# Patient Record
Sex: Female | Born: 1985 | Race: Black or African American | Hispanic: No | Marital: Single | Smoking: Never smoker
Health system: Southern US, Community
[De-identification: ages and names within clinical notes are randomized; demographics above are authoritative.]

## PROBLEM LIST (undated history)

## (undated) ENCOUNTER — Inpatient Hospital Stay (HOSPITAL_COMMUNITY): Payer: Self-pay

## (undated) DIAGNOSIS — Z98891 History of uterine scar from previous surgery: Secondary | ICD-10-CM

## (undated) DIAGNOSIS — I219 Acute myocardial infarction, unspecified: Secondary | ICD-10-CM

## (undated) DIAGNOSIS — I2510 Atherosclerotic heart disease of native coronary artery without angina pectoris: Secondary | ICD-10-CM

## (undated) DIAGNOSIS — I509 Heart failure, unspecified: Secondary | ICD-10-CM

## (undated) DIAGNOSIS — E785 Hyperlipidemia, unspecified: Secondary | ICD-10-CM

## (undated) DIAGNOSIS — I10 Essential (primary) hypertension: Secondary | ICD-10-CM

## (undated) DIAGNOSIS — E039 Hypothyroidism, unspecified: Secondary | ICD-10-CM

## (undated) DIAGNOSIS — O119 Pre-existing hypertension with pre-eclampsia, unspecified trimester: Secondary | ICD-10-CM

## (undated) DIAGNOSIS — E119 Type 2 diabetes mellitus without complications: Secondary | ICD-10-CM

## (undated) DIAGNOSIS — O24419 Gestational diabetes mellitus in pregnancy, unspecified control: Secondary | ICD-10-CM

## (undated) DIAGNOSIS — B009 Herpesviral infection, unspecified: Secondary | ICD-10-CM

## (undated) DIAGNOSIS — O10019 Pre-existing essential hypertension complicating pregnancy, unspecified trimester: Secondary | ICD-10-CM

## (undated) DIAGNOSIS — I252 Old myocardial infarction: Secondary | ICD-10-CM

## (undated) HISTORY — DX: Gestational diabetes mellitus in pregnancy, unspecified control: O24.419

## (undated) HISTORY — PX: NO PAST SURGERIES: SHX2092

---
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
## 2006-04-11 ENCOUNTER — Ambulatory Visit: Payer: Self-pay | Admitting: Family Medicine

## 2006-05-17 ENCOUNTER — Ambulatory Visit: Payer: Self-pay | Admitting: Family Medicine

## 2006-07-15 ENCOUNTER — Ambulatory Visit: Payer: Self-pay | Admitting: Family Medicine

## 2006-10-31 DIAGNOSIS — E282 Polycystic ovarian syndrome: Secondary | ICD-10-CM | POA: Insufficient documentation

## 2006-10-31 DIAGNOSIS — I119 Hypertensive heart disease without heart failure: Secondary | ICD-10-CM | POA: Insufficient documentation

## 2006-12-19 ENCOUNTER — Encounter (INDEPENDENT_AMBULATORY_CARE_PROVIDER_SITE_OTHER): Payer: Self-pay | Admitting: Family Medicine

## 2006-12-19 ENCOUNTER — Ambulatory Visit: Payer: Self-pay | Admitting: Family Medicine

## 2006-12-19 ENCOUNTER — Encounter: Payer: Self-pay | Admitting: *Deleted

## 2006-12-19 DIAGNOSIS — R8789 Other abnormal findings in specimens from female genital organs: Secondary | ICD-10-CM | POA: Insufficient documentation

## 2006-12-19 DIAGNOSIS — E039 Hypothyroidism, unspecified: Secondary | ICD-10-CM | POA: Insufficient documentation

## 2006-12-19 HISTORY — DX: Hypothyroidism, unspecified: E03.9

## 2006-12-19 LAB — CONVERTED CEMR LAB
BUN: 15 mg/dL (ref 6–23)
Beta hcg, urine, semiquantitative: NEGATIVE
Bilirubin Urine: NEGATIVE
CO2: 24 meq/L (ref 19–32)
Calcium: 8.8 mg/dL (ref 8.4–10.5)
Chloride: 103 meq/L (ref 96–112)
Creatinine, Ser: 0.7 mg/dL (ref 0.40–1.20)
Glucose, Bld: 155 mg/dL — ABNORMAL HIGH (ref 70–99)
Glucose, Urine, Semiquant: NEGATIVE
Hgb A1c MFr Bld: 7.3 %
Ketones, urine, test strip: NEGATIVE
Nitrite: NEGATIVE
Potassium: 4.1 meq/L (ref 3.5–5.3)
Protein, U semiquant: NEGATIVE
Sodium: 139 meq/L (ref 135–145)
Specific Gravity, Urine: 1.02
TSH: 3.9 microintl units/mL (ref 0.350–5.50)
Urobilinogen, UA: 2
pH: 7

## 2006-12-23 ENCOUNTER — Telehealth (INDEPENDENT_AMBULATORY_CARE_PROVIDER_SITE_OTHER): Payer: Self-pay | Admitting: *Deleted

## 2006-12-27 ENCOUNTER — Telehealth (INDEPENDENT_AMBULATORY_CARE_PROVIDER_SITE_OTHER): Payer: Self-pay | Admitting: Family Medicine

## 2007-01-21 ENCOUNTER — Encounter: Payer: Self-pay | Admitting: *Deleted

## 2008-06-11 ENCOUNTER — Encounter: Payer: Self-pay | Admitting: Family Medicine

## 2010-10-03 NOTE — Assessment & Plan Note (Signed)
Summary: CPP/PAP/EL   Vital Signs:  Patient Profile:   25 Years Old Female Height:     62.5 inches (158.75 cm) Weight:      360 pounds (163.64 kg) BMI:     65.03 Temp:     97.2 degrees F (36.22 degrees C) Pulse rate:   126 / minute BP sitting:   156 / 94  Pt. in pain?   no  Vitals Entered By: Tomasa Rand (December 19, 2006 1:36 PM)                History of Present Illness: 25 yo with PCOS, DMT2, HTN who presents today for physical and PAP.  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  She never followed up for colposcopy.  She is sexually active and and states that her LMP was 11/13/06.  She admits to unprotected intercourse and has been tested for GC/CHL/ HIV/RPR 07/2006 with neagtive results.  She denies vaginal discharge today, abdominal pain or bleeding.  She was placed on OCP for PCOS and has been noncompliant with them.  DMT2- last AIC 6.4 07/2006.  She was started on metformin 500 mg daily, but has not been taking it consistently.  She was sent for nutrition eval, but still leads sedentary lifestyle.  She has family history of DMT2 in her mother.  PCOS- Elevated testosterone 49 in 07/2006.  DHEA nml. LH: FSH ratio 1.5.  See management initiated as above.   HTN- stable on HCTZ 25.  Needs to start ACE, but unwilling at this time.    Subclinical hypothyroid- Found 07/2006 for amenorrhea work-up.  TSH 8.  Free T4 nml.       Family History:    Mother morbidly obese/ DM  Social History:    Pt dropped out of school.  (C's, D's and 1 F).  Denies tob, etoh, drug use.  Has friends who drink alcohol. Has 2 brothers who live at home.     Risk Factors:  Tobacco use:  never    Physical Exam  General:     overweight-appearing.   Lungs:     Normal respiratory effort, chest expands symmetrically. Lungs are clear to auscultation, no crackles or wheezes. Heart:     Normal rate and regular rhythm. S1 and S2 normal without gallop, murmur, click, rub or other extra sounds.  Distant S1 and  S2 Abdomen:     obese.soft, non-tender, and normal bowel sounds.      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  Pt missed follow up for colpo.  Needs colpo.  GC/ CHL/ HIV/RPR testing normal.  Counselled on importance of follow up to prevent progression of cervical CA.  Counselled regarding use of condoms.  Qualifies for gardisil.  Discuss at next visit.   Orders: [facility]- Est  Level 4 (84696)   Problem # 2:  POLYCYSTIC OVARIAN DISEASE (ICD-256.4) Assessment: Unchanged Needs to take ortho-cylen consistently.  Pt agrees to restart.  States she will incorporate this into her daily routine to not miss doses.  U- preg negative in office today.  Warned of risk of endometrial hyperplasia with not having menses.  Pt voiced understanding.  Cont to encourage weight loss.  Metformin started for PCOS and DM.   Orders: U Preg-[facility] (81025)   Problem # 3:  DM, UNCOMPLICATED, TYPE II (ICD-250.00) Assessment: Deteriorated AIC today 7.3 Increased from 6.4.  Needs to take metformin 500 mg daily as prescribed.  Started low dose to avoid GI side effects.  Likely will need titration.  Told of weight loss side effects with metformin.  Cont to encourage life style modification.  Needs CHOL screen next visit when fasting in future.   Orders: [facility]- Est  Level 4 (99214) A1C-[facility] (44010) Basic Met-[facility] (27253-66440) UA Microalbumin-[facility] (34742)   Problem # 4:  HYPERTENSION, BENIGN (ICD-401.1) Assessment: Deteriorated Will switch HCTZ to ACE next visit.  Will obtain consent to treat since pt has been noncompliant with OCP in past.  Will warn of teratogenic side effects.  Check UA for protein,  if negative microalbumin.  Follow up TSH to assure not contributing.  Will need OSA eval at some point.   Orders: Basic Met-[facility] (59563-87564)   Problem # 5:  HYPOTHYROIDISM, BORDERLINE (ICD-244.9) Assessment: Unchanged TSH 8 / free t4 1.14.   Will recheck TSH today to rule out eusick thyroid since it has been greater than 6 weeks since previous test.   Orders: [facility]- Est  Level 4 (33295) TSH-[facility] (18841-66063)   Problem # 6:  Preventive Health Care (ICD-V70.0) See above.  Qualifies gardisil/ Needs FLP.  Colpo needed.  Seperate visit scheduled for this.    Other Orders: Urinalysis-[facility] (00000)   Patient Instructions: 1)  Please schedule a follow-up appointment in 3 months. 2)  Have Follow up scheduled  for colpo in the colpo clinic prior to next visit.   3)  Take all medicines as prescribed.   4)  Continue weight loss to assist with your blood pressure.  5)  If no improvement prior to next visit, you may require additional BP medicines. 6)  We will contact you with any abnormal results  Laboratory Results   Urine Tests  Date/Time Recieved: December 19, 2006 2:26 PM  Date/Time Reported: December 19, 2006 2:48 PM   Routine Urinalysis   Color: yellow Appearance: Clear Glucose: negative   (Normal Range: Negative) Bilirubin: negative   (Normal Range: Negative) Ketone: negative   (Normal Range: Negative) Spec. Gravity: 1.020   (Normal Range: 1.003-1.035) Blood: trace-lysed   (Normal Range: Negative) pH: 7.0   (Normal Range: 5.0-8.0) Protein: negative   (Normal Range: Negative) Urobilinogen: 2.0   (Normal Range: 0-1) Nitrite: negative   (Normal Range: Negative) Leukocyte Esterace: trace   (Normal Range: Negative)  Urine Microscopic WBC/hpf: 1-5 RBC/hpf: 0-3 Bacteria: 3+ rods Epithelial: 10-15 Other: occ clue cell    Urine HCG: negative Comments: ...................................................................DONNA [facility]  December 19, 2006 2:48 PM   Blood Tests   Date/Time Recieved: December 19, 2006 2:26 PM  Date/Time Reported: December 19, 2006 2:42 PM   HGBA1C: 7.3%   (Normal Range: Non-Diabetic - 3-6%   Control Diabetic - 6-8%)  Comments: ...................................................................DONNA  [facility]  December 19, 2006 2:43 PM

## 2010-10-03 NOTE — Letter (Signed)
Summary: Handout Printed  Printed Handout:  - Family Medicine Patient Instructions 

## 2010-10-03 NOTE — Progress Notes (Signed)
Summary: Colpo appointment  Phone Note Call from Patient Call back at [phone]   Reason for Call: Talk to Nurse Summary of Call: pt is checking the status of her appt with colpo clinic Initial call taken by: ERIN LEVAN,  December 23, 2006 3:26 PM  Follow-up for Phone Call        COLPO APPT SCHEDULED Jan 14, 2007 @ 9:00 AM PT NOTIFIED VIA PHONE AND IS AGREEABLE. ADVISED I WOULD SEND COLPO PROCEDURE INFO/REMINDER APPT CARD VIA MAIL. Follow-up by: Dedra Skeens CMA,,  December 24, 2006 1:36 PM    Appended Document: Colpo appointment Pt [facility] on 01/13/07, needs repeat PAP, not colpo for CIN 1based on new ACOG guidelines and fasting 17 -OH to eval for secondary adrenal hyperplasia at next video.  Maxwell Marion, MD  Appended Document: Colpo appointment Add prolactin level if amenorrhea persist with fasting 17-OH at next visit. Maxwell Marion, MD

## 2010-10-03 NOTE — Assessment & Plan Note (Signed)
Summary: Nurse visit BP check  Nurse Visit   Vital Signs:  Patient Profile:   25 Years Old Female Height:     62.5 inches (158.75 cm) Pulse rate:   102 / minute BP sitting:   150 / 93  (left arm)

## 2014-03-04 LAB — OB RESULTS CONSOLE RUBELLA ANTIBODY, IGM: Rubella: NON-IMMUNE/NOT IMMUNE

## 2014-03-04 LAB — OB RESULTS CONSOLE HIV ANTIBODY (ROUTINE TESTING): HIV: NONREACTIVE

## 2014-03-04 LAB — OB RESULTS CONSOLE HEPATITIS B SURFACE ANTIGEN: Hepatitis B Surface Ag: NEGATIVE

## 2014-03-04 LAB — OB RESULTS CONSOLE RPR: RPR: NONREACTIVE

## 2014-03-04 LAB — OB RESULTS CONSOLE GC/CHLAMYDIA
Chlamydia: NEGATIVE
Gonorrhea: NEGATIVE

## 2014-04-07 ENCOUNTER — Encounter: Payer: Medicaid Other | Attending: Obstetrics and Gynecology

## 2014-04-07 VITALS — Ht 62.0 in | Wt 290.7 lb

## 2014-04-07 DIAGNOSIS — Z713 Dietary counseling and surveillance: Secondary | ICD-10-CM | POA: Insufficient documentation

## 2014-04-07 DIAGNOSIS — E119 Type 2 diabetes mellitus without complications: Secondary | ICD-10-CM | POA: Diagnosis not present

## 2014-04-07 DIAGNOSIS — O24919 Unspecified diabetes mellitus in pregnancy, unspecified trimester: Secondary | ICD-10-CM | POA: Insufficient documentation

## 2014-04-07 DIAGNOSIS — O99810 Abnormal glucose complicating pregnancy: Secondary | ICD-10-CM

## 2014-04-09 NOTE — Progress Notes (Signed)
  Patient was seen on 04/07/14 for Gestational Diabetes self-management class at [facility]. The following learning objectives were met by the patient during this course:   States the definition of Gestational Diabetes  States why dietary management is important in controlling blood glucose  Describes the effects of carbohydrates on blood glucose levels  Demonstrates ability to create a balanced meal plan  Demonstrates carbohydrate counting   States when to check blood glucose levels  Demonstrates proper blood glucose monitoring techniques  States the effect of stress and exercise on blood glucose levels  States the importance of limiting caffeine and abstaining from alcohol and smoking  Plan:  Aim for 2 Carb Choices per meal (30 grams) +/- 1 either way for breakfast Aim for 3 Carb Choices per meal (45 grams) +/- 1 either way from lunch and dinner Aim for 1-2 Carbs per snack Begin reading food labels for Total Carbohydrate and sugar grams of foods Consider  increasing your activity level by walking daily as tolerated Begin checking BG before breakfast and 2 hours after first bit of breakfast, lunch and dinner after  as directed by MD  Take medication  as directed by MD  Blood glucose monitor given: Accu Chek Nano BG Monitoring Kit Lot # [id number] Exp: 12/02/14 Blood glucose reading: $RemoveBeforeDE'239mg'[id number]$ /dl    Patient will modify dietary intake, monitor glucose and see MD Monday 04/12/14. Advised to contact MD office on Friday 04/09/14 if readings remain greater than directed parameters.  Patient instructed to monitor glucose levels: FBS: 60 - <90 2 hour: <120  Patient received the following handouts:  Nutrition Diabetes and Pregnancy  Carbohydrate Counting List  Meal Planning worksheet  Patient will be seen for follow-up as needed.

## 2014-04-30 ENCOUNTER — Encounter (HOSPITAL_COMMUNITY): Payer: Self-pay | Admitting: *Deleted

## 2014-04-30 ENCOUNTER — Ambulatory Visit: Payer: Medicaid Other | Admitting: Internal Medicine

## 2014-04-30 ENCOUNTER — Inpatient Hospital Stay (HOSPITAL_COMMUNITY): Payer: Medicaid Other

## 2014-04-30 ENCOUNTER — Inpatient Hospital Stay (HOSPITAL_COMMUNITY)
Admission: AD | Admit: 2014-04-30 | Discharge: 2014-04-30 | Disposition: A | Discharge status: Home / Self Care | Payer: Medicaid Other | Source: Ambulatory Visit | Attending: Obstetrics and Gynecology | Admitting: Obstetrics and Gynecology

## 2014-04-30 DIAGNOSIS — H538 Other visual disturbances: Secondary | ICD-10-CM | POA: Diagnosis not present

## 2014-04-30 DIAGNOSIS — R220 Localized swelling, mass and lump, head: Secondary | ICD-10-CM | POA: Diagnosis not present

## 2014-04-30 DIAGNOSIS — Z3482 Encounter for supervision of other normal pregnancy, second trimester: Secondary | ICD-10-CM

## 2014-04-30 DIAGNOSIS — O368120 Decreased fetal movements, second trimester, not applicable or unspecified: Secondary | ICD-10-CM

## 2014-04-30 DIAGNOSIS — O9989 Other specified diseases and conditions complicating pregnancy, childbirth and the puerperium: Principal | ICD-10-CM

## 2014-04-30 DIAGNOSIS — R42 Dizziness and giddiness: Secondary | ICD-10-CM | POA: Insufficient documentation

## 2014-04-30 DIAGNOSIS — R221 Localized swelling, mass and lump, neck: Secondary | ICD-10-CM | POA: Insufficient documentation

## 2014-04-30 DIAGNOSIS — O99891 Other specified diseases and conditions complicating pregnancy: Secondary | ICD-10-CM | POA: Diagnosis not present

## 2014-04-30 DIAGNOSIS — R51 Headache: Secondary | ICD-10-CM | POA: Diagnosis not present

## 2014-04-30 LAB — COMPREHENSIVE METABOLIC PANEL
ALT: 6 U/L (ref 0–35)
AST: 8 U/L (ref 0–37)
Albumin: 3 g/dL — ABNORMAL LOW (ref 3.5–5.2)
Alkaline Phosphatase: 66 U/L (ref 39–117)
Anion gap: 12 (ref 5–15)
BUN: 8 mg/dL (ref 6–23)
CO2: 23 mEq/L (ref 19–32)
Calcium: 9.3 mg/dL (ref 8.4–10.5)
Chloride: 97 mEq/L (ref 96–112)
Creatinine, Ser: 0.45 mg/dL — ABNORMAL LOW (ref 0.50–1.10)
GFR calc Af Amer: >90 mL/min (ref >90)
GFR calc non Af Amer: >90 mL/min (ref >90)
Glucose, Bld: 136 mg/dL — ABNORMAL HIGH (ref 70–99)
Potassium: 3.6 mEq/L — ABNORMAL LOW (ref 3.7–5.3)
Sodium: 132 mEq/L — ABNORMAL LOW (ref 137–147)
Total Bilirubin: 0.2 mg/dL — ABNORMAL LOW (ref 0.3–1.2)
Total Protein: 7.7 g/dL (ref 6.0–8.3)

## 2014-04-30 LAB — CBC WITH DIFFERENTIAL/PLATELET
Basophils Absolute: 0 10*3/uL (ref 0.0–0.1)
Basophils Relative: 0 % (ref 0–1)
Eosinophils Absolute: 0 10*3/uL (ref 0.0–0.7)
Eosinophils Relative: 0 % (ref 0–5)
HCT: 33.5 % — ABNORMAL LOW (ref 36.0–46.0)
Hemoglobin: 11.5 g/dL — ABNORMAL LOW (ref 12.0–15.0)
Lymphocytes Relative: 13 % (ref 12–46)
Lymphs Abs: 1.4 10*3/uL (ref 0.7–4.0)
MCH: 29.6 pg (ref 26.0–34.0)
MCHC: 34.3 g/dL (ref 30.0–36.0)
MCV: 86.1 fL (ref 78.0–100.0)
Monocytes Absolute: 0.6 10*3/uL (ref 0.1–1.0)
Monocytes Relative: 5 % (ref 3–12)
Neutro Abs: 9.4 10*3/uL — ABNORMAL HIGH (ref 1.7–7.7)
Neutrophils Relative %: 82 % — ABNORMAL HIGH (ref 43–77)
Platelets: 335 10*3/uL (ref 150–400)
RBC: 3.89 MIL/uL (ref 3.87–5.11)
RDW: 12.7 % (ref 11.5–15.5)
WBC: 11.5 10*3/uL — ABNORMAL HIGH (ref 4.0–10.5)

## 2014-04-30 LAB — LACTATE DEHYDROGENASE: LDH: 134 U/L (ref 94–250)

## 2014-04-30 LAB — PROTEIN / CREATININE RATIO, URINE
Creatinine, Urine: 161.12 mg/dL
Protein Creatinine Ratio: 0.11 (ref 0.00–0.15)
Total Protein, Urine: 17.1 mg/dL

## 2014-04-30 LAB — GLUCOSE, CAPILLARY: Glucose-Capillary: 132 mg/dL — ABNORMAL HIGH (ref 70–99)

## 2014-04-30 LAB — URIC ACID: Uric Acid, Serum: 3.2 mg/dL (ref 2.4–7.0)

## 2014-04-30 MED ORDER — ACETAMINOPHEN 325 MG PO TABS
650.0000 mg | ORAL_TABLET | Freq: Once | ORAL | Status: AC
  Administered 2014-04-30: 650 mg via ORAL
  Filled 2014-04-30: qty 2

## 2014-04-30 NOTE — Discharge Instructions (Signed)

## 2014-04-30 NOTE — MAU Note (Signed)
C/O having a cold x 1 week, has been taking tylenol & sudafed, cough drops - woke up this morning with R side of face swollen & has pressure.  Vision in R eye is blurry.  C/O dizziness anytime she is up & moving.  HA for last 2 days.  Denies pain, bleeding or discharge.

## 2014-04-30 NOTE — MAU Provider Note (Signed)
Results for orders placed during the hospital encounter of 04/30/14 (from the past 24 hour(s))  CBC WITH DIFFERENTIAL     Status: Abnormal   Collection Time    04/30/14  4:34 PM      Result Value Ref Range   WBC 11.5 (*) 4.0 - 10.5 K/uL   RBC 3.89  3.87 - 5.11 MIL/uL   Hemoglobin 11.5 (*) 12.0 - 15.0 g/dL   HCT 33.5 (*) 36.0 - 46.0 %   MCV 86.1  78.0 - 100.0 fL   MCH 29.6  26.0 - 34.0 pg   MCHC 34.3  30.0 - 36.0 g/dL   RDW 12.7  11.5 - 15.5 %   Platelets 335  150 - 400 K/uL   Neutrophils Relative % 82 (*) 43 - 77 %   Neutro Abs 9.4 (*) 1.7 - 7.7 K/uL   Lymphocytes Relative 13  12 - 46 %   Lymphs Abs 1.4  0.7 - 4.0 K/uL   Monocytes Relative 5  3 - 12 %   Monocytes Absolute 0.6  0.1 - 1.0 K/uL   Eosinophils Relative 0  0 - 5 %   Eosinophils Absolute 0.0  0.0 - 0.7 K/uL   Basophils Relative 0  0 - 1 %   Basophils Absolute 0.0  0.0 - 0.1 K/uL  COMPREHENSIVE METABOLIC PANEL     Status: Abnormal   Collection Time    04/30/14  4:34 PM      Result Value Ref Range   Sodium 132 (*) 137 - 147 mEq/L   Potassium 3.6 (*) 3.7 - 5.3 mEq/L   Chloride 97  96 - 112 mEq/L   CO2 23  19 - 32 mEq/L   Glucose, Bld 136 (*) 70 - 99 mg/dL   BUN 8  6 - 23 mg/dL   Creatinine, Ser 0.45 (*) 0.50 - 1.10 mg/dL   Calcium 9.3  8.4 - 10.5 mg/dL   Total Protein 7.7  6.0 - 8.3 g/dL   Albumin 3.0 (*) 3.5 - 5.2 g/dL   AST 8  0 - 37 U/L   ALT 6  0 - 35 U/L   Alkaline Phosphatase 66  39 - 117 U/L   Total Bilirubin 0.2 (*) 0.3 - 1.2 mg/dL   GFR calc non Af Amer >90  >90 mL/min   GFR calc Af Amer >90  >90 mL/min   Anion gap 12  5 - 15  LACTATE DEHYDROGENASE     Status: None   Collection Time    04/30/14  4:34 PM      Result Value Ref Range   LDH 134  94 - 250 U/L  URIC ACID     Status: None   Collection Time    04/30/14  4:34 PM      Result Value Ref Range   Uric Acid, Serum 3.2  2.4 - 7.0 mg/dL  GLUCOSE, CAPILLARY     Status: Abnormal   Collection Time    04/30/14  4:34 PM      Result Value Ref Range    Glucose-Capillary 132 (*) 70 - 99 mg/dL  PROTEIN / CREATININE RATIO, URINE     Status: None   Collection Time    04/30/14  5:45 PM      Result Value Ref Range   Creatinine, Urine 161.12     Total Protein, Urine 17.1     PROTEIN CREATININE RATIO 0.11  0.00 - 0.15   [place] - anterior placenta, FHR 156,  cervix closed, cardiac activity seen, marginal cord insertion  DC to home  FU in the office in 2 week Tylenol for HA   Yarithza Mink, CNM, MSN 04/30/2014. 6:27 PM

## 2014-04-30 NOTE — MAU Provider Note (Signed)
Lauren Delgado is a 28 y.o. G1P0 at 18.2 weeks presents to MAU C/O having a cold x 1 week, has been taking tylenol & sudafed, cough drops - woke up this morning with R side of face swollen & has pressure around the eye. Vision in R eye is blurry. C/O dizziness anytime she is up & moving. HA for last 2 days. Denies pain, bleeding or discharge.  No fetal movement last few days. States she has GDM on 2.5mg  glyburide and GHTN on labetalol 200mg  BID but take her morning dose today.   History     Patient Active Problem List   Diagnosis Date Noted  . HYPOTHYROIDISM, BORDERLINE 12/19/2006  . DM, UNCOMPLICATED, TYPE II 02/27/9484  . ABNORMAL PAP SMEAR, LGSIL 12/19/2006  . Polycystic Ovaries 10/31/2006  . OBESITY, NOS 10/31/2006  . Essential Hypertension, Benign 10/31/2006  . RHINITIS, ALLERGIC 10/31/2006  . AMENORRHEA 10/31/2006    Chief Complaint  Patient presents with  . Facial Swelling  . Dizziness  . Headache  . Blurred Vision   HPI  OB History   Grav Para Term Preterm Abortions TAB SAB Ect Mult Living   1               Past Medical History  Diagnosis Date  . Gestational diabetes mellitus, antepartum     History reviewed. No pertinent past surgical history.  History reviewed. No pertinent family history.  History  Substance Use Topics  . Smoking status: Never Smoker   . Smokeless tobacco: Not on file  . Alcohol Use: No    Allergies: No Known Allergies  Prescriptions prior to admission  Medication Sig Dispense Refill  . acetaminophen (TYLENOL) 500 MG tablet Take 500 mg by mouth every 6 (six) hours as needed for mild pain.      Marland Kitchen glyBURIDE (DIABETA) 2.5 MG tablet Take 2.5 mg by mouth at bedtime.      . hydrocortisone cream 1 % Apply 1 application topically daily as needed (rash).      . labetalol (NORMODYNE) 200 MG tablet Take 200 mg by mouth 2 (two) times daily.      Marland Kitchen omeprazole (PRILOSEC) 20 MG capsule Take 20 mg by mouth 2 (two) times daily as needed  (indigestion).      . Prenatal Multivit-Min-Fe-FA (PRENATAL VITAMINS PO) Take 1 each by mouth daily.      . Pseudoephedrine HCl (SUDAFED PO) Take 2 tablets by mouth every 6 (six) hours as needed (congestion).        ROS See HPI above, all other systems are negative  Physical Exam   Blood pressure 128/84, pulse 105, temperature 98.6 F (37 C), temperature source Oral, resp. rate 20, height 5\' 2"  (1.575 m), weight 284 lb (128.822 kg).  Physical Exam  Ext:  WNL, no edema ABD: Soft, non tender to palpation, no rebound or guarding SVE: deferred   ED Course  Assessment: IUP at  18.2 weeks Membranes:intact FHR: 160 CTX:  none POC CBG 132   Plan: Labs: CBC, CMP, LDH, Uric Acid, PCR UA [place] for viability    Mariyah Upshaw, CNM, MSN 04/30/2014. 4:08 PM

## 2014-05-01 LAB — URINE CULTURE: Colony Count: 50000

## 2014-05-17 ENCOUNTER — Ambulatory Visit: Payer: Medicaid Other | Admitting: Internal Medicine

## 2014-05-22 ENCOUNTER — Inpatient Hospital Stay (HOSPITAL_COMMUNITY): Payer: Medicaid Other

## 2014-05-22 ENCOUNTER — Encounter (HOSPITAL_COMMUNITY): Payer: Self-pay | Admitting: *Deleted

## 2014-05-22 ENCOUNTER — Inpatient Hospital Stay (HOSPITAL_COMMUNITY)
Admission: AD | Admit: 2014-05-22 | Discharge: 2014-05-22 | Disposition: A | Discharge status: Home / Self Care | Payer: Medicaid Other | Source: Ambulatory Visit | Attending: Obstetrics and Gynecology | Admitting: Obstetrics and Gynecology

## 2014-05-22 DIAGNOSIS — O99891 Other specified diseases and conditions complicating pregnancy: Secondary | ICD-10-CM | POA: Insufficient documentation

## 2014-05-22 DIAGNOSIS — R109 Unspecified abdominal pain: Secondary | ICD-10-CM | POA: Insufficient documentation

## 2014-05-22 DIAGNOSIS — O9989 Other specified diseases and conditions complicating pregnancy, childbirth and the puerperium: Secondary | ICD-10-CM

## 2014-05-22 DIAGNOSIS — O10019 Pre-existing essential hypertension complicating pregnancy, unspecified trimester: Secondary | ICD-10-CM | POA: Diagnosis not present

## 2014-05-22 DIAGNOSIS — R8271 Bacteriuria: Secondary | ICD-10-CM | POA: Insufficient documentation

## 2014-05-22 LAB — URINALYSIS, ROUTINE W REFLEX MICROSCOPIC
Bilirubin Urine: NEGATIVE
Glucose, UA: NEGATIVE mg/dL
Ketones, ur: NEGATIVE mg/dL
Nitrite: POSITIVE — AB
Protein, ur: NEGATIVE mg/dL
Specific Gravity, Urine: 1.01 (ref 1.005–1.030)
Urobilinogen, UA: 0.2 mg/dL (ref 0.0–1.0)
pH: 6 (ref 5.0–8.0)

## 2014-05-22 LAB — LIPASE, BLOOD: Lipase: 28 U/L (ref 11–59)

## 2014-05-22 LAB — URINE MICROSCOPIC-ADD ON

## 2014-05-22 LAB — COMPREHENSIVE METABOLIC PANEL
ALT: 11 U/L (ref 0–35)
AST: 11 U/L (ref 0–37)
Albumin: 2.8 g/dL — ABNORMAL LOW (ref 3.5–5.2)
Alkaline Phosphatase: 62 U/L (ref 39–117)
Anion gap: 13 (ref 5–15)
BUN: 7 mg/dL (ref 6–23)
CO2: 22 mEq/L (ref 19–32)
Calcium: 9.4 mg/dL (ref 8.4–10.5)
Chloride: 101 mEq/L (ref 96–112)
Creatinine, Ser: 0.53 mg/dL (ref 0.50–1.10)
GFR calc Af Amer: >90 mL/min (ref >90)
GFR calc non Af Amer: >90 mL/min (ref >90)
Glucose, Bld: 105 mg/dL — ABNORMAL HIGH (ref 70–99)
Potassium: 3.4 mEq/L — ABNORMAL LOW (ref 3.7–5.3)
Sodium: 136 mEq/L — ABNORMAL LOW (ref 137–147)
Total Bilirubin: 0.3 mg/dL (ref 0.3–1.2)
Total Protein: 7.4 g/dL (ref 6.0–8.3)

## 2014-05-22 LAB — PROTEIN / CREATININE RATIO, URINE
Creatinine, Urine: 116.1 mg/dL
Protein Creatinine Ratio: 0.23 — ABNORMAL HIGH (ref 0.00–0.15)
Total Protein, Urine: 26.8 mg/dL

## 2014-05-22 LAB — CBC
HCT: 30.2 % — ABNORMAL LOW (ref 36.0–46.0)
Hemoglobin: 10.2 g/dL — ABNORMAL LOW (ref 12.0–15.0)
MCH: 29.2 pg (ref 26.0–34.0)
MCHC: 33.8 g/dL (ref 30.0–36.0)
MCV: 86.5 fL (ref 78.0–100.0)
Platelets: 304 10*3/uL (ref 150–400)
RBC: 3.49 MIL/uL — ABNORMAL LOW (ref 3.87–5.11)
RDW: 13.5 % (ref 11.5–15.5)
WBC: 8.3 10*3/uL (ref 4.0–10.5)

## 2014-05-22 LAB — AMYLASE: Amylase: 70 U/L (ref 0–105)

## 2014-05-22 LAB — MAGNESIUM: Magnesium: 1.7 mg/dL (ref 1.5–2.5)

## 2014-05-22 LAB — URIC ACID: Uric Acid, Serum: 4.1 mg/dL (ref 2.4–7.0)

## 2014-05-22 LAB — LACTATE DEHYDROGENASE: LDH: 126 U/L (ref 94–250)

## 2014-05-22 MED ORDER — NITROFURANTOIN MONOHYD MACRO 100 MG PO CAPS: 100.0000 mg | ORAL_CAPSULE | Freq: Two times a day (BID) | ORAL | Status: AC

## 2014-05-22 NOTE — MAU Note (Signed)
Having really bad cramping right side back and stomach starting last night around 7.  Did not take any medications.  Denies bleeding, leaking of fluid, abnormal discharge.

## 2014-05-22 NOTE — Discharge Instructions (Signed)
Gestational Diabetes Mellitus Gestational diabetes mellitus, often simply referred to as gestational diabetes, is a type of diabetes that some women develop during pregnancy. In gestational diabetes, the pancreas does not make enough insulin (a hormone), the cells are less responsive to the insulin that is made (insulin resistance), or both.Normally, insulin moves sugars from food into the tissue cells. The tissue cells use the sugars for energy. The lack of insulin or the lack of normal response to insulin causes excess sugars to build up in the blood instead of going into the tissue cells. As a result, high blood sugar (hyperglycemia) develops. The effect of high sugar (glucose) levels can cause many problems.  RISK FACTORS You have an increased chance of developing gestational diabetes if you have a family history of diabetes and also have one or more of the following risk factors:  A body mass index over 30 (obesity).  A previous pregnancy with gestational diabetes.  An older age at the time of pregnancy. If blood glucose levels are kept in the normal range during pregnancy, women can have a healthy pregnancy. If your blood glucose levels are not well controlled, there may be risks to you, your unborn baby (fetus), your labor and delivery, or your newborn baby.  SYMPTOMS  If symptoms are experienced, they are much like symptoms you would normally expect during pregnancy. The symptoms of gestational diabetes include:   Increased thirst (polydipsia).  Increased urination (polyuria).  Increased urination during the night (nocturia).  Weight loss. This weight loss may be rapid.  Frequent, recurring infections.  Tiredness (fatigue).  Weakness.  Vision changes, such as blurred vision.  Fruity smell to your breath.  Abdominal pain. DIAGNOSIS Diabetes is diagnosed when blood glucose levels are increased. Your blood glucose level may be checked by one or more of the following blood  tests:  A fasting blood glucose test. You will not be allowed to eat for at least 8 hours before a blood sample is taken.  A random blood glucose test. Your blood glucose is checked at any time of the day regardless of when you ate.  A hemoglobin A1c blood glucose test. A hemoglobin A1c test provides information about blood glucose control over the previous 3 months.  An oral glucose tolerance test (OGTT). Your blood glucose is measured after you have not eaten (fasted) for 1-3 hours and then after you drink a glucose-containing beverage. Since the hormones that cause insulin resistance are highest at about 24-28 weeks of a pregnancy, an OGTT is usually performed during that time. If you have risk factors for gestational diabetes, your health care provider may test you for gestational diabetes earlier than 24 weeks of pregnancy. TREATMENT   You will need to take diabetes medicine or insulin daily to keep blood glucose levels in the desired range.  You will need to match insulin dosing with exercise and healthy food choices. The treatment goal is to maintain the before-meal (preprandial), bedtime, and overnight blood glucose level at 60-99 mg/dL during pregnancy. The treatment goal is to further maintain peak after-meal blood sugar (postprandial glucose) level at 100-140 mg/dL. HOME CARE INSTRUCTIONS   Have your hemoglobin A1c level checked twice a year.  Perform daily blood glucose monitoring as directed by your health care provider. It is common to perform frequent blood glucose monitoring.  Monitor urine ketones when you are ill and as directed by your health care provider.  Take your diabetes medicine and insulin as directed by your health care provider  to maintain your blood glucose level in the desired range.  Never run out of diabetes medicine or insulin. It is needed every day.  Adjust insulin based on your intake of carbohydrates. Carbohydrates can raise blood glucose levels but  need to be included in your diet. Carbohydrates provide vitamins, minerals, and fiber which are an essential part of a healthy diet. Carbohydrates are found in fruits, vegetables, whole grains, dairy products, legumes, and foods containing added sugars.  Eat healthy foods. Alternate 3 meals with 3 snacks.  Maintain a healthy weight gain. The usual total expected weight gain varies according to your prepregnancy body mass index (BMI).  Carry a medical alert card or wear your medical alert jewelry.  Carry a 15-gram carbohydrate snack with you at all times to treat low blood glucose (hypoglycemia). Some examples of 15-gram carbohydrate snacks include:  Glucose tablets, 3 or 4.  Glucose gel, 15-gram tube.  Raisins, 2 tablespoons (24 g).  Jelly beans, 6.  Animal crackers, 8.  Fruit juice, regular soda, or low-fat milk, 4 ounces (120 mL).  Gummy treats, 9.  Recognize hypoglycemia. Hypoglycemia during pregnancy occurs with blood glucose levels of 60 mg/dL and below. The risk for hypoglycemia increases when fasting or skipping meals, during or after intense exercise, and during sleep. Hypoglycemia symptoms can include:  Tremors or shakes.  Decreased ability to concentrate.  Sweating.  Increased heart rate.  Headache.  Dry mouth.  Hunger.  Irritability.  Anxiety.  Restless sleep.  Altered speech or coordination.  Confusion.  Treat hypoglycemia promptly. If you are alert and able to safely swallow, follow the 15:15 rule:  Take 15-20 grams of rapid-acting glucose or carbohydrate. Rapid-acting options include glucose gel, glucose tablets, or 4 ounces (120 mL) of fruit juice, regular soda, or low-fat milk.  Check your blood glucose level 15 minutes after taking the glucose.  Take 15-20 grams more of glucose if the repeat blood glucose level is still 70 mg/dL or below.  Eat a meal or snack within 1 hour once blood glucose levels return to normal.  Be alert to polyuria  (excess urination) and polydipsia (excess thirst) which are early signs of hyperglycemia. An early awareness of hyperglycemia allows for prompt treatment. Treat hyperglycemia as directed by your health care provider.  Engage in at least 30 minutes of physical activity a day or as directed by your health care provider. Ten minutes of physical activity timed 30 minutes after each meal is encouraged to control postprandial blood glucose levels.  Adjust your insulin dosing and food intake as needed if you start a new exercise or sport.  Follow your sick-day plan at any time you are unable to eat or drink as usual.  Avoid tobacco and alcohol use.  Keep all follow-up visits as directed by your health care provider.  Follow the advice of your health care provider regarding your prenatal and post-delivery (postpartum) appointments, meal planning, exercise, medicines, vitamins, blood tests, other medical tests, and physical activities.  Perform daily skin and foot care. Examine your skin and feet daily for cuts, bruises, redness, nail problems, bleeding, blisters, or sores.  Brush your teeth and gums at least twice a day and floss at least once a day. Follow up with your dentist regularly.  Schedule an eye exam during the first trimester of your pregnancy or as directed by your health care provider.  Share your diabetes management plan with your workplace or school.  Stay up-to-date with immunizations.  Learn to manage stress.  Obtain ongoing diabetes education and support as needed.  Learn about and consider breastfeeding your baby.  You should have your blood sugar level checked 6-12 weeks after delivery. This is done with an oral glucose tolerance test (OGTT). SEEK MEDICAL CARE IF:   You are unable to eat food or drink fluids for more than 6 hours.  You have nausea and vomiting for more than 6 hours.  You have a blood glucose level of 200 mg/dL and you have ketones in your  urine.  There is a change in mental status.  You develop vision problems.  You have a persistent headache.  You have upper abdominal pain or discomfort.  You develop an additional serious illness.  You have diarrhea for more than 6 hours.  You have been sick or have had a fever for a couple of days and are not getting better. SEEK IMMEDIATE MEDICAL CARE IF:   You have difficulty breathing.  You no longer feel the baby moving.  You are bleeding or have discharge from your vagina.  You start having premature contractions or labor. MAKE SURE YOU:  Understand these instructions.  Will watch your condition.  Will get help right away if you are not doing well or get worse. Document Released: 11/26/2000 Document Revised: 01/04/2014 Document Reviewed: 03/18/2012 [facility] Patient Information 2015 [place], [place]. This information is not intended to replace advice given to you by your health care provider. Make sure you discuss any questions you have with your health care provider. Hypertension During Pregnancy Hypertension, or high blood pressure, is when there is extra pressure inside your blood vessels that carry blood from the heart to the rest of your body (arteries). It can happen at any time in life, including pregnancy. Hypertension during pregnancy can cause problems for you and your baby. Your baby might not weigh as much as he or she should at birth or might be born early (premature). Very bad cases of hypertension during pregnancy can be life-threatening.  Different types of hypertension can occur during pregnancy. These include:  Chronic hypertension. This happens when a woman has hypertension before pregnancy and it continues during pregnancy.  Gestational hypertension. This is when hypertension develops during pregnancy.  Preeclampsia or toxemia of pregnancy. This is a very serious type of hypertension that develops only during pregnancy. It affects the whole body and  can be very dangerous for both mother and baby.  Gestational hypertension and preeclampsia usually go away after your baby is born. Your blood pressure will likely stabilize within 6 weeks. Women who have hypertension during pregnancy have a greater chance of developing hypertension later in life or with future pregnancies. RISK FACTORS There are certain factors that make it more likely for you to develop hypertension during pregnancy. These include:  Having hypertension before pregnancy.  Having hypertension during a previous pregnancy.  Being overweight.  Being older than 40 years.  Being pregnant with more than one baby.  Having diabetes or kidney problems. SIGNS AND SYMPTOMS Chronic and gestational hypertension rarely cause symptoms. Preeclampsia has symptoms, which may include:  Increased protein in your urine. Your health care provider will check for this at every prenatal visit.  Swelling of your hands and face.  Rapid weight gain.  Headaches.  Visual changes.  Being bothered by light.  Abdominal pain, especially in the upper right area.  Chest pain.  Shortness of breath.  Increased reflexes.  Seizures. These occur with a more severe form of preeclampsia, called eclampsia. DIAGNOSIS  You may be diagnosed with hypertension during a regular prenatal exam. At each prenatal visit, you may have:  Your blood pressure checked.  A urine test to check for protein in your urine. The type of hypertension you are diagnosed with depends on when you developed it. It also depends on your specific blood pressure reading.  Developing hypertension before 20 weeks of pregnancy is consistent with chronic hypertension.  Developing hypertension after 20 weeks of pregnancy is consistent with gestational hypertension.  Hypertension with increased urinary protein is diagnosed as preeclampsia.  Blood pressure measurements that stay above 782 systolic or 956 diastolic are a sign of  severe preeclampsia. TREATMENT Treatment for hypertension during pregnancy varies. Treatment depends on the type of hypertension and how serious it is.  If you take medicine for chronic hypertension, you may need to switch medicines.  Medicines called ACE inhibitors should not be taken during pregnancy.  Low-dose aspirin may be suggested for women who have risk factors for preeclampsia.  If you have gestational hypertension, you may need to take a blood pressure medicine that is safe during pregnancy. Your health care provider will recommend the correct medicine.  If you have severe preeclampsia, you may need to be in the hospital. Health care providers will watch you and your baby very closely. You also may need to take medicine called magnesium sulfate to prevent seizures and lower blood pressure.  Sometimes, an early delivery is needed. This may be the case if the condition worsens. It would be done to protect you and your baby. The only cure for preeclampsia is delivery.  Your health care provider may recommend that you take one low-dose aspirin (81 mg) each day to help prevent high blood pressure during your pregnancy if you are at risk for preeclampsia. You may be at risk for preeclampsia if:  You had preeclampsia or eclampsia during a previous pregnancy.  Your baby did not grow as expected during a previous pregnancy.  You experienced preterm birth with a previous pregnancy.  You experienced a separation of the placenta from the uterus (placental abruption) during a previous pregnancy.  You experienced the loss of your baby during a previous pregnancy.  You are pregnant with more than one baby.  You have other medical conditions, such as diabetes or an autoimmune disease. HOME CARE INSTRUCTIONS  Schedule and keep all of your regular prenatal care appointments. This is important.  Take medicines only as directed by your health care provider. Tell your health care provider  about all medicines you take.  Eat as little salt as possible.  Get regular exercise.  Do not drink alcohol.  Do not use tobacco products.  Do not drink products with caffeine.  Lie on your left side when resting. SEEK IMMEDIATE MEDICAL CARE IF:  You have severe abdominal pain.  You have sudden swelling in your hands, ankles, or face.  You gain 4 pounds (1.8 kg) or more in 1 week.  You vomit repeatedly.  You have vaginal bleeding.  You do not feel your baby moving as much.  You have a headache.  You have blurred or double vision.  You have muscle twitching or spasms.  You have shortness of breath.  You have blue fingernails or lips.  You have blood in your urine. MAKE SURE YOU:  Understand these instructions.  Will watch your condition.  Will get help right away if you are not doing well or get worse. Document Released: 05/08/2011 Document Revised: 01/04/2014 Document Reviewed:  03/19/2013 [employer] Patient Information 2015 [place], [place]. This information is not intended to replace advice given to you by your health care provider. Make sure you discuss any questions you have with your health care provider. Asymptomatic Bacteriuria Asymptomatic bacteriuria is the presence of a large number of bacteria in your urine without the usual symptoms of burning or frequent urination. The following conditions increase the risk of asymptomatic bacteriuria:  Diabetes mellitus.  Advanced age.  Pregnancy in the first trimester.  Kidney stones.  Kidney transplants.  Leaky kidney tube valve in young children (reflux). Treatment for this condition is not needed in most people and can lead to other problems such as too much yeast and growth of resistant bacteria. However, some people, such as pregnant women, do need treatment to prevent kidney infection. Asymptomatic bacteriuria in pregnancy is also associated with fetal growth restriction, premature labor, and newborn  death. HOME CARE INSTRUCTIONS Monitor your condition for any changes. The following actions may help to relieve any discomfort you are feeling:  Drink enough water and fluids to keep your urine clear or pale yellow. Go to the bathroom more often to keep your bladder empty.  Keep the area around your vagina and rectum clean. Wipe yourself from front to back after urinating. SEEK IMMEDIATE MEDICAL CARE IF:  You develop signs of an infection such as:  Burning with urination.  Frequency of voiding.  Back pain.  Fever.  You have blood in the urine.  You develop a fever. MAKE SURE YOU:  Understand these instructions.  Will watch your condition.  Will get help right away if you are not doing well or get worse. Document Released: 08/20/2005 Document Revised: 01/04/2014 Document Reviewed: 02/09/2013 [facility] Patient Information 2015 [place], [place]. This information is not intended to replace advice given to you by your health care provider. Make sure you discuss any questions you have with your health care provider.

## 2014-05-22 NOTE — [facility] Provider Note (Signed)
History   Patient is a 28y.o. G1P0 who presents, unannounced, complaining of back and stomach pain.  Patient reports onset yesterday at 7pm and states pain is intermittent.  Patient also reports nausea/vomiting and "pain under my right breast," prior to the onset of symptoms.  Patient denies usage of medications for pain mgmt and reports adequate hydration and nutrition.  Patient denies issues with urination or bowel movements as well as history of gallbladder or appendix issues.   Patient also reports history of HTN and states recent onset of numbness and tingling in her hands, but denies HA or visual disturbances.  Patient currently taking Labetalol 200mg  at HS and reports last dose yesterday.   Patient reports fetal movement and denies LOF and VB.    Patient Active Problem List   Diagnosis Date Noted  . HYPOTHYROIDISM, BORDERLINE 12/19/2006  . DM, UNCOMPLICATED, TYPE II 35/00/9381  . ABNORMAL PAP SMEAR, LGSIL 12/19/2006  . Polycystic Ovaries 10/31/2006  . OBESITY, NOS 10/31/2006  . Essential Hypertension, Benign 10/31/2006  . RHINITIS, ALLERGIC 10/31/2006  . AMENORRHEA 10/31/2006    Chief Complaint  Patient presents with  . Abdominal Pain   HPI  OB History   Grav Para Term Preterm Abortions TAB SAB Ect Mult Living   1               Past Medical History  Diagnosis Date  . Gestational diabetes mellitus, antepartum     Past Surgical History  Procedure Laterality Date  . No past surgeries      No family history on file.  History  Substance Use Topics  . Smoking status: Never Smoker   . Smokeless tobacco: Not on file  . Alcohol Use: No    Allergies: No Known Allergies  Prescriptions prior to admission  Medication Sig Dispense Refill  . acetaminophen (TYLENOL) 500 MG tablet Take 500 mg by mouth every 6 (six) hours as needed for mild pain.      Marland Kitchen glyBURIDE (DIABETA) 2.5 MG tablet Take 2.5 mg by mouth at bedtime.      . hydrocortisone cream 1 % Apply 1 application  topically daily as needed (rash).      . labetalol (NORMODYNE) 200 MG tablet Take 200 mg by mouth 2 (two) times daily.      Marland Kitchen omeprazole (PRILOSEC) 20 MG capsule Take 20 mg by mouth 2 (two) times daily as needed (indigestion).      . Prenatal Multivit-Min-Fe-FA (PRENATAL VITAMINS PO) Take 1 each by mouth daily.      . Pseudoephedrine HCl (SUDAFED PO) Take 2 tablets by mouth every 6 (six) hours as needed (congestion).        ROS  See HPI Above Physical Exam   Blood pressure 144/92, pulse 96, temperature 98.1 F (36.7 C), temperature source Oral, resp. rate 18, height 5\' 2"  (1.575 m), weight 293 lb 3.2 oz (132.995 kg). Results for orders placed during the hospital encounter of 05/22/14 (from the past 24 hour(s))  URINALYSIS, ROUTINE W REFLEX MICROSCOPIC     Status: Abnormal   Collection Time    05/22/14  5:33 PM      Result Value Ref Range   Color, Urine YELLOW  YELLOW   APPearance CLOUDY (*) CLEAR   Specific Gravity, Urine 1.010  1.005 - 1.030   pH 6.0  5.0 - 8.0   Glucose, UA NEGATIVE  NEGATIVE mg/dL   Hgb urine dipstick SMALL (*) NEGATIVE   Bilirubin Urine NEGATIVE  NEGATIVE   Ketones,  ur NEGATIVE  NEGATIVE mg/dL   Protein, ur NEGATIVE  NEGATIVE mg/dL   Urobilinogen, UA 0.2  0.0 - 1.0 mg/dL   Nitrite POSITIVE (*) NEGATIVE   Leukocytes, UA LARGE (*) NEGATIVE  URINE MICROSCOPIC-ADD ON     Status: Abnormal   Collection Time    05/22/14  5:33 PM      Result Value Ref Range   Squamous Epithelial / LPF FEW (*) RARE   WBC, UA TOO NUMEROUS TO COUNT  <3 WBC/hpf   RBC / HPF 3-6  <3 RBC/hpf   Bacteria, UA MANY (*) RARE     Physical Exam  Constitutional: She is cooperative. She does not have a sickly appearance. No distress.  Patient appears morbidly obese.  BP elevated.  Cardiovascular: Normal rate, regular rhythm and normal heart sounds.   Respiratory: Effort normal and breath sounds normal.  GI: Soft. Bowel sounds are normal. She exhibits no mass. There is tenderness in the right  upper quadrant, right lower quadrant and epigastric area. There is guarding and positive Murphy's sign. There is no rigidity, no rebound and no CVA tenderness.  Genitourinary:  Deferred  Neurological: She is alert.  Skin: Skin is warm and dry.   FHR: 155 by doppler  ED Course  Assessment: IUP at 21.3wks Asymptomatic Bacteruria  Essential HTN Right Side Pain  Plan: -Labs: UA-Abnormal, UC-Pending, PIH-Pending -Discussed UA Findings and treatment -Abdominal US   Follow Up (2115) -Dr. Chauncey Cruel. Rivard updated on POC and patient status.  Instructed to increase patient Labetalol to BID.  Follow Up (2235) -Discussed [place] Results-WNL -Discussed need for medication compliance as after reviewing medication log, via [employer], patient is suppose to be taking Labetalol BID since 8/27.  Also states this on bottle! -Discussed patient high risk status including weight, GDM, HTN, and ASB and effects on pregnancy and fetal development -Patient and mother express frustration re; practice and discussed need for proper mgmt at home -Patient without future appt and informed that she will be contacted on Monday to schedule for same week appt with [place] -Patient appears tearful and withdrawn during conversation -Patient declines medication for pain mgmt -Patient encouraged to call if you have any questions or concerns prior to your next visit.  -Patient given OOW excuse for tonight and tomorrow.  Othella Slappey Sloan Leiter, MSN 05/22/2014 7:31 PM

## 2014-05-25 LAB — CULTURE, OB URINE: Colony Count: >=100000

## 2014-06-03 LAB — HM DIABETES EYE EXAM

## 2014-06-07 ENCOUNTER — Encounter: Payer: Self-pay | Admitting: Internal Medicine

## 2014-06-07 ENCOUNTER — Ambulatory Visit (INDEPENDENT_AMBULATORY_CARE_PROVIDER_SITE_OTHER): Payer: Medicaid Other | Admitting: Internal Medicine

## 2014-06-07 VITALS — BP 128/76 | HR 98 | Temp 98.0°F | Resp 12 | Ht 62.0 in | Wt 294.0 lb

## 2014-06-07 DIAGNOSIS — O24419 Gestational diabetes mellitus in pregnancy, unspecified control: Secondary | ICD-10-CM

## 2014-06-07 DIAGNOSIS — O24112 Pre-existing diabetes mellitus, type 2, in pregnancy, second trimester: Secondary | ICD-10-CM

## 2014-06-07 HISTORY — DX: Gestational diabetes mellitus in pregnancy, unspecified control: O24.419

## 2014-06-07 NOTE — Patient Instructions (Signed)
Lease change the Glyburide to 2.5 mg before b'fast and before dinner.  Please return in 1-1.5 month with your sugar log.   Targets for pregnancy: - fasting: <95 - 1h after you start eating: <140 - 2h after you start eating: <120  PATIENT INSTRUCTIONS FOR TYPE 2 DIABETES:  DIET AND EXERCISE Diet and exercise is an important part of diabetic treatment.  We recommended aerobic exercise in the form of brisk walking (working between 40-60% of maximal aerobic capacity, similar to brisk walking) for 150 minutes per week (such as 30 minutes five days per week) along with 3 times per week performing 'resistance' training (using various gauge rubber tubes with handles) 5-10 exercises involving the major muscle groups (upper body, lower body and core) performing 10-15 repetitions (or near fatigue) each exercise. Start at half the above goal but build slowly to reach the above goals. If limited by weight, joint pain, or disability, we recommend daily walking in a swimming pool with water up to waist to reduce pressure from joints while allow for adequate exercise.    BLOOD GLUCOSES Monitoring your blood glucoses is important for continued management of your diabetes. Please check your blood glucoses 2-4 times a day: fasting, before meals and at bedtime (you can rotate these measurements - e.g. one day check before the 3 meals, the next day check before 2 of the meals and before bedtime, etc.).   HYPOGLYCEMIA (low blood sugar) Hypoglycemia is usually a reaction to not eating, exercising, or taking too much insulin/ other diabetes drugs.  Symptoms include tremors, sweating, hunger, confusion, headache, etc. Treat IMMEDIATELY with 15 grams of Carbs:   4 glucose tablets    cup regular juice/soda   2 tablespoons raisins   4 teaspoons sugar   1 tablespoon honey Recheck blood glucose in 15 mins and repeat above if still symptomatic/blood glucose <100.  RECOMMENDATIONS TO REDUCE YOUR RISK OF DIABETIC  COMPLICATIONS: * Take your prescribed MEDICATION(S) * Follow a DIABETIC diet: Complex carbs, fiber rich foods, (monounsaturated and polyunsaturated) fats * AVOID saturated/trans fats, high fat foods, >2,300 mg salt per day. * EXERCISE at least 5 times a week for 30 minutes or preferably daily.  * DO NOT SMOKE OR DRINK more than 1 drink a day. * Check your FEET every day. Do not wear tightfitting shoes. Contact us if you develop an ulcer * See your EYE doctor once a year or more if needed * Get a FLU shot once a year * Get a PNEUMONIA vaccine once before and once after age 72 years  GOALS:  * Your Hemoglobin A1c of <7%  * fasting sugars need to be <130 * after meals sugars need to be <180 (2h after you start eating) * Your Systolic BP should be 812 or lower  * Your Diastolic BP should be 80 or lower  * Your HDL (Good Cholesterol) should be 40 or higher  * Your LDL (Bad Cholesterol) should be 100 or lower. * Your Triglycerides should be 150 or lower  * Your Urine microalbumin (kidney function) should be <30 * Your Body Mass Index should be 25 or lower

## 2014-06-07 NOTE — Progress Notes (Signed)
Patient ID: Lauren Delgado, female   DOB: 1985-11-11, 28 y.o.   MRN: [id number]  HPI: Lauren Delgado is a 28 y.o.-year-old female, referred by Dr. Cletis Media Harle Delgado), for management of DM, non-insulin-dependent, detected during pregnancy, but pre-existent per review of chart (HbA1c in 2008 was 7.3%). She is now [redacted] weeks pregnant.  Patient has been told she had diabetes 03/25/2014, at 15 weeks in the current pregnancy; she has not been on insulin before.   Last hemoglobin A1c was: 05/05/2014: 8.9% Lab Results  Component Value Date   HGBA1C 7.3 12/19/2006  She stopped going to the Dr in 2008 as she lost her insurance. Now has M'aid.   Pt is on a regimen of: - Glyburide 2.5 mg at bedtime on 04/08/2014 >> started 2.5 mg bid (am and bedtime) in 05/2014 (1-2 weeks ago)  Her pregnancy is complicated by obesity and HTN.  Pt checks her sugars 2x a day and they are: - am: 65-97 - 2h after b'fast: n/c - before lunch: 125 - 1h after lunch: 108-140 - before dinner: n/c - right after dinner- bedtime: 93-143, 157 - nighttime: No lows. Lowest sugar was 65; ? hypoglycemia awareness. Highest sugar was 157.  Works at night >> comes home at [employer] am  Pt's meals are: - Breakfast: cereals (wheat) + 2% milk - Lunch: a sandwich - Dinner: chicken, cheese, spinach - Snacks:  - no CKD, last BUN/creatinine:  Lab Results  Component Value Date   BUN 7 05/22/2014   CREATININE 0.53 05/22/2014   - last set of lipids: - last eye exam was in 06/03/2014. No DR.  - + occasional numbness  in her feet.  Pt has FH of DM in mother, father.  ROS: Constitutional: no weight gain/loss, + increased appetite, no fatigue, + subjective hypothermia, + nocturia x1 Eyes: no blurry vision, no xerophthalmia ENT: no sore throat, no nodules palpated in throat, no dysphagia/odynophagia, no hoarseness Cardiovascular: no CP/SOB/palpitations/leg swelling Respiratory: no cough/SOB Gastrointestinal: + heartburn/+ N/+ V/no  D/C Musculoskeletal: no muscle/joint aches Skin: no rashes, + hair loss Neurological: no tremors/numbness/tingling/dizziness Psychiatric: no depression/anxiety + low libido  Past Medical History  Diagnosis Date  . Gestational diabetes mellitus, antepartum    Past Surgical History  Procedure Laterality Date  . No past surgeries     History   Social History  . Marital Status: Single    Spouse Name: N/A    Number of Children: 0   Occupational History  . retail   Social History Main Topics  . Smoking status: Never Smoker   . Smokeless tobacco: No  . Alcohol Use: No  . Drug Use: No   Current Outpatient Prescriptions on File Prior to Visit  Medication Sig Dispense Refill  . glyBURIDE (DIABETA) 2.5 MG tablet Take 2.5 mg by mouth 2 (two) times daily.       . hydrocortisone cream 1 % Apply 1 application topically daily as needed (rash).      . labetalol (NORMODYNE) 200 MG tablet Take 200 mg by mouth 2 (two) times daily.      Marland Kitchen omeprazole (PRILOSEC) 20 MG capsule Take 20 mg by mouth 2 (two) times daily as needed (indigestion).      . Prenatal Multivit-Min-Fe-FA (PRENATAL VITAMINS PO) Take 1 each by mouth daily.      Marland Kitchen acetaminophen (TYLENOL) 500 MG tablet Take 500 mg by mouth every 6 (six) hours as needed for mild pain.      . Pseudoephedrine HCl (SUDAFED PO)  Take 2 tablets by mouth every 6 (six) hours as needed (congestion).       No current facility-administered medications on file prior to visit.   No Known Allergies  FH: - DM, HTN, HL: M and F - heart ds. In M - thyroid ds. In M  PE: BP 128/76  Pulse 98  Temp(Src) 98 F (36.7 C) (Oral)  Resp 12  Ht 5\' 2"  (1.575 m)  Wt 294 lb (133.358 kg)  BMI 53.76 kg/m2  SpO2 95% Wt Readings from Last 3 Encounters:  06/07/14 294 lb (133.358 kg)  05/22/14 293 lb 3.2 oz (132.995 kg)  04/30/14 284 lb (128.822 kg)   Constitutional: obese, in NAD Eyes: PERRLA, EOMI, no exophthalmos ENT: moist mucous membranes, no thyromegaly,  no cervical lymphadenopathy Cardiovascular: RRR, No MRG Respiratory: CTA B Gastrointestinal: abdomen soft, NT, ND, BS+ Musculoskeletal: no deformities, strength intact in all 4 Skin: moist, warm, no rashes Neurological: no tremor with outstretched hands, DTR normal in all 4  ASSESSMENT: 1. DM2, now in second trimester of pregnancy, non-insulin-dependent, uncontrolled, without complications  PLAN:  1. Patient with long-standing diabetes, on oral antidiabetic regimen, which works well for her - We discussed about options for treatment (insulin; we may also add Metformin if needed), but her control is good on the Glyburide, so we decided to continue it but move the second dose before dinner. Patient Instructions  Lease change the Glyburide to 2.5 mg before b'fast and before dinner.  Please return in 1-1.5 month with your sugar log.   Targets for pregnancy: - fasting: <95 - 1h after you start eating: <140 - 2h after you start eating: <120 - we discussed about target CBGs in pregnancy and she meets them (see above) - continue checking sugars at different times of the day - check at least 4 times a day, rotating checks - given sugar log and advised how to fill it and to bring it at next appt  - given foot care handout and explained the principles  - given instructions for hypoglycemia management "15-15 rule"  - advised for yearly eye exams, she is UTD - Return to clinic in 1.5 mo with sugar log, but let me know about the sugars in ~3 weeks.

## 2014-06-18 ENCOUNTER — Other Ambulatory Visit: Payer: Self-pay

## 2014-07-05 ENCOUNTER — Encounter: Payer: Self-pay | Admitting: Internal Medicine

## 2014-07-19 ENCOUNTER — Ambulatory Visit: Payer: Medicaid Other | Admitting: Internal Medicine

## 2014-08-25 ENCOUNTER — Encounter (HOSPITAL_COMMUNITY): Payer: Self-pay | Admitting: *Deleted

## 2014-08-25 ENCOUNTER — Inpatient Hospital Stay (HOSPITAL_COMMUNITY)
Admission: AD | Admit: 2014-08-25 | Discharge: 2014-08-27 | DRG: 781 | Disposition: A | Discharge status: Home / Self Care | Payer: Medicaid Other | Source: Ambulatory Visit | Attending: Obstetrics and Gynecology | Admitting: Obstetrics and Gynecology

## 2014-08-25 ENCOUNTER — Ambulatory Visit (HOSPITAL_COMMUNITY)
Admit: 2014-08-25 | Discharge: 2014-08-25 | Disposition: A | Discharge status: Home / Self Care | Payer: Medicaid Other | Attending: Obstetrics & Gynecology | Admitting: Obstetrics & Gynecology

## 2014-08-25 DIAGNOSIS — Z8261 Family history of arthritis: Secondary | ICD-10-CM

## 2014-08-25 DIAGNOSIS — Z8249 Family history of ischemic heart disease and other diseases of the circulatory system: Secondary | ICD-10-CM

## 2014-08-25 DIAGNOSIS — O99820 Streptococcus B carrier state complicating pregnancy: Secondary | ICD-10-CM | POA: Diagnosis present

## 2014-08-25 DIAGNOSIS — Z3A35 35 weeks gestation of pregnancy: Secondary | ICD-10-CM | POA: Diagnosis present

## 2014-08-25 DIAGNOSIS — O98513 Other viral diseases complicating pregnancy, third trimester: Secondary | ICD-10-CM | POA: Diagnosis present

## 2014-08-25 DIAGNOSIS — Z825 Family history of asthma and other chronic lower respiratory diseases: Secondary | ICD-10-CM

## 2014-08-25 DIAGNOSIS — O99213 Obesity complicating pregnancy, third trimester: Secondary | ICD-10-CM | POA: Diagnosis present

## 2014-08-25 DIAGNOSIS — Z9114 Patient's other noncompliance with medication regimen: Secondary | ICD-10-CM | POA: Diagnosis present

## 2014-08-25 DIAGNOSIS — O10913 Unspecified pre-existing hypertension complicating pregnancy, third trimester: Secondary | ICD-10-CM | POA: Diagnosis present

## 2014-08-25 DIAGNOSIS — O09899 Supervision of other high risk pregnancies, unspecified trimester: Secondary | ICD-10-CM

## 2014-08-25 DIAGNOSIS — O24113 Pre-existing diabetes mellitus, type 2, in pregnancy, third trimester: Secondary | ICD-10-CM | POA: Diagnosis present

## 2014-08-25 DIAGNOSIS — O139 Gestational [pregnancy-induced] hypertension without significant proteinuria, unspecified trimester: Secondary | ICD-10-CM | POA: Diagnosis present

## 2014-08-25 DIAGNOSIS — Z818 Family history of other mental and behavioral disorders: Secondary | ICD-10-CM

## 2014-08-25 DIAGNOSIS — E119 Type 2 diabetes mellitus without complications: Secondary | ICD-10-CM | POA: Diagnosis present

## 2014-08-25 DIAGNOSIS — O9989 Other specified diseases and conditions complicating pregnancy, childbirth and the puerperium: Secondary | ICD-10-CM

## 2014-08-25 DIAGNOSIS — O113 Pre-existing hypertension with pre-eclampsia, third trimester: Principal | ICD-10-CM | POA: Diagnosis present

## 2014-08-25 DIAGNOSIS — B951 Streptococcus, group B, as the cause of diseases classified elsewhere: Secondary | ICD-10-CM | POA: Diagnosis present

## 2014-08-25 DIAGNOSIS — Z2839 Other underimmunization status: Secondary | ICD-10-CM

## 2014-08-25 DIAGNOSIS — O99891 Other specified diseases and conditions complicating pregnancy: Secondary | ICD-10-CM

## 2014-08-25 DIAGNOSIS — O119 Pre-existing hypertension with pre-eclampsia, unspecified trimester: Secondary | ICD-10-CM | POA: Diagnosis present

## 2014-08-25 DIAGNOSIS — B019 Varicella without complication: Secondary | ICD-10-CM | POA: Diagnosis present

## 2014-08-25 DIAGNOSIS — Z6841 Body Mass Index (BMI) 40.0 and over, adult: Secondary | ICD-10-CM

## 2014-08-25 DIAGNOSIS — Z283 Underimmunization status: Secondary | ICD-10-CM

## 2014-08-25 HISTORY — DX: Essential (primary) hypertension: I10

## 2014-08-25 LAB — COMPREHENSIVE METABOLIC PANEL
ALT: 17 U/L (ref 0–35)
AST: 17 U/L (ref 0–37)
Albumin: 2.3 g/dL — ABNORMAL LOW (ref 3.5–5.2)
Alkaline Phosphatase: 100 U/L (ref 39–117)
Anion gap: 6 (ref 5–15)
BUN: 9 mg/dL (ref 6–23)
CO2: 23 mmol/L (ref 19–32)
Calcium: 8.8 mg/dL (ref 8.4–10.5)
Chloride: 105 mEq/L (ref 96–112)
Creatinine, Ser: 0.54 mg/dL (ref 0.50–1.10)
GFR calc Af Amer: >90 mL/min (ref >90)
GFR calc non Af Amer: >90 mL/min (ref >90)
Glucose, Bld: 128 mg/dL — ABNORMAL HIGH (ref 70–99)
Potassium: 4 mmol/L (ref 3.5–5.1)
Sodium: 134 mmol/L — ABNORMAL LOW (ref 135–145)
Total Bilirubin: 0.3 mg/dL (ref 0.3–1.2)
Total Protein: 6 g/dL (ref 6.0–8.3)

## 2014-08-25 LAB — URINALYSIS, ROUTINE W REFLEX MICROSCOPIC
Bilirubin Urine: NEGATIVE
Glucose, UA: NEGATIVE mg/dL
Hgb urine dipstick: NEGATIVE
Ketones, ur: NEGATIVE mg/dL
Nitrite: NEGATIVE
Protein, ur: 100 mg/dL — AB
Specific Gravity, Urine: 1.025 (ref 1.005–1.030)
Urobilinogen, UA: 1 mg/dL (ref 0.0–1.0)
pH: 6.5 (ref 5.0–8.0)

## 2014-08-25 LAB — URINE MICROSCOPIC-ADD ON

## 2014-08-25 LAB — CBC
HCT: 30.8 % — ABNORMAL LOW (ref 36.0–46.0)
Hemoglobin: 10.7 g/dL — ABNORMAL LOW (ref 12.0–15.0)
MCH: 29.3 pg (ref 26.0–34.0)
MCHC: 34.7 g/dL (ref 30.0–36.0)
MCV: 84.4 fL (ref 78.0–100.0)
Platelets: 320 10*3/uL (ref 150–400)
RBC: 3.65 MIL/uL — ABNORMAL LOW (ref 3.87–5.11)
RDW: 13.5 % (ref 11.5–15.5)
WBC: 9.6 10*3/uL (ref 4.0–10.5)

## 2014-08-25 LAB — GLUCOSE, CAPILLARY: Glucose-Capillary: 119 mg/dL — ABNORMAL HIGH (ref 70–99)

## 2014-08-25 LAB — OB RESULTS CONSOLE GBS: GBS: POSITIVE

## 2014-08-25 LAB — TYPE AND SCREEN
ABO/RH(D): B POS
Antibody Screen: NEGATIVE

## 2014-08-25 LAB — ABO/RH: ABO/RH(D): B POS

## 2014-08-25 MED ORDER — GLYBURIDE 2.5 MG PO TABS
2.5000 mg | ORAL_TABLET | Freq: Two times a day (BID) | ORAL | Status: DC
  Administered 2014-08-25 – 2014-08-27 (×4): 2.5 mg via ORAL
  Filled 2014-08-25 (×6): qty 1

## 2014-08-25 MED ORDER — CALCIUM CARBONATE ANTACID 500 MG PO CHEW
2.0000 | CHEWABLE_TABLET | ORAL | Status: DC | PRN
  Filled 2014-08-25: qty 2

## 2014-08-25 MED ORDER — ACETAMINOPHEN 325 MG PO TABS
650.0000 mg | ORAL_TABLET | ORAL | Status: DC | PRN
  Filled 2014-08-25: qty 2

## 2014-08-25 MED ORDER — LABETALOL HCL 5 MG/ML IV SOLN
10.0000 mg | INTRAVENOUS | Status: DC | PRN
  Administered 2014-08-25: 40 mg via INTRAVENOUS
  Administered 2014-08-25: 10 mg via INTRAVENOUS
  Administered 2014-08-25 (×2): 20 mg via INTRAVENOUS
  Administered 2014-08-25 (×3): 10 mg via INTRAVENOUS
  Filled 2014-08-25 (×8): qty 4

## 2014-08-25 MED ORDER — LABETALOL HCL 300 MG PO TABS
400.0000 mg | ORAL_TABLET | Freq: Two times a day (BID) | ORAL | Status: DC
  Administered 2014-08-25 – 2014-08-26 (×3): 400 mg via ORAL
  Filled 2014-08-25 (×6): qty 1

## 2014-08-25 MED ORDER — ZOLPIDEM TARTRATE 5 MG PO TABS: 5.0000 mg | ORAL_TABLET | Freq: Every evening | ORAL | Status: DC | PRN

## 2014-08-25 MED ORDER — PRENATAL MULTIVITAMIN CH
1.0000 | ORAL_TABLET | Freq: Every day | ORAL | Status: DC
  Administered 2014-08-25 – 2014-08-26 (×2): 1 via ORAL
  Filled 2014-08-25 (×4): qty 1

## 2014-08-25 MED ORDER — LABETALOL HCL 5 MG/ML IV SOLN
10.0000 mg | INTRAVENOUS | Status: DC | PRN
  Administered 2014-08-26: 20 mg via INTRAVENOUS
  Administered 2014-08-26: 10 mg via INTRAVENOUS
  Administered 2014-08-26: 40 mg via INTRAVENOUS
  Administered 2014-08-26: 20 mg via INTRAVENOUS
  Administered 2014-08-26 (×3): 10 mg via INTRAVENOUS
  Filled 2014-08-25 (×12): qty 4

## 2014-08-25 MED ORDER — DOCUSATE SODIUM 100 MG PO CAPS
100.0000 mg | ORAL_CAPSULE | Freq: Every day | ORAL | Status: DC
  Filled 2014-08-25 (×4): qty 1

## 2014-08-25 MED ORDER — VALACYCLOVIR HCL 500 MG PO TABS: 500.0000 mg | ORAL_TABLET | Freq: Every day | ORAL | Status: DC

## 2014-08-25 MED ORDER — ACETAMINOPHEN 500 MG PO TABS: 500.0000 mg | ORAL_TABLET | Freq: Four times a day (QID) | ORAL | Status: DC | PRN

## 2014-08-25 MED ORDER — VALACYCLOVIR HCL 500 MG PO TABS
500.0000 mg | ORAL_TABLET | Freq: Every day | ORAL | Status: DC
  Administered 2014-08-25 – 2014-08-26 (×2): 500 mg via ORAL
  Filled 2014-08-25 (×2): qty 1

## 2014-08-25 NOTE — H&P (Signed)
Lauren Delgado is a 28 y.o. female, G1P0 at 38 weeks, presenting for admission due to chronic hypertension with elevated PCR.  Chronic hypertension has been poorly controlled due to patient not taking Labetalol per ordered regimen--initially on 200 mg po BID, increased to 400 mg BID at 33 5/7 weeks, but patient has not followed that regimen.  Seen in office yesterday, with BP 160/90 and PIH labs/PCR done.    [place] 08/24/14:  EFW 5+7, 2462 gm, 38%ile, AFI 15.1, 38%ile, vtx, BPP 8/8.  Rechecked in office today--BP 154/90, with PCR 0.64 and normal PIH labs. Sent to hospital for MFM consult.  Required 3 doses IV Labetalol for BP parameters.  Denies HA,visual sx, epigastric pain, or edema.  Denies any HSV lesions or prodrome.  Consult note from Dr. Burnett Harry: Assessment: 1) SIUP at 35+0 weeks 2) Chronic hypertension; exacerbation of hypertension verses superimposed preeclampsia 3) Type 2 DM on glyburide - unknown control 4) Morbid obesity  Recommend: 1) Start po Labetalol 400 gms bid; continue IV boluses as needed 2) Consider magnesium sulfate if BPs remain > 160/110 after restarting po Labetalol 3) Obtain 24 hour urine for total protein 4) Continue standard management for type 2 DM 5) Observe overnight - may need to be delivered during this admission or could be followed closely as outpt and delivered by 37 weeks (would like to review office records i.e. most recent labs, ultrasounds, blood sugars etc) 6) Low threshold for delivery; any severe features  Patient Active Problem List   Diagnosis Date Noted  . Positive GBS test 08/25/2014  . Chronic hypertension with exacerbation during pregnancy in third trimester 08/25/2014  . Rubella non-immune status, antepartum 08/25/2014  . Varicella non-immune 08/25/2014  . Type 2 diabetes mellitus affecting pregnancy in second trimester, antepartum 06/07/2014  . HYPOTHYROIDISM, BORDERLINE 12/19/2006  . Polycystic Ovaries 10/31/2006  . OBESITY--BMI 54.5  10/31/2006  . Essential Hypertension, Benign 10/31/2006    History of present pregnancy: Patient entered care at 11 1/7 weeks.   EDC of 09/29/14 was established by LMP.   1st trimester scan at 13 1/7 weeks = EDC 10/04/04 (within 7 days of LMP dating) Anatomy scan: 19 weeks, with EDC of 10/11/13 (dating still maintained from 1st trimester scan) and an anterior placenta.  Limited anatomy views, normal cervix and AFV. Additional [place] evaluations:   21 5/7 weeks for anatomy f/u:    Still limited views of heart and philtrum.  Small head circumference noted, 4.4%, closed cervix.  EFW 388 gm, 13.2%ile. 24 6/7 weeks:  EFW 1+8, 689 gm, 21.8%ile, normal fluid, completion of anatomy.  HC < 2.3%ile. 28 1/7 weeks:  EFW 2+8, 1120 gm, 24.3%ile, HC 12.2%ile, AFI 15.18, 60%ile, vtx, normal cervix 32 weeks:  EFW 3+13, 1737 gm, 20.3%ile, HC 7.1%ile, AFI 18.40, 70%ile, vtx. 34 6/7 weeks:  EFW 5+7, 2462 gm, 38%ile, AFI 15,1, 55%ile, vtx, anterior placenta.  Significant prenatal events:  BP 140/80 at NOB, with 2+ glycosuria.  Sent to [facility] for diabetic management at 13 weeks.  BP 150/90. Acute gastritis at 15 weeks.  Started glyburide 2.5 mg HS.  On labetalol 100 mg po daily at 16 weeks, with BP 156/70.  Referred to Dr. Chalmers Cater for diabetes management. Seen at [facility]U 9/15 for abdominal pain--US showed gall bladder sludge.  On labetalol 200 mg BID at that time.  Had normal fetal echo.  Growth noted at 13%ile and plan made for f/u.  Opthalmologist seen around 26 weeks, with elevated pressure but no dx of retinopathy.  BPPs were begun at 32 weeks. Labetalol increased to 400 mg BID at 33 5/7 weeks, but patient did not follow regimen.  BP 160/90 12/22, with PIH labs and PCR ordered.  Last evaluation: 08/25/14--BP 150/90, weight 303, trace edema.  Denied any PIH sx.  PCR resulted at 0.64 from previous day, with normal PIH labs  OB History    Gravida Para Term Preterm AB TAB SAB Ectopic Multiple Living   1         0     Past Medical  History  Diagnosis Date  . Gestational diabetes mellitus, antepartum   . Hypertension   Gall bladder sludge dx 05/22/14  Past Surgical History  Procedure Laterality Date  . No past surgeries     Family History: family history includes Arthritis in her father, maternal grandfather, maternal grandmother, mother, paternal grandfather, and paternal grandmother; Asthma in her brother; COPD in her maternal aunt and maternal grandfather; Cancer in her maternal aunt; Depression in her brother and mother; Diabetes in her maternal grandfather, maternal grandmother, paternal grandfather, and paternal grandmother; Hearing loss in her maternal grandfather; Heart disease in her maternal uncle; Hypertension in her brother, father, maternal aunt, maternal grandfather, maternal grandmother, maternal uncle, mother, paternal aunt, paternal grandfather, paternal grandmother, and paternal uncle; Learning disabilities in her brother, maternal uncle, and paternal uncle; Mental retardation in her maternal uncle and paternal uncle; Miscarriages / Stillbirths in her maternal aunt and mother; Stroke in her maternal grandmother; Varicose Veins in her maternal grandmother; Vision loss in her father.    Social History:  reports that she has never smoked. She has never used smokeless tobacco. She reports that she does not drink alcohol or use illicit drugs.  Patient is African-American, of the [employer], single, employed as Scientist, water quality.  Patient's mother is present with her and supportive.   Prenatal Transfer Tool  Maternal Diabetes: Yes:  Diabetes Type:  Pre-pregnancy Genetic Screening: Normal Maternal Ultrasounds/Referrals: Normal Fetal Ultrasounds or other Referrals:  Fetal echo WNL--done due to maternal diabetes Maternal Substance Abuse:  No Significant Maternal Medications:  Meds include: Other: Glyburide, Labetalol, Valtrex Significant Maternal Lab Results: Lab values include: Group B Strep positive  Declined both  TDAP and flu vaccine   ROS:  +FM  No Known Allergies     Blood pressure 152/85, pulse 97, temperature 97.9 F (36.6 C), temperature source Oral, resp. rate 20, height 5\' 3"  (1.6 m), weight 303 lb 6.4 oz (137.621 kg), SpO2 98 %.   Filed Vitals:   08/25/14 1952 08/25/14 2006 08/25/14 2007 08/25/14 2008  BP: 155/101 152/85  152/85  Pulse: 95 76 98 97  Temp:    97.9 F (36.6 C)  TempSrc:    Oral  Resp:    20  Height:      Weight:      SpO2:   98%      Chest clear Heart RRR without murmur Abd gravid, NT, FH 47 cm Pelvic: Deferred--denies any recent/current HSV lesions Ext: DTR 1+, no clonus, trace edema  FHR: Category 1 UCs:  None  Prenatal labs: ABO, Rh: --/--/B POS (12/23 1709) Antibody: NEG (12/23 1709) Rubella:   Non-immune RPR:   NR HBsAg:   Neg HIV:   NR GBS:  Positive 03/04/14 in NOB urine Sickle cell/Hgb electrophoresis:  AA Pap:  03/2014 WNL GC:  Negative 03/04/14 Chlamydia:  Negative 03/04/14 Genetic screenings:  Normal 1st trimester screen and AFP Glucola:  NA:  HGB A1C 8.9 on 05/05/14. 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 Cletis Media NST q shift [place] labs daily--ordered for next 5 days. CBGs FBS and 2 hour pp Labetalol 400 mg po BID--1st dose now Labetalol IV prn for parameters:  Systolic > or = 491, diastolic > or = 791. Glyburide 2.5 mg po BID Continue PNV, Valtrex daily. Reviewed current plan of care for admission with patient and mother, including plan for 24 hour urine, monitoring  of labs, and re-evaluation prn. They are agreeable with plan.    Allena Katz, [place] 08/25/2014, 8:59 PM

## 2014-08-25 NOTE — MAU Note (Signed)
Patient states she was seen in the office yesterday and had elevated BP and protein in the urine. Was called and told to come in today for assessment. On Labetalol 400mg  bid. Patient denies S/S, headache swelling or pain. Denies contractions, bleeding or leaking and reports good fetal movement.

## 2014-08-25 NOTE — Progress Notes (Signed)
Labetalol 40 mg Iv given for BP

## 2014-08-25 NOTE — MAU Provider Note (Signed)
Lauren Delgado is a 28 y.o. G1P0 at 35.0 weeks presents to MAU from the office with abn PIH labs and HTN.  Pt reports she was taking 200mg  of labetalol BID.  Dr Raphael Gibney increased it to 400mg  BID 2 weeks ago but she never filled the prescription until today. Per Dr Alesia Richards the pt is not in agreement with being admitted and week need a MFM consult for managing her as an out patient.  The pt confirms that she will not be an inpatient.    History    Patient Active Problem List   Diagnosis Date Noted  . Type 2 diabetes mellitus affecting pregnancy in second trimester, antepartum 06/07/2014  . HYPOTHYROIDISM, BORDERLINE 12/19/2006  . DM, UNCOMPLICATED, TYPE II 23/53/6144  . ABNORMAL PAP SMEAR, LGSIL 12/19/2006  . Polycystic Ovaries 10/31/2006  . OBESITY, NOS 10/31/2006  . Essential Hypertension, Benign 10/31/2006  . RHINITIS, ALLERGIC 10/31/2006  . AMENORRHEA 10/31/2006    Chief Complaint  Patient presents with  . Hypertension   HPI  OB History    Gravida Para Term Preterm AB TAB SAB Ectopic Multiple Living   1               Past Medical History  Diagnosis Date  . Gestational diabetes mellitus, antepartum     Past Surgical History  Procedure Laterality Date  . No past surgeries      No family history on file.  History  Substance Use Topics  . Smoking status: Never Smoker   . Smokeless tobacco: Not on file  . Alcohol Use: No    Allergies: No Known Allergies  Prescriptions prior to admission  Medication Sig Dispense Refill Last Dose  . acetaminophen (TYLENOL) 500 MG tablet Take 500 mg by mouth every 6 (six) hours as needed for mild pain.   Not Taking  . glyBURIDE (DIABETA) 2.5 MG tablet Take 2.5 mg by mouth 2 (two) times daily.    Taking  . hydrocortisone cream 1 % Apply 1 application topically daily as needed (rash).   Taking  . labetalol (NORMODYNE) 200 MG tablet Take 200 mg by mouth 2 (two) times daily.   Taking  . omeprazole (PRILOSEC) 20 MG capsule Take 20 mg  by mouth 2 (two) times daily as needed (indigestion).   Taking  . Prenatal Multivit-Min-Fe-FA (PRENATAL VITAMINS PO) Take 1 each by mouth daily.   Taking  . Pseudoephedrine HCl (SUDAFED PO) Take 2 tablets by mouth every 6 (six) hours as needed (congestion).   Not Taking    ROS See HPI above, all other systems are negative  Physical Exam   Blood pressure 193/102, pulse 86, temperature 98.1 F (36.7 C), temperature source Oral, resp. rate 18, height 5\' 3"  (1.6 m), weight 303 lb 6.4 oz (137.621 kg), SpO2 100 %.  Physical Exam Ext:  WNL ABD: Soft, non tender to palpation, no rebound or guarding SVE: sdferred   ED Course  Assessment: IUP at  35.0 weeks Membranes: intact FHR: 145 CTX: none   Plan: MFM consult IV labetalol PRN   Lauren Delgado, CNM, MSN 08/25/2014. 1:20 PM

## 2014-08-25 NOTE — Consult Note (Signed)
MFM Note  Lauren Delgado is a 28 year old G43 AA female at 35+0 weeks who was admitted from the office today for elevated BPs. Pt was diagnosed with hypertension at the beginning of this pregnancy and started on Labetalol 200 bid. After review of available prenatal records, her BPs ranged from 140s-150s/80s-100. She was also started on glyburide this pregnancy and was seen by Dr. Cruzita Lederer. She has been taking 2.5 mgs before breakfast and dinner (elevated A1c in 2008). Lauren Delgado reports being seen in office yesterday for visit and [place]. BPs was elevated and labs performed (?). EFW was 5+7. She was called and asked to return today for a F/U visit and then admitted.  Pt reports excellent FM; denies headache, change in vision, abdominal pain, shortness of breath and vaginal bleeding.  Labs so far are all WNL  Initial BP was 193/102; she received 2 doses of Labetalol and most recent BP was 158/84. Tracing reactive  Assessment: 1) SIUP at 35+0 weeks 2) Chronic hypertension; exacerbation of hypertension verses superimposed preeclampsia 3) Type 2 DM on glyburide - unknown control 4) Morbid obesity  Recommend: 1) Start po Labetalol 400 gms bid; continue IV boluses as needed 2) Consider magnesium sulfate if BPs remain > 160/110 after restarting po Labetalol 3) Obtain 24 hour urine for total protein 4) Continue standard management for type 2 DM 5) Observe overnight - may need to be delivered during this admission or could be followed closely as outpt and delivered by 37 weeks (would like to review office records i.e. most recent labs, ultrasounds, blood sugars etc) 6) Low threshold for delivery; any severe features  Discussed case with Darlis Loan (thanks! for the call and info).  (Face-to-face consultation with patient: 30 min)

## 2014-08-25 NOTE — MAU Note (Signed)
Delay in starting IV and med due to patient being very tearful and refusing IV.  Pt hasn't ever received an IV before and much discussion reference High BP which could cause stroke, seizure activity and even death before pt was willing to agree.  Then pt states she needed to urinate prior to IV and med given.  Urine clean catch was obtained, pt placed on EFM again and then three IV attempts by two RN's and CNM before successful IV.  Pt was then given IV med for BP.  Pt very tearful and wanting to go home.  Much counseling and support given to pt to stay.

## 2014-08-26 DIAGNOSIS — Z818 Family history of other mental and behavioral disorders: Secondary | ICD-10-CM | POA: Diagnosis not present

## 2014-08-26 DIAGNOSIS — O99213 Obesity complicating pregnancy, third trimester: Secondary | ICD-10-CM | POA: Diagnosis present

## 2014-08-26 DIAGNOSIS — O99820 Streptococcus B carrier state complicating pregnancy: Secondary | ICD-10-CM | POA: Diagnosis present

## 2014-08-26 DIAGNOSIS — Z6841 Body Mass Index (BMI) 40.0 and over, adult: Secondary | ICD-10-CM | POA: Diagnosis not present

## 2014-08-26 DIAGNOSIS — Z825 Family history of asthma and other chronic lower respiratory diseases: Secondary | ICD-10-CM | POA: Diagnosis not present

## 2014-08-26 DIAGNOSIS — E119 Type 2 diabetes mellitus without complications: Secondary | ICD-10-CM | POA: Diagnosis present

## 2014-08-26 DIAGNOSIS — Z3A35 35 weeks gestation of pregnancy: Secondary | ICD-10-CM | POA: Diagnosis present

## 2014-08-26 DIAGNOSIS — O139 Gestational [pregnancy-induced] hypertension without significant proteinuria, unspecified trimester: Secondary | ICD-10-CM | POA: Diagnosis present

## 2014-08-26 DIAGNOSIS — Z8249 Family history of ischemic heart disease and other diseases of the circulatory system: Secondary | ICD-10-CM | POA: Diagnosis not present

## 2014-08-26 DIAGNOSIS — Z9114 Patient's other noncompliance with medication regimen: Secondary | ICD-10-CM | POA: Diagnosis present

## 2014-08-26 DIAGNOSIS — O98513 Other viral diseases complicating pregnancy, third trimester: Secondary | ICD-10-CM | POA: Diagnosis present

## 2014-08-26 DIAGNOSIS — O113 Pre-existing hypertension with pre-eclampsia, third trimester: Secondary | ICD-10-CM | POA: Diagnosis not present

## 2014-08-26 DIAGNOSIS — O24113 Pre-existing diabetes mellitus, type 2, in pregnancy, third trimester: Secondary | ICD-10-CM | POA: Diagnosis present

## 2014-08-26 DIAGNOSIS — O1092 Unspecified pre-existing hypertension complicating childbirth: Secondary | ICD-10-CM | POA: Diagnosis present

## 2014-08-26 DIAGNOSIS — Z8261 Family history of arthritis: Secondary | ICD-10-CM | POA: Diagnosis not present

## 2014-08-26 LAB — CBC
HCT: 30.4 % — ABNORMAL LOW (ref 36.0–46.0)
Hemoglobin: 10.3 g/dL — ABNORMAL LOW (ref 12.0–15.0)
MCH: 28.8 pg (ref 26.0–34.0)
MCHC: 33.9 g/dL (ref 30.0–36.0)
MCV: 84.9 fL (ref 78.0–100.0)
Platelets: 283 10*3/uL (ref 150–400)
RBC: 3.58 MIL/uL — ABNORMAL LOW (ref 3.87–5.11)
RDW: 13.6 % (ref 11.5–15.5)
WBC: 8 10*3/uL (ref 4.0–10.5)

## 2014-08-26 LAB — COMPREHENSIVE METABOLIC PANEL
ALT: 16 U/L (ref 0–35)
AST: 18 U/L (ref 0–37)
Albumin: 2.3 g/dL — ABNORMAL LOW (ref 3.5–5.2)
Alkaline Phosphatase: 86 U/L (ref 39–117)
Anion gap: 7 (ref 5–15)
BUN: 9 mg/dL (ref 6–23)
CO2: 22 mmol/L (ref 19–32)
Calcium: 8.8 mg/dL (ref 8.4–10.5)
Chloride: 109 mEq/L (ref 96–112)
Creatinine, Ser: 0.52 mg/dL (ref 0.50–1.10)
GFR calc Af Amer: >90 mL/min (ref >90)
GFR calc non Af Amer: >90 mL/min (ref >90)
Glucose, Bld: 74 mg/dL (ref 70–99)
Potassium: 3.6 mmol/L (ref 3.5–5.1)
Sodium: 138 mmol/L (ref 135–145)
Total Bilirubin: 0.3 mg/dL (ref 0.3–1.2)
Total Protein: 6.4 g/dL (ref 6.0–8.3)

## 2014-08-26 LAB — GLUCOSE, CAPILLARY
Glucose-Capillary: 79 mg/dL (ref 70–99)
Glucose-Capillary: 114 mg/dL — ABNORMAL HIGH (ref 70–99)
Glucose-Capillary: 96 mg/dL (ref 70–99)
Glucose-Capillary: 150 mg/dL — ABNORMAL HIGH (ref 70–99)

## 2014-08-26 LAB — TSH: TSH: 4.57 u[IU]/mL — ABNORMAL HIGH (ref 0.350–4.500)

## 2014-08-26 LAB — LACTATE DEHYDROGENASE: LDH: 128 U/L (ref 94–250)

## 2014-08-26 LAB — URIC ACID: Uric Acid, Serum: 4.3 mg/dL (ref 2.4–7.0)

## 2014-08-26 MED ORDER — SODIUM CHLORIDE 0.9 % IJ SOLN
3.0000 mL | Freq: Two times a day (BID) | INTRAMUSCULAR | Status: DC
  Administered 2014-08-26 – 2014-08-27 (×4): 3 mL via INTRAVENOUS

## 2014-08-26 MED ORDER — HYDRALAZINE HCL 20 MG/ML IJ SOLN
5.0000 mg | Freq: Once | INTRAMUSCULAR | Status: AC
  Administered 2014-08-26: 5 mg via INTRAVENOUS
  Filled 2014-08-26: qty 1

## 2014-08-26 MED ORDER — LABETALOL HCL 300 MG PO TABS
400.0000 mg | ORAL_TABLET | Freq: Three times a day (TID) | ORAL | Status: DC
  Administered 2014-08-27 (×2): 400 mg via ORAL
  Filled 2014-08-26 (×4): qty 1

## 2014-08-26 NOTE — Progress Notes (Signed)
Hospital day # 1 pregnancy at [redacted]w[redacted]d--questionable gestational hypertension exacerbation vs pre eclampsia  S:  Doing well--had mild HA on awakening, now resolved s/p Tylenol at 0528       Denies visual changes, epigastric pain, SOB, or chest pain.        Perception of contractions: none      Vaginal bleeding: none now       Vaginal discharge:  no significant change   O: BP 142/59 mmHg  Pulse 93  Temp(Src) 98.1 F (36.7 C) (Oral)  Resp 24  Ht 5\' 3"  (1.6 m)  Wt 303 lb 6.4 oz (137.621 kg)  BMI 53.76 kg/m2  SpO2 98%             FHR: Catagory  1      CTXs: none      Uterus gravid and non-tender       DTR: no      Clonus: 2+      Extremities: no significant edema and no signs of DVT      Abd: soft, non-tender, +BS, no rebound, no guarding       Respiratory: CTAB. Effort normal.               Results for orders placed or performed during the hospital encounter of 08/25/14 (from the past 24 hour(s))  Urinalysis, Routine w reflex microscopic     Status: Abnormal   Collection Time: 08/25/14  2:05 PM  Result Value Ref Range   Color, Urine YELLOW YELLOW   APPearance CLEAR CLEAR   Specific Gravity, Urine 1.025 1.005 - 1.030   pH 6.5 5.0 - 8.0   Glucose, UA NEGATIVE NEGATIVE mg/dL   Hgb urine dipstick NEGATIVE NEGATIVE   Bilirubin Urine NEGATIVE NEGATIVE   Ketones, ur NEGATIVE NEGATIVE mg/dL   Protein, ur 100 (A) NEGATIVE mg/dL   Urobilinogen, UA 1.0 0.0 - 1.0 mg/dL   Nitrite NEGATIVE NEGATIVE   Leukocytes, UA TRACE (A) NEGATIVE  Urine microscopic-add on     Status: Abnormal   Collection Time: 08/25/14  2:05 PM  Result Value Ref Range   Squamous Epithelial / LPF FEW (A) RARE   WBC, UA 11-20 <3 WBC/hpf   RBC / HPF 3-6 <3 RBC/hpf   Bacteria, UA MANY (A) RARE   Urine-Other MUCOUS PRESENT   CBC     Status: Abnormal   Collection Time: 08/25/14  5:09 PM  Result Value Ref Range   WBC 9.6 4.0 - 10.5 K/uL   RBC 3.65 (L) 3.87 - 5.11 MIL/uL   Hemoglobin 10.7 (L) 12.0 - 15.0 g/dL    HCT 30.8 (L) 36.0 - 46.0 %   MCV 84.4 78.0 - 100.0 fL   MCH 29.3 26.0 - 34.0 pg   MCHC 34.7 30.0 - 36.0 g/dL   RDW 13.5 11.5 - 15.5 %   Platelets 320 150 - 400 K/uL  Type and screen     Status: None   Collection Time: 08/25/14  5:09 PM  Result Value Ref Range   ABO/RH(D) B POS    Antibody Screen NEG    Sample Expiration 08/28/2014   Comprehensive metabolic panel     Status: Abnormal   Collection Time: 08/25/14  5:09 PM  Result Value Ref Range   Sodium 134 (L) 135 - 145 mmol/L   Potassium 4.0 3.5 - 5.1 mmol/L   Chloride 105 96 - 112 mEq/L   CO2 23 19 - 32 mmol/L   Glucose, Bld 128 (H) 70 -  99 mg/dL   BUN 9 6 - 23 mg/dL   Creatinine, Ser 0.54 0.50 - 1.10 mg/dL   Calcium 8.8 8.4 - 10.5 mg/dL   Total Protein 6.0 6.0 - 8.3 g/dL   Albumin 2.3 (L) 3.5 - 5.2 g/dL   AST 17 0 - 37 U/L   ALT 17 0 - 35 U/L   Alkaline Phosphatase 100 39 - 117 U/L   Total Bilirubin 0.3 0.3 - 1.2 mg/dL   GFR calc non Af Amer >90 >90 mL/min   GFR calc Af Amer >90 >90 mL/min   Anion gap 6 5 - 15  Glucose, capillary     Status: None   Collection Time: 08/26/14  4:48 AM  Result Value Ref Range   Glucose-Capillary 79 70 - 99 mg/dL  CBC     Status: Abnormal   Collection Time: 08/26/14  7:00 AM  Result Value Ref Range   WBC 8.0 4.0 - 10.5 K/uL   RBC 3.58 (L) 3.87 - 5.11 MIL/uL   Hemoglobin 10.3 (L) 12.0 - 15.0 g/dL   HCT 30.4 (L) 36.0 - 46.0 %   MCV 84.9 78.0 - 100.0 fL   MCH 28.8 26.0 - 34.0 pg   MCHC 33.9 30.0 - 36.0 g/dL   RDW 13.6 11.5 - 15.5 %   Platelets 283 150 - 400 K/uL  Comprehensive metabolic panel     Status: Abnormal   Collection Time: 08/26/14  7:00 AM  Result Value Ref Range   Sodium 138 135 - 145 mmol/L   Potassium 3.6 3.5 - 5.1 mmol/L   Chloride 109 96 - 112 mEq/L   CO2 22 19 - 32 mmol/L   Glucose, Bld 74 70 - 99 mg/dL   BUN 9 6 - 23 mg/dL   Creatinine, Ser 0.52 0.50 - 1.10 mg/dL   Calcium 8.8 8.4 - 10.5 mg/dL   Total Protein 6.4 6.0 - 8.3 g/dL   Albumin 2.3 (L) 3.5 - 5.2 g/dL    AST 18 0 - 37 U/L   ALT 16 0 - 35 U/L   Alkaline Phosphatase 86 39 - 117 U/L   Total Bilirubin 0.3 0.3 - 1.2 mg/dL   GFR calc non Af Amer >90 >90 mL/min   GFR calc Af Amer >90 >90 mL/min   Anion gap 7 5 - 15  Lactate dehydrogenase     Status: None   Collection Time: 08/26/14  7:00 AM  Result Value Ref Range   LDH 128 94 - 250 U/L  Uric acid     Status: None   Collection Time: 08/26/14  7:00 AM  Result Value Ref Range   Uric Acid, Serum 4.3 2.4 - 7.0 mg/dL  Glucose, capillary     Status: Abnormal   Collection Time: 08/26/14 10:03 AM  Result Value Ref Range   Glucose-Capillary 114 (H) 70 - 99 mg/dL        Meds: Current facility-administered medications: acetaminophen (TYLENOL) tablet 500 mg, 500 mg, Oral, Q6H PRN, Arelly Whittenberg, CNM;  acetaminophen (TYLENOL) tablet 650 mg, 650 mg, Oral, Q4H PRN, Brantley Wiley, CNM;  calcium carbonate (TUMS - dosed in mg elemental calcium) chewable tablet 400 mg of elemental calcium, 2 tablet, Oral, Q4H PRN, Lihanna Biever, CNM docusate sodium (COLACE) capsule 100 mg, 100 mg, Oral, Daily, Miliani Deike, CNM, 100 mg at 08/25/14 1715;  glyBURIDE (DIABETA) tablet 2.5 mg, 2.5 mg, Oral, BID, Kiani Wurtzel, CNM, 2.5 mg at 08/26/14 1000;  labetalol (NORMODYNE) tablet 400 mg, 400 mg,  Oral, BID, Torien Ramroop, CNM, 400 mg at 08/26/14 8757;  labetalol (NORMODYNE,TRANDATE) injection 10 mg, 10 mg, Intravenous, Q10 min PRN, Donnel Saxon, CNM, 40 mg at 08/26/14 9728 prenatal multivitamin tablet 1 tablet, 1 tablet, Oral, Q1200, Lebron Nauert, CNM, 1 tablet at 08/25/14 2244;  valACYclovir (VALTREX) tablet 500 mg, 500 mg, Oral, Daily, Donnel Saxon, CNM, 500 mg at 08/25/14 2244;  zolpidem (AMBIEN) tablet 5 mg, 5 mg, Oral, QHS PRN, Diedra Sinor, CNM      A: [redacted]w[redacted]d with questionable gestational hypertension exacerbation vs pre eclampsia     unchanged     Meds:  Labetalol 400mg  BID and labetalol IV PRN     GBS: positive     DM II     RNI     Varicella NI   P:  Continue current plan of care per       Upcoming tests/treatments:  24hr urine at [place] reviewed with patient and her mother      HTN parameters       MDs will follow     Sherlin Sonier, CNM, MSN 08/26/2014. 10:45 AM

## 2014-08-26 NOTE — Progress Notes (Signed)
  Subjective: Sleeping at intervals--denies HA, visual sx, or epigastric pain.  Objective: BP 169/79 mmHg  Pulse 81  Temp(Src) 98.1 F (36.7 C) (Oral)  Resp 20  Ht 5\' 3"  (1.6 m)  Wt 303 lb 6.4 oz (137.621 kg)  BMI 53.76 kg/m2  SpO2 98% I/O last 3 completed shifts: In: -  Out: 100 [Urine:100] Total I/O In: -  Out: 900 [Urine:900]   CBG (last 3)   Recent Labs  08/25/14 2241  GLUCAP 119*    Filed Vitals:   08/26/14 0432 08/26/14 0450 08/26/14 0501 08/26/14 0513  BP: 182/90 176/89 160/95 169/79  Pulse: 85 87 94 81  Temp:      TempSrc:      Resp:      Height:      Weight:      SpO2:       BP range since 0351:  160-188/80-95 Received IV Labetalol x 3 doses: 0406--10 mg 0420--20 mg 0438--40 mg   FHT: Category 1 UC:   none  Assessment:  Gestational hypertension--exacerbation vs pre-eclampsia GBS positive Type 2 DM  Plan: Consulted with Dr. Cletis Media Apresoline 5 mg IV now. Labetalol 400 mg po at 8am PIH labs this am.  Donnel Saxon CNM 08/26/2014, 5:27 AM

## 2014-08-26 NOTE — Progress Notes (Signed)
Difficult to trace secondary maternal position & size.

## 2014-08-26 NOTE — Progress Notes (Signed)
Glucometer dysfunction, would not let me ID patient, bld. Sugar 79.

## 2014-08-26 NOTE — Progress Notes (Signed)
28 y.o. year old female,at [redacted]w[redacted]d gestation.  SUBJECTIVE:  No headaches, blurred vision, or right upper quadrant tenderness. The patient wants to go home.  OBJECTIVE:  BP 163/94 mmHg  Pulse 92  Temp(Src) 97.8 F (36.6 C) (Oral)  Resp 18  Ht 5\' 3"  (1.6 m)  Wt 303 lb 6.4 oz (137.621 kg)  BMI 53.76 kg/m2  SpO2 98%  Fetal Heart Tones:  Category 1  Abdomen: Nontender  ASSESSMENT:  [redacted]w[redacted]d Weeks Pregnancy  Hypertension  Diabetes  Obesity  PLAN:  Increase labetalol to 400 mg 3 times each day by mouth.  Blood pressure monitoring is a challenge because of the patient's obese upper arm. We are trying to find the optimal blood pressure cuff.  24 urine collection is complete at 9 PM. The results should be available in the morning.  Gildardo Cranker, M.D.

## 2014-08-26 NOTE — Progress Notes (Signed)
PROGRESS NOTE  I have reviewed the patient's vital signs, labs, and notes. I have examined the patient. I agree with the previous note from the Certified Nurse Midwife.   Blood pressure readings have been taken with a regular blood pressure cuff on the right forearm between the elbow and the right wrist. This isn't an accurate way to measure blood pressure. We will investigate a better way.  Continue to monitor blood pressures and urine test. Possible discharge tomorrow.  Gildardo Cranker, M.D. 08/26/2014

## 2014-08-26 NOTE — Progress Notes (Signed)
BP's set to be taken automatically q 10 minutes after Labetolol.  Pt up to BR, BP cuff removed.  Will reapply BP cuff once returned to bed.

## 2014-08-26 NOTE — Plan of Care (Signed)
Problem: Consults Goal: Birthing Suites Patient Information Press F2 to bring up selections list   Pt < [redacted] weeks EGA Goal: Skin Care Protocol Initiated - if Braden Score 18 or less If consults are not indicated, leave blank or document N/A  Outcome: Not Applicable Date Met:  02/26/93 Braden scale 22 on adm.  Problem: Phase I Progression Outcomes Goal: Diabetic Assessment Outcome: Progressing Pt. On carb. Mod. Diet, glybruride and FBS and 2 hr. Post. Prandial. Goal: No significant worsening bleeding/cervix change/vag drainage No significant worsening in vaginal bleeding, cervical change, or vaginal drainage.  Outcome: Progressing Pt. Stable. Goal: Contractions < 5-6/hour Outcome: Progressing Cont. Monitoring, contractions < 5-6 per hr. Goal: Maintains reassuring Fetal Heart Rate Outcome: Progressing Cont. Monitoring, FHR with occasional variables noted. Goal: Pain controlled with appropriate interventions Outcome: Progressing Pt. C/o back pain due to bed and position. Goal: OOB as tolerated unless otherwise ordered Outcome: Progressing Pt. With BRP.  Problem: Phase II Progression Outcomes Goal: Electronic fetal monitoring(Doppler,Continuous,Intermittent) EFM (Doppler, Continuous, Intermittent)  Outcome: Progressing Cont. EFM and Toco. Goal: Tolerating diet Outcome: Progressing Carb. Mod. Diet. Goal: Mattress in use Mattress in use Geneticist, molecular, Med/Surg, Regular, Birthing Suite, Other)  Outcome: Progressing Reg. Goal: Labs/tests as ordered Labs/tests as ordered (Magnesium level, CBG's, CBC, CMET, 24 hr Urine, Amniocentesis, Ultrasound, Other)  Outcome: Progressing More labs for in the AM.

## 2014-08-27 DIAGNOSIS — O119 Pre-existing hypertension with pre-eclampsia, unspecified trimester: Secondary | ICD-10-CM | POA: Diagnosis present

## 2014-08-27 LAB — COMPREHENSIVE METABOLIC PANEL
ALT: 16 U/L (ref 0–35)
AST: 18 U/L (ref 0–37)
Albumin: 2.2 g/dL — ABNORMAL LOW (ref 3.5–5.2)
Alkaline Phosphatase: 91 U/L (ref 39–117)
Anion gap: 8 (ref 5–15)
BUN: 10 mg/dL (ref 6–23)
CO2: 21 mmol/L (ref 19–32)
Calcium: 8.9 mg/dL (ref 8.4–10.5)
Chloride: 107 mEq/L (ref 96–112)
Creatinine, Ser: 0.63 mg/dL (ref 0.50–1.10)
GFR calc Af Amer: >90 mL/min (ref >90)
GFR calc non Af Amer: >90 mL/min (ref >90)
Glucose, Bld: 72 mg/dL (ref 70–99)
Potassium: 3.6 mmol/L (ref 3.5–5.1)
Sodium: 136 mmol/L (ref 135–145)
Total Bilirubin: 0.2 mg/dL — ABNORMAL LOW (ref 0.3–1.2)
Total Protein: 5.6 g/dL — ABNORMAL LOW (ref 6.0–8.3)

## 2014-08-27 LAB — CBC
HCT: 30.2 % — ABNORMAL LOW (ref 36.0–46.0)
Hemoglobin: 10.2 g/dL — ABNORMAL LOW (ref 12.0–15.0)
MCH: 28.7 pg (ref 26.0–34.0)
MCHC: 33.8 g/dL (ref 30.0–36.0)
MCV: 85.1 fL (ref 78.0–100.0)
Platelets: 310 10*3/uL (ref 150–400)
RBC: 3.55 MIL/uL — ABNORMAL LOW (ref 3.87–5.11)
RDW: 13.4 % (ref 11.5–15.5)
WBC: 8.7 10*3/uL (ref 4.0–10.5)

## 2014-08-27 LAB — PROTEIN, URINE, 24 HOUR
Collection Interval-UPROT: 24 hours
Protein, 24H Urine: 480 mg/d — ABNORMAL HIGH (ref <150)
Protein, Urine: 17 mg/dL (ref 5–24)
Urine Total Volume-UPROT: 2825 mL

## 2014-08-27 LAB — GLUCOSE, CAPILLARY
Glucose-Capillary: 76 mg/dL (ref 70–99)
Glucose-Capillary: 155 mg/dL — ABNORMAL HIGH (ref 70–99)
Glucose-Capillary: 119 mg/dL — ABNORMAL HIGH (ref 70–99)

## 2014-08-27 LAB — LACTATE DEHYDROGENASE: LDH: 138 U/L (ref 94–250)

## 2014-08-27 LAB — URIC ACID: Uric Acid, Serum: 4.1 mg/dL (ref 2.4–7.0)

## 2014-08-27 MED ORDER — LABETALOL HCL 200 MG PO TABS: 400.0000 mg | ORAL_TABLET | Freq: Three times a day (TID) | ORAL | Status: DC

## 2014-08-27 NOTE — Discharge Summary (Signed)
Physician Discharge Summary  Patient ID: Lauren Delgado MRN: [id number] DOB/AGE: October 19, 1985 28 y.o.  Admit date: 08/25/2014 Discharge date: 08/27/2014  Admission Diagnoses:   IUP at 35 weeks Chronic hypertension with exacerbation vs pre-eclampsia Type 2 DM GBS Hx HSV Morbid obesity  Discharge Diagnoses:  IUP at 35 weeks Chronic hypertension with exacerbation vs pre-eclampsia Type 2 DM GBS Hx HSV Morbid obesity  Discharged Condition: stable  Hospital Course:  Sent to [facility] for MFM consult on 08/24/14 due to elevated BP and elevated PCR.  Normal growth and BPP had been noted at the office.  Dr. Burnett Harry recommended admission for observation of BP, ensuring appropriate Labetalol regimen, serial PIH labs, 24 hour urine.  EFM remained reassuring throughout, with no contractions.  CBGs were overall WNL.  PIH labs were WNL.  24 hour urine protein = 480 gm.  She did require several doses of IV Labetalol for BP parameters.  Labetalol po dose was increased to 400 mg TID on 08/26/14.   BPs remained overall outside severe parameters, and Dr. Alesia Richards OK'd d/c on the afternoon of 08/27/14.  Patient is to continue Labetalol 400 mg po TID, Glyburide 2.5 mg po BID. She is to be compliant with meds and dietary control for diabetes management.  She is to rest.  She will have weekly BPP and weekly NST--has visit already scheduled 12/29, with BPP.   PIH precautions were reviewed with the patient and her mother. New Rx for Labetalol 400 mg po TID sent to patient's pharmacy.   Consults: MFM  Significant Diagnostic Studies: PIH labs WNL.  24 hour protein = 480.  Treatments: None  Discharge Exam: Blood pressure 149/79, pulse 87, temperature 97.5 F (36.4 C), temperature source Oral, resp. rate 20, height 5\' 3"  (1.6 m), weight 303 lb 6.4 oz (137.621 kg), SpO2 96 %. General appearance: alert Resp: clear to auscultation bilaterally Cardio: regular rate and rhythm, S1, S2 normal, no murmur, click, rub or  gallop Pelvic: Deferred Extremities: Homans sign is negative, no sign of DVT and DTR 1+, no clonus, 1+ edema  Disposition: 01-Home or Self Care     Medication List    TAKE these medications        acetaminophen 500 MG tablet  Commonly known as:  TYLENOL  Take 500 mg by mouth every 6 (six) hours as needed for mild pain.     glyBURIDE 2.5 MG tablet  Commonly known as:  DIABETA  Take 2.5 mg by mouth 2 (two) times daily.     hydrocortisone cream 1 %  Apply 1 application topically daily as needed (rash).     labetalol 200 MG tablet  Commonly known as:  NORMODYNE  Take 2 tablets (400 mg total) by mouth 3 (three) times daily.     PRENATAL VITAMINS PO  Take 1 each by mouth daily.     valACYclovir 500 MG tablet  Commonly known as:  VALTREX  Take 500 mg by mouth daily.       Follow-up Information    Follow up with [facility] On 08/31/2014.   Specialty:  Obstetrics and Gynecology   Why:  Call for any issues or concerns.  Keep scheduled appt on Tuesday, 08/31/14.  Will have addition of ultrasound to that visit.   Contact information:   [address] [place] [address] [phone]      Signed: Donnel Saxon 08/27/2014, 5:56 PM

## 2014-08-27 NOTE — Progress Notes (Addendum)
Hospital day # 2 pregnancy at 109w2d--Chronic hypertensioin with superimposed pre-eclampsia vs exacerbation of chronic HTN.  S:  Doing well--hoping to go home at some point.  Denies HA, visual sx, epigastric pain, cramping.  Reports +FM.      Perception of contractions: none      Vaginal bleeding: None       Vaginal discharge:  None  O: BP 168/97 mmHg  Pulse 87  Temp(Src) 98.2 F (36.8 C) (Oral)  Resp 18  Ht 5\' 3"  (1.6 m)  Wt 303 lb 6.4 oz (137.621 kg)  BMI 53.76 kg/m2  SpO2 96%      Fetal tracings:  Category 1 on TID tracings      Contractions:   Mild irritability on tracings      Uterus non-tender      Extremities: DTR 1+, no clonus, 1+ edema  Filed Vitals:   08/27/14 0404 08/27/14 0405 08/27/14 0505 08/27/14 0757  BP: 185/74 185/74 153/67 168/97  Pulse: 91 91 90 87  Temp:    98.2 F (36.8 C)  TempSrc:    Oral  Resp:  20 20 18   Height:      Weight:      SpO2:                Labs:   CBC    Component Value Date/Time   WBC 8.7 08/27/2014 0503   RBC 3.55* 08/27/2014 0503   HGB 10.2* 08/27/2014 0503   HCT 30.2* 08/27/2014 0503   PLT 310 08/27/2014 0503   MCV 85.1 08/27/2014 0503   MCH 28.7 08/27/2014 0503   MCHC 33.8 08/27/2014 0503   RDW 13.4 08/27/2014 0503   LYMPHSABS 1.4 04/30/2014 1634   MONOABS 0.6 04/30/2014 1634   EOSABS 0.0 04/30/2014 1634   BASOSABS 0.0 04/30/2014 1634   CMP Latest Ref Rng 08/27/2014 08/26/2014 08/25/2014  Glucose 70 - 99 mg/dL 72 74 128(H)  BUN 6 - 23 mg/dL 10 9 9   Creatinine 0.50 - 1.10 mg/dL 0.63 0.52 0.54  Sodium 135 - 145 mmol/L 136 138 134(L)  Potassium 3.5 - 5.1 mmol/L 3.6 3.6 4.0  Chloride 96 - 112 mEq/L 107 109 105  CO2 19 - 32 mmol/L 21 22 23   Calcium 8.4 - 10.5 mg/dL 8.9 8.8 8.8  Total Protein 6.0 - 8.3 g/dL 5.6(L) 6.4 6.0  Total Bilirubin 0.3 - 1.2 mg/dL 0.2(L) 0.3 0.3  Alkaline Phos 39 - 117 U/L 91 86 100  AST 0 - 37 U/L 18 18 17   ALT 0 - 35 U/L 16 16 17     CBG (last 3)   Recent Labs  08/26/14 1556  08/26/14 2239 08/27/14 0807  GLUCAP 96 150* 76   24 hour urine protein 480       Meds:  . docusate sodium  100 mg Oral Daily  . glyBURIDE  2.5 mg Oral BID  . labetalol  400 mg Oral TID  . prenatal multivitamin  1 tablet Oral Q1200  . sodium chloride  3 mL Intravenous Q12H  . valACYclovir  500 mg Oral Daily   Received IV Labetalol multiple times yesterday for BP parameters--none since MN. Labetalol po dose increased last night to 400 mg TID  A: [redacted]w[redacted]d with chronic hypertension with superimposed pre-eclampsia--no severe features at present Type 2 DM Morbid obesity   P: Continue current plan of care      Upcoming tests/treatments:  Daily labs, NST TID      MDs will follow--Dr. Alesia Richards will see  today for determination of plan of care.  Donnel Saxon CNM, [place] 08/27/2014 8:50 AM  I saw and examined patient at bedside and agree with above findings assessment and plan.  Additionally: Physical exam  CVS: S1-S2 regular rate and rhythm Pulmonary: Clear to auscultation bilaterally Extremities: Warm and well-perfused, 0+ patellar reflex  Continue with blood pressure checks every 2 hours.  Continue with oral antihypertensives and IV labetalol as needed. If blood pressures remain in then nonsevere range then may discharge the patient this afternoon after 4 PM. I reviewed with the patient's symptoms of preeclampsia to watch out for. We discussed  importance of medication compliance as ordered, eed for daily fetall kick counts, need for twice-weekly follow-up at the office where a weekly NST and a weekly biophysical profile will be done and weekly preeclampsia lab check. We discussed with patient plan for delivery after 37 weeks unless there is worsening maternal or fetal status. All questions were answered and patient expressed understanding, mother was also in the room during the visit  Patient has upcoming appointment in the office next Tuesday.  Dr. Alesia Richards.

## 2014-08-27 NOTE — Progress Notes (Signed)
Pt. Is stable and ready to be discharged home. All belongings with the patient. All discharge instructions given and reviewed. NST reactive and reassuring. Pt. Wheeled out via wheelchair to [employer] car. Verbalizes understanding of when to call MD/CNM.

## 2014-08-27 NOTE — Discharge Instructions (Signed)
Take Labetalol 400 mg (2 200 mg tablets) three times a day. Continue glyburide as usual. Call for any headache, blurry vision, abdominal pain, decreased fetal movement, or any other issue. Rest at home.  Preeclampsia and Eclampsia Preeclampsia is a serious condition that develops only during pregnancy. It is also called toxemia of pregnancy. This condition causes high blood pressure along with other symptoms, such as swelling and headaches. These may develop as the condition gets worse. Preeclampsia may occur 20 weeks or later into your pregnancy.  Diagnosing and treating preeclampsia early is very important. If not treated early, it can cause serious problems for you and your baby. One problem it can lead to is eclampsia, which is a condition that causes muscle jerking or shaking (convulsions) in the mother. Delivering your baby is the best treatment for preeclampsia or eclampsia.  RISK FACTORS The cause of preeclampsia is not known. You may be more likely to develop preeclampsia if you have certain risk factors. These include:   Being pregnant for the first time.  Having preeclampsia in a past pregnancy.  Having a family history of preeclampsia.  Having high blood pressure.  Being pregnant with twins or triplets.  Being 7 or older.  Being African American.  Having kidney disease or diabetes.  Having medical conditions such as lupus or blood diseases.  Being very overweight (obese). SIGNS AND SYMPTOMS  The earliest signs of preeclampsia are:  High blood pressure.  Increased protein in your urine. Your health care provider will check for this at every prenatal visit. Other symptoms that can develop include:   Severe headaches.  Sudden weight gain.  Swelling of your hands, face, legs, and feet.  Feeling sick to your stomach (nauseous) and throwing up (vomiting).  Vision problems (blurred or double vision).  Numbness in your face, arms, legs, and  feet.  Dizziness.  Slurred speech.  Sensitivity to bright lights.  Abdominal pain. DIAGNOSIS  There are no screening tests for preeclampsia. Your health care provider will ask you about symptoms and check for signs of preeclampsia during your prenatal visits. You may also have tests, including:  Urine testing.  Blood testing.  Checking your baby's heart rate.  Checking the health of your baby and your placenta using images created with sound waves (ultrasound). TREATMENT  You can work out the best treatment approach together with your health care provider. It is very important to keep all prenatal appointments. If you have an increased risk of preeclampsia, you may need more frequent prenatal exams.  Your health care provider may prescribe bed rest.  You may have to eat as little salt as possible.  You may need to take medicine to lower your blood pressure if the condition does not respond to more conservative measures.  You may need to stay in the hospital if your condition is severe. There, treatment will focus on controlling your blood pressure and fluid retention. You may also need to take medicine to prevent seizures.  If the condition gets worse, your baby may need to be delivered early to protect you and the baby. You may have your labor started with medicine (be induced), or you may have a cesarean delivery.  Preeclampsia usually goes away after the baby is born. HOME CARE INSTRUCTIONS   Only take over-the-counter or prescription medicines as directed by your health care provider.  Lie on your left side while resting. This keeps pressure off your baby.  Elevate your feet while resting.  Get regular exercise.  Ask your health care provider what type of exercise is safe for you.  Avoid caffeine and alcohol.  Do not smoke.  Drink 6-8 glasses of water every day.  Eat a balanced diet that is low in salt. Do not add salt to your food.  Avoid stressful situations as  much as possible.  Get plenty of rest and sleep.  Keep all prenatal appointments and tests as scheduled. SEEK MEDICAL CARE IF:  You are gaining more weight than expected.  You have any headaches, abdominal pain, or nausea.  You are bruising more than usual.  You feel dizzy or light-headed. SEEK IMMEDIATE MEDICAL CARE IF:   You develop sudden or severe swelling anywhere in your body. This usually happens in the legs.  You gain 5 lb (2.3 kg) or more in a week.  You have a severe headache, dizziness, problems with your vision, or confusion.  You have severe abdominal pain.  You have lasting nausea or vomiting.  You have a seizure.  You have trouble moving any part of your body.  You develop numbness in your body.  You have trouble speaking.  You have any abnormal bleeding.  You develop a stiff neck.  You pass out. MAKE SURE YOU:   Understand these instructions.  Will watch your condition.  Will get help right away if you are not doing well or get worse. Document Released: 08/17/2000 Document Revised: 08/25/2013 Document Reviewed: 06/12/2013 [facility] Patient Information 2015 [place], [place]. This information is not intended to replace advice given to you by your health care provider. Make sure you discuss any questions you have with your health care provider.

## 2014-09-07 ENCOUNTER — Other Ambulatory Visit: Payer: Self-pay | Admitting: Obstetrics and Gynecology

## 2014-09-07 ENCOUNTER — Inpatient Hospital Stay (HOSPITAL_COMMUNITY)
Admission: AD | Admit: 2014-09-07 | Discharge: 2014-09-14 | DRG: 765 | Disposition: A | Discharge status: Home / Self Care | Payer: Medicaid Other | Source: Ambulatory Visit | Attending: Obstetrics and Gynecology | Admitting: Obstetrics and Gynecology

## 2014-09-07 ENCOUNTER — Observation Stay (HOSPITAL_COMMUNITY): Payer: Medicaid Other

## 2014-09-07 ENCOUNTER — Encounter (HOSPITAL_COMMUNITY): Payer: Self-pay | Admitting: Obstetrics

## 2014-09-07 DIAGNOSIS — O99824 Streptococcus B carrier state complicating childbirth: Principal | ICD-10-CM | POA: Diagnosis present

## 2014-09-07 DIAGNOSIS — D649 Anemia, unspecified: Secondary | ICD-10-CM | POA: Diagnosis present

## 2014-09-07 DIAGNOSIS — O119 Pre-existing hypertension with pre-eclampsia, unspecified trimester: Secondary | ICD-10-CM

## 2014-09-07 DIAGNOSIS — O24419 Gestational diabetes mellitus in pregnancy, unspecified control: Secondary | ICD-10-CM | POA: Diagnosis present

## 2014-09-07 DIAGNOSIS — O99214 Obesity complicating childbirth: Secondary | ICD-10-CM | POA: Diagnosis present

## 2014-09-07 DIAGNOSIS — Z6841 Body Mass Index (BMI) 40.0 and over, adult: Secondary | ICD-10-CM

## 2014-09-07 DIAGNOSIS — Z3A36 36 weeks gestation of pregnancy: Secondary | ICD-10-CM | POA: Diagnosis present

## 2014-09-07 DIAGNOSIS — O163 Unspecified maternal hypertension, third trimester: Secondary | ICD-10-CM

## 2014-09-07 DIAGNOSIS — Z98891 History of uterine scar from previous surgery: Secondary | ICD-10-CM

## 2014-09-07 DIAGNOSIS — O10019 Pre-existing essential hypertension complicating pregnancy, unspecified trimester: Secondary | ICD-10-CM | POA: Diagnosis present

## 2014-09-07 DIAGNOSIS — O113 Pre-existing hypertension with pre-eclampsia, third trimester: Secondary | ICD-10-CM | POA: Diagnosis present

## 2014-09-07 DIAGNOSIS — O9902 Anemia complicating childbirth: Secondary | ICD-10-CM | POA: Diagnosis present

## 2014-09-07 HISTORY — DX: Pre-existing hypertension with pre-eclampsia, unspecified trimester: O11.9

## 2014-09-07 LAB — COMPREHENSIVE METABOLIC PANEL
ALT: 15 U/L (ref 0–35)
AST: 19 U/L (ref 0–37)
Albumin: 2.5 g/dL — ABNORMAL LOW (ref 3.5–5.2)
Alkaline Phosphatase: 110 U/L (ref 39–117)
Anion gap: 7 (ref 5–15)
BUN: 7 mg/dL (ref 6–23)
CO2: 20 mmol/L (ref 19–32)
Calcium: 8.7 mg/dL (ref 8.4–10.5)
Chloride: 109 mEq/L (ref 96–112)
Creatinine, Ser: 0.7 mg/dL (ref 0.50–1.10)
GFR calc Af Amer: >90 mL/min (ref >90)
GFR calc non Af Amer: >90 mL/min (ref >90)
Glucose, Bld: 104 mg/dL — ABNORMAL HIGH (ref 70–99)
Potassium: 3.3 mmol/L — ABNORMAL LOW (ref 3.5–5.1)
Sodium: 136 mmol/L (ref 135–145)
Total Bilirubin: 0.1 mg/dL — ABNORMAL LOW (ref 0.3–1.2)
Total Protein: 6 g/dL (ref 6.0–8.3)

## 2014-09-07 LAB — CBC
HCT: 31.6 % — ABNORMAL LOW (ref 36.0–46.0)
Hemoglobin: 10.8 g/dL — ABNORMAL LOW (ref 12.0–15.0)
MCH: 28.7 pg (ref 26.0–34.0)
MCHC: 34.2 g/dL (ref 30.0–36.0)
MCV: 84 fL (ref 78.0–100.0)
Platelets: 298 10*3/uL (ref 150–400)
RBC: 3.76 MIL/uL — ABNORMAL LOW (ref 3.87–5.11)
RDW: 14.3 % (ref 11.5–15.5)
WBC: 9.5 10*3/uL (ref 4.0–10.5)

## 2014-09-07 LAB — URIC ACID: Uric Acid, Serum: 5 mg/dL (ref 2.4–7.0)

## 2014-09-07 LAB — LACTATE DEHYDROGENASE: LDH: 180 U/L (ref 94–250)

## 2014-09-07 LAB — GLUCOSE, CAPILLARY: Glucose-Capillary: 109 mg/dL — ABNORMAL HIGH (ref 70–99)

## 2014-09-07 MED ORDER — GLYBURIDE 2.5 MG PO TABS
2.5000 mg | ORAL_TABLET | Freq: Once | ORAL | Status: AC
  Administered 2014-09-07: 2.5 mg via ORAL
  Filled 2014-09-07: qty 1

## 2014-09-07 MED ORDER — PRENATAL MULTIVITAMIN CH: 1.0000 | ORAL_TABLET | Freq: Every day | ORAL | Status: DC

## 2014-09-07 MED ORDER — CALCIUM CARBONATE ANTACID 500 MG PO CHEW: 2.0000 | CHEWABLE_TABLET | ORAL | Status: DC | PRN

## 2014-09-07 MED ORDER — LABETALOL HCL 5 MG/ML IV SOLN
10.0000 mg | INTRAVENOUS | Status: DC | PRN
  Administered 2014-09-07: 20 mg via INTRAVENOUS
  Administered 2014-09-08: 40 mg via INTRAVENOUS
  Administered 2014-09-08: 10 mg via INTRAVENOUS
  Administered 2014-09-10: 20 mg via INTRAVENOUS
  Administered 2014-09-10: 10 mg via INTRAVENOUS
  Filled 2014-09-07: qty 4
  Filled 2014-09-07: qty 8
  Filled 2014-09-07: qty 4

## 2014-09-07 MED ORDER — ZOLPIDEM TARTRATE 5 MG PO TABS: 5.0000 mg | ORAL_TABLET | Freq: Every evening | ORAL | Status: DC | PRN

## 2014-09-07 MED ORDER — ACETAMINOPHEN 325 MG PO TABS: 650.0000 mg | ORAL_TABLET | ORAL | Status: DC | PRN

## 2014-09-07 MED ORDER — DOCUSATE SODIUM 100 MG PO CAPS: 100.0000 mg | ORAL_CAPSULE | Freq: Every day | ORAL | Status: DC

## 2014-09-07 MED ORDER — LABETALOL HCL 200 MG PO TABS
400.0000 mg | ORAL_TABLET | Freq: Three times a day (TID) | ORAL | Status: DC
  Administered 2014-09-07 – 2014-09-10 (×8): 400 mg via ORAL
  Filled 2014-09-07 (×13): qty 2

## 2014-09-07 MED ORDER — LABETALOL HCL 5 MG/ML IV SOLN
10.0000 mg | INTRAVENOUS | Status: AC | PRN
  Administered 2014-09-07: 20 mg via INTRAVENOUS
  Administered 2014-09-07 (×2): 10 mg via INTRAVENOUS
  Filled 2014-09-07: qty 8

## 2014-09-07 NOTE — H&P (Signed)
Lauren Delgado is a 29 y.o. female, G1P0 at 36.6 weeks, presenting for evaluation after failed BPP in office.  BPP 6/10, -4 for tone and movement.  Patient to have repeat BPP and then assess whether appropriate for IOL or Primary C/S.  Patient is GBS positive and suffers from [facility] and DM-II.  She is currently managing these conditions with Labetalol 400 TID and Glyburide 2.5 BID.    Patient Active Problem List   Diagnosis Date Noted  . Pre-eclampsia superimposed on chronic hypertension, antepartum 09/07/2014  . Pre-eclampsia added to pre-existing hypertension 08/27/2014  . Gestational hypertension 08/26/2014  . Positive GBS test 08/25/2014  . Chronic hypertension with exacerbation during pregnancy in third trimester 08/25/2014  . Rubella non-immune status, antepartum 08/25/2014  . Varicella non-immune 08/25/2014  . Type 2 diabetes mellitus affecting pregnancy in second trimester, antepartum 06/07/2014  . HYPOTHYROIDISM, BORDERLINE 12/19/2006  . Polycystic Ovaries 10/31/2006  . OBESITY--BMI 54.5 10/31/2006  . Essential Hypertension, Benign 10/31/2006    History of present pregnancy: Patient entered care at 11.1 weeks.   EDC of 09/29/2014 was established by Definite LMP of 12/23/2013.   Anatomy scan: 19 weeks, with normal, but limited, findings and an anterior placenta.   Additional [place] evaluations:  -  Anatomy scan EFW 6 oz. [place] IUP. Breech pres. Anterior placenta. Placenta edge is 5.5 cm from internal OS. WNL. Cx closed. Fluid normal. Vertical pocket = 4.3 cm. No apparent abnormalities seen. Eval limited by maternal habitus and early [place]. NB seen.  -21.5wks F/U Anatomy: Single gestation measuring at the 13th percentile. Her cervix measured 3.8 cm. A three-vessel cord and certain views of the babies face were not seen today. Amniotic fluid volume is normal.  -28.1wks: [place]: SIUP, VERTEX,NORMAL FLUID, GROWTH 24%, CX 3.86 CM.  -32 wks: BPP 8/8, Cervix 3.31cm, Growth 20th%ile -33.1wks: BPP  8/8 -33.5wks: [place]: SIUP, VERTEX, NORMAL FLUID, BPP 8/8 -34.6wks: BPP 8/8 -35.6wks: BPP 8/8, Normal Fluid -36.6wks: BPP 6/10 EFW 6lbs 9oz (00XF%) AFI 13 Cephalic Significant prenatal events:  Admitted to antenatal 08/24/14-08/27/14.  Patient had some complaints of diarrhea, cold symptoms, and pain with ambulation.  Patient required several modifications in medications.  Patient scheduled to be seen by endocrinologist, ophthalmologist, and required Fetal Echo.  Per patient all appts attended, but no formal reports obtained.  Patient also reports that fetal echo normal.  Last evaluation:  09/07/2014 by Dr. Carmon Sails FHR 140, BPP 6/10, BP 126/94, Wt 308lbs  OB History    Gravida Para Term Preterm AB TAB SAB Ectopic Multiple Living   1         0     Past Medical History  Diagnosis Date  . Gestational diabetes mellitus, antepartum   . Hypertension    Past Surgical History  Procedure Laterality Date  . No past surgeries     Family History: family history includes Arthritis in her father, maternal grandfather, maternal grandmother, mother, paternal grandfather, and paternal grandmother; Asthma in her brother; COPD in her maternal aunt and maternal grandfather; Cancer in her maternal aunt; Depression in her brother and mother; Diabetes in her maternal grandfather, maternal grandmother, paternal grandfather, and paternal grandmother; Hearing loss in her maternal grandfather; Heart disease in her maternal uncle; Hypertension in her brother, father, maternal aunt, maternal grandfather, maternal grandmother, maternal uncle, mother, paternal aunt, paternal grandfather, paternal grandmother, and paternal uncle; Learning disabilities in her brother, maternal uncle, and paternal uncle; Mental retardation in her maternal uncle and paternal uncle; 43 / [place] in  her maternal aunt and mother; Stroke in her maternal grandmother; Varicose Veins in her maternal grandmother; Vision loss in her  father. Social History:  reports that she has never smoked. She has never used smokeless tobacco. She reports that she does not drink alcohol or use illicit drugs.   Prenatal Transfer Tool  Maternal Diabetes: Yes:  Diabetes Type:  Pre-pregnancy Genetic Screening: Normal Maternal Ultrasounds/Referrals: Normal Fetal Ultrasounds or other Referrals:  Fetal echo Referral, Report never received Maternal Substance Abuse:  No Significant Maternal Medications:  Meds include: Other: Labetalol, Valtrex, Zofran, Glyburide Significant Maternal Lab Results: Lab values include: Group B Strep positive    ROS:  +FM, -LoF, -VB, -Ctx, -HA, -Visual Disturbance, -HA  No Known Allergies     Blood pressure 201/97, pulse 83, temperature 97.9 F (36.6 C), temperature source Oral, resp. rate 20, height 5\' 2"  (1.575 m), weight 308 lb (139.708 kg).   FHR: 140 bpm, Mod Var, -Decels, -Accels UCs:  Q1-89min, mild contractions graphed  Prenatal labs: ABO, Rh: --/--/B POS, B POS (12/23 1709) Antibody: NEG (12/23 1709) Rubella:   Non-Immune RPR: Nonreactive (07/02 0000)  HBsAg: Negative (07/02 0000)  HIV: Non-reactive (07/02 0000)  GBS:  Positive Sickle cell/Hgb electrophoresis:  Normal Pap:  Normal GC:  Negative Chlamydia:  Negative Genetic screenings:  Negative AFP Glucola:  Confirmed Type II Other:  HSV-1 & HSV-2; Positive, Hepatitis C-Negative    Assessment IUP at 36.6wks Cat I FT CHTN with Superimposed PreEclampsia DM-Type II Failed Antenatal Testing   Plan: Ad[facility] to [facility] per consult with Dr.T. Trumaine Wimer  Routine Labor and Delivery Orders per [facility] Protocol Repeat BPP to determine fetal status-if stable IOL, unstable PC/S  Lauren Delgado, Lauren LYNNCNM, MSN 09/07/2014, 9:45 PM  Patient seen and examined agree with above. Plan for induction of labor  Pending bpp. Given chtn with superimposed preeclampsia and severe range blood pressures. Plan of care discussed with the patient.

## 2014-09-07 NOTE — Progress Notes (Signed)
Lauren Delgado, [id number]   Subjective -In room to assess patient after nurse call regarding blood pressures.  Patient denies visual disturbances, HA, epigastric pain, and numbness tingling.  Patient reports good fetal movement and denies LOF and VB.  Patient is not perceptive to contractions.   Objective Filed Vitals:   09/07/14 2058 09/07/14 2129 09/07/14 2141 09/07/14 2156  BP: 200/101 201/97 195/104 145/72  Pulse: 83 83 86 91  Temp:      TempSrc:      Resp:   20 20  Height: 5\' 2"  (1.575 m)     Weight: 308 lb (139.708 kg)      Physical Exam  Constitutional: She is oriented to person, place, and time and well-developed, well-nourished, and in no distress.  HENT:  Head: Normocephalic and atraumatic.  Cardiovascular: Normal rate, regular rhythm and normal heart sounds.   Pulmonary/Chest: Effort normal and breath sounds normal.  Abdominal: Soft. Bowel sounds are normal.  Genitourinary:  Deferred   Musculoskeletal: Normal range of motion.  Neurological: She is alert and oriented to person, place, and time.  Reflex Scores:      Bicep reflexes are 2+ on the right side and 2+ on the left side.      Patellar reflexes are 2+ on the right side and 2+ on the left side. Skin: Skin is warm and dry.  Psychiatric: Affect and judgment normal.   FHR: 135 bpm, Mod Var, -Decels, +Accels UC:  None palpated  Assessment IUP at 36.6wks Cat I FT CHTN with Superimposed PreEclampsia DM Type II  Plan -Discussed POC to obtain BPP and reassess fetal status -Will proceed with IOL if BPP reassuring or P C/S if nonreassuring -Dr. Scotty Delgado in room to assess patient and further discuss POC, questions and concerns addressed -Treat elevated bps with IV Labetalol, will consider hydralazine if control not obtained -Will reassess prn -Continue other mgmt as ordered   Lauren Delgado, Lauren Delgado, CNM 09/07/2014, 10:07 PM

## 2014-09-08 DIAGNOSIS — D649 Anemia, unspecified: Secondary | ICD-10-CM | POA: Diagnosis present

## 2014-09-08 DIAGNOSIS — O113 Pre-existing hypertension with pre-eclampsia, third trimester: Secondary | ICD-10-CM | POA: Diagnosis present

## 2014-09-08 DIAGNOSIS — Z6841 Body Mass Index (BMI) 40.0 and over, adult: Secondary | ICD-10-CM | POA: Diagnosis not present

## 2014-09-08 DIAGNOSIS — Z3A36 36 weeks gestation of pregnancy: Secondary | ICD-10-CM | POA: Diagnosis present

## 2014-09-08 DIAGNOSIS — O99824 Streptococcus B carrier state complicating childbirth: Secondary | ICD-10-CM | POA: Diagnosis present

## 2014-09-08 DIAGNOSIS — O24419 Gestational diabetes mellitus in pregnancy, unspecified control: Secondary | ICD-10-CM | POA: Diagnosis present

## 2014-09-08 DIAGNOSIS — Z3403 Encounter for supervision of normal first pregnancy, third trimester: Secondary | ICD-10-CM | POA: Diagnosis present

## 2014-09-08 DIAGNOSIS — O99214 Obesity complicating childbirth: Secondary | ICD-10-CM | POA: Diagnosis present

## 2014-09-08 DIAGNOSIS — O9902 Anemia complicating childbirth: Secondary | ICD-10-CM | POA: Diagnosis present

## 2014-09-08 LAB — PROTEIN / CREATININE RATIO, URINE
Creatinine, Urine: 81 mg/dL
Protein Creatinine Ratio: 0.96 — ABNORMAL HIGH (ref 0.00–0.15)
Total Protein, Urine: 78 mg/dL

## 2014-09-08 LAB — RPR

## 2014-09-08 LAB — CBC
HCT: 32 % — ABNORMAL LOW (ref 36.0–46.0)
Hemoglobin: 11.1 g/dL — ABNORMAL LOW (ref 12.0–15.0)
MCH: 29.3 pg (ref 26.0–34.0)
MCHC: 34.7 g/dL (ref 30.0–36.0)
MCV: 84.4 fL (ref 78.0–100.0)
Platelets: 273 10*3/uL (ref 150–400)
RBC: 3.79 MIL/uL — ABNORMAL LOW (ref 3.87–5.11)
RDW: 14.5 % (ref 11.5–15.5)
WBC: 10 10*3/uL (ref 4.0–10.5)

## 2014-09-08 LAB — COMPREHENSIVE METABOLIC PANEL
ALT: 15 U/L (ref 0–35)
AST: 19 U/L (ref 0–37)
Albumin: 2.4 g/dL — ABNORMAL LOW (ref 3.5–5.2)
Alkaline Phosphatase: 111 U/L (ref 39–117)
Anion gap: 7 (ref 5–15)
BUN: 7 mg/dL (ref 6–23)
CO2: 20 mmol/L (ref 19–32)
Calcium: 8.6 mg/dL (ref 8.4–10.5)
Chloride: 109 mEq/L (ref 96–112)
Creatinine, Ser: 0.62 mg/dL (ref 0.50–1.10)
GFR calc Af Amer: >90 mL/min (ref >90)
GFR calc non Af Amer: >90 mL/min (ref >90)
Glucose, Bld: 142 mg/dL — ABNORMAL HIGH (ref 70–99)
Potassium: 3.3 mmol/L — ABNORMAL LOW (ref 3.5–5.1)
Sodium: 136 mmol/L (ref 135–145)
Total Bilirubin: 0.2 mg/dL — ABNORMAL LOW (ref 0.3–1.2)
Total Protein: 5.7 g/dL — ABNORMAL LOW (ref 6.0–8.3)

## 2014-09-08 LAB — LACTATE DEHYDROGENASE: LDH: 193 U/L (ref 94–250)

## 2014-09-08 LAB — GLUCOSE, CAPILLARY
Glucose-Capillary: 96 mg/dL (ref 70–99)
Glucose-Capillary: 146 mg/dL — ABNORMAL HIGH (ref 70–99)
Glucose-Capillary: 153 mg/dL — ABNORMAL HIGH (ref 70–99)

## 2014-09-08 LAB — PREPARE RBC (CROSSMATCH)

## 2014-09-08 LAB — URIC ACID: Uric Acid, Serum: 5.1 mg/dL (ref 2.4–7.0)

## 2014-09-08 MED ORDER — OXYTOCIN 40 UNITS IN LACTATED RINGERS INFUSION - SIMPLE MED
62.5000 mL/h | INTRAVENOUS | Status: DC
  Filled 2014-09-08: qty 1000

## 2014-09-08 MED ORDER — HYDRALAZINE HCL 20 MG/ML IJ SOLN
10.0000 mg | Freq: Once | INTRAMUSCULAR | Status: AC
  Administered 2014-09-08: 10 mg via INTRAVENOUS
  Filled 2014-09-08: qty 1

## 2014-09-08 MED ORDER — HYDRALAZINE HCL 20 MG/ML IJ SOLN
20.0000 mg | INTRAMUSCULAR | Status: DC | PRN
  Administered 2014-09-08 – 2014-09-10 (×6): 20 mg via INTRAVENOUS
  Administered 2014-09-10: 5 mg via INTRAVENOUS
  Filled 2014-09-08 (×6): qty 1

## 2014-09-08 MED ORDER — VALACYCLOVIR HCL 500 MG PO TABS
500.0000 mg | ORAL_TABLET | Freq: Every day | ORAL | Status: DC
  Filled 2014-09-08: qty 1

## 2014-09-08 MED ORDER — MISOPROSTOL 25 MCG QUARTER TABLET
25.0000 ug | ORAL_TABLET | ORAL | Status: DC | PRN
  Administered 2014-09-08 (×5): 25 ug via VAGINAL
  Filled 2014-09-08 (×5): qty 0.25

## 2014-09-08 MED ORDER — LIDOCAINE HCL (PF) 1 % IJ SOLN: 30.0000 mL | INTRAMUSCULAR | Status: DC | PRN

## 2014-09-08 MED ORDER — GLYBURIDE 2.5 MG PO TABS
2.5000 mg | ORAL_TABLET | Freq: Two times a day (BID) | ORAL | Status: DC
  Administered 2014-09-08 (×2): 2.5 mg via ORAL
  Filled 2014-09-08 (×3): qty 1

## 2014-09-08 MED ORDER — ONDANSETRON HCL 4 MG/2ML IJ SOLN: 4.0000 mg | Freq: Four times a day (QID) | INTRAMUSCULAR | Status: DC | PRN

## 2014-09-08 MED ORDER — CITRIC ACID-SODIUM CITRATE 334-500 MG/5ML PO SOLN
30.0000 mL | ORAL | Status: DC | PRN
  Administered 2014-09-09: 30 mL via ORAL
  Filled 2014-09-08: qty 15

## 2014-09-08 MED ORDER — LACTATED RINGERS IV SOLN
500.0000 mL | INTRAVENOUS | Status: DC | PRN
  Administered 2014-09-09 (×2): 300 mL via INTRAVENOUS

## 2014-09-08 MED ORDER — PANTOPRAZOLE SODIUM 40 MG PO TBEC: 40.0000 mg | DELAYED_RELEASE_TABLET | Freq: Every day | ORAL | Status: DC | PRN

## 2014-09-08 MED ORDER — LACTATED RINGERS IV SOLN
INTRAVENOUS | Status: DC
  Administered 2014-09-08 – 2014-09-09 (×5): via INTRAVENOUS

## 2014-09-08 MED ORDER — OXYCODONE-ACETAMINOPHEN 5-325 MG PO TABS: 1.0000 | ORAL_TABLET | ORAL | Status: DC | PRN

## 2014-09-08 MED ORDER — VALACYCLOVIR HCL 500 MG PO TABS
500.0000 mg | ORAL_TABLET | Freq: Every day | ORAL | Status: DC
  Administered 2014-09-08 – 2014-09-13 (×7): 500 mg via ORAL
  Filled 2014-09-08 (×7): qty 1

## 2014-09-08 MED ORDER — PANTOPRAZOLE SODIUM 40 MG PO TBEC: 40.0000 mg | DELAYED_RELEASE_TABLET | Freq: Every day | ORAL | Status: DC

## 2014-09-08 MED ORDER — NALBUPHINE HCL 10 MG/ML IJ SOLN: 10.0000 mg | INTRAMUSCULAR | Status: DC | PRN

## 2014-09-08 MED ORDER — ACETAMINOPHEN 325 MG PO TABS: 650.0000 mg | ORAL_TABLET | ORAL | Status: DC | PRN

## 2014-09-08 MED ORDER — OXYCODONE-ACETAMINOPHEN 5-325 MG PO TABS: 2.0000 | ORAL_TABLET | ORAL | Status: DC | PRN

## 2014-09-08 MED ORDER — MAGNESIUM SULFATE BOLUS VIA INFUSION
4.0000 g | Freq: Once | INTRAVENOUS | Status: AC
  Administered 2014-09-08: 4 g via INTRAVENOUS
  Filled 2014-09-08: qty 500

## 2014-09-08 MED ORDER — OXYTOCIN BOLUS FROM INFUSION
500.0000 mL | INTRAVENOUS | Status: DC
Start: 2014-09-08 — End: 2014-09-09

## 2014-09-08 MED ORDER — TERBUTALINE SULFATE 1 MG/ML IJ SOLN: 0.2500 mg | Freq: Once | INTRAMUSCULAR | Status: AC | PRN

## 2014-09-08 MED ORDER — MAGNESIUM SULFATE 40 G IN LACTATED RINGERS - SIMPLE
1.0000 g/h | INTRAVENOUS | Status: AC
  Administered 2014-09-08 – 2014-09-10 (×3): 2 g/h via INTRAVENOUS
  Filled 2014-09-08 (×3): qty 500

## 2014-09-08 NOTE — Progress Notes (Signed)
PHILOMENE HAFF MRN: [id number]  Subjective: -Nurse call stating that bp remain elevated. Patient continues to deny symptoms.    Objective: BP 174/94 mmHg  Pulse 95  Temp(Src) 97.4 F (36.3 C) (Oral)  Resp 20  Ht 5\' 2"  (1.575 m)  Wt 308 lb (139.708 kg)  BMI 56.32 kg/m2   Total I/O In: -  Out: 450 [Urine:450] FHT:  bpm, Mod Var, -Decels, +Accels UC:    SVE:     Membranes: Intact  Pitocin: None Cytotec: 1st Dose placed at 0230  Assessment:  IUP at 37wks Cat I FT  CHTN with SI PreEclampsia DM Type II Cervical Ripening  Plan: -Dr. Scotty Court consulted and advised as below -Start MgSO4 Infusion 4/2 -Give Hydralazine 10-20mg  IV Q2hrs PRN -Start IOL process -Continue other mgmt as ordered   Karolyne Timmons LYNN,MSN, CNM 09/08/2014, 2:04 AM

## 2014-09-08 NOTE — Progress Notes (Signed)
Labor Progress  Subjective: Comfortable, had dinner.  Visiting with family.  Objective: BP 186/92 mmHg  Pulse 90  Temp(Src) 97.7 F (36.5 C) (Oral)  Resp 20  Ht 5\' 2"  (1.575 m)  Wt 308 lb (139.708 kg)  BMI 56.32 kg/m2 I/O last 3 completed shifts: In: 3460.8 [P.O.:1325; I.V.:2135.8] Out: 5621 [Urine:3650] Total I/O In: 26 [P.O.:680; I.V.:250] Out: 600 [Urine:600] FHT: 135, very difficult to monitor CTX:  none Uterus gravid, soft non tender SVE:  Dilation: Fingertip Effacement (%):  ("thinning") Station: Ballotable Exam by:: VLatham CNM    . glyBURIDE  2.5 mg Oral BID WC  . labetalol  400 mg Oral 3 times per day  . valACYclovir  500 mg Oral Daily    Assessment:  IUP at 37.0 weeks IOL for CHTN NICHD: Category Membranes:  intact Labor progress: IOL for CHTN, PreX, and Type 2 DM, GBS: positive A1GDM: at [place] 2gms cytotec #4 placed at [place]: Continue labor plan Continuous monitoring Rest/Ambulate Will reassess with cervical exam at 1100 or earlier if necessary     Lauren Delgado, CNM, MSN 09/08/2014. 10:19 PM

## 2014-09-08 NOTE — Progress Notes (Addendum)
  Subjective: Has slept at intervals.  Denies any pain.  Objective: BP 160/71 mmHg  Pulse 94  Temp(Src) 98 F (36.7 C) (Oral)  Resp 20  Ht 5\' 2"  (1.575 m)  Wt 308 lb (139.708 kg)  BMI 56.32 kg/m2 I/O last 3 completed shifts: In: 1030.8 [P.O.:395; I.V.:635.8] Out: 1450 [Urine:1450] Total I/O In: 1225 [P.O.:600; I.V.:625] Out: 750 [Urine:750]   Filed Vitals:   09/08/14 0810 09/08/14 0930 09/08/14 1030 09/08/14 1142  BP: 131/73 147/68 151/71 160/71  Pulse: 93 94 91 94  Temp: 98 F (36.7 C)     TempSrc: Oral     Resp: 20 20 20 20   Height:      Weight:        FHT: Category UC:   Very occasional irritability SVE:   Dilation: Closed Effacement (%):  ("thinning") Station: Ballotable Exam by:: Windy Kalata CNM Cervix still very posterior, slightly thinner than before, slightly softer 3rd dose Cytotech placed in posterior fornix  PIH labs repeated at 0915.  CBC Latest Ref Rng 09/08/2014 09/07/2014 08/27/2014  WBC 4.0 - 10.5 K/uL 10.0 9.5 8.7  Hemoglobin 12.0 - 15.0 g/dL 11.1(L) 10.8(L) 10.2(L)  Hematocrit 36.0 - 46.0 % 32.0(L) 31.6(L) 30.2(L)  Platelets 150 - 400 K/uL 273 298 310    CMP Latest Ref Rng 09/08/2014 09/07/2014 08/27/2014  Glucose 70 - 99 mg/dL 142(H) 104(H) 72  BUN 6 - 23 mg/dL 7 7 10   Creatinine 0.50 - 1.10 mg/dL 0.62 0.70 0.63  Sodium 135 - 145 mmol/L 136 136 136  Potassium 3.5 - 5.1 mmol/L 3.3(L) 3.3(L) 3.6  Chloride 96 - 112 mEq/L 109 109 107  CO2 19 - 32 mmol/L 20 20 21   Calcium 8.4 - 10.5 mg/dL 8.6 8.7 8.9  Total Protein 6.0 - 8.3 g/dL 5.7(L) 6.0 5.6(L)  Total Bilirubin 0.3 - 1.2 mg/dL 0.2(L) 0.1(L) 0.2(L)  Alkaline Phos 39 - 117 U/L 111 110 91  AST 0 - 37 U/L 19 19 18   ALT 0 - 35 U/L 15 15 16     CBG (last 3)   Recent Labs  09/07/14 2118 09/08/14 0820  GLUCAP 109* 96     Assessment:  Induction for chronic hypertension with superimposed pre-eclampsia Type 2 DM GBS positive  Plan: Continue current care. Start GBS prophylaxis with onset of  early/active labor. Continue to allow regular diet while in cervical ripening phase of induction. FBS and 2 hour PCs at present.  Donnel Saxon CNM 09/08/2014, 1:13 PM

## 2014-09-08 NOTE — Progress Notes (Signed)
Lauren Delgado, [id number]   Subjective -Patient s/p MgSO4 bolus and infusion going.  Patient sleeping, but easily awakened by touch.  Patient continues to deny HA, visual disturbances, epigastric pain, and SOB.  Mother remains at bedside.   Objective Filed Vitals:   09/08/14 0232  BP: 163/65  Pulse: 91  Temp:   Resp: 20     Total I/O In: 235.8 [P.O.:100; I.V.:135.8] Out: 600 [Urine:600]  Physical Exam  Constitutional: She is oriented to person, place, and time.  Cardiovascular: Normal rate and regular rhythm.   Pulmonary/Chest: Effort normal and breath sounds normal.  Musculoskeletal: Normal range of motion.  Neurological: She is oriented to person, place, and time.  Reflex Scores:      Bicep reflexes are 2+ on the right side and 2+ on the left side.      Patellar reflexes are 2+ on the right side and 2+ on the left side. Skin: Skin is warm and dry.   [id number]: Closed Effacement (%): Thick Cervical Position: Posterior Station: Ballotable Presentation: Vertex Exam by:: Gavin Pound CNM  Pelvis: -Proven:No -Adequate: Yes [place]: -Position:Vertex by [place] -EFW:UTD d/t habitus  FHR: 120 bpm, Mod Var, -Decels, +Accels UC:  Uterine irritability graphed  Induction/Augmentation Agent: Pitocin: None Cytotec: 1st Dose at 0330 Membranes: Intact  Assessment IUP at 37wks Cat I FT Bishop Score: 1 Cervical Ripening  Plan -Discussed r/b of induction including fetal distress, serial induction, pain, and increased risk of c/s delivery -Discussed induction method with cytotec -Discussed usage and effects of MgSO4 -Questions and concerns addressed -Continue other mgmt as ordered  Lauren Delgado, CNM 09/08/2014, 3:25 AM

## 2014-09-08 NOTE — Progress Notes (Signed)
  Subjective: Has slept at intervals--mother at bedside.  Patient denies HA,visual sx, or epigastric pain.  Objective: BP 167/68 mmHg  Pulse 97  Temp(Src) 97.6 F (36.4 C) (Oral)  Resp 20  Ht 5\' 2"  (1.575 m)  Wt 308 lb (139.708 kg)  BMI 56.32 kg/m2 I/O last 3 completed shifts: In: 1030.8 [P.O.:395; I.V.:635.8] Out: 1450 [Urine:1450]   Magnesium at 2 gm/hr.  Filed Vitals:   09/08/14 0545 09/08/14 0602 09/08/14 0630 09/08/14 0700  BP: 138/73 167/65 169/79 167/68  Pulse: 105 103 99 97  Temp:      TempSrc:      Resp: 20 20 20 20   Height:      Weight:       Received IV Hydralizine at 0049 (10 mg) and 0454 (20 mg)--better response for BP parameters than IV Labetalol Received routine dose of po Labetalol 400 mg at 0709     CBG (last 3)   Recent Labs  09/07/14 2118  GLUCAP 109*  FBS 93  FHT: Category 2, but reassuring, segments of moderate variability, no true accels. UC:   Very occasional irritability SVE:  Cervix very posterior, closed, long, vtx, -3, ballotable. 2nd dose Cytotech placed in posterior fornix at 0815  Chest clear Heart RRR without murmur Abd gravid, pannus noted, NT Ext DTR 2+, no clonus, 1+ edema, SCDs in place  I/O balance since admission:  - 419 cc Output last shift 1450 cc  Scheduled Meds: . glyBURIDE  2.5 mg Oral BID WC  . labetalol  400 mg Oral 3 times per day  . valACYclovir  500 mg Oral Daily   PRN Meds: Hydralazine IV for BP parameters  Assessment:  IUP at 37 weeks Chronic hypertension with superimposed pre-eclampsia Type 2 DM Morbid obesity--BMI 56.5 GBS positive Rubella non-immune Varicella non-immune Hypothyroidism, borderline--no meds  Plan: Cytotech q 4 hours until cervix ripe CBGs q 4 hours/2 hour pp during ripening. Magnesium sulfate infusion Labetalol 400 mg po TID Glyburide 2.5 mg BID Hydralazine IV prn BP parameters SCDs  Repeat PIH labs at 12 noon--plan q 6 hours in early/active labor to prepare for possible  epidural. Reviewed plan of care with patient and mother, including possible need for serial induction, plan for close monitoring of maternal/fetal status, possible need for C/S if FTP, NRFHR, or deterioration of maternal status.  They seem to understand these issues and are willing to continue with induction process.  Donnel Saxon CNM 09/08/2014, 7:58 AM

## 2014-09-08 NOTE — Progress Notes (Signed)
  Subjective: Comfortable, no pain.  Mother, FOB, and FOB's mother at bedside. Patient had intake of several sugared beverages this afternoon.  Objective: BP 148/82 mmHg  Pulse 93  Temp(Src) 98 F (36.7 C) (Oral)  Resp 20  Ht 5\' 2"  (1.575 m)  Wt 308 lb (139.708 kg)  BMI 56.32 kg/m2 I/O last 3 completed shifts: In: 1030.8 [P.O.:395; I.V.:635.8] Out: 1450 [Urine:1450] Total I/O In: 7517 [P.O.:810; I.V.:875] Out: 1150 [Urine:1150]  Filed Vitals:   09/08/14 1425 09/08/14 1508 09/08/14 1718 09/08/14 1844  BP: 151/84 153/78 165/81 148/82  Pulse: 91 91 92 93  Temp: 98 F (36.7 C)     TempSrc: Oral     Resp: 20 20 20    Height:      Weight:         FHT: Category 1 overall, some segments with less variability, no decels UC:   None SVE:  Almost FT, 70%, vtx, -2. Unable to get full finger in cervix. 4th dose Cytotech placed in posterior fornix  CBG (last 3)   Recent Labs  09/08/14 0820 09/08/14 1313 09/08/14 1849  GLUCAP 96 146* 153*   High CBGs were related to intake.   Assessment:  Induction for chronic hypertension with superimposed pre-eclampsia Type 2 DM GBS positive  Plan: Continue current plan. Consider foley bulb prn, pitocin as appropriate.  Donnel Saxon CNM 09/08/2014, 6:54 PM

## 2014-09-08 NOTE — Progress Notes (Signed)
Unable to maintain consistent tracing of uc's d/t maternal body habitus and position.  Applied larger monitor straps in different configurations with little improvement.  Pt denies any discomfort.  Informed pt that POC was to obtain CBG at around 1700-1715.  Upon arrival, pt states she has just drunk an entire milkshake.  Reviewed CHO modified diet with patient, who had no response.  Family eating fast food in room.  Will give Glyburide as ordered and attempt to do CBG later.  Donnel Saxon, CNM called- left VM, will see pt for continued POC.

## 2014-09-09 ENCOUNTER — Inpatient Hospital Stay (HOSPITAL_COMMUNITY): Payer: Medicaid Other | Admitting: Anesthesiology

## 2014-09-09 ENCOUNTER — Encounter (HOSPITAL_COMMUNITY): Payer: Self-pay | Admitting: Anesthesiology

## 2014-09-09 ENCOUNTER — Encounter (HOSPITAL_COMMUNITY)
Admission: AD | Disposition: A | Discharge status: Home / Self Care | Payer: Self-pay | Source: Ambulatory Visit | Attending: Obstetrics and Gynecology

## 2014-09-09 LAB — CBC WITH DIFFERENTIAL/PLATELET
Basophils Absolute: 0 10*3/uL (ref 0.0–0.1)
Basophils Relative: 0 % (ref 0–1)
Eosinophils Absolute: 0 10*3/uL (ref 0.0–0.7)
Eosinophils Relative: 0 % (ref 0–5)
HCT: 31.2 % — ABNORMAL LOW (ref 36.0–46.0)
Hemoglobin: 10.6 g/dL — ABNORMAL LOW (ref 12.0–15.0)
Lymphocytes Relative: 16 % (ref 12–46)
Lymphs Abs: 1.8 10*3/uL (ref 0.7–4.0)
MCH: 28.7 pg (ref 26.0–34.0)
MCHC: 34 g/dL (ref 30.0–36.0)
MCV: 84.6 fL (ref 78.0–100.0)
Monocytes Absolute: 0.6 10*3/uL (ref 0.1–1.0)
Monocytes Relative: 6 % (ref 3–12)
Neutro Abs: 8.7 10*3/uL — ABNORMAL HIGH (ref 1.7–7.7)
Neutrophils Relative %: 78 % — ABNORMAL HIGH (ref 43–77)
Platelets: 273 10*3/uL (ref 150–400)
RBC: 3.69 MIL/uL — ABNORMAL LOW (ref 3.87–5.11)
RDW: 14.6 % (ref 11.5–15.5)
WBC: 11.2 10*3/uL — ABNORMAL HIGH (ref 4.0–10.5)

## 2014-09-09 LAB — GLUCOSE, CAPILLARY
Glucose-Capillary: 106 mg/dL — ABNORMAL HIGH (ref 70–99)
Glucose-Capillary: 109 mg/dL — ABNORMAL HIGH (ref 70–99)
Glucose-Capillary: 112 mg/dL — ABNORMAL HIGH (ref 70–99)
Glucose-Capillary: 111 mg/dL — ABNORMAL HIGH (ref 70–99)
Glucose-Capillary: 131 mg/dL — ABNORMAL HIGH (ref 70–99)

## 2014-09-09 LAB — CBC
HCT: 32.9 % — ABNORMAL LOW (ref 36.0–46.0)
Hemoglobin: 11.2 g/dL — ABNORMAL LOW (ref 12.0–15.0)
MCH: 28.9 pg (ref 26.0–34.0)
MCHC: 34 g/dL (ref 30.0–36.0)
MCV: 84.8 fL (ref 78.0–100.0)
Platelets: 292 10*3/uL (ref 150–400)
RBC: 3.88 MIL/uL (ref 3.87–5.11)
RDW: 14.9 % (ref 11.5–15.5)
WBC: 13.1 10*3/uL — ABNORMAL HIGH (ref 4.0–10.5)

## 2014-09-09 LAB — COMPREHENSIVE METABOLIC PANEL
ALT: 14 U/L (ref 0–35)
ALT: 15 U/L (ref 0–35)
AST: 18 U/L (ref 0–37)
AST: 19 U/L (ref 0–37)
Albumin: 2.2 g/dL — ABNORMAL LOW (ref 3.5–5.2)
Albumin: 2.3 g/dL — ABNORMAL LOW (ref 3.5–5.2)
Alkaline Phosphatase: 107 U/L (ref 39–117)
Alkaline Phosphatase: 110 U/L (ref 39–117)
Anion gap: 6 (ref 5–15)
Anion gap: 10 (ref 5–15)
BUN: 9 mg/dL (ref 6–23)
BUN: 9 mg/dL (ref 6–23)
CO2: 21 mmol/L (ref 19–32)
CO2: 17 mmol/L — ABNORMAL LOW (ref 19–32)
Calcium: 8.7 mg/dL (ref 8.4–10.5)
Calcium: 8.4 mg/dL (ref 8.4–10.5)
Chloride: 110 mEq/L (ref 96–112)
Chloride: 108 mEq/L (ref 96–112)
Creatinine, Ser: 0.63 mg/dL (ref 0.50–1.10)
Creatinine, Ser: 0.72 mg/dL (ref 0.50–1.10)
GFR calc Af Amer: >90 mL/min (ref >90)
GFR calc Af Amer: >90 mL/min (ref >90)
GFR calc non Af Amer: >90 mL/min (ref >90)
GFR calc non Af Amer: >90 mL/min (ref >90)
Glucose, Bld: 124 mg/dL — ABNORMAL HIGH (ref 70–99)
Glucose, Bld: 136 mg/dL — ABNORMAL HIGH (ref 70–99)
Potassium: 3.1 mmol/L — ABNORMAL LOW (ref 3.5–5.1)
Potassium: 3.3 mmol/L — ABNORMAL LOW (ref 3.5–5.1)
Sodium: 137 mmol/L (ref 135–145)
Sodium: 135 mmol/L (ref 135–145)
Total Bilirubin: 0.3 mg/dL (ref 0.3–1.2)
Total Bilirubin: 0.6 mg/dL (ref 0.3–1.2)
Total Protein: 5.5 g/dL — ABNORMAL LOW (ref 6.0–8.3)
Total Protein: 5.7 g/dL — ABNORMAL LOW (ref 6.0–8.3)

## 2014-09-09 LAB — MAGNESIUM: Magnesium: 4.7 mg/dL — ABNORMAL HIGH (ref 1.5–2.5)

## 2014-09-09 LAB — URIC ACID
Uric Acid, Serum: 5.5 mg/dL (ref 2.4–7.0)
Uric Acid, Serum: 5.8 mg/dL (ref 2.4–7.0)

## 2014-09-09 LAB — LACTATE DEHYDROGENASE
LDH: 173 U/L (ref 94–250)
LDH: 177 U/L (ref 94–250)

## 2014-09-09 SURGERY — Surgical Case
Anesthesia: Epidural | Site: Abdomen

## 2014-09-09 MED ORDER — PHENYLEPHRINE 8 MG IN D5W 100 ML (0.08MG/ML) PREMIX OPTIME: INJECTION | INTRAVENOUS | Status: DC | PRN

## 2014-09-09 MED ORDER — KETOROLAC TROMETHAMINE 30 MG/ML IJ SOLN: 30.0000 mg | Freq: Four times a day (QID) | INTRAMUSCULAR | Status: AC | PRN

## 2014-09-09 MED ORDER — TERBUTALINE SULFATE 1 MG/ML IJ SOLN
0.2500 mg | Freq: Once | INTRAMUSCULAR | Status: DC | PRN
  Filled 2014-09-09: qty 1

## 2014-09-09 MED ORDER — MORPHINE SULFATE (PF) 0.5 MG/ML IJ SOLN
INTRAMUSCULAR | Status: DC | PRN
  Administered 2014-09-09: 2500 ug via EPIDURAL

## 2014-09-09 MED ORDER — TERBUTALINE SULFATE 1 MG/ML IJ SOLN
0.2500 mg | Freq: Once | INTRAMUSCULAR | Status: AC
  Administered 2014-09-09: 0.25 mg via SUBCUTANEOUS

## 2014-09-09 MED ORDER — ONDANSETRON HCL 4 MG/2ML IJ SOLN
INTRAMUSCULAR | Status: DC | PRN
  Administered 2014-09-09: 4 mg via INTRAVENOUS

## 2014-09-09 MED ORDER — CEFAZOLIN SODIUM 10 G IJ SOLR
3.0000 g | INTRAMUSCULAR | Status: DC | PRN
  Administered 2014-09-09: 3 g via INTRAVENOUS

## 2014-09-09 MED ORDER — DIPHENHYDRAMINE HCL 50 MG/ML IJ SOLN: 12.5000 mg | INTRAMUSCULAR | Status: DC | PRN

## 2014-09-09 MED ORDER — PHENYLEPHRINE 8 MG IN D5W 100 ML (0.08MG/ML) PREMIX OPTIME
INJECTION | INTRAVENOUS | Status: AC
  Filled 2014-09-09: qty 100

## 2014-09-09 MED ORDER — MORPHINE SULFATE 0.5 MG/ML IJ SOLN
INTRAMUSCULAR | Status: AC
  Filled 2014-09-09: qty 10

## 2014-09-09 MED ORDER — FENTANYL 2.5 MCG/ML BUPIVACAINE 1/10 % EPIDURAL INFUSION (WH - ANES): 14.0000 mL/h | INTRAMUSCULAR | Status: DC | PRN

## 2014-09-09 MED ORDER — ONDANSETRON HCL 4 MG/2ML IJ SOLN
INTRAMUSCULAR | Status: AC
  Filled 2014-09-09: qty 2

## 2014-09-09 MED ORDER — EPHEDRINE 5 MG/ML INJ: 10.0000 mg | INTRAVENOUS | Status: DC | PRN

## 2014-09-09 MED ORDER — KETOROLAC TROMETHAMINE 30 MG/ML IJ SOLN: 30.0000 mg | Freq: Once | INTRAMUSCULAR | Status: AC | PRN

## 2014-09-09 MED ORDER — PHENYLEPHRINE 40 MCG/ML (10ML) SYRINGE FOR IV PUSH (FOR BLOOD PRESSURE SUPPORT): 80.0000 ug | PREFILLED_SYRINGE | INTRAVENOUS | Status: DC | PRN

## 2014-09-09 MED ORDER — SODIUM BICARBONATE 8.4 % IV SOLN
INTRAVENOUS | Status: AC
  Filled 2014-09-09: qty 50

## 2014-09-09 MED ORDER — OXYTOCIN 40 UNITS IN LACTATED RINGERS INFUSION - SIMPLE MED
1.0000 m[IU]/min | INTRAVENOUS | Status: DC
  Administered 2014-09-09: 1 m[IU]/min via INTRAVENOUS

## 2014-09-09 MED ORDER — PHENYLEPHRINE HCL 10 MG/ML IJ SOLN
20.0000 mg | INTRAVENOUS | Status: DC | PRN
  Administered 2014-09-09: 80 ug/min via INTRAVENOUS

## 2014-09-09 MED ORDER — LACTATED RINGERS IV SOLN: 500.0000 mL | Freq: Once | INTRAVENOUS | Status: DC

## 2014-09-09 MED ORDER — DEXTROSE 5 % IV SOLN
2.5000 10*6.[IU] | INTRAVENOUS | Status: DC
  Administered 2014-09-09 (×4): 2.5 10*6.[IU] via INTRAVENOUS
  Filled 2014-09-09 (×6): qty 2.5

## 2014-09-09 MED ORDER — DEXTROSE 5 % IV SOLN
INTRAVENOUS | Status: AC
  Filled 2014-09-09: qty 3000

## 2014-09-09 MED ORDER — OXYTOCIN 10 UNIT/ML IJ SOLN
40.0000 [IU] | INTRAVENOUS | Status: DC | PRN
  Administered 2014-09-09: 40 [IU] via INTRAVENOUS

## 2014-09-09 MED ORDER — SODIUM BICARBONATE 8.4 % IV SOLN
INTRAVENOUS | Status: DC | PRN
  Administered 2014-09-09: 4 mL via EPIDURAL
  Administered 2014-09-09 (×2): 5 mL via EPIDURAL

## 2014-09-09 MED ORDER — PENICILLIN G POTASSIUM 5000000 UNITS IJ SOLR
5.0000 10*6.[IU] | Freq: Once | INTRAVENOUS | Status: AC
  Administered 2014-09-09: 5 10*6.[IU] via INTRAVENOUS
  Filled 2014-09-09: qty 5

## 2014-09-09 MED ORDER — TERBUTALINE SULFATE 1 MG/ML IJ SOLN
INTRAMUSCULAR | Status: AC
  Administered 2014-09-09: 0.25 mg via SUBCUTANEOUS
  Filled 2014-09-09: qty 1

## 2014-09-09 MED ORDER — OXYTOCIN 10 UNIT/ML IJ SOLN
INTRAMUSCULAR | Status: AC
Start: 2014-09-09 — End: 2014-09-09
  Filled 2014-09-09: qty 4

## 2014-09-09 MED ORDER — LIDOCAINE-EPINEPHRINE (PF) 2 %-1:200000 IJ SOLN
INTRAMUSCULAR | Status: AC
  Filled 2014-09-09: qty 20

## 2014-09-09 MED ORDER — LACTATED RINGERS IV SOLN
INTRAVENOUS | Status: DC
  Administered 2014-09-09 (×2): via INTRAUTERINE

## 2014-09-09 SURGICAL SUPPLY — 40 items
APL SKNCLS STERI-STRIP NONHPOA (GAUZE/BANDAGES/DRESSINGS) ×1
BENZOIN TINCTURE PRP APPL 2/3 (GAUZE/BANDAGES/DRESSINGS) ×3 IMPLANT
CLAMP CORD UMBIL (MISCELLANEOUS) IMPLANT
CLOSURE WOUND 1/2 X4 (GAUZE/BANDAGES/DRESSINGS) ×1
CLOTH BEACON ORANGE TIMEOUT ST (SAFETY) ×3 IMPLANT
CONTAINER PREFILL 10% NBF 15ML (MISCELLANEOUS) IMPLANT
DRAIN JACKSON PRT FLT 7MM (DRAIN) ×2 IMPLANT
DRAPE SHEET LG 3/4 BI-LAMINATE (DRAPES) IMPLANT
DRSG OPSITE POSTOP 4X10 (GAUZE/BANDAGES/DRESSINGS) ×3 IMPLANT
DURAPREP 26ML APPLICATOR (WOUND CARE) ×3 IMPLANT
ELECT REM PT RETURN 9FT ADLT (ELECTROSURGICAL) ×3
ELECTRODE REM PT RTRN 9FT ADLT (ELECTROSURGICAL) ×1 IMPLANT
EXTRACTOR VACUUM M CUP 4 TUBE (SUCTIONS) IMPLANT
EXTRACTOR VACUUM M CUP 4' TUBE (SUCTIONS)
GAUZE SPONGE 4X4 12PLY STRL (GAUZE/BANDAGES/DRESSINGS) ×2 IMPLANT
GLOVE BIO SURGEON STRL SZ7.5 (GLOVE) ×3 IMPLANT
GLOVE BIOGEL PI IND STRL 7.5 (GLOVE) ×1 IMPLANT
GLOVE BIOGEL PI INDICATOR 7.5 (GLOVE) ×2
GOWN STRL REUS W/TWL LRG LVL3 (GOWN DISPOSABLE) ×6 IMPLANT
HOVERMATT SINGLE USE (MISCELLANEOUS) ×2 IMPLANT
KIT ABG SYR 3ML LUER SLIP (SYRINGE) IMPLANT
NDL HYPO 25X5/8 SAFETYGLIDE (NEEDLE) IMPLANT
NEEDLE HYPO 25X5/8 SAFETYGLIDE (NEEDLE) IMPLANT
NS IRRIG 1000ML POUR BTL (IV SOLUTION) ×3 IMPLANT
PACK C SECTION WH (CUSTOM PROCEDURE TRAY) ×3 IMPLANT
PAD ABD 7.5X8 STRL (GAUZE/BANDAGES/DRESSINGS) ×2 IMPLANT
PAD OB MATERNITY 4.3X12.25 (PERSONAL CARE ITEMS) ×3 IMPLANT
RETRACTOR WND ALEXIS 25 LRG (MISCELLANEOUS) ×1 IMPLANT
RTRCTR WOUND ALEXIS 25CM LRG (MISCELLANEOUS) ×3
SPONGE DRAIN TRACH 4X4 STRL 2S (GAUZE/BANDAGES/DRESSINGS) ×2 IMPLANT
STRIP CLOSURE SKIN 1/2X4 (GAUZE/BANDAGES/DRESSINGS) ×2 IMPLANT
SUT CHROMIC 2 0 CT 1 (SUTURE) ×3 IMPLANT
SUT MNCRL AB 3-0 PS2 27 (SUTURE) ×3 IMPLANT
SUT PLAIN 0 NONE (SUTURE) IMPLANT
SUT PLAIN 2 0 XLH (SUTURE) ×3 IMPLANT
SUT VIC AB 0 CT1 36 (SUTURE) ×3 IMPLANT
SUT VIC AB 0 CTX 36 (SUTURE) ×9
SUT VIC AB 0 CTX36XBRD ANBCTRL (SUTURE) ×3 IMPLANT
TOWEL OR 17X24 6PK STRL BLUE (TOWEL DISPOSABLE) ×3 IMPLANT
TRAY FOLEY CATH 14FR (SET/KITS/TRAYS/PACK) ×3 IMPLANT

## 2014-09-09 NOTE — MAU Provider Note (Deleted)
Labor Progress LE Subjective: No complaints.  Pt moving around in the bed  Objective: BP 169/75 mmHg  Pulse 99  Temp(Src) 97.7 F (36.5 C) (Oral)  Resp 20  Ht 5\' 2"  (1.575 m)  Wt 308 lb (139.708 kg)  BMI 56.32 kg/m2 I/O last 3 completed shifts: In: 3460.8 [P.O.:1325; I.V.:2135.8] Out: 3650 [Urine:3650] Total I/O In: 4580 [P.O.:860; I.V.:875] Out: 1450 [Urine:1450] FHT: 130, moderate variability, + accel CTX:  none Uterus gravid, soft non tender SVE:  Dilation: Fingertip Effacement (%): 40 Station: Ballotable Exam by:: V. Hayde Kilgour, CNM  Filed Vitals:   09/09/14 0002 09/09/14 0029 09/09/14 0119 09/09/14 0202  BP: 205/95 154/66 158/67 169/75  Pulse: 94 102 97 99  Temp:      TempSrc:      Resp: 20 20 20 20   Height:      Weight:        Assessment:  IUP at 37.1 weeks NICHD: Category 2 Membranes:  intact Labor progress: IOL GBS: positive cytotec #  5   Plan: Continue labor plan Continuous monitoring Frequent position changes to facilitate fetal rotation and descent. Will reassess with cervical exam at 0400 or earlier if necessary      Therron Sells, CNM, MSN 09/09/2014. 3:04 AM

## 2014-09-09 NOTE — Transfer of Care (Signed)
Immediate Anesthesia Transfer of Care Note  Patient: Lauren Delgado  Procedure(s) Performed: Procedure(s): CESAREAN SECTION (N/A)  Patient Location: PACU  Anesthesia Type:Epidural  Level of Consciousness: awake, oriented and patient cooperative  Airway & Oxygen Therapy: Patient Spontanous Breathing  Post-op Assessment: Report given to PACU RN and Post -op Vital signs reviewed and stable  Post vital signs: Reviewed and stable  Complications: No apparent anesthesia complications

## 2014-09-09 NOTE — Anesthesia Procedure Notes (Signed)
Epidural Patient location during procedure: OR Start time: 09/09/2014 10:36 PM  Staffing Anesthesiologist: Rudean Curt Performed by: anesthesiologist   Preanesthetic Checklist Completed: patient identified, site marked, surgical consent, pre-op evaluation, timeout performed, IV checked, risks and benefits discussed and monitors and equipment checked  Epidural Patient position: sitting Prep: site prepped and draped and DuraPrep Patient monitoring: continuous pulse ox and blood pressure Approach: midline Injection technique: LOR air  Needle:  Needle type: Tuohy  Needle gauge: 17 G Needle length: 9 cm and 9 Needle insertion depth: 8 cm Catheter type: closed end flexible Catheter size: 19 Gauge Catheter at skin depth: 15 cm Test dose: negative  Assessment Events: blood not aspirated, injection not painful, no injection resistance, negative IV test and no paresthesia  Additional Notes Patient identified.  Risk benefits discussed including failed block, incomplete pain control, headache, nerve damage, paralysis, blood pressure changes, nausea, vomiting, reactions to medication both toxic or allergic, and postpartum back pain.  Patient expressed understanding and wished to proceed.  All questions were answered.  Sterile technique used throughout procedure and epidural site dressed with sterile barrier dressing. No paresthesia or other complications noted.The patient did not experience any signs of intravascular injection such as tinnitus or metallic taste in mouth nor signs of intrathecal spread such as rapid motor block. Please see nursing notes for vital signs.

## 2014-09-09 NOTE — Progress Notes (Addendum)
Lauren Delgado is a 29 y.o. G1P0 at [redacted]w[redacted]d   Subjective: No complaints.  Denies HA, visual changes or abdominal pain.  Objective: BP 160/79 mmHg  Pulse 98  Temp(Src) 97.8 F (36.6 C) (Oral)  Resp 20  Ht 5\' 2"  (1.575 m)  Wt 139.708 kg (308 lb)  BMI 56.32 kg/m2  SpO2 98% I/O last 3 completed shifts: In: 6387.5 [P.O.:2185; I.V.:3952.5; IV Piggyback:250] Out: 4696 [EXBMW:4132] Total I/O In: 843.2 [P.O.:240; I.V.:503.2; IV Piggyback:100] Out: 775 [Urine:775]  FHT:  FHR: 120s bpm, variability: moderate,  accelerations:  Abscent,  decelerations:  Absent UC:   regular, every few minutes per pt feels them as cramping (unable to palpate or trace) SVE:   Dilation: 1 Effacement (%):  (thinner) Station: Ballotable Exam by:: dr Mancel Bale   Foley in place and taped to leg, unable to come out but seemed to be progressing some on last exam when cvx was thinner and slightly more dilated with balloon bulging at ext os.  Labs: Lab Results  Component Value Date   WBC 11.2* 09/09/2014   HGB 10.6* 09/09/2014   HCT 31.2* 09/09/2014   MCV 84.6 09/09/2014   PLT 273 09/09/2014    Assessment / Plan: Induction of labor due to preeclampsia,  progressing well on pitocin  Labor: Contracting regularly s/p cytotec with hyperstimulation when started on pitocin 15mu/min.   Preeclampsia:  on magnesium sulfate, BP under fair control, good UOP Fetal Wellbeing:  Category I Pain Control:  meds prn I/D:  GBS + on PCN Anticipated MOD:  continuing to observe DM under control  Trenee Igoe Y 09/09/2014, 1:56 PM

## 2014-09-09 NOTE — Progress Notes (Signed)
Lauren Delgado is a 29 y.o. G1P0 at [redacted]w[redacted]d   Subjective: No complaints  Objective: BP 167/93 mmHg  Pulse 96  Temp(Src) 98.2 F (36.8 C) (Oral)  Resp 18  Ht 5\' 2"  (1.575 m)  Wt 139.708 kg (308 lb)  BMI 56.32 kg/m2 I/O last 3 completed shifts: In: 6387.5 [P.O.:2185; I.V.:3952.5; IV Piggyback:250] Out: 7989 [Urine:5725] Total I/O In: 717.9 [P.O.:240; I.V.:377.9; IV Piggyback:100] Out: 775 [Urine:775]  FHT:  FHR: 120s bpm, variability: minimal to moderate,  accelerations:  Abscent,  decelerations:  Absent UC:   Hard to pick up secondary to body habitus SVE:   Dilation: 1 Effacement (%):  (thinner) Station: Ballotable Exam by:: dr Mancel Bale  Labs: Lab Results  Component Value Date   WBC 11.2* 09/09/2014   HGB 10.6* 09/09/2014   HCT 31.2* 09/09/2014   MCV 84.6 09/09/2014   PLT 273 09/09/2014    Assessment / Plan: Induction secondary to CHTN with SI Preeclampsia  Labor: foley balloon in place.  Pt likely hyperstimulated when started on pitocin even at 75mu/min.  Tracing currently reassuring and pt reporting ctxs every few minutes.  Foley balloon continues to be in place. Preeclampsia:  on magnesium sulfate Fetal Wellbeing:  Category I and episodes of cat 2 Pain Control:  IV pain medicine prn I/D:  GBS + on PCN Anticipated MOD:  remote from delivery will cont to observe  DM controlled  Deovion Batrez Y 09/09/2014, 11:19 AM

## 2014-09-09 NOTE — Anesthesia Preprocedure Evaluation (Signed)
Anesthesia Evaluation  Patient identified by MRN, date of birth, ID band Patient awake    Reviewed: Allergy & Precautions, H&P , Patient's Chart, lab work & pertinent test results  Airway Mallampati: IV  TM Distance: >3 FB Neck ROM: full    Dental   Pulmonary  breath sounds clear to auscultation        Cardiovascular hypertension, Rhythm:regular Rate:Normal     Neuro/Psych    GI/Hepatic   Endo/Other  diabetesHypothyroidism   Renal/GU      Musculoskeletal   Abdominal   Peds  Hematology   Anesthesia Other Findings   Reproductive/Obstetrics (+) Pregnancy                             Anesthesia Physical Anesthesia Plan  ASA: III and emergent  Anesthesia Plan: Epidural   Post-op Pain Management:    Induction:   Airway Management Planned:   Additional Equipment:   Intra-op Plan:   Post-operative Plan:   Informed Consent: I have reviewed the patients History and Physical, chart, labs and discussed the procedure including the risks, benefits and alternatives for the proposed anesthesia with the patient or authorized representative who has indicated his/her understanding and acceptance.     Plan Discussed with:   Anesthesia Plan Comments:         Anesthesia Quick Evaluation

## 2014-09-09 NOTE — Progress Notes (Signed)
Labor Progress LE Subjective: No complaints. Pt moving around in the bed  Objective: BP 169/75 mmHg  Pulse 99  Temp(Src) 97.7 F (36.5 C) (Oral)  Resp 20  Ht 5\' 2"  (1.575 m)  Wt 308 lb (139.708 kg)  BMI 56.32 kg/m2 I/O last 3 completed shifts: In: 3460.8 [P.O.:1325; I.V.:2135.8] Out: 3650 [Urine:3650] Total I/O In: 9432 [P.O.:860; I.V.:875] Out: 1450 [Urine:1450] FHT: 130, moderate variability, + accel CTX: none Uterus gravid, soft non tender SVE: Dilation: Fingertip Effacement (%): 40 Station: Ballotable Exam by:: V. Odaliz Mcqueary, CNM  Filed Vitals:   09/09/14 0002 09/09/14 0029 09/09/14 0119 09/09/14 0202  BP: 205/95 154/66 158/67 169/75  Pulse: 94 102 97 99  Temp:      TempSrc:      Resp: 20 20 20 20   Height:      Weight:        Assessment:  IUP at 37.1 weeks NICHD: Category 2 Membranes: intact Labor progress: IOL GBS: positive cytotec # 5   Plan: Continue labor plan Continuous monitoring Frequent position changes to facilitate fetal rotation and descent. Will reassess with cervical exam at 0400 or earlier if necessary      Kathaleen Dudziak, CNM, MSN 09/09/2014. 3:04 AM

## 2014-09-09 NOTE — Progress Notes (Signed)
Lauren Delgado is a 29 y.o. G1P0 at [redacted]w[redacted]d   Subjective: Pt is getting tired per RN report.  Objective: BP 163/79 mmHg  Pulse 98  Temp(Src) 97.8 F (36.6 C) (Oral)  Resp 20  Ht 5\' 2"  (1.575 m)  Wt 139.708 kg (308 lb)  BMI 56.32 kg/m2  SpO2 98% I/O last 3 completed shifts: In: 6387.5 [P.O.:2185; I.V.:3952.5; IV Piggyback:250] Out: 5725 [Urine:5725] Total I/O In: 2033.6 [P.O.:480; I.V.:1253.6; IV Piggyback:300] Out: 1175 [Urine:1175]  FHT:  FHR: 120s bpm, variability: minimal to moderate variability,  accelerations:  Abscent,  decelerations:  Absent UC:   regular, every 3-4 minutes SVE:   Dilation: 3.5 Effacement (%): 60 Station: -2 Exam by:: Dr. Mancel Bale Foley balloon removed, AROM, clear fluid, IUPC placed, FSE placed Labs: Lab Results  Component Value Date   WBC 11.2* 09/09/2014   HGB 10.6* 09/09/2014   HCT 31.2* 09/09/2014   MCV 84.6 09/09/2014   PLT 273 09/09/2014    Assessment / Plan: Spontaneous labor, progressing normally  Labor: Progressing normally Preeclampsia:  on magnesium sulfate.  Will check another set of labs.  UOP 400cc/4hrs last void at 2:30p. Fetal Wellbeing:  Category I and Category II I think the magnesium sulfate contributes to the decrease in variability. Pain Control:  declining pain med but will ask if she needs it per her mother I/D:  GBS+ on PCN Anticipated MOD:  NSVD but pt is ok with a c/s if needed but currently ok with continuing and I have discussed with her the situation and answered her questions as much as possible. DM CBG 112 at 5p  Jeanine Caven Y 09/09/2014, 5:55 PM

## 2014-09-09 NOTE — Progress Notes (Addendum)
Labor Progress  Subjective: "tired".  Pt i nformed that the baby is doing better and the CS is on hold for now  Objective: BP 161/72 mmHg  Pulse 97  Temp(Src) 98.2 F (36.8 C) (Oral)  Resp 20  Ht 5\' 2"  (1.575 m)  Wt 308 lb (139.708 kg)  BMI 56.32 kg/m2 I/O last 3 completed shifts: In: 3460.8 [P.O.:1325; I.V.:2135.8] Out: 9678 [Urine:3650] Total I/O In: 2301.7 [P.O.:860; I.V.:1441.7] Out: 1775 [LFYBO:1751] FHT: 130, min-mod variability, no decel, no accel CTX:  none Uterus gravid, soft non tender SVE:  Dilation: Fingertip Effacement (%): 40 Station: Ballotable Exam by:: V.Aquilla Voiles, CNM   Assessment:  IUP at 37.1 weeks IOL for [facility] NICHD: Category 2 Membranes:  intact Labor progress: IOL GBS: positive A1GDM: stable sugars 106 Consulted with Dr Alesia Richards, CS on hold   Plan: Continue labor plan Continuous  monitoring Rest Frequent position changes to facilitate fetal rotation and descent. Will reassess with cervical exam at 0630 or earlier if necessary Start pitocin 1x1    Aiyla Baucom, CNM, MSN 09/09/2014. 5:34 AM

## 2014-09-09 NOTE — Op Note (Signed)
Cesarean Section Procedure Note  Indications: P0 at 35 1/7wks  Pre-operative Diagnosis: 1.Fetal Intolerance to Labor 2.FTP/Failed Induction   Post-operative Diagnosis: 1.Fetal Intolerance to Labor 2.FTP/Failed Induction  Procedure: CESAREAN SECTION  Surgeon: Delice Lesch, MD    Assistants: Sallee Provencal, CNM  Anesthesia: Epidural  Anesthesiologist: Rudean Curt, MD   Procedure Details  The patient was taken to the operating room secondary to fetal intolerance of labor after the risks, benefits, complications, treatment options, and expected outcomes were discussed with the patient.  The patient concurred with the proposed plan, giving informed consent which was signed and witnessed. The patient was taken to Operating Room C-Section, identified as [name] and the procedure verified as C-Section Delivery. A Time Out was held and the above information confirmed.  After induction of anesthesia by obtaining a spinal, the patient was prepped and draped in the usual sterile manner. A Pfannenstiel skin incision was made and carried down through the subcutaneous tissue to the underlying layer of fascia.  The fascia was incised bilaterally and extended transversely bilaterally with the Mayo scissors. Kocher clamps were placed on the inferior aspect of the fascial incision and the underlying rectus muscle was separated from the fascia. The same was done on the superior aspect of the fascial incision.  The peritoneum was identified, entered bluntly and extended manually.  An Alexis self-retaining retractor was placed.  The utero-vesical peritoneal reflection was incised transversely and the bladder flap was bluntly freed from the lower uterine segment. A low transverse uterine incision was made with the scalpel and extended bilaterally with the bandage scissors.  The infant was delivered in vertex position without difficulty.  After the umbilical cord was clamped and cut, the infant was  handed to the awaiting pediatricians.  Cord blood was obtained for evaluation.  The placenta was removed intact and appeared to be within normal limits. The uterus was cleared of all clots and debris. The uterine incision was closed with running interlocking sutures of 0 Vicryl and a second imbricating layer was performed as well.   Bilateral tubes and ovaries appeared to be within normal limits.  Good hemostasis was noted.  Copious irrigation was performed until clear.  The peritoneum was repaired with 2-0 chromic via a running suture.  The fascia was reapproximated with a running suture of 0 Vicryl. The subcutaneous tissue was reapproximated with 3 interrupted sutures of 2-0 plain.  The skin was reapproximated with a subcuticular suture of 3-0 monocryl.  Steristrips were applied.  Instrument, sponge, and needle counts were correct prior to abdominal closure and at the conclusion of the case.  The patient was awaiting transfer to the recovery room in good condition.  Findings: Live female infant with Apgars 2 at one minute and 8 at five minutes.  Normal appearing bilateral ovaries and fallopian tubes were noted.  Estimated Blood Loss:  600 ml          Drains: foley to gravity 300 cc         Total IV Fluids: 1500 ml         Specimens to Pathology: Placenta         Complications:  None; patient tolerated the procedure well.         Disposition: PACU - hemodynamically stable.         Condition: stable  Attending Attestation: I performed the procedure.

## 2014-09-09 NOTE — Anesthesia Postprocedure Evaluation (Signed)
Anesthesia Post Note  Patient: Lauren Delgado  Procedure(s) Performed: Procedure(s) (LRB): CESAREAN SECTION (N/A)  Anesthesia type: Epidural  Patient location: PACU  Post pain: Pain level controlled  Post assessment: Post-op Vital signs reviewed  Last Vitals:  Filed Vitals:   09/09/14 2042  BP: 185/80  Pulse: 95  Temp:   Resp: 20    Post vital signs: Reviewed  Level of consciousness: awake  Complications: No apparent anesthesia complications... Will keep epidural in place until the morning in case of unexpected need to return to the OR

## 2014-09-09 NOTE — Progress Notes (Signed)
Lauren Delgado is a 29 [name]. G1P0 at [redacted]w[redacted]d  Subjective: No complaints  Objective: BP 175/84 mmHg  Pulse 90  Temp(Src) 98.2 F (36.8 C) (Oral)  Resp 18  Ht 5\' 2"  (1.575 m)  Wt 139.708 kg (308 lb)  BMI 56.32 kg/m2 I/O last 3 completed shifts: In: 6387.5 [P.O.:2185; I.V.:3952.5; IV Piggyback:250] Out: 2353 [Urine:5725] Total I/O In: 95 [P.O.:240; I.V.:250; IV Piggyback:100] Out: 675 [Urine:675]  FHT:  FHR: 120s bpm, variability: moderate,  accelerations:  Abscent,  decelerations:  Absent UC:   Hard to trace SVE:   Dilation: 1 Effacement (%): Thick Station: Ballotable Exam by:: Dr. Mancel Bale  Labs: Lab Results  Component Value Date   WBC 11.2* 09/09/2014   HGB 10.6* 09/09/2014   HCT 31.2* 09/09/2014   MCV 84.6 09/09/2014   PLT 273 09/09/2014    Assessment / Plan: Induction of labor due to preeclampsia,  progressing well on pitocin  Labor: Progressing on Pitocin, will continue to increase then AROM. Spec Exam performed to place foley which was placed without difficulty at about 9am. Preeclampsia:  on magnesium sulfate Fetal Wellbeing:  Category I Pain Control:  none currently I/D:  GBS+ on PCN Anticipated MOD:  continue to observe  Lauren Delgado 09/09/2014, 10:05 AM   While in doing another delivery the RN reported late decelerations on pitocin of 40mu and I asked for pitocin to be discontinued.  Pt to be assessed.

## 2014-09-09 NOTE — Progress Notes (Signed)
Labor Progress  Subjective: Sleeping.  Pt informed the baby is not tolerating labor and we may need to delivery via CS.  Pt understand and in agreement  Objective: BP 169/75 mmHg  Pulse 99  Temp(Src) 97.7 F (36.5 C) (Oral)  Resp 20  Ht 5\' 2"  (1.575 m)  Wt 308 lb (139.708 kg)  BMI 56.32 kg/m2 I/O last 3 completed shifts: In: 3460.8 [P.O.:1325; I.V.:2135.8] Out: 3650 [Urine:3650] Total I/O In: 5374 [P.O.:860; I.V.:875] Out: 1450 [Urine:1450] FHT: 120, minimum variability, no accel, reoccurring variable decels CTX:  Unable to pick up or palpate ctx Uterus gravid, soft non tender SVE:  Dilation: Fingertip Effacement (%): 40 Station: Ballotable Exam by:: V. Chelise Hanger, CNM   Assessment:  IUP at 37.1 weeks NICHD: Category 2 Membranes:  intact Labor progress: IOL GBS: positive  Plan: Continue labor plan Continuousmonitoring Consult with Dr Arnaldo Natal Kimothy Kishimoto, CNM, MSN 09/09/2014. 3:11 AM

## 2014-09-10 ENCOUNTER — Inpatient Hospital Stay (HOSPITAL_COMMUNITY): Admission: RE | Admit: 2014-09-10 | Payer: Medicaid Other | Source: Ambulatory Visit

## 2014-09-10 ENCOUNTER — Encounter (HOSPITAL_COMMUNITY): Payer: Self-pay

## 2014-09-10 LAB — GLUCOSE, CAPILLARY
Glucose-Capillary: 110 mg/dL — ABNORMAL HIGH (ref 70–99)
Glucose-Capillary: 115 mg/dL — ABNORMAL HIGH (ref 70–99)
Glucose-Capillary: 127 mg/dL — ABNORMAL HIGH (ref 70–99)
Glucose-Capillary: 140 mg/dL — ABNORMAL HIGH (ref 70–99)
Glucose-Capillary: 157 mg/dL — ABNORMAL HIGH (ref 70–99)
Glucose-Capillary: 168 mg/dL — ABNORMAL HIGH (ref 70–99)
Glucose-Capillary: 195 mg/dL — ABNORMAL HIGH (ref 70–99)

## 2014-09-10 LAB — COMPREHENSIVE METABOLIC PANEL
ALT: 12 U/L (ref 0–35)
ALT: 11 U/L (ref 0–35)
AST: 18 U/L (ref 0–37)
AST: 15 U/L (ref 0–37)
Albumin: 2 g/dL — ABNORMAL LOW (ref 3.5–5.2)
Albumin: 2.2 g/dL — ABNORMAL LOW (ref 3.5–5.2)
Alkaline Phosphatase: 94 U/L (ref 39–117)
Alkaline Phosphatase: 97 U/L (ref 39–117)
Anion gap: 12 (ref 5–15)
Anion gap: 7 (ref 5–15)
BUN: 11 mg/dL (ref 6–23)
BUN: 15 mg/dL (ref 6–23)
CO2: 17 mmol/L — ABNORMAL LOW (ref 19–32)
CO2: 21 mmol/L (ref 19–32)
Calcium: 8.1 mg/dL — ABNORMAL LOW (ref 8.4–10.5)
Calcium: 7.9 mg/dL — ABNORMAL LOW (ref 8.4–10.5)
Chloride: 106 mEq/L (ref 96–112)
Chloride: 105 mEq/L (ref 96–112)
Creatinine, Ser: 0.83 mg/dL (ref 0.50–1.10)
Creatinine, Ser: 1.16 mg/dL — ABNORMAL HIGH (ref 0.50–1.10)
GFR calc Af Amer: >90 mL/min (ref >90)
GFR calc Af Amer: 74 mL/min — ABNORMAL LOW (ref >90)
GFR calc non Af Amer: >90 mL/min (ref >90)
GFR calc non Af Amer: 64 mL/min — ABNORMAL LOW (ref >90)
Glucose, Bld: 197 mg/dL — ABNORMAL HIGH (ref 70–99)
Glucose, Bld: 116 mg/dL — ABNORMAL HIGH (ref 70–99)
Potassium: 3.7 mmol/L (ref 3.5–5.1)
Potassium: 3.4 mmol/L — ABNORMAL LOW (ref 3.5–5.1)
Sodium: 135 mmol/L (ref 135–145)
Sodium: 133 mmol/L — ABNORMAL LOW (ref 135–145)
Total Bilirubin: 0.4 mg/dL (ref 0.3–1.2)
Total Bilirubin: 0.1 mg/dL — ABNORMAL LOW (ref 0.3–1.2)
Total Protein: 5.1 g/dL — ABNORMAL LOW (ref 6.0–8.3)
Total Protein: 5.8 g/dL — ABNORMAL LOW (ref 6.0–8.3)

## 2014-09-10 LAB — CBC
HCT: 31.9 % — ABNORMAL LOW (ref 36.0–46.0)
Hemoglobin: 10.6 g/dL — ABNORMAL LOW (ref 12.0–15.0)
MCH: 29 pg (ref 26.0–34.0)
MCHC: 33.2 g/dL (ref 30.0–36.0)
MCV: 87.4 fL (ref 78.0–100.0)
Platelets: 302 10*3/uL (ref 150–400)
RBC: 3.65 MIL/uL — ABNORMAL LOW (ref 3.87–5.11)
RDW: 15 % (ref 11.5–15.5)
WBC: 15.7 10*3/uL — ABNORMAL HIGH (ref 4.0–10.5)

## 2014-09-10 LAB — MAGNESIUM
Magnesium: 5.5 mg/dL — ABNORMAL HIGH (ref 1.5–2.5)
Magnesium: 6.8 mg/dL (ref 1.5–2.5)
Magnesium: 6.5 mg/dL (ref 1.5–2.5)

## 2014-09-10 LAB — URIC ACID: Uric Acid, Serum: 6.2 mg/dL (ref 2.4–7.0)

## 2014-09-10 LAB — LACTATE DEHYDROGENASE: LDH: 194 U/L (ref 94–250)

## 2014-09-10 MED ORDER — NALBUPHINE HCL 10 MG/ML IJ SOLN
INTRAMUSCULAR | Status: AC
  Filled 2014-09-10: qty 1

## 2014-09-10 MED ORDER — DIPHENHYDRAMINE HCL 50 MG/ML IJ SOLN: 12.5000 mg | INTRAMUSCULAR | Status: DC | PRN

## 2014-09-10 MED ORDER — SIMETHICONE 80 MG PO CHEW
80.0000 mg | CHEWABLE_TABLET | ORAL | Status: DC
  Administered 2014-09-10 – 2014-09-11 (×2): 80 mg via ORAL
  Filled 2014-09-10 (×2): qty 1

## 2014-09-10 MED ORDER — TETANUS-DIPHTH-ACELL PERTUSSIS 5-2.5-18.5 LF-MCG/0.5 IM SUSP
0.5000 mL | Freq: Once | INTRAMUSCULAR | Status: DC
  Filled 2014-09-10: qty 0.5

## 2014-09-10 MED ORDER — MENTHOL 3 MG MT LOZG
1.0000 | LOZENGE | OROMUCOSAL | Status: DC | PRN
Start: 2014-09-10 — End: 2014-09-15

## 2014-09-10 MED ORDER — ONDANSETRON HCL 4 MG PO TABS: 4.0000 mg | ORAL_TABLET | ORAL | Status: DC | PRN

## 2014-09-10 MED ORDER — FENTANYL CITRATE 0.05 MG/ML IJ SOLN
INTRAMUSCULAR | Status: AC
  Filled 2014-09-10: qty 2

## 2014-09-10 MED ORDER — NALBUPHINE HCL 10 MG/ML IJ SOLN: 5.0000 mg | INTRAMUSCULAR | Status: DC | PRN

## 2014-09-10 MED ORDER — ACETAMINOPHEN 325 MG PO TABS: 325.0000 mg | ORAL_TABLET | ORAL | Status: DC | PRN

## 2014-09-10 MED ORDER — IBUPROFEN 600 MG PO TABS: 600.0000 mg | ORAL_TABLET | Freq: Four times a day (QID) | ORAL | Status: DC | PRN

## 2014-09-10 MED ORDER — DIPHENHYDRAMINE HCL 25 MG PO CAPS: 25.0000 mg | ORAL_CAPSULE | ORAL | Status: DC | PRN

## 2014-09-10 MED ORDER — IBUPROFEN 600 MG PO TABS
600.0000 mg | ORAL_TABLET | Freq: Four times a day (QID) | ORAL | Status: DC
Start: 2014-09-10 — End: 2014-09-11
  Administered 2014-09-10 – 2014-09-11 (×5): 600 mg via ORAL
  Filled 2014-09-10 (×5): qty 1

## 2014-09-10 MED ORDER — LABETALOL HCL 200 MG PO TABS
300.0000 mg | ORAL_TABLET | Freq: Three times a day (TID) | ORAL | Status: DC
  Administered 2014-09-10 – 2014-09-11 (×3): 300 mg via ORAL
  Filled 2014-09-10 (×5): qty 1

## 2014-09-10 MED ORDER — INSULIN ASPART 100 UNIT/ML ~~LOC~~ SOLN
2.0000 [IU] | Freq: Three times a day (TID) | SUBCUTANEOUS | Status: DC
  Administered 2014-09-10: 2 [IU] via SUBCUTANEOUS

## 2014-09-10 MED ORDER — SCOPOLAMINE 1 MG/3DAYS TD PT72
MEDICATED_PATCH | TRANSDERMAL | Status: AC
  Filled 2014-09-10: qty 1

## 2014-09-10 MED ORDER — MIDAZOLAM HCL 2 MG/2ML IJ SOLN
0.5000 mg | Freq: Once | INTRAMUSCULAR | Status: DC | PRN
Start: 2014-09-10 — End: 2014-09-10

## 2014-09-10 MED ORDER — SENNOSIDES-DOCUSATE SODIUM 8.6-50 MG PO TABS
2.0000 | ORAL_TABLET | ORAL | Status: DC
  Administered 2014-09-10 – 2014-09-11 (×2): 2 via ORAL
  Filled 2014-09-10 (×5): qty 2

## 2014-09-10 MED ORDER — FENTANYL CITRATE 0.05 MG/ML IJ SOLN
25.0000 ug | INTRAMUSCULAR | Status: DC | PRN
  Administered 2014-09-10: 50 ug via INTRAVENOUS

## 2014-09-10 MED ORDER — HYDRALAZINE HCL 20 MG/ML IJ SOLN
INTRAMUSCULAR | Status: AC
  Administered 2014-09-11: 10 mg via INTRAVENOUS
  Filled 2014-09-10: qty 1

## 2014-09-10 MED ORDER — HYDRALAZINE HCL 20 MG/ML IJ SOLN
10.0000 mg | INTRAMUSCULAR | Status: DC | PRN
  Administered 2014-09-11 (×2): 10 mg via INTRAVENOUS
  Administered 2014-09-12: 20 mg via INTRAVENOUS
  Administered 2014-09-12 (×2): 10 mg via INTRAVENOUS
  Administered 2014-09-12: 20 mg via INTRAVENOUS
  Administered 2014-09-13 (×2): 10 mg via INTRAVENOUS
  Administered 2014-09-14: 20 mg via INTRAVENOUS
  Administered 2014-09-14: 10 mg via INTRAVENOUS
  Administered 2014-09-14: 20 mg via INTRAVENOUS
  Administered 2014-09-14: 10 mg via INTRAVENOUS
  Filled 2014-09-10 (×12): qty 1

## 2014-09-10 MED ORDER — NALOXONE HCL 0.4 MG/ML IJ SOLN: 0.4000 mg | INTRAMUSCULAR | Status: DC | PRN

## 2014-09-10 MED ORDER — 0.9 % SODIUM CHLORIDE (POUR BTL) OPTIME
TOPICAL | Status: DC | PRN
  Administered 2014-09-09: 1000 mL

## 2014-09-10 MED ORDER — LABETALOL HCL 5 MG/ML IV SOLN
INTRAVENOUS | Status: AC
  Filled 2014-09-10: qty 4

## 2014-09-10 MED ORDER — SCOPOLAMINE 1 MG/3DAYS TD PT72
1.0000 | MEDICATED_PATCH | Freq: Once | TRANSDERMAL | Status: DC
  Administered 2014-09-10: 1.5 mg via TRANSDERMAL

## 2014-09-10 MED ORDER — MEPERIDINE HCL 25 MG/ML IJ SOLN: 6.2500 mg | INTRAMUSCULAR | Status: DC | PRN

## 2014-09-10 MED ORDER — PRENATAL MULTIVITAMIN CH
1.0000 | ORAL_TABLET | Freq: Every day | ORAL | Status: DC
  Administered 2014-09-10 – 2014-09-13 (×4): 1 via ORAL
  Filled 2014-09-10 (×5): qty 1

## 2014-09-10 MED ORDER — LACTATED RINGERS IV SOLN
INTRAVENOUS | Status: DC
  Administered 2014-09-10 – 2014-09-11 (×2): via INTRAVENOUS

## 2014-09-10 MED ORDER — MEASLES, MUMPS & RUBELLA VAC ~~LOC~~ INJ
0.5000 mL | INJECTION | Freq: Once | SUBCUTANEOUS | Status: DC
Start: 2014-09-10 — End: 2014-09-11
  Filled 2014-09-10: qty 0.5

## 2014-09-10 MED ORDER — DEXTROSE 5 % IV SOLN
1.0000 ug/kg/h | INTRAVENOUS | Status: DC | PRN
  Filled 2014-09-10: qty 2

## 2014-09-10 MED ORDER — LACTATED RINGERS IV BOLUS (SEPSIS)
300.0000 mL | Freq: Once | INTRAVENOUS | Status: AC
  Administered 2014-09-10: 300 mL via INTRAVENOUS

## 2014-09-10 MED ORDER — ONDANSETRON HCL 4 MG/2ML IJ SOLN: 4.0000 mg | Freq: Three times a day (TID) | INTRAMUSCULAR | Status: DC | PRN

## 2014-09-10 MED ORDER — SODIUM CHLORIDE 0.9 % IJ SOLN
3.0000 mL | INTRAMUSCULAR | Status: DC | PRN
  Administered 2014-09-11 – 2014-09-12 (×3): 3 mL via INTRAVENOUS
  Filled 2014-09-10 (×3): qty 3

## 2014-09-10 MED ORDER — GLYBURIDE 2.5 MG PO TABS
2.5000 mg | ORAL_TABLET | Freq: Two times a day (BID) | ORAL | Status: DC
  Administered 2014-09-10 – 2014-09-11 (×3): 2.5 mg via ORAL
  Filled 2014-09-10 (×3): qty 1

## 2014-09-10 MED ORDER — ACETAMINOPHEN 500 MG PO TABS
1000.0000 mg | ORAL_TABLET | Freq: Four times a day (QID) | ORAL | Status: AC
  Administered 2014-09-10 (×4): 1000 mg via ORAL
  Filled 2014-09-10 (×5): qty 2

## 2014-09-10 MED ORDER — NALBUPHINE HCL 10 MG/ML IJ SOLN
5.0000 mg | Freq: Once | INTRAMUSCULAR | Status: AC | PRN
  Administered 2014-09-10: 5 mg via SUBCUTANEOUS

## 2014-09-10 MED ORDER — ZOLPIDEM TARTRATE 5 MG PO TABS: 5.0000 mg | ORAL_TABLET | Freq: Every evening | ORAL | Status: DC | PRN

## 2014-09-10 MED ORDER — MEPERIDINE HCL 25 MG/ML IJ SOLN
6.2500 mg | INTRAMUSCULAR | Status: DC | PRN
Start: 2014-09-10 — End: 2014-09-10

## 2014-09-10 MED ORDER — DIPHENHYDRAMINE HCL 25 MG PO CAPS: 25.0000 mg | ORAL_CAPSULE | Freq: Four times a day (QID) | ORAL | Status: DC | PRN

## 2014-09-10 MED ORDER — WITCH HAZEL-GLYCERIN EX PADS: 1.0000 "application " | MEDICATED_PAD | CUTANEOUS | Status: DC | PRN

## 2014-09-10 MED ORDER — NALBUPHINE HCL 10 MG/ML IJ SOLN: 5.0000 mg | Freq: Once | INTRAMUSCULAR | Status: AC | PRN

## 2014-09-10 MED ORDER — LANOLIN HYDROUS EX OINT: 1.0000 "application " | TOPICAL_OINTMENT | CUTANEOUS | Status: DC | PRN

## 2014-09-10 MED ORDER — SIMETHICONE 80 MG PO CHEW
80.0000 mg | CHEWABLE_TABLET | ORAL | Status: DC | PRN
  Filled 2014-09-10: qty 1

## 2014-09-10 MED ORDER — PROMETHAZINE HCL 25 MG/ML IJ SOLN: 6.2500 mg | INTRAMUSCULAR | Status: DC | PRN

## 2014-09-10 MED ORDER — OXYTOCIN 40 UNITS IN LACTATED RINGERS INFUSION - SIMPLE MED: 62.5000 mL/h | INTRAVENOUS | Status: AC

## 2014-09-10 MED ORDER — SIMETHICONE 80 MG PO CHEW
80.0000 mg | CHEWABLE_TABLET | Freq: Three times a day (TID) | ORAL | Status: DC
  Administered 2014-09-10 – 2014-09-11 (×5): 80 mg via ORAL
  Filled 2014-09-10 (×5): qty 1

## 2014-09-10 MED ORDER — INSULIN ASPART 100 UNIT/ML ~~LOC~~ SOLN
6.0000 [IU] | Freq: Once | SUBCUTANEOUS | Status: AC
  Administered 2014-09-10: 6 [IU] via SUBCUTANEOUS

## 2014-09-10 MED ORDER — ONDANSETRON HCL 4 MG/2ML IJ SOLN: 4.0000 mg | INTRAMUSCULAR | Status: DC | PRN

## 2014-09-10 MED ORDER — DIBUCAINE 1 % RE OINT: 1.0000 "application " | TOPICAL_OINTMENT | RECTAL | Status: DC | PRN

## 2014-09-10 MED ORDER — OXYCODONE-ACETAMINOPHEN 5-325 MG PO TABS
1.0000 | ORAL_TABLET | ORAL | Status: DC | PRN
  Administered 2014-09-11 – 2014-09-13 (×4): 1 via ORAL
  Filled 2014-09-10 (×4): qty 1

## 2014-09-10 MED ORDER — ACETAMINOPHEN 160 MG/5ML PO SOLN: 325.0000 mg | ORAL | Status: DC | PRN

## 2014-09-10 MED ORDER — OXYCODONE-ACETAMINOPHEN 5-325 MG PO TABS
2.0000 | ORAL_TABLET | ORAL | Status: DC | PRN
  Administered 2014-09-11 – 2014-09-14 (×6): 2 via ORAL
  Filled 2014-09-10 (×5): qty 2

## 2014-09-10 NOTE — Progress Notes (Signed)
Critical value called about magnesium. Magnesium 6.5 called to CNM at 1900. Will continue to monitor.

## 2014-09-10 NOTE — Progress Notes (Signed)
Pt bp remains elevated after labetalol.  Pt states that the nurse on the floor told her that the labetalol did not work well for her pressure and she needed a different medication.  Dr Mancel Bale called and orders received for Hydralazine, will given at 5 minute intervals/

## 2014-09-10 NOTE — Addendum Note (Signed)
Addendum  created 09/10/14 4665 by Georgeanne Nim, CRNA   Modules edited: Notes Section   Notes Section:  Pend: [id number]

## 2014-09-10 NOTE — Progress Notes (Signed)
Notified by RN of mag level of 6.8 and persistence of urine output of 30-35 cc/hr. Patient has been up to BR with good tolerance of activity. Filed Vitals:   09/10/14 0800 09/10/14 0900 09/10/14 1000 09/10/14 1349  BP: 132/71 115/66 109/53 98/57  Pulse:      Temp: 98 F (36.7 C)   98 F (36.7 C)  TempSrc:      Resp: 20 22  20   Height:      Weight:      SpO2:    98%   Consulted with Dr. Alesia Richards. Repeat IV bolus Decreased magnesium to 1 gm/hr. Repeat Mag level and CMP at 6pm.  Donnel Saxon, CNM 09/10/14 2:30p

## 2014-09-10 NOTE — Progress Notes (Signed)
Called by AICU for mag level of 6.5. Urine output 100 cc from 1700-1900.  CMP Latest Ref Rng 09/10/2014 09/10/2014 09/09/2014  Glucose 70 - 99 mg/dL 116(H) 197(H) 136(H)  BUN 6 - 23 mg/dL 15 11 9   Creatinine 0.50 - 1.10 mg/dL 1.16(H) 0.83 0.72  Sodium 135 - 145 mmol/L 133(L) 135 135  Potassium 3.5 - 5.1 mmol/L 3.4(L) 3.7 3.3(L)  Chloride 96 - 112 mEq/L 105 106 108  CO2 19 - 32 mmol/L 21 17(L) 17(L)  Calcium 8.4 - 10.5 mg/dL 7.9(L) 8.1(L) 8.4  Total Protein 6.0 - 8.3 g/dL 5.8(L) 5.1(L) 5.7(L)  Total Bilirubin 0.3 - 1.2 mg/dL 0.1(L) 0.4 0.6  Alkaline Phos 39 - 117 U/L 97 94 110  AST 0 - 37 U/L 15 18 19   ALT 0 - 35 U/L 11 12 15    Filed Vitals:   09/10/14 1615 09/10/14 1700 09/10/14 1741 09/10/14 1836  BP:   137/75   Pulse: 84 82    Temp:      TempSrc:      Resp:      Height:      Weight:    319 lb 8 oz (144.924 kg)  SpO2: 97%      Will consult with Dr. Alesia Richards regarding plan of care.  Donnel Saxon, CNM 09/10/14 7:05p

## 2014-09-10 NOTE — Lactation Note (Signed)
This note was copied from the chart of [facility]. Lactation Consultation Note         Initial consult with this mom of a 61 1/[redacted] week gestation baby, now 30 hours old, with borderline one touches. I attempted to latch baby, but he was not able to maintain latch, and was sleepy. Mom has flat nipples, soft, large, flat breasts. The baby did latch with assistance once, suckled once or twice, and slept. I did hand expresion for at least 10 minutes, and was able to finger feed the baby 2 drops of clear colostrum from mom's left breast. i set up a DEP, for mom to pump to protect her supply and supplement John with EBM. Mom is tired now, will nap and begin pumping after nap.Mom's nurse to help mom with this.  I spoke to [place], Doctor, general practice, and we agreed to try formula feeding again at this time. Mom agreed to bottle feeding, and jon took an additional 6 mls fo Alimentum.  On exam of baby's mouth, he as a thick upper lip frenulum with a space in his gums at the bottom of the frenulum. His tongue has a posterior, thick , short frenulum, close to 1/3 or less behind tip of tongue. With finger sucking he has a strong suck, but was not able to pull my finger into his mouth. i did not share my findings with mom at this time.   Patient Name: Lauren Delgado Today's Date: 09/10/2014 Reason for consult: Initial assessment   Maternal Data Formula Feeding for Exclusion: Yes (mom in [place]) Reason for exclusion: Admission to Intensive Care Unit (ICU) post-partum Has patient been taught Hand Expression?: Yes Does the patient have breastfeeding experience prior to this delivery?: No  Feeding Feeding Type: Breast Milk  LATCH Score/Interventions Latch: Too sleepy or reluctant, no latch achieved, no sucking elicited. Intervention(s): Skin to skin;Teach feeding cues;Waking techniques Intervention(s): Adjust position;Assist with latch;Breast massage;Breast compression  Audible Swallowing: None  Type of  Nipple: Flat Intervention(s): Double electric pump  Comfort (Breast/Nipple): Soft / non-tender (large, soft, flat breasts, easy to compress)     Hold (Positioning): Assistance needed to correctly position infant at breast and maintain latch. Intervention(s): Breastfeeding basics reviewed;Support Pillows;Position options;Skin to skin  LATCH Score: 4  Lactation Tools Discussed/Used WIC Program: Yes (m om active with WIC) Pump Review: Setup, frequency, and cleaning Initiated by:: clee rn lc Date initiated:: 09/10/14 (mom to nap now and begin pumping after nap)   Consult Status Consult Status: Follow-up Date: 09/11/14 Follow-up type: In-patient    Tonna Corner 09/10/2014, 11:54 AM

## 2014-09-10 NOTE — Progress Notes (Signed)
Called by AICU RN regarding patient's lower BPs. Patient doing well, no symptoms.  Has ambulated to BR without syncope or dizziness.  Filed Vitals:   09/10/14 1349 09/10/14 1400 09/10/14 1548 09/10/14 1615  BP: 98/57 103/67 108/52   Pulse:    84  Temp: 98 F (36.7 C)     TempSrc:      Resp: 20  18   Height:      Weight:      SpO2: 98%   97%   Labetalol dose at 1548 held due to BP 108/52.  Pulse range 70s.  Urine output has increased: 1431-1500 = 70 cc 1601-1700 = 120 cc  Consulted with Dr. Alesia Richards. Change Labetalol to 300 mg po TID. Hold for systolic < 827 or diastolic < 80.    Will continue to observe.  Donnel Saxon, CNM 09/10/14 5:19p

## 2014-09-10 NOTE — Progress Notes (Signed)
Critical value called from lab at 1340. Magnesium level 6.8. CNM called at 1345, aware of value.  Will place orders and continue to monitor.

## 2014-09-10 NOTE — Addendum Note (Signed)
Addendum  created 09/10/14 1352 by Georgeanne Nim, CRNA   Modules edited: Notes Section   Notes Section:  File: [id number]

## 2014-09-10 NOTE — Progress Notes (Signed)
Pt bp continues to be elevated, Dr. Mancel Bale made aware,orders given for po med and iv labetalol, will continue to manage

## 2014-09-10 NOTE — Progress Notes (Addendum)
Subjective: Postpartum Day 1: Cesarean Delivery due to [facility], chronic hypertension with superimposed pre-eclampsia, Type 2 DM Patient has stood at bedside, reports no syncope or dizziness. Feeding:  Breastfeeding Contraceptive plan:  Undecided  Objective: Vital signs in last 24 hours: Temp:  [97.4 F (36.3 C)-98.4 F (36.9 C)] 98 F (36.7 C) (01/08 0800) Pulse Rate:  [87-99] 95 (01/08 0400) Resp:  [16-30] 22 (01/08 0900) BP: (115-198)/(65-120) 115/66 mmHg (01/08 0900) SpO2:  [94 %-98 %] 98 % (01/08 0400)   No weight yet today.  Total output since surgery:  1100 cc, but 35 cc from 0600-0700 Hourly output since surgery: 2300-0000 = 300 0101-0200 = 400 0201-0300 = 100 0401-0500 = 100 0501-0600 = 75 0601-0700 = 35 0700-1000 = 100  (approx 33 cc/hr)  On magnesium 2 gm/hr.  Filed Vitals:   09/10/14 0600 09/10/14 0700 09/10/14 0800 09/10/14 0900  BP: 141/70 126/65 132/71 115/66  Pulse:      Temp:   98 F (36.7 C)   TempSrc:      Resp:   20 22  Height:      Weight:      SpO2:        Physical Exam:  General: alert Lochia: appropriate Uterine Fundus: firm Abdomen:  + bowel sounds, NT Incision: Dressing CDI DVT Evaluation: No evidence of DVT seen on physical exam. Negative Homan's sign. Foley draining clear urine Calf/Ankle edema is present, 1-2+, DTR 1+, no clonus JP drain: 10 cc serosanguinous drainage      Recent Labs  09/09/14 0622 09/09/14 1820 09/10/14 0540  HGB 10.6* 11.2* 10.6*  HCT 31.2* 32.9* 31.9*  WBC 11.2* 13.1* 15.7*   CMP     Component Value Date/Time   NA 135 09/10/2014 0540   K 3.7 09/10/2014 0540   CL 106 09/10/2014 0540   CO2 17* 09/10/2014 0540   GLUCOSE 197* 09/10/2014 0540   BUN 11 09/10/2014 0540   CREATININE 0.83 09/10/2014 0540   CALCIUM 8.1* 09/10/2014 0540   PROT 5.1* 09/10/2014 0540   ALBUMIN 2.0* 09/10/2014 0540   AST 18 09/10/2014 0540   ALT 12 09/10/2014 0540   ALKPHOS 94 09/10/2014 0540   BILITOT 0.4 09/10/2014  0540   GFRNONAA >90 09/10/2014 0540   GFRAA >90 09/10/2014 0540   Magnesium level at 0540 = 5.5  CBG (last 3)   Recent Labs  09/10/14 0014 09/10/14 0544 09/10/14 0815  GLUCAP 168* 195* 157*   . acetaminophen  1,000 mg Oral Q6H  . glyBURIDE  2.5 mg Oral BID WC  . ibuprofen  600 mg Oral 4 times per day  . insulin aspart  2-6 Units Subcutaneous TID PC  . labetalol  400 mg Oral 3 times per day  . lactated ringers  300 mL Intravenous Once  . measles, mumps and rubella vaccine  0.5 mL Subcutaneous Once  . nalbuphine      . prenatal multivitamin  1 tablet Oral Q1200  . senna-docusate  2 tablet Oral Q24H  . simethicone  80 mg Oral TID PC  . simethicone  80 mg Oral Q24H  . [START ON 09/11/2014] Tdap  0.5 mL Intramuscular Once  . valACYclovir  500 mg Oral Daily   Sliding scale insulin begun this am, due to elevations  Assessment/Plan: Status post Cesarean section day 1 Chronic hypertension with superimposed pre-eclampsia--on magnesium Type 2 DM--on Glyburide 2.5 mg BID Morbid obesity Decreased urine output this am Rubella non-immune Varicella non-immune  Plan: Dr. Alesia Richards in to  see patient IV bolus--300 cc now Mag level at noon Watch urine output carefully. Hydralazine IV for BP elevations--if unresponsive to Apresoline, will give IV Labetalol. Daily weights Offer MMR and varicella vaccine   Donnel Saxon CNM, [place] 09/10/2014, 10:07 AM  I saw and examined patient at bedside and agree with above findings, assessment and plan as outlined by Davis Gourd.  Dr.  Alesia Richards.

## 2014-09-10 NOTE — Anesthesia Postprocedure Evaluation (Signed)
  Anesthesia Post-op Note  Patient: Lauren Delgado  Procedure(s) Performed: Procedure(s): CESAREAN SECTION (N/A)  Patient Location: ICU  Anesthesia Type:Epidural  Level of Consciousness: awake, alert , oriented and patient cooperative  Airway and Oxygen Therapy: Patient Spontanous Breathing  Post-op Pain: mild  Post-op Assessment: Patient's Cardiovascular Status Stable, Respiratory Function Stable, No headache, No backache, No residual numbness and No residual motor weakness  Post-op Vital Signs: stable  Last Vitals:  Filed Vitals:   09/10/14 0700  BP: 126/65  Pulse:   Temp:   Resp:     Complications: No apparent anesthesia complications

## 2014-09-11 DIAGNOSIS — Z98891 History of uterine scar from previous surgery: Secondary | ICD-10-CM

## 2014-09-11 LAB — CBC
HCT: 26.1 % — ABNORMAL LOW (ref 36.0–46.0)
HCT: 29.4 % — ABNORMAL LOW (ref 36.0–46.0)
Hemoglobin: 9.1 g/dL — ABNORMAL LOW (ref 12.0–15.0)
Hemoglobin: 9.9 g/dL — ABNORMAL LOW (ref 12.0–15.0)
MCH: 29 pg (ref 26.0–34.0)
MCH: 29.9 pg (ref 26.0–34.0)
MCHC: 33.7 g/dL (ref 30.0–36.0)
MCHC: 34.9 g/dL (ref 30.0–36.0)
MCV: 85.9 fL (ref 78.0–100.0)
MCV: 86.2 fL (ref 78.0–100.0)
Platelets: 238 10*3/uL (ref 150–400)
Platelets: 294 10*3/uL (ref 150–400)
RBC: 3.04 MIL/uL — ABNORMAL LOW (ref 3.87–5.11)
RBC: 3.41 MIL/uL — ABNORMAL LOW (ref 3.87–5.11)
RDW: 15 % (ref 11.5–15.5)
RDW: 15 % (ref 11.5–15.5)
WBC: 13.1 10*3/uL — ABNORMAL HIGH (ref 4.0–10.5)
WBC: 15.3 10*3/uL — ABNORMAL HIGH (ref 4.0–10.5)

## 2014-09-11 LAB — COMPREHENSIVE METABOLIC PANEL
ALT: 10 U/L (ref 0–35)
ALT: 11 U/L (ref 0–35)
AST: 16 U/L (ref 0–37)
AST: 20 U/L (ref 0–37)
Albumin: 1.8 g/dL — ABNORMAL LOW (ref 3.5–5.2)
Albumin: 2.2 g/dL — ABNORMAL LOW (ref 3.5–5.2)
Alkaline Phosphatase: 108 U/L (ref 39–117)
Alkaline Phosphatase: 90 U/L (ref 39–117)
Anion gap: 6 (ref 5–15)
Anion gap: 7 (ref 5–15)
BUN: 19 mg/dL (ref 6–23)
BUN: 19 mg/dL (ref 6–23)
CO2: 21 mmol/L (ref 19–32)
CO2: 22 mmol/L (ref 19–32)
Calcium: 7.4 mg/dL — ABNORMAL LOW (ref 8.4–10.5)
Calcium: 8.2 mg/dL — ABNORMAL LOW (ref 8.4–10.5)
Chloride: 107 mEq/L (ref 96–112)
Chloride: 107 mEq/L (ref 96–112)
Creatinine, Ser: 1.06 mg/dL (ref 0.50–1.10)
Creatinine, Ser: 1.31 mg/dL — ABNORMAL HIGH (ref 0.50–1.10)
GFR calc Af Amer: 64 mL/min — ABNORMAL LOW (ref >90)
GFR calc Af Amer: 83 mL/min — ABNORMAL LOW (ref >90)
GFR calc non Af Amer: 55 mL/min — ABNORMAL LOW (ref >90)
GFR calc non Af Amer: 71 mL/min — ABNORMAL LOW (ref >90)
Glucose, Bld: 101 mg/dL — ABNORMAL HIGH (ref 70–99)
Glucose, Bld: 169 mg/dL — ABNORMAL HIGH (ref 70–99)
Potassium: 3.3 mmol/L — ABNORMAL LOW (ref 3.5–5.1)
Potassium: 3.6 mmol/L (ref 3.5–5.1)
Sodium: 134 mmol/L — ABNORMAL LOW (ref 135–145)
Sodium: 136 mmol/L (ref 135–145)
Total Bilirubin: 0.1 mg/dL — ABNORMAL LOW (ref 0.3–1.2)
Total Bilirubin: 0.3 mg/dL (ref 0.3–1.2)
Total Protein: 4.9 g/dL — ABNORMAL LOW (ref 6.0–8.3)
Total Protein: 5.5 g/dL — ABNORMAL LOW (ref 6.0–8.3)

## 2014-09-11 LAB — MAGNESIUM
Magnesium: 4.5 mg/dL — ABNORMAL HIGH (ref 1.5–2.5)
Magnesium: 6.2 mg/dL (ref 1.5–2.5)

## 2014-09-11 LAB — GLUCOSE, CAPILLARY
Glucose-Capillary: 109 mg/dL — ABNORMAL HIGH (ref 70–99)
Glucose-Capillary: 149 mg/dL — ABNORMAL HIGH (ref 70–99)
Glucose-Capillary: 121 mg/dL — ABNORMAL HIGH (ref 70–99)
Glucose-Capillary: 82 mg/dL (ref 70–99)
Glucose-Capillary: 72 mg/dL (ref 70–99)
Glucose-Capillary: 75 mg/dL (ref 70–99)
Glucose-Capillary: 72 mg/dL (ref 70–99)

## 2014-09-11 LAB — LACTATE DEHYDROGENASE: LDH: 190 U/L (ref 94–250)

## 2014-09-11 LAB — TYPE AND SCREEN
ABO/RH(D): B POS
Antibody Screen: NEGATIVE
Unit division: 0
Unit division: 0

## 2014-09-11 LAB — URIC ACID: Uric Acid, Serum: 6.8 mg/dL (ref 2.4–7.0)

## 2014-09-11 MED ORDER — IBUPROFEN 600 MG PO TABS
600.0000 mg | ORAL_TABLET | Freq: Four times a day (QID) | ORAL | Status: DC | PRN
  Administered 2014-09-12 – 2014-09-13 (×3): 600 mg via ORAL
  Filled 2014-09-11 (×3): qty 1

## 2014-09-11 MED ORDER — MEASLES, MUMPS & RUBELLA VAC ~~LOC~~ INJ
0.5000 mL | INJECTION | SUBCUTANEOUS | Status: AC | PRN
  Administered 2014-09-14: 0.5 mL via SUBCUTANEOUS

## 2014-09-11 MED ORDER — LABETALOL HCL 200 MG PO TABS
400.0000 mg | ORAL_TABLET | Freq: Three times a day (TID) | ORAL | Status: DC
  Administered 2014-09-12: 400 mg via ORAL
  Filled 2014-09-11: qty 2

## 2014-09-11 MED ORDER — MAGNESIUM SULFATE 40 G IN LACTATED RINGERS - SIMPLE
0.6400 g/h | INTRAVENOUS | Status: DC
  Filled 2014-09-11: qty 500

## 2014-09-11 MED ORDER — GLYBURIDE 5 MG PO TABS
5.0000 mg | ORAL_TABLET | Freq: Two times a day (BID) | ORAL | Status: DC
Start: 2014-09-11 — End: 2014-09-15
  Administered 2014-09-11 – 2014-09-14 (×7): 5 mg via ORAL
  Filled 2014-09-11 (×8): qty 1

## 2014-09-11 NOTE — Progress Notes (Signed)
Subjective: Postpartum Day 2: Cesarean Delivery due to [facility], chronic hypertension with superimposed pre-eclampsia, Type 2 DM  Patient resting comfortably at bedside, no acute complaints.  No headache, no blurry vision, no RUQ pain.  Reports some incisional pain with movement.  No F/C/CP/SOB.  Pt up voiding freely.  +flatus, no BM.  Minimal lochia Feeding:  Breastfeeding Contraceptive plan:  Undecided  Objective: Vital signs in last 24 hours: Temp:  [97.9 F (36.6 C)-98.5 F (36.9 C)] 98.5 F (36.9 C) (01/09 0655) Pulse Rate:  [82-105] 96 (01/09 0740) Resp:  [16-30] 20 (01/09 0734) BP: (98-174)/(52-94) 131/62 mmHg (01/09 0740) SpO2:  [91 %-100 %] 95 % (01/09 0740) Weight:  [144.924 kg (319 lb 8 oz)-145.605 kg (321 lb)] 145.605 kg (321 lb) (01/09 0420)  UOP: 855/8hr  Physical Exam:  General: alert  CV: RRR Lungs: CTAB Lochia: appropriate Uterine Fundus: firm, below umbilicus- difficult to assess due to obesity Abdomen:  + bowel sounds, NT Incision: Dressing CDI JP drain in place with serosanguinous fluid- 15cc/8hr DVT Evaluation: No evidence of DVT seen on physical exam, Negative Homan's sign, 2+ pitting edema bilaterally, DTR 1+, no clonus   Recent Labs  09/10/14 0540 09/10/14 2357 09/11/14 0645  HGB 10.6* 9.9* 9.1*  HCT 31.9* 29.4* 26.1*  WBC 15.7* 15.3* 13.1*   CMP     Component Value Date/Time   NA 134* 09/11/2014 0645   K 3.3* 09/11/2014 0645   CL 107 09/11/2014 0645   CO2 21 09/11/2014 0645   GLUCOSE 169* 09/11/2014 0645   BUN 19 09/11/2014 0645   CREATININE 1.06 09/11/2014 0645   CALCIUM 7.4* 09/11/2014 0645   PROT 4.9* 09/11/2014 0645   ALBUMIN 1.8* 09/11/2014 0645   AST 16 09/11/2014 0645   ALT 10 09/11/2014 0645   ALKPHOS 90 09/11/2014 0645   BILITOT 0.3 09/11/2014 0645   GFRNONAA 71* 09/11/2014 0645   GFRAA 83* 09/11/2014 0645    Assessment/Plan: Status post Cesarean section day 2 1) Chronic hypertension with superimposed pre-eclampsia  Due to  elevated Cr, pt was continued on Magnesium overnight. This am both Cr and UOP have improved, will discontinue Magnesium this am.  Continue Labetalol 300mg  tid, Hydralazine IV given once overnight- if pt continues to need IV medication, will increase Labetalol to 400mg    Repeat labs in am 2)Type 2 DM  Due to elevation in am and addition of sliding scale- will remove sliding scale and change to glyburide 5mg  twice daily 3) Morbid obesity- encourage ambulation, SCDs while in bed 4)Rubella & Varicella non-immune, will need vaccinations prior to discharge  Plan to transfer from AICU once patient is stable off Magnesium x 4hr.  Continue with postoperative care.  Janyth Pupa, DO [phone] (pager) [phone] (office)

## 2014-09-11 NOTE — Progress Notes (Addendum)
Pt has critical lab value of 6.2 magnesium. Telephone with readback by Fayne Mediate. Contacted Dr. Nelda Marseille. Will continue on magnesium at 1g and lactated ringer at 124mL/hr and repeat labs in the AM.  ADDENDUM- Due to increasing Cr.  Will decrease IV Magnesium to 0.64g/hr and decrease fluids to 75cc/hr.  Janyth Pupa, DO [phone] (pager) [phone] (office)

## 2014-09-11 NOTE — Progress Notes (Signed)
   09/11/14 0500  Vitals  BP (!) 172/86 mmHg  MAP (mmHg) 106  BP Location Right Arm  BP Method Automatic  Patient Position (if appropriate) Lying  Hydralazine 10 mg IV administered as ordered. Patient education done. Patient verbalized understanding. We will continue to monitor.

## 2014-09-11 NOTE — Lactation Note (Signed)
This note was copied from the chart of [place]. Lactation Consultation Note  Patient Name: Lauren Delgado Today's Date: 09/11/2014   Visited with Mom, baby at 79 hrs old.  Mom has been double pumping and bottle feeding formula up to 15-20 ml today.  Encouraged skin to skin, and offered assistance at next breast feeding.  AICU RN assisted Mom with last breast feeding, and baby was able to latch.  She fed baby off and on for 60 mins.  Unable to manually express any colostrum, or express any colostrum with the pump.  Reassured Mom that this was WNL and to continue with what she is doing.  To call for assist at next feeding.   [name] 09/11/2014, 12:36 PM

## 2014-09-11 NOTE — Progress Notes (Signed)
Dr Nelda Marseille notified of BP at 1525: 164/84 after walking from AICU.  Current BP 190/84. Reviewed Apresoline order. Dr Nelda Marseille will increase labetalol dose to 400mg 

## 2014-09-12 ENCOUNTER — Encounter (HOSPITAL_COMMUNITY): Payer: Self-pay | Admitting: Anesthesiology

## 2014-09-12 LAB — GLUCOSE, CAPILLARY
Glucose-Capillary: 80 mg/dL (ref 70–99)
Glucose-Capillary: 130 mg/dL — ABNORMAL HIGH (ref 70–99)

## 2014-09-12 LAB — CBC
HCT: 28.6 % — ABNORMAL LOW (ref 36.0–46.0)
Hemoglobin: 9.7 g/dL — ABNORMAL LOW (ref 12.0–15.0)
MCH: 29.3 pg (ref 26.0–34.0)
MCHC: 33.9 g/dL (ref 30.0–36.0)
MCV: 86.4 fL (ref 78.0–100.0)
Platelets: 278 10*3/uL (ref 150–400)
RBC: 3.31 MIL/uL — ABNORMAL LOW (ref 3.87–5.11)
RDW: 15.2 % (ref 11.5–15.5)
WBC: 13.2 10*3/uL — ABNORMAL HIGH (ref 4.0–10.5)

## 2014-09-12 LAB — COMPREHENSIVE METABOLIC PANEL
ALT: 11 U/L (ref 0–35)
AST: 20 U/L (ref 0–37)
Albumin: 2.3 g/dL — ABNORMAL LOW (ref 3.5–5.2)
Alkaline Phosphatase: 83 U/L (ref 39–117)
Anion gap: 7 (ref 5–15)
BUN: 16 mg/dL (ref 6–23)
CO2: 20 mmol/L (ref 19–32)
Calcium: 8.1 mg/dL — ABNORMAL LOW (ref 8.4–10.5)
Chloride: 109 mEq/L (ref 96–112)
Creatinine, Ser: 0.65 mg/dL (ref 0.50–1.10)
GFR calc Af Amer: >90 mL/min (ref >90)
GFR calc non Af Amer: >90 mL/min (ref >90)
Glucose, Bld: 88 mg/dL (ref 70–99)
Potassium: 3.2 mmol/L — ABNORMAL LOW (ref 3.5–5.1)
Sodium: 136 mmol/L (ref 135–145)
Total Bilirubin: 0.2 mg/dL — ABNORMAL LOW (ref 0.3–1.2)
Total Protein: 6.2 g/dL (ref 6.0–8.3)

## 2014-09-12 MED ORDER — NIFEDIPINE ER OSMOTIC RELEASE 30 MG PO TB24
30.0000 mg | ORAL_TABLET | Freq: Every day | ORAL | Status: DC
  Administered 2014-09-12 – 2014-09-13 (×2): 30 mg via ORAL
  Filled 2014-09-12 (×2): qty 1

## 2014-09-12 MED ORDER — HYDROCHLOROTHIAZIDE 25 MG PO TABS
25.0000 mg | ORAL_TABLET | Freq: Every day | ORAL | Status: DC
  Administered 2014-09-12 – 2014-09-13 (×2): 25 mg via ORAL
  Filled 2014-09-12 (×3): qty 1

## 2014-09-12 MED ORDER — LABETALOL HCL 200 MG PO TABS
200.0000 mg | ORAL_TABLET | Freq: Once | ORAL | Status: DC
  Filled 2014-09-12 (×2): qty 1

## 2014-09-12 MED ORDER — LABETALOL HCL 200 MG PO TABS
400.0000 mg | ORAL_TABLET | Freq: Four times a day (QID) | ORAL | Status: DC
  Administered 2014-09-12 (×2): 400 mg via ORAL
  Filled 2014-09-12 (×2): qty 2

## 2014-09-12 MED ORDER — LABETALOL HCL 5 MG/ML IV SOLN: 20.0000 mg | Freq: Once | INTRAVENOUS | Status: DC

## 2014-09-12 MED ORDER — LABETALOL HCL 200 MG PO TABS
600.0000 mg | ORAL_TABLET | Freq: Four times a day (QID) | ORAL | Status: DC
  Administered 2014-09-12 – 2014-09-14 (×9): 600 mg via ORAL
  Filled 2014-09-12 (×10): qty 3

## 2014-09-12 NOTE — Progress Notes (Signed)
Pt BP within parameters after po dose of labetalol.  IV dose not given per MD orders.  Documented on MAR and wasted in the sink.  Continue with current POC and notify MD prn.  Report given to  Herbert Pun, RN.

## 2014-09-12 NOTE — Progress Notes (Addendum)
At 11:00, patient's BP was 210/84.  No PIH symptoms noted from patient.  Gave patient dose of 400mg  Labetalol and 10mg  of Hydralazine.  Rechecked BP after 20 minutes at 12:10 it was 183/94.  Gave patient dose of 20mg  Hydralazine @ 12:40 per orders from Dr. Nelda Marseille.  Recheck BP in 20 minutes.  At 1:10pm, patient's BP was 152/71.  Dr. Nelda Marseille notified of patient's current status.  Will resume checking BP every 4hrs at 1600 with BP parameters ordered.  New orders for Labetalol noted in the [facility]. Cox, Gwendlyn Hanback M

## 2014-09-12 NOTE — Progress Notes (Signed)
Pt's BP remains unchanged after two doses of IV Apresoline.  Dr Nelda Marseille notifited of readings.  Orders received for IV labetalol now.  Recheck BP 30" after labetalol administered and notify MD if BP remains elevated or prn.  Pt updated regarding plan of care and verbalizes understanding.

## 2014-09-12 NOTE — Progress Notes (Signed)
Dr. Nelda Marseille notified of BP 185/82 and change of shift instructions and parameters for this patient.  Dr. Nelda Marseille ordered for her parameters to be changed to SBP >/= 180 or DBP >/= 100.  She also ordered for BP to be checked every 4 hours once stabilized below parameters.  We are to also start Procardia XL today. Cox, Sofia Vanmeter M

## 2014-09-12 NOTE — Progress Notes (Signed)
Subjective: Postpartum Day 3: Cesarean Delivery due to [facility], chronic hypertension with superimposed pre-eclampsia, Type 2 DM  Patient resting comfortably at bedside, no acute complaints.  No headache, no blurry vision, no RUQ pain. Pain controlled with medication.  No F/C/CP/SOB.  Pt up voiding freely.  +flatus, + BM.  Minimal lochia Feeding:  Breastfeeding Contraceptive plan:  Undecided  Objective: Vital signs in last 24 hours: Temp:  [98.2 F (36.8 C)] 98.2 F (36.8 C) (01/09 1525) Pulse Rate:  [90-105] 99 (01/10 0640) Resp:  [18-22] 20 (01/10 0640) BP: (131-209)/(54-92) 155/77 mmHg (01/10 0640) SpO2:  [94 %-99 %] 99 % (01/10 0412)  UOP: 855/8hr  Physical Exam:  General: alert  CV: RRR Lungs: CTAB Lochia: appropriate Uterine Fundus: firm, below umbilicus- difficult to assess due to obesity Abdomen:  + bowel sounds, NT Incision: Dressing CDI JP drain in place with serosanguinous fluid-25cc/24hr DVT Evaluation: No evidence of DVT seen on physical exam, Negative Homan's sign, 1+ pitting edema bilaterally, DTR 1+, no clonus   Recent Labs  09/10/14 2357 09/11/14 0645 09/12/14 0630  HGB 9.9* 9.1* 9.7*  HCT 29.4* 26.1* 28.6*  WBC 15.3* 13.1* 13.2*   CMP     Component Value Date/Time   NA 136 09/12/2014 0630   K 3.2* 09/12/2014 0630   CL 109 09/12/2014 0630   CO2 20 09/12/2014 0630   GLUCOSE 88 09/12/2014 0630   BUN 16 09/12/2014 0630   CREATININE 0.65 09/12/2014 0630   CALCIUM 8.1* 09/12/2014 0630   PROT 6.2 09/12/2014 0630   ALBUMIN 2.3* 09/12/2014 0630   AST 20 09/12/2014 0630   ALT 11 09/12/2014 0630   ALKPHOS 83 09/12/2014 0630   BILITOT 0.2* 09/12/2014 0630   GFRNONAA >90 09/12/2014 0630   GFRAA >90 09/12/2014 0630    Assessment/Plan: Status post Cesarean section day 3 1) Chronic hypertension with superimposed pre-eclampsia  Overnight, patient had elevated BP and required IV Hydralazine 10mg  then 20mg  IV.  Labetalol changed to 400mg  4x daily  S/p  Magensium x 24hr  Labs including Cr now within normal limits, no further lab work needed  Pt asymptomatic 2)Type 2 DM  Glyburide 5mg  bid  Accuchecks improved, will continue to monitor 3) Morbid obesity- encourage ambulation, SCDs while in bed 4)Rubella & Varicella non-immune, will need vaccinations prior to discharge  DISPO: Continue to work on BP management, continue postoperative care.    Janyth Pupa, DO [phone] (pager) [phone] (office)

## 2014-09-12 NOTE — Progress Notes (Signed)
Pt's BP elevated out of acceptable range at 4 am assessment.  Apresoline given IV per MD orders and BP rechecked 20" after dose administered.  BP still elevated.  Dr Nelda Marseille notified via telephone call of pt's continued elevated BP, no other PIH symptoms.  Advised to continue with current plan of care by administering another dose of Apresoline IV and recheck BP in 20-25".  If BP remains elevated after second dose, notify MD for further orders.

## 2014-09-12 NOTE — Progress Notes (Signed)
Called to room by care nurse, pt c/o and nurse concerned about the pt legs are more swollen that earlier today, with more swelling in the left than the right.  Pt sitting in the recliner.  Subjective: Postpartum Day 3: Cesarean Delivery due to NRFHT/failed IOL Patient up ad lib, reports no syncope or dizziness. Feeding: breastfeeding Contraceptive plan:  unsure  Objective: Vital signs in last 24 hours: Temp:  [98.2 F (36.8 C)] 98.2 F (36.8 C) (01/10 0820) Pulse Rate:  [91-105] 99 (01/10 1310) Resp:  [20-22] 20 (01/10 0820) BP: (152-210)/(59-94) 186/86 mmHg (01/10 1731) SpO2:  [98 %-99 %] 99 % (01/10 0412)  Filed Vitals:   09/12/14 1210 09/12/14 1220 09/12/14 1310 09/12/14 1731  BP: 183/94 183/94 152/71 186/86  Pulse: 96  99   Temp:      TempSrc:      Resp:      Height:      Weight:      SpO2:       Nurse reports hydralazine was given x2 today   Physical Exam:  General: alert, cooperative and morbidly obese Lochia: appropriate Incision: healing well  pressure dressing intact DVT Evaluation: No evidence of DVT seen on physical exam. No cords or calf tenderness. +3 edema no redness starting at the ankle, left ankle painful to palpate. no change in tempeture in the either limbs    Recent Labs  09/10/14 2357 09/11/14 0645 09/12/14 0630  HGB 9.9* 9.1* 9.7*  HCT 29.4* 26.1* 28.6*  WBC 15.3* 13.1* 13.2*    Assessment: Status post Cesarean section day 3. Doing well postoperatively.  pressuring in place, no significant drainage JP drain in place, no significant drainage  Anemia - hemodynamicly stable.    Plan: Continue current care.  Consult with Dr Nelda Marseille 25mg  HST Possible consult with internal medicine tomorrow    Reeya Bound, CNM, MSN 09/12/2014. 5:36 PM

## 2014-09-12 NOTE — Progress Notes (Signed)
Dr. Nelda Marseille notified of BP of 186/86 and patient's concerns of swelling in the feet and legs, more than this morning.  Patient's legs are not warm/hot to the touch; no pain in calves, only around ankles and tops of feet.  Left more bothersome than right.   Labetalol 600mg  and 2 Percocet given.   Lauren Delgado, Lauren Delgado

## 2014-09-12 NOTE — Progress Notes (Signed)
Patient's blood pressure is over the parameters at this time (see VS flowsheet).  Patient is due for po labetalol now.  Called to notify Dr Nelda Marseille of patient's current blood pressure.  Dr Nelda Marseille states to give the labetalol dose now and wait 30" after administration and then recheck pt's BP.  Hold apresoline dose at this time.  If BP remains elevated after po labetalol, administer apresoline per orders.  Notify MD prn.

## 2014-09-12 NOTE — Progress Notes (Signed)
Called and notified Dr Nelda Marseille of postprandial blood sugars in the 70's despite dinner and two snacks.  Pt asymptomatic and declines additional blood sugars at this time.  No new orders received from MD at this time.  Will notify MD if patient is symptomatic or prn.

## 2014-09-12 NOTE — Progress Notes (Signed)
Pt's BP is in acceptable range after po labetalol.  No apresoline given at this time.  Will continue evaluating patient per plan of care or prn.

## 2014-09-12 NOTE — Progress Notes (Signed)
Unable to access pt IV to give IV dose of labetalol, resistance met.  Dr Nelda Marseille notified with this information.  OK to give po dose of labetalol now and recheck BP after IV is restarted.  If BP within parameters, do not give IV dose of labetalol.  Discussed updated plan of care with patient, and she verbalizes understanding.  Will notify MD prn.

## 2014-09-13 ENCOUNTER — Encounter (HOSPITAL_COMMUNITY): Payer: Self-pay | Admitting: Obstetrics and Gynecology

## 2014-09-13 DIAGNOSIS — O119 Pre-existing hypertension with pre-eclampsia, unspecified trimester: Secondary | ICD-10-CM

## 2014-09-13 DIAGNOSIS — O10019 Pre-existing essential hypertension complicating pregnancy, unspecified trimester: Secondary | ICD-10-CM

## 2014-09-13 LAB — GLUCOSE, CAPILLARY
Glucose-Capillary: 101 mg/dL — ABNORMAL HIGH (ref 70–99)
Glucose-Capillary: 103 mg/dL — ABNORMAL HIGH (ref 70–99)
Glucose-Capillary: 103 mg/dL — ABNORMAL HIGH (ref 70–99)
Glucose-Capillary: 107 mg/dL — ABNORMAL HIGH (ref 70–99)
Glucose-Capillary: 162 mg/dL — ABNORMAL HIGH (ref 70–99)

## 2014-09-13 MED ORDER — NIFEDIPINE ER OSMOTIC RELEASE 30 MG PO TB24
60.0000 mg | ORAL_TABLET | Freq: Every day | ORAL | Status: DC
  Administered 2014-09-14: 60 mg via ORAL
  Filled 2014-09-13: qty 2

## 2014-09-13 MED ORDER — POTASSIUM CHLORIDE CRYS ER 20 MEQ PO TBCR
40.0000 meq | EXTENDED_RELEASE_TABLET | Freq: Every day | ORAL | Status: DC
  Administered 2014-09-13: 40 meq via ORAL
  Filled 2014-09-13 (×3): qty 2

## 2014-09-13 MED ORDER — FUROSEMIDE 10 MG/ML IJ SOLN
20.0000 mg | Freq: Two times a day (BID) | INTRAMUSCULAR | Status: DC
  Administered 2014-09-13: 40 mg via INTRAVENOUS
  Administered 2014-09-14 (×2): 20 mg via INTRAVENOUS
  Filled 2014-09-13 (×4): qty 2

## 2014-09-13 NOTE — Progress Notes (Addendum)
Lauren Delgado [id number] Postpartum Day 4 S/P Unscheduled Primary Cesarean Section due to NRFHT during IOL for Maternal CHTN and DM-Type 2  Subjective: Patient up ad lib, denies syncope or dizziness. Reports consuming regular diet without issues and denies N/V. Patient reports bowel movement and is passing flatus.  Denies issues with urination and reports bleeding is "good, I'm wearing a regular pad."  Patient is bottlefeeding and hopes to breast feed when "milk comes in."   Desires for postpartum contraception not reviewed.  Pain is being appropriately managed with use of ibuprofen and percocet. Patient denies headache, visual disturbance, numbness, and reports pedial and calf edema is better than yesterday.    Objective: Temp:  [98.4 F (36.9 C)-98.9 F (37.2 C)] 98.4 F (36.9 C) (01/11 0506) Pulse Rate:  [86-99] 94 (01/11 0550) Resp:  [22-24] 22 (01/11 0506) BP: (140-186)/(71-96) 158/96 mmHg (01/11 0550) SpO2:  [94 %] 94 % (01/10 2100)   Recent Labs  09/10/14 2357 09/11/14 0645 09/12/14 0630  HGB 9.9* 9.1* 9.7*  HCT 29.4* 26.1* 28.6*  WBC 15.3* 13.1* 13.2*    Physical Exam:  General: alert, cooperative and no distress Lochia: appropriate Uterine Fundus: firm Abdomen:  + bowel sounds, Soft, Appropriately Tender Incision: no significant drainage from honeycomb dressing  DVT Evaluation: No evidence of DVT seen on physical exam. Calf/Ankle edema is present. JP drain: 53mL of serosanguinous drainage  Assessment Post Operative Day 4 S/P Unshceduled Primary C/S  Normal Involution BreastFeeding Hemodynamically Stable Poorly Controlled HTN  Plan: -Patient status discussed with Dr. Cletis Media who advised as below -Increase procardia -Monitor b/p after 12pm dose of Labetalol -Plan for discharge if bp remain stable -Schedule for Internal medicine consult -Continue other mgmt as ordered  [facility], [place] MSN, CNM 09/13/2014, 11:50 AM   Intractable HTN despite  aggressive management Called for Internal medicine consultation with [facility]: Dr Karleen Hampshire will see the patient and call me back

## 2014-09-13 NOTE — Progress Notes (Signed)
Ur chart review completed.  

## 2014-09-13 NOTE — Consult Note (Signed)
[facility] Medical Consultation  AKIYAH EPPOLITO TKZ:[id number] DOB: [birth year] DOA: 09/07/2014 PCP: No primary care provider on file.   Requesting physician: Dr Cletis Media Date of consultation: 09/13/2014 Reason for consultation: uncontrolled hypertension.   Impression/Recommendations Active Problems:   Pre-eclampsia superimposed on chronic hypertension, antepartum   Status post primary low transverse cesarean section    1. Post partum hypertension with pre eclampsia:  Patient appears to have chronic hypertension, with the above risk factors like obesity, diabetes mellitus, on NSAIDS , fluid retention . She is currently not lactating.  NSAIDS can cause fluid retention and increase in the blood pressure.   Recommend stopping NSAIDS and use percocet instead for pain..   Stop hydrochlorothiazide and use lasix instead 20 mg q12 hrs for one week if needed.  Recommend potassium supplementation while on lasix.  Recommend low sodium diet.  Continue procardia and labetalol at the current dose and we have room to increase procardia to 120 mg daily if needed.  We can allow blood pressure around 140/80's at the time of discharge.  Order 12 lead EKG for baseline rhythm and axis. Recommend follow up with internist/ PCP in one week for the management of HTN.    Diabetes Mellitus: hgba1c  Ordered .Her hgba1c is around 7 in 2008.  CBG (last 3)   Recent Labs  09/13/14 0504 09/13/14 1054 09/13/14 1321  GLUCAP 101* 103* 103*    Resume glyburide .    Hypokalemia : replete as needed.       I will followup again tomorrow. Please contact me if I can be of assistance in the meanwhile. Thank you for this consultation.  Chief Complaint: uncontrolled hypertension.   HPI:  29 [facility] unscheduled primary Cesarean section, presents with post partum uncontrolled hypertension and type 2 diabetes mellitus. Patient is not aware that she had HTN or DM  prior to delivery or pregnancy. She denies any complaints of headache, dizziness, sob, chest pain, nausea, vomiting or abdominal pain. She denies any diarrhea, burning micturition. She was started on  Procardia and labetalol. She reports pedal edema worsened after delivery.   Review of Systems:  See HPI for pertinent positives, other wise negative.   Past Medical History  Diagnosis Date  . Gestational diabetes mellitus, antepartum   . Hypertension    Past Surgical History  Procedure Laterality Date  . No past surgeries    . Cesarean section N/A 09/09/2014    Procedure: CESAREAN SECTION;  Surgeon: Delice Lesch, MD;  Location: [facility];  Service: Obstetrics;  Laterality: N/A;   Social History:  reports that she has never smoked. She has never used smokeless tobacco. She reports that she does not drink alcohol or use illicit drugs.  No Known Allergies Family History  Problem Relation Age of Onset  . Arthritis Mother   . Depression Mother   . Hypertension Mother   . Miscarriages / [place] Mother   . Arthritis Father   . Hypertension Father   . Vision loss Father   . Asthma Brother   . Depression Brother   . Hypertension Brother   . Learning disabilities Brother   . Cancer Maternal Aunt   . COPD Maternal Aunt   . Hypertension Maternal Aunt   . Miscarriages / Stillbirths Maternal Aunt   . Heart disease Maternal Uncle   . Hypertension Maternal Uncle   . Learning disabilities Maternal Uncle   . Mental retardation Maternal Uncle   . Hypertension  Paternal Aunt   . Hypertension Paternal Uncle   . Learning disabilities Paternal Uncle   . Mental retardation Paternal Uncle   . Arthritis Maternal Grandmother   . Diabetes Maternal Grandmother   . Stroke Maternal Grandmother   . Hypertension Maternal Grandmother   . Varicose Veins Maternal Grandmother   . Arthritis Maternal Grandfather   . COPD Maternal Grandfather   . Diabetes Maternal Grandfather   . Hearing loss Maternal  Grandfather   . Hypertension Maternal Grandfather   . Arthritis Paternal Grandmother   . Diabetes Paternal Grandmother   . Hypertension Paternal Grandmother   . Arthritis Paternal Grandfather   . Diabetes Paternal Grandfather   . Hypertension Paternal Grandfather     Prior to Admission medications   Medication Sig Start Date End Date Taking? Authorizing Provider  glyBURIDE (DIABETA) 2.5 MG tablet Take 2.5 mg by mouth 2 (two) times daily.    Yes Historical Provider, MD  hydrocortisone cream 1 % Apply 1 application topically daily as needed (rash).   Yes Historical Provider, MD  Prenatal Vit-Fe Fumarate-FA (PRENATAL MULTIVITAMIN) TABS tablet Take 1 tablet by mouth at bedtime.   Yes Historical Provider, MD  valACYclovir (VALTREX) 500 MG tablet Take 500 mg by mouth at bedtime.    Yes Historical Provider, MD  labetalol (NORMODYNE) 200 MG tablet Take 2 tablets (400 mg total) by mouth 3 (three) times daily. 08/27/14   Donnel Saxon, CNM   Physical Exam: Blood pressure 170/85, pulse 101, temperature 97.4 F (36.3 C), temperature source Oral, resp. rate 20, height 5\' 2"  (1.575 m), weight 145.605 kg (321 lb), SpO2 94 %, unknown if currently breastfeeding. Filed Vitals:   09/13/14 1626  BP: 170/85  Pulse: 101  Temp:   Resp: 20     General:  Alert afebrile comfortable  Eyes: PERLA  Neck: NO LAD  Cardiovascular: s1s2. No murmers/ rubs/gallops. No JVD . Pedal edema 2+  Respiratory: diminished at bases, no wheezing or rhonchi  Abdomen: soft mild tenderness at the incision site.   Skin: no rash  Musculoskeletal: pedal edema 2+, no painful ROM of the extremties  Psychiatric: does not appear to be depressed or anxious  Neurologic: speech normal. Oriented to place, person and time.   Labs on Admission:  Basic Metabolic Panel:  Recent Labs Lab 09/10/14 0540 09/10/14 1208 09/10/14 1805 09/10/14 2357 09/11/14 0645 09/12/14 0630  NA 135  --  133* 136 134* 136  K 3.7  --  3.4*  3.6 3.3* 3.2*  CL 106  --  105 107 107 109  CO2 17*  --  21 22 21 20   GLUCOSE 197*  --  116* 101* 169* 88  BUN 11  --  15 19 19 16   CREATININE 0.83  --  1.16* 1.31* 1.06 0.65  CALCIUM 8.1*  --  7.9* 8.2* 7.4* 8.1*  MG 5.5* 6.8* 6.5* 6.2* 4.5*  --    Liver Function Tests:  Recent Labs Lab 09/10/14 0540 09/10/14 1805 09/10/14 2357 09/11/14 0645 09/12/14 0630  AST 18 15 20 16 20   ALT 12 11 11 10 11   ALKPHOS 94 97 108 90 83  BILITOT 0.4 0.1* 0.1* 0.3 0.2*  PROT 5.1* 5.8* 5.5* 4.9* 6.2  ALBUMIN 2.0* 2.2* 2.2* 1.8* 2.3*   No results for input(s): LIPASE, AMYLASE in the last 168 hours. No results for input(s): AMMONIA in the last 168 hours. CBC:  Recent Labs Lab 09/09/14 0622 09/09/14 1820 09/10/14 0540 09/10/14 2357 09/11/14 0645 09/12/14 0630  WBC 11.2* 13.1* 15.7* 15.3* 13.1* 13.2*  NEUTROABS 8.7*  --   --   --   --   --   HGB 10.6* 11.2* 10.6* 9.9* 9.1* 9.7*  HCT 31.2* 32.9* 31.9* 29.4* 26.1* 28.6*  MCV 84.6 84.8 87.4 86.2 85.9 86.4  PLT 273 292 302 294 238 278   Cardiac Enzymes: No results for input(s): CKTOTAL, CKMB, CKMBINDEX, TROPONINI in the last 168 hours. BNP: Invalid input(s): POCBNP CBG:  Recent Labs Lab 09/12/14 1154 09/13/14 0225 09/13/14 0504 09/13/14 1054 09/13/14 1321  GLUCAP 130* 107* 101* 103* 103*    Radiological Exams on Admission: No results found.  EKG Pending.   Time spent: 45 minutes  [facility] Pager [phone]  If 7PM-7AM, please contact night-coverage [email or web address] Password [facility] 09/13/2014, 5:48 PM

## 2014-09-13 NOTE — Progress Notes (Signed)
Pressure dressing found to be semi-saturated with foul smelling moisture. JP drain was found uncharged and open to free drain by patients abdomen area. Encouraged/assisted patient to shower and did assist in the removal of her partially adhered pressure dressing and open/damp honeycomb. Both of which had a awesome foul odor. Steri strips x 8 were wiped away from site. Cleaned gently with johnson and johnson for its aroma properties, rinsed and dried. Replaced steris and honeycomb

## 2014-09-13 NOTE — Lactation Note (Signed)
This note was copied from the chart of [place]. Lactation Consultation Note     Follow up consult with this mom of a 7 day old baby, now 25 4/7 weeks CGA, weight under 6 pounds. Mom had a DEP set up at admission, but has not been pumping. She said that due to her high blood pressure, she was told to rest. On exam, her breasts are very large, pendulous, left areola edematous. I was not able to express more that a tiny drop of colostrum from her right breast, and none from her left. I explained that if mom wants to provide eBm, she needs to pump every 3 hours, at lest 8 times a day. I did send a fax to [facility] for them to call mom, to see if they have a DEP available for her to get. I told mom that if she was eligible for a pump, and WIC did not have one, she could loan one for 12 days for 30$, and get that deposit back after 12 days.   Patient Name: Lauren Delgado [id number] Date: 09/13/2014 Reason for consult: Follow-up assessment   Maternal Data    Feeding    LATCH Score/Interventions                      Lactation Tools Discussed/Used     Consult Status Consult Status: PRN Follow-up type: Call as needed    Tonna Corner 09/13/2014, 9:34 AM

## 2014-09-14 DIAGNOSIS — I10 Essential (primary) hypertension: Secondary | ICD-10-CM

## 2014-09-14 LAB — GLUCOSE, CAPILLARY
Glucose-Capillary: 123 mg/dL — ABNORMAL HIGH (ref 70–99)
Glucose-Capillary: 119 mg/dL — ABNORMAL HIGH (ref 70–99)
Glucose-Capillary: 230 mg/dL — ABNORMAL HIGH (ref 70–99)

## 2014-09-14 LAB — BASIC METABOLIC PANEL
Anion gap: 8 (ref 5–15)
BUN: 9 mg/dL (ref 6–23)
CO2: 25 mmol/L (ref 19–32)
Calcium: 8.6 mg/dL (ref 8.4–10.5)
Chloride: 109 mEq/L (ref 96–112)
Creatinine, Ser: 0.75 mg/dL (ref 0.50–1.10)
GFR calc Af Amer: >90 mL/min (ref >90)
GFR calc non Af Amer: >90 mL/min (ref >90)
Glucose, Bld: 126 mg/dL — ABNORMAL HIGH (ref 70–99)
Potassium: 3.1 mmol/L — ABNORMAL LOW (ref 3.5–5.1)
Sodium: 142 mmol/L (ref 135–145)

## 2014-09-14 LAB — HEMOGLOBIN A1C
Hgb A1c MFr Bld: 7 % — ABNORMAL HIGH (ref <5.7)
Mean Plasma Glucose: 154 mg/dL — ABNORMAL HIGH (ref <117)

## 2014-09-14 LAB — MAGNESIUM: Magnesium: 1.4 mg/dL — ABNORMAL LOW (ref 1.5–2.5)

## 2014-09-14 MED ORDER — POTASSIUM CHLORIDE CRYS ER 20 MEQ PO TBCR
40.0000 meq | EXTENDED_RELEASE_TABLET | Freq: Two times a day (BID) | ORAL | Status: DC
  Filled 2014-09-14 (×2): qty 2

## 2014-09-14 MED ORDER — LABETALOL HCL 300 MG PO TABS: 600.0000 mg | ORAL_TABLET | Freq: Four times a day (QID) | ORAL | Status: DC

## 2014-09-14 MED ORDER — MEASLES, MUMPS & RUBELLA VAC ~~LOC~~ INJ
0.5000 mL | INJECTION | Freq: Once | SUBCUTANEOUS | Status: DC
  Filled 2014-09-14: qty 0.5

## 2014-09-14 MED ORDER — NIFEDIPINE ER 60 MG PO TB24: 60.0000 mg | ORAL_TABLET | Freq: Every day | ORAL | Status: DC

## 2014-09-14 MED ORDER — FERROUS SULFATE 325 (65 FE) MG PO TBEC: 325.0000 mg | DELAYED_RELEASE_TABLET | Freq: Two times a day (BID) | ORAL | Status: DC

## 2014-09-14 MED ORDER — POTASSIUM CHLORIDE CRYS ER 20 MEQ PO TBCR: 40.0000 meq | EXTENDED_RELEASE_TABLET | Freq: Two times a day (BID) | ORAL | Status: DC

## 2014-09-14 MED ORDER — OXYCODONE-ACETAMINOPHEN 5-325 MG PO TABS: 1.0000 | ORAL_TABLET | ORAL | Status: DC | PRN

## 2014-09-14 MED ORDER — FUROSEMIDE 10 MG/ML IJ SOLN: 20.0000 mg | Freq: Two times a day (BID) | INTRAMUSCULAR | Status: DC

## 2014-09-14 NOTE — Discharge Summary (Signed)
Cesarean Section Delivery Discharge Summary  Lauren Delgado  DOB:    [birth year] MRN:    [id number] CSN:    [id number]  Date of admission:                  09/07/14  Date of discharge:                   09/14/14  Procedures this admission:  Primary CS  Date of Delivery: 09/10/14  Newborn Data:  Live born  Information for the patient's newborn:  Lauren Delgado, Lauren Delgado [[id number]]  female  Live born female  Birth Weight: 6 lb 2.8 oz (2801 g) APGAR: 2, 8  Home with mother. Circumcision Plan: OP  History of Present Illness:  Lauren Delgado is a 29 y.o. female, G1P1001, who presents at 23w1dweeks gestation. [facility]    Her pregnancy has been complicated by:  Patient Active Problem List   Diagnosis Date Noted  . Status post primary low transverse cesarean section 09/11/2014  . Pre-eclampsia superimposed on chronic hypertension, antepartum 09/07/2014  . Pre-eclampsia added to pre-existing hypertension 08/27/2014  . Gestational hypertension 08/26/2014  . Positive GBS test 08/25/2014  . Chronic hypertension with exacerbation during pregnancy in third trimester 08/25/2014  . Rubella non-immune status, antepartum 08/25/2014  . Varicella non-immune 08/25/2014  . Type 2 diabetes mellitus affecting pregnancy in second trimester, antepartum 06/07/2014  . HYPOTHYROIDISM, BORDERLINE 12/19/2006  . Polycystic Ovaries 10/31/2006  . OBESITY--BMI 54.5 10/31/2006  . Essential Hypertension, Benign 10/31/2006    Hospital course: The patient was admitted for assessment for failed BBP.   Her postpartum course was complicated CHTN.  She was discharged to home on postpartum day 5 doing well.  Feeding: bottle  Contraception: no method Pt understands the risks are but not limited to irregular bleeding, formation of DVT, fluid fluctuations, elevation in blood pressure, stroke,  breast tenderness and liver damage.  She states she will report any serious side effects.  She has been given verbal and written instructions and voiced a clear understanding.    Discharge hemoglobin: HEMOGLOBIN  Date Value Ref Range Status  09/12/2014 9.7* 12.0 - 15.0 g/dL Final   HCT  Date Value Ref Range Status  09/12/2014 28.6* 36.0 - 46.0 % Final   Anemia - hemodynamicly stable.    PreNatal Labs ABO, Rh: --/--/B POS (01/05 2158)   Antibody: NEG (01/05 2158) Rubella:   non immune  MMR prior to DC RPR: NON REAC (01/05 2158)  HBsAg: Negative (07/02 0000)  HIV: Non-reactive (07/02 0000)  GBS: Positive (12/23 0000)  Discharge Physical Exam:  General: alert and cooperative Lochia: appropriate Uterine Fundus: firm Incision: healing well, no significant drainage, no dehiscence DVT Evaluation: No evidence of DVT seen on physical exam.  Intrapartum Procedures: cesarean: low cervical, transverse and GBS prophylaxis Postpartum Procedures: Rubella Ig prior to DC Complications-Operative and Postpartum: CHTN  Discharge Diagnoses: Term Pregnancy-delivered, Preelampsia and CHTN, DM  Discharge Information:  Activity:           pelvic rest Diet:                low sodium Medications: PNV, Colace, Iron, Percocet and lasix 273mbid, Kcl 4073mbid, labetalol 600m61mD, procardia 60mg54mglyuride 2.5mg b65mCondition:      improved   Newborn Data: Live born  Information for the patient's newborn:  Lauren  [place] Delgado [[id number]]  female   Home with mother.  Discharge to: home  Follow-up Information    Follow up with [facility]. Schedule an appointment as soon as possible for a visit in 2 weeks.   Specialty:  Obstetrics and Gynecology   Why:  For wound re-check   Contact information:   [address] [place] [address] [phone]      Follow up with [facility]. Schedule an  appointment as soon as possible for a visit in 6 weeks.   Specialty:  Obstetrics and Gynecology   Why:  Postpartum check up   Contact information:   [address] [place] [address] [phone]     Pt to Keep her appointment at [facility] center on Jan 19 at 10am Pt to monitor her CBG fasting and 2 hour PP.  Record all results and bring log to all appointments  Masey Scheiber, CNM, MSN  09/14/2014. 8:14 PM  Care After Cesarean Delivery  Refer to this sheet in the next few weeks. These instructions provide you with information on caring for yourself after your procedure. Your caregiver may also give you specific instructions. Your treatment has been planned according to current medical practices, but problems sometimes occur. Call your caregiver if you have any problems or questions after you go home. HOME CARE INSTRUCTIONS  Only take over-the-counter or prescription medicines as directed by your caregiver.  Do not drink alcohol, especially if you are breastfeeding or taking medicine to relieve pain.  Do not chew or smoke tobacco.  Continue to use good perineal care. Good perineal care includes:  Wiping your perineum from front to back.  Keeping your perineum clean.  Check your cut (incision) daily for increased redness, drainage, swelling, or separation of skin.  Clean your incision gently with soap and water every day, and then pat it dry. If your caregiver says it is okay, leave the incision uncovered. Use a bandage (dressing) if the incision is draining fluid or appears irritated. If the adhesive strips across the incision do not fall off within 7 days, carefully peel them off.  Hug a pillow when coughing or sneezing until your incision is healed. This helps to relieve pain.  Do not use tampons or douche until your caregiver says it is okay.  Shower, wash your hair, and take tub baths as directed by your  caregiver.  Wear a well-fitting bra that provides breast support.  Limit wearing support panties or control-top hose.  Drink enough fluids to keep your urine clear or pale yellow.  Eat high-fiber foods such as whole grain cereals and breads, brown rice, beans, and fresh fruits and vegetables every day. These foods may help prevent or relieve constipation.  Resume activities such as climbing stairs, driving, lifting, exercising, or traveling as directed by your caregiver.  Talk to your caregiver about resuming sexual activities. This is dependent upon your risk of infection, your rate of healing, and your comfort and desire to resume sexual activity.  Try to have someone help you with your household activities and your newborn for at least a few days after you leave the hospital.  Rest as much as possible. Try to rest or take a nap when your newborn is sleeping.  Increase your activities gradually.  Keep all of your scheduled postpartum appointments. It is very important to keep your scheduled follow-up appointments. At these appointments, your caregiver will  be checking to make sure that you are healing physically and emotionally. SEEK MEDICAL CARE IF:   You are passing large clots from your vagina. Save any clots to show your caregiver.  You have a foul smelling discharge from your vagina.  You have trouble urinating.  You are urinating frequently.  You have pain when you urinate.  You have a change in your bowel movements.  You have increasing redness, pain, or swelling near your incision.  You have pus draining from your incision.  Your incision is separating.  You have painful, hard, or reddened breasts.  You have a severe headache.  You have blurred vision or see spots.  You feel sad or depressed.  You have thoughts of hurting yourself or your newborn.  You have questions about your care, the care of your newborn, or medicines.  You are dizzy or  lightheaded.  You have a rash.  You have pain, redness, or swelling at the site of the removed intravenous access (IV) tube.  You have nausea or vomiting.  You stopped breastfeeding and have not had a menstrual period within 12 weeks of stopping.  You are not breastfeeding and have not had a menstrual period within 12 weeks of delivery.  You have a fever. SEEK IMMEDIATE MEDICAL CARE IF:  You have persistent pain.  You have chest pain.  You have shortness of breath.  You faint.  You have leg pain.  You have stomach pain.  Your vaginal bleeding saturates 2 or more sanitary pads in 1 hour. MAKE SURE YOU:   Understand these instructions.  Will watch your condition.  Will get help right away if you are not doing well or get worse. Document Released: 05/12/2002 Document Revised: 05/14/2012 Document Reviewed: 04/16/2012 [facility] Patient Information 2014 [place].   Postpartum Depression and Baby Blues  The postpartum period begins right after the birth of a baby. During this time, there is often a great amount of joy and excitement. It is also a time of considerable changes in the life of the parent(s). Regardless of how many times a mother gives birth, each child brings new challenges and dynamics to the family. It is not unusual to have feelings of excitement accompanied by confusing shifts in moods, emotions, and thoughts. All mothers are at risk of developing postpartum depression or the "baby blues." These mood changes can occur right after giving birth, or they may occur many months after giving birth. The baby blues or postpartum depression can be mild or severe. Additionally, postpartum depression can resolve rather quickly, or it can be a long-term condition. CAUSES Elevated hormones and their rapid decline are thought to be a main cause of postpartum depression and the baby blues. There are a number of hormones that radically change during and after pregnancy.  Estrogen and progesterone usually decrease immediately after delivering your baby. The level of thyroid hormone and various cortisol steroids also rapidly drop. Other factors that play a major role in these changes include major life events and genetics.  RISK FACTORS If you have any of the following risks for the baby blues or postpartum depression, know what symptoms to watch out for during the postpartum period. Risk factors that may increase the likelihood of getting the baby blues or postpartum depression include: 1. Havinga personal or family history of depression. 2. Having depression while being pregnant. 3. Having premenstrual or oral contraceptive-associated mood issues. 4. Having exceptional life stress. 5. Having marital conflict. 6. Lacking a social  support network. 7. Having a baby with special needs. 8. Having health problems such as diabetes. SYMPTOMS Baby blues symptoms include:  Brief fluctuations in mood, such as going from extreme happiness to sadness.  Decreased concentration.  Difficulty sleeping.  Crying spells, tearfulness.  Irritability.  Anxiety. Postpartum depression symptoms typically begin within the first month after giving birth. These symptoms include:  Difficulty sleeping or excessive sleepiness.  Marked weight loss.  Agitation.  Feelings of worthlessness.  Lack of interest in activity or food. Postpartum psychosis is a very concerning condition and can be dangerous. Fortunately, it is rare. Displaying any of the following symptoms is cause for immediate medical attention. Postpartum psychosis symptoms include:  Hallucinations and delusions.  Bizarre or disorganized behavior.  Confusion or disorientation. DIAGNOSIS  A diagnosis is made by an evaluation of your symptoms. There are no medical or lab tests that lead to a diagnosis, but there are various questionnaires that a caregiver may use to identify those with the baby blues, postpartum  depression, or psychosis. Often times, a screening tool called the [place] Postnatal Depression Scale is used to diagnose depression in the postpartum period.  TREATMENT The baby blues usually goes away on its own in 1 to 2 weeks. Social support is often all that is needed. You should be encouraged to get adequate sleep and rest. Occasionally, you may be given medicines to help you sleep.  Postpartum depression requires treatment as it can last several months or longer if it is not treated. Treatment may include individual or group therapy, medicine, or both to address any social, physiological, and psychological factors that may play a role in the depression. Regular exercise, a healthy diet, rest, and social support may also be strongly recommended.  Postpartum psychosis is more serious and needs treatment right away. Hospitalization is often needed. HOME CARE INSTRUCTIONS  Get as much rest as you can. Nap when the baby sleeps.  Exercise regularly. Some women find yoga and walking to be beneficial.  Eat a balanced and nourishing diet.  Do little things that you enjoy. Have a cup of tea, take a bubble bath, read your favorite magazine, or listen to your favorite music.  Avoid alcohol.  Ask for help with household chores, cooking, grocery shopping, or running errands as needed. Do not try to do everything.  Talk to people close to you about how you are feeling. Get support from your partner, family members, friends, or other new moms.  Try to stay positive in how you think. Think about the things you are grateful for.  Do not spend a lot of time alone.  Only take medicine as directed by your caregiver.  Keep all your postpartum appointments.  Let your caregiver know if you have any concerns. SEEK MEDICAL CARE IF: You are having a reaction or problems with your medicine. SEEK IMMEDIATE MEDICAL CARE IF:  You have suicidal feelings.  You feel you may harm the baby or someone  else. Document Released: 05/24/2004 Document Revised: 11/12/2011 Document Reviewed: 06/26/2011 [facility] Patient Information 2014 [place], [place].   Breastfeeding Deciding to breastfeed is one of the best choices you can make for you and your baby. A change in hormones during pregnancy causes your breast tissue to grow and increases the number and size of your milk ducts. These hormones also allow proteins, sugars, and fats from your blood supply to make breast milk in your milk-producing glands. Hormones prevent breast milk from being released before your baby is born as  well as prompt milk flow after birth. Once breastfeeding has begun, thoughts of your baby, as well as his or her sucking or crying, can stimulate the release of milk from your milk-producing glands.  BENEFITS OF BREASTFEEDING For Your Baby  Your first milk (colostrum) helps your baby's digestive system function better.   There are antibodies in your milk that help your baby fight off infections.   Your baby has a lower incidence of asthma, allergies, and sudden infant death syndrome.   The nutrients in breast milk are better for your baby than infant formulas and are designed uniquely for your baby's needs.   Breast milk improves your baby's brain development.   Your baby is less likely to develop other conditions, such as childhood obesity, asthma, or type 2 diabetes mellitus.  For You   Breastfeeding helps to create a very special bond between you and your baby.   Breastfeeding is convenient. Breast milk is always available at the correct temperature and costs nothing.   Breastfeeding helps to burn calories and helps you lose the weight gained during pregnancy.   Breastfeeding makes your uterus contract to its prepregnancy size faster and slows bleeding (lochia) after you give birth.   Breastfeeding helps to lower your risk of developing type 2 diabetes mellitus, osteoporosis, and breast or ovarian cancer  later in life. SIGNS THAT YOUR BABY IS HUNGRY Early Signs of Hunger  Increased alertness or activity.  Stretching.  Movement of the head from side to side.  Movement of the head and opening of the mouth when the corner of the mouth or cheek is stroked (rooting).  Increased sucking sounds, smacking lips, cooing, sighing, or squeaking.  Hand-to-mouth movements.  Increased sucking of fingers or hands. Late Signs of Hunger  Fussing.  Intermittent crying. Extreme Signs of Hunger Signs of extreme hunger will require calming and consoling before your baby will be able to breastfeed successfully. Do not wait for the following signs of extreme hunger to occur before you initiate breastfeeding:   Restlessness.  A loud, strong cry.   Screaming.  BREASTFEEDING BASICS Breastfeeding Initiation  Find a comfortable place to sit or lie down, with your neck and back well supported.  Place a pillow or rolled up blanket under your baby to bring him or her to the level of your breast (if you are seated). Nursing pillows are specially designed to help support your arms and your baby while you breastfeed.  Make sure that your baby's abdomen is facing your abdomen.   Gently massage your breast. With your fingertips, massage from your chest wall toward your nipple in a circular motion. This encourages milk flow. You may need to continue this action during the feeding if your milk flows slowly.  Support your breast with 4 fingers underneath and your thumb above your nipple. Make sure your fingers are well away from your nipple and your baby's mouth.   Stroke your baby's lips gently with your finger or nipple.   When your baby's mouth is open wide enough, quickly bring your baby to your breast, placing your entire nipple and as much of the colored area around your nipple (areola) as possible into your baby's mouth.   More areola should be visible above your baby's upper lip than below the  lower lip.   Your baby's tongue should be between his or her lower gum and your breast.   Ensure that your baby's mouth is correctly positioned around your nipple (latched). Your baby's  lips should create a seal on your breast and be turned out (everted).  It is common for your baby to suck about 2-3 minutes in order to start the flow of breast milk. Latching Teaching your baby how to latch on to your breast properly is very important. An improper latch can cause nipple pain and decreased milk supply for you and poor weight gain in your baby. Also, if your baby is not latched onto your nipple properly, he or she may swallow some air during feeding. This can make your baby fussy. Burping your baby when you switch breasts during the feeding can help to get rid of the air. However, teaching your baby to latch on properly is still the best way to prevent fussiness from swallowing air while breastfeeding. Signs that your baby has successfully latched on to your nipple:    Silent tugging or silent sucking, without causing you pain.   Swallowing heard between every 3-4 sucks.    Muscle movement above and in front of his or her ears while sucking.  Signs that your baby has not successfully latched on to nipple:   Sucking sounds or smacking sounds from your baby while breastfeeding.  Nipple pain. If you think your baby has not latched on correctly, slip your finger into the corner of your baby's mouth to break the suction and place it between your baby's gums. Attempt breastfeeding initiation again. Signs of Successful Breastfeeding Signs from your baby:   A gradual decrease in the number of sucks or complete cessation of sucking.   Falling asleep.   Relaxation of his or her body.   Retention of a small amount of milk in his or her mouth.   Letting go of your breast by himself or herself. Signs from you:  Breasts that have increased in firmness, weight, and size 1-3 hours after  feeding.   Breasts that are softer immediately after breastfeeding.  Increased milk volume, as well as a change in milk consistency and color by the fifth day of breastfeeding.   Nipples that are not sore, cracked, or bleeding. Signs That Your [place] Books is Getting Enough Milk  Wetting at least 3 diapers in a 24-hour period. The urine should be clear and pale yellow by age 30 days.  At least 3 stools in a 24-hour period by age 30 days. The stool should be soft and yellow.  At least 3 stools in a 24-hour period by age 52 days. The stool should be seedy and yellow.  No loss of weight greater than 10% of birth weight during the first 5 days of age.  Average weight gain of 4-7 ounces (113-198 g) per week after age 26 days.  Consistent daily weight gain by age 26 days, without weight loss after the age of 2 weeks. After a feeding, your baby may spit up a small amount. This is common. BREASTFEEDING FREQUENCY AND DURATION Frequent feeding will help you make more milk and can prevent sore nipples and breast engorgement. Breastfeed when you feel the need to reduce the fullness of your breasts or when your baby shows signs of hunger. This is called "breastfeeding on demand." Avoid introducing a pacifier to your baby while you are working to establish breastfeeding (the first 4-6 weeks after your baby is born). After this time you may choose to use a pacifier. Research has shown that pacifier use during the first year of a baby's life decreases the risk of sudden infant death syndrome (SIDS). Allow your  baby to feed on each breast as long as he or she wants. Breastfeed until your baby is finished feeding. When your baby unlatches or falls asleep while feeding from the first breast, offer the second breast. Because newborns are often sleepy in the first few weeks of life, you may need to awaken your baby to get him or her to feed. Breastfeeding times will vary from baby to baby. However, the following rules can  serve as a guide to help you ensure that your baby is properly fed:  Newborns (babies 62 weeks of age or younger) may breastfeed every 1-3 hours.  Newborns should not go longer than 3 hours during the day or 5 hours during the night without breastfeeding.  You should breastfeed your baby a minimum of 8 times in a 24-hour period until you begin to introduce solid foods to your baby at around 11 months of age. BREAST MILK PUMPING Pumping and storing breast milk allows you to ensure that your baby is exclusively fed your breast milk, even at times when you are unable to breastfeed. This is especially important if you are going back to work while you are still breastfeeding or when you are not able to be present during feedings. Your lactation consultant can give you guidelines on how long it is safe to store breast milk.  A breast pump is a machine that allows you to pump milk from your breast into a sterile bottle. The pumped breast milk can then be stored in a refrigerator or freezer. Some breast pumps are operated by hand, while others use electricity. Ask your lactation consultant which type will work best for you. Breast pumps can be purchased, but some hospitals and breastfeeding support groups lease breast pumps on a monthly basis. A lactation consultant can teach you how to hand express breast milk, if you prefer not to use a pump.  CARING FOR YOUR BREASTS WHILE YOU BREASTFEED Nipples can become dry, cracked, and sore while breastfeeding. The following recommendations can help keep your breasts moisturized and healthy:  Avoid using soap on your nipples.   Wear a supportive bra. Although not required, special nursing bras and tank tops are designed to allow access to your breasts for breastfeeding without taking off your entire bra or top. Avoid wearing underwire-style bras or extremely tight bras.  Air dry your nipples for 3-46mnutes after each feeding.   Use only cotton bra pads to absorb  leaked breast milk. Leaking of breast milk between feedings is normal.   Use lanolin on your nipples after breastfeeding. Lanolin helps to maintain your skin's normal moisture barrier. If you use pure lanolin, you do not need to wash it off before feeding your baby again. Pure lanolin is not toxic to your baby. You may also hand express a few drops of breast milk and gently massage that milk into your nipples and allow the milk to air dry. In the first few weeks after giving birth, some women experience extremely full breasts (engorgement). Engorgement can make your breasts feel heavy, warm, and tender to the touch. Engorgement peaks within 3-5 days after you give birth. The following recommendations can help ease engorgement:  Completely empty your breasts while breastfeeding or pumping. You may want to start by applying warm, moist heat (in the shower or with warm water-soaked hand towels) just before feeding or pumping. This increases circulation and helps the milk flow. If your baby does not completely empty your breasts while breastfeeding, pump any extra  milk after he or she is finished.  Wear a snug bra (nursing or regular) or tank top for 1-2 days to signal your body to slightly decrease milk production.  Apply ice packs to your breasts, unless this is too uncomfortable for you.  Make sure that your baby is latched on and positioned properly while breastfeeding. If engorgement persists after 48 hours of following these recommendations, contact your health care provider or a Science writer. OVERALL HEALTH CARE RECOMMENDATIONS WHILE BREASTFEEDING  Eat healthy foods. Alternate between meals and snacks, eating 3 of each per day. Because what you eat affects your breast milk, some of the foods may make your baby more irritable than usual. Avoid eating these foods if you are sure that they are negatively affecting your baby.  Drink milk, fruit juice, and water to satisfy your thirst (about  10 glasses a day).   Rest often, relax, and continue to take your prenatal vitamins to prevent fatigue, stress, and anemia.  Continue breast self-awareness checks.  Avoid chewing and smoking tobacco.  Avoid alcohol and drug use. Some medicines that may be harmful to your baby can pass through breast milk. It is important to ask your health care provider before taking any medicine, including all over-the-counter and prescription medicine as well as vitamin and herbal supplements. It is possible to become pregnant while breastfeeding. If birth control is desired, ask your health care provider about options that will be safe for your baby. SEEK MEDICAL CARE IF:   You feel like you want to stop breastfeeding or have become frustrated with breastfeeding.  You have painful breasts or nipples.  Your nipples are cracked or bleeding.  Your breasts are red, tender, or warm.  You have a swollen area on either breast.  You have a fever or chills.  You have nausea or vomiting.  You have drainage other than breast milk from your nipples.  Your breasts do not become full before feedings by the fifth day after you give birth.  You feel sad and depressed.  Your baby is too sleepy to eat well.  Your baby is having trouble sleeping.   Your baby is wetting less than 3 diapers in a 24-hour period.  Your baby has less than 3 stools in a 24-hour period.  Your baby's skin or the white part of his or her eyes becomes yellow.   Your baby is not gaining weight by 62 days of age. SEEK IMMEDIATE MEDICAL CARE IF:   Your baby is overly tired (lethargic) and does not want to wake up and feed.  Your baby develops an unexplained fever. Document Released: 08/20/2005 Document Revised: 08/25/2013 Document Reviewed: 02/11/2013 [facility] Patient Information 2015 [place], [place]. This information is not intended to replace advice given to you by your health care provider. Make sure you discuss any  questions you have with your health care provider.

## 2014-09-14 NOTE — Progress Notes (Signed)
Subjective: Postpartum Day 5: Cesarean Delivery due to NRFHT Patient up ad lib, reports no syncope or dizziness. Feeding:  bottle Contraceptive plan:  none  Objective: Vital signs in last 24 hours: Temp:  [97.4 F (36.3 C)-98.4 F (36.9 C)] 98 F (36.7 C) (01/12 0540) Pulse Rate:  [85-105] 98 (01/12 1159) Resp:  [20] 20 (01/11 1626) BP: (130-192)/(69-111) 141/82 mmHg (01/12 1159)  Physical Exam:  General: alert and cooperative Lochia: appropriate Uterine Fundus: firm Abdomen:  + bowel sounds, non distended Incision: healing well, no significant drainage, no dehiscence  Honeycomb dressing CDI DVT Evaluation: No evidence of DVT seen on physical exam. Homan's sign: Negative   Recent Labs  09/12/14 0630  HGB 9.7*  HCT 28.6*  WBC 13.2*     Assessment: Status post Cesarean section day 5. Doing well postoperatively.  Honeycomb dressing in place, no significant drainage JP drain in place, no significant drainage ~ 25 cc last 24 hours by verbal report Anemia - hemodynamicly stable.  Declines transfusion  Plan: Continue current care. Discharge home Remove JP drain DC NSAIDS and HCT   Lauren Delgado, CNM, MSN 09/14/2014. 12:24 PM

## 2014-09-14 NOTE — Progress Notes (Signed)
Notified Venus Standard of continued elevated blood pressures after 10mg  and then 20mg  IV apresoline. CNM ordered to give 1200 labetalol now and recheck blood pressure in about 45 minutes. Will continue to monitor. Maxwell Caul, Leretha Dykes [place]

## 2014-09-14 NOTE — Progress Notes (Signed)
Pt discharged to home as ordered, all verbal and written discharge instructions given- pt verbalizes understanding. Pt discharged in stable condition and without incident. Accompanied by family and infant.

## 2014-09-14 NOTE — Progress Notes (Signed)
BP 158/84 mmHg  Pulse 95  Temp(Src) 98 F (36.7 C) (Oral)  Resp 20  Ht _0  (1.575 m)  Wt 145.605 kg (321 lb)  BMI 58.70 kg/m2  SpO2 94%  Breastfeeding? Unknown - Cont lasix 20 mg BID orally, with KCL supplementation 40 mEq BID. - Her BP seems to be improving on current therapy. Labetalol, lasix and procardia. - There is no need to make her Bp 120/80 in less than 1 week. - She will need follow up with PCP in 1-2 week, check a b-met in 1 week and titrate antihypertensive medications as tolerate it. - I agree with Dr. Petra Kuba note that if needed can go up on the procardia. I do not recommend  titrating more than a single medication at a time (this can be made every week as needed), as some antihypertensive can take anywhere from 3 days to 2 weeks to see its full effect.

## 2014-09-21 ENCOUNTER — Ambulatory Visit: Payer: Medicaid Other | Admitting: Family Medicine

## 2015-02-28 ENCOUNTER — Other Ambulatory Visit: Payer: Self-pay

## 2015-04-04 ENCOUNTER — Encounter: Payer: Self-pay | Admitting: Student

## 2015-04-04 ENCOUNTER — Ambulatory Visit (INDEPENDENT_AMBULATORY_CARE_PROVIDER_SITE_OTHER): Payer: Medicaid Other | Admitting: Student

## 2015-04-04 DIAGNOSIS — B009 Herpesviral infection, unspecified: Secondary | ICD-10-CM

## 2015-04-04 DIAGNOSIS — E039 Hypothyroidism, unspecified: Secondary | ICD-10-CM | POA: Diagnosis not present

## 2015-04-04 DIAGNOSIS — I10 Essential (primary) hypertension: Secondary | ICD-10-CM

## 2015-04-04 DIAGNOSIS — E119 Type 2 diabetes mellitus without complications: Secondary | ICD-10-CM | POA: Insufficient documentation

## 2015-04-04 HISTORY — DX: Herpesviral infection, unspecified: B00.9

## 2015-04-04 LAB — LIPID PANEL
Cholesterol: 189 mg/dL (ref 125–200)
HDL: 46 mg/dL (ref >=46)
LDL Cholesterol: 121 mg/dL (ref <130)
Total CHOL/HDL Ratio: 4.1 Ratio (ref <=5.0)
Triglycerides: 109 mg/dL (ref <150)
VLDL: 22 mg/dL (ref <30)

## 2015-04-04 LAB — BASIC METABOLIC PANEL
BUN: 22 mg/dL (ref 7–25)
CO2: 20 mmol/L (ref 20–31)
Calcium: 9.5 mg/dL (ref 8.6–10.2)
Chloride: 99 mmol/L (ref 98–110)
Creat: 0.73 mg/dL (ref 0.50–1.10)
Glucose, Bld: 397 mg/dL — ABNORMAL HIGH (ref 65–99)
Potassium: 4.6 mmol/L (ref 3.5–5.3)
Sodium: 135 mmol/L (ref 135–146)

## 2015-04-04 LAB — TSH: TSH: 2.904 u[IU]/mL (ref 0.350–4.500)

## 2015-04-04 LAB — POCT GLYCOSYLATED HEMOGLOBIN (HGB A1C): Hemoglobin A1C: 13.6

## 2015-04-04 MED ORDER — HYDROCHLOROTHIAZIDE 12.5 MG PO TABS: 12.5000 mg | ORAL_TABLET | Freq: Every day | ORAL | Status: DC

## 2015-04-04 MED ORDER — METFORMIN HCL 500 MG PO TABS: 500.0000 mg | ORAL_TABLET | Freq: Two times a day (BID) | ORAL | Status: DC

## 2015-04-04 MED ORDER — VALACYCLOVIR HCL 500 MG PO TABS: 500.0000 mg | ORAL_TABLET | ORAL | Status: DC

## 2015-04-04 NOTE — Assessment & Plan Note (Addendum)
Good energy. No report of fatigue. TSH 4.57 six months ago. She was also pregnant at that time. - Will check TSH today

## 2015-04-04 NOTE — Progress Notes (Signed)
   Subjective:    Patient ID: Lauren Delgado, female    DOB: [birth year], 29 y.o.   MRN: [id number]  HPI Swelling on left foot:  since 2012. Has gotten bigger but stable over the last one year. Sometimes painful & red when it gets irritated from shoes rubbing against it. The pain and reddness resolves on its own. Patient stands most of the days at work. She works as Research officer, trade union at [employer]. Denies joint pain.  DM-2: polyuria, occasional nocturnal micturition, no polydipsia, no dry mouth, no vision problem, no numbness in legs. No dysuria, no urine color changes. Has been to her ophthalmologist 06/2014. Some mention of increased pressure in her eye. She is planning to go back and see her opthalmologist in 06/2015.  HTN: BP 162/112 today. Had high blood pressure in march 2016 at [employer]. Took labetalol until  March this year. No headache or chest pain  Hypothyroidism: good energy level, works and takes care of her baby and home.  Pap smear: reports having a normal one in 03/2014  Mood: "all right". Appropriate affect  Review of Systems per HPI    Objective:   Physical Exam  Gen: Obese, NAD. Alert HEENT: Atraumatic, PERRLA, Oropharynx clear. MMM CV: RRR. No murmurs, rubs, or gallops noted. 2+ radial and DP pulses bilaterally. Resp: CTAB. No wheezing, crackles, or rhonchi noted. Abd: +BS. Soft, non-distended, non-tender. No rebound or guarding.  Ext: No edema. No gross deformities. Circular swelling about an inch in diameter below her left medial malleolus, feels soft, nontender. Neuro: Alert and oriented, No gross focal deficits    Assessment & Plan:

## 2015-04-04 NOTE — Assessment & Plan Note (Addendum)
On glyburide and metformin in the past. Hasn't been on any medication recently. Last A1c 7 about 7 months ago. Endorses polyuria but denies polydipsia, polyphagia, dry mouth, vision changes or numbness.  - Will check A1c again - Will start Metformin 500 mg bid - discussed lifestyle changes (diet and activity briefly). Will go over this again given her high BMI - Last eye exam 06/2014 per patient's report  8/02/20216: A1c 13.6 FBG 397. Wrote a letter to a patient through My chart about these results and plan: -Increase metformin to 1000 mg bid -start glyburide 2.5 mg bid -follow up in 4 weeks to discuss these and further plans.

## 2015-04-04 NOTE — Assessment & Plan Note (Signed)
Denies active lesion. Refilled her Valtrex today.

## 2015-04-04 NOTE — Assessment & Plan Note (Addendum)
BP 162/112 in the office today. Less likely to be white coat hypertension given reports of elevated BP in the past. No hx of headache or chest pain. Also tachycardic to 100's.  - gave HCTZ 12.5 mg daily  - Will assess response and tolerance of med in  4 weeks.

## 2015-04-04 NOTE — Assessment & Plan Note (Addendum)
Morbidly obese. Discussed about lifestyle change needs. Will discuss this again during subsequent visits -Obtain lipid panel today given her risk factors such as DM & HTN

## 2015-04-04 NOTE — Assessment & Plan Note (Signed)
Denies active lesion. Refilled her valtrex today.

## 2015-04-04 NOTE — Patient Instructions (Signed)
It is nice meeting you today! We have discussed about the following issues. Swelling on your ankle which is likely lipoma,  medical term for fat tissue. We will continue to monitor that. Diabetes: started you on metformin. Checking blood. Hypertension: started you on hydrochlorothiazide. Checking on your blood. Low thyroid level: checking your blood today. Obesity: checking lipid/cholestrol Discussed about life style changes Refilled your other medications.  I will call you if any of the above labs come back abnormal. Otherwise, I will see you in 4 weeks for follow up.

## 2015-04-05 ENCOUNTER — Encounter: Payer: Self-pay | Admitting: Student

## 2015-04-05 MED ORDER — METFORMIN HCL 1000 MG PO TABS: 1000.0000 mg | ORAL_TABLET | Freq: Two times a day (BID) | ORAL | Status: DC

## 2015-04-05 MED ORDER — GLYBURIDE 2.5 MG PO TABS: 2.5000 mg | ORAL_TABLET | Freq: Two times a day (BID) | ORAL | Status: DC

## 2015-04-05 NOTE — Addendum Note (Signed)
Addended by: Wendee Beavers T on: 04/05/2015 11:00 AM   Modules accepted: Orders

## 2015-04-13 ENCOUNTER — Encounter: Payer: Self-pay | Admitting: Student

## 2015-05-02 ENCOUNTER — Ambulatory Visit: Payer: Medicaid Other | Admitting: Student

## 2015-06-23 ENCOUNTER — Ambulatory Visit: Payer: Medicaid Other | Admitting: Student

## 2015-07-04 ENCOUNTER — Ambulatory Visit (INDEPENDENT_AMBULATORY_CARE_PROVIDER_SITE_OTHER): Payer: Medicaid Other | Admitting: Student

## 2015-07-04 DIAGNOSIS — E119 Type 2 diabetes mellitus without complications: Secondary | ICD-10-CM | POA: Diagnosis present

## 2015-07-05 ENCOUNTER — Encounter: Payer: Self-pay | Admitting: Student

## 2015-07-05 NOTE — Progress Notes (Signed)
Patient checked in late. She rescheduled her appointment when told to wait. A1c ordered but wasn't drawn.

## 2015-07-14 ENCOUNTER — Ambulatory Visit: Payer: Medicaid Other | Admitting: Student

## 2015-07-26 ENCOUNTER — Ambulatory Visit: Payer: Medicaid Other | Admitting: Student

## 2015-07-26 ENCOUNTER — Other Ambulatory Visit: Payer: Self-pay | Admitting: Student

## 2015-07-26 DIAGNOSIS — E119 Type 2 diabetes mellitus without complications: Secondary | ICD-10-CM

## 2015-09-18 ENCOUNTER — Other Ambulatory Visit: Payer: Self-pay | Admitting: Student

## 2015-10-08 ENCOUNTER — Encounter: Payer: Self-pay | Admitting: Emergency Medicine

## 2015-10-08 ENCOUNTER — Emergency Department
Admission: EM | Admit: 2015-10-08 | Discharge: 2015-10-08 | Disposition: A | Discharge status: Home / Self Care | Payer: Medicaid Other | Attending: Emergency Medicine | Admitting: Emergency Medicine

## 2015-10-08 ENCOUNTER — Emergency Department: Payer: Medicaid Other

## 2015-10-08 DIAGNOSIS — M79672 Pain in left foot: Secondary | ICD-10-CM | POA: Diagnosis present

## 2015-10-08 DIAGNOSIS — R0600 Dyspnea, unspecified: Secondary | ICD-10-CM | POA: Diagnosis not present

## 2015-10-08 DIAGNOSIS — Z7984 Long term (current) use of oral hypoglycemic drugs: Secondary | ICD-10-CM | POA: Insufficient documentation

## 2015-10-08 DIAGNOSIS — I10 Essential (primary) hypertension: Secondary | ICD-10-CM | POA: Insufficient documentation

## 2015-10-08 DIAGNOSIS — Z79899 Other long term (current) drug therapy: Secondary | ICD-10-CM | POA: Insufficient documentation

## 2015-10-08 DIAGNOSIS — D2122 Benign neoplasm of connective and other soft tissue of left lower limb, including hip: Secondary | ICD-10-CM | POA: Diagnosis not present

## 2015-10-08 DIAGNOSIS — D492 Neoplasm of unspecified behavior of bone, soft tissue, and skin: Secondary | ICD-10-CM

## 2015-10-08 MED ORDER — TRAMADOL HCL 50 MG PO TABS: 50.0000 mg | ORAL_TABLET | Freq: Four times a day (QID) | ORAL | Status: DC | PRN

## 2015-10-08 MED ORDER — IBUPROFEN 800 MG PO TABS: 800.0000 mg | ORAL_TABLET | Freq: Once | ORAL | Status: DC

## 2015-10-08 MED ORDER — TRAMADOL HCL 50 MG PO TABS: 50.0000 mg | ORAL_TABLET | Freq: Once | ORAL | Status: DC

## 2015-10-08 MED ORDER — TRAMADOL HCL 50 MG PO TABS
50.0000 mg | ORAL_TABLET | Freq: Once | ORAL | Status: AC
  Administered 2015-10-08: 50 mg via ORAL
  Filled 2015-10-08: qty 1

## 2015-10-08 MED ORDER — ACETAMINOPHEN 500 MG PO TABS
1000.0000 mg | ORAL_TABLET | Freq: Once | ORAL | Status: AC
  Administered 2015-10-08: 1000 mg via ORAL
  Filled 2015-10-08: qty 2

## 2015-10-08 NOTE — ED Provider Notes (Signed)
CSN: [id number]     Arrival date & time 10/08/15  1626 History   First MD Initiated Contact with Patient 10/08/15 1652     Chief Complaint  Patient presents with  . Foot Pain     (Consider location/radiation/quality/duration/timing/severity/associated sxs/prior Treatment) HPI 30 year old female presents for evaluation of left foot soft tissue swelling. Soft tissue swelling is present for years but over the last few hours has become quite painful. His ulnar aspect of her left foot. She denies any warmth erythema or drainage. No trauma or injury. Pain is increased with weightbearing. Pain is moderate.    Past Medical History  Diagnosis Date  . Gestational diabetes mellitus, antepartum 2015  . Hypertension    Past Surgical History  Procedure Laterality Date  . No past surgeries    . Cesarean section N/A 09/09/2014    Procedure: CESAREAN SECTION;  Surgeon: Delice Lesch, MD;  Location: [facility];  Service: Obstetrics;  Laterality: N/A;   Family History  Problem Relation Age of Onset  . Arthritis Mother   . Depression Mother   . Hypertension Mother   . Miscarriages / [place] Mother   . Arthritis Father   . Hypertension Father   . Vision loss Father   . Asthma Brother   . Depression Brother   . Hypertension Brother   . Learning disabilities Brother   . Cancer Maternal Aunt   . COPD Maternal Aunt   . Hypertension Maternal Aunt   . Miscarriages / Stillbirths Maternal Aunt   . Heart disease Maternal Uncle   . Hypertension Maternal Uncle   . Learning disabilities Maternal Uncle   . Mental retardation Maternal Uncle   . Hypertension Paternal Aunt   . Hypertension Paternal Uncle   . Learning disabilities Paternal Uncle   . Mental retardation Paternal Uncle   . Arthritis Maternal Grandmother   . Diabetes Maternal Grandmother   . Stroke Maternal Grandmother   . Hypertension Maternal Grandmother   . Varicose Veins Maternal Grandmother   . Arthritis Maternal Grandfather   .  COPD Maternal Grandfather   . Diabetes Maternal Grandfather   . Hearing loss Maternal Grandfather   . Hypertension Maternal Grandfather   . Arthritis Paternal Grandmother   . Diabetes Paternal Grandmother   . Hypertension Paternal Grandmother   . Arthritis Paternal Grandfather   . Diabetes Paternal Grandfather   . Hypertension Paternal Grandfather   . Asthma Mother    Social History  Substance Use Topics  . Smoking status: Never Smoker   . Smokeless tobacco: Never Used  . Alcohol Use: No   OB History    Gravida Para Term Preterm AB TAB SAB Ectopic Multiple Living   1 1 1       0 1     Review of Systems  Constitutional: Negative for fever, chills, activity change and fatigue.  HENT: Negative for congestion, sinus pressure and sore throat.   Eyes: Negative for visual disturbance.  Respiratory: Negative for cough, chest tightness and shortness of breath.   Cardiovascular: Negative for chest pain and leg swelling.  Gastrointestinal: Negative for nausea, vomiting, abdominal pain and diarrhea.  Genitourinary: Negative for dysuria.  Musculoskeletal: Negative for arthralgias and gait problem.  Skin: Positive for wound (ontraumatic soft tissue swelling chronic left inner aspect of the foot). Negative for rash.  Neurological: Negative for weakness, numbness and headaches.  Hematological: Negative for adenopathy.  Psychiatric/Behavioral: Negative for behavioral problems, confusion and agitation.      Allergies  Review of  patient's allergies indicates no known allergies.  Home Medications   Prior to Admission medications   Medication Sig Start Date End Date Taking? Authorizing Provider  ferrous sulfate 325 (65 FE) MG EC tablet Take 1 tablet (325 mg total) by mouth 2 (two) times daily. 09/14/14 09/14/15  Venus Standard, CNM  glyBURIDE (DIABETA) 2.5 MG tablet TAKE 1 TABLET (2.5 MG TOTAL) BY MOUTH 2 (TWO) TIMES DAILY WITH A MEAL. 07/26/15   Mercy Riding, MD  hydrochlorothiazide  (HYDRODIURIL) 12.5 MG tablet Take 1 tablet (12.5 mg total) by mouth daily. 04/04/15   Mercy Riding, MD  metFORMIN (GLUCOPHAGE) 1000 MG tablet Take 1 tablet (1,000 mg total) by mouth 2 (two) times daily with a meal. 04/05/15   Mercy Riding, MD  traMADol (ULTRAM) 50 MG tablet Take 1 tablet (50 mg total) by mouth every 6 (six) hours as needed. 10/08/15   Duanne Guess, PA-C  valACYclovir (VALTREX) 500 MG tablet TAKE 1 TABLET (500 MG TOTAL) BY MOUTH 1 DAY OR 1 DOSE. 09/19/15   Mercy Riding, MD   BP 145/90 mmHg  Pulse 93  Temp(Src) 98.3 F (36.8 C)  Resp 18  Ht 5\' 2"  (1.575 m)  Wt 136.079 kg  BMI 54.86 kg/m2  SpO2 97%  LMP 09/10/2015 (Approximate) Physical Exam  Constitutional: She is oriented to person, place, and time. She appears well-developed and well-nourished. No distress.  HENT:  Head: Normocephalic and atraumatic.  Mouth/Throat: Oropharynx is clear and moist.  Eyes: EOM are normal. Pupils are equal, round, and reactive to light. Right eye exhibits no discharge. Left eye exhibits no discharge.  Neck: Normal range of motion. Neck supple.  Cardiovascular: Normal rate, regular rhythm and intact distal pulses.   Pulmonary/Chest: She is in respiratory distress.  Musculoskeletal: She exhibits no edema.  Examination of the left foot shows the patient has mild soft tissue swelling along the medial aspect of the ankle. There is no edema, warmth, erythema, drainage. No induration or fluctuance.  Neurological: She is alert and oriented to person, place, and time. She has normal reflexes.  Skin: Skin is warm and dry.  Psychiatric: She has a normal mood and affect. Her behavior is normal. Thought content normal.    ED Course  Procedures (including critical care time) Labs Review Labs Reviewed - No data to display  Imaging Review No results found.  A few lateral and oblique views of the left foot were ordered and interpreted by me in the office. Impression: Patient has no fracture dislocation  of the foot. The calcaneus is normal. There is no noticeable soft tissue swelling. I have personally reviewed and evaluated these images and lab results as part of my medical decision-making.   EKG Interpretation None      MDM   Final diagnoses:  Foot pain, left  Soft tissue tumor of left foot    30 year old female with 5% any tissue to the inner aspect of her left foot. X-ray showed no evidence of acute bony abnormality. Patient will follow-up with podiatry. She is given tramadol for pain. Return to the ER for any worsening symptoms urgent changes in her health.    Duanne Guess, PA-C 10/23/15 1824  Nena Polio, MD 11/09/15 [phone]

## 2015-10-08 NOTE — ED Notes (Signed)
Patient presents to the ED with swollen area to the inside of her left foot.  Patient states area has been swollen for years but today it became more swollen and became painful for the first time.  Patient ambulatory at this time.

## 2015-10-08 NOTE — Discharge Instructions (Signed)
Cryotherapy Cryotherapy is when you put ice on your injury. Ice helps lessen pain and puffiness (swelling) after an injury. Ice works the best when you start using it in the first 24 to 48 hours after an injury. HOME CARE  Put a dry or damp towel between the ice pack and your skin.  You may press gently on the ice pack.  Leave the ice on for no more than 10 to 20 minutes at a time.  Check your skin after 5 minutes to make sure your skin is okay.  Rest at least 20 minutes between ice pack uses.  Stop using ice when your skin loses feeling (numbness).  Do not use ice on someone who cannot tell you when it hurts. This includes small children and people with memory problems (dementia). GET HELP RIGHT AWAY IF:  You have white spots on your skin.  Your skin turns blue or pale.  Your skin feels waxy or hard.  Your puffiness gets worse. MAKE SURE YOU:   Understand these instructions.  Will watch your condition.  Will get help right away if you are not doing well or get worse.   This information is not intended to replace advice given to you by your health care provider. Make sure you discuss any questions you have with your health care provider.   Document Released: 02/06/2008 Document Revised: 11/12/2011 Document Reviewed: 04/12/2011 Elsevier Interactive Patient Education [employer].  Please take medications as prescribed. Rest ice and elevate the lower extremity. Follow-up with podiatrist first of next week. Return to the ER for any worsening symptoms or urgent changes in health.

## 2015-10-24 ENCOUNTER — Other Ambulatory Visit: Payer: Self-pay | Admitting: Podiatry

## 2015-10-24 DIAGNOSIS — D492 Neoplasm of unspecified behavior of bone, soft tissue, and skin: Secondary | ICD-10-CM

## 2015-10-27 ENCOUNTER — Ambulatory Visit
Admission: RE | Admit: 2015-10-27 | Discharge: 2015-10-27 | Disposition: A | Discharge status: Home / Self Care | Payer: Medicaid Other | Source: Ambulatory Visit | Attending: Podiatry | Admitting: Podiatry

## 2015-10-27 DIAGNOSIS — D492 Neoplasm of unspecified behavior of bone, soft tissue, and skin: Secondary | ICD-10-CM | POA: Diagnosis present

## 2015-10-27 LAB — POCT I-STAT CREATININE: Creatinine, Ser: 0.5 mg/dL (ref 0.44–1.00)

## 2015-10-27 MED ORDER — GADOBENATE DIMEGLUMINE 529 MG/ML IV SOLN
20.0000 mL | Freq: Once | INTRAVENOUS | Status: AC | PRN
  Administered 2015-10-27: 20 mL via INTRAVENOUS

## 2015-11-21 ENCOUNTER — Emergency Department (HOSPITAL_COMMUNITY): Payer: No Typology Code available for payment source

## 2015-11-21 ENCOUNTER — Emergency Department (HOSPITAL_COMMUNITY)
Admission: EM | Admit: 2015-11-21 | Discharge: 2015-11-21 | Disposition: A | Discharge status: Home / Self Care | Payer: No Typology Code available for payment source | Attending: Emergency Medicine | Admitting: Emergency Medicine

## 2015-11-21 DIAGNOSIS — Y9389 Activity, other specified: Secondary | ICD-10-CM | POA: Diagnosis not present

## 2015-11-21 DIAGNOSIS — Y92410 Unspecified street and highway as the place of occurrence of the external cause: Secondary | ICD-10-CM | POA: Diagnosis not present

## 2015-11-21 DIAGNOSIS — Z8632 Personal history of gestational diabetes: Secondary | ICD-10-CM | POA: Insufficient documentation

## 2015-11-21 DIAGNOSIS — S199XXA Unspecified injury of neck, initial encounter: Secondary | ICD-10-CM | POA: Diagnosis not present

## 2015-11-21 DIAGNOSIS — S29001A Unspecified injury of muscle and tendon of front wall of thorax, initial encounter: Secondary | ICD-10-CM | POA: Diagnosis present

## 2015-11-21 DIAGNOSIS — Y998 Other external cause status: Secondary | ICD-10-CM | POA: Insufficient documentation

## 2015-11-21 DIAGNOSIS — Z79899 Other long term (current) drug therapy: Secondary | ICD-10-CM | POA: Insufficient documentation

## 2015-11-21 DIAGNOSIS — I10 Essential (primary) hypertension: Secondary | ICD-10-CM | POA: Insufficient documentation

## 2015-11-21 DIAGNOSIS — M542 Cervicalgia: Secondary | ICD-10-CM

## 2015-11-21 DIAGNOSIS — S4992XA Unspecified injury of left shoulder and upper arm, initial encounter: Secondary | ICD-10-CM | POA: Insufficient documentation

## 2015-11-21 DIAGNOSIS — Z7984 Long term (current) use of oral hypoglycemic drugs: Secondary | ICD-10-CM | POA: Insufficient documentation

## 2015-11-21 DIAGNOSIS — R0789 Other chest pain: Secondary | ICD-10-CM

## 2015-11-21 MED ORDER — METHOCARBAMOL 500 MG PO TABS: 500.0000 mg | ORAL_TABLET | Freq: Two times a day (BID) | ORAL | Status: DC

## 2015-11-21 MED ORDER — IBUPROFEN 800 MG PO TABS: 800.0000 mg | ORAL_TABLET | Freq: Three times a day (TID) | ORAL | Status: DC

## 2015-11-21 MED ORDER — METHOCARBAMOL 500 MG PO TABS
500.0000 mg | ORAL_TABLET | Freq: Once | ORAL | Status: AC
  Administered 2015-11-21: 500 mg via ORAL
  Filled 2015-11-21: qty 1

## 2015-11-21 MED ORDER — IBUPROFEN 800 MG PO TABS
800.0000 mg | ORAL_TABLET | Freq: Once | ORAL | Status: AC
  Administered 2015-11-21: 800 mg via ORAL
  Filled 2015-11-21: qty 1
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
## 2015-11-24 ENCOUNTER — Emergency Department (HOSPITAL_COMMUNITY)
Admission: EM | Admit: 2015-11-24 | Discharge: 2015-11-24 | Disposition: A | Discharge status: Home / Self Care | Payer: No Typology Code available for payment source | Attending: Physician Assistant | Admitting: Physician Assistant

## 2015-11-24 ENCOUNTER — Encounter (HOSPITAL_COMMUNITY): Payer: Self-pay | Admitting: Emergency Medicine

## 2015-11-24 DIAGNOSIS — R0602 Shortness of breath: Secondary | ICD-10-CM | POA: Diagnosis present

## 2015-11-24 DIAGNOSIS — Z79899 Other long term (current) drug therapy: Secondary | ICD-10-CM | POA: Diagnosis not present

## 2015-11-24 DIAGNOSIS — Z8632 Personal history of gestational diabetes: Secondary | ICD-10-CM | POA: Diagnosis not present

## 2015-11-24 DIAGNOSIS — I10 Essential (primary) hypertension: Secondary | ICD-10-CM | POA: Insufficient documentation

## 2015-11-24 DIAGNOSIS — R0789 Other chest pain: Secondary | ICD-10-CM | POA: Diagnosis not present

## 2015-11-24 DIAGNOSIS — Z7984 Long term (current) use of oral hypoglycemic drugs: Secondary | ICD-10-CM | POA: Insufficient documentation

## 2015-11-24 MED ORDER — TRAMADOL HCL 50 MG PO TABS: 50.0000 mg | ORAL_TABLET | Freq: Four times a day (QID) | ORAL | Status: DC | PRN
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
## 2015-11-24 NOTE — Discharge Instructions (Signed)

## 2015-11-29 ENCOUNTER — Encounter: Payer: Self-pay | Admitting: Family Medicine

## 2015-11-29 ENCOUNTER — Ambulatory Visit (INDEPENDENT_AMBULATORY_CARE_PROVIDER_SITE_OTHER): Payer: Self-pay | Admitting: Family Medicine

## 2015-11-29 VITALS — BP 152/100 | HR 103 | Temp 98.3°F | Ht 62.0 in | Wt 295.5 lb

## 2015-11-29 DIAGNOSIS — R0789 Other chest pain: Secondary | ICD-10-CM

## 2015-11-29 DIAGNOSIS — R079 Chest pain, unspecified: Secondary | ICD-10-CM | POA: Insufficient documentation
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
## 2016-12-18 ENCOUNTER — Telehealth: Payer: Self-pay | Admitting: Student

## 2016-12-18 NOTE — Telephone Encounter (Signed)
Pt is in the process of getting Medicaid and would like to call back to schedule an appt when she gets insurance. - Mesha Guinyard

## 2017-02-15 ENCOUNTER — Telehealth: Payer: Self-pay | Admitting: Student

## 2017-02-15 NOTE — Telephone Encounter (Signed)
N/a left message requesting pt to call back and schedule DM f/u - Mesha Guinyard

## 2017-11-01 ENCOUNTER — Inpatient Hospital Stay (HOSPITAL_COMMUNITY): Payer: Self-pay

## 2017-11-01 ENCOUNTER — Encounter (HOSPITAL_COMMUNITY): Payer: Self-pay

## 2017-11-01 ENCOUNTER — Inpatient Hospital Stay (HOSPITAL_COMMUNITY)
Admission: EM | Admit: 2017-11-01 | Discharge: 2017-11-05 | DRG: 247 | Disposition: A | Discharge status: Home / Self Care | Payer: Self-pay | Attending: Family Medicine | Admitting: Family Medicine

## 2017-11-01 ENCOUNTER — Emergency Department (HOSPITAL_COMMUNITY): Payer: Self-pay

## 2017-11-01 ENCOUNTER — Other Ambulatory Visit: Payer: Self-pay

## 2017-11-01 DIAGNOSIS — E8881 Metabolic syndrome: Secondary | ICD-10-CM | POA: Diagnosis present

## 2017-11-01 DIAGNOSIS — I249 Acute ischemic heart disease, unspecified: Secondary | ICD-10-CM

## 2017-11-01 DIAGNOSIS — R739 Hyperglycemia, unspecified: Secondary | ICD-10-CM | POA: Diagnosis present

## 2017-11-01 DIAGNOSIS — I429 Cardiomyopathy, unspecified: Secondary | ICD-10-CM

## 2017-11-01 DIAGNOSIS — I255 Ischemic cardiomyopathy: Secondary | ICD-10-CM | POA: Diagnosis present

## 2017-11-01 DIAGNOSIS — Z7984 Long term (current) use of oral hypoglycemic drugs: Secondary | ICD-10-CM

## 2017-11-01 DIAGNOSIS — I5022 Chronic systolic (congestive) heart failure: Secondary | ICD-10-CM | POA: Diagnosis present

## 2017-11-01 DIAGNOSIS — E119 Type 2 diabetes mellitus without complications: Secondary | ICD-10-CM | POA: Diagnosis present

## 2017-11-01 DIAGNOSIS — E1169 Type 2 diabetes mellitus with other specified complication: Secondary | ICD-10-CM | POA: Diagnosis present

## 2017-11-01 DIAGNOSIS — I119 Hypertensive heart disease without heart failure: Secondary | ICD-10-CM | POA: Diagnosis present

## 2017-11-01 DIAGNOSIS — E1165 Type 2 diabetes mellitus with hyperglycemia: Secondary | ICD-10-CM | POA: Diagnosis present

## 2017-11-01 DIAGNOSIS — I214 Non-ST elevation (NSTEMI) myocardial infarction: Principal | ICD-10-CM | POA: Diagnosis present

## 2017-11-01 DIAGNOSIS — I110 Hypertensive heart disease with heart failure: Secondary | ICD-10-CM | POA: Diagnosis present

## 2017-11-01 DIAGNOSIS — E785 Hyperlipidemia, unspecified: Secondary | ICD-10-CM | POA: Diagnosis present

## 2017-11-01 DIAGNOSIS — Z955 Presence of coronary angioplasty implant and graft: Secondary | ICD-10-CM

## 2017-11-01 DIAGNOSIS — I2510 Atherosclerotic heart disease of native coronary artery without angina pectoris: Secondary | ICD-10-CM | POA: Diagnosis present

## 2017-11-01 DIAGNOSIS — I10 Essential (primary) hypertension: Secondary | ICD-10-CM

## 2017-11-01 DIAGNOSIS — Z79899 Other long term (current) drug therapy: Secondary | ICD-10-CM

## 2017-11-01 DIAGNOSIS — D869 Sarcoidosis, unspecified: Secondary | ICD-10-CM | POA: Diagnosis present

## 2017-11-01 DIAGNOSIS — Z6841 Body Mass Index (BMI) 40.0 and over, adult: Secondary | ICD-10-CM

## 2017-11-01 DIAGNOSIS — E669 Obesity, unspecified: Secondary | ICD-10-CM | POA: Diagnosis present

## 2017-11-01 DIAGNOSIS — R000 Tachycardia, unspecified: Secondary | ICD-10-CM | POA: Diagnosis present

## 2017-11-01 DIAGNOSIS — Z8249 Family history of ischemic heart disease and other diseases of the circulatory system: Secondary | ICD-10-CM

## 2017-11-01 DIAGNOSIS — E039 Hypothyroidism, unspecified: Secondary | ICD-10-CM | POA: Diagnosis present

## 2017-11-01 HISTORY — DX: History of uterine scar from previous surgery: Z98.891

## 2017-11-01 HISTORY — DX: Herpesviral infection, unspecified: B00.9

## 2017-11-01 HISTORY — DX: Pre-existing hypertension with pre-eclampsia, unspecified trimester: O11.9

## 2017-11-01 HISTORY — DX: Acute ischemic heart disease, unspecified: I24.9

## 2017-11-01 HISTORY — DX: Gestational diabetes mellitus in pregnancy, unspecified control: O24.419

## 2017-11-01 HISTORY — DX: Hypothyroidism, unspecified: E03.9

## 2017-11-01 HISTORY — DX: Pre-existing essential hypertension complicating pregnancy, unspecified trimester: O10.019

## 2017-11-01 LAB — CBC
HCT: 39 % (ref 36.0–46.0)
Hemoglobin: 13.5 g/dL (ref 12.0–15.0)
MCH: 30.5 pg (ref 26.0–34.0)
MCHC: 34.6 g/dL (ref 30.0–36.0)
MCV: 88.2 fL (ref 78.0–100.0)
Platelets: 308 10*3/uL (ref 150–400)
RBC: 4.42 MIL/uL (ref 3.87–5.11)
RDW: 12.1 % (ref 11.5–15.5)
WBC: 7.7 10*3/uL (ref 4.0–10.5)

## 2017-11-01 LAB — BASIC METABOLIC PANEL
Anion gap: 14 (ref 5–15)
BUN: 10 mg/dL (ref 6–20)
CO2: 22 mmol/L (ref 22–32)
Calcium: 9.7 mg/dL (ref 8.9–10.3)
Chloride: 92 mmol/L — ABNORMAL LOW (ref 101–111)
Creatinine, Ser: 0.71 mg/dL (ref 0.44–1.00)
GFR calc Af Amer: >60 mL/min (ref >60)
GFR calc non Af Amer: >60 mL/min (ref >60)
Glucose, Bld: 511 mg/dL (ref 65–99)
Potassium: 3.9 mmol/L (ref 3.5–5.1)
Sodium: 128 mmol/L — ABNORMAL LOW (ref 135–145)

## 2017-11-01 LAB — I-STAT TROPONIN, ED: Troponin i, poc: 11.35 ng/mL (ref 0.00–0.08)

## 2017-11-01 LAB — HEMOGLOBIN A1C
Hgb A1c MFr Bld: 14.6 % — ABNORMAL HIGH (ref 4.8–5.6)
Mean Plasma Glucose: 372.32 mg/dL

## 2017-11-01 LAB — I-STAT BETA HCG BLOOD, ED (MC, WL, AP ONLY): I-stat hCG, quantitative: <5 m[IU]/mL (ref <5)

## 2017-11-01 LAB — TROPONIN I: Troponin I: 13.74 ng/mL (ref <0.03)

## 2017-11-01 LAB — MRSA PCR SCREENING: MRSA by PCR: NEGATIVE

## 2017-11-01 LAB — GLUCOSE, CAPILLARY: Glucose-Capillary: 313 mg/dL — ABNORMAL HIGH (ref 65–99)

## 2017-11-01 MED ORDER — ATORVASTATIN CALCIUM 80 MG PO TABS
80.0000 mg | ORAL_TABLET | Freq: Every day | ORAL | Status: DC
  Administered 2017-11-01 – 2017-11-04 (×4): 80 mg via ORAL
  Filled 2017-11-01 (×4): qty 1

## 2017-11-01 MED ORDER — ASPIRIN EC 81 MG PO TBEC
81.0000 mg | DELAYED_RELEASE_TABLET | Freq: Every day | ORAL | Status: DC
  Administered 2017-11-02 – 2017-11-03 (×2): 81 mg via ORAL
  Filled 2017-11-01 (×2): qty 1

## 2017-11-01 MED ORDER — HEPARIN (PORCINE) IN NACL 100-0.45 UNIT/ML-% IJ SOLN
1900.0000 [IU]/h | INTRAMUSCULAR | Status: DC
  Administered 2017-11-01: 1000 [IU]/h via INTRAVENOUS
  Administered 2017-11-03 – 2017-11-04 (×3): 1900 [IU]/h via INTRAVENOUS
  Filled 2017-11-01 (×5): qty 250

## 2017-11-01 MED ORDER — INSULIN ASPART 100 UNIT/ML ~~LOC~~ SOLN
0.0000 [IU] | Freq: Three times a day (TID) | SUBCUTANEOUS | Status: DC
  Administered 2017-11-02 (×2): 15 [IU] via SUBCUTANEOUS
  Administered 2017-11-02: 8 [IU] via SUBCUTANEOUS
  Administered 2017-11-03: 15 [IU] via SUBCUTANEOUS
  Administered 2017-11-03: 11 [IU] via SUBCUTANEOUS
  Administered 2017-11-03: 5 [IU] via SUBCUTANEOUS
  Administered 2017-11-04 (×2): 8 [IU] via SUBCUTANEOUS

## 2017-11-01 MED ORDER — ASPIRIN 81 MG PO CHEW
324.0000 mg | CHEWABLE_TABLET | Freq: Once | ORAL | Status: AC
  Administered 2017-11-01: 324 mg via ORAL
  Filled 2017-11-01: qty 4

## 2017-11-01 MED ORDER — ACETAMINOPHEN 325 MG PO TABS: 650.0000 mg | ORAL_TABLET | ORAL | Status: DC | PRN

## 2017-11-01 MED ORDER — HEPARIN BOLUS VIA INFUSION
4000.0000 [IU] | Freq: Once | INTRAVENOUS | Status: AC
  Administered 2017-11-01: 4000 [IU] via INTRAVENOUS
  Filled 2017-11-01: qty 4000

## 2017-11-01 MED ORDER — ONDANSETRON HCL 4 MG/2ML IJ SOLN: 4.0000 mg | Freq: Four times a day (QID) | INTRAMUSCULAR | Status: DC | PRN

## 2017-11-01 MED ORDER — IOPAMIDOL (ISOVUE-370) INJECTION 76%
INTRAVENOUS | Status: AC
  Administered 2017-11-01: 100 mL
  Filled 2017-11-01: qty 100

## 2017-11-01 MED ORDER — INSULIN GLARGINE 100 UNIT/ML ~~LOC~~ SOLN
10.0000 [IU] | Freq: Every day | SUBCUTANEOUS | Status: DC
  Administered 2017-11-01: 10 [IU] via SUBCUTANEOUS
  Filled 2017-11-01 (×2): qty 0.1

## 2017-11-01 MED ORDER — NITROGLYCERIN 0.4 MG SL SUBL
0.4000 mg | SUBLINGUAL_TABLET | SUBLINGUAL | Status: DC | PRN
  Administered 2017-11-01 (×2): 0.4 mg via SUBLINGUAL
  Filled 2017-11-01: qty 1

## 2017-11-01 MED ORDER — SODIUM CHLORIDE 0.9 % IV BOLUS (SEPSIS)
1000.0000 mL | Freq: Once | INTRAVENOUS | Status: AC
  Administered 2017-11-01: 1000 mL via INTRAVENOUS

## 2017-11-01 MED ORDER — METOPROLOL TARTRATE 5 MG/5ML IV SOLN
5.0000 mg | Freq: Once | INTRAVENOUS | Status: AC
  Administered 2017-11-01: 5 mg via INTRAVENOUS
  Filled 2017-11-01: qty 5

## 2017-11-01 MED ORDER — METOPROLOL TARTRATE 12.5 MG HALF TABLET
12.5000 mg | ORAL_TABLET | Freq: Two times a day (BID) | ORAL | Status: DC
  Administered 2017-11-01: 12.5 mg via ORAL
  Filled 2017-11-01: qty 1

## 2017-11-01 NOTE — Consult Note (Addendum)
CONSULTATION NOTE   Patient Name: Lauren Delgado Date of Encounter: 11/01/2017 Cardiologist: No primary care provider on file.  Chief Complaint   Chest pain, shortness of breath  Patient Profile   32 yo female with history of gestational diabetes, HTN and family history of CAD, presents with 2 weeks of SSCP which is intermittent, associated with shortness of breath, congestion, cough and chills.  HPI   Lauren Delgado is a 32 y.o. female who is being seen today for the evaluation of chest pain and dyspnea at the request of Dr. Maryan Rued. This is a 32 yo female with a history of gestational diabetes, HTN and family history of CAD, presents with 2 weeks of SSCP which is intermittent, associated with shortness of breath, congestion, cough and chills. She describes the chest pain as sharp and intermittent, at rest and more recently with exertion, accompanied by shortness of breath. She also felt that she developed URI symptoms, however, only about 3 days ago. On admission she is noted to be tachycardic. EKG personally reviewed which shows sinus tachycardia at 115, low voltage in the extremity leads, mild anteroseptal ST elevation <1 mm at the jPoint in V1-V3, however, there is subtle inferior and lateral ST elevation, possibly consistent with pericarditis or early repolarization. CXR was abnormal, suggesting a diffuse nodular interstitial disease, possibly associated with infection or sarcoidosis, but no adenopathy. This is apparently stable compared to prior studies. Heart size was "normal". Labs remarkable for low sodium and chloride and a high BG of 511. The initial POC troponin was 11.35, which prompted the consultation.  PMHx   Past Medical History:  Diagnosis Date  . Gestational diabetes mellitus, antepartum 2015  . Hypertension     Past Surgical History:  Procedure Laterality Date  . CESAREAN SECTION N/A 09/09/2014   Procedure: CESAREAN SECTION;  Surgeon: Delice Lesch, MD;   Location: [facility];  Service: Obstetrics;  Laterality: N/A;  . NO PAST SURGERIES      FAMHx   Family History  Problem Relation Age of Onset  . Arthritis Mother   . Depression Mother   . Hypertension Mother   . Miscarriages / [place] Mother   . Asthma Mother   . Arthritis Father   . Hypertension Father   . Vision loss Father   . Cancer Maternal Aunt   . COPD Maternal Aunt   . Hypertension Maternal Aunt   . Miscarriages / Stillbirths Maternal Aunt   . Heart disease Maternal Uncle   . Hypertension Maternal Uncle   . Learning disabilities Maternal Uncle   . Mental retardation Maternal Uncle   . Asthma Brother   . Depression Brother   . Hypertension Brother   . Learning disabilities Brother   . Hypertension Paternal Aunt   . Hypertension Paternal Uncle   . Learning disabilities Paternal Uncle   . Mental retardation Paternal Uncle   . Arthritis Maternal Grandmother   . Diabetes Maternal Grandmother   . Stroke Maternal Grandmother   . Hypertension Maternal Grandmother   . Varicose Veins Maternal Grandmother   . Arthritis Maternal Grandfather   . COPD Maternal Grandfather   . Diabetes Maternal Grandfather   . Hearing loss Maternal Grandfather   . Hypertension Maternal Grandfather   . Arthritis Paternal Grandmother   . Diabetes Paternal Grandmother   . Hypertension Paternal Grandmother   . Arthritis Paternal Grandfather   . Diabetes Paternal Grandfather   . Hypertension Paternal Grandfather     SOCHx  reports that  has never smoked. she has never used smokeless tobacco. She reports that she does not drink alcohol or use drugs.  Outpatient Medications   No current facility-administered medications on file prior to encounter.    Current Outpatient Medications on File Prior to Encounter  Medication Sig Dispense Refill  . glyBURIDE (DIABETA) 2.5 MG tablet TAKE 1 TABLET (2.5 MG TOTAL) BY MOUTH 2 (TWO) TIMES DAILY WITH A MEAL. (Patient not taking: Reported on  11/29/2015) 60 tablet 0  . hydrochlorothiazide (HYDRODIURIL) 12.5 MG tablet Take 1 tablet (12.5 mg total) by mouth daily. 90 tablet 3  . ibuprofen (ADVIL,MOTRIN) 800 MG tablet Take 1 tablet (800 mg total) by mouth 3 (three) times daily. 21 tablet 0  . metFORMIN (GLUCOPHAGE) 1000 MG tablet Take 1 tablet (1,000 mg total) by mouth 2 (two) times daily with a meal. 60 tablet 0  . methocarbamol (ROBAXIN) 500 MG tablet Take 1 tablet (500 mg total) by mouth 2 (two) times daily. (Patient not taking: Reported on 11/29/2015) 20 tablet 0  . traMADol (ULTRAM) 50 MG tablet Take 1 tablet (50 mg total) by mouth every 6 (six) hours as needed for moderate pain or severe pain. 12 tablet 0  . valACYclovir (VALTREX) 500 MG tablet TAKE 1 TABLET (500 MG TOTAL) BY MOUTH 1 DAY OR 1 DOSE. 30 tablet 3    Inpatient Medications    Scheduled Meds:   Continuous Infusions: . heparin 1,000 Units/hr (11/01/17 1746)  . sodium chloride 1,000 mL (11/01/17 1743)    PRN Meds: nitroGLYCERIN   ALLERGIES   No Known Allergies  ROS   Pertinent items noted in HPI and remainder of comprehensive ROS otherwise negative.  Vitals   Vitals:   11/01/17 1524 11/01/17 1529 11/01/17 1530 11/01/17 1638  BP: (!) 159/110   (!) 141/87  Pulse:   (!) 116 (!) 113  Resp: 18   19  Temp: 97.7 F (36.5 C)     TempSrc: Oral     SpO2: 100%   100%  Weight:  295 lb (133.8 kg)    Height:  5' 2" (1.575 m)     No intake or output data in the 24 hours ending 11/01/17 1814 Filed Weights   11/01/17 1529  Weight: 295 lb (133.8 kg)    Physical Exam   General appearance: alert, mild distress and morbidly obese Neck: no carotid bruit, no JVD and thyroid not enlarged, symmetric, no tenderness/mass/nodules Lungs: diminished breath sounds bilaterally Heart: regular tachycardia, no murmur Abdomen: morbidly obese, soft, non-tender Extremities: extremities normal, atraumatic, no cyanosis or edema Pulses: 2+ and symmetric Skin: Skin color,  texture, turgor normal. No rashes or lesions Neurologic: Grossly normal Psych: Tearful, appears upset  Labs   Results for orders placed or performed during the hospital encounter of 11/01/17 (from the past 48 hour(s))  Basic metabolic panel     Status: Abnormal   Collection Time: 11/01/17  3:30 PM  Result Value Ref Range   Sodium 128 (L) 135 - 145 mmol/L   Potassium 3.9 3.5 - 5.1 mmol/L   Chloride 92 (L) 101 - 111 mmol/L   CO2 22 22 - 32 mmol/L   Glucose, Bld 511 (HH) 65 - 99 mg/dL    Comment: CRITICAL RESULT CALLED TO, READ BACK BY AND VERIFIED WITH: SFaythe Dingwall RN [phone] [address] R    BUN 10 6 - 20 mg/dL   Creatinine, Ser 0.71 0.44 - 1.00 mg/dL   Calcium 9.7 8.9 - 10.3 mg/dL  GFR calc non Af Amer >60 >60 mL/min   GFR calc Af Amer >60 >60 mL/min    Comment: (NOTE) The eGFR has been calculated using the CKD EPI equation. This calculation has not been validated in all clinical situations. eGFR's persistently <60 mL/min signify possible Chronic Kidney Disease.    Anion gap 14 5 - 15    Comment: Performed at [facility] [address] [place], [place] [address]  CBC     Status: None   Collection Time: 11/01/17  3:30 PM  Result Value Ref Range   WBC 7.7 4.0 - 10.5 K/uL   RBC 4.42 3.87 - 5.11 MIL/uL   Hemoglobin 13.5 12.0 - 15.0 g/dL   HCT 39.0 36.0 - 46.0 %   MCV 88.2 78.0 - 100.0 fL   MCH 30.5 26.0 - 34.0 pg   MCHC 34.6 30.0 - 36.0 g/dL   RDW 12.1 11.5 - 15.5 %   Platelets 308 150 - 400 K/uL    Comment: Performed at [facility] [address] [place], [place] [address]  I-stat troponin, ED     Status: Abnormal   Collection Time: 11/01/17  3:50 PM  Result Value Ref Range   Troponin i, poc 11.35 (HH) 0.00 - 0.08 ng/mL   Comment NOTIFIED PHYSICIAN    Comment 3            Comment: Due to the release kinetics of cTnI, a negative result within the first hours of the onset of symptoms does not rule out myocardial infarction with certainty. If myocardial  infarction is still suspected, repeat the test at appropriate intervals.   I-Stat beta hCG blood, ED     Status: None   Collection Time: 11/01/17  3:50 PM  Result Value Ref Range   I-stat hCG, quantitative <5.0 <5 mIU/mL   Comment 3            Comment:   GEST. AGE      CONC.  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 There is no new opacity. No consolidation. Heart size and pulmonary vascularity are normal. No adenopathy. No bone lesions. 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 Etiology uncertain. This appearance may be seen with prior granulomatous type infection or sarcoidosis. Note absence of adenopathy. This appearance also may be due to atypical allergic type response. Stability over time suggests benign etiology. No new opacity evident. No consolidation. No adenopathy. Heart size normal. Electronically Signed   By: Lowella Grip III M.D.   On: 11/01/2017 16:06    Cardiac Studies   N/A  Impression   Principal Problem:   NSTEMI (non-ST elevated myocardial infarction) ([place]) Active Problems:   Severe obesity (BMI >= 40) (HCC)   Essential hypertension, benign   Type 2 diabetes mellitus without complication (HCC)   Hyperglycemia   Recommendation   1. Mrs. Altamura has had several weeks of chest pain, recently complicated by URI symptoms - She has a markedly  elevated troponin, suggestive of a cardiac event which was recent or possibly consistent with myopericarditis. There is borderline anterior ST elevation vs repolarization abnormality - she is tachycardic. She had resolution of her chest pain on heparin and after 2 nitroglycerin. She also has chronic CXR changes concerning for sarcoidosis (her Mom has that diagnosis) - she has poorly controlled diabetes. She will need medical optimization and ultimately cardiac catheterization on Monday. D/W Dr. Maryan Rued - would admit to hospital medicine. Will start aspirin, statin and BB and obtain a 2D echo. Trend cardiac enzymes overnight.  She is best admitted to stepdown for closer monitoring overnight, in my opinion.  Thanks for the consultation. Cardiology will follow with you.  Time Spent Directly with Patient:  I have spent a total of 65 minutes with the patient reviewing hospital notes, telemetry, EKGs, labs and examining the patient as well as establishing an assessment and plan that was discussed personally with the patient. > 50% of time was spent in direct patient care.  Length of Stay:  LOS: 0 days   Pixie Casino, MD, [facility], [place] Director of the [facility] Lipid [facility] &  Cardiovascular Risk Reduction Clinic Diplomate of the American Board of Clinical Lipidology Attending Cardiologist  Direct Dial: [phone]  Fax: [phone]  Website:  [name] 11/01/2017, 6:14 PM
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
## 2017-11-01 NOTE — ED Triage Notes (Signed)
Pt presents to the ed with complaints of shortness of breath and chest pain x 2 weeks. Endorses the pain radiating down her right arm and cold like symptoms.

## 2017-11-01 NOTE — ED Notes (Signed)
Pt presents with chest pain that has been going on for 2 weeks now. Pt states the pain is constant and gets worse when the pt moves. Pt states nothing she has done helps the pain.

## 2017-11-01 NOTE — ED Notes (Signed)
Informed first nurse Jarrett Soho of I-stat troponin 11.35

## 2017-11-01 NOTE — Progress Notes (Signed)
ANTICOAGULATION CONSULT NOTE - Initial Consult  Pharmacy Consult for heparin Indication: chest pain/ACS  No Known Allergies  Patient Measurements: Height: 5\' 2"  (157.5 cm) Weight: 295 lb (133.8 kg) IBW/kg (Calculated) : 50.1 Heparin Dosing Weight: 84 kg   Vital Signs: Temp: 97.7 F (36.5 C) (03/01 1524) Temp Source: Oral (03/01 1524) BP: 141/87 (03/01 1638) Pulse Rate: 113 (03/01 1638)  Labs: Recent Labs    11/01/17 1530  HGB 13.5  HCT 39.0  PLT 308  CREATININE 0.71    Estimated Creatinine Clearance: 135.7 mL/min (by C-G formula based on SCr of 0.71 mg/dL).   Medical History: Past Medical History:  Diagnosis Date  . Gestational diabetes mellitus, antepartum 2015  . Hypertension     Medications:  Scheduled:  . aspirin  324 mg Oral Once  . heparin  4,000 Units Intravenous Once    Assessment: 36 yof presenting with 2 weeks of intermittent chest discomfort and SOB. Also presenting with cold-like symptoms (congestion, cough, chills). Has strong family cardiac history. No anticoagulation prior to admission.  Hgb is 13.5, plts WNL. Trop is 11.35. Renal function is stable (Scr 0.71).   Goal of Therapy:  Heparin level 0.3-0.7 units/ml Monitor platelets by anticoagulation protocol: Yes   Plan:  Give 4000 units bolus x 1 Start heparin infusion at 1000 units/hr Check anti-Xa level in 6 hours and daily while on heparin Continue to monitor H&H and platelets  Doylene Canard, PharmD Clinical Pharmacist  Pager: [phone] Phone: [phone] 11/01/2017,5:32 PM
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
## 2017-11-02 ENCOUNTER — Inpatient Hospital Stay (HOSPITAL_COMMUNITY): Payer: Self-pay

## 2017-11-02 ENCOUNTER — Encounter (HOSPITAL_COMMUNITY): Payer: Self-pay | Admitting: *Deleted

## 2017-11-02 DIAGNOSIS — E8881 Metabolic syndrome: Secondary | ICD-10-CM | POA: Diagnosis present

## 2017-11-02 DIAGNOSIS — I249 Acute ischemic heart disease, unspecified: Secondary | ICD-10-CM

## 2017-11-02 DIAGNOSIS — R9431 Abnormal electrocardiogram [ECG] [EKG]: Secondary | ICD-10-CM

## 2017-11-02 DIAGNOSIS — E1165 Type 2 diabetes mellitus with hyperglycemia: Secondary | ICD-10-CM

## 2017-11-02 DIAGNOSIS — E785 Hyperlipidemia, unspecified: Secondary | ICD-10-CM

## 2017-11-02 DIAGNOSIS — E1169 Type 2 diabetes mellitus with other specified complication: Secondary | ICD-10-CM | POA: Diagnosis present

## 2017-11-02 DIAGNOSIS — E669 Obesity, unspecified: Secondary | ICD-10-CM | POA: Diagnosis present

## 2017-11-02 DIAGNOSIS — E118 Type 2 diabetes mellitus with unspecified complications: Secondary | ICD-10-CM

## 2017-11-02 LAB — CBC
HCT: 35.2 % — ABNORMAL LOW (ref 36.0–46.0)
HCT: 36.7 % (ref 36.0–46.0)
Hemoglobin: 12.2 g/dL (ref 12.0–15.0)
Hemoglobin: 12.7 g/dL (ref 12.0–15.0)
MCH: 30.5 pg (ref 26.0–34.0)
MCH: 30.5 pg (ref 26.0–34.0)
MCHC: 34.7 g/dL (ref 30.0–36.0)
MCHC: 34.6 g/dL (ref 30.0–36.0)
MCV: 88 fL (ref 78.0–100.0)
MCV: 88.2 fL (ref 78.0–100.0)
Platelets: 328 10*3/uL (ref 150–400)
Platelets: 312 10*3/uL (ref 150–400)
RBC: 4 MIL/uL (ref 3.87–5.11)
RBC: 4.16 MIL/uL (ref 3.87–5.11)
RDW: 12.1 % (ref 11.5–15.5)
RDW: 12.2 % (ref 11.5–15.5)
WBC: 8.7 10*3/uL (ref 4.0–10.5)
WBC: 7.9 10*3/uL (ref 4.0–10.5)

## 2017-11-02 LAB — BASIC METABOLIC PANEL
Anion gap: 13 (ref 5–15)
BUN: 11 mg/dL (ref 6–20)
CO2: 21 mmol/L — ABNORMAL LOW (ref 22–32)
Calcium: 8.8 mg/dL — ABNORMAL LOW (ref 8.9–10.3)
Chloride: 97 mmol/L — ABNORMAL LOW (ref 101–111)
Creatinine, Ser: 0.68 mg/dL (ref 0.44–1.00)
GFR calc Af Amer: >60 mL/min (ref >60)
GFR calc non Af Amer: >60 mL/min (ref >60)
Glucose, Bld: 387 mg/dL — ABNORMAL HIGH (ref 65–99)
Potassium: 3.9 mmol/L (ref 3.5–5.1)
Sodium: 131 mmol/L — ABNORMAL LOW (ref 135–145)

## 2017-11-02 LAB — TROPONIN I
Troponin I: 14.08 ng/mL (ref <0.03)
Troponin I: 9.28 ng/mL (ref <0.03)

## 2017-11-02 LAB — RAPID URINE DRUG SCREEN, HOSP PERFORMED
Amphetamines: NOT DETECTED
Barbiturates: NOT DETECTED
Benzodiazepines: NOT DETECTED
Cocaine: NOT DETECTED
Opiates: NOT DETECTED
Tetrahydrocannabinol: NOT DETECTED

## 2017-11-02 LAB — GLUCOSE, CAPILLARY
Glucose-Capillary: 360 mg/dL — ABNORMAL HIGH (ref 65–99)
Glucose-Capillary: 380 mg/dL — ABNORMAL HIGH (ref 65–99)
Glucose-Capillary: 272 mg/dL — ABNORMAL HIGH (ref 65–99)
Glucose-Capillary: 273 mg/dL — ABNORMAL HIGH (ref 65–99)

## 2017-11-02 LAB — MAGNESIUM: Magnesium: 1.7 mg/dL (ref 1.7–2.4)

## 2017-11-02 LAB — ECHOCARDIOGRAM COMPLETE
Height: 63 in
Weight: 4272 oz

## 2017-11-02 LAB — LIPID PANEL
Cholesterol: 203 mg/dL — ABNORMAL HIGH (ref 0–200)
HDL: 34 mg/dL — ABNORMAL LOW (ref >40)
LDL Cholesterol: UNDETERMINED mg/dL (ref 0–99)
Total CHOL/HDL Ratio: 6 RATIO
Triglycerides: 414 mg/dL — ABNORMAL HIGH (ref <150)
VLDL: UNDETERMINED mg/dL (ref 0–40)

## 2017-11-02 LAB — HEPARIN LEVEL (UNFRACTIONATED)
Heparin Unfractionated: <0.1 IU/mL — ABNORMAL LOW (ref 0.30–0.70)
Heparin Unfractionated: 0.15 IU/mL — ABNORMAL LOW (ref 0.30–0.70)
Heparin Unfractionated: 0.33 IU/mL (ref 0.30–0.70)
Heparin Unfractionated: 0.26 IU/mL — ABNORMAL LOW (ref 0.30–0.70)

## 2017-11-02 LAB — HIV ANTIBODY (ROUTINE TESTING W REFLEX): HIV Screen 4th Generation wRfx: NONREACTIVE

## 2017-11-02 MED ORDER — METOPROLOL TARTRATE 5 MG/5ML IV SOLN
5.0000 mg | Freq: Four times a day (QID) | INTRAVENOUS | Status: DC
  Administered 2017-11-02 – 2017-11-03 (×4): 5 mg via INTRAVENOUS
  Filled 2017-11-02 (×5): qty 5

## 2017-11-02 MED ORDER — POTASSIUM CHLORIDE CRYS ER 20 MEQ PO TBCR
20.0000 meq | EXTENDED_RELEASE_TABLET | Freq: Every day | ORAL | Status: DC
  Administered 2017-11-02 – 2017-11-05 (×4): 20 meq via ORAL
  Filled 2017-11-02 (×4): qty 1

## 2017-11-02 MED ORDER — PNEUMOCOCCAL VAC POLYVALENT 25 MCG/0.5ML IJ INJ: 0.5000 mL | INJECTION | INTRAMUSCULAR | Status: DC | PRN

## 2017-11-02 MED ORDER — MAGNESIUM SULFATE 2 GM/50ML IV SOLN
2.0000 g | Freq: Once | INTRAVENOUS | Status: AC
  Administered 2017-11-02: 2 g via INTRAVENOUS
  Filled 2017-11-02: qty 50

## 2017-11-02 MED ORDER — INSULIN GLARGINE 100 UNIT/ML ~~LOC~~ SOLN
10.0000 [IU] | Freq: Every day | SUBCUTANEOUS | Status: DC
  Administered 2017-11-02 – 2017-11-03 (×2): 10 [IU] via SUBCUTANEOUS
  Filled 2017-11-02 (×3): qty 0.1

## 2017-11-02 MED ORDER — HEPARIN BOLUS VIA INFUSION
2000.0000 [IU] | Freq: Once | INTRAVENOUS | Status: AC
  Administered 2017-11-02: 2000 [IU] via INTRAVENOUS
  Filled 2017-11-02: qty 2000

## 2017-11-02 MED ORDER — CANAGLIFLOZIN 100 MG PO TABS
100.0000 mg | ORAL_TABLET | Freq: Every day | ORAL | Status: DC
  Administered 2017-11-03 – 2017-11-05 (×2): 100 mg via ORAL
  Filled 2017-11-02 (×3): qty 1

## 2017-11-02 MED ORDER — CLOPIDOGREL BISULFATE 75 MG PO TABS
75.0000 mg | ORAL_TABLET | Freq: Every day | ORAL | Status: DC
  Administered 2017-11-02 – 2017-11-03 (×2): 75 mg via ORAL
  Filled 2017-11-02 (×2): qty 1

## 2017-11-02 MED ORDER — INFLUENZA VAC SPLIT QUAD 0.5 ML IM SUSY: 0.5000 mL | PREFILLED_SYRINGE | INTRAMUSCULAR | Status: DC | PRN

## 2017-11-02 MED ORDER — HEPARIN BOLUS VIA INFUSION
2500.0000 [IU] | Freq: Once | INTRAVENOUS | Status: AC
  Administered 2017-11-02: 2500 [IU] via INTRAVENOUS
  Filled 2017-11-02: qty 2500

## 2017-11-02 MED ORDER — INSULIN GLARGINE 100 UNIT/ML ~~LOC~~ SOLN
15.0000 [IU] | Freq: Every day | SUBCUTANEOUS | Status: DC
  Filled 2017-11-02: qty 0.15

## 2017-11-02 NOTE — Progress Notes (Signed)
  Echocardiogram 2D Echocardiogram has been performed.  Lauren Delgado F 11/02/2017, 1:22 PM

## 2017-11-02 NOTE — Progress Notes (Signed)
  RD consulted for nutrition education regarding diabetes.   Lab Results  Component Value Date   HGBA1C 14.6 (H) 11/01/2017    RD provided "Heart Healthy Carbohydrate Consistent Nutrition Therapy" handout from the Academy of [employer] Dietetics.   Patient sleeping. Woke up briefly, but requested that RD leave information at bedside. She did not seem interested in education at this time.  Expect poor compliance.  Body mass index is 47.3 kg/m. Pt meets criteria for class 3, extreme/morbid obesity based on current BMI.  Current diet order is heart healthy, patient is consuming approximately 100% of meals at this time. Labs and medications reviewed. No further nutrition interventions warranted at this time. RD contact information provided. If additional nutrition issues arise, please re-consult RD.  Recommend outpatient DM diet education.  Molli Barrows, RD, LDN, [place] Pager [phone] After Hours Pager [phone]

## 2017-11-02 NOTE — Progress Notes (Signed)
Family Medicine Teaching Service Daily Progress Note Intern Pager: [phone]  Patient name: Lauren Delgado Medical record number: [id number] Date of birth: Mar 25, 1986 Age: 32 y.o. Gender: female  Primary Care Provider: Patient, No Pcp Per Consultants: cardiology Code Status: FULL  Pt Overview and Major Events to Date:  3/1 admitted with CP, SOB; started on heparin drip 3/2 Lantus 10U started  Assessment and Plan: Lauren Delgado is a 32 y.o. female presenting with chest pain, SOB. PMH is significant for T2DM (last A1c 2016 13.6), HLD (no meds), morbid obesity, HTN (home med is HCTZ), HSV.   Chest pain: Cardiac etiology, likely NSTEMI, though concern for myopericarditis as well. Echo with EF 35-40% and findings suggestive of ischemic damage. Initial EKG without STEMI, ?ST elevation vs early repol in V2. Trop 13.7>14>9. CTA PE negative. Started on heparin drip on day of admission. Remains asymptomatic this AM.  - Cards consulted, appreciate recs - Cath 03/03 per cards recs - Continue heparin drip - Nitro PRN CP - Telemetry  Tachycardia:  Question dehydration (recent URI as well as osmotic diuresis 2/2 hyperglycemia) vs cardiac etiology. CTA PE negative. Improved with BB. HR 90-100 overnight.  - Cards following - Metoprolol 12.5mg  BID  DM: A1C on admission 14.6. Home meds are metformin and glyburide. Lantus 10U started 03/02 as patient requiring significant amount of sliding scale. CBG 273 overnight prior to insulin administration.  - CBG qACHS - Continue Lantus 10U qhs. Increase as needed pending sliding scale requirements - Moderate SSI   Possible sarcoidosis: Family hx sarcoidosis (mother). CXR finding is not new (consistent with CXR from 2017). Concerning for sarcoidosis. No cutaneous findings on exam. CTA with some findings suggestive of sarcoid (bilateral nodules), but no hilar adenopathy.  - Consider non urgent pulm consult vs outpatient pulm referral  - Needs  outpatient PFTs  HLD: Known, was previously recommended for lifestyle modifications. Total cholesterol 203, triglycerides 414.  - Consider fibrate given elevated triglyceries - Cont Lipitor 80mg  per cards recs  Obesity: Now with two associated comorbidities. Weight loss attempts unclear. May be a candidate for bariatric surgery.  - Consider outpatient bariatric surgery referral  Reproductive age:  Patient with Mirena IUD. Given medical disease, pregnancy would likely be high risk. Counseled the patient about likely need for ACEi and pregnancy category X. States she does not desire pregnancy at present.  - Continue Mirena IUD  FEN/GI: heart healthy diet Prophylaxis: heparin infusion   Disposition: continued care in [facility]  Subjective:  Patient with no complaints this AM. Denies CP, SOB. No acute events overnight.   Objective: Temp:  [97.8 F (36.6 C)-98.7 F (37.1 C)] 98.7 F (37.1 C) (03/03 0449) Pulse Rate:  [91-110] 101 (03/03 0449) Resp:  [16-21] 21 (03/03 0449) BP: (75-142)/(57-108) 122/78 (03/03 0449) SpO2:  [100 %] 100 % (03/03 0449) Weight:  [269 lb 4.8 oz (122.2 kg)] 269 lb 4.8 oz (122.2 kg) (03/03 0449) Physical Exam: General: obese female lying in bed in NAD  Cardiovascular: RRR, no murmur Respiratory: CTAB, no wheezes or crackles Extremities: no edema Skin: warm and dry Psych: appropriate mood, flat affect Neuro: A&Ox3  Laboratory: Recent Labs  Lab 11/01/17 1530 11/02/17 0015 11/02/17 0706  WBC 7.7 8.7 7.9  HGB 13.5 12.2 12.7  HCT 39.0 35.2* 36.7  PLT 308 328 312   Recent Labs  Lab 11/01/17 1530 11/02/17 0706  NA 128* 131*  K 3.9 3.9  CL 92* 97*  CO2 22 21*  BUN 10 11  CREATININE  0.71 0.68  CALCIUM 9.7 8.8*  GLUCOSE 511* 387*    Trop 11.35>13.85>14  Imaging/Diagnostic Tests: Dg Chest 2 View  Result Date: 11/01/2017 CLINICAL DATA:  Cough and shortness of breath EXAM: CHEST  2 VIEW COMPARISON:  November 21, 2015 FINDINGS: Diffuse nodular  interstitial disease throughout the lungs is chronic and stable. There is no new opacity. No consolidation. Heart size and pulmonary vascularity are normal. No adenopathy. No bone lesions. 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 Etiology uncertain. This appearance may be seen with prior granulomatous type infection or sarcoidosis. Note absence of adenopathy. This appearance also may be due to atypical allergic type response. Stability over time suggests benign etiology. No new opacity evident. No consolidation. No adenopathy. Heart size normal. Electronically Signed   By: Lowella Grip III M.D.   On: 11/01/2017 16:06   Ct Angio Chest Pe W Or Wo Contrast  Result Date: 11/01/2017 CLINICAL DATA:  Chest pain for 2 weeks. PE suspected, low pretest prob EXAM: CT ANGIOGRAPHY CHEST WITH CONTRAST TECHNIQUE: Multidetector CT imaging of the chest was performed using the standard protocol during bolus administration of intravenous contrast. Multiplanar CT image reconstructions and MIPs were obtained to evaluate the vascular anatomy. CONTRAST:  50 cc ISOVUE-370 IOPAMIDOL (ISOVUE-370) INJECTION 76% COMPARISON:  Chest radiograph earlier this day, and 11/21/2015 FINDINGS: Cardiovascular: There are no filling defects within the pulmonary arteries to suggest pulmonary embolus. The heart is normal in size. No pericardial effusion. Mediastinum/Nodes: No enlarged mediastinal or hilar lymph nodes. No axillary adenopathy. Visualized thyroid gland is normal. The esophagus is decompressed. Lungs/Pleura: Bilateral small nodular opacities throughout both lungs. There may be slight perihilar distribution, no apical to basilar gradient. No definite nodular calcification. No confluent airspace disease. No pulmonary edema. No pleural fluid. Trachea and mainstem bronchi are patent. Upper Abdomen: No acute abnormality. Musculoskeletal: There are no acute or suspicious osseous abnormalities. Review of the MIP  images confirms the above findings. IMPRESSION: 1. No pulmonary embolus. 2. Multiple small bilateral pulmonary nodules, chronic and similar to prior radiographs dating back to 2017. Etiology is uncertain, slight perihilar distribution can be seen in the setting of sarcoidosis, however no associated adenopathy. Sequela of prior infectious or inflammatory process is also considered. Electronically Signed   By: Jeb Levering M.D.   On: 11/01/2017 22:19     Verner Mould, MD 11/03/2017, 6:38 AM PGY-3, [place] Intern pager: [phone], text pages welcome

## 2017-11-02 NOTE — Progress Notes (Signed)
ANTICOAGULATION CONSULT NOTE - Follow Up Consult  Pharmacy Consult for heparin Indication: chest pain/ACS  No Known Allergies  Patient Measurements: Height: 5\' 3"  (160 cm) Weight: 267 lb (121.1 kg) IBW/kg (Calculated) : 52.4 Heparin Dosing Weight: 84 kg  Vital Signs: Temp: 98.7 F (37.1 C) (03/02 0413) Temp Source: Oral (03/02 0413) BP: 129/82 (03/02 0739) Pulse Rate: 110 (03/02 0739)  Labs: Recent Labs    11/01/17 1530 11/01/17 2054 11/02/17 0015 11/02/17 0706  HGB 13.5  --  12.2 12.7  HCT 39.0  --  35.2* 36.7  PLT 308  --  328 312  HEPARINUNFRC  --   --  <0.10* 0.15*  CREATININE 0.71  --   --   --   TROPONINI  --  13.74* 14.08*  --     Estimated Creatinine Clearance: 129.7 mL/min (by C-G formula based on SCr of 0.71 mg/dL).   Medications:  Scheduled:  . aspirin EC  81 mg Oral Daily  . atorvastatin  80 mg Oral QHS  . [START ON 11/03/2017] canagliflozin  100 mg Oral QAC breakfast  . clopidogrel  75 mg Oral Daily  . heparin  2,500 Units Intravenous Once  . insulin aspart  0-15 Units Subcutaneous TID WC  . insulin glargine  10 Units Subcutaneous QHS  . metoprolol tartrate  5 mg Intravenous Q6H  . potassium chloride  20 mEq Oral Daily   Infusions:  . heparin 1,400 Units/hr (11/02/17 0151)    Assessment: 81 YOF with morbid obesity on heparin for NSTEMI, cardiology planning for cath on Monday. Initial heparin level undetectable, so pharmacy gave bolus and increased rate. Now heparin level is 0.15, still subtherapeutic. Per RN the first IV line was causing pain so it was switched to other arm, but there was not any significant interruption in heparin therapy. HGB dropped 13.5 > 12.7 overnight, platelets are stable, no bleeding noted.  Goal of Therapy:  Heparin level 0.3-0.7 units/ml Monitor platelets by anticoagulation protocol: Yes   Plan:  Heparin bolus 2500 units x 1 Increase heparin rate to 1700 units/hr Check 6-hour heparin level Daily heparin level,  CBC Monitor s/sx of bleeding   Charlene Brooke, PharmD PGY1 Pharmacy Resident Phone: [phone] After 3:30PM please call [facility] [phone] 11/02/2017,8:52 AM

## 2017-11-02 NOTE — Progress Notes (Signed)
Family Medicine Teaching Service Daily Progress Note Intern Pager: [phone]  Patient name: Lauren Delgado Medical record number: [id number] Date of birth: Feb 24, 1986 Age: 32 y.o. Gender: female  Primary Care Provider: Patient, No Pcp Per Consultants: cardiology Code Status: FULL  Pt Overview and Major Events to Date:  3/1 admitted with CP, SOB  Assessment and Plan: Lauren Delgado is a 32 y.o. female presenting with chest pain, SOB. PMH is significant for T2DM (last A1c 2016 13.6), HLD (no meds), morbid obesity, HTN (home med is HCTZ), HSV.   Chest pain: etiology likely cardiac given elevated trop to 11 and hx of CP with exertion, which became constant. EKG without STEMI, ?ST elevation vs early repol in V2.  Cards consulted from ED, planning cath likely Monday. CTA PE negative. Differential also includes pericarditis although CXR without enlarged cardiac silhouette. Echo ordered to eval. If workup is conclusive for sarcoidosis below, this could contribute to CP in a vasculitis component or could increase risk of CAD.  -cards consulted, appreciate recs: heparin drip, ASA, BB, likely cath 3/4 -continue heparin drip -nitro PRN CP -continuous cardiac monitoring -echo ordered -trend troponins   Tachycardia:  Question dehydration (recent URI as well as osmotic diuresis 2/2 hyperglycemia) vs cardiac etiology. CTA PE negative. Improved with BB. -cards following -metoprolol 12.5mg  BID  DM: patient appears to have been lost to follow up. This likely increased her risk for above. Last a1c 2016. Home meds are metformin and glyburide. Of note Na corrects to normal range with glucose. Repeat a1c 14.6.  -continue Lantus 10U -moderate SSI   Possible sarcoidosis: patient's mother with sarcoidosis. This CXR finding is not new (consistent with CXR from 2017). Concerning for sarcoidosis. No cutaneous findings on exam. CTA with some findings suggestive of sarcoid (bilateral nodules), but no hilar  adenopathy.  -consider non urgent pulm consult vs outpatient pulm referral  -needs outpatient PFTs  HLD: hx of this, was previously recommended for lifestyle modifications. Needs statin.  -consider fibrate given elevated triglyceries -cards already ordered Lipitor 80mg   Obesity: patient now with two sequelae. Unsure to what degree patient has tried weight loss. May be a candidate for bariatric surgery.  -consider outpatient bariatric surgery referral  Reproductive age: patient with Mirena IUD. Given medical disease, pregnancy would likely be high risk. Counseled the patient about likely need for ACEi and pregnancy category X. States she does not desire pregnancy at present.  -continue Mirena IUD  FEN/GI: heart healthy diet Prophylaxis: heparin infusion   Disposition: continued care in [place]  Subjective:  Patient tired, denies CP. States she has a mirena as above, does not desire children at present. Understands the need for outpatient pulm f/u.   Objective: Temp:  [97.7 F (36.5 C)-98.5 F (36.9 C)] 98 F (36.7 C) (03/02 0002) Pulse Rate:  [103-116] 103 (03/02 0002) Resp:  [11-27] 17 (03/02 0002) BP: (127-162)/(70-111) 127/70 (03/02 0002) SpO2:  [96 %-100 %] 100 % (03/02 0002) FiO2 (%):  [21 %] 21 % (03/01 1943) Weight:  [265 lb 6.4 oz (120.4 kg)-295 lb (133.8 kg)] 265 lb 6.4 oz (120.4 kg) (03/01 2011) Physical Exam: General: obese female lying in bed in NAD.  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 There is no new opacity. No consolidation. Heart size and pulmonary vascularity are normal. No adenopathy. No bone lesions. 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 Etiology uncertain. This appearance may be seen with prior granulomatous type infection or sarcoidosis. Note absence of adenopathy. This appearance also may be due to atypical allergic type response. Stability over time suggests benign etiology. No new opacity evident. No consolidation. No adenopathy. Heart size normal. Electronically Signed   By: Lowella Grip III M.D.   On: 11/01/2017 16:06   Ct Angio Chest Pe W Or Wo Contrast  Result Date: 11/01/2017 CLINICAL DATA:  Chest pain for 2 weeks. PE suspected, low pretest prob EXAM: CT ANGIOGRAPHY CHEST WITH CONTRAST TECHNIQUE: Multidetector CT imaging of the chest was performed using the standard protocol during bolus administration of intravenous contrast. Multiplanar CT image reconstructions and MIPs were obtained to evaluate the vascular anatomy. CONTRAST:  50 cc ISOVUE-370 IOPAMIDOL (ISOVUE-370) INJECTION 76% COMPARISON:  Chest radiograph earlier this day, and 11/21/2015 FINDINGS: Cardiovascular: There are no filling defects within the pulmonary arteries to suggest pulmonary embolus. The heart is normal in size. No pericardial effusion. Mediastinum/Nodes: No enlarged mediastinal or hilar lymph nodes. No axillary adenopathy. Visualized thyroid gland is normal. The esophagus is decompressed. Lungs/Pleura: Bilateral small nodular opacities throughout both lungs. There may be slight perihilar distribution, no apical to basilar gradient. No definite nodular calcification. No confluent airspace disease. No pulmonary edema. No pleural fluid. Trachea and mainstem bronchi are patent. Upper Abdomen: No acute abnormality. Musculoskeletal: There are no acute or  suspicious osseous abnormalities. Review of the MIP images confirms the above findings. IMPRESSION: 1. No pulmonary embolus. 2. Multiple small bilateral pulmonary nodules, chronic and similar to prior radiographs dating back to 2017. Etiology is uncertain, slight perihilar distribution can be seen in the setting of sarcoidosis, however no associated adenopathy. Sequela of prior infectious or inflammatory process is also considered. Electronically Signed   By: Jeb Levering M.D.   On: 11/01/2017 22:19     Sela Hilding, MD 11/02/2017, 3:11 AM PGY-2, [place] Intern pager: [phone], text pages welcome

## 2017-11-02 NOTE — Progress Notes (Signed)
Subjective:  On entering the room, the patient was lying in the bed on her side with her eye is closed.  She would not open her eyes or look at me when I talked to her. Would answer only with one-word answers.  Mother was also in the room.  No complaints of chest pain or shortness of breath.  Objective:  Vital Signs in the last 24 hours: BP 116/82   Pulse (!) 110   Temp 98.7 F (37.1 C) (Oral)   Resp 17   Ht 5\' 3"  (1.6 m)   Wt 121.1 kg (267 lb)   SpO2 100%   BMI 47.30 kg/m   Physical Exam: Severely obese black female in no acute distress Lungs:  Clear Cardiac:  Regular rhythm, normal S1 and S2, no S3 Extremities:  No edema present  Intake/Output from previous day: 03/01 0701 - 03/02 0700 In: 138.9 [I.V.:138.9] Out: -   Weight Filed Weights   11/01/17 1529 11/01/17 2011 11/02/17 0413  Weight: 133.8 kg (295 lb) 120.4 kg (265 lb 6.4 oz) 121.1 kg (267 lb)    Lab Results: Basic Metabolic Panel: Recent Labs    11/01/17 1530 11/02/17 0706  NA 128* 131*  K 3.9 3.9  CL 92* 97*  CO2 22 21*  GLUCOSE 511* 387*  BUN 10 11  CREATININE 0.71 0.68   CBC: Recent Labs    11/02/17 0015 11/02/17 0706  WBC 8.7 7.9  HGB 12.2 12.7  HCT 35.2* 36.7  MCV 88.0 88.2  PLT 328 312   Cardiac Enzymes: Troponin (Point of Care Test) Recent Labs    11/01/17 1550  TROPIPOC 11.35*   Cardiac Panel (last 3 results) Recent Labs    11/01/17 2054 11/02/17 0015 11/02/17 0706  TROPONINI 13.74* 14.08* 9.28*    Telemetry: Sinus tachycardia  Assessment/Plan:  1.  Chest discomfort with elevated troponins question is whether this is myopericarditis or cardiac ischemia with non-STEMI 2.  Severe morbid obesity 3.  Poorly controlled diabetes 4.  Hypertension  Recommendations:  Echo pending and will be done later today.  Catheterization planned for Monday.     Kerry Hough  MD [facility] Cardiology  11/02/2017, 11:06 AM

## 2017-11-02 NOTE — Progress Notes (Signed)
Inpatient Diabetes Program Recommendations  AACE/ADA: New Consensus Statement on Inpatient Glycemic Control (2015)  Target Ranges:  Prepandial:   less than 140 mg/dL      Peak postprandial:   less than 180 mg/dL (1-2 hours)      Critically ill patients:  140 - 180 mg/dL   Lab Results  Component Value Date   GLUCAP 360 (H) 11/02/2017   HGBA1C 14.6 (H) 11/01/2017    Review of Glycemic Control Results for Lauren Delgado, Lauren Delgado (MRN [id number]) as of 11/02/2017 08:56  Ref. Range 11/01/2017 20:28 11/02/2017 07:37  Glucose-Capillary Latest Ref Range: 65 - 99 mg/dL 313 (H) 360 (H)   Diabetes history: Type 2 DM Outpatient Diabetes medications: Metformin 1,000 mg BID, Glyburide 2.5 mg BID Current orders for Inpatient glycemic control: Novolog 0-15 Units TID, Lantus 10 Units QHS, Invokana 100 mg QD  Inpatient Diabetes Program Recommendations:    Patient has no insurance and A1C 14.6%. Invokana is expensive as outpt. Given patient will need insulin discharge, best option may be Novolin 70/30. Based on weight and current BS of 360 mg/dL would recommend increasing Lantus to 20 Units QHS, starting Novolog 5 units TIDAC and novolog 0-5 QHS. Text page sent.  Spoke with patient regarding home regimen. Patient admits to not taking oral medications that were prescribed. She states, " Finances were kinda an issue and so I just didn't take it." Reviewed patient's current A1c of 14.6%. Explained what a A1c is and what it measures. Also reviewed goal A1c with patient, importance of good glucose control @ home, and blood sugar goals. Discussed patho behind DM, vascular changes that occur from hyperglycemia, changes to her pancreas and need for insulin in her body. Stressed the importance of taking daily injections to prevent long term co-morbidities. Discussed survival skills and sick day management. Patient does not have a meter and has not been checking BS. Blood glucose meter kit (includes lancets and strips)  ([id number]) Talked about Novolin 70/30 at [facility] and the Relion meter. Discussed that she could afford $25/vial and her mother and her would work towards self injections. Mother lives right down the street from the patient. Encouraged patient to begin self injections inpatient. Placed RN care instruction to begin helping.  Patient has no further questions at this time.  Thanks, Bronson Curb, MSN, RNC-OB Diabetes Coordinator [phone] (8a-5p)

## 2017-11-02 NOTE — Care Management Note (Addendum)
Case Management Note  Patient Details  Name: Lauren Delgado MRN: [id number] Date of Birth: 07-09-1986  Subjective/Objective: Pt presented for Chest Pain. Hx of Type 2 Diabetes. Diabetes Coordinator did call the patient in regards to recommendations. PCP will be with Family Medicine and per MD scheduled appointment will be placed on AVS once d/c completed.                Action/Plan: Since patient is without Insurance- pt will need to go to [facility] for generic medications and a glucose meter Relion Brand/ 70/30 Insulin until she has an appointment with Family Medicine. Pt may qualify for the [facility] and be able to get assistance with the Health Department once goes to scheduled PCP appointment. Per mother patient works- daughter asleep at time of visit and not willing to talk- she wanted to sleep. No further needs from CM at this time.    Expected Discharge Date:                  Expected Discharge Plan:  Home/Self Care  In-House Referral:  NA  Discharge planning Services  CM Consult, [facility], Other - See comment(Family Medicine Consulting- Hopefully Office to provide PCP Services. )  Post Acute Care Choice:  NA Choice offered to:  NA  DME Arranged:  N/A DME Agency:  NA  HH Arranged:  NA HH Agency:  NA  Status of Service:  Completed, signed off  If discussed at [place] of Stay Meetings, dates discussed:    Additional Comments: 1224 11-04-17 Jacqlyn Krauss, RN,BSN [phone] Pt's mom did call the [place] Number and pt will be ok to use the 30 day free card. Pt is aware to fill out the [place] application and get it faxed to see if she is approved for medication. No further needs at this time.    1107 11-04-17 Jacqlyn Krauss, RN,BSN [phone] CM will provide pt with the [place] Card and place the patient assistance application on the front of chart for MD to fill out. Staff RN will need to provide the patient with the filled out  application. Pt needs to call the Support Number on the back of the [place]. Pt will need to speak with the SW at the [facility] to see if pt is eligible for the orange card. No further needs from CM at this time.  Bethena Roys, RN 11/02/2017, 11:52 AM

## 2017-11-02 NOTE — Progress Notes (Signed)
ANTICOAGULATION CONSULT NOTE - Follow Up Consult  Pharmacy Consult for heparin Indication: chest pain/ACS  No Known Allergies  Patient Measurements: Height: 5\' 3"  (160 cm) Weight: 267 lb (121.1 kg) IBW/kg (Calculated) : 52.4 Heparin Dosing Weight: 84 kg  Vital Signs: Temp: 98.7 F (37.1 C) (03/02 0413) Temp Source: Oral (03/02 0413) BP: 116/82 (03/02 1011) Pulse Rate: 102 (03/02 1011)  Labs: Recent Labs    11/01/17 1530 11/01/17 2054 11/02/17 0015 11/02/17 0706 11/02/17 1427  HGB 13.5  --  12.2 12.7  --   HCT 39.0  --  35.2* 36.7  --   PLT 308  --  328 312  --   HEPARINUNFRC  --   --  <0.10* 0.15* 0.33  CREATININE 0.71  --   --  0.68  --   TROPONINI  --  13.74* 14.08* 9.28*  --     Estimated Creatinine Clearance: 129.7 mL/min (by C-G formula based on SCr of 0.68 mg/dL).   Medications:  Scheduled:  . aspirin EC  81 mg Oral Daily  . atorvastatin  80 mg Oral QHS  . [START ON 11/03/2017] canagliflozin  100 mg Oral QAC breakfast  . clopidogrel  75 mg Oral Daily  . insulin aspart  0-15 Units Subcutaneous TID WC  . insulin glargine  10 Units Subcutaneous QHS  . metoprolol tartrate  5 mg Intravenous Q6H  . potassium chloride  20 mEq Oral Daily   Infusions:  . heparin 1,700 Units/hr (11/02/17 1200)    Assessment: 78 YOF with morbid obesity on heparin for NSTEMI, cardiology planning for cath on Monday. Pharmacy consulted for heparin for anticoagulation.  Heparin level this afternoon resulted as therapeutic after a rate increase earlier today (HL 0.33 << 0.15, goal of 0.3-0.7). CBC stable from AM labs  Goal of Therapy:  Heparin level 0.3-0.7 units/ml Monitor platelets by anticoagulation protocol: Yes   Plan:  - Continue Heparin at 1700 units/hr (17 ml/hr) - Will continue to monitor for any signs/symptoms of bleeding and will follow up with heparin level in 6 hours to confirm  Thank you for allowing pharmacy to be a part of this patient's care.  Alycia Rossetti, PharmD, BCPS Clinical Pharmacist Pager: [phone] If after 3:30p, please call main pharmacy at: [phone] 11/02/2017 3:27 PM

## 2017-11-02 NOTE — Progress Notes (Signed)
ANTICOAGULATION CONSULT NOTE - Follow Up Consult  Pharmacy Consult for heparin Indication: chest pain/ACS  No Known Allergies  Patient Measurements: Height: 5\' 3"  (160 cm) Weight: 267 lb (121.1 kg) IBW/kg (Calculated) : 52.4 Heparin Dosing Weight: 84 kg  Vital Signs: Temp: 97.8 F (36.6 C) (03/02 2011) Temp Source: Oral (03/02 2011) BP: 142/108 (03/02 2011) Pulse Rate: 100 (03/02 2011)  Labs: Recent Labs    11/01/17 1530 11/01/17 2054  11/02/17 0015 11/02/17 0706 11/02/17 1427 11/02/17 2047  HGB 13.5  --   --  12.2 12.7  --   --   HCT 39.0  --   --  35.2* 36.7  --   --   PLT 308  --   --  328 312  --   --   HEPARINUNFRC  --   --    < > <0.10* 0.15* 0.33 0.26*  CREATININE 0.71  --   --   --  0.68  --   --   TROPONINI  --  13.74*  --  14.08* 9.28*  --   --    < > = values in this interval not displayed.    Estimated Creatinine Clearance: 129.7 mL/min (by C-G formula based on SCr of 0.68 mg/dL).   Medications:  Scheduled:  . aspirin EC  81 mg Oral Daily  . atorvastatin  80 mg Oral QHS  . [START ON 11/03/2017] canagliflozin  100 mg Oral QAC breakfast  . clopidogrel  75 mg Oral Daily  . insulin aspart  0-15 Units Subcutaneous TID WC  . insulin glargine  10 Units Subcutaneous QHS  . metoprolol tartrate  5 mg Intravenous Q6H  . potassium chloride  20 mEq Oral Daily   Infusions:  . heparin 1,700 Units/hr (11/02/17 1200)    Assessment: 12 YOF with morbid obesity on heparin for NSTEMI, cardiology planning for cath on Monday. Pharmacy consulted for heparin for anticoagulation.  Heparin level now subtherapeutic on 1700 units/hr.  No IV issues noted.  CBC stable from AM labs  Goal of Therapy:  Heparin level 0.3-0.7 units/ml Monitor platelets by anticoagulation protocol: Yes   Plan:  Increase heparin to 1900 units/hr. Next heparin level with AM labs.  [employer], Pharm.D., BCPS Clinical Pharmacist 11/02/2017 9:50 PM

## 2017-11-02 NOTE — Progress Notes (Signed)
[facility] for heparin Indication: chest pain/ACS  No Known Allergies  Patient Measurements: Height: 5\' 3"  (160 cm) Weight: 265 lb 6.4 oz (120.4 kg) IBW/kg (Calculated) : 52.4 Heparin Dosing Weight: 84 kg   Vital Signs: Temp: 98 F (36.7 C) (03/02 0002) Temp Source: Oral (03/02 0002) BP: 127/70 (03/02 0002) Pulse Rate: 103 (03/02 0002)  Labs: Recent Labs    11/01/17 1530 11/01/17 2054 11/02/17 0015  HGB 13.5  --  12.2  HCT 39.0  --  35.2*  PLT 308  --  328  HEPARINUNFRC  --   --  <0.10*  CREATININE 0.71  --   --   TROPONINI  --  13.74*  --     Estimated Creatinine Clearance: 129.2 mL/min (by C-G formula based on SCr of 0.71 mg/dL).   Medical History: Past Medical History:  Diagnosis Date  . Gestational diabetes mellitus, antepartum 2015  . Hypertension     Medications:  Scheduled:  . aspirin EC  81 mg Oral Daily  . atorvastatin  80 mg Oral QHS  . insulin aspart  0-15 Units Subcutaneous TID WC  . insulin glargine  10 Units Subcutaneous QHS  . metoprolol tartrate  12.5 mg Oral BID    Assessment: 2 yof presenting with 2 weeks of intermittent chest discomfort and SOB. Also presenting with cold-like symptoms (congestion, cough, chills). Has strong family cardiac history. No anticoagulation prior to admission.  Initial heparin level undetectable, no issues noted. Hgb down slightly to 12.2.  Goal of Therapy:  Heparin level 0.3-0.7 units/ml Monitor platelets by anticoagulation protocol: Yes   Plan:  Give 2000 units bolus x 1 Increase heparin infusion to 1400 units/hr Check anti-Xa level in 6 hours and daily while on heparin Continue to monitor H&H and platelets  Erin Hearing PharmD., BCPS Clinical Pharmacist 11/02/2017 1:42 AM

## 2017-11-03 DIAGNOSIS — I429 Cardiomyopathy, unspecified: Secondary | ICD-10-CM

## 2017-11-03 LAB — BASIC METABOLIC PANEL
Anion gap: 11 (ref 5–15)
BUN: 14 mg/dL (ref 6–20)
CO2: 21 mmol/L — ABNORMAL LOW (ref 22–32)
Calcium: 8.5 mg/dL — ABNORMAL LOW (ref 8.9–10.3)
Chloride: 100 mmol/L — ABNORMAL LOW (ref 101–111)
Creatinine, Ser: 0.74 mg/dL (ref 0.44–1.00)
GFR calc Af Amer: >60 mL/min (ref >60)
GFR calc non Af Amer: >60 mL/min (ref >60)
Glucose, Bld: 454 mg/dL — ABNORMAL HIGH (ref 65–99)
Potassium: 3.7 mmol/L (ref 3.5–5.1)
Sodium: 132 mmol/L — ABNORMAL LOW (ref 135–145)

## 2017-11-03 LAB — GLUCOSE, CAPILLARY
Glucose-Capillary: 394 mg/dL — ABNORMAL HIGH (ref 65–99)
Glucose-Capillary: 315 mg/dL — ABNORMAL HIGH (ref 65–99)
Glucose-Capillary: 236 mg/dL — ABNORMAL HIGH (ref 65–99)
Glucose-Capillary: 290 mg/dL — ABNORMAL HIGH (ref 65–99)

## 2017-11-03 LAB — CBC
HCT: 33.5 % — ABNORMAL LOW (ref 36.0–46.0)
Hemoglobin: 11.4 g/dL — ABNORMAL LOW (ref 12.0–15.0)
MCH: 30.5 pg (ref 26.0–34.0)
MCHC: 34 g/dL (ref 30.0–36.0)
MCV: 89.6 fL (ref 78.0–100.0)
Platelets: 296 10*3/uL (ref 150–400)
RBC: 3.74 MIL/uL — ABNORMAL LOW (ref 3.87–5.11)
RDW: 12.2 % (ref 11.5–15.5)
WBC: 7.1 10*3/uL (ref 4.0–10.5)

## 2017-11-03 LAB — TSH: TSH: 1.898 u[IU]/mL (ref 0.350–4.500)

## 2017-11-03 LAB — HEPARIN LEVEL (UNFRACTIONATED): Heparin Unfractionated: 0.48 IU/mL (ref 0.30–0.70)

## 2017-11-03 LAB — MAGNESIUM: Magnesium: 1.8 mg/dL (ref 1.7–2.4)

## 2017-11-03 MED ORDER — ASPIRIN EC 81 MG PO TBEC: 81.0000 mg | DELAYED_RELEASE_TABLET | Freq: Every day | ORAL | Status: DC

## 2017-11-03 MED ORDER — SODIUM CHLORIDE 0.9 % WEIGHT BASED INFUSION
1.0000 mL/kg/h | INTRAVENOUS | Status: DC
  Administered 2017-11-04: 1 mL/kg/h via INTRAVENOUS

## 2017-11-03 MED ORDER — SODIUM CHLORIDE 0.9% FLUSH
3.0000 mL | Freq: Two times a day (BID) | INTRAVENOUS | Status: DC
  Administered 2017-11-03: 3 mL via INTRAVENOUS

## 2017-11-03 MED ORDER — SODIUM CHLORIDE 0.9% FLUSH: 3.0000 mL | INTRAVENOUS | Status: DC | PRN

## 2017-11-03 MED ORDER — METOPROLOL SUCCINATE ER 25 MG PO TB24
25.0000 mg | ORAL_TABLET | Freq: Every day | ORAL | Status: DC
  Administered 2017-11-03 – 2017-11-05 (×3): 25 mg via ORAL
  Filled 2017-11-03 (×3): qty 1

## 2017-11-03 MED ORDER — ASPIRIN 81 MG PO CHEW
81.0000 mg | CHEWABLE_TABLET | ORAL | Status: AC
  Administered 2017-11-04: 81 mg via ORAL
  Filled 2017-11-03: qty 1

## 2017-11-03 MED ORDER — SODIUM CHLORIDE 0.9 % WEIGHT BASED INFUSION
3.0000 mL/kg/h | INTRAVENOUS | Status: DC
  Administered 2017-11-04: 3 mL/kg/h via INTRAVENOUS

## 2017-11-03 MED ORDER — SODIUM CHLORIDE 0.9 % IV SOLN: 250.0000 mL | INTRAVENOUS | Status: DC | PRN

## 2017-11-03 MED ORDER — HYDRALAZINE HCL 25 MG PO TABS
25.0000 mg | ORAL_TABLET | Freq: Three times a day (TID) | ORAL | Status: DC
  Administered 2017-11-03 – 2017-11-05 (×7): 25 mg via ORAL
  Filled 2017-11-03 (×7): qty 1

## 2017-11-03 NOTE — Progress Notes (Deleted)
Subjective:  No complaints of chest pain or shortness of breath.  Objective:  Vital Signs in the last 24 hours: BP 122/78 (BP Location: Right Arm)   Pulse (!) 101   Temp 98.7 F (37.1 C) (Oral)   Resp (!) 21   Ht 5\' 3"  (1.6 m)   Wt 122.2 kg (269 lb 4.8 oz)   SpO2 100%   BMI 47.70 kg/m   Physical Exam: Severely obese black female in no acute distress Lungs:  Clear Cardiac:  Regular rhythm, normal S1 and S2, no S3 Extremities:  No edema present  Intake/Output from previous day: 03/02 0701 - 03/03 0700 In: 480 [P.O.:480] Out: -   Weight Filed Weights   11/01/17 2011 11/02/17 0413 11/03/17 0449  Weight: 120.4 kg (265 lb 6.4 oz) 121.1 kg (267 lb) 122.2 kg (269 lb 4.8 oz)    Lab Results: Basic Metabolic Panel: Recent Labs    11/01/17 1530 11/02/17 0706  NA 128* 131*  K 3.9 3.9  CL 92* 97*  CO2 22 21*  GLUCOSE 511* 387*  BUN 10 11  CREATININE 0.71 0.68   CBC: Recent Labs    11/02/17 0015 11/02/17 0706  WBC 8.7 7.9  HGB 12.2 12.7  HCT 35.2* 36.7  MCV 88.0 88.2  PLT 328 312   Cardiac Enzymes: Troponin (Point of Care Test) Recent Labs    11/01/17 1550  TROPIPOC 11.35*   Cardiac Panel (last 3 results) Recent Labs    11/01/17 2054 11/02/17 0015 11/02/17 0706  TROPONINI 13.74* 14.08* 9.28*    Telemetry: Sinus tachycardia  Echo: EF 35-40%.  Regional wall motion abnormalities noted suggestive of LAD distribution.  Assessment/Plan:  1.  Chest discomfort with elevated troponins question is whether this is myopericarditis or cardiac ischemia with non-STEMI-EKG as regional wall motion abnormalities but this could be myopericarditis. 2.  Severe morbid obesity 3.  Poorly controlled diabetes 4.  Hypertension  Recommendations:  .  Catheterization planned for Monday.  Cardiac catheterization was discussed with the patient fully including risks of myocardial infarction, death, stroke, bleeding, arrhythmia, dye allergy, renal insufficiency or bleeding.   The patient understands and is willing to proceed.  Possibility of intervention at the same time also discussed with patient and mother and  they understand and are agreeable to proceed     W. Doristine Church  MD [facility] Cardiology  11/03/2017, 9:22 AM

## 2017-11-03 NOTE — Progress Notes (Addendum)
Subjective:  No complaints of chest pain or shortness of breath.  Objective:  Vital Signs in the last 24 hours: BP 122/78 (BP Location: Right Arm)   Pulse (!) 101   Temp 98.7 F (37.1 C) (Oral)   Resp (!) 21   Ht 5\' 3"  (1.6 m)   Wt 122.2 kg (269 lb 4.8 oz)   SpO2 100%   BMI 47.70 kg/m   Physical Exam: Severely obese black female in no acute distress Lungs:  Clear Cardiac:  Regular rhythm, normal S1 and S2, no S3 Extremities:  No edema present  Intake/Output from previous day: 03/02 0701 - 03/03 0700 In: 480 [P.O.:480] Out: -   Weight Filed Weights   11/01/17 2011 11/02/17 0413 11/03/17 0449  Weight: 120.4 kg (265 lb 6.4 oz) 121.1 kg (267 lb) 122.2 kg (269 lb 4.8 oz)    Lab Results: Basic Metabolic Panel: Recent Labs    11/01/17 1530 11/02/17 0706  NA 128* 131*  K 3.9 3.9  CL 92* 97*  CO2 22 21*  GLUCOSE 511* 387*  BUN 10 11  CREATININE 0.71 0.68   CBC: Recent Labs    11/02/17 0015 11/02/17 0706  WBC 8.7 7.9  HGB 12.2 12.7  HCT 35.2* 36.7  MCV 88.0 88.2  PLT 328 312   Cardiac Enzymes: Troponin (Point of Care Test) Recent Labs    11/01/17 1550  TROPIPOC 11.35*   Cardiac Panel (last 3 results) Recent Labs    11/01/17 2054 11/02/17 0015 11/02/17 0706  TROPONINI 13.74* 14.08* 9.28*    Telemetry: Sinus tachycardia  Echo: EF 35-40%.  Regional wall motion abnormalities noted suggestive of LAD distribution.  Assessment/Plan:  1.  Chest discomfort with elevated troponins question is whether this is myopericarditis or cardiac ischemia with non-STEMI 2.  Severe morbid obesity 3.  Poorly controlled diabetes 4.  Hypertension  Recommendations:  Catheterization planned for Monday.Cardiac catheterization was discussed with the patient fully including risks of myocardial infarction, death, stroke, bleeding, arrhythmia, dye allergy, renal insufficiency or bleeding.  The patient understands and is willing to proceed.  Possibility of intervention at  the same time also discussed with patient and mother and they understand and are agreeable to proceed.  Will add hydralazine for reduced LV EF and add metoprolol succinate.  Eventually will need entresto after cath done.      Kerry Hough  MD [facility] Cardiology  11/03/2017, 8:53 AM

## 2017-11-03 NOTE — H&P (View-Only) (Signed)
Subjective:  No complaints of chest pain or shortness of breath.  Objective:  Vital Signs in the last 24 hours: BP 122/78 (BP Location: Right Arm)   Pulse (!) 101   Temp 98.7 F (37.1 C) (Oral)   Resp (!) 21   Ht 5\' 3"  (1.6 m)   Wt 122.2 kg (269 lb 4.8 oz)   SpO2 100%   BMI 47.70 kg/m   Physical Exam: Severely obese black female in no acute distress Lungs:  Clear Cardiac:  Regular rhythm, normal S1 and S2, no S3 Extremities:  No edema present  Intake/Output from previous day: 03/02 0701 - 03/03 0700 In: 480 [P.O.:480] Out: -   Weight Filed Weights   11/01/17 2011 11/02/17 0413 11/03/17 0449  Weight: 120.4 kg (265 lb 6.4 oz) 121.1 kg (267 lb) 122.2 kg (269 lb 4.8 oz)    Lab Results: Basic Metabolic Panel: Recent Labs    11/01/17 1530 11/02/17 0706  NA 128* 131*  K 3.9 3.9  CL 92* 97*  CO2 22 21*  GLUCOSE 511* 387*  BUN 10 11  CREATININE 0.71 0.68   CBC: Recent Labs    11/02/17 0015 11/02/17 0706  WBC 8.7 7.9  HGB 12.2 12.7  HCT 35.2* 36.7  MCV 88.0 88.2  PLT 328 312   Cardiac Enzymes: Troponin (Point of Care Test) Recent Labs    11/01/17 1550  TROPIPOC 11.35*   Cardiac Panel (last 3 results) Recent Labs    11/01/17 2054 11/02/17 0015 11/02/17 0706  TROPONINI 13.74* 14.08* 9.28*    Telemetry: Sinus tachycardia  Echo: EF 35-40%.  Regional wall motion abnormalities noted suggestive of LAD distribution.  Assessment/Plan:  1.  Chest discomfort with elevated troponins question is whether this is myopericarditis or cardiac ischemia with non-STEMI 2.  Severe morbid obesity 3.  Poorly controlled diabetes 4.  Hypertension  Recommendations:  Catheterization planned for Monday.Cardiac catheterization was discussed with the patient fully including risks of myocardial infarction, death, stroke, bleeding, arrhythmia, dye allergy, renal insufficiency or bleeding.  The patient understands and is willing to proceed.  Possibility of intervention at  the same time also discussed with patient and mother and they understand and are agreeable to proceed.  Will add hydralazine for reduced LV EF and add metoprolol succinate.  Eventually will need entresto after cath done.      Kerry Hough  MD [facility] Cardiology  11/03/2017, 8:53 AM

## 2017-11-03 NOTE — Progress Notes (Signed)
ANTICOAGULATION CONSULT NOTE - Follow Up Consult  Pharmacy Consult for heparin Indication: chest pain/ACS  No Known Allergies  Patient Measurements: Height: 5\' 3"  (160 cm) Weight: 269 lb 4.8 oz (122.2 kg) IBW/kg (Calculated) : 52.4 Heparin Dosing Weight: 84 kg  Vital Signs: Temp: 98.7 F (37.1 C) (03/03 0449) Temp Source: Oral (03/03 0449) BP: 122/78 (03/03 0449) Pulse Rate: 101 (03/03 0449)  Labs: Recent Labs    11/01/17 1530 11/01/17 2054  11/02/17 0015 11/02/17 0706 11/02/17 1427 11/02/17 2047 11/03/17 0904  HGB 13.5  --   --  12.2 12.7  --   --  11.4*  HCT 39.0  --   --  35.2* 36.7  --   --  33.5*  PLT 308  --   --  328 312  --   --  296  HEPARINUNFRC  --   --    < > <0.10* 0.15* 0.33 0.26* 0.48  CREATININE 0.71  --   --   --  0.68  --   --   --   TROPONINI  --  13.74*  --  14.08* 9.28*  --   --   --    < > = values in this interval not displayed.    Estimated Creatinine Clearance: 130.3 mL/min (by C-G formula based on SCr of 0.68 mg/dL).   Medications:  Scheduled:  . aspirin EC  81 mg Oral Daily  . atorvastatin  80 mg Oral QHS  . canagliflozin  100 mg Oral QAC breakfast  . clopidogrel  75 mg Oral Daily  . hydrALAZINE  25 mg Oral Q8H  . insulin aspart  0-15 Units Subcutaneous TID WC  . insulin glargine  10 Units Subcutaneous QHS  . metoprolol succinate  25 mg Oral Daily  . potassium chloride  20 mEq Oral Daily   Infusions:  . heparin 1,900 Units/hr (11/03/17 0509)    Assessment: 71 YOF with morbid obesity on heparin for NSTEMI, cardiology planning for cath on Monday. Heparin level today is at goal 0.48 after rate increase last night. Hgb is trending down 13.5>12.7>11.4, platelets are stable and wnl. No bleeding noted.   Goal of Therapy:  Heparin level 0.3-0.7 units/ml Monitor platelets by anticoagulation protocol: Yes   Plan:  Continue heparin 1900 units/hr Daily heparin level, CBC Monitor s/sx of bleeding Follow up cardiology plans Monday  post-cath   Charlene Brooke, PharmD PGY1 Pharmacy Resident Phone: [phone] After 3:30PM please call Main Pharmacy [phone] 11/03/2017,9:59 AM

## 2017-11-04 ENCOUNTER — Encounter (HOSPITAL_COMMUNITY): Payer: Self-pay | Admitting: Interventional Cardiology

## 2017-11-04 ENCOUNTER — Encounter (HOSPITAL_COMMUNITY)
Admission: EM | Disposition: A | Discharge status: Home / Self Care | Payer: Self-pay | Source: Home / Self Care | Attending: Family Medicine

## 2017-11-04 DIAGNOSIS — E1169 Type 2 diabetes mellitus with other specified complication: Secondary | ICD-10-CM

## 2017-11-04 DIAGNOSIS — E8881 Metabolic syndrome: Secondary | ICD-10-CM

## 2017-11-04 DIAGNOSIS — E785 Hyperlipidemia, unspecified: Secondary | ICD-10-CM

## 2017-11-04 DIAGNOSIS — I2510 Atherosclerotic heart disease of native coronary artery without angina pectoris: Secondary | ICD-10-CM

## 2017-11-04 DIAGNOSIS — I119 Hypertensive heart disease without heart failure: Secondary | ICD-10-CM

## 2017-11-04 HISTORY — PX: LEFT HEART CATH AND CORONARY ANGIOGRAPHY: CATH118249

## 2017-11-04 HISTORY — PX: CORONARY STENT INTERVENTION: CATH118234

## 2017-11-04 LAB — CBC
HCT: 37.1 % (ref 36.0–46.0)
Hemoglobin: 12.5 g/dL (ref 12.0–15.0)
MCH: 30.6 pg (ref 26.0–34.0)
MCHC: 33.7 g/dL (ref 30.0–36.0)
MCV: 90.7 fL (ref 78.0–100.0)
Platelets: 320 10*3/uL (ref 150–400)
RBC: 4.09 MIL/uL (ref 3.87–5.11)
RDW: 12.5 % (ref 11.5–15.5)
WBC: 8.8 10*3/uL (ref 4.0–10.5)

## 2017-11-04 LAB — BASIC METABOLIC PANEL
Anion gap: 10 (ref 5–15)
BUN: 17 mg/dL (ref 6–20)
CO2: 24 mmol/L (ref 22–32)
Calcium: 9.1 mg/dL (ref 8.9–10.3)
Chloride: 103 mmol/L (ref 101–111)
Creatinine, Ser: 0.71 mg/dL (ref 0.44–1.00)
GFR calc Af Amer: >60 mL/min (ref >60)
GFR calc non Af Amer: >60 mL/min (ref >60)
Glucose, Bld: 271 mg/dL — ABNORMAL HIGH (ref 65–99)
Potassium: 4 mmol/L (ref 3.5–5.1)
Sodium: 137 mmol/L (ref 135–145)

## 2017-11-04 LAB — GLUCOSE, CAPILLARY
Glucose-Capillary: 258 mg/dL — ABNORMAL HIGH (ref 65–99)
Glucose-Capillary: 266 mg/dL — ABNORMAL HIGH (ref 65–99)
Glucose-Capillary: 259 mg/dL — ABNORMAL HIGH (ref 65–99)
Glucose-Capillary: 327 mg/dL — ABNORMAL HIGH (ref 65–99)

## 2017-11-04 LAB — POCT ACTIVATED CLOTTING TIME
Activated Clotting Time: 334 seconds
Activated Clotting Time: 307 seconds
Activated Clotting Time: 329 seconds
Activated Clotting Time: 241 seconds

## 2017-11-04 LAB — PROTIME-INR
INR: 1.05
Prothrombin Time: 13.6 seconds (ref 11.4–15.2)

## 2017-11-04 LAB — HEPARIN LEVEL (UNFRACTIONATED): Heparin Unfractionated: 0.64 IU/mL (ref 0.30–0.70)

## 2017-11-04 SURGERY — LEFT HEART CATH AND CORONARY ANGIOGRAPHY
Anesthesia: LOCAL

## 2017-11-04 MED ORDER — SODIUM CHLORIDE 0.9 % IV SOLN: 250.0000 mL | INTRAVENOUS | Status: DC | PRN

## 2017-11-04 MED ORDER — FENTANYL CITRATE (PF) 100 MCG/2ML IJ SOLN
INTRAMUSCULAR | Status: AC
  Filled 2017-11-04: qty 2

## 2017-11-04 MED ORDER — MIDAZOLAM HCL 2 MG/2ML IJ SOLN
INTRAMUSCULAR | Status: AC
  Filled 2017-11-04: qty 2

## 2017-11-04 MED ORDER — SODIUM CHLORIDE 0.9 % IV SOLN
INTRAVENOUS | Status: AC
  Administered 2017-11-04 (×2): via INTRAVENOUS

## 2017-11-04 MED ORDER — MIDAZOLAM HCL 2 MG/2ML IJ SOLN
INTRAMUSCULAR | Status: DC | PRN
  Administered 2017-11-04: 2 mg via INTRAVENOUS
  Administered 2017-11-04 (×2): 1 mg via INTRAVENOUS

## 2017-11-04 MED ORDER — TIROFIBAN HCL IN NACL 5-0.9 MG/100ML-% IV SOLN
INTRAVENOUS | Status: AC | PRN
  Administered 2017-11-04: 0.15 ug/kg/min via INTRAVENOUS

## 2017-11-04 MED ORDER — LABETALOL HCL 5 MG/ML IV SOLN: 10.0000 mg | INTRAVENOUS | Status: AC | PRN

## 2017-11-04 MED ORDER — VERAPAMIL HCL 2.5 MG/ML IV SOLN
INTRAVENOUS | Status: AC
  Filled 2017-11-04: qty 2

## 2017-11-04 MED ORDER — ASPIRIN 81 MG PO CHEW
81.0000 mg | CHEWABLE_TABLET | Freq: Every day | ORAL | Status: DC
  Administered 2017-11-05: 81 mg via ORAL
  Filled 2017-11-04: qty 1

## 2017-11-04 MED ORDER — TICAGRELOR 90 MG PO TABS
ORAL_TABLET | ORAL | Status: DC | PRN
  Administered 2017-11-04: 180 mg via ORAL

## 2017-11-04 MED ORDER — INSULIN ASPART 100 UNIT/ML ~~LOC~~ SOLN
0.0000 [IU] | Freq: Three times a day (TID) | SUBCUTANEOUS | Status: DC
  Administered 2017-11-05: 4 [IU] via SUBCUTANEOUS
  Administered 2017-11-05: 20 [IU] via SUBCUTANEOUS

## 2017-11-04 MED ORDER — FENTANYL CITRATE (PF) 100 MCG/2ML IJ SOLN
INTRAMUSCULAR | Status: DC | PRN
  Administered 2017-11-04 (×3): 25 ug via INTRAVENOUS

## 2017-11-04 MED ORDER — TIROFIBAN (AGGRASTAT) BOLUS VIA INFUSION
INTRAVENOUS | Status: DC | PRN
  Administered 2017-11-04: 3055 ug via INTRAVENOUS

## 2017-11-04 MED ORDER — SODIUM CHLORIDE 0.9% FLUSH: 3.0000 mL | INTRAVENOUS | Status: DC | PRN

## 2017-11-04 MED ORDER — HEPARIN SODIUM (PORCINE) 1000 UNIT/ML IJ SOLN
INTRAMUSCULAR | Status: AC
  Filled 2017-11-04: qty 1

## 2017-11-04 MED ORDER — HEPARIN (PORCINE) IN NACL 2-0.9 UNIT/ML-% IJ SOLN
INTRAMUSCULAR | Status: AC | PRN
  Administered 2017-11-04 (×2): 500 mL via INTRA_ARTERIAL

## 2017-11-04 MED ORDER — ACETAMINOPHEN 325 MG PO TABS
650.0000 mg | ORAL_TABLET | ORAL | Status: DC | PRN
  Administered 2017-11-04: 650 mg via ORAL
  Filled 2017-11-04: qty 2

## 2017-11-04 MED ORDER — ANGIOPLASTY BOOK
Freq: Once | Status: AC
  Administered 2017-11-05: 07:00:00
  Filled 2017-11-04: qty 1

## 2017-11-04 MED ORDER — NITROGLYCERIN 1 MG/10 ML FOR IR/CATH LAB
INTRA_ARTERIAL | Status: AC
  Filled 2017-11-04: qty 10

## 2017-11-04 MED ORDER — LIDOCAINE HCL (PF) 1 % IJ SOLN
INTRAMUSCULAR | Status: DC | PRN
  Administered 2017-11-04: 3 mL

## 2017-11-04 MED ORDER — IOPAMIDOL (ISOVUE-370) INJECTION 76%
INTRAVENOUS | Status: DC | PRN
  Administered 2017-11-04: 195 mL via INTRA_ARTERIAL

## 2017-11-04 MED ORDER — NITROGLYCERIN 1 MG/10 ML FOR IR/CATH LAB
INTRA_ARTERIAL | Status: DC | PRN
  Administered 2017-11-04 (×2): 200 ug via INTRACORONARY
  Administered 2017-11-04: 400 ug via INTRA_ARTERIAL
  Administered 2017-11-04: 200 ug via INTRACORONARY

## 2017-11-04 MED ORDER — LIVING WELL WITH DIABETES BOOK
Freq: Once | Status: AC
  Administered 2017-11-05: 07:00:00
  Filled 2017-11-04: qty 1

## 2017-11-04 MED ORDER — TIROFIBAN HCL IN NACL 5-0.9 MG/100ML-% IV SOLN
INTRAVENOUS | Status: AC
  Filled 2017-11-04: qty 100

## 2017-11-04 MED ORDER — HYDRALAZINE HCL 20 MG/ML IJ SOLN: 5.0000 mg | INTRAMUSCULAR | Status: AC | PRN

## 2017-11-04 MED ORDER — INSULIN GLARGINE 100 UNIT/ML ~~LOC~~ SOLN
13.0000 [IU] | Freq: Every day | SUBCUTANEOUS | Status: DC
  Administered 2017-11-04: 23:00:00 13 [IU] via SUBCUTANEOUS
  Filled 2017-11-04 (×2): qty 0.13

## 2017-11-04 MED ORDER — LIDOCAINE HCL (PF) 1 % IJ SOLN
INTRAMUSCULAR | Status: AC
  Filled 2017-11-04: qty 30

## 2017-11-04 MED ORDER — VERAPAMIL HCL 2.5 MG/ML IV SOLN
INTRAVENOUS | Status: DC | PRN
  Administered 2017-11-04 (×2): via INTRA_ARTERIAL

## 2017-11-04 MED ORDER — SODIUM CHLORIDE 0.9% FLUSH
3.0000 mL | Freq: Two times a day (BID) | INTRAVENOUS | Status: DC
  Administered 2017-11-04 (×2): 3 mL via INTRAVENOUS

## 2017-11-04 MED ORDER — IOPAMIDOL (ISOVUE-370) INJECTION 76%
INTRAVENOUS | Status: AC
  Filled 2017-11-04: qty 50

## 2017-11-04 MED ORDER — HEPARIN SODIUM (PORCINE) 1000 UNIT/ML IJ SOLN
INTRAMUSCULAR | Status: DC | PRN
  Administered 2017-11-04: 6000 [IU] via INTRAVENOUS
  Administered 2017-11-04: 3000 [IU] via INTRAVENOUS
  Administered 2017-11-04: 6000 [IU] via INTRAVENOUS

## 2017-11-04 MED ORDER — ONDANSETRON HCL 4 MG/2ML IJ SOLN: 4.0000 mg | Freq: Four times a day (QID) | INTRAMUSCULAR | Status: DC | PRN

## 2017-11-04 MED ORDER — TICAGRELOR 90 MG PO TABS
90.0000 mg | ORAL_TABLET | Freq: Two times a day (BID) | ORAL | Status: DC
  Administered 2017-11-04 – 2017-11-05 (×2): 90 mg via ORAL
  Filled 2017-11-04 (×2): qty 1

## 2017-11-04 MED ORDER — IOPAMIDOL (ISOVUE-370) INJECTION 76%
INTRAVENOUS | Status: AC
  Filled 2017-11-04: qty 100

## 2017-11-04 MED ORDER — HEPARIN (PORCINE) IN NACL 2-0.9 UNIT/ML-% IJ SOLN
INTRAMUSCULAR | Status: AC
  Filled 2017-11-04: qty 1000

## 2017-11-04 SURGICAL SUPPLY — 25 items
BALLN SAPPHIRE 2.5X12 (BALLOONS) ×2
BALLN SAPPHIRE 2.5X20 (BALLOONS) ×2
BALLN SAPPHIRE ~~LOC~~ 3.5X18 (BALLOONS) ×1 IMPLANT
BALLN SAPPHIRE ~~LOC~~ 3.75X10 (BALLOONS) ×1 IMPLANT
BALLOON SAPPHIRE 2.5X12 (BALLOONS) IMPLANT
BALLOON SAPPHIRE 2.5X20 (BALLOONS) IMPLANT
CATH IMPULSE 5F ANG/FL3.5 (CATHETERS) ×1 IMPLANT
CATH LAUNCHER 6FR EBU 3 (CATHETERS) ×1 IMPLANT
CATH LAUNCHER 6FR JR4 (CATHETERS) ×1 IMPLANT
DEVICE RAD COMP TR BAND LRG (VASCULAR PRODUCTS) ×2 IMPLANT
GLIDESHEATH SLEND SS 6F .021 (SHEATH) ×1 IMPLANT
GUIDEWIRE INQWIRE 1.5J.035X260 (WIRE) IMPLANT
INQWIRE 1.5J .035X260CM (WIRE) ×2
KIT ENCORE 26 ADVANTAGE (KITS) ×1 IMPLANT
KIT HEART LEFT (KITS) ×2 IMPLANT
KIT HEMO VALVE WATCHDOG (MISCELLANEOUS) ×1 IMPLANT
PACK CARDIAC CATHETERIZATION (CUSTOM PROCEDURE TRAY) ×2 IMPLANT
STENT SYNERGY DES 2.75X16 (Permanent Stent) ×1 IMPLANT
STENT SYNERGY DES 2.75X8 (Permanent Stent) ×1 IMPLANT
STENT SYNERGY DES 3.5X16 (Permanent Stent) ×1 IMPLANT
STENT SYNERGY DES 3X38 (Permanent Stent) ×1 IMPLANT
TRANSDUCER W/STOPCOCK (MISCELLANEOUS) ×2 IMPLANT
TUBING CIL FLEX 10 FLL-RA (TUBING) ×2 IMPLANT
WIRE ASAHI PROWATER 180CM (WIRE) ×1 IMPLANT
WIRE HI TORQ BMW 190CM (WIRE) ×1 IMPLANT

## 2017-11-04 NOTE — Interval H&P Note (Signed)
Cath Lab Visit (complete for each Cath Lab visit)  Clinical Evaluation Leading to the Procedure:   ACS: Yes.    Non-ACS:    Anginal Classification: CCS IV  Anti-ischemic medical therapy: Minimal Therapy (1 class of medications)  Non-Invasive Test Results: Intermediate-risk stress test findings: cardiac mortality 1-3%/year abnormal echo, EF 35-40%  Prior CABG: No previous CABG      History and Physical Interval Note:  11/04/2017 8:35 AM  Lauren Delgado  has presented today for surgery, with the diagnosis of NSTEMI  The various methods of treatment have been discussed with the patient and family. After consideration of risks, benefits and other options for treatment, the patient has consented to  Procedure(s): LEFT HEART CATH AND CORONARY ANGIOGRAPHY (N/A) as a surgical intervention .  The patient's history has been reviewed, patient examined, no change in status, stable for surgery.  I have reviewed the patient's chart and labs.  Questions were answered to the patient's satisfaction.     Lauren Delgado

## 2017-11-04 NOTE — Progress Notes (Signed)
Patient admitted to unit. TR band in place right radial site. Burman Foster noted hematoma proximal to site and held pressure. Dr. Irish Lack into see patient and family, assess site and review results of procedure. Second TR band applied proximal to first TR band after pressure held and hematoma resolved. No further hematoma noted.

## 2017-11-04 NOTE — Discharge Summary (Signed)
[facility] Discharge Summary  Patient name: Lauren Delgado Medical record number: [id number] Date of birth: 1986-03-05 Age: 32 y.o. Gender: female Date of Admission: 11/01/2017  Date of Discharge: 11/05/17 Admitting Physician: Blane Ohara McDiarmid, MD  Primary Care Provider: Patient, No Pcp Per Consultants: cardiology  Indication for Hospitalization: chest pain/SOB  Discharge Diagnoses/Problem List:  NSTEMI S/p 3 vessel PCI DM2 with insulin use Possible sarcoidosis HLD Morbid obesity Reproductive age  Disposition: to home  Discharge Condition: stable  Discharge Exam: General: obese female lying in bed, drowsy but  in NAD  Cardiovascular: RRR, no murmur Respiratory: CTAB, no wheezes or crackles Extremities: right arm swelling is resolving Skin: warm and dry Psych: appropriate mood, flat affect Neuro: A&Ox3  Brief Hospital Course:  Patient presented with chest pain and SOB to ED and was found to have NSTEMI on ecg and troponin of 11.35.   She was admitted and scheduled for cath resulting in 3 vessel PCI.   She had an uneventful recovery of symptoms after her procedure and was cleared by cardiology for discharge the following day.  Her diabetes has been uncontrolled and she is starting on a new insulin regimen of 13 tresiba and SSI novalog.  She has been helped significantly by case management to arrange coverage for brillinta and the orange card to assist with DM2 medications.  Issues for Follow Up:  1. Patient may benefit from bariatric surgery referral. 2. Outpatient echo in several weeks 3. Patient needs orange card for DM meds assistance, please help coordinate 4. Patient with significant hypercholesterolemia, please track/treat accordingly CM will provide pt with the Brilinta Card and place the patient assistance application on the front of chart for MD to fill out. Staff RN will need to provide the patient with the filled out application. Pt needs  to call the Support Number on the back of the [place]. Pt will need to speak with the SW at the [facility] to see if pt is eligible for the orange card 5. Novolog Rx: 70-120:0, 121-150:3, 151-200:4, 200-250:7, 251-300:11, 301-350:15, 983-382:50, >400 call your doctor 6. Lantus Rx: 13u qhs 7. Imaging and family Hx concerning for sarcoidosis, please evaluate accordingly  Significant Procedures: 3 vessel PCI  Significant Labs and Imaging:  Recent Labs  Lab 11/03/17 0904 11/04/17 0358 11/05/17 0343  WBC 7.1 8.8 9.4  HGB 11.4* 12.5 12.0  HCT 33.5* 37.1 35.1*  PLT 296 320 328   Recent Labs  Lab 11/01/17 1530 11/02/17 0706 11/02/17 0800 11/03/17 0904 11/04/17 0358 11/05/17 0343  NA 128* 131*  --  132* 137 136  K 3.9 3.9  --  3.7 4.0 3.8  CL 92* 97*  --  100* 103 103  CO2 22 21*  --  21* 24 23  GLUCOSE 511* 387*  --  454* 271* 212*  BUN 10 11  --  14 17 14   CREATININE 0.71 0.68  --  0.74 0.71 0.64  CALCIUM 9.7 8.8*  --  8.5* 9.1 9.2  MG  --   --  1.7 1.8  --   --      Results/Tests Pending at Time of Discharge: n/a  Discharge Medications:  Allergies as of 11/05/2017   No Known Allergies     Medication List    STOP taking these medications   glyBURIDE 2.5 MG tablet Commonly known as:  DIABETA   hydrochlorothiazide 12.5 MG tablet Commonly known as:  HYDRODIURIL   ibuprofen 800 MG tablet  Commonly known as:  ADVIL,MOTRIN   methocarbamol 500 MG tablet Commonly known as:  ROBAXIN   traMADol 50 MG tablet Commonly known as:  ULTRAM   valACYclovir 500 MG tablet Commonly known as:  VALTREX     TAKE these medications   aspirin 81 MG chewable tablet Chew 1 tablet (81 mg total) by mouth daily.   atorvastatin 80 MG tablet Commonly known as:  LIPITOR Take 1 tablet (80 mg total) by mouth at bedtime.   hydrALAZINE 25 MG tablet Commonly known as:  APRESOLINE Take 1 tablet (25 mg total) by mouth every 8 (eight) hours.   insulin aspart 100 UNIT/ML  injection Commonly known as:  novoLOG Inject 0-20 Units into the skin 3 (three) times daily with meals. cbg70-120:0, cbg121-150:3, cbg151-200:4, cbg200-250:7, cbg251-300:11, cbg301-350:15, cbg351-400:20, cbg>400 call your doctor   insulin degludec 100 UNIT/ML Sopn FlexTouch Pen Commonly known as:  TRESIBA FLEXTOUCH Inject 0.13 mLs (13 Units total) into the skin daily at 10 pm.   levonorgestrel 20 MCG/24HR IUD Commonly known as:  MIRENA 1 each by Intrauterine route once.   metFORMIN 1000 MG tablet Commonly known as:  GLUCOPHAGE Take 1 tablet (1,000 mg total) by mouth 2 (two) times daily with a meal.   metoprolol succinate 25 MG 24 hr tablet Commonly known as:  TOPROL-XL Take 1 tablet (25 mg total) by mouth daily. Start taking on:  11/06/2017   MULTI ADULT GUMMIES PO Take 2 tablets by mouth daily.   nitroGLYCERIN 0.4 MG SL tablet Commonly known as:  NITROSTAT Place 1 tablet (0.4 mg total) under the tongue every 5 (five) minutes as needed for chest pain.   omeprazole 20 MG tablet Commonly known as:  PRILOSEC OTC Take 20 mg by mouth as needed (heartburn).   ticagrelor 90 MG Tabs tablet Commonly known as:  BRILINTA Take 1 tablet (90 mg total) by mouth 2 (two) times daily.   ticagrelor 90 MG Tabs tablet Commonly known as:  BRILINTA Take 1 tablet (90 mg total) by mouth 2 (two) times daily.       Discharge Instructions: Please refer to Patient Instructions section of EMR for full details.  Patient was counseled important signs and symptoms that should prompt return to medical care, changes in medications, dietary instructions, activity restrictions, and follow up appointments.   Follow-Up Appointments: Follow-up Information    [name], Nadean Corwin, MD Follow up.   Specialty:  Cardiology Why:  our office will call you with a hospital follow-up visit in 1 week Contact information: [place] [address] [phone]        Guadalupe Dawn, MD. Go  on 11/07/2017.   Specialty:  Family Medicine Why:  1:30pm Contact information: [address] [place] [address] [place], [place], DO 11/05/2017, 5:31 PM PGY-1, [place]

## 2017-11-04 NOTE — Progress Notes (Signed)
Patient verbalizing anxiety related to given herself a shot of insulin. Spoke with her and encouraged her, demonstrated how to draw up dose from vial with a syringe. Patient attentive and said, "I can do that." just verbalized anxiety over giving herself the shot. New syringe given to patient and patient drew up 8 units of insulin with no trouble. She then wiped her abdomin with alcohol, pinched her skin and gave herself a shot. She was giggling quietly and I said, "How was that, you just don't want to admit that it wasn't that bad, right? " She nodded yes. Patient smiling and responding well. No s/s anxiety.

## 2017-11-04 NOTE — Progress Notes (Signed)
Family Medicine Teaching Service Daily Progress Note Intern Pager: [phone]  Patient name: Lauren Delgado Medical record number: [id number] Date of birth: [birth year] Age: 32 y.o. Gender: female  Primary Care Provider: Patient, No Pcp Per Consultants: cardiology Code Status: FULL  Pt Overview and Major Events to Date:  3/1 admitted with CP, SOB; started on heparin drip 3/2 Lantus 10U started  Assessment and Plan: Lauren Delgado is a 32 y.o. female presenting with chest pain, SOB. PMH is significant for T2DM (last A1c 2016 13.6), HLD (no meds), morbid obesity, HTN (home med is HCTZ), HSV.   Chest pain: Now S/P Cath on 3/4.   Echo with EF 35-40% and findings suggestive of ischemic damage. Initial EKG without STEMI, ?ST elevation vs early repol in V2. Trop 13.7>14>9. CTA PE negative. Started on heparin drip on day of admission.  - Cards consulted, appreciate recs -hydralazine25/metoprolol 50 per cards - Continue heparin drip - Nitro PRN CP - Telemetry  Tachycardia:  90-100s.  Question dehydration (recent URI as well as osmotic diuresis 2/2 hyperglycemia) vs cardiac etiology. CTA PE negative.  - Cards following - toprol XL 50mg   DM: A1C on admission 14.6. Home meds are metformin and glyburide. CBGs ~300 with 30+u of short acting insulin and 10 lantus - CBG qACHS - increase  Lantus 13U qhs. Increase as needed pending sliding scale requirements -will discuss orange card discount plan with patient and get documentation to her tommorow - Moderate SSI  -clinic will try to help patient with orange card  Possible sarcoidosis: Family hx sarcoidosis (mother). CXR finding is not new (consistent with CXR from 2017). Concerning for sarcoidosis. No cutaneous findings on exam. CTA with some findings suggestive of sarcoid (bilateral nodules), but no hilar adenopathy.  - Consider non urgent pulm consult vs outpatient pulm referral  - Needs outpatient PFTs  HLD: Known, was previously  recommended for lifestyle modifications. Total cholesterol 203, triglycerides 414.  - Consider fibrate given elevated triglyceries - Cont Lipitor 80mg  per cards recs  Obesity: Now with two associated comorbidities. Weight loss attempts unclear. May be a candidate for bariatric surgery.  - Consider outpatient bariatric surgery referral  Reproductive age:  Patient with Mirena IUD. Given medical disease, pregnancy would likely be high risk. Counseled the patient about likely need for ACEi and pregnancy category X. States she does not desire pregnancy at present.  - Continue Mirena IUD  FEN/GI: heart healthy diet Prophylaxis: heparin infusion   Disposition: continued care in [place]  Subjective:  Saw s/p cath as she was at procedure for morning rounds.  Still drowsy and right arm was uncomfortable/swollen but she was in good spirits.  Objective: Temp:  [97.4 F (36.3 C)-98 F (36.7 C)] 98 F (36.7 C) (03/04 0413) Pulse Rate:  [94-104] 98 (03/04 0413) Resp:  [14-29] 16 (03/04 0413) BP: (103-129)/(61-116) 121/89 (03/04 0413) SpO2:  [100 %] 100 % (03/04 0413) Weight:  [269 lb 4.8 oz (122.2 kg)] 269 lb 4.8 oz (122.2 kg) (03/04 0413) Physical Exam: General: obese female lying in bed, drowsy but  in NAD  Cardiovascular: RRR, no murmur Respiratory: CTAB, no wheezes or crackles Extremities: right arm swollen with two bands compressing prior hematoma after procedure. Skin: warm and dry Psych: appropriate mood, flat affect Neuro: A&Ox3  Laboratory: Recent Labs  Lab 11/02/17 0706 11/03/17 0904 11/04/17 0358  WBC 7.9 7.1 8.8  HGB 12.7 11.4* 12.5  HCT 36.7 33.5* 37.1  PLT 312 296 320   Recent Labs  Lab 11/02/17  4315 11/03/17 0904 11/04/17 0358  NA 131* 132* 137  K 3.9 3.7 4.0  CL 97* 100* 103  CO2 21* 21* 24  BUN 11 14 17   CREATININE 0.68 0.74 0.71  CALCIUM 8.8* 8.5* 9.1  GLUCOSE 387* 454* 271*    Imaging/Diagnostic Tests: Dg Chest 2 View  Result Date:  11/01/2017 CLINICAL DATA:  Cough and shortness of breath EXAM: CHEST  2 VIEW COMPARISON:  November 21, 2015 FINDINGS: 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 There is no new opacity. No consolidation. Heart size and pulmonary vascularity are normal. No adenopathy. No bone lesions. 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 Etiology uncertain. This appearance may be seen with prior granulomatous type infection or sarcoidosis. Note absence of adenopathy. This appearance also may be due to atypical allergic type response. Stability over time suggests benign etiology. No new opacity evident. No consolidation. No adenopathy. Heart size normal. Electronically Signed   By: Lowella Grip III M.D.   On: 11/01/2017 16:06   Ct Angio Chest Pe W Or Wo Contrast  Result Date: 11/01/2017 CLINICAL DATA:  Chest pain for 2 weeks. PE suspected, low pretest prob EXAM: CT ANGIOGRAPHY CHEST WITH CONTRAST TECHNIQUE: Multidetector CT imaging of the chest was performed using the standard protocol during bolus administration of intravenous contrast. Multiplanar CT image reconstructions and MIPs were obtained to evaluate the vascular anatomy. CONTRAST:  50 cc ISOVUE-370 IOPAMIDOL (ISOVUE-370) INJECTION 76% COMPARISON:  Chest radiograph earlier this day, and 11/21/2015 FINDINGS: Cardiovascular: There are no filling defects within the pulmonary arteries to suggest pulmonary embolus. The heart is normal in size. No pericardial effusion. Mediastinum/Nodes: No enlarged mediastinal or hilar lymph nodes. No axillary adenopathy. Visualized thyroid gland is normal. The esophagus is decompressed. Lungs/Pleura: Bilateral small nodular opacities throughout both lungs. There may be slight perihilar distribution, no apical to basilar gradient. No definite nodular calcification. No confluent airspace disease. No pulmonary edema. No pleural fluid. Trachea and mainstem bronchi are patent.  Upper Abdomen: No acute abnormality. Musculoskeletal: There are no acute or suspicious osseous abnormalities. Review of the MIP images confirms the above findings. IMPRESSION: 1. No pulmonary embolus. 2. Multiple small bilateral pulmonary nodules, chronic and similar to prior radiographs dating back to 2017. Etiology is uncertain, slight perihilar distribution can be seen in the setting of sarcoidosis, however no associated adenopathy. Sequela of prior infectious or inflammatory process is also considered. Electronically Signed   By: Jeb Levering M.D.   On: 11/01/2017 22:19     Sherene Sires, DO 11/04/2017, 7:13 AM PGY-1, [place] Intern pager: [phone], text pages welcome

## 2017-11-04 NOTE — Progress Notes (Signed)
Golf ball sized hematoma noted above the TR band on the rt wrist upon arrival to [facility] unit.  Manual pressure applied to site and hematoma reduced to level 0.  Additional TR band placed just above the original one with 12cc air inserted.  RN aware.

## 2017-11-04 NOTE — Progress Notes (Signed)
Inpatient Diabetes Program Recommendations  AACE/ADA: New Consensus Statement on Inpatient Glycemic Control (2015)  Target Ranges:  Prepandial:   less than 140 mg/dL      Peak postprandial:   less than 180 mg/dL (1-2 hours)      Critically ill patients:  140 - 180 mg/dL   Review of Glycemic Control  Diabetes history: Type 2 DM Outpatient Diabetes medications: Metformin 1,000 mg BID, Glyburide 2.5 mg BID Current orders for Inpatient glycemic control: Novolog 0-15 Units TID, Lantus 10 Units QHS, Invokana 100 mg QD  Inpatient Diabetes Program Recommendations:    Patient has no insurance and A1C 14.6%. Invokana is expensive as outpt doubt patient will take as she stated to DM Coordinator that "Finances were kinda an issue and so I just didn't take it." Given patient will need insulin discharge and does not follow up, best option may be Novolin 70/30. Based on weight and current glucose trends would recommend increasing Lantus to 16-18 Units QHS, starting Novolog 5 units TIDAC and novolog 0-5 QHS.  Blood glucose meter kit (includes lancets and strips) ([id number])  Thanks,  Tama Headings RN, MSN, BC-ADM, [facility] Inpatient Diabetes Coordinator Team Pager [phone] (8a-5p)

## 2017-11-05 ENCOUNTER — Telehealth: Payer: Self-pay | Admitting: *Deleted

## 2017-11-05 DIAGNOSIS — Z955 Presence of coronary angioplasty implant and graft: Secondary | ICD-10-CM

## 2017-11-05 DIAGNOSIS — I5022 Chronic systolic (congestive) heart failure: Secondary | ICD-10-CM

## 2017-11-05 LAB — BASIC METABOLIC PANEL
Anion gap: 10 (ref 5–15)
BUN: 14 mg/dL (ref 6–20)
CO2: 23 mmol/L (ref 22–32)
Calcium: 9.2 mg/dL (ref 8.9–10.3)
Chloride: 103 mmol/L (ref 101–111)
Creatinine, Ser: 0.64 mg/dL (ref 0.44–1.00)
GFR calc Af Amer: >60 mL/min (ref >60)
GFR calc non Af Amer: >60 mL/min (ref >60)
Glucose, Bld: 212 mg/dL — ABNORMAL HIGH (ref 65–99)
Potassium: 3.8 mmol/L (ref 3.5–5.1)
Sodium: 136 mmol/L (ref 135–145)

## 2017-11-05 LAB — GLUCOSE, CAPILLARY
Glucose-Capillary: 198 mg/dL — ABNORMAL HIGH (ref 65–99)
Glucose-Capillary: 253 mg/dL — ABNORMAL HIGH (ref 65–99)
Glucose-Capillary: 375 mg/dL — ABNORMAL HIGH (ref 65–99)

## 2017-11-05 LAB — CBC
HCT: 35.1 % — ABNORMAL LOW (ref 36.0–46.0)
Hemoglobin: 12 g/dL (ref 12.0–15.0)
MCH: 30.6 pg (ref 26.0–34.0)
MCHC: 34.2 g/dL (ref 30.0–36.0)
MCV: 89.5 fL (ref 78.0–100.0)
Platelets: 328 10*3/uL (ref 150–400)
RBC: 3.92 MIL/uL (ref 3.87–5.11)
RDW: 12.4 % (ref 11.5–15.5)
WBC: 9.4 10*3/uL (ref 4.0–10.5)

## 2017-11-05 MED ORDER — ATORVASTATIN CALCIUM 80 MG PO TABS: 80.0000 mg | ORAL_TABLET | Freq: Every day | ORAL | 0 refills | Status: DC

## 2017-11-05 MED ORDER — ASPIRIN 81 MG PO CHEW: 81.0000 mg | CHEWABLE_TABLET | Freq: Every day | ORAL | Status: DC

## 2017-11-05 MED ORDER — NITROGLYCERIN 0.4 MG SL SUBL: 0.4000 mg | SUBLINGUAL_TABLET | SUBLINGUAL | 0 refills | Status: DC | PRN

## 2017-11-05 MED ORDER — METOPROLOL SUCCINATE ER 25 MG PO TB24: 25.0000 mg | ORAL_TABLET | Freq: Every day | ORAL | 0 refills | Status: DC

## 2017-11-05 MED ORDER — HYDRALAZINE HCL 25 MG PO TABS: 25.0000 mg | ORAL_TABLET | Freq: Three times a day (TID) | ORAL | 0 refills | Status: DC

## 2017-11-05 MED ORDER — TICAGRELOR 90 MG PO TABS: 90.0000 mg | ORAL_TABLET | Freq: Two times a day (BID) | ORAL | 0 refills | Status: DC

## 2017-11-05 MED ORDER — INSULIN ASPART 100 UNIT/ML ~~LOC~~ SOLN: 0.0000 [IU] | Freq: Three times a day (TID) | SUBCUTANEOUS | 11 refills | Status: DC

## 2017-11-05 MED ORDER — INSULIN DEGLUDEC 100 UNIT/ML ~~LOC~~ SOPN: 13.0000 [IU] | PEN_INJECTOR | Freq: Every day | SUBCUTANEOUS | 5 refills | Status: DC

## 2017-11-05 MED ORDER — ASPIRIN 81 MG PO CHEW: 81.0000 mg | CHEWABLE_TABLET | Freq: Every day | ORAL | 0 refills | Status: DC

## 2017-11-05 MED ORDER — INSULIN ASPART 100 UNIT/ML ~~LOC~~ SOLN: 0.0000 [IU] | Freq: Three times a day (TID) | SUBCUTANEOUS | 5 refills | Status: DC

## 2017-11-05 MED ORDER — TICAGRELOR 90 MG PO TABS: 90.0000 mg | ORAL_TABLET | Freq: Two times a day (BID) | ORAL | 3 refills | Status: DC

## 2017-11-05 NOTE — Progress Notes (Signed)
Family Medicine Teaching Service Daily Progress Note Intern Pager: [phone]  Patient name: Lauren Delgado Medical record number: [id number] Date of birth: Sep 09, 1985 Age: 32 y.o. Gender: female  Primary Care Provider: Patient, No Pcp Per Consultants: cardiology Code Status: FULL  Pt Overview and Major Events to Date:  3/1 admitted with CP, SOB; started on heparin drip 3/2 Lantus 10U started 3/3 Cath/PCI 3/4 lantus to 13  Assessment and Plan: Lauren Delgado is a 32 y.o. female presenting with chest pain, SOB. PMH is significant for T2DM (last A1c 2016 13.6), HLD (no meds), morbid obesity, HTN (home med is HCTZ), HSV.   Chest pain: Now S/P CathPCI on 3/4.   Echo with EF 35-40% and findings suggestive of ischemic damage. Initial EKG without STEMI, ?ST elevation vs early repol in V2. Trop 13.7>14>9. CTA PE negative. Started on heparin drip on day of admission.  - Cards consulted, appreciate recs -hydralazine25/metoprolol 50 per cards -will need DAPT with brillinta for 1/yr then plavix  - Continue heparin drip per pharm - Nitro PRN CP - Telemetry  Tachycardia:  low 100ss.   - Cards following post cath - toprol XL 50mg   DM: A1C on admission 14.6. Home meds are metformin and glyburide. CBGs ~200 am 3/5 on 13 lantus - CBG qACHS -awill DC on  Lantus 13U qhs   -will help with orange card application - Moderate SSI novalog -restart metofrmin on d/c  Possible sarcoidosis: Family hx sarcoidosis (mother). CXR finding is not new (consistent with CXR from 2017). Concerning for sarcoidosis. No cutaneous findings on exam. CTA with some findings suggestive of sarcoid (bilateral nodules), but no hilar adenopathy.  - Consider non urgent pulm consult vs outpatient pulm referral  - Needs outpatient PFTs  HLD: Known, was previously recommended for lifestyle modifications. Total cholesterol 203, triglycerides 414.  - Consider fibrate given elevated triglyceries - Cont Lipitor 80mg  per  cards recs  Obesity: Now with two associated comorbidities. Weight loss attempts unclear. May be a candidate for bariatric surgery.  -will discuss outpatient bariatric surgery referral  Reproductive age:  Patient with Mirena IUD. Given medical disease, pregnancy would likely be high risk. Counseled the patient about likely need for ACEi and pregnancy category X. States she does not desire pregnancy at present.  - Continue Mirena IUD  FEN/GI: heart healthy diet Prophylaxis: heparin infusion   Disposition: continued care in [place]  Subjective:  Patient feeling better and swelling in arm is resolved.   Will consider bariatric referal  Objective: Temp:  [97.3 F (36.3 C)-98.9 F (37.2 C)] 98.9 F (37.2 C) (03/05 0635) Pulse Rate:  [0-129] 99 (03/05 0635) Resp:  [6-68] 21 (03/05 0635) BP: (67-152)/(35-110) 138/97 (03/05 0635) SpO2:  [0 %-100 %] 97 % (03/05 0635) Weight:  [273 lb 5.9 oz (124 kg)] 273 lb 5.9 oz (124 kg) (03/05 2330) Physical Exam: General: obese female lying in bed, drowsy but  in NAD  Cardiovascular: RRR, no murmur Respiratory: CTAB, no wheezes or crackles Extremities: right arm swelling is resolving Skin: warm and dry Psych: appropriate mood, flat affect Neuro: A&Ox3  Laboratory: Recent Labs  Lab 11/03/17 0904 11/04/17 0358 11/05/17 0343  WBC 7.1 8.8 9.4  HGB 11.4* 12.5 12.0  HCT 33.5* 37.1 35.1*  PLT 296 320 328   Recent Labs  Lab 11/03/17 0904 11/04/17 0358 11/05/17 0343  NA 132* 137 136  K 3.7 4.0 3.8  CL 100* 103 103  CO2 21* 24 23  BUN 14 17 14   CREATININE  0.74 0.71 0.64  CALCIUM 8.5* 9.1 9.2  GLUCOSE 454* 271* 212*    Imaging/Diagnostic Tests: Dg Chest 2 View  Result Date: 11/01/2017 CLINICAL DATA:  Cough and shortness of breath EXAM: CHEST  2 VIEW COMPARISON:  November 21, 2015 FINDINGS: 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 There is no new opacity. No consolidation. Heart size and pulmonary  vascularity are normal. No adenopathy. No bone lesions. 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 Etiology uncertain. This appearance may be seen with prior granulomatous type infection or sarcoidosis. Note absence of adenopathy. This appearance also may be due to atypical allergic type response. Stability over time suggests benign etiology. No new opacity evident. No consolidation. No adenopathy. Heart size normal. Electronically Signed   By: Lowella Grip III M.D.   On: 11/01/2017 16:06   Ct Angio Chest Pe W Or Wo Contrast  Result Date: 11/01/2017 CLINICAL DATA:  Chest pain for 2 weeks. PE suspected, low pretest prob EXAM: CT ANGIOGRAPHY CHEST WITH CONTRAST TECHNIQUE: Multidetector CT imaging of the chest was performed using the standard protocol during bolus administration of intravenous contrast. Multiplanar CT image reconstructions and MIPs were obtained to evaluate the vascular anatomy. CONTRAST:  50 cc ISOVUE-370 IOPAMIDOL (ISOVUE-370) INJECTION 76% COMPARISON:  Chest radiograph earlier this day, and 11/21/2015 FINDINGS: Cardiovascular: There are no filling defects within the pulmonary arteries to suggest pulmonary embolus. The heart is normal in size. No pericardial effusion. Mediastinum/Nodes: No enlarged mediastinal or hilar lymph nodes. No axillary adenopathy. Visualized thyroid gland is normal. The esophagus is decompressed. Lungs/Pleura: Bilateral small nodular opacities throughout both lungs. There may be slight perihilar distribution, no apical to basilar gradient. No definite nodular calcification. No confluent airspace disease. No pulmonary edema. No pleural fluid. Trachea and mainstem bronchi are patent. Upper Abdomen: No acute abnormality. Musculoskeletal: There are no acute or suspicious osseous abnormalities. Review of the MIP images confirms the above findings. IMPRESSION: 1. No pulmonary embolus. 2. Multiple small bilateral pulmonary nodules, chronic and  similar to prior radiographs dating back to 2017. Etiology is uncertain, slight perihilar distribution can be seen in the setting of sarcoidosis, however no associated adenopathy. Sequela of prior infectious or inflammatory process is also considered. Electronically Signed   By: Jeb Levering M.D.   On: 11/01/2017 22:19     Sherene Sires, DO 11/05/2017, 7:09 AM PGY-1, [place] Intern pager: [phone], text pages welcome

## 2017-11-05 NOTE — Telephone Encounter (Signed)
-----   Message from Consuelo Pandy, [place] sent at 11/05/2017  9:27 AM EST ----- Regarding: transition of care post hospital  Pt needs post hospital TOC f/u in 7-10 days. Please call to arrange f/u w/ Dr. Debara Pickett or APP on his team

## 2017-11-05 NOTE — Progress Notes (Signed)
Patient given a sample of Tresiba to take home with her until she can received Medicaid assistance.  Medication Samples have been provided to the patient.  Drug name: Tyler Aas      Strength: 13 units        Qty: 1 pen  LOT: [id number]  Exp.Date: 03/2019  Dosing instructions: Take 13 units qhs  The patient has been instructed regarding the correct time, dose, and frequency of taking this medication, including desired effects and most common side effects.   Berna Spare Marieli Rudy 1:56 PM 11/05/2017

## 2017-11-05 NOTE — Progress Notes (Addendum)
Progress Note  Patient Name: Lauren Delgado Date of Encounter: 11/05/2017  Primary Cardiologist: Dr. Debara Pickett   Subjective   Resting comfortably. No acute distress. She denies CP. No dyspnea. Right radial cath site a bit tender but stable.   Inpatient Medications    Scheduled Meds: . aspirin  81 mg Oral Daily  . atorvastatin  80 mg Oral QHS  . canagliflozin  100 mg Oral QAC breakfast  . hydrALAZINE  25 mg Oral Q8H  . insulin aspart  0-20 Units Subcutaneous TID WC  . insulin glargine  13 Units Subcutaneous QHS  . metoprolol succinate  25 mg Oral Daily  . potassium chloride  20 mEq Oral Daily  . sodium chloride flush  3 mL Intravenous Q12H  . ticagrelor  90 mg Oral BID   Continuous Infusions: . sodium chloride     PRN Meds: sodium chloride, acetaminophen, Influenza vac split quadrivalent PF, nitroGLYCERIN, ondansetron (ZOFRAN) IV, pneumococcal 23 valent vaccine, sodium chloride flush   Vital Signs    Vitals:   11/04/17 2029 11/04/17 2154 11/05/17 0635 11/05/17 0730  BP: 102/67 (!) 138/49 (!) 138/97 (!) 142/90  Pulse: 100  99 (!) 101  Resp: (!) 21  (!) 21 16  Temp: 98.7 F (37.1 C)  98.9 F (37.2 C) 98.4 F (36.9 C)  TempSrc: Oral  Oral Oral  SpO2: 94%  97% 98%  Weight:   273 lb 5.9 oz (124 kg)   Height:        Intake/Output Summary (Last 24 hours) at 11/05/2017 0850 Last data filed at 11/04/2017 2100 Gross per 24 hour  Intake 1142.89 ml  Output 450 ml  Net 692.89 ml   Filed Weights   11/03/17 0449 11/04/17 0413 11/05/17 0635  Weight: 269 lb 4.8 oz (122.2 kg) 269 lb 4.8 oz (122.2 kg) 273 lb 5.9 oz (124 kg)    Telemetry    NSR - Personally Reviewed  ECG    NSR - Personally Reviewed  Physical Exam   GEN: No acute distress. Obese   Neck: No JVD Cardiac: RRR, no murmurs, rubs, or gallops.  Respiratory: Clear to auscultation bilaterally. GI: Soft, nontender, non-distended  MS: No edema; No deformity.  Right radial cath site looks good.  Good pulses.    Neuro:  Nonfocal  Psych: Normal affect   Labs    Chemistry Recent Labs  Lab 11/03/17 0904 11/04/17 0358 11/05/17 0343  NA 132* 137 136  K 3.7 4.0 3.8  CL 100* 103 103  CO2 21* 24 23  GLUCOSE 454* 271* 212*  BUN 14 17 14   CREATININE 0.74 0.71 0.64  CALCIUM 8.5* 9.1 9.2  GFRNONAA >60 >60 >60  GFRAA >60 >60 >60  ANIONGAP 11 10 10      Hematology Recent Labs  Lab 11/03/17 0904 11/04/17 0358 11/05/17 0343  WBC 7.1 8.8 9.4  RBC 3.74* 4.09 3.92  HGB 11.4* 12.5 12.0  HCT 33.5* 37.1 35.1*  MCV 89.6 90.7 89.5  MCH 30.5 30.6 30.6  MCHC 34.0 33.7 34.2  RDW 12.2 12.5 12.4  PLT 296 320 328    Cardiac Enzymes Recent Labs  Lab 11/01/17 2054 11/02/17 0015 11/02/17 0706  TROPONINI 13.74* 14.08* 9.28*    Recent Labs  Lab 11/01/17 1550  TROPIPOC 11.35*     BNPNo results for input(s): BNP, PROBNP in the last 168 hours.   DDimer No results for input(s): DDIMER in the last 168 hours.   Radiology    No results found.  Cardiac Studies   2D Echo 11/02/17 Study Conclusions  - Left ventricle: The cavity size was normal. There was moderate   concentric hypertrophy. Systolic function was moderately reduced.   The estimated ejection fraction was in the range of 35% to 40%.   Doppler parameters are consistent with abnormal left ventricular   relaxation (grade 1 diastolic dysfunction). Doppler parameters   are consistent with elevated ventricular end-diastolic filling   pressure. - Aortic valve: There was no regurgitation. - Mitral valve: There was no regurgitation. - Right ventricle: Systolic function was normal. - Tricuspid valve: There was trivial regurgitation. - Pulmonary arteries: Systolic pressure was within the normal   range.  Impressions:  - There is hypokinesis of the basal and mid anteroseptal, anterior,   mid inferoseptal and apical septal and anterior walls suspicious   for ischemia in the LAD territory. There is no prior echo   available for  comparison.   Procedures   CORONARY STENT INTERVENTION  LEFT HEART CATH AND CORONARY ANGIOGRAPHY 11/04/17  Conclusion     Dist RCA lesion is 70% stenosed. Lesion at trifurcation of three small vessels.  Prox RCA lesion is 80% stenosed.  A drug-eluting stent was successfully placed using a STENT SYNERGY DES 3.5X16.  Post intervention, there is a 0% residual stenosis.  Ramus lesion is 80% stenosed.  A drug-eluting stent was successfully placed using a STENT SYNERGY DES 2.75X16. A drug-eluting stent was successfully placed using a STENT SYNERGY DES 2.75X8. to cover a proximal edge dissection  Post intervention, there is a 0% residual stenosis.  Prox LAD to Mid LAD lesion is 99% stenosed. This was the culprit lesion.  A drug-eluting stent was successfully placed using a STENT SYNERGY DES 3X38, postdilated to > 3.5 mm.  Post intervention, there is a 0% residual stenosis.  There is moderate left ventricular systolic dysfunction.  LV end diastolic pressure is mildly elevated.  The left ventricular ejection fraction is 35-45% by visual estimate.  There is no aortic valve stenosis.  A drug-eluting stent was successfully placed using a STENT SYNERGY DES 2.75X8.   Successful three vessel PCI.  She will need DAPT for one year with Brilinta.  Likely indefinite Plavix after that time.  She needs aggressive risk factor modification.      Patient Profile     32 yo female with poorly controlled T2DM, obesity, HTN, HLD, possible sarcoidosis and family history of CAD, who initially presented to the ED on 11/01/17 with 2 weeks of intermittent SSCP, associated with shortness of breath, congestion, cough and chills. EKG showed mild anteroseptal ST elevation <1 mm at the jPoint in V1-V3, however, there is subtle inferior and lateral ST elevation, possibly consistent with pericarditis or early repolarization. She ruled in for NSTEMI w/ troponin levels peaking at 14.08. Echo showed reduced LVEF  at 35-40%, w/ hypokinesis of the basal and mid anteroseptal, anterior,mid inferoseptal and apical septal and anterior walls suspicious for ischemia in the LAD territory. LHC showed 3 vessel CAD treated with PCI + DES to the LAD, RCA and RI.    Assessment & Plan    1. NSTEMI/ Multivessel CAD: Troponin peaked at 14. Subsequent LHC showed 3V CAD with 80% prox RCA (PCI + DES), 70% distal RCA (treated medically), 80% RI (PCI +DES) and 99% prox to mid LAD (PCI +DES). EF 35-40%. Plan to treat with DAPT w/ ASA + Brilinta x 1 year and likely indefinite Plavix after that time + aggressive risk factor modification (improved glycemic control,  lipid reduction, BP control and weight loss). In addition to DAPT, continue with high intensity statin therapy with target LDL < 70mg /DL. Continue metoprolol.   2. Systolic HF/ Ischemic Cardiomyopathy: EF 35-40% by recent echo, in the setting of multivessel CAD. She is s/p percutaneous revascularization, so hopefully EF may improve. We will plan to get a repeat outpatient echo in several weeks. For now, continue medical management with long acting metoprolol and hydralazine. Typically in the case of systolic HF, we would recommend medical management with an ACE/ARB, however she is of child bearing age and there is concerns regarding compliance. For, now we will hold off on initiation of ACE/ARB therapy. When pt is seen back in clinic for post hospital f/u with Dr. Debara Pickett, we can reconsider starting this class of medication if the pt can demonstrate clear understanding of the importance of avoiding pregnancy. Encourage low sodium diet and daily weights. Good BP control also important.  3. Post Cath: right radial cath site is stable. VSS. Renal function, H/H stable. Pt should be advised to avoid lifting anything heavier than a half gallon of milk x 48 hrs.    4. T2DM: poorly controlled. Hgb A1c 14. Management per IM.  5. HTN: moderately elevated this morning. Continue metoprolol  and hydralazine. Can further titrate meds as outpatient.   6. Obesity: Body mass index is 48.43 kg/m. Need to encourage weight loss/dietary changes.   7. HLD: Unable to calculate LDL given elevated TGs > 400. She has been started on Lipitor 80 mg and will need repeat FLP and HFTs in 6 weeks. Goal LDL is < 70 mg/dL, given CAD and multiple other risk factors. If not at goal in 6 weeks, we can add Zetia or refer to Lipid clinic for PCSK9 inhibitor therapy.   Dispo: pt is stable for d/c from a cardiac standpoint. Primary team managing other hospital problems and will determine time of hospital discharge. Cardiology will sign off. We will arrange post hospital f/u w/ Dr. Debara Pickett or APP. We will write for Brilinta Rx. Please call with further questions.     For questions or updates, please contact [place] Please consult [email or web address] for contact info under Cardiology/STEMI.      Signed, Lyda Jester, PA-C  11/05/2017, 8:50 AM    Attending Note:   The patient was seen and examined.  Agree with assessment and plan as noted above.  Changes made to the above note as needed.  Patient seen and independently examined with Ellen Henri, PA .   We discussed all aspects of the encounter. I agree with the assessment and plan as stated above.  1.  CAD - s/p stenting of her LAD.   Agree with DAPT for a year and then indefinate plavix  2. Chronic systolic CHF:   Have discussed this in detail with Ellen Henri, [place]. ACE - I / ARB are indicated but I have concerns about her compliance.   We would want to avoid treating a child bearing woman with ARB or ACE-I. She would not talk to me on rounds today so I was not able to have any meaningful discussion about compliance.  Hopefully, Dr. Debara Pickett will be able to have this discussion with her after he is able to establish a relationship with her .   3.  Morbid obesity :   Needs to lose weight    I have spent a total of 40 minutes with patient  reviewing hospital  notes , telemetry, EKGs, labs and examining  patient as well as establishing an assessment and plan that was discussed with the patient. > 50% of time was spent in direct patient care.   Thayer Headings, Brooke Bonito., MD, [facility] 11/05/2017, 9:14 AM [address]  [place] Pager [phone]

## 2017-11-05 NOTE — Progress Notes (Signed)
Inpatient Diabetes Program Recommendations  AACE/ADA: New Consensus Statement on Inpatient Glycemic Control (2015)  Target Ranges:  Prepandial:   less than 140 mg/dL      Peak postprandial:   less than 180 mg/dL (1-2 hours)      Critically ill patients:  140 - 180 mg/dL   Lab Results  Component Value Date   GLUCAP 198 (H) 11/05/2017   HGBA1C 14.6 (H) 11/01/2017    Review of Glycemic Control Results for Lauren Delgado, Lauren Delgado (MRN [id number]) as of 11/05/2017 09:40  Ref. Range 11/04/2017 07:40 11/04/2017 11:39 11/04/2017 17:56 11/04/2017 21:14 11/05/2017 06:39  Glucose-Capillary Latest Ref Range: 65 - 99 mg/dL 258 (H) 266 (H) 259 (H) 327 (H) 198 (H)   Diabetes history: Type 2 DM Outpatient Diabetes medications: Metformin 1,000 mg BID, Glyburide 2.5 mg BID Current orders for Inpatient glycemic control: Novolog 0-20 Units TID, Lantus 13 Units QHS, Invokana 100 mg QD  Inpatient Diabetes Program Recommendations:    Patient has no insurance and A1C 14.6%. Invokana is expensive as outpt doubt patient will take as she stated to DM Coordinator that "Finances were kinda an issue and so I just didn't take it." Given patient will need insulin discharge and does not follow up, best option may be Novolin 70/30.  Based on weight and current glucose trends would recommend increasing Lantus to 20 Units QHS, starting Novolog 5 units TIDAC and novolog 0-5 QHS and discontinuing the Invokana. Text page sent.  Blood glucose meter kit (includes lancets and strips) ([id number])  Thanks, Bronson Curb, MSN, RNC-OB Diabetes Coordinator [phone] (8a-5p)

## 2017-11-05 NOTE — Telephone Encounter (Signed)
Message has been forwarded to admin pool to schedule follow up appt.

## 2017-11-05 NOTE — Progress Notes (Signed)
CARDIAC REHAB PHASE I   PRE:  Rate/Rhythm: 100 ST    BP: sitting 104/69    SaO2:   MODE:  Ambulation: 500 ft   POST:  Rate/Rhythm: 121 ST    BP: sitting 99/60     SaO2:   Tolerated fairly well, HR elevated, BP low when took in left arm at bend. Ed completed with pt (mother went in [place]). Pt voiced understanding regarding need for medicine, esp Brilinta. We discussed DM control starting with simple sugared drinks and junk food. Pt sts she mostly eats junk food. Highly encouraged daily exercise. Will refer to [place]. Pt will eventually get insurance with her job. Pt is overwhelmed with information and will need continuous reiteration and support. She was able to perform teach back.  [place], ACSM 11/05/2017 10:22 AM

## 2017-11-06 ENCOUNTER — Other Ambulatory Visit: Payer: Self-pay | Admitting: Internal Medicine

## 2017-11-06 ENCOUNTER — Telehealth: Payer: Self-pay | Admitting: Family Medicine

## 2017-11-06 DIAGNOSIS — E119 Type 2 diabetes mellitus without complications: Secondary | ICD-10-CM

## 2017-11-06 MED ORDER — METFORMIN HCL 1000 MG PO TABS: 1000.0000 mg | ORAL_TABLET | Freq: Two times a day (BID) | ORAL | 0 refills | Status: DC

## 2017-11-06 NOTE — Telephone Encounter (Signed)
Called patient and let her know that her metformin is at pharmacy.Ozella Almond, CMA

## 2017-11-06 NOTE — Telephone Encounter (Signed)
Pt called because she has an appointment with Kris Mouton tomorrow as a f/u for her procedure done on Monday. She said she was told to start taking Metformin 48 hours after her procedure but nobody gave her a Rx for it or anything and she doesn't know how to get this to start taking it. [facility] [address] in [place]. Please advise

## 2017-11-06 NOTE — Telephone Encounter (Signed)
Please let patient know that I sent in a prescription for Metformin into her pharmacy. Thanks!

## 2017-11-07 ENCOUNTER — Encounter: Payer: Self-pay | Admitting: Family Medicine

## 2017-11-07 ENCOUNTER — Ambulatory Visit (INDEPENDENT_AMBULATORY_CARE_PROVIDER_SITE_OTHER): Payer: Self-pay | Admitting: Family Medicine

## 2017-11-07 ENCOUNTER — Other Ambulatory Visit: Payer: Self-pay

## 2017-11-07 DIAGNOSIS — E1165 Type 2 diabetes mellitus with hyperglycemia: Secondary | ICD-10-CM

## 2017-11-07 DIAGNOSIS — IMO0002 Reserved for concepts with insufficient information to code with codable children: Secondary | ICD-10-CM

## 2017-11-07 DIAGNOSIS — E118 Type 2 diabetes mellitus with unspecified complications: Secondary | ICD-10-CM

## 2017-11-07 DIAGNOSIS — Z955 Presence of coronary angioplasty implant and graft: Secondary | ICD-10-CM

## 2017-11-07 MED ORDER — INSULIN ASPART 100 UNIT/ML ~~LOC~~ SOLN
0.0000 [IU] | Freq: Three times a day (TID) | SUBCUTANEOUS | 11 refills | Status: DC
Start: 2017-11-07 — End: 2018-02-20

## 2017-11-07 NOTE — Patient Instructions (Addendum)
It was great seeing you today! I am glad that things have been going well since your hospitalization. I know things have been pretty topsy turvy lately, but am glad things are returning to normal. That being said if you have any questions or concerns, please let me know.  In regards to your sugar control, I would switch your lantus to dosing to 1000 in the morning. You can skip your dose tonight, and start it tomorrow morning. Please work on getting your paperwork filled out for the orange card. This will make your insulin more affordable. If affordability continues to be an issue we can likely switch you to another form of the short acting insulin. Please continue taking your metformin as prescribed. If the diarrhea continues to be an issue early next week, please let me know.  If you have not heard back from cardiology in a few days, please let me know and I will call them and get your appointment scheduled. I am glad that you have already started making changes in regards to your diet and exercise. I have given you a card to schedule an appointment with Dr. Jenne Campus, our nutritionist. Please call to schedule an appointment with her. I believe that she will be a great resource. Please come back and see me in around 3 months for an A1C check. Obviously call me if any issues arise.

## 2017-11-07 NOTE — Progress Notes (Signed)
HPI  32 year old female who presents for hospital follup. Patient was admitted to the hospital on 3/1 and was found to have 80% stenosis of prox RCA, and distal RCA. 80% RI and 99% prox to mid LAD. She underwent stent placement of the proximal RCA, RI, and LAD. Discharged on brillinta, lantus, and aspart.  Has been doing well since discharge. No symptoms of shortness of breath, chest pain, and says that she feels back to normal. Has been monitoring her sugars but forgot her log at home. States that her sugars are usually in the mid 200s. Has not been taking her metformin as she just had the prescription filled. Has not heard back from cardiology yet, asked patient to let me know if she has not heard back from them so that I can help her get follow up.  Has been doing well and making changes with diet and exercise. Has been eating mainly vegetables and baked fish/chicken. Has been taking is very easy as far as physical activity goes. Recommended starting to walk for 10-15 minutes 4 times per week initially and then adding on extra time as she can tolerate.  CC: hospital follow up   ROS: Review of Systems  Constitutional: Negative for chills and fever.  HENT: Negative for congestion.   Respiratory: Negative for cough, sputum production and shortness of breath.   Cardiovascular: Negative for chest pain and palpitations.  Gastrointestinal: Negative for abdominal pain, diarrhea, nausea and vomiting.  Musculoskeletal: Negative for myalgias.  Neurological: Negative for dizziness, weakness and headaches.  Psychiatric/Behavioral: Negative for depression.    Review of Systems See HPI for ROS.   CC, SH/smoking status, and VS noted  Objective: BP 124/80   Pulse (!) 106   Temp 97.6 F (36.4 C) (Oral)   Ht 5\' 3"  (1.6 m)   Wt 261 lb 12.8 oz (118.8 kg)   LMP 11/04/2017 (Exact Date)   SpO2 98%   BMI 46.38 kg/m  Gen: NAD, alert, cooperative, and pleasant. Obese, African American female  resting comfortably at edge of bed HEENT: NCAT, EOMI, PERRL CV: RRR, no murmur. Palpable radial pulse bilaterally. Access site in right wrist c/d/i, healing well with noticeably bruising that is improving. Resp: CTAB, no wheezes, non-labored Abd: SNTND, BS present, no guarding or organomegaly Ext: No edema, warm Neuro: Alert and oriented, Speech clear, No gross deficits   Assessment and plan:  Morbid obesity ([place]) Gave continued counseling on dietary and activity changes she needs to make in order to lose weight. Patient is now appropriately highly motivated. I have asked her to call clinical nutritionist, Dr. Jenne Campus, to set up an appointment for further help. Have also asked for her to schedule an appointment with me to discuss weight loss as she gets farther away from her hospitalization.  Diabetes mellitus type 2, uncontrolled, with complications ([place]) Per her reports her sugars are better controlled. Mid 250s although she forgot her chart. She would like to take her lantus in the morning which I think is reasonable and actually preferred. She will now take 13U in the morning. She just started her metformin this morning. Will see her back in 3 months for a1c check. If she is unable to get orange card, can likely switch her to 70/30 for affordability as her aspart is quite expensive for her.  Status post coronary artery stent placement Is taking brillinta as prescribed. Will need to be on for at least one year and then likely swtiched to plavix for  life. Needs follow up with cardiology. Have asked for her to let me know if has not scheduled appointment within the next week.   No orders of the defined types were placed in this encounter.   Meds ordered this encounter  Medications  . insulin aspart (NOVOLOG) 100 UNIT/ML injection    Sig: Inject 0-20 Units into the skin 3 (three) times daily with meals. cbg70-120:0, cbg121-150:3, cbg151-200:4, cbg200-250:7, cbg251-300:11, cbg301-350:15,  cbg351-400:20, cbg>400 call your doctor    Dispense:  10 mL    Refill:  11     Guadalupe Dawn MD PGY-1 Family Medicine Resident 11/08/2017 2:17 PM

## 2017-11-08 NOTE — Assessment & Plan Note (Signed)
Per her reports her sugars are better controlled. Mid 250s although she forgot her chart. She would like to take her lantus in the morning which I think is reasonable and actually preferred. She will now take 13U in the morning. She just started her metformin this morning. Will see her back in 3 months for a1c check. If she is unable to get orange card, can likely switch her to 70/30 for affordability as her aspart is quite expensive for her.

## 2017-11-08 NOTE — Assessment & Plan Note (Signed)
Gave continued counseling on dietary and activity changes she needs to make in order to lose weight. Patient is now appropriately highly motivated. I have asked her to call clinical nutritionist, Dr. Jenne Campus, to set up an appointment for further help. Have also asked for her to schedule an appointment with me to discuss weight loss as she gets farther away from her hospitalization.

## 2017-11-08 NOTE — Assessment & Plan Note (Signed)
Is taking brillinta as prescribed. Will need to be on for at least one year and then likely swtiched to plavix for life. Needs follow up with cardiology. Have asked for her to let me know if has not scheduled appointment within the next week.

## 2017-11-18 ENCOUNTER — Ambulatory Visit (INDEPENDENT_AMBULATORY_CARE_PROVIDER_SITE_OTHER): Payer: Self-pay | Admitting: Physician Assistant

## 2017-11-18 ENCOUNTER — Encounter: Payer: Self-pay | Admitting: Physician Assistant

## 2017-11-18 VITALS — BP 142/70 | HR 102 | Ht 63.0 in | Wt 274.0 lb

## 2017-11-18 DIAGNOSIS — I255 Ischemic cardiomyopathy: Secondary | ICD-10-CM

## 2017-11-18 DIAGNOSIS — I2510 Atherosclerotic heart disease of native coronary artery without angina pectoris: Secondary | ICD-10-CM

## 2017-11-18 DIAGNOSIS — I10 Essential (primary) hypertension: Secondary | ICD-10-CM

## 2017-11-18 DIAGNOSIS — E1169 Type 2 diabetes mellitus with other specified complication: Secondary | ICD-10-CM

## 2017-11-18 DIAGNOSIS — T148XXA Other injury of unspecified body region, initial encounter: Secondary | ICD-10-CM

## 2017-11-18 DIAGNOSIS — E1065 Type 1 diabetes mellitus with hyperglycemia: Secondary | ICD-10-CM

## 2017-11-18 DIAGNOSIS — E785 Hyperlipidemia, unspecified: Secondary | ICD-10-CM

## 2017-11-18 MED ORDER — METOPROLOL SUCCINATE ER 50 MG PO TB24: 50.0000 mg | ORAL_TABLET | Freq: Every day | ORAL | 6 refills | Status: DC

## 2017-11-18 NOTE — Patient Instructions (Addendum)
Medication Instructions:  INCREASE Metoprolol Succinate to 50mg  Take 1 tablet once a day  Labwork: Your physician recommends that you return for lab work in: 2 months FASTING-LIPID, LFT  Testing/Procedures: None   Follow-Up: Your physician recommends that you schedule a follow-up appointment in: 2 months with Dr Debara Pickett.  Any Other Special Instructions Will Be Listed Below (If Applicable).  If you need a refill on your cardiac medications before your next appointment, please call your pharmacy.
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
## 2017-11-22 ENCOUNTER — Encounter: Payer: Self-pay | Admitting: Pharmacist

## 2017-11-22 NOTE — Progress Notes (Signed)
Mother of patient seen in office.  Requested samples of Novolog and Lantus for this patient recently hospitalize for hyperglycemia and started on basal bolus insulin.   Medication Samples have been provided to the patient's mother.  Drug name: Lantus   Qty: 1  LOT: [id number]  Exp.Date: 05/03/2020  Drug name: Illene Bolus: 1  LOT: [id number]  Exp.Date: 04/02/2018  The patient's mother instructed to continue same doses and request F/U with [facility] PCP.  F/U with Rx clinic if necessary per PCP.    Janeann Forehand 3:35 PM 11/22/2017

## 2017-12-02 ENCOUNTER — Telehealth: Payer: Self-pay | Admitting: Physician Assistant

## 2017-12-02 NOTE — Telephone Encounter (Signed)
Patient picked up samples

## 2017-12-02 NOTE — Telephone Encounter (Signed)
Call and leave message on pt voicemail Samples place at front desk for pt

## 2017-12-02 NOTE — Telephone Encounter (Signed)
New message    Patient calling the office for samples of medication:   1.  What medication and dosage are you requesting samples for?ticagrelor (BRILINTA) 90 MG TABS tablet  2.  Are you currently out of this medication? yes

## 2017-12-03 ENCOUNTER — Ambulatory Visit: Payer: Self-pay | Admitting: Physician Assistant

## 2017-12-03 ENCOUNTER — Other Ambulatory Visit: Payer: Self-pay | Admitting: *Deleted

## 2017-12-03 DIAGNOSIS — E119 Type 2 diabetes mellitus without complications: Secondary | ICD-10-CM

## 2017-12-03 MED ORDER — METFORMIN HCL 1000 MG PO TABS: 1000.0000 mg | ORAL_TABLET | Freq: Two times a day (BID) | ORAL | 0 refills | Status: DC

## 2017-12-09 ENCOUNTER — Other Ambulatory Visit: Payer: Self-pay

## 2017-12-09 DIAGNOSIS — E119 Type 2 diabetes mellitus without complications: Secondary | ICD-10-CM

## 2017-12-09 MED ORDER — METFORMIN HCL 1000 MG PO TABS: 1000.0000 mg | ORAL_TABLET | Freq: Two times a day (BID) | ORAL | 0 refills | Status: DC

## 2017-12-09 NOTE — Telephone Encounter (Signed)
Please resend to [facility]. Danley Danker, RN [facility] RN)

## 2017-12-10 ENCOUNTER — Telehealth: Payer: Self-pay | Admitting: Internal Medicine

## 2017-12-10 NOTE — Telephone Encounter (Signed)
Faxed signed prescription paper for [place] patient assistance to [phone]

## 2017-12-12 ENCOUNTER — Encounter: Payer: Self-pay | Admitting: Pharmacist

## 2017-12-12 NOTE — Progress Notes (Signed)
Request for Novolog samples, Medication Samples have been provided to the patient.  Drug name: Novolog    Strength: 100U/mL        Qty: 2  LOT: [id number]  Exp.Date: 04/02/2018  Dosing instructions: As directed per SS  The patient has been instructed regarding the correct time, dose, and frequency of taking this medication, including desired effects and most common side effects.   Janeann Forehand 11:21 AM 12/12/2017

## 2017-12-19 ENCOUNTER — Other Ambulatory Visit: Payer: Self-pay

## 2017-12-19 ENCOUNTER — Ambulatory Visit (INDEPENDENT_AMBULATORY_CARE_PROVIDER_SITE_OTHER): Payer: Self-pay | Admitting: Family Medicine

## 2017-12-19 ENCOUNTER — Encounter: Payer: Self-pay | Admitting: Family Medicine

## 2017-12-19 DIAGNOSIS — I10 Essential (primary) hypertension: Secondary | ICD-10-CM

## 2017-12-19 DIAGNOSIS — Z955 Presence of coronary angioplasty implant and graft: Secondary | ICD-10-CM

## 2017-12-19 DIAGNOSIS — E119 Type 2 diabetes mellitus without complications: Secondary | ICD-10-CM

## 2017-12-19 MED ORDER — METFORMIN HCL 1000 MG PO TABS: 500.0000 mg | ORAL_TABLET | Freq: Two times a day (BID) | ORAL | 0 refills | Status: DC

## 2017-12-19 MED ORDER — INSULIN DEGLUDEC 100 UNIT/ML ~~LOC~~ SOPN: 20.0000 [IU] | PEN_INJECTOR | Freq: Every day | SUBCUTANEOUS | 0 refills | Status: DC

## 2017-12-19 NOTE — Patient Instructions (Signed)
It was great seeing you again! I am glad that things have been going well. I apologize for the miscommunication regarding the orange card. I am giving you a sample of a long-acting insulin called tresiba. We will also increase this dosage to 20U, please only increase the dose by 1 Unit per day. Regarding the metformin, I am sorry that you are having diarrhea with this medication. We will decrease the dosage to 500mg  two times per day. Please keep your follow up with cardiology. Please let me know if you have any concerns or questions that arise.

## 2017-12-20 ENCOUNTER — Encounter: Payer: Self-pay | Admitting: Family Medicine

## 2017-12-20 DIAGNOSIS — E1159 Type 2 diabetes mellitus with other circulatory complications: Secondary | ICD-10-CM | POA: Insufficient documentation

## 2017-12-20 DIAGNOSIS — I10 Essential (primary) hypertension: Secondary | ICD-10-CM | POA: Insufficient documentation

## 2017-12-20 DIAGNOSIS — I2510 Atherosclerotic heart disease of native coronary artery without angina pectoris: Secondary | ICD-10-CM | POA: Insufficient documentation

## 2017-12-20 DIAGNOSIS — I25118 Atherosclerotic heart disease of native coronary artery with other forms of angina pectoris: Secondary | ICD-10-CM | POA: Insufficient documentation

## 2017-12-20 NOTE — Assessment & Plan Note (Signed)
Better control of sugars. Unfortunately runs out of lantus sample at today's visit. Was able to obtain 2 boxes of tresiba boxes. Increasing dose to 20U, have asked patient to titrate up 1U daily until reached 20U. Doing well with sliding scale short acting regimen. Will see back in early June for a1C check.

## 2017-12-20 NOTE — Progress Notes (Signed)
HPI 32 year old female who presents for hospital followup. In short, patient was admitted to the hospital on 3/1 and was found to have 80% stenosis of prox RCA, and distal RCA. 80% RI and 99% prox to mid LAD. She underwent stent placement of the proximal RCA, RI, and LAD. Discharged on brillinta, lantus, and aspart.  Patient last saw me on 3/7 and has been doing well since that discharge. She saw cardiology on 3/18 and she was increased to toprol 50mg . She has been getting by with samples of lantus and brillinta although she states that the medications are still far too expensive. She has filled out her orange card paperwork and she has an appointment on 4/24 so this should provide a big help.  Her blood sugars have ranged mainly in the low 200s to mid 300s. While this is certainly an improvement from pre-hospital presentation there is still room for improvement. Patient does report some mild leg swelling, especially at night. Bp initially elevated to 152/92, rechecked after 20 minutes and 141/80.  CC: hospital follow up   ROS: Review of Systems  Constitutional: Negative for chills and fever.  Respiratory: Negative for shortness of breath.   Cardiovascular: Positive for leg swelling. Negative for chest pain and palpitations.  Gastrointestinal: Negative for abdominal pain, constipation, diarrhea, nausea and vomiting.  Musculoskeletal: Negative for myalgias.  Neurological: Negative for dizziness and weakness.    Review of Systems See HPI for ROS.   CC, SH/smoking status, and VS noted  Objective: BP (!) 152/92   Pulse 98   Temp 98.7 F (37.1 C) (Oral)   Ht 5\' 3"  (1.6 m)   Wt 124.7 kg (275 lb)   LMP 12/05/2017   SpO2 98%   BMI 48.71 kg/m  Gen: NAD, alert, cooperative, and pleasant. Obese, African American female resting comfortably at edge of bed HEENT: NCAT, EOMI, PERRL CV: RRR, no murmur. Palpable peripheral pulses bilaterally. Trace lower extremity edema bilaterally. Resp:  CTAB, no wheezes, non-labored Abd: SNTND, BS present, no guarding or organomegaly Ext: No edema, warm Neuro: Alert and oriented, Speech clear, No gross deficit   Assessment and plan:  Status post coronary artery stent placement Taking her brillinta as prescribed. Will likely need brillinta for at least a year and then a lifetime plavix per cardiology recs. Also taking aspirin. Continue brillinta and aspirin, next follow up with cardiology in May.  Morbid obesity ([place]) Unfortunately appears to have gained about 10 pounds since last visit. Some of this could be due to increased fluid retention. Also patient has not scheduled appointment with nutritionist. Gave counseling for diet and exercise, reiterated importance of meeting with clinical nutritionist. Have asked patient to schedule dedicated appointment for weight loss when she wants to.  Type 2 diabetes mellitus without complication (HCC) Better control of sugars. Unfortunately runs out of lantus sample at today's visit. Was able to obtain 2 boxes of tresiba boxes. Increasing dose to 20U, have asked patient to titrate up 1U daily until reached 20U. Doing well with sliding scale short acting regimen. Will see back in early June for a1C check.  Hypertension Decently well-controlled on troprol. Unfortunately not a candidate for ace or arb as she is of child bearing age. Can likely increase B-blocker or perhaps add on thiazide if BP becomes more difficult to control.   No orders of the defined types were placed in this encounter.   Meds ordered this encounter  Medications  . DISCONTD: insulin degludec (TRESIBA FLEXTOUCH) 100  UNIT/ML SOPN FlexTouch Pen    Sig: Inject 0.2 mLs (20 Units total) into the skin daily at 10 pm.    Dispense:  2 pen    Refill:  0    Order Specific Question:   Lot Number?    Answer:   [id number]    Order Specific Question:   Expiration Date?    Answer:   07/04/2019  . metFORMIN (GLUCOPHAGE) 1000 MG tablet    Sig:  Take 0.5 tablets (500 mg total) by mouth 2 (two) times daily with a meal.    Dispense:  60 tablet    Refill:  0  . insulin degludec (TRESIBA FLEXTOUCH) 100 UNIT/ML SOPN FlexTouch Pen    Sig: Inject 0.2 mLs (20 Units total) into the skin daily.    Dispense:  2 pen    Refill:  0    Order Specific Question:   Lot Number?    Answer:   [id number]    Order Specific Question:   Expiration Date?    Answer:   07/04/2019     Guadalupe Dawn MD PGY-1 Family Medicine Resident  12/20/2017 2:45 PM

## 2017-12-20 NOTE — Assessment & Plan Note (Signed)
Taking her brillinta as prescribed. Will likely need brillinta for at least a year and then a lifetime plavix per cardiology recs. Also taking aspirin. Continue brillinta and aspirin, next follow up with cardiology in May.

## 2017-12-20 NOTE — Assessment & Plan Note (Signed)
Decently well-controlled on troprol. Unfortunately not a candidate for ace or arb as she is of child bearing age. Can likely increase B-blocker or perhaps add on thiazide if BP becomes more difficult to control.

## 2017-12-20 NOTE — Assessment & Plan Note (Signed)
Unfortunately appears to have gained about 10 pounds since last visit. Some of this could be due to increased fluid retention. Also patient has not scheduled appointment with nutritionist. Gave counseling for diet and exercise, reiterated importance of meeting with clinical nutritionist. Have asked patient to schedule dedicated appointment for weight loss when she wants to.

## 2017-12-26 ENCOUNTER — Other Ambulatory Visit: Payer: Self-pay | Admitting: Internal Medicine

## 2017-12-26 NOTE — Telephone Encounter (Signed)
Patient calling the office for samples of medication:   1.  What medication and dosage are you requesting samples for?   ticagrelor (BRILINTA) 90 MG TABS tablet Take 1 tablet (90 mg total) by mouth 2 (two) times daily.   2.  Are you currently out of this medication? 2 days left

## 2017-12-27 NOTE — Telephone Encounter (Signed)
Follow up   Pt calling to check on the request for samples because she will run out over the weekend. Please call

## 2017-12-30 ENCOUNTER — Telehealth: Payer: Self-pay | Admitting: Internal Medicine

## 2017-12-30 ENCOUNTER — Encounter: Payer: Self-pay | Admitting: Physician Assistant

## 2017-12-30 MED ORDER — TICAGRELOR 90 MG PO TABS: 90.0000 mg | ORAL_TABLET | Freq: Two times a day (BID) | ORAL | 3 refills | Status: DC

## 2017-12-30 NOTE — Telephone Encounter (Signed)
Patient calling the office for samples of medication:   1.  What medication and dosage are you requesting samples for?Brilinta 90mg  2x a day 2.  Are you currently out of this medication? yes

## 2017-12-30 NOTE — Telephone Encounter (Signed)
Duplicate encounter

## 2017-12-30 NOTE — Telephone Encounter (Signed)
Patient has been made aware that we are out of samples and that her assistance paperwork was faxed 4/9. A refill was sent today to her pharmacy. She has verbalized her understanding.

## 2017-12-30 NOTE — Telephone Encounter (Signed)
Left a message to call back. The office is currently out of samples.

## 2017-12-30 NOTE — Telephone Encounter (Signed)
Call was placed to [facility]. They stated that they had a few samples that they could get to the patient. The patient has been called and made aware.

## 2017-12-30 NOTE — Telephone Encounter (Signed)
Follow up   Pt calling again to check on her request for samples. Please call

## 2018-01-07 ENCOUNTER — Telehealth: Payer: Self-pay | Admitting: Internal Medicine

## 2018-01-07 MED ORDER — TICAGRELOR 90 MG PO TABS: 90.0000 mg | ORAL_TABLET | Freq: Two times a day (BID) | ORAL | 0 refills | Status: DC

## 2018-01-07 NOTE — Telephone Encounter (Signed)
Patient assistance application was faxed on 12/10/17. Will call to inquire about status of this.

## 2018-01-07 NOTE — Telephone Encounter (Signed)
Patient calling the office for samples of medication:   1.  What medication and dosage are you requesting samples for? Brilinta  2.  Are you currently out of this medication?  1 pill left

## 2018-01-07 NOTE — Telephone Encounter (Signed)
Spoke to patient. She states she does not hve  Drug coverage . Medication now is be $300. Patient states  primary is assisting in getting a orange card and finishing and sending patient assistance for brilinta. PATIENT STATES SHE HAS USED THE ONE TIME 30 DAY SAVINGS CARD  Patient states she has missed to 2-3 days due to unable to receive enough samples,  RN informed patient samples are available  for pick up.

## 2018-01-08 ENCOUNTER — Encounter: Payer: Self-pay | Admitting: Family Medicine

## 2018-01-08 NOTE — Telephone Encounter (Signed)
Called [employer] Brilinta patient assistance program @ [phone] to check on status of patient assistance application that was submitted almost 1 month ago - this was only the Rx info - the original application was likely submitted by a colleague. The original application was submitted by [place].  Patient ID for [employer]: [id number]  Patient assistance has not been processed yet for the following reasons: On page 4, Application said she did not have prescription drug coverage, but then she checked that she has employer furnished or private drug coverage - contradicting info - will need clarification   On page 5, missing signature from patient  _____________________ Called patient to provide an update on the status of her application. Explained all denoted above. She states she is in the process of applying for an orange card but does not yet one. She states her PCP office is supposed to call her today so she can pick up original copies of the patient assistance applications that have been submitted on her behalf. She states she will edit the sections needing adjustments per the [employer] rep and have PCP re-fax to them. Advised that patient call us back if there are any issues or if she wishes to have a new application started and submitted from our office.

## 2018-01-15 ENCOUNTER — Telehealth: Payer: Self-pay | Admitting: Pharmacist

## 2018-01-15 NOTE — Telephone Encounter (Signed)
Request for samples recieved.  Supplied Novolog and Lantus samples for pick up.   Medication Samples have been provided to the patient.  Drug name: Lantus       Strength: 100units/ml        Qty: 2 pens LOT: [id number]  Exp.Date: 06/02/2020  Drug name: Novolog Flexpens Qty: 2  LOT: [id number]  Exp.Date: 08/02/2018  Dosing instructions: Continue same dose currently and make pharmacy clinic visit for dose adjustment.   The patient has been instructed regarding the correct time, dose, and frequency of taking this medication, including desired effects and most common side effects.    Janeann Forehand 01/14/2018  10:00 AM

## 2018-01-21 ENCOUNTER — Other Ambulatory Visit: Payer: Self-pay | Admitting: Internal Medicine

## 2018-01-21 ENCOUNTER — Other Ambulatory Visit: Payer: Self-pay | Admitting: Family Medicine

## 2018-01-21 DIAGNOSIS — E119 Type 2 diabetes mellitus without complications: Secondary | ICD-10-CM

## 2018-01-21 LAB — HEPATIC FUNCTION PANEL
ALT: 14 IU/L (ref 0–32)
AST: 12 IU/L (ref 0–40)
Albumin: 3.9 g/dL (ref 3.5–5.5)
Alkaline Phosphatase: 123 IU/L — ABNORMAL HIGH (ref 39–117)
Bilirubin Total: 0.3 mg/dL (ref 0.0–1.2)
Bilirubin, Direct: 0.11 mg/dL (ref 0.00–0.40)
Total Protein: 6.9 g/dL (ref 6.0–8.5)

## 2018-01-21 LAB — LIPID PANEL
Chol/HDL Ratio: 2.5 ratio (ref 0.0–4.4)
Cholesterol, Total: 125 mg/dL (ref 100–199)
HDL: 50 mg/dL (ref >39)
LDL Calculated: 64 mg/dL (ref 0–99)
Triglycerides: 53 mg/dL (ref 0–149)
VLDL Cholesterol Cal: 11 mg/dL (ref 5–40)

## 2018-01-28 ENCOUNTER — Encounter: Payer: Self-pay | Admitting: Internal Medicine

## 2018-01-28 ENCOUNTER — Ambulatory Visit (INDEPENDENT_AMBULATORY_CARE_PROVIDER_SITE_OTHER): Payer: Self-pay | Admitting: Internal Medicine

## 2018-01-28 VITALS — BP 151/97 | HR 93 | Ht 63.0 in | Wt 282.0 lb

## 2018-01-28 DIAGNOSIS — E785 Hyperlipidemia, unspecified: Secondary | ICD-10-CM

## 2018-01-28 DIAGNOSIS — I2510 Atherosclerotic heart disease of native coronary artery without angina pectoris: Secondary | ICD-10-CM

## 2018-01-28 DIAGNOSIS — I10 Essential (primary) hypertension: Secondary | ICD-10-CM

## 2018-01-28 DIAGNOSIS — E1169 Type 2 diabetes mellitus with other specified complication: Secondary | ICD-10-CM

## 2018-01-28 DIAGNOSIS — E1065 Type 1 diabetes mellitus with hyperglycemia: Secondary | ICD-10-CM | POA: Insufficient documentation

## 2018-01-28 NOTE — Patient Instructions (Signed)
Your physician wants you to follow-up in: 6 months with Almyra Deforest, PA You will receive a reminder letter in the mail two months in advance. If you don't receive a letter, please call our office to schedule the follow-up appointment.
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
## 2018-02-05 ENCOUNTER — Other Ambulatory Visit: Payer: Self-pay | Admitting: Family Medicine

## 2018-02-05 MED ORDER — ATORVASTATIN CALCIUM 80 MG PO TABS: 80.0000 mg | ORAL_TABLET | Freq: Every day | ORAL | 0 refills | Status: DC

## 2018-02-05 MED ORDER — HYDRALAZINE HCL 25 MG PO TABS: 25.0000 mg | ORAL_TABLET | Freq: Three times a day (TID) | ORAL | 0 refills | Status: DC

## 2018-02-20 ENCOUNTER — Ambulatory Visit (INDEPENDENT_AMBULATORY_CARE_PROVIDER_SITE_OTHER): Payer: Self-pay | Admitting: Family Medicine

## 2018-02-20 ENCOUNTER — Ambulatory Visit (INDEPENDENT_AMBULATORY_CARE_PROVIDER_SITE_OTHER): Payer: Self-pay | Admitting: Pharmacist

## 2018-02-20 ENCOUNTER — Ambulatory Visit: Payer: Medicaid Other | Admitting: Family Medicine

## 2018-02-20 ENCOUNTER — Encounter: Payer: Self-pay | Admitting: Family Medicine

## 2018-02-20 ENCOUNTER — Other Ambulatory Visit: Payer: Self-pay

## 2018-02-20 ENCOUNTER — Encounter: Payer: Self-pay | Admitting: Pharmacist

## 2018-02-20 VITALS — BP 138/86 | HR 104 | Temp 98.6°F | Ht 63.0 in | Wt 282.0 lb

## 2018-02-20 DIAGNOSIS — I10 Essential (primary) hypertension: Secondary | ICD-10-CM

## 2018-02-20 DIAGNOSIS — E119 Type 2 diabetes mellitus without complications: Secondary | ICD-10-CM

## 2018-02-20 LAB — POCT GLYCOSYLATED HEMOGLOBIN (HGB A1C): HbA1c, POC (controlled diabetic range): 10.8 % — AB (ref 0.0–7.0)

## 2018-02-20 MED ORDER — INSULIN DEGLUDEC 200 UNIT/ML ~~LOC~~ SOPN: 30.0000 [IU] | PEN_INJECTOR | Freq: Every day | SUBCUTANEOUS | 0 refills | Status: DC

## 2018-02-20 MED ORDER — GABAPENTIN 100 MG PO CAPS: 100.0000 mg | ORAL_CAPSULE | Freq: Three times a day (TID) | ORAL | 1 refills | Status: DC

## 2018-02-20 MED ORDER — INSULIN ASPART 100 UNIT/ML ~~LOC~~ SOLN: 0.0000 [IU] | Freq: Three times a day (TID) | SUBCUTANEOUS | 0 refills | Status: DC

## 2018-02-20 NOTE — Patient Instructions (Signed)
It was great to see you today!  Increase Tresiba to 30 units  Continue novolog sliding scale.   F/u in 2-3 weeks with pharmacy.

## 2018-02-20 NOTE — Patient Instructions (Addendum)
It was great seeing you again today! Your A1C was 10.8 from 14.4. This is a great improvement and should be celebrated! That being said your goal is closer to 7, so we still have work to do. I am glad you were able to meet with Dr. Valentina Lucks today. Per that visit, your tresiba was increased, your short acting increase, and continue the metformin. Ideally you would be able to be on a medication called Jardiance. It has been proven to be very effective in patients wit heart disease.  The other arm of your treatment is weight loss. This includes diet and exercise. I am pleased with the exercise that you are getting. Regarding diet, I highly recommend trying to schedule an appointment with our nutritionist. She gets great results and can help identify why it is so hard to lose weight with yourself specifically.  I will start you on a very low dose of a medication called gabapentin. It will help with the nerve pain, as your nerve slowly heals. Please come back and see me in 3 months for another diabetes check.

## 2018-02-20 NOTE — Progress Notes (Signed)
Patient ID: Lauren Delgado, female   DOB: Oct 01, 1985, 32 y.o.   MRN: [id number] Reviewed: agree with Dr. Graylin Shiver documentation and management.

## 2018-02-20 NOTE — Assessment & Plan Note (Signed)
Hypertension longstanding currently uncontrolled on current antihypertensives.  BP goal = <130/90mmHg. Patient is not completely adherent with medications. Control is suboptimal due to frequency of use of hydralazine only twice daily compared to 25mg  PO q8h due to costs. -Continue metoprolol succinate 50 mg daily -Continue hydralazine 25mg  TID

## 2018-02-20 NOTE — Progress Notes (Signed)
    S:     Chief Complaint  Patient presents with  . Medication Management    T2DM    Patient arrives today with Mom and two children.  Presents for diabetes evaluation, education, and management at the request of Dr. Kris Mouton. Patient was referred on 01/08/2018.  Patient was last seen by Primary Care Provider on 01/08/2018.   Patient reports Diabetes was diagnosed in 12/20/2017.   Family/Social History: Mother also has obesity and type 2 diabetes  Insurance coverage/medication affordability: uninsured currently  Patient reports adherence with medications.  Current diabetes medications include: Novolog sliding scale, Tresiba 20 units, metformin 500mg  BID with meals Current hypertension medications include: metoprolol succinate 50 mg daily  Patient denies hypoglycemic events.  Patient reported dietary habits: Eats 2-3 meals/day  Patient-reported exercise habits: limited   Patient reports nocturia 2 times a night Patient denies self foot exams.    O:  Physical Exam  Constitutional: She appears well-developed and well-nourished.  Psychiatric: She has a normal mood and affect.  Vitals reviewed.    Review of Systems  Eyes: Negative for blurred vision.  Genitourinary: Negative for urgency.  Neurological: Negative for sensory change.  All other systems reviewed and are negative.    Lab Results  Component Value Date   HGBA1C 14.6 (H) 11/01/2017   Vitals:   02/20/18 1429  BP: 138/86  Pulse: (!) 104  SpO2: 98%    Lipid Panel     Component Value Date/Time   CHOL 125 01/21/2018 1116   TRIG 53 01/21/2018 1116   HDL 50 01/21/2018 1116   CHOLHDL 2.5 01/21/2018 1116   CHOLHDL 6.0 11/02/2017 0015   VLDL UNABLE TO CALCULATE IF TRIGLYCERIDE OVER 400 mg/dL 11/02/2017 0015   LDLCALC 64 01/21/2018 1116    Clinical ASCVD: Yes   A/P: Diabetes longstanding currently uncontrolled on current diabetes medications. Patient is  able to verbalize appropriate hypoglycemia  management plan. Patient is adherent with medication. Control is suboptimal due to diet, obesity, and insulin resistance. -Increased dose of basal insulin Tresiba (insulin degludec) from 20 to 30 units daily. -Continued on insulin aspart two or  three times daily with meals -Continued on metformin 500 mg BID. Follow-up belly pain complaint at next visit.  Consider trial OFF of metformin if belly pain continues.   -Recommended lifestyle interventions, dietary effects on glycemic control -Counseled on s/sx of and management of hypoglycemia -Next A1C anticipated sometime after 05/23/2018.   ASCVD risk - secondary prevention in patient with DM and history of CAD and NSTEMI. Last LDL is controlled at 64. ASCVD risk score is not >20%  - moderate intensity statin indicated. Aspirin is indicated.  -Continued aspirin 81 mg  -Continued atorvastatin 80 mg.   Hypertension longstanding currently uncontrolled on current antihypertensives.  BP goal = <130/38mmHg. Patient is not completely adherent with medications. Control is suboptimal due to frequency of use of hydralazine only twice daily compared to 25mg  PO q8h due to costs. -Continue metoprolol succinate 50 mg daily -Continue hydralazine 25mg  TID  Written patient instructions provided.  Total time in face to face counseling 30 minutes.   Follow up Pharmacist/PCP Clinic Visit in 2-3 weeks. Patient seen with Nida Boatman, PharmD, PGY1 Pharmacy Resident and Deirdre Pippins, PharmD, BCPS, PGY2 Pharmacy Resident.

## 2018-02-20 NOTE — Assessment & Plan Note (Signed)
Diabetes longstanding currently uncontrolled on current diabetes medications. Patient is  able to verbalize appropriate hypoglycemia management plan. Patient is adherent with medication. Control is suboptimal due to diet, obesity, and insulin resistance. -Increased dose of basal insulin Tresiba (insulin degludec) from 20 to 30 units daily. -Continued on insulin aspart two or  three times daily with meals -Continued on metformin 500 mg BID. Follow-up belly pain complaint at next visit.  Consider trial OFF of metformin if belly pain continues.   -Recommended lifestyle interventions, dietary effects on glycemic control -Counseled on s/sx of and management of hypoglycemia -Next A1C anticipated sometime after 05/23/2018.

## 2018-02-25 ENCOUNTER — Encounter: Payer: Self-pay | Admitting: Family Medicine

## 2018-02-25 MED ORDER — VALACYCLOVIR HCL 500 MG PO TABS: 500.0000 mg | ORAL_TABLET | Freq: Two times a day (BID) | ORAL | 0 refills | Status: DC

## 2018-02-25 NOTE — Assessment & Plan Note (Signed)
Patient on metformin 500mg  bid, tresiba 30U daily, and short acting as needed depending on meals (2 or 3 times per day). Patient would benefit from victoza or jardiance as these have good heart benefits and can also help lose weight. Patient will need orange card before she will be able to afford these meds.

## 2018-02-25 NOTE — Assessment & Plan Note (Signed)
Gave patient Dr. Jenne Campus' card for nutritional counseling. Patient encouraged to continue ramping up physical activity.

## 2018-02-25 NOTE — Progress Notes (Signed)
   HPI 32 year old who presents for follow up for diabetes management. Patient was seen by Dr. Valentina Lucks just prior to her visit. Her a1c is 10.8 down from 14.4. Her tresiba was increased to 30U daily, metformin 500mg  bid, aspart 2 or 3 times daily with meals. Patient has been walking some for exercise.   Patient was also continued on metoprolol 50mg  daily, hydralazine.   Patient is also now interested in nutrition consult with Dr. Jenne Campus. Encouraged patient to continued exercise.  CC: diabetes follow up   ROS:   Review of Systems See HPI for ROS.   CC, SH/smoking status, and VS noted  Objective: BP 138/86   Pulse (!) 104   Temp 98.6 F (37 C) (Oral)   Ht 5\' 3"  (1.6 m)   Wt 282 lb (127.9 kg)   SpO2 98%   BMI 49.95 kg/m  Gen: NAD, alert, cooperative, and pleasant. Obese AA female. Comfortable on table. HEENT: NCAT, EOMI, PERRL CV: RRR, no murmur Resp: CTAB, no wheezes, non-labored Abd: SNTND, BS present, no guarding or organomegaly Ext: No edema, warm Neuro: Alert and oriented, Speech clear, No gross deficits   Assessment and plan:  Type 2 diabetes mellitus without complication (HCC) Patient on metformin 500mg  bid, tresiba 30U daily, and short acting as needed depending on meals (2 or 3 times per day). Patient would benefit from victoza or jardiance as these have good heart benefits and can also help lose weight. Patient will need orange card before she will be able to afford these meds.  Morbid obesity ([place]) Gave patient Dr. Jenne Campus' card for nutritional counseling. Patient encouraged to continue ramping up physical activity.   Orders Placed This Encounter  Procedures  . POCT glycosylated hemoglobin (Hb A1C)    Meds ordered this encounter  Medications  . gabapentin (NEURONTIN) 100 MG capsule    Sig: Take 1 capsule (100 mg total) by mouth 3 (three) times daily.    Dispense:  90 capsule    Refill:  1     Guadalupe Dawn MD PGY-1 Family Medicine Resident  02/25/2018  8:51 PM

## 2018-02-25 NOTE — Telephone Encounter (Signed)
c 

## 2018-03-07 ENCOUNTER — Ambulatory Visit: Payer: Self-pay | Admitting: Pharmacist

## 2018-05-12 ENCOUNTER — Telehealth: Payer: Self-pay

## 2018-05-12 DIAGNOSIS — E119 Type 2 diabetes mellitus without complications: Secondary | ICD-10-CM

## 2018-05-12 MED ORDER — INSULIN DEGLUDEC 200 UNIT/ML ~~LOC~~ SOPN: 30.0000 [IU] | PEN_INJECTOR | Freq: Every day | SUBCUTANEOUS | 0 refills | Status: DC

## 2018-05-12 MED ORDER — INSULIN ASPART 100 UNIT/ML ~~LOC~~ SOLN: 0.0000 [IU] | Freq: Three times a day (TID) | SUBCUTANEOUS | 0 refills | Status: DC

## 2018-05-12 NOTE — Telephone Encounter (Signed)
Pt called nurse line requesting samples of insulin. Sample of Tresiba and novolog ready for pick up. VM left for patient Wallace Cullens, RN

## 2018-05-19 ENCOUNTER — Emergency Department
Admission: EM | Admit: 2018-05-19 | Discharge: 2018-05-19 | Disposition: A | Discharge status: Home / Self Care | Payer: Medicaid Other | Attending: Emergency Medicine | Admitting: Emergency Medicine

## 2018-05-19 ENCOUNTER — Encounter: Payer: Self-pay | Admitting: Emergency Medicine

## 2018-05-19 ENCOUNTER — Other Ambulatory Visit: Payer: Self-pay

## 2018-05-19 DIAGNOSIS — I10 Essential (primary) hypertension: Secondary | ICD-10-CM | POA: Insufficient documentation

## 2018-05-19 DIAGNOSIS — I252 Old myocardial infarction: Secondary | ICD-10-CM | POA: Insufficient documentation

## 2018-05-19 DIAGNOSIS — E109 Type 1 diabetes mellitus without complications: Secondary | ICD-10-CM | POA: Insufficient documentation

## 2018-05-19 DIAGNOSIS — Z794 Long term (current) use of insulin: Secondary | ICD-10-CM | POA: Insufficient documentation

## 2018-05-19 DIAGNOSIS — Z79899 Other long term (current) drug therapy: Secondary | ICD-10-CM | POA: Insufficient documentation

## 2018-05-19 DIAGNOSIS — L03012 Cellulitis of left finger: Secondary | ICD-10-CM | POA: Insufficient documentation

## 2018-05-19 DIAGNOSIS — Z7982 Long term (current) use of aspirin: Secondary | ICD-10-CM | POA: Insufficient documentation

## 2018-05-19 MED ORDER — FLUCONAZOLE 150 MG PO TABS: ORAL_TABLET | ORAL | 0 refills | Status: DC

## 2018-05-19 MED ORDER — CEPHALEXIN 500 MG PO CAPS: 500.0000 mg | ORAL_CAPSULE | Freq: Three times a day (TID) | ORAL | 0 refills | Status: DC

## 2018-05-19 MED ORDER — BACITRACIN-NEOMYCIN-POLYMYXIN 400-5-5000 EX OINT
TOPICAL_OINTMENT | Freq: Once | CUTANEOUS | Status: DC
  Filled 2018-05-19: qty 1
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
## 2018-05-19 NOTE — Discharge Instructions (Addendum)
Follow-up with your regular doctor or return emergency department if worsening.  Soak the finger in warm water with Epson salts.  Take the medication as prescribed.  If you develop a yeast infection that is when you will use the Diflucan.

## 2018-05-19 NOTE — ED Triage Notes (Signed)
L 3rd finger pian x 1 week, noted swelling around nailbed. Denies injury.

## 2018-05-19 NOTE — ED Notes (Signed)
See triage note  Presents with pain and swelling around middle finger of left hand

## 2018-05-20 ENCOUNTER — Ambulatory Visit: Payer: Medicaid Other

## 2018-07-18 ENCOUNTER — Ambulatory Visit: Payer: Self-pay | Admitting: Internal Medicine

## 2018-07-23 ENCOUNTER — Ambulatory Visit (INDEPENDENT_AMBULATORY_CARE_PROVIDER_SITE_OTHER): Payer: Self-pay | Admitting: Internal Medicine

## 2018-07-23 ENCOUNTER — Encounter: Payer: Self-pay | Admitting: Internal Medicine

## 2018-07-23 VITALS — BP 150/98 | HR 104 | Ht 62.0 in | Wt 281.6 lb

## 2018-07-23 DIAGNOSIS — E785 Hyperlipidemia, unspecified: Secondary | ICD-10-CM

## 2018-07-23 DIAGNOSIS — I10 Essential (primary) hypertension: Secondary | ICD-10-CM

## 2018-07-23 DIAGNOSIS — E1065 Type 1 diabetes mellitus with hyperglycemia: Secondary | ICD-10-CM

## 2018-07-23 DIAGNOSIS — I2510 Atherosclerotic heart disease of native coronary artery without angina pectoris: Secondary | ICD-10-CM
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
## 2018-07-23 NOTE — Patient Instructions (Addendum)
Medication Instructions:  Continue current medications 3 bottles Brilinta samples provided If you need a refill on your cardiac medications before your next appointment, please call your pharmacy.   Lab work: NONE If you have labs (blood work) drawn today and your tests are completely normal, you will receive your results only by: Marland Kitchen MyChart Message (if you have MyChart) OR . A paper copy in the mail If you have any lab test that is abnormal or we need to change your treatment, we will call you to review the results.  Testing/Procedures: NONE  Follow-Up: At [facility], you and your health needs are our priority.  As part of our continuing mission to provide you with exceptional heart care, we have created designated Provider Care Teams.  These Care Teams include your primary Cardiologist (physician) and Advanced Practice Providers (APPs -  Physician Assistants and Nurse Practitioners) who all work together to provide you with the care you need, when you need it. You will need a follow up appointment in 4 months.  Please call our office 2 months in advance to schedule this appointment.  You may see Dr. Debara Pickett or one of the following Advanced Practice Providers on your designated Care Team: Almyra Deforest, [place] . Fabian Sharp, PA-C  Any Other Special Instructions Will Be Listed Below (If Applicable).

## 2018-08-14 ENCOUNTER — Ambulatory Visit: Payer: Medicaid Other | Admitting: Family Medicine

## 2018-08-15 ENCOUNTER — Ambulatory Visit: Payer: Medicaid Other | Admitting: Family Medicine

## 2018-08-20 ENCOUNTER — Encounter: Payer: Self-pay | Admitting: Family Medicine

## 2018-08-20 ENCOUNTER — Ambulatory Visit (INDEPENDENT_AMBULATORY_CARE_PROVIDER_SITE_OTHER): Payer: Self-pay | Admitting: Family Medicine

## 2018-08-20 VITALS — BP 142/80 | HR 94 | Temp 97.7°F | Wt 274.8 lb

## 2018-08-20 DIAGNOSIS — E119 Type 2 diabetes mellitus without complications: Secondary | ICD-10-CM

## 2018-08-20 DIAGNOSIS — D1723 Benign lipomatous neoplasm of skin and subcutaneous tissue of right leg: Secondary | ICD-10-CM

## 2018-08-20 DIAGNOSIS — I10 Essential (primary) hypertension: Secondary | ICD-10-CM

## 2018-08-20 LAB — POCT GLYCOSYLATED HEMOGLOBIN (HGB A1C): HbA1c, POC (controlled diabetic range): 12.5 % — AB (ref 0.0–7.0)

## 2018-08-20 MED ORDER — GLIPIZIDE 5 MG PO TABS: 5.0000 mg | ORAL_TABLET | Freq: Every day | ORAL | 0 refills | Status: DC

## 2018-08-20 MED ORDER — INSULIN DEGLUDEC 200 UNIT/ML ~~LOC~~ SOPN: 30.0000 [IU] | PEN_INJECTOR | Freq: Every day | SUBCUTANEOUS | 0 refills | Status: DC

## 2018-08-20 NOTE — Assessment & Plan Note (Signed)
On metformin 500 mg twice daily, Tresiba 30 units, short acting with meals.  Has not been taking any of these medications.  Corresponding A1c from 10.4-12.5.  Had long discussion with patient about long-term consequences of such uncontrolled diabetes.  Patient says that metformin is continuing to cause diarrhea.  Would like switch from the medication.  Gave Tresiba sample, switch from metformin to Glucotrol.  Patient still has orange card pending.  Given uncontrolled nature will have her follow-up in pharmacy clinic with Dr. Graylin Shiver team. -For now Glucotrol 5 mg daily -Tresiba 30 units daily -Follow-up in pharmacy clinic Dr. Valentina Lucks -Follow-up in 3 months for A1c check

## 2018-08-20 NOTE — Patient Instructions (Addendum)
It was great seeing you again! I am sorry about your little knot. It is something called a lipoma. A very common proliferation of the fat cells. There is nothing to do for this. The best way to make it go away is to lose weight. Surgical removal creates a much bigger risk than just leaving it there. I gave you a recommendation for a topical cream you can apply when you are hurting.  Your A1C has gotten much worse unfortunately. I will give you a tresiba sample and switch from the metformin to glucotrol due to your side effects. I think the best option at this point is to have you see our pharmacist Dr. Valentina Lucks. Living off samples is not very sustainable long-term.   Lipoma  A lipoma is a noncancerous (benign) tumor that is made up of fat cells. This is a very common type of soft-tissue growth. Lipomas are usually found under the skin (subcutaneous). They may occur in any tissue of the body that contains fat. Common areas for lipomas to appear include the back, shoulders, buttocks, and thighs.  Lipomas grow slowly, and they are usually painless. Most lipomas do not cause problems and do not require treatment. What are the causes? The cause of this condition is not known. What increases the risk? You are more likely to develop this condition if:  You are 26-59 years old.  You have a family history of lipomas. What are the signs or symptoms? A lipoma usually appears as a small, round bump under the skin. In most cases, the lump will:  Feel soft or rubbery.  Not cause pain or other symptoms. However, if a lipoma is located in an area where it pushes on nerves, it can become painful or cause other symptoms. How is this diagnosed? A lipoma can usually be diagnosed with a physical exam. You may also have tests to confirm the diagnosis and to rule out other conditions. Tests may include:  Imaging tests, such as a CT scan or MRI.  Removal of a tissue sample to be looked at under a microscope  (biopsy). How is this treated? Treatment for this condition depends on the size of the lipoma and whether it is causing any symptoms.  For small lipomas that are not causing problems, no treatment is needed.  If a lipoma is bigger or it causes problems, surgery may be done to remove the lipoma. Lipomas can also be removed to improve appearance. Most often, the procedure is done after applying a medicine that numbs the area (local anesthetic). Follow these instructions at home:  Watch your lipoma for any changes.  Keep all follow-up visits as told by your health care provider. This is important. Contact a health care provider if:  Your lipoma becomes larger or hard.  Your lipoma becomes painful, red, or increasingly swollen. These could be signs of infection or a more serious condition. Get help right away if:  You develop tingling or numbness in an area near the lipoma. This could indicate that the lipoma is causing nerve damage. Summary  A lipoma is a noncancerous tumor that is made up of fat cells.  Most lipomas do not cause problems and do not require treatment.  If a lipoma is bigger or it causes problems, surgery may be done to remove the lipoma. This information is not intended to replace advice given to you by your health care provider. Make sure you discuss any questions you have with your health care provider. Document Released:  08/10/2002 Document Revised: 08/06/2017 Document Reviewed: 08/06/2017 Elsevier Interactive Patient Education  [employer].

## 2018-08-20 NOTE — Assessment & Plan Note (Signed)
Well-circumscribed nodule consistent with lipoma.  Recommended weight loss.  Explained that surgical options were more risky than just leaving it there.  Can try salonpas for pain.

## 2018-08-20 NOTE — Assessment & Plan Note (Signed)
Longstanding hypertension.  Systolic 370W.  Diastolic 88Q.  Patient is not a taking any blood pressure medications.  Recommend restarting metoprolol and hydralazine.

## 2018-08-20 NOTE — Progress Notes (Signed)
   HPI 32 year old who presents for right leg bump.  She noticed the bump for the last month or so.  Has slowly grown in size but is been stable for the last couple weeks.  It is moderately painful especially to palpation.  It is more of a dull ache in any kind sharp pain.  Patient also presents for diabetes management.  He has not been taking any of her diabetes medication because she is "tired of being stuck".  A1c 12.5 from 10.4.  She states that she is really not been taking any other medications aside from her Brilinta.  CC: Right leg lump   ROS:   Review of Systems See HPI for ROS.   CC, SH/smoking status, and VS noted  Objective: BP (!) 142/80   Pulse 94   Temp 97.7 F (36.5 C) (Oral)   Wt 274 lb 12.8 oz (124.6 kg)   SpO2 99%   BMI 50.26 kg/m  Gen: 32 year old female, resting comfortably on exam table, no acute distress CV: RRR, no murmur Resp: CTAB, no wheezes, non-labored Abd: SNTND, BS present, no guarding or organomegaly Ext: No edema, warm Neuro: Alert and oriented, Speech clear, No gross deficits Right leg: Small non-mobile, well-circumscribed, rubbery nodule.  Mild tenderness to palpation.  Directly over tibial bone, mid tibia.   Assessment and plan:  Essential hypertension Longstanding hypertension.  Systolic 601U.  Diastolic 93A.  Patient is not a taking any blood pressure medications.  Recommend restarting metoprolol and hydralazine.  Lipoma of right lower extremity Well-circumscribed nodule consistent with lipoma.  Recommended weight loss.  Explained that surgical options were more risky than just leaving it there.  Can try salonpas for pain.  Type 2 diabetes mellitus without complication (HCC) On metformin 500 mg twice daily, Tresiba 30 units, short acting with meals.  Has not been taking any of these medications.  Corresponding A1c from 10.4-12.5.  Had long discussion with patient about long-term consequences of such uncontrolled diabetes.  Patient says  that metformin is continuing to cause diarrhea.  Would like switch from the medication.  Gave Tresiba sample, switch from metformin to Glucotrol.  Patient still has orange card pending.  Given uncontrolled nature will have her follow-up in pharmacy clinic with Dr. Graylin Shiver team. -For now Glucotrol 5 mg daily -Tresiba 30 units daily -Follow-up in pharmacy clinic Dr. Valentina Lucks -Follow-up in 3 months for A1c check   Orders Placed This Encounter  Procedures  . HgB A1c    Meds ordered this encounter  Medications  . Insulin Degludec (TRESIBA FLEXTOUCH) 200 UNIT/ML SOPN    Sig: Inject 30 Units into the skin daily before breakfast.    Dispense:  1 pen    Refill:  0    Order Specific Question:   Lot Number?    Answer:   [id number]    Order Specific Question:   Expiration Date?    Answer:   11/01/2019    Order Specific Question:   NDC    Answer:   0254-2706-23 [[id number]]    Comments:   [id number]    Order Specific Question:   Quantity    Answer:   1  . glipiZIDE (GLUCOTROL) 5 MG tablet    Sig: Take 1 tablet (5 mg total) by mouth daily before breakfast.    Dispense:  30 tablet    Refill:  0     Guadalupe Dawn MD PGY-2 Family Medicine Resident  08/20/2018 4:00 PM

## 2018-08-30 ENCOUNTER — Emergency Department (HOSPITAL_COMMUNITY)
Admission: EM | Admit: 2018-08-30 | Discharge: 2018-08-30 | Disposition: A | Discharge status: Home / Self Care | Payer: Medicaid Other | Attending: Emergency Medicine | Admitting: Emergency Medicine

## 2018-08-30 ENCOUNTER — Emergency Department (HOSPITAL_COMMUNITY): Payer: Medicaid Other

## 2018-08-30 DIAGNOSIS — J209 Acute bronchitis, unspecified: Secondary | ICD-10-CM

## 2018-08-30 DIAGNOSIS — Z79899 Other long term (current) drug therapy: Secondary | ICD-10-CM | POA: Insufficient documentation

## 2018-08-30 DIAGNOSIS — Z7982 Long term (current) use of aspirin: Secondary | ICD-10-CM | POA: Insufficient documentation

## 2018-08-30 DIAGNOSIS — E039 Hypothyroidism, unspecified: Secondary | ICD-10-CM | POA: Insufficient documentation

## 2018-08-30 DIAGNOSIS — R072 Precordial pain: Secondary | ICD-10-CM

## 2018-08-30 DIAGNOSIS — I10 Essential (primary) hypertension: Secondary | ICD-10-CM | POA: Insufficient documentation

## 2018-08-30 LAB — BASIC METABOLIC PANEL
Anion gap: 9 (ref 5–15)
BUN: 8 mg/dL (ref 6–20)
CO2: 25 mmol/L (ref 22–32)
Calcium: 8.6 mg/dL — ABNORMAL LOW (ref 8.9–10.3)
Chloride: 109 mmol/L (ref 98–111)
Creatinine, Ser: 0.6 mg/dL (ref 0.44–1.00)
GFR calc Af Amer: >60 mL/min (ref >60)
GFR calc non Af Amer: >60 mL/min (ref >60)
Glucose, Bld: 112 mg/dL — ABNORMAL HIGH (ref 70–99)
Potassium: 3.3 mmol/L — ABNORMAL LOW (ref 3.5–5.1)
Sodium: 143 mmol/L (ref 135–145)

## 2018-08-30 LAB — CBC
HCT: 37 % (ref 36.0–46.0)
Hemoglobin: 11.9 g/dL — ABNORMAL LOW (ref 12.0–15.0)
MCH: 29.2 pg (ref 26.0–34.0)
MCHC: 32.2 g/dL (ref 30.0–36.0)
MCV: 90.9 fL (ref 80.0–100.0)
Platelets: 319 10*3/uL (ref 150–400)
RBC: 4.07 MIL/uL (ref 3.87–5.11)
RDW: 12.1 % (ref 11.5–15.5)
WBC: 7 10*3/uL (ref 4.0–10.5)
nRBC: 0 % (ref 0.0–0.2)

## 2018-08-30 LAB — I-STAT TROPONIN, ED: Troponin i, poc: 0 ng/mL (ref 0.00–0.08)

## 2018-08-30 LAB — I-STAT BETA HCG BLOOD, ED (MC, WL, AP ONLY): I-stat hCG, quantitative: <5 m[IU]/mL (ref <5)

## 2018-08-30 MED ORDER — ALBUTEROL SULFATE HFA 108 (90 BASE) MCG/ACT IN AERS
2.0000 | INHALATION_SPRAY | RESPIRATORY_TRACT | Status: DC
  Administered 2018-08-30: 2 via RESPIRATORY_TRACT
  Filled 2018-08-30: qty 6.7
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
## 2018-08-30 NOTE — ED Notes (Signed)
Patient transported to X-ray 

## 2018-09-15 ENCOUNTER — Other Ambulatory Visit: Payer: Self-pay | Admitting: Family Medicine

## 2018-09-22 ENCOUNTER — Other Ambulatory Visit: Payer: Self-pay

## 2018-09-22 MED ORDER — GLIPIZIDE 5 MG PO TABS: ORAL_TABLET | ORAL | 0 refills | Status: DC

## 2018-10-17 ENCOUNTER — Other Ambulatory Visit: Payer: Self-pay | Admitting: Physician Assistant

## 2018-11-26 ENCOUNTER — Ambulatory Visit: Payer: Self-pay | Admitting: Internal Medicine

## 2018-12-09 ENCOUNTER — Encounter: Payer: Self-pay | Admitting: Family Medicine

## 2018-12-11 MED ORDER — ONDANSETRON HCL 4 MG PO TABS: 4.0000 mg | ORAL_TABLET | Freq: Three times a day (TID) | ORAL | 0 refills | Status: DC | PRN

## 2018-12-31 ENCOUNTER — Other Ambulatory Visit: Payer: Self-pay | Admitting: Physician Assistant

## 2018-12-31 ENCOUNTER — Other Ambulatory Visit: Payer: Self-pay | Admitting: Family Medicine

## 2018-12-31 NOTE — Telephone Encounter (Signed)
Metoprolol XL 50 mg refilled.

## 2019-01-02 ENCOUNTER — Telehealth: Payer: Self-pay

## 2019-01-02 NOTE — Telephone Encounter (Signed)
Virtual Visit Pre-Appointment Phone Call  "(Elisabeth Most), I am calling you today to discuss your upcoming appointment. We are currently trying to limit exposure to the virus that causes COVID-19 by seeing patients at home rather than in the office."  1. "What is the BEST phone number to call the day of the visit?" - [phone]  2. "Do you have or have access to (through a family member/friend) a smartphone with video capability that we can use for your visit?" a. If yes - list this number in appt notes as "cell" (if different from BEST phone #) and list the appointment type as a VIDEO visit in appointment note  3. Confirm consent - "In the setting of the current Covid19 crisis, you are scheduled for a ( video) visit with your provider on (May 4) at (3:00pm).  Just as we do with many in-office visits, in order for you to participate in this visit, we must obtain consent.  If you'd like, I can send this to your mychart (if signed up) or email for you to review.  Otherwise, I can obtain your verbal consent now.  All virtual visits are billed to your insurance company just like a normal visit would be.  By agreeing to a virtual visit, we'd like you to understand that the technology does not allow for your provider to perform an examination, and thus may limit your provider's ability to fully assess your condition. If your provider identifies any concerns that need to be evaluated in person, we will make arrangements to do so.  Finally, though the technology is pretty good, we cannot assure that it will always work on either your or our end, and in the setting of a video visit, we may have to convert it to a phone-only visit.  In either situation, we cannot ensure that we have a secure connection.  Are you willing to proceed?" STAFF: Did the patient verbally acknowledge consent to telehealth visit? Document YES/NO here: YES  4. Advise patient to be prepared - "Two hours prior to your appointment,  go ahead and check your blood pressure, pulse, oxygen saturation, and your weight (if you have the equipment to check those) and write them all down. When your visit starts, your provider will ask you for this information. If you have an Apple Watch or Kardia device, please plan to have heart rate information ready on the day of your appointment. Please have a pen and paper handy nearby the day of the visit as well."  5. Give patient instructions for MyChart download to smartphone OR Doximity/Doxy.me as below if video visit (depending on what platform provider is using)  6. Inform patient they will receive a phone call 15 minutes prior to their appointment time (may be from unknown caller ID) so they should be prepared to answer    TELEPHONE CALL NOTE  Lauren Delgado has been deemed a candidate for a follow-up tele-health visit to limit community exposure during the Covid-19 pandemic. I spoke with the patient via phone to ensure availability of phone/video source, confirm preferred email & phone number, and discuss instructions and expectations.  I reminded Lauren Delgado to be prepared with any vital sign and/or heart rhythm information that could potentially be obtained via home monitoring, at the time of her visit. I reminded Lauren Delgado to expect a phone call prior to her visit.  Rhetta Mura, [place] 01/02/2019 3:27 PM   INSTRUCTIONS FOR DOWNLOADING THE [facility] APP  TO SMARTPHONE  - The patient must first make sure to have activated MyChart and know their login information - If Apple, go to [employer] and type in MyChart in the search bar and download the app. If Android, ask patient to go to [employer] and type in [facility] [place] in the search bar and download the app. The app is free but as with any other app downloads, their phone may require them to verify saved payment information or Apple/Android password.  - The patient will need to then log into the app with their MyChart  username and password, and select [facility] as their healthcare provider to link the account. When it is time for your visit, go to the MyChart app, find appointments, and click Begin Video Visit. Be sure to Select Allow for your device to access the Microphone and Camera for your visit. You will then be connected, and your provider will be with you shortly.  **If they have any issues connecting, or need assistance please contact MyChart service desk [phone])**  **If using a computer, in order to ensure the best quality for their visit they will need to use either of the following Internet Browsers: [employer], or Google Chrome**  IF USING DOXIMITY or DOXY.ME - The patient will receive a link just prior to their visit by text.     FULL LENGTH CONSENT FOR TELE-HEALTH VISIT   I hereby voluntarily request, consent and authorize [facility] and its employed or contracted physicians, physician assistants, nurse practitioners or other licensed health care professionals (the Practitioner), to provide me with telemedicine health care services (the "Services") as deemed necessary by the treating Practitioner. I acknowledge and consent to receive the Services by the Practitioner via telemedicine. I understand that the telemedicine visit will involve communicating with the Practitioner through live audiovisual communication technology and the disclosure of certain medical information by electronic transmission. I acknowledge that I have been given the opportunity to request an in-person assessment or other available alternative prior to the telemedicine visit and am voluntarily participating in the telemedicine visit.  I understand that I have the right to withhold or withdraw my consent to the use of telemedicine in the course of my care at any time, without affecting my right to future care or treatment, and that the Practitioner or I may terminate the telemedicine visit at any  time. I understand that I have the right to inspect all information obtained and/or recorded in the course of the telemedicine visit and may receive copies of available information for a reasonable fee.  I understand that some of the potential risks of receiving the Services via telemedicine include:  Marland Kitchen Delay or interruption in medical evaluation due to technological equipment failure or disruption; . Information transmitted may not be sufficient (e.g. poor resolution of images) to allow for appropriate medical decision making by the Practitioner; and/or  . In rare instances, security protocols could fail, causing a breach of personal health information.  Furthermore, I acknowledge that it is my responsibility to provide information about my medical history, conditions and care that is complete and accurate to the best of my ability. I acknowledge that Practitioner's advice, recommendations, and/or decision may be based on factors not within their control, such as incomplete or inaccurate data provided by me or distortions of diagnostic images or specimens that may result from electronic transmissions. I understand that the practice of medicine is not an exact science and that Practitioner makes no warranties  or guarantees regarding treatment outcomes. I acknowledge that I will receive a copy of this consent concurrently upon execution via email to the email address I last provided but may also request a printed copy by calling the office of [facility].    I understand that my insurance will be billed for this visit.   I have read or had this consent read to me. . I understand the contents of this consent, which adequately explains the benefits and risks of the Services being provided via telemedicine.  . I have been provided ample opportunity to ask questions regarding this consent and the Services and have had my questions answered to my satisfaction. . I give my informed consent for the services  to be provided through the use of telemedicine in my medical care  By participating in this telemedicine visit I agree to the above.

## 2019-01-02 NOTE — Telephone Encounter (Signed)
Attempted to contact pt to change appointment to virtual visit. Left message to call back. 

## 2019-01-05 ENCOUNTER — Telehealth: Payer: Self-pay | Admitting: Internal Medicine

## 2019-01-20 ENCOUNTER — Encounter: Payer: Self-pay | Admitting: Family Medicine

## 2019-01-20 ENCOUNTER — Encounter: Payer: Self-pay | Admitting: Internal Medicine

## 2019-01-20 ENCOUNTER — Other Ambulatory Visit: Payer: Self-pay

## 2019-01-20 ENCOUNTER — Ambulatory Visit (INDEPENDENT_AMBULATORY_CARE_PROVIDER_SITE_OTHER): Payer: Self-pay | Admitting: Family Medicine

## 2019-01-20 VITALS — BP 198/118 | HR 93

## 2019-01-20 DIAGNOSIS — Z955 Presence of coronary angioplasty implant and graft: Secondary | ICD-10-CM

## 2019-01-20 DIAGNOSIS — E119 Type 2 diabetes mellitus without complications: Secondary | ICD-10-CM

## 2019-01-20 DIAGNOSIS — I10 Essential (primary) hypertension: Secondary | ICD-10-CM

## 2019-01-20 LAB — POCT GLYCOSYLATED HEMOGLOBIN (HGB A1C): HbA1c, POC (controlled diabetic range): 9.2 % — AB (ref 0.0–7.0)

## 2019-01-20 MED ORDER — LISINOPRIL 10 MG PO TABS: 10.0000 mg | ORAL_TABLET | Freq: Every day | ORAL | 0 refills | Status: DC

## 2019-01-20 MED ORDER — GLIPIZIDE ER 10 MG PO TB24: 10.0000 mg | ORAL_TABLET | Freq: Every day | ORAL | 3 refills | Status: DC

## 2019-01-20 NOTE — Patient Instructions (Signed)
It was great seeing you again today!  Your A1c has improved from 12.6-9.2.  Is likely due to your dietary improvement and the glipizide.  We will increase the glipizide to 10 mg daily, asked description to your pharmacy.  Unfortunately your blood pressure is pretty high today.  I started you on a medication called lisinopril at 10 mg a day.  Please come back in 3 weeks for a blood pressure recheck.  We discussed possibly taking her IUD out, we can tackle at the next visit.  Can also address to your health maintenance items.  Your weight was 271.2 pounds today.

## 2019-01-21 LAB — CBC WITH DIFFERENTIAL/PLATELET
Basophils Absolute: 0 10*3/uL (ref 0.0–0.2)
Basos: 0 %
EOS (ABSOLUTE): 0 10*3/uL (ref 0.0–0.4)
Eos: 1 %
Hematocrit: 35.5 % (ref 34.0–46.6)
Hemoglobin: 12.1 g/dL (ref 11.1–15.9)
Immature Grans (Abs): 0 10*3/uL (ref 0.0–0.1)
Immature Granulocytes: 0 %
Lymphocytes Absolute: 1.7 10*3/uL (ref 0.7–3.1)
Lymphs: 23 %
MCH: 29.8 pg (ref 26.6–33.0)
MCHC: 34.1 g/dL (ref 31.5–35.7)
MCV: 87 fL (ref 79–97)
Monocytes Absolute: 0.4 10*3/uL (ref 0.1–0.9)
Monocytes: 6 %
Neutrophils Absolute: 5.4 10*3/uL (ref 1.4–7.0)
Neutrophils: 70 %
Platelets: 323 10*3/uL (ref 150–450)
RBC: 4.06 x10E6/uL (ref 3.77–5.28)
RDW: 13.1 % (ref 11.7–15.4)
WBC: 7.7 10*3/uL (ref 3.4–10.8)

## 2019-01-21 LAB — BASIC METABOLIC PANEL
BUN/Creatinine Ratio: 14 (ref 9–23)
BUN: 10 mg/dL (ref 6–20)
CO2: 22 mmol/L (ref 20–29)
Calcium: 9 mg/dL (ref 8.7–10.2)
Chloride: 103 mmol/L (ref 96–106)
Creatinine, Ser: 0.71 mg/dL (ref 0.57–1.00)
GFR calc Af Amer: 131 mL/min/{1.73_m2} (ref >59)
GFR calc non Af Amer: 114 mL/min/{1.73_m2} (ref >59)
Glucose: 215 mg/dL — ABNORMAL HIGH (ref 65–99)
Potassium: 4.2 mmol/L (ref 3.5–5.2)
Sodium: 139 mmol/L (ref 134–144)

## 2019-01-22 NOTE — Assessment & Plan Note (Signed)
A1c actually improved from 12.5->9.2.  Given her improvement on only the Glucotrol will increase the dose to 10 mg and give her XL form.  Patient previously has had bad nausea and diarrhea with metformin a lot take it.  Has not been taking any [place] or short acting insulin per her report.  Follow-up in 3 months.

## 2019-01-22 NOTE — Assessment & Plan Note (Signed)
Patient has been taking her Brilinta and aspirin as prescribed.  Has follow-up with cardiology after this appointment by telephone visit.

## 2019-01-22 NOTE — Assessment & Plan Note (Signed)
Weight stable, BMI 49.6.  Need to work on weight loss this is a significant barrier for health.  Discussed eating healthy and exercise.

## 2019-01-22 NOTE — Assessment & Plan Note (Signed)
Continue Toprol XL, 50 mg daily.  Will add on lisinopril 10 mg tablet daily.  Be helpful for hypertension as well as protective for her diabetes.  BMP checked, creatinine fine.

## 2019-01-22 NOTE — Progress Notes (Signed)
   HPI 33 year old African-American female who presents for A1c, hypertension follow-up.  Patient states that she has not been taking her blood sugar has not been using her Lantus.  States that she has been taking the Glucotrol 5 mg.  States she has been eating better and has had some nausea and vomiting over the last couple days which is limiting her p.o. intake.  Patient states that she has been taking her metoprolol for blood pressure.  She states that she has had "a couple" sharp episodes of substernal chest pain.  She states that these do not quite feel like her initial chest pain which prompted her to have 3 vessels stented.  She has a telephone appointment with cardiology immediately after this appointment.  BP 210/115 on presentation, somewhat reduced to 195/100 after sitting for 15 minutes.  Otherwise patient states she has been doing fine.  CC: Diabetes management   ROS:   Review of Systems See HPI for ROS.   CC, SH/smoking status, and VS noted  Objective: BP (!) 198/118   Pulse 93   SpO2 53%  Gen: 32 year old AA female, no acute distress, resting comfortably CV: RRR, no murmur. No m/r/g. Palpable peripheral pulses Resp: CTAB, no wheezes, non-labored Abd: SNTND, BS present, no guarding or organomegaly Neuro: Alert and oriented, Speech clear, No gross deficits   Assessment and plan:  Hypertension Continue Toprol XL, 50 mg daily.  Will add on lisinopril 10 mg tablet daily.  Be helpful for hypertension as well as protective for her diabetes.  BMP checked, creatinine fine.  Type 2 diabetes mellitus without complication (HCC) Y4I actually improved from 12.5->9.2.  Given her improvement on only the Glucotrol will increase the dose to 10 mg and give her XL form.  Patient previously has had bad nausea and diarrhea with metformin a lot take it.  Has not been taking any [place] or short acting insulin per her report.  Follow-up in 3 months.  Status post coronary artery stent  placement Patient has been taking her Brilinta and aspirin as prescribed.  Has follow-up with cardiology after this appointment by telephone visit.  Morbid obesity ([place]) Weight stable, BMI 49.6.  Need to work on weight loss this is a significant barrier for health.  Discussed eating healthy and exercise.   Orders Placed This Encounter  Procedures  . Basic Metabolic Panel  . CBC with Differential  . HgB A1c    Meds ordered this encounter  Medications  . glipiZIDE (GLUCOTROL XL) 10 MG 24 hr tablet    Sig: Take 1 tablet (10 mg total) by mouth daily.    Dispense:  90 tablet    Refill:  3  . lisinopril (ZESTRIL) 10 MG tablet    Sig: Take 1 tablet (10 mg total) by mouth at bedtime.    Dispense:  90 tablet    Refill:  0     Guadalupe Dawn MD PGY-2 Family Medicine Resident  01/22/2019 9:53 AM

## 2019-01-23 ENCOUNTER — Telehealth: Payer: Self-pay | Admitting: Internal Medicine

## 2019-01-23 NOTE — Telephone Encounter (Signed)
-----   Message from Fidel Levy, RN sent at 01/23/2019  9:50 AM EDT ----- Regarding: please r/s patient Patient was a no show when MD called her for virtual visit on 01/20/2019. Can she be rescheduled? Thanks

## 2019-01-23 NOTE — Telephone Encounter (Signed)
LMTCB to reschedule appt she missed with Dr. Debara Pickett, it was a 4 mo follow up visit.

## 2019-01-23 NOTE — Progress Notes (Signed)
  This encounter was created in error - please disregard. Patient registered for telehealth visit, but was not able to be reached.  Pixie Casino, MD, [facility], [place] Director of the Advanced Lipid Disorders &  Cardiovascular Risk Reduction Clinic Diplomate of the American Board of Clinical Lipidology Attending Cardiologist  Direct Dial: [phone]  Fax: [phone]  Website:  [email or web address]

## 2019-02-12 ENCOUNTER — Encounter: Payer: Self-pay | Admitting: Family Medicine

## 2019-02-12 ENCOUNTER — Other Ambulatory Visit: Payer: Self-pay

## 2019-02-12 ENCOUNTER — Ambulatory Visit (INDEPENDENT_AMBULATORY_CARE_PROVIDER_SITE_OTHER): Payer: Medicaid Other | Admitting: Family Medicine

## 2019-02-12 ENCOUNTER — Other Ambulatory Visit (HOSPITAL_COMMUNITY)
Admission: RE | Admit: 2019-02-12 | Discharge: 2019-02-12 | Disposition: A | Discharge status: Home / Self Care | Payer: Medicaid Other | Source: Ambulatory Visit | Attending: Family Medicine | Admitting: Family Medicine

## 2019-02-12 VITALS — BP 150/80 | HR 84 | Wt 271.4 lb

## 2019-02-12 DIAGNOSIS — Z0000 Encounter for general adult medical examination without abnormal findings: Secondary | ICD-10-CM

## 2019-02-12 DIAGNOSIS — I10 Essential (primary) hypertension: Secondary | ICD-10-CM

## 2019-02-12 DIAGNOSIS — Z30432 Encounter for removal of intrauterine contraceptive device: Secondary | ICD-10-CM | POA: Diagnosis not present

## 2019-02-12 DIAGNOSIS — Z309 Encounter for contraceptive management, unspecified: Secondary | ICD-10-CM | POA: Insufficient documentation

## 2019-02-12 DIAGNOSIS — Z30433 Encounter for removal and reinsertion of intrauterine contraceptive device: Secondary | ICD-10-CM | POA: Diagnosis not present

## 2019-02-12 DIAGNOSIS — Z1272 Encounter for screening for malignant neoplasm of vagina: Secondary | ICD-10-CM | POA: Diagnosis not present

## 2019-02-12 NOTE — Progress Notes (Signed)
   HPI 33 year old female who presents for blood pressure follow-up, IUD removal, Pap smear.  Patient last seen in clinic by me on 01/20/2019.  Initial blood pressure was 190/120, after allowing her to rest for 15 minutes recheck revealed a blood pressure 177/108.  She was started on 10 mg lisinopril on top of her Toprol-XL. She states that she was just now able to pick up her lisinopril 2 days ago. It is unclear why takes a long part of feels medication.  BP 150/80 in clinic.  Patient also presents for IUD removal.  Per charting IUD has been in for around 5 years.  Patient is unsure which for birth control she like to switch to.  She just knows that she does not want the IUD again.  Pap smear performed in clinic.  CC: Blood pressure follow-up   ROS:   Review of Systems See HPI for ROS.   CC, SH/smoking status, and VS noted  Objective: BP (!) 150/80   Pulse 84   Wt 271 lb 6.4 oz (123.1 kg)   SpO2 99%   BMI 49.64 kg/m  Gen: Well-appearing 33 year old African-American female, no acute distress CV: RRR, no murmur Resp: CTAB, no wheezes, non-labored Abd: SNTND, BS present, no guarding or organomegaly Neuro: Alert and oriented, Speech clear, No gross deficits Gyn: Cervix well visualized, with strings easily seen.  Able to remove IUD with minimal bleeding after removal.  Assessment and plan:  Contraception management Removed IUD.  Strings well visualized, grasped with forceps and removed with usual process.  Patient unsure which hormonal contraception she would like to replace her IUD with.  Gave patient information on most common options and she is going to think about it and schedule follow-up.  Healthcare maintenance Pap smear performed.  Diabetic foot exam performed.  Hypertension BP improved at 150/80.  Patient is only been taking for a few days.  Given that she is only been taking for few days, will leave medication regimen as is.  Can recheck blood pressure at next  appointment along with A1c. -Continue Toprol-XL 50 mg daily -Continue lisinopril 10 mg daily -Follow-up blood pressure at next appointment.   No orders of the defined types were placed in this encounter.   No orders of the defined types were placed in this encounter.    Guadalupe Dawn MD PGY-2 Family Medicine Resident  02/12/2019 4:08 PM

## 2019-02-12 NOTE — Assessment & Plan Note (Signed)
BP improved at 150/80.  Patient is only been taking for a few days.  Given that she is only been taking for few days, will leave medication regimen as is.  Can recheck blood pressure at next appointment along with A1c. -Continue Toprol-XL 50 mg daily -Continue lisinopril 10 mg daily -Follow-up blood pressure at next appointment.

## 2019-02-12 NOTE — Assessment & Plan Note (Signed)
Removed IUD.  Strings well visualized, grasped with forceps and removed with usual process.  Patient unsure which hormonal contraception she would like to replace her IUD with.  Gave patient information on most common options and she is going to think about it and schedule follow-up.

## 2019-02-12 NOTE — Assessment & Plan Note (Signed)
Pap smear performed.  Diabetic foot exam performed.

## 2019-02-12 NOTE — Patient Instructions (Signed)
It was great seeing you again! Today we did a pap smear and an IUD removal. I gave you options for contraception to think about. I will call you with the results. We will check your bp in 2 months with your a1c.

## 2019-02-17 LAB — CYTOLOGY - PAP
Diagnosis: NEGATIVE
HPV: NOT DETECTED

## 2019-02-18 ENCOUNTER — Telehealth: Payer: Self-pay | Admitting: Family Medicine

## 2019-02-18 NOTE — Telephone Encounter (Signed)
Called and gave patient results.  Lauren Delgado, [place]

## 2019-02-18 NOTE — Telephone Encounter (Signed)
Please let patient know that her pap smear came back completely normal with no concerning findings.  Guadalupe Dawn MD PGY-2 Family Medicine Resident

## 2019-02-23 ENCOUNTER — Encounter: Payer: Self-pay | Admitting: Family Medicine

## 2019-02-26 ENCOUNTER — Other Ambulatory Visit: Payer: Self-pay

## 2019-03-02 ENCOUNTER — Other Ambulatory Visit: Payer: Self-pay | Admitting: Family Medicine

## 2019-03-02 MED ORDER — NITROGLYCERIN 0.4 MG SL SUBL: 0.4000 mg | SUBLINGUAL_TABLET | SUBLINGUAL | 0 refills | Status: DC | PRN

## 2019-03-27 ENCOUNTER — Telehealth: Payer: Self-pay | Admitting: Internal Medicine

## 2019-03-27 NOTE — Telephone Encounter (Signed)
I left a message to remind  pt of her appt on 7-27=20 with Dr Debara Pickett. I also asked pt to call back to be pre-screened for COVID-19.

## 2019-03-30 ENCOUNTER — Encounter: Payer: Self-pay | Admitting: Internal Medicine

## 2019-03-30 ENCOUNTER — Other Ambulatory Visit: Payer: Self-pay

## 2019-03-30 ENCOUNTER — Ambulatory Visit (INDEPENDENT_AMBULATORY_CARE_PROVIDER_SITE_OTHER): Payer: Self-pay | Admitting: Internal Medicine

## 2019-03-30 VITALS — BP 196/107 | HR 101 | Temp 97.9°F | Ht 62.0 in | Wt 276.2 lb

## 2019-03-30 DIAGNOSIS — E1065 Type 1 diabetes mellitus with hyperglycemia: Secondary | ICD-10-CM

## 2019-03-30 DIAGNOSIS — I10 Essential (primary) hypertension: Secondary | ICD-10-CM

## 2019-03-30 DIAGNOSIS — E785 Hyperlipidemia, unspecified: Secondary | ICD-10-CM

## 2019-03-30 DIAGNOSIS — I2510 Atherosclerotic heart disease of native coronary artery without angina pectoris: Secondary | ICD-10-CM
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
## 2019-03-30 NOTE — Patient Instructions (Signed)
Medication Instructions:  STOP the Brilinta when you run out of what you have.  If you need a refill on your cardiac medications before your next appointment, please call your pharmacy.   Lab work: None ordered If you have labs (blood work) drawn today and your tests are completely normal, you will receive your results only by: Marland Kitchen MyChart Message (if you have MyChart) OR . A paper copy in the mail If you have any lab test that is abnormal or we need to change your treatment, we will call you to review the results.  Testing/Procedures: None ordered  Follow-Up: At [facility], you and your health needs are our priority.  As part of our continuing mission to provide you with exceptional heart care, we have created designated Provider Care Teams.  These Care Teams include your primary Cardiologist (physician) and Advanced Practice Providers (APPs -  Physician Assistants and Nurse Practitioners) who all work together to provide you with the care you need, when you need it. You will need a follow up appointment in 6 months.  Please call our office 2 months in advance to schedule this appointment.  You may see Pixie Casino, MD or one of the following Advanced Practice Providers on your designated Care Team: [place], [place] . Fabian Sharp, PA-C

## 2019-05-20 ENCOUNTER — Other Ambulatory Visit: Payer: Self-pay | Admitting: Family Medicine

## 2019-05-20 ENCOUNTER — Encounter: Payer: Self-pay | Admitting: Family Medicine

## 2019-05-20 MED ORDER — VALACYCLOVIR HCL 500 MG PO TABS: 500.0000 mg | ORAL_TABLET | Freq: Two times a day (BID) | ORAL | 0 refills | Status: DC

## 2019-06-29 ENCOUNTER — Ambulatory Visit (INDEPENDENT_AMBULATORY_CARE_PROVIDER_SITE_OTHER): Payer: Self-pay | Admitting: Family Medicine

## 2019-06-29 ENCOUNTER — Encounter: Payer: Self-pay | Admitting: Family Medicine

## 2019-06-29 ENCOUNTER — Other Ambulatory Visit: Payer: Self-pay

## 2019-06-29 VITALS — BP 182/100 | HR 91 | Wt 292.2 lb

## 2019-06-29 DIAGNOSIS — E119 Type 2 diabetes mellitus without complications: Secondary | ICD-10-CM

## 2019-06-29 DIAGNOSIS — I10 Essential (primary) hypertension: Secondary | ICD-10-CM

## 2019-06-29 DIAGNOSIS — E1169 Type 2 diabetes mellitus with other specified complication: Secondary | ICD-10-CM

## 2019-06-29 DIAGNOSIS — I429 Cardiomyopathy, unspecified: Secondary | ICD-10-CM

## 2019-06-29 DIAGNOSIS — I249 Acute ischemic heart disease, unspecified: Secondary | ICD-10-CM

## 2019-06-29 DIAGNOSIS — E785 Hyperlipidemia, unspecified: Secondary | ICD-10-CM

## 2019-06-29 DIAGNOSIS — G4730 Sleep apnea, unspecified: Secondary | ICD-10-CM

## 2019-06-29 LAB — POCT GLYCOSYLATED HEMOGLOBIN (HGB A1C): HbA1c, POC (controlled diabetic range): 10.7 % — AB (ref 0.0–7.0)

## 2019-06-29 MED ORDER — TRESIBA FLEXTOUCH 200 UNIT/ML ~~LOC~~ SOPN: 30.0000 [IU] | PEN_INJECTOR | Freq: Every day | SUBCUTANEOUS | 0 refills | Status: DC

## 2019-06-29 MED ORDER — TRESIBA FLEXTOUCH 200 UNIT/ML ~~LOC~~ SOPN: 15.0000 [IU] | PEN_INJECTOR | Freq: Every day | SUBCUTANEOUS | 0 refills | Status: DC

## 2019-06-29 MED ORDER — HYDROCHLOROTHIAZIDE 12.5 MG PO TABS: 12.5000 mg | ORAL_TABLET | Freq: Every day | ORAL | 0 refills | Status: DC

## 2019-06-29 NOTE — Patient Instructions (Addendum)
It was great seeing you again today!  Unfortunately your hemoglobin A1c has increased to 10.7 from 9.2.  Is a fairly significant increase.  I do think we need to get you started back on the insulin.  I gave you a sample of [place] today.  I would restart that at 15 units.  I would restart the novolog at your normal dose.  Is on a sliding scale that you take with each meal and the following way cbg70-120:0, cbg121-150:3, cbg151-200:4, cbg200-250:7, cbg251-300:11, cbg301-350:15, cbg351-400:20, cbg>400 call your doctor.  I am also going check on the status of your pulmonology referral.  I think starting you on a medication called hydrochlorothiazide 12.5 mg daily will help some with your swelling as well as your blood pressure.  Please keep good track of your sugars for the next 2 weeks.  I will see you back then and I can adjust your insulin medications and blood pressure medication as needed.

## 2019-07-01 ENCOUNTER — Other Ambulatory Visit: Payer: Self-pay | Admitting: Family Medicine

## 2019-07-02 DIAGNOSIS — G4730 Sleep apnea, unspecified: Secondary | ICD-10-CM | POA: Insufficient documentation

## 2019-07-02 DIAGNOSIS — Z9189 Other specified personal risk factors, not elsewhere classified: Secondary | ICD-10-CM | POA: Insufficient documentation

## 2019-07-02 NOTE — Assessment & Plan Note (Signed)
Continue atorvastatin 80 mg daily.  Will need recheck in November.

## 2019-07-02 NOTE — Assessment & Plan Note (Addendum)
A1c 10.7 from 9.2.  Patient has not been taking any insulin, either [place] or NovoLog.  States that she ran out as not have it refilled due to cost issues.  She is unsure on the status of her orange card which she has been trying to get for several months.  Gave patient Tyler Aas sample.  Will start back at half dose, which is 15 units of [place].  Check sugars with meals and at bedtime for next 2 weeks, and log in chart.  Like for her to bring them to next visit.  Can take sliding scale NovoLog, between 0 and 20 units 3 times a day with meals.  Please see below for scale.  Continue glipizide.  Patient states that she has bad diarrhea on metformin does not want to take that.  Is on lisinopril 10 mg daily.  Will likely need to increase medication at next appointment.

## 2019-07-02 NOTE — Assessment & Plan Note (Signed)
Doing well from this standpoint.  Now off of the [place].  Continue aspirin 81 mg daily.  A lot of room for medical optimization both blood pressure and diabetes at this time.

## 2019-07-02 NOTE — Progress Notes (Signed)
HPI 33 year old female presents for diabetes management, hypertension, and bilateral leg swelling.  A1c at 10.7 from 9.2.  Patient states that she has not been taking any insulin because she has been out has been too busy.  She has been taking the Glucotrol.  States that she has not been eating very well, her diet has consisted a lot of junk food.  She also is not getting exercise.  Patient has not been checking her sugars whatsoever.  She states that she has noticed intermittent headache.  She has not been checking her blood pressure at all.  She is still taking lisinopril 10 mg daily as well as the metoprolol 50 mg every 24 hours.  Patient has noticed a lot of bilateral lower extremity swelling.  States it is worse at the end of the day.  Does usually resolve when she elevates her legs.  She is concerned that she may have sleep apnea.  States that she frequently awakens at night.  Patient states that she snores loudly a lot of the time.  CC: Diabetes, hypertension, swelling   ROS:   Review of Systems See HPI for ROS.   CC, SH/smoking status, and VS noted  Objective: BP (!) 182/100   Pulse 91   Wt 292 lb 3.2 oz (132.5 kg)   SpO2 99%   BMI 53.44 kg/m  Gen: Pleasant 33 year old African-American female, no acute distress, resting comfortably CV: Regular rate rhythm, no M/R/G.  Trace pitting edema noted bilateral lower extremity.  Skin warm and dry.  Palpable PT/DP bilaterally. Resp: Lungs clear to auscultation bilaterally, no accessory muscle use Neuro: Alert and oriented, Speech clear, No gross deficits   Assessment and plan:  Type 2 diabetes mellitus without complication (HCC) 123456 [id number] from 9.2.  Patient has not been taking any insulin, either [place] or NovoLog.  States that she ran out as not have it refilled due to cost issues.  She is unsure on the status of her orange card which she has been trying to get for several months.  Gave patient Tyler Aas sample.  Will start back at  half dose, which is 15 units of [place].  Check sugars with meals and at bedtime for next 2 weeks, and log in chart.  Like for her to bring them to next visit.  Can take sliding scale NovoLog, between 0 and 20 units 3 times a day with meals.  Please see below for scale.  Continue glipizide.  Patient states that she has bad diarrhea on metformin does not want to take that.  Is on lisinopril 10 mg daily.  Will likely need to increase medication at next appointment.  Hyperlipidemia associated with type 2 diabetes mellitus (HCC) Continue atorvastatin 80 mg daily.  Will need recheck in November.  Morbid obesity (HCC) BMI 53.44.  Gave very brief counseling on weight loss and the importance of this and her overall health.  Would like for patient to come back in a couple weeks to more formally discuss this.  ACS (acute coronary syndrome) [facility]) Doing well from this standpoint.  Now off of the [place].  Continue aspirin 81 mg daily.  A lot of room for medical optimization both blood pressure and diabetes at this time.  Hypertension BP 182/100 with some symptoms in the last few days.  Given the patient's swelling concern need for a more acute onset medication, will add on hydrochlorothiazide 12.5 mg.  Continue lisinopril 10 mg and metoprolol xl 50 mg.  Has a lot of  room to go up on lisinopril.  We will likely increase to 20 or 30 mg at next appointment.  Would like patient to follow-up in 2 weeks or so for a BMP and blood pressure check. -Added hydralazine 12.5 mg daily -Continue lisinopril 10 mg daily, likely increase next visit -Continue metoprolol XL 50 mg daily  Sleep apnea Patient with likely sleep apnea has a 6 out of 8 stop-bang questionnaire score.  Placed referral for sleep study.  Cardiomyopathy [facility]) Last visit with cardiology back in July 2020.  No plan for follow-up echo at this time.  They advised patient to continue to work on her diabetes and hypertension.  There is a lot of room for  improvement in these areas.   Orders Placed This Encounter  Procedures  . Ambulatory referral to Sleep Studies    Referral Priority:   Routine    Referral Type:   Consultation    Referral Reason:   Specialty Services Required    Number of Visits Requested:   1  . HgB A1c    Meds ordered this encounter  Medications  . hydrochlorothiazide (HYDRODIURIL) 12.5 MG tablet    Sig: Take 1 tablet (12.5 mg total) by mouth daily.    Dispense:  30 tablet    Refill:  0  . DISCONTD: Insulin Degludec (TRESIBA FLEXTOUCH) 200 UNIT/ML SOPN    Sig: Inject 30 Units into the skin daily before breakfast.    Dispense:  1 pen    Refill:  0    Order Specific Question:   Lot Number?    Answer:   [id number]    Order Specific Question:   Expiration Date?    Answer:   11/01/2019    Order Specific Question:   NDC    Answer:   [id number]    Comments:   [id number]    Order Specific Question:   Quantity    Answer:   1  . Insulin Degludec (TRESIBA FLEXTOUCH) 200 UNIT/ML SOPN    Sig: Inject 16 Units into the skin daily before breakfast.    Dispense:  1 pen    Refill:  0    Order Specific Question:   Lot Number?    Answer:   [id number]    Order Specific Question:   Expiration Date?    Answer:   10/03/2020    Order Specific Question:   Quantity    Answer:   1     Guadalupe Dawn MD PGY-3 Family Medicine Resident  07/02/2019 9:18 PM

## 2019-07-02 NOTE — Assessment & Plan Note (Signed)
BMI 53.44.  Gave very brief counseling on weight loss and the importance of this and her overall health.  Would like for patient to come back in a couple weeks to more formally discuss this.

## 2019-07-02 NOTE — Assessment & Plan Note (Signed)
Patient with likely sleep apnea has a 6 out of 8 stop-bang questionnaire score.  Placed referral for sleep study.

## 2019-07-02 NOTE — Assessment & Plan Note (Signed)
Last visit with cardiology back in July 2020.  No plan for follow-up echo at this time.  They advised patient to continue to work on her diabetes and hypertension.  There is a lot of room for improvement in these areas.

## 2019-07-02 NOTE — Assessment & Plan Note (Signed)
BP 182/100 with some symptoms in the last few days.  Given the patient's swelling concern need for a more acute onset medication, will add on hydrochlorothiazide 12.5 mg.  Continue lisinopril 10 mg and metoprolol xl 50 mg.  Has a lot of room to go up on lisinopril.  We will likely increase to 20 or 30 mg at next appointment.  Would like patient to follow-up in 2 weeks or so for a BMP and blood pressure check. -Added hydralazine 12.5 mg daily -Continue lisinopril 10 mg daily, likely increase next visit -Continue metoprolol XL 50 mg daily

## 2019-07-21 ENCOUNTER — Encounter: Payer: Self-pay | Admitting: Emergency Medicine

## 2019-07-21 ENCOUNTER — Emergency Department: Payer: Medicaid Other

## 2019-07-21 ENCOUNTER — Other Ambulatory Visit: Payer: Self-pay

## 2019-07-21 ENCOUNTER — Emergency Department
Admission: EM | Admit: 2019-07-21 | Discharge: 2019-07-21 | Disposition: A | Discharge status: Home / Self Care | Payer: Medicaid Other | Attending: Emergency Medicine | Admitting: Emergency Medicine

## 2019-07-21 DIAGNOSIS — Z79899 Other long term (current) drug therapy: Secondary | ICD-10-CM | POA: Diagnosis not present

## 2019-07-21 DIAGNOSIS — I10 Essential (primary) hypertension: Secondary | ICD-10-CM | POA: Diagnosis not present

## 2019-07-21 DIAGNOSIS — E119 Type 2 diabetes mellitus without complications: Secondary | ICD-10-CM | POA: Insufficient documentation

## 2019-07-21 DIAGNOSIS — J189 Pneumonia, unspecified organism: Secondary | ICD-10-CM | POA: Diagnosis not present

## 2019-07-21 DIAGNOSIS — I252 Old myocardial infarction: Secondary | ICD-10-CM | POA: Diagnosis not present

## 2019-07-21 DIAGNOSIS — Z20828 Contact with and (suspected) exposure to other viral communicable diseases: Secondary | ICD-10-CM | POA: Diagnosis not present

## 2019-07-21 DIAGNOSIS — Z794 Long term (current) use of insulin: Secondary | ICD-10-CM | POA: Insufficient documentation

## 2019-07-21 DIAGNOSIS — R0602 Shortness of breath: Secondary | ICD-10-CM | POA: Insufficient documentation

## 2019-07-21 DIAGNOSIS — Z955 Presence of coronary angioplasty implant and graft: Secondary | ICD-10-CM | POA: Insufficient documentation

## 2019-07-21 DIAGNOSIS — F1210 Cannabis abuse, uncomplicated: Secondary | ICD-10-CM | POA: Insufficient documentation

## 2019-07-21 DIAGNOSIS — Z7982 Long term (current) use of aspirin: Secondary | ICD-10-CM | POA: Diagnosis not present

## 2019-07-21 LAB — CBC WITH DIFFERENTIAL/PLATELET
Abs Immature Granulocytes: 0.06 10*3/uL (ref 0.00–0.07)
Basophils Absolute: 0 10*3/uL (ref 0.0–0.1)
Basophils Relative: 0 %
Eosinophils Absolute: 0 10*3/uL (ref 0.0–0.5)
Eosinophils Relative: 0 %
HCT: 34.9 % — ABNORMAL LOW (ref 36.0–46.0)
Hemoglobin: 11.5 g/dL — ABNORMAL LOW (ref 12.0–15.0)
Immature Granulocytes: 0 %
Lymphocytes Relative: 13 %
Lymphs Abs: 1.8 10*3/uL (ref 0.7–4.0)
MCH: 29.6 pg (ref 26.0–34.0)
MCHC: 33 g/dL (ref 30.0–36.0)
MCV: 89.9 fL (ref 80.0–100.0)
Monocytes Absolute: 0.6 10*3/uL (ref 0.1–1.0)
Monocytes Relative: 4 %
Neutro Abs: 11.4 10*3/uL — ABNORMAL HIGH (ref 1.7–7.7)
Neutrophils Relative %: 83 %
Platelets: 359 10*3/uL (ref 150–400)
RBC: 3.88 MIL/uL (ref 3.87–5.11)
RDW: 12.2 % (ref 11.5–15.5)
WBC: 13.9 10*3/uL — ABNORMAL HIGH (ref 4.0–10.5)
nRBC: 0 % (ref 0.0–0.2)

## 2019-07-21 LAB — COMPREHENSIVE METABOLIC PANEL
ALT: 12 U/L (ref 0–44)
AST: 13 U/L — ABNORMAL LOW (ref 15–41)
Albumin: 3.2 g/dL — ABNORMAL LOW (ref 3.5–5.0)
Alkaline Phosphatase: 97 U/L (ref 38–126)
Anion gap: 11 (ref 5–15)
BUN: 18 mg/dL (ref 6–20)
CO2: 23 mmol/L (ref 22–32)
Calcium: 8.8 mg/dL — ABNORMAL LOW (ref 8.9–10.3)
Chloride: 102 mmol/L (ref 98–111)
Creatinine, Ser: 0.66 mg/dL (ref 0.44–1.00)
GFR calc Af Amer: >60 mL/min (ref >60)
GFR calc non Af Amer: >60 mL/min (ref >60)
Glucose, Bld: 403 mg/dL — ABNORMAL HIGH (ref 70–99)
Potassium: 3.7 mmol/L (ref 3.5–5.1)
Sodium: 136 mmol/L (ref 135–145)
Total Bilirubin: 0.6 mg/dL (ref 0.3–1.2)
Total Protein: 7.3 g/dL (ref 6.5–8.1)

## 2019-07-21 LAB — TROPONIN I (HIGH SENSITIVITY)
Troponin I (High Sensitivity): 13 ng/L (ref <18)
Troponin I (High Sensitivity): 14 ng/L (ref <18)

## 2019-07-21 MED ORDER — AZITHROMYCIN 250 MG PO TABS: ORAL_TABLET | ORAL | 0 refills | Status: DC
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
## 2019-07-21 NOTE — Discharge Instructions (Addendum)
We believe that your symptoms are caused today by pneumonia, an infection in your lung(s).  Fortunately you should start to improve quickly after taking your antibiotics.  Please take the full course of antibiotics as prescribed and drink plenty of fluids.  COVID-19 is also possible but it seems less likely based on your symptoms, your chest x-ray, and your lab work.  However, if we obtained a swab, you need to check in my chart over the next couple of days to find the result, which will be reported as either "detected" or "positive" if the virus is detected, or "not detected" or "negative" if it is not detected.  Either way, I recommend that you avoid other people until you are feeling better and stay isolated within your family unit.  Follow up with your doctor within 1-2 days.  If you develop any new or worsening symptoms, including but not limited to fever in spite of taking over-the-counter ibuprofen and/or Tylenol, persistent vomiting, worsening shortness of breath, or other symptoms that concern you, please return to the Emergency Department immediately.

## 2019-07-21 NOTE — ED Notes (Signed)
Pt refused covid swab. °

## 2019-07-21 NOTE — ED Triage Notes (Signed)
Pt to triage via w/c with no distress noted; pt reports SHOB since yesterday; denies any recent illness

## 2019-08-07 ENCOUNTER — Encounter: Payer: Self-pay | Admitting: Family Medicine

## 2019-08-09 ENCOUNTER — Other Ambulatory Visit: Payer: Self-pay | Admitting: Family Medicine

## 2019-08-18 ENCOUNTER — Encounter: Payer: Self-pay | Admitting: Family Medicine

## 2019-09-18 ENCOUNTER — Ambulatory Visit (INDEPENDENT_AMBULATORY_CARE_PROVIDER_SITE_OTHER): Payer: Medicaid Other | Admitting: Physician Assistant

## 2019-09-18 ENCOUNTER — Encounter: Payer: Self-pay | Admitting: Physician Assistant

## 2019-09-18 ENCOUNTER — Other Ambulatory Visit: Payer: Self-pay

## 2019-09-18 VITALS — BP 186/111 | HR 90 | Ht 62.0 in | Wt 298.0 lb

## 2019-09-18 DIAGNOSIS — I10 Essential (primary) hypertension: Secondary | ICD-10-CM | POA: Diagnosis not present

## 2019-09-18 DIAGNOSIS — I2510 Atherosclerotic heart disease of native coronary artery without angina pectoris: Secondary | ICD-10-CM

## 2019-09-18 DIAGNOSIS — E785 Hyperlipidemia, unspecified: Secondary | ICD-10-CM | POA: Diagnosis not present

## 2019-09-18 DIAGNOSIS — Z794 Long term (current) use of insulin: Secondary | ICD-10-CM | POA: Diagnosis not present

## 2019-09-18 DIAGNOSIS — R079 Chest pain, unspecified: Secondary | ICD-10-CM | POA: Diagnosis not present

## 2019-09-18 DIAGNOSIS — E119 Type 2 diabetes mellitus without complications: Secondary | ICD-10-CM | POA: Diagnosis not present

## 2019-09-18 MED ORDER — CLOPIDOGREL BISULFATE 75 MG PO TABS: 75.0000 mg | ORAL_TABLET | Freq: Every day | ORAL | 1 refills | Status: DC

## 2019-09-18 MED ORDER — ISOSORBIDE MONONITRATE ER 30 MG PO TB24: 30.0000 mg | ORAL_TABLET | Freq: Every day | ORAL | 1 refills | Status: DC

## 2019-09-18 NOTE — Patient Instructions (Addendum)
Medication Instructions:   START PLAVIX- 1ST DAY TAKE 300 MG (4 TABLETS), THEN ON DAY 2 AND AFTER TAKE 75 MG (1 TABLET) DAILY  START IMDUR 30 MG DAILY  *If you need a refill on your cardiac medications before your next appointment, please call your pharmacy*  Lab Work: NONE ordered at this time of appointment   If you have labs (blood work) drawn today and your tests are completely normal, you will receive your results only by: Marland Kitchen MyChart Message (if you have MyChart) OR . A paper copy in the mail If you have any lab test that is abnormal or we need to change your treatment, we will call you to review the results.  Testing/Procedures: Your physician has requested that you have a lexiscan myoview. For further information please visit [email or web address]. Please follow instruction sheet, as given. THIS TEST IS DONE AT [facility]   PLEASE SCHEDULE FOR 2 WEEKS  Follow-Up: At [facility], you and your health needs are our priority.  As part of our continuing mission to provide you with exceptional heart care, we have created designated Provider Care Teams.  These Care Teams include your primary Cardiologist (physician) and Advanced Practice Providers (APPs -  Physician Assistants and Nurse Practitioners) who all work together to provide you with the care you need, when you need it.  Your next appointment:   2-3 week(s)  The format for your next appointment:   In Person  Provider:   Almyra Deforest, PA-C  Other Instructions
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
## 2019-09-20 ENCOUNTER — Encounter: Payer: Self-pay | Admitting: Physician Assistant

## 2019-09-22 ENCOUNTER — Other Ambulatory Visit: Payer: Self-pay | Admitting: Family Medicine

## 2019-09-22 DIAGNOSIS — E119 Type 2 diabetes mellitus without complications: Secondary | ICD-10-CM

## 2019-09-22 MED ORDER — TRESIBA FLEXTOUCH 200 UNIT/ML ~~LOC~~ SOPN: 15.0000 [IU] | PEN_INJECTOR | Freq: Every day | SUBCUTANEOUS | 0 refills | Status: DC

## 2019-09-24 ENCOUNTER — Telehealth (HOSPITAL_COMMUNITY): Payer: Self-pay | Admitting: *Deleted

## 2019-09-24 NOTE — Telephone Encounter (Signed)
Left message on voicemail in reference to upcoming appointment scheduled for 09/30/19. Phone number given for a call back so details instructions can be given.  Kirstie Peri

## 2019-09-25 ENCOUNTER — Encounter: Payer: Self-pay | Admitting: Family Medicine

## 2019-09-28 ENCOUNTER — Ambulatory Visit: Payer: Medicaid Other | Admitting: Internal Medicine

## 2019-09-28 ENCOUNTER — Telehealth (HOSPITAL_COMMUNITY): Payer: Self-pay | Admitting: *Deleted

## 2019-09-28 NOTE — Telephone Encounter (Signed)
Patient given detailed instructions per Myocardial Perfusion Study Information Sheet for the test on 09/30/19 Patient notified to arrive 15 minutes early and that it is imperative to arrive on time for appointment to keep from having the test rescheduled.  If you need to cancel or reschedule your appointment, please call the office within 24 hours of your appointment. . Patient verbalized understanding. Lauren Delgado

## 2019-09-29 ENCOUNTER — Other Ambulatory Visit: Payer: Self-pay | Admitting: Family Medicine

## 2019-09-29 MED ORDER — INSULIN GLARGINE 100 UNIT/ML ~~LOC~~ SOLN: 16.0000 [IU] | SUBCUTANEOUS | 11 refills | Status: DC

## 2019-09-29 NOTE — Progress Notes (Signed)
Switched tresiba to lantus due to [facility] formulary

## 2019-09-30 ENCOUNTER — Other Ambulatory Visit: Payer: Self-pay

## 2019-09-30 ENCOUNTER — Ambulatory Visit (HOSPITAL_COMMUNITY): Payer: Medicaid Other | Attending: Internal Medicine

## 2019-09-30 ENCOUNTER — Other Ambulatory Visit: Payer: Self-pay | Admitting: Family Medicine

## 2019-09-30 DIAGNOSIS — R079 Chest pain, unspecified: Secondary | ICD-10-CM | POA: Insufficient documentation

## 2019-09-30 DIAGNOSIS — E119 Type 2 diabetes mellitus without complications: Secondary | ICD-10-CM

## 2019-09-30 MED ORDER — TECHNETIUM TC 99M TETROFOSMIN IV KIT
30.1000 | PACK | Freq: Once | INTRAVENOUS | Status: AC | PRN
  Administered 2019-09-30: 30.1 via INTRAVENOUS
  Filled 2019-09-30: qty 31

## 2019-09-30 MED ORDER — INSULIN ASPART 100 UNIT/ML ~~LOC~~ SOLN: 0.0000 [IU] | Freq: Three times a day (TID) | SUBCUTANEOUS | 1 refills | Status: DC

## 2019-09-30 MED ORDER — REGADENOSON 0.4 MG/5ML IV SOLN
0.4000 mg | Freq: Once | INTRAVENOUS | Status: AC
  Administered 2019-09-30: 0.4 mg via INTRAVENOUS

## 2019-10-01 ENCOUNTER — Other Ambulatory Visit: Payer: Self-pay | Admitting: *Deleted

## 2019-10-01 ENCOUNTER — Ambulatory Visit (HOSPITAL_COMMUNITY): Payer: Medicaid Other | Attending: Surgery

## 2019-10-01 DIAGNOSIS — E119 Type 2 diabetes mellitus without complications: Secondary | ICD-10-CM

## 2019-10-01 LAB — MYOCARDIAL PERFUSION IMAGING
LV dias vol: 113 mL (ref 46–106)
LV sys vol: 50 mL
Peak HR: 117 {beats}/min
Rest HR: 99 {beats}/min
SDS: 8
SRS: 10
SSS: 22
TID: 0.91

## 2019-10-01 MED ORDER — INSULIN ASPART 100 UNIT/ML ~~LOC~~ SOLN: 0.0000 [IU] | Freq: Three times a day (TID) | SUBCUTANEOUS | 1 refills | Status: DC

## 2019-10-01 MED ORDER — TECHNETIUM TC 99M TETROFOSMIN IV KIT
32.3000 | PACK | Freq: Once | INTRAVENOUS | Status: AC | PRN
  Administered 2019-10-01: 32.3 via INTRAVENOUS
  Filled 2019-10-01: qty 33

## 2019-10-07 ENCOUNTER — Other Ambulatory Visit: Payer: Self-pay

## 2019-10-07 ENCOUNTER — Ambulatory Visit: Payer: Medicaid Other | Admitting: Physician Assistant

## 2019-10-07 ENCOUNTER — Encounter: Payer: Self-pay | Admitting: Physician Assistant

## 2019-10-07 VITALS — BP 196/116 | HR 100 | Temp 98.1°F | Ht 62.0 in | Wt 298.8 lb

## 2019-10-07 DIAGNOSIS — E1169 Type 2 diabetes mellitus with other specified complication: Secondary | ICD-10-CM | POA: Diagnosis not present

## 2019-10-07 DIAGNOSIS — E785 Hyperlipidemia, unspecified: Secondary | ICD-10-CM

## 2019-10-07 DIAGNOSIS — E119 Type 2 diabetes mellitus without complications: Secondary | ICD-10-CM

## 2019-10-07 DIAGNOSIS — R0683 Snoring: Secondary | ICD-10-CM

## 2019-10-07 DIAGNOSIS — I10 Essential (primary) hypertension: Secondary | ICD-10-CM

## 2019-10-07 DIAGNOSIS — I25118 Atherosclerotic heart disease of native coronary artery with other forms of angina pectoris: Secondary | ICD-10-CM | POA: Diagnosis not present

## 2019-10-07 DIAGNOSIS — Z794 Long term (current) use of insulin: Secondary | ICD-10-CM | POA: Diagnosis not present

## 2019-10-07 MED ORDER — METOPROLOL SUCCINATE ER 50 MG PO TB24: 50.0000 mg | ORAL_TABLET | Freq: Two times a day (BID) | ORAL | 3 refills | Status: DC

## 2019-10-07 MED ORDER — ISOSORBIDE MONONITRATE ER 60 MG PO TB24: 60.0000 mg | ORAL_TABLET | Freq: Every day | ORAL | 3 refills | Status: DC
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
## 2019-10-07 NOTE — Patient Instructions (Signed)
Medication Instructions:   Increase Isosorbide to 60 mg daily.  Change Lisinopril---take in the evening.  Change Metoprolol Succinate 50 mg to twice daily.  *If you need a refill on your cardiac medications before your next appointment, please call your pharmacy*  Lab Work: Your physician recommends that you return for a FASTING lipid profile and Lipoprotein A at your earliest convenience. Nothing to eat or drink after midnight the night before.  If you have labs (blood work) drawn today and your tests are completely normal, you will receive your results only by: Marland Kitchen MyChart Message (if you have MyChart) OR . A paper copy in the mail If you have any lab test that is abnormal or we need to change your treatment, we will call you to review the results.  Testing/Procedures: Your physician has recommended that you have a sleep study. This test records several body functions during sleep, including: brain activity, eye movement, oxygen and carbon dioxide blood levels, heart rate and rhythm, breathing rate and rhythm, the flow of air through your mouth and nose, snoring, body muscle movements, and chest and belly movement. Mariann Laster will call you to schedule.   Follow-Up: At [facility], you and your health needs are our priority.  As part of our continuing mission to provide you with exceptional heart care, we have created designated Provider Care Teams.  These Care Teams include your primary Cardiologist (physician) and Advanced Practice Providers (APPs -  Physician Assistants and Nurse Practitioners) who all work together to provide you with the care you need, when you need it.  Your next appointment:   2-3 week(s)  The format for your next appointment:   In Person  Provider:   Almyra Deforest, PA   Please also schedule a 3 month follow-up with Dr. Debara Pickett.

## 2019-10-08 ENCOUNTER — Telehealth: Payer: Self-pay | Admitting: *Deleted

## 2019-10-08 NOTE — Telephone Encounter (Signed)
Spoke with pt she states that she is unable to come to scheduled Lauren Delgado visit because she will be quarantining for her sleep study. This appt has been rescheduled until after sleep study. Pt will need notes for work for these absences (the date of the COVID and sleep testing)

## 2019-10-08 NOTE — Telephone Encounter (Signed)
Sleep study appointment details sent via e chart

## 2019-10-10 ENCOUNTER — Encounter: Payer: Self-pay | Admitting: Physician Assistant

## 2019-10-13 NOTE — Telephone Encounter (Signed)
That is fine with me.

## 2019-10-13 NOTE — Progress Notes (Signed)
Looks like the appt was rescheduled. She was responding to the increase of Imdur before, will continue to uptitrate antianginal medication on the next followup

## 2019-10-20 ENCOUNTER — Other Ambulatory Visit (HOSPITAL_COMMUNITY)
Admission: RE | Admit: 2019-10-20 | Discharge: 2019-10-20 | Disposition: A | Discharge status: Home / Self Care | Payer: Medicaid Other | Source: Ambulatory Visit | Attending: Cardiovascular Disease | Admitting: Cardiovascular Disease

## 2019-10-20 DIAGNOSIS — Z20822 Contact with and (suspected) exposure to covid-19: Secondary | ICD-10-CM | POA: Diagnosis not present

## 2019-10-20 DIAGNOSIS — Z01812 Encounter for preprocedural laboratory examination: Secondary | ICD-10-CM | POA: Diagnosis not present

## 2019-10-20 LAB — SARS CORONAVIRUS 2 (TAT 6-24 HRS): SARS Coronavirus 2: NEGATIVE

## 2019-10-21 ENCOUNTER — Ambulatory Visit: Payer: Medicaid Other | Admitting: Physician Assistant

## 2019-10-22 ENCOUNTER — Encounter (HOSPITAL_BASED_OUTPATIENT_CLINIC_OR_DEPARTMENT_OTHER): Payer: Medicaid Other | Admitting: Cardiovascular Disease

## 2019-10-24 ENCOUNTER — Other Ambulatory Visit: Payer: Self-pay | Admitting: Family Medicine

## 2019-10-24 ENCOUNTER — Ambulatory Visit (HOSPITAL_BASED_OUTPATIENT_CLINIC_OR_DEPARTMENT_OTHER): Payer: Medicaid Other | Attending: Physician Assistant | Admitting: Cardiovascular Disease

## 2019-10-24 ENCOUNTER — Other Ambulatory Visit: Payer: Self-pay

## 2019-10-24 DIAGNOSIS — G4734 Idiopathic sleep related nonobstructive alveolar hypoventilation: Secondary | ICD-10-CM | POA: Diagnosis not present

## 2019-10-24 DIAGNOSIS — R0683 Snoring: Secondary | ICD-10-CM | POA: Diagnosis not present

## 2019-10-24 DIAGNOSIS — I25118 Atherosclerotic heart disease of native coronary artery with other forms of angina pectoris: Secondary | ICD-10-CM | POA: Diagnosis not present

## 2019-10-24 DIAGNOSIS — G4730 Sleep apnea, unspecified: Secondary | ICD-10-CM

## 2019-10-24 DIAGNOSIS — G4719 Other hypersomnia: Secondary | ICD-10-CM | POA: Diagnosis not present

## 2019-10-27 ENCOUNTER — Encounter: Payer: Self-pay | Admitting: Physician Assistant

## 2019-10-27 ENCOUNTER — Telehealth (INDEPENDENT_AMBULATORY_CARE_PROVIDER_SITE_OTHER): Payer: Medicaid Other | Admitting: Physician Assistant

## 2019-10-27 ENCOUNTER — Telehealth: Payer: Self-pay

## 2019-10-27 VITALS — Ht 62.0 in | Wt 292.0 lb

## 2019-10-27 DIAGNOSIS — E785 Hyperlipidemia, unspecified: Secondary | ICD-10-CM

## 2019-10-27 DIAGNOSIS — I25118 Atherosclerotic heart disease of native coronary artery with other forms of angina pectoris: Secondary | ICD-10-CM

## 2019-10-27 DIAGNOSIS — R0683 Snoring: Secondary | ICD-10-CM | POA: Diagnosis not present

## 2019-10-27 DIAGNOSIS — I10 Essential (primary) hypertension: Secondary | ICD-10-CM

## 2019-10-27 DIAGNOSIS — E119 Type 2 diabetes mellitus without complications: Secondary | ICD-10-CM | POA: Diagnosis not present

## 2019-10-27 DIAGNOSIS — I255 Ischemic cardiomyopathy: Secondary | ICD-10-CM | POA: Diagnosis not present

## 2019-10-27 MED ORDER — ISOSORBIDE MONONITRATE ER 60 MG PO TB24: 90.0000 mg | ORAL_TABLET | Freq: Every day | ORAL | 3 refills | Status: DC
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
## 2019-10-27 NOTE — Patient Instructions (Addendum)
Medication Instructions:   INCREASE IMDUR TO 90 MG (1.5 tablets) DAILY  *If you need a refill on your cardiac medications before your next appointment, please call your pharmacy*  Lab Work: NONE ordered at this time of appointment   If you have labs (blood work) drawn today and your tests are completely normal, you will receive your results only by: Marland Kitchen MyChart Message (if you have MyChart) OR . A paper copy in the mail If you have any lab test that is abnormal or we need to change your treatment, we will call you to review the results.  Testing/Procedures: NONE ordered at this time of appointment   Follow-Up: At [facility], you and your health needs are our priority.  As part of our continuing mission to provide you with exceptional heart care, we have created designated Provider Care Teams.  These Care Teams include your primary Cardiologist (physician) and Advanced Practice Providers (APPs -  Physician Assistants and Nurse Practitioners) who all work together to provide you with the care you need, when you need it.  Your next appointment:   1 month(s) November 27, 2019  The format for your next appointment:   In Person  Provider:   K. Mali [name], MD  Other Instructions

## 2019-10-27 NOTE — Telephone Encounter (Signed)
Called and left a voice message for the patient informing her that I was calling to get her prepared for her virtual visit with Almyra Deforest, PA-C to give our office a call or that I will be giving her a call again.

## 2019-11-01 NOTE — Procedures (Signed)
Patient Name: Lauren Delgado, Lauren Delgado Date: 10/24/2019 Gender: Female D.O.B: Apr 10, 1986 Age (years): 33 Referring Provider: Almyra Deforest PA Height (inches): 62 Interpreting Physician: Shelva Majestic MD, ABSM Weight (lbs): 298 RPSGT: Lanae Boast BMI: 54 MRN: [id number] Neck Size: 15.50  CLINICAL INFORMATION Sleep Study Type: NPSG  Indication for sleep study: Obesity, Snoring, Uncontrolled Hypertension  Epworth Sleepiness Score: 15  SLEEP STUDY TECHNIQUE As per the AASM Manual for the Scoring of Sleep and Associated Events v2.3 (April 2016) with a hypopnea requiring 4% desaturations.  The channels recorded and monitored were frontal, central and occipital EEG, electrooculogram (EOG), submentalis EMG (chin), nasal and oral airflow, thoracic and abdominal wall motion, anterior tibialis EMG, snore microphone, electrocardiogram, and pulse oximetry.  MEDICATIONS aspirin 81 MG chewable tablet  atorvastatin (LIPITOR) 80 MG tablet  clopidogrel (PLAVIX) 75 MG tablet  glipiZIDE (GLUCOTROL XL) 10 MG 24 hr tablet  hydrALAZINE (APRESOLINE) 25 MG tablet  hydrochlorothiazide (HYDRODIURIL) 12.5 MG tablet  insulin aspart (NOVOLOG) 100 UNIT/ML injection  insulin glargine (LANTUS) 100 UNIT/ML injection  isosorbide mononitrate (IMDUR) 60 MG 24 hr tablet  lisinopril (ZESTRIL) 10 MG tablet  metoprolol succinate (TOPROL-XL) 50 MG 24 hr tablet  Multiple Vitamins-Minerals (MULTI ADULT GUMMIES PO)  nitroGLYCERIN (NITROSTAT) 0.4 MG SL tablet  valACYclovir (VALTREX) 500 MG tablet   Medications self-administered by patient taken the night of the study : N/A  SLEEP ARCHITECTURE The study was initiated at 11:16:46 PM and ended at 5:23:06 AM.  Sleep onset time was 19.9 minutes and the sleep efficiency was 82.4%%. The total sleep time was 302 minutes.  Stage REM latency was 69.0 minutes.  The patient spent 8.9%% of the night in stage N1 sleep, 71.0%% in stage N2 sleep, 1.7%% in stage N3  and 18.4% in REM.  Alpha intrusion was absent.  Supine sleep was 41.39%.  RESPIRATORY PARAMETERS The overall apnea/hypopnea index (AHI) was 3.8 per hour. The respiratory disturbance index (RDI) was16.5/h. There were 3 total apneas, including 3 obstructive, 0 central and 0 mixed apneas. There were 16 hypopneas and 64 RERAs.  The AHI during Stage REM sleep was 8.6 per hour.  AHI while supine was 5.8 per hour.  The mean oxygen saturation was 94.5%. The minimum SpO2 during sleep was 83.0%.  Moderate snoring was noted during this study.  CARDIAC DATA The 2 lead EKG demonstrated sinus rhythm. The mean heart rate was 85.5 beats per minute. Other EKG findings include: None.  LEG MOVEMENT DATA The total PLMS were 0 with a resulting PLMS index of 0.0. Associated arousal with leg movement index was 0.0 .  IMPRESSIONS - Increased Upper Airway Resistance Syndrome (UARS) without definitive sleep apnea overall (AHI 3.8/h; but RDI 16.5/h) with mild sleep apnea with supine sleep (AHI 5.8/h) and during REM sleep (AHI 8.6/h). - No significant central sleep apnea occurred during this study (CAI = 0.0/h). - Moderate oxygen desaturation to a nadir of 83.0%. - The patient snored with moderate snoring volume. - Reduced latency to REM sleep. - No cardiac abnormalities were noted during this study. - Clinically significant periodic limb movements did not occur during sleep. No significant associated arousals.  DIAGNOSIS - Sleep Apnea, unspecified G47.30 - UARS - Nocturnal Hypoxemia (327.26 [G47.36 ICD-10]) - Moderate snoring - Excessive Daytime Sleepiness  RECOMMENDATIONS - At present patient may not meet criteria for CPAP therapy. If patient is symptomatic consider initial alternatives to CPAP and snoring.   - Effort should be made to optimize nasal and oropharyngeal patency. - If patient  continues to have significant daytime sleepiness (ESS 15), consider re-evaluation with a following day multiple  latency sleep test (MLST) to evaluate for narcolepsy or idiopathic hypersomnolence.  - Avoid alcohol, sedatives and other CNS depressants that may worsen sleep apnea and disrupt normal sleep architecture. - Sleep hygiene should be reviewed to assess factors that may improve sleep quality. - Weight management (BMI54) and regular exercise should be initiated.  [Electronically signed] 11/01/2019 02:53 PM  Shelva Majestic MD, [facility], ABSM Diplomate, American Board of Sleep Medicine   NPI: [id number] [place] PH: [phone]   FX: [phone] [place]

## 2019-11-03 ENCOUNTER — Other Ambulatory Visit: Payer: Self-pay

## 2019-11-10 ENCOUNTER — Telehealth: Payer: Self-pay | Admitting: *Deleted

## 2019-11-10 NOTE — Telephone Encounter (Signed)
Patient notified of sleep study results and recommendations. She wants to think about pursuing any further testing. If her symptoms persist, and she decides to do so then she will call us back to notify.

## 2019-11-11 ENCOUNTER — Encounter: Payer: Self-pay | Admitting: Family Medicine

## 2019-11-11 ENCOUNTER — Other Ambulatory Visit: Payer: Self-pay

## 2019-11-11 ENCOUNTER — Ambulatory Visit (INDEPENDENT_AMBULATORY_CARE_PROVIDER_SITE_OTHER): Payer: Medicaid Other | Admitting: Family Medicine

## 2019-11-11 VITALS — BP 150/100 | HR 89 | Wt 296.0 lb

## 2019-11-11 DIAGNOSIS — Z3200 Encounter for pregnancy test, result unknown: Secondary | ICD-10-CM | POA: Diagnosis not present

## 2019-11-11 DIAGNOSIS — Z3490 Encounter for supervision of normal pregnancy, unspecified, unspecified trimester: Secondary | ICD-10-CM

## 2019-11-11 DIAGNOSIS — E119 Type 2 diabetes mellitus without complications: Secondary | ICD-10-CM

## 2019-11-11 LAB — POCT URINE PREGNANCY: Preg Test, Ur: POSITIVE — AB

## 2019-11-11 LAB — POCT GLYCOSYLATED HEMOGLOBIN (HGB A1C): HbA1c, POC (controlled diabetic range): 8.7 % — AB (ref 0.0–7.0)

## 2019-11-11 MED ORDER — VALACYCLOVIR HCL 500 MG PO TABS: 500.0000 mg | ORAL_TABLET | ORAL | Status: DC | PRN

## 2019-11-11 MED ORDER — LABETALOL HCL 200 MG PO TABS: 200.0000 mg | ORAL_TABLET | Freq: Three times a day (TID) | ORAL | 1 refills | Status: DC

## 2019-11-11 MED ORDER — NIFEDIPINE ER OSMOTIC RELEASE 60 MG PO TB24: 60.0000 mg | ORAL_TABLET | Freq: Every day | ORAL | 3 refills | Status: DC

## 2019-11-11 MED ORDER — INSULIN GLARGINE 100 UNIT/ML ~~LOC~~ SOLN: 20.0000 [IU] | SUBCUTANEOUS | 11 refills | Status: DC

## 2019-11-11 NOTE — Progress Notes (Signed)
   CHIEF COMPLAINT / HPI:  Positive pregnancy test at home: Patient reports that she had a positive pregnancy test on Saturday and repeated it and it was positive on Monday as well.  She is here today to confirm. Patient reports that this pregnancy was not planned.  Patient reports that her last period was October 06, 2019 and only lasted 2 days.  States that previously she has had regular periods but they normally last 4 days. Patient denies any vaginal bleeding, discharge, abdominal pain, nausea, vomiting.  Patient is concerned given her history about being pregnant but states that she does not believe an abortion and will keep the baby.  Patient has 1 other child who was born via C-section 09/09/2014 at [redacted]w[redacted]d. She has a history of HTN, cardiomyopathy, T2DM, HLD, obesity, CAD. She is not currently using any contraception. She has a past medical history of MI with stent placement in 11/2017. Most recent perfusion scan shows EF of 56% and concerns for ischemia.  She follows closely with cardiology.  PERTINENT  PMH / PSH: HTN, cardiomyopathy, T2DM, HLD, obesity, CAD  OBJECTIVE: BP (!) 150/100   Pulse 89   Wt 296 lb (134.3 kg)   LMP 10/05/2019   SpO2 99%   BMI 54.14 kg/m   General: NAD, pleasant Neck: Supple, no LAD Cardiovascular: RRR, no m/r/g, no LE edema Respiratory: normal work of breathing Neuro: CN II-XII grossly intact Psych: AOx3, appropriate affect  ASSESSMENT / PLAN:  Pregnant Patient with positive pregnancy test today in clinic.  Patient is high risk.  Not interested in terminating pregnancy.  Consulted with on-call OB/GYN in order to determine medications that would be safe for patient given her cardiac history as well as poorly controlled hypertension and poorly controlled T2DM. -Patient to discontinue atorvastatin, glipizide, lisinopril, hydrochlorothiazide, Plavix.  She will stop metoprolol and initiate labetalol as below. -Patient to start Procardia 60 mg daily and  labetalol 200 mg 3 times daily.  -Patient to follow-up in clinic on 3/12 with PCP for blood pressure check.  If blood pressure remains poorly controlled with above plan then may need to reinitiate hydrochlorothiazide for better control. -Will need to collect urine protein creatinine ratio at follow-up on 3/12.  -Follow-up with high risk pregnancy clinic and patient given phone number to call to schedule appointment. -Patient encouraged to follow-up with her cardiologist as soon as possible to discuss her now being pregnant and need for change in medication -Patient counseled on need to obtain daily fasting CBGs as well as keeping a diary of her 1 hour postprandial CBGs-she reports that she has meter and strips at home in order to have this done.  She will increase Lantus from 16 units to 20 units given that she is also discontinuing glipizide.  She will continue with her current NovoLog sliding scale until she can follow-up with OB/GYN. -Patient to start taking prenatal vitamin -Patient to have dating ultrasound given that she had a regular period on 10/06/2019, scheduled 11/17/2019-patient to continue aspirin until dating ultrasound and may need to stop and initiate after 12 weeks but will discuss with OB/GYN. -Obtained initial OB labs, CMP, CBC, TSH     [place] Michai Dieppa, DO PGY-3, [place]

## 2019-11-11 NOTE — Patient Instructions (Signed)
Thank you for coming to see me today. It was a pleasure!   It is very important that you schedule a follow-up with your cardiologist as soon as possible and inform him of your current pregnancy.  The following medications are contraindicated in pregnancy and we recommend that you do not take at this time:  Stop taking your atorvastatin  Stop taking your glipizide  Stop taking your Plavix  Stop taking lisinopril  Stop taking metoprolol  The following medications will be to replace some of the above:  Please start taking Procardia 60 mg daily  Please start taking labetalol 200 mg 3 times per day  Please start taking a prenatal vitamin   Please increase your Lantus from 16 units to 20 units, daily.  Please call to schedule a follow-up appointment with OB/GYN at their [facility] clinic.  The phone number is [phone].  Please follow-up with our clinic on Friday for a blood pressure and urine check.  We will be able to follow-up on your labs at that time.  If you have any questions or concerns, please do not hesitate to call the office at [phone].  Please start checking your fasting blood glucose every day.  It will also be important for you to keep a diary of your blood glucoses.  During pregnancy they recommend that you also check your blood pressure 1 hour after every large meal.  Please start doing this so that you may have this reference for the OB/GYN.  Take Care,   [place] Kyara Boxer, DO

## 2019-11-13 ENCOUNTER — Ambulatory Visit (INDEPENDENT_AMBULATORY_CARE_PROVIDER_SITE_OTHER): Payer: Medicaid Other | Admitting: Internal Medicine

## 2019-11-13 ENCOUNTER — Encounter: Payer: Self-pay | Admitting: Family Medicine

## 2019-11-13 ENCOUNTER — Encounter: Payer: Self-pay | Admitting: Internal Medicine

## 2019-11-13 ENCOUNTER — Ambulatory Visit (INDEPENDENT_AMBULATORY_CARE_PROVIDER_SITE_OTHER): Payer: Medicaid Other | Admitting: Family Medicine

## 2019-11-13 ENCOUNTER — Other Ambulatory Visit: Payer: Self-pay

## 2019-11-13 VITALS — BP 135/90 | HR 100 | Temp 98.1°F | Ht 62.0 in | Wt 296.0 lb

## 2019-11-13 VITALS — BP 135/82 | HR 102 | Wt 297.0 lb

## 2019-11-13 DIAGNOSIS — Z3490 Encounter for supervision of normal pregnancy, unspecified, unspecified trimester: Secondary | ICD-10-CM

## 2019-11-13 DIAGNOSIS — E119 Type 2 diabetes mellitus without complications: Secondary | ICD-10-CM

## 2019-11-13 DIAGNOSIS — I249 Acute ischemic heart disease, unspecified: Secondary | ICD-10-CM

## 2019-11-13 DIAGNOSIS — I2510 Atherosclerotic heart disease of native coronary artery without angina pectoris: Secondary | ICD-10-CM | POA: Diagnosis not present

## 2019-11-13 DIAGNOSIS — Z794 Long term (current) use of insulin: Secondary | ICD-10-CM | POA: Diagnosis not present

## 2019-11-13 DIAGNOSIS — I10 Essential (primary) hypertension: Secondary | ICD-10-CM | POA: Diagnosis not present

## 2019-11-13 LAB — HGB FRACTIONATION CASCADE
Hgb A2: 2.3 % (ref 1.8–3.2)
Hgb A: 97.7 % (ref 96.4–98.8)
Hgb F: 0 % (ref 0.0–2.0)
Hgb S: 0 %

## 2019-11-13 MED ORDER — DOXYLAMINE-PYRIDOXINE 10-10 MG PO TBEC: 20.0000 mg | DELAYED_RELEASE_TABLET | Freq: Three times a day (TID) | ORAL | 0 refills | Status: DC | PRN

## 2019-11-13 NOTE — Assessment & Plan Note (Addendum)
Patient with positive pregnancy test today in clinic.  Patient is high risk.  Not interested in terminating pregnancy.  Consulted with on-call OB/GYN in order to determine medications that would be safe for patient given her cardiac history as well as poorly controlled hypertension and poorly controlled T2DM. -Patient to discontinue atorvastatin, glipizide, lisinopril, hydrochlorothiazide, Plavix.  She will stop metoprolol and initiate labetalol as below. -Patient to start Procardia 60 mg daily and labetalol 200 mg 3 times daily.  -Patient to follow-up in clinic on 3/12 with PCP for blood pressure check.  If blood pressure remains poorly controlled with above plan then may need to reinitiate hydrochlorothiazide for better control. -Will need to collect urine protein creatinine ratio at follow-up on 3/12.  -Follow-up with high risk pregnancy clinic and patient given phone number to call to schedule appointment. -Patient encouraged to follow-up with her cardiologist as soon as possible to discuss her now being pregnant and need for change in medication -Patient counseled on need to obtain daily fasting CBGs as well as keeping a diary of her 1 hour postprandial CBGs-she reports that she has meter and strips at home in order to have this done.  She will increase Lantus from 16 units to 20 units given that she is also discontinuing glipizide.  She will continue with her current NovoLog sliding scale until she can follow-up with OB/GYN. -Patient to start taking prenatal vitamin -Patient to have dating ultrasound given that she had a regular period on 10/06/2019, scheduled 11/17/2019-patient to continue aspirin until dating ultrasound and may need to stop and initiate after 12 weeks but will discuss with OB/GYN. -Obtained initial OB labs, CMP, CBC, TSH

## 2019-11-13 NOTE — Progress Notes (Signed)
ur

## 2019-11-13 NOTE — Patient Instructions (Signed)
It was great seeing you again today!  I am glad that we have been able to coordinate your care and get you into see obstetrics and gynecology on the 17th.  I am glad that you are he had an ultrasound scheduled.  Please keep this appointment on the 16th.  Always stop taking the Zofran for nausea and replace that with a new medication called diclegis.  I have sent this into your pharmacy.  We will get a get a lab called a protein/creatinine ratio of your urine to see if your kidney function is doing what is supposed to do.

## 2019-11-13 NOTE — Patient Instructions (Signed)
Medication Instructions:  STOP plavix OK to take aspirin START hydrochlorothiazide 12.5mg  daily  Continue other current medications   *If you need a refill on your cardiac medications before your next appointment, please call your pharmacy*  Follow-Up: At [facility], you and your health needs are our priority.  As part of our continuing mission to provide you with exceptional heart care, we have created designated Provider Care Teams.  These Care Teams include your primary Cardiologist (physician) and Advanced Practice Providers (APPs -  Physician Assistants and Nurse Practitioners) who all work together to provide you with the care you need, when you need it.  We recommend signing up for the patient portal called "MyChart".  Sign up information is provided on this After Visit Summary.  MyChart is used to connect with patients for Virtual Visits (Telemedicine).  Patients are able to view lab/test results, encounter notes, upcoming appointments, etc.  Non-urgent messages can be sent to your provider as well.   To learn more about what you can do with MyChart, go to [email or web address].    Your next appointment:   6 month(s)  The format for your next appointment:   In Person  Provider:   You may see Pixie Casino, MD or one of the following Advanced Practice Providers on your designated Care Team:    Almyra Deforest, PA-C  Fabian Sharp, PA-C or   Roby Lofts, [place]    Other Instructions
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
## 2019-11-14 ENCOUNTER — Encounter: Payer: Self-pay | Admitting: Internal Medicine

## 2019-11-16 ENCOUNTER — Encounter: Payer: Self-pay | Admitting: Family Medicine

## 2019-11-16 ENCOUNTER — Other Ambulatory Visit: Payer: Self-pay | Admitting: *Deleted

## 2019-11-16 DIAGNOSIS — O24119 Pre-existing diabetes mellitus, type 2, in pregnancy, unspecified trimester: Secondary | ICD-10-CM

## 2019-11-16 DIAGNOSIS — Z8619 Personal history of other infectious and parasitic diseases: Secondary | ICD-10-CM | POA: Insufficient documentation

## 2019-11-16 LAB — COMPREHENSIVE METABOLIC PANEL
ALT: 13 IU/L (ref 0–32)
AST: 12 IU/L (ref 0–40)
Albumin/Globulin Ratio: 1.1 — ABNORMAL LOW (ref 1.2–2.2)
Albumin: 3.3 g/dL — ABNORMAL LOW (ref 3.8–4.8)
Alkaline Phosphatase: 84 IU/L (ref 39–117)
BUN/Creatinine Ratio: 16 (ref 9–23)
BUN: 14 mg/dL (ref 6–20)
Bilirubin Total: <0.2 mg/dL (ref 0.0–1.2)
CO2: 23 mmol/L (ref 20–29)
Calcium: 8.8 mg/dL (ref 8.7–10.2)
Chloride: 103 mmol/L (ref 96–106)
Creatinine, Ser: 0.87 mg/dL (ref 0.57–1.00)
GFR calc Af Amer: 102 mL/min/{1.73_m2} (ref >59)
GFR calc non Af Amer: 88 mL/min/{1.73_m2} (ref >59)
Globulin, Total: 2.9 g/dL (ref 1.5–4.5)
Glucose: 150 mg/dL — ABNORMAL HIGH (ref 65–99)
Potassium: 3.5 mmol/L (ref 3.5–5.2)
Sodium: 139 mmol/L (ref 134–144)
Total Protein: 6.2 g/dL (ref 6.0–8.5)

## 2019-11-16 LAB — OBSTETRIC PANEL, INCLUDING HIV
Antibody Screen: NEGATIVE
HIV Screen 4th Generation wRfx: NONREACTIVE
Hepatitis B Surface Ag: NEGATIVE
RPR Ser Ql: NONREACTIVE
Rh Factor: POSITIVE
Rubella Antibodies, IGG: <0.9 index — ABNORMAL LOW (ref >0.99)

## 2019-11-16 LAB — TSH: TSH: 3.88 u[IU]/mL (ref 0.450–4.500)

## 2019-11-16 NOTE — Assessment & Plan Note (Addendum)
Patient with history of preeclampsia with previous pregnancy.  Protein creatinine ratio drawn.  Okay to restart her hydrochlorothiazide per cardiology.  Patient has ultrasound on 3/16 and formal consult with OB/GYN scheduled for 3/17.  I recommend stopping the Zofran to the patient.  I did send in a prescription for Diclegis to help with her nausea.

## 2019-11-16 NOTE — Assessment & Plan Note (Signed)
Patient okay to stop the Plavix per cardiology secondary to pregnancy.  Remain on aspirin.  Encourage routine follow-up with cardiology.

## 2019-11-16 NOTE — Progress Notes (Signed)
   CHIEF COMPLAINT / HPI: 34 year old female who presents for prenatal follow-up.  In short the patient was seen on 3/10 for positive pregnancy test at home.  Last period was October 06, 2019.  Patient has a very complex medical history involving NSTEMI requiring stent placement.  She was on lisinopril, glipizide, hydrochlorothiazide, Plavix, aspirin.  She is also on metoprolol.  Toprol was stopped and she was started on Procardia, labetalol.  Patient actually described as seeing cardiology who recommended restarting her hydrochlorothiazide which was stopped briefly for pregnancy concerns.  Patient was referred to obstetrics and gynecology.  She has a prenatal ultrasound scheduled for 11/17/2019.  Patient was brought back for follow-up to draw a protein/creatinine ratio as well as to ensure that all these follow-up items had been arranged.  Of note the patient has a history of preeclampsia from previous pregnancy.  Patient states that she is doing really well.  She has been having some nausea which is typical of her first trimester pregnancies.  She states that she has some Zofran at home and has been taking that.  PERTINENT  PMH / PSH: CAD, NSTEMI, type 2 diabetes, high cholesterol   OBJECTIVE: BP 135/82   Pulse (!) 102   Wt 297 lb (134.7 kg)   LMP 10/05/2019   SpO2 100%   BMI 54.32 kg/m   Gen: 34 year old American female, no acute distress, very pleasant CV: Very mild tachycardia, no M/R/G regular rhythm Resp: CTAB, no wheezes, non-labored Neuro: Alert and oriented, Speech clear, No gross deficits   ASSESSMENT / PLAN:  Pregnant Patient with history of preeclampsia with previous pregnancy.  Protein creatinine ratio drawn.  Okay to restart her hydrochlorothiazide per cardiology.  Patient has ultrasound on 3/16 and formal consult with OB/GYN scheduled for 3/17.  I recommend stopping the Zofran to the patient.  I did send in a prescription for Diclegis to help with her nausea.  ACS (acute  coronary syndrome) [facility]) Patient okay to stop the Plavix per cardiology secondary to pregnancy.  Remain on aspirin.  Encourage routine follow-up with cardiology.  Type 2 diabetes mellitus without complication (HCC) 123456 8.7 from 10.7 at last check approximately 1 week prior to this visit.  Congratulated patient on her improvement.  Switch to Lantus 20 units, aspart sliding scale.  Would likely benefit from starting glyburide, but will defer this to OB/GYN.  Can likely restart Metformin and GLP-1/SGLT2 when no longer pregnant.  Hypertension Currently well controlled on hydralazine 25 mg twice daily, hydrochlorothiazide 12.5 daily, labetalol 200 mg 3 times daily, Procardia 60 mg nightly.  Also has Imdur 60 mg daily on board as well.  Did not receive any hydrochlorothiazide this a.m., BP still well controlled at 135/80.     Guadalupe Dawn, MD [place]

## 2019-11-16 NOTE — Assessment & Plan Note (Addendum)
A1c 8.7 from 10.7 at last check approximately 1 week prior to this visit.  Congratulated patient on her improvement.  Switch to Lantus 20 units, aspart sliding scale.  Would likely benefit from starting glyburide, but will defer this to OB/GYN.  Can likely restart Metformin and GLP-1/SGLT2 when no longer pregnant.

## 2019-11-16 NOTE — Assessment & Plan Note (Signed)
Currently well controlled on hydralazine 25 mg twice daily, hydrochlorothiazide 12.5 daily, labetalol 200 mg 3 times daily, Procardia 60 mg nightly.  Also has Imdur 60 mg daily on board as well.  Did not receive any hydrochlorothiazide this a.m., BP still well controlled at 135/80.

## 2019-11-17 ENCOUNTER — Other Ambulatory Visit: Payer: Self-pay | Admitting: Family Medicine

## 2019-11-17 ENCOUNTER — Ambulatory Visit (HOSPITAL_COMMUNITY)
Admission: RE | Admit: 2019-11-17 | Discharge: 2019-11-17 | Disposition: A | Discharge status: Home / Self Care | Payer: Medicaid Other | Source: Ambulatory Visit | Attending: Family Medicine | Admitting: Family Medicine

## 2019-11-17 ENCOUNTER — Other Ambulatory Visit: Payer: Self-pay

## 2019-11-17 DIAGNOSIS — Z3A01 Less than 8 weeks gestation of pregnancy: Secondary | ICD-10-CM | POA: Diagnosis not present

## 2019-11-17 DIAGNOSIS — Z3200 Encounter for pregnancy test, result unknown: Secondary | ICD-10-CM

## 2019-11-17 DIAGNOSIS — O3680X0 Pregnancy with inconclusive fetal viability, not applicable or unspecified: Secondary | ICD-10-CM | POA: Diagnosis not present

## 2019-11-17 LAB — MICROALBUMIN / CREATININE URINE RATIO
Creatinine, Urine: 295.2 mg/dL
Microalb/Creat Ratio: 507 mg/g creat — ABNORMAL HIGH (ref 0–29)
Microalbumin, Urine: 1496.4 ug/mL

## 2019-11-18 ENCOUNTER — Encounter: Payer: Self-pay | Admitting: Obstetrics and Gynecology

## 2019-11-18 ENCOUNTER — Ambulatory Visit (INDEPENDENT_AMBULATORY_CARE_PROVIDER_SITE_OTHER): Payer: Medicaid Other | Admitting: Obstetrics and Gynecology

## 2019-11-18 VITALS — BP 138/99 | HR 75 | Wt 297.2 lb

## 2019-11-18 DIAGNOSIS — O99411 Diseases of the circulatory system complicating pregnancy, first trimester: Secondary | ICD-10-CM | POA: Diagnosis not present

## 2019-11-18 DIAGNOSIS — Z8619 Personal history of other infectious and parasitic diseases: Secondary | ICD-10-CM

## 2019-11-18 DIAGNOSIS — Z955 Presence of coronary angioplasty implant and graft: Secondary | ICD-10-CM

## 2019-11-18 DIAGNOSIS — F329 Major depressive disorder, single episode, unspecified: Secondary | ICD-10-CM

## 2019-11-18 DIAGNOSIS — I429 Cardiomyopathy, unspecified: Secondary | ICD-10-CM

## 2019-11-18 DIAGNOSIS — Z3A01 Less than 8 weeks gestation of pregnancy: Secondary | ICD-10-CM | POA: Diagnosis not present

## 2019-11-18 DIAGNOSIS — O0990 Supervision of high risk pregnancy, unspecified, unspecified trimester: Secondary | ICD-10-CM

## 2019-11-18 DIAGNOSIS — O10011 Pre-existing essential hypertension complicating pregnancy, first trimester: Secondary | ICD-10-CM

## 2019-11-18 DIAGNOSIS — I10 Essential (primary) hypertension: Secondary | ICD-10-CM | POA: Diagnosis not present

## 2019-11-18 DIAGNOSIS — O99341 Other mental disorders complicating pregnancy, first trimester: Secondary | ICD-10-CM | POA: Diagnosis not present

## 2019-11-18 DIAGNOSIS — I2510 Atherosclerotic heart disease of native coronary artery without angina pectoris: Secondary | ICD-10-CM | POA: Diagnosis not present

## 2019-11-18 DIAGNOSIS — F32A Depression, unspecified: Secondary | ICD-10-CM

## 2019-11-18 HISTORY — DX: Supervision of high risk pregnancy, unspecified, unspecified trimester: O09.90

## 2019-11-18 MED ORDER — BLOOD PRESSURE MONITORING DEVI: 1.0000 | 0 refills | Status: DC

## 2019-11-18 NOTE — Progress Notes (Signed)
Medicaid Home Form Completed-11/18/19

## 2019-11-18 NOTE — Patient Instructions (Signed)
First Trimester of Pregnancy The first trimester of pregnancy is from week 1 until the end of week 13 (months 1 through 3). A week after a sperm fertilizes an egg, the egg will implant on the wall of the uterus. This embryo will begin to develop into a baby. Genes from you and your partner will form the baby. The female genes will determine whether the baby will be a boy or a girl. At 6-8 weeks, the eyes and face will be formed, and the heartbeat can be seen on ultrasound. At the end of 12 weeks, all the baby's organs will be formed. Now that you are pregnant, you will want to do everything you can to have a healthy baby. Two of the most important things are to get good prenatal care and to follow your health care provider's instructions. Prenatal care is all the medical care you receive before the baby's birth. This care will help prevent, find, and treat any problems during the pregnancy and childbirth. Body changes during your first trimester Your body goes through many changes during pregnancy. The changes vary from woman to woman.  You may gain or lose a couple of pounds at first.  You may feel sick to your stomach (nauseous) and you may throw up (vomit). If the vomiting is uncontrollable, call your health care provider.  You may tire easily.  You may develop headaches that can be relieved by medicines. All medicines should be approved by your health care provider.  You may urinate more often. Painful urination may mean you have a bladder infection.  You may develop heartburn as a result of your pregnancy.  You may develop constipation because certain hormones are causing the muscles that push stool through your intestines to slow down.  You may develop hemorrhoids or swollen veins (varicose veins).  Your breasts may begin to grow larger and become tender. Your nipples may stick out more, and the tissue that surrounds them (areola) may become darker.  Your gums may bleed and may be  sensitive to brushing and flossing.  Dark spots or blotches (chloasma, mask of pregnancy) may develop on your face. This will likely fade after the baby is born.  Your menstrual periods will stop.  You may have a loss of appetite.  You may develop cravings for certain kinds of food.  You may have changes in your emotions from day to day, such as being excited to be pregnant or being concerned that something may go wrong with the pregnancy and baby.  You may have more vivid and strange dreams.  You may have changes in your hair. These can include thickening of your hair, rapid growth, and changes in texture. Some women also have hair loss during or after pregnancy, or hair that feels dry or thin. Your hair will most likely return to normal after your baby is born. What to expect at prenatal visits During a routine prenatal visit:  You will be weighed to make sure you and the baby are growing normally.  Your blood pressure will be taken.  Your abdomen will be measured to track your baby's growth.  The fetal heartbeat will be listened to between weeks 10 and 14 of your pregnancy.  Test results from any previous visits will be discussed. Your health care provider may ask you:  How you are feeling.  If you are feeling the baby move.  If you have had any abnormal symptoms, such as leaking fluid, bleeding, severe headaches, or abdominal   cramping.  If you are using any tobacco products, including cigarettes, chewing tobacco, and electronic cigarettes.  If you have any questions. Other tests that may be performed during your first trimester include:  Blood tests to find your blood type and to check for the presence of any previous infections. The tests will also be used to check for low iron levels (anemia) and protein on red blood cells (Rh antibodies). Depending on your risk factors, or if you previously had diabetes during pregnancy, you may have tests to check for high blood sugar  that affects pregnant women (gestational diabetes).  Urine tests to check for infections, diabetes, or protein in the urine.  An ultrasound to confirm the proper growth and development of the baby.  Fetal screens for spinal cord problems (spina bifida) and Down syndrome.  HIV (human immunodeficiency virus) testing. Routine prenatal testing includes screening for HIV, unless you choose not to have this test.  You may need other tests to make sure you and the baby are doing well. Follow these instructions at home: Medicines  Follow your health care provider's instructions regarding medicine use. Specific medicines may be either safe or unsafe to take during pregnancy.  Take a prenatal vitamin that contains at least 600 micrograms (mcg) of folic acid.  If you develop constipation, try taking a stool softener if your health care provider approves. Eating and drinking   Eat a balanced diet that includes fresh fruits and vegetables, whole grains, good sources of protein such as meat, eggs, or tofu, and low-fat dairy. Your health care provider will help you determine the amount of weight gain that is right for you.  Avoid raw meat and uncooked cheese. These carry germs that can cause birth defects in the baby.  Eating four or five small meals rather than three large meals a day may help relieve nausea and vomiting. If you start to feel nauseous, eating a few soda crackers can be helpful. Drinking liquids between meals, instead of during meals, also seems to help ease nausea and vomiting.  Limit foods that are high in fat and processed sugars, such as fried and sweet foods.  To prevent constipation: ? Eat foods that are high in fiber, such as fresh fruits and vegetables, whole grains, and beans. ? Drink enough fluid to keep your urine clear or pale yellow. Activity  Exercise only as directed by your health care provider. Most women can continue their usual exercise routine during  pregnancy. Try to exercise for 30 minutes at least 5 days a week. Exercising will help you: ? Control your weight. ? Stay in shape. ? Be prepared for labor and delivery.  Experiencing pain or cramping in the lower abdomen or lower back is a good sign that you should stop exercising. Check with your health care provider before continuing with normal exercises.  Try to avoid standing for long periods of time. Move your legs often if you must stand in one place for a long time.  Avoid heavy lifting.  Wear low-heeled shoes and practice good posture.  You may continue to have sex unless your health care provider tells you not to. Relieving pain and discomfort  Wear a good support bra to relieve breast tenderness.  Take warm sitz baths to soothe any pain or discomfort caused by hemorrhoids. Use hemorrhoid cream if your health care provider approves.  Rest with your legs elevated if you have leg cramps or low back pain.  If you develop varicose veins in   your legs, wear support hose. Elevate your feet for 15 minutes, 3-4 times a day. Limit salt in your diet. Prenatal care  Schedule your prenatal visits by the twelfth week of pregnancy. They are usually scheduled monthly at first, then more often in the last 2 months before delivery.  Write down your questions. Take them to your prenatal visits.  Keep all your prenatal visits as told by your health care provider. This is important. Safety  Wear your seat belt at all times when driving.  Make a list of emergency phone numbers, including numbers for family, friends, the hospital, and police and fire departments. General instructions  Ask your health care provider for a referral to a local prenatal education class. Begin classes no later than the beginning of month 6 of your pregnancy.  Ask for help if you have counseling or nutritional needs during pregnancy. Your health care provider can offer advice or refer you to specialists for help  with various needs.  Do not use hot tubs, steam rooms, or saunas.  Do not douche or use tampons or scented sanitary pads.  Do not cross your legs for long periods of time.  Avoid cat litter boxes and soil used by cats. These carry germs that can cause birth defects in the baby and possibly loss of the fetus by miscarriage or stillbirth.  Avoid all smoking, herbs, alcohol, and medicines not prescribed by your health care provider. Chemicals in these products affect the formation and growth of the baby.  Do not use any products that contain nicotine or tobacco, such as cigarettes and e-cigarettes. If you need help quitting, ask your health care provider. You may receive counseling support and other resources to help you quit.  Schedule a dentist appointment. At home, brush your teeth with a soft toothbrush and be gentle when you floss. Contact a health care provider if:  You have dizziness.  You have mild pelvic cramps, pelvic pressure, or nagging pain in the abdominal area.  You have persistent nausea, vomiting, or diarrhea.  You have a bad smelling vaginal discharge.  You have pain when you urinate.  You notice increased swelling in your face, hands, legs, or ankles.  You are exposed to fifth disease or chickenpox.  You are exposed to German measles (rubella) and have never had it. Get help right away if:  You have a fever.  You are leaking fluid from your vagina.  You have spotting or bleeding from your vagina.  You have severe abdominal cramping or pain.  You have rapid weight gain or loss.  You vomit blood or material that looks like coffee grounds.  You develop a severe headache.  You have shortness of breath.  You have any kind of trauma, such as from a fall or a car accident. Summary  The first trimester of pregnancy is from week 1 until the end of week 13 (months 1 through 3).  Your body goes through many changes during pregnancy. The changes vary from  woman to woman.  You will have routine prenatal visits. During those visits, your health care provider will examine you, discuss any test results you may have, and talk with you about how you are feeling. This information is not intended to replace advice given to you by your health care provider. Make sure you discuss any questions you have with your health care provider. Document Revised: 08/02/2017 Document Reviewed: 08/01/2016 Elsevier Patient Education  2020 [employer]  

## 2019-11-18 NOTE — Progress Notes (Signed)
Subjective:  Lauren Delgado is a 34 y.o. G2P1001 at [redacted]w[redacted]d being seen today for ongoing prenatal care.  Started [facility] with [facility]. Transferred due to medical problems as listed. U/S yesterday 5w 5d, + cardiac activity. Prenatal labs completed at [facility]. Medications adjusted for pregnancy. Has seen cardiology, stable for their standpoint and has follow up in 6 months.H/O c section 5 yrs ago d/t NRFHT's. She is currently monitored for the following issues for this high-risk pregnancy and has Polycystic ovaries; Morbid obesity ([place]); History of cesarean section; Type 2 diabetes mellitus without complication ([place]); NSTEMI (non-ST elevated myocardial infarction) [facility]); ACS (acute coronary syndrome) ([place]); Hyperlipidemia associated with type 2 diabetes mellitus ([place]); Abdominal obesity and metabolic syndrome; Cardiomyopathy ([place]); Status post coronary artery stent placement; Coronary artery disease; Hypertension; Lipoma of right lower extremity; Healthcare maintenance; Sleep apnea; Pregnant; History of herpes genitalis; and Supervision of high risk pregnancy, antepartum on their problem list.  Patient reports no complaints.  Contractions: Not present. Vag. Bleeding: None.  Movement: Absent. Denies leaking of fluid.   The following portions of the patient's history were reviewed and updated as appropriate: allergies, current medications, past family history, past medical history, past social history, past surgical history and problem list. Problem list updated.  Objective:   Vitals:   11/18/19 1505  BP: (!) 138/99  Pulse: 75  Weight: 297 lb 3.2 oz (134.8 kg)    Fetal Status:     Movement: Absent     General:  Alert, oriented and cooperative. Patient is in no acute distress.  Skin: Skin is warm and dry. No rash noted.   Cardiovascular: Normal heart rate noted  Respiratory: Normal respiratory effort, no problems with respiration noted  Abdomen: Soft, gravid, appropriate for gestational age. Pain/Pressure:  Absent     Pelvic:  Cervical exam deferred        Extremities: Normal range of motion.  Edema: None  Mental Status: Normal mood and affect. Normal behavior. Normal judgment and thought content.   Urinalysis:      Assessment and Plan:  Pregnancy: G2P1001 at [redacted]w[redacted]d  1. Supervision of high risk pregnancy, antepartum PNV labs reviewed. Urine today for P/Cr ratio Viability scan ordered in 2 weeks, office visit afterwards to review - [facility] AMB BABYSCRIPTS SCHEDULE OPTIMIZATION - Blood Pressure Monitoring DEVI; 1 Device by Does not apply route once a week.  Dispense: 1 Device; Refill: 0 - [place] [facility] OB TRANSVAGINAL; Future  2. Status post coronary artery stent placement Stable, no Sx Continue with current management Followed by cards Check maternal ECHO in third trimester.  3. Essential hypertension Stable BP monitoring reviewed with pt. Baby Rx ordered as well as BP cuff  4. History of herpes genitalis Suppression at 36 weeks  5. Coronary artery disease involving native heart without angina pectoris, unspecified vessel or lesion type See above  6. Cardiomyopathy, unspecified type ([place]) See above  7. Type DM On Lantus 20 units in AM and Novolog sliding scale with meals. CBG goals reviewed with pt. Importance of check CBG's qid discussed, pt currently checking 1-2 times a day Baby Rx ordered today Will continue with current regiment at this time as uncertain how to adjust with min CBG readings Will need fetal ECHO at 24 weeks, if viability confirmed    Preterm labor symptoms and general obstetric precautions including but not limited to vaginal bleeding, contractions, leaking of fluid and fetal movement were reviewed in detail with the patient. Please refer to After Visit Summary for other counseling  recommendations.  Return in about 2 weeks (around 12/02/2019) for OB visit, face to face, MD provider, after MFM U/S.   Chancy Milroy, MD

## 2019-11-18 NOTE — Progress Notes (Unsigned)
Pt needs to see Joyce Copa, she scored a 16 on her PHQ.

## 2019-11-18 NOTE — Addendum Note (Signed)
Addended by: Chancy Milroy on: 11/18/2019 03:44 PM   Modules accepted: Orders

## 2019-11-18 NOTE — Addendum Note (Signed)
Addended by: Bethanne Ginger on: 11/18/2019 04:49 PM   Modules accepted: Orders

## 2019-11-19 ENCOUNTER — Other Ambulatory Visit: Payer: Medicaid Other

## 2019-11-19 ENCOUNTER — Encounter: Payer: Self-pay | Admitting: *Deleted

## 2019-11-19 DIAGNOSIS — O0990 Supervision of high risk pregnancy, unspecified, unspecified trimester: Secondary | ICD-10-CM | POA: Diagnosis not present

## 2019-11-19 LAB — PROTEIN / CREATININE RATIO, URINE
Creatinine, Urine: 50.9 mg/dL
Protein, Ur: 136.8 mg/dL
Protein/Creat Ratio: 2688 mg/g creat — ABNORMAL HIGH (ref 0–200)

## 2019-11-20 ENCOUNTER — Telehealth: Payer: Self-pay | Admitting: *Deleted

## 2019-11-20 DIAGNOSIS — O1210 Gestational proteinuria, unspecified trimester: Secondary | ICD-10-CM | POA: Insufficient documentation

## 2019-11-20 NOTE — Telephone Encounter (Addendum)
-----   Message from Chancy Milroy, MD sent at 11/19/2019  5:44 PM EDT ----- Please have the pt collet a 24 hr for protein and crt clearance.  Thanks Legrand Como   3/19  1045  Called pt and left message on her personal VM stating that we will need to do some additional testing next week due to she has protein in her urine. This not an emergency. We will have her collect her urine for 24 hours and then return to the office with the specimen and to have blood drawn. She will receive additional information when she is here for the Diabetes education appt on 3/25. Future orders placed.

## 2019-11-24 ENCOUNTER — Other Ambulatory Visit: Payer: Self-pay

## 2019-11-24 ENCOUNTER — Emergency Department: Payer: Medicaid Other

## 2019-11-24 ENCOUNTER — Inpatient Hospital Stay
Admission: EM | Admit: 2019-11-24 | Discharge: 2019-11-26 | DRG: 831 | Disposition: A | Discharge status: Home / Self Care | Payer: Medicaid Other | Attending: Internal Medicine | Admitting: Internal Medicine

## 2019-11-24 ENCOUNTER — Inpatient Hospital Stay: Payer: Medicaid Other

## 2019-11-24 ENCOUNTER — Inpatient Hospital Stay (HOSPITAL_COMMUNITY)
Admit: 2019-11-24 | Discharge: 2019-11-24 | Disposition: A | Discharge status: Home / Self Care | Payer: Medicaid Other | Attending: Internal Medicine | Admitting: Internal Medicine

## 2019-11-24 DIAGNOSIS — I5043 Acute on chronic combined systolic (congestive) and diastolic (congestive) heart failure: Secondary | ICD-10-CM | POA: Diagnosis present

## 2019-11-24 DIAGNOSIS — Z8261 Family history of arthritis: Secondary | ICD-10-CM

## 2019-11-24 DIAGNOSIS — O99511 Diseases of the respiratory system complicating pregnancy, first trimester: Secondary | ICD-10-CM | POA: Diagnosis present

## 2019-11-24 DIAGNOSIS — Z8249 Family history of ischemic heart disease and other diseases of the circulatory system: Secondary | ICD-10-CM

## 2019-11-24 DIAGNOSIS — E039 Hypothyroidism, unspecified: Secondary | ICD-10-CM | POA: Diagnosis present

## 2019-11-24 DIAGNOSIS — I252 Old myocardial infarction: Secondary | ICD-10-CM

## 2019-11-24 DIAGNOSIS — O161 Unspecified maternal hypertension, first trimester: Secondary | ICD-10-CM | POA: Diagnosis present

## 2019-11-24 DIAGNOSIS — O99281 Endocrine, nutritional and metabolic diseases complicating pregnancy, first trimester: Secondary | ICD-10-CM | POA: Diagnosis present

## 2019-11-24 DIAGNOSIS — I5023 Acute on chronic systolic (congestive) heart failure: Secondary | ICD-10-CM | POA: Diagnosis not present

## 2019-11-24 DIAGNOSIS — Z8489 Family history of other specified conditions: Secondary | ICD-10-CM

## 2019-11-24 DIAGNOSIS — Z20822 Contact with and (suspected) exposure to covid-19: Secondary | ICD-10-CM | POA: Diagnosis present

## 2019-11-24 DIAGNOSIS — Z8619 Personal history of other infectious and parasitic diseases: Secondary | ICD-10-CM

## 2019-11-24 DIAGNOSIS — I509 Heart failure, unspecified: Secondary | ICD-10-CM | POA: Diagnosis not present

## 2019-11-24 DIAGNOSIS — I25118 Atherosclerotic heart disease of native coronary artery with other forms of angina pectoris: Secondary | ICD-10-CM | POA: Diagnosis not present

## 2019-11-24 DIAGNOSIS — I161 Hypertensive emergency: Secondary | ICD-10-CM | POA: Diagnosis present

## 2019-11-24 DIAGNOSIS — J810 Acute pulmonary edema: Secondary | ICD-10-CM | POA: Diagnosis not present

## 2019-11-24 DIAGNOSIS — I255 Ischemic cardiomyopathy: Secondary | ICD-10-CM | POA: Diagnosis present

## 2019-11-24 DIAGNOSIS — J9601 Acute respiratory failure with hypoxia: Secondary | ICD-10-CM | POA: Diagnosis not present

## 2019-11-24 DIAGNOSIS — E785 Hyperlipidemia, unspecified: Secondary | ICD-10-CM | POA: Diagnosis present

## 2019-11-24 DIAGNOSIS — Z955 Presence of coronary angioplasty implant and graft: Secondary | ICD-10-CM

## 2019-11-24 DIAGNOSIS — I2510 Atherosclerotic heart disease of native coronary artery without angina pectoris: Secondary | ICD-10-CM | POA: Diagnosis not present

## 2019-11-24 DIAGNOSIS — E1169 Type 2 diabetes mellitus with other specified complication: Secondary | ICD-10-CM | POA: Diagnosis present

## 2019-11-24 DIAGNOSIS — I169 Hypertensive crisis, unspecified: Secondary | ICD-10-CM | POA: Diagnosis present

## 2019-11-24 DIAGNOSIS — O99011 Anemia complicating pregnancy, first trimester: Secondary | ICD-10-CM | POA: Diagnosis present

## 2019-11-24 DIAGNOSIS — I110 Hypertensive heart disease with heart failure: Secondary | ICD-10-CM | POA: Diagnosis not present

## 2019-11-24 DIAGNOSIS — G4730 Sleep apnea, unspecified: Secondary | ICD-10-CM | POA: Diagnosis present

## 2019-11-24 DIAGNOSIS — O99211 Obesity complicating pregnancy, first trimester: Secondary | ICD-10-CM | POA: Diagnosis present

## 2019-11-24 DIAGNOSIS — I5033 Acute on chronic diastolic (congestive) heart failure: Secondary | ICD-10-CM | POA: Diagnosis not present

## 2019-11-24 DIAGNOSIS — Z7982 Long term (current) use of aspirin: Secondary | ICD-10-CM

## 2019-11-24 DIAGNOSIS — Z825 Family history of asthma and other chronic lower respiratory diseases: Secondary | ICD-10-CM

## 2019-11-24 DIAGNOSIS — I429 Cardiomyopathy, unspecified: Secondary | ICD-10-CM

## 2019-11-24 DIAGNOSIS — Z98891 History of uterine scar from previous surgery: Secondary | ICD-10-CM

## 2019-11-24 DIAGNOSIS — Z821 Family history of blindness and visual loss: Secondary | ICD-10-CM

## 2019-11-24 DIAGNOSIS — Z3A01 Less than 8 weeks gestation of pregnancy: Secondary | ICD-10-CM

## 2019-11-24 DIAGNOSIS — D649 Anemia, unspecified: Secondary | ICD-10-CM | POA: Diagnosis present

## 2019-11-24 DIAGNOSIS — I420 Dilated cardiomyopathy: Secondary | ICD-10-CM | POA: Diagnosis present

## 2019-11-24 DIAGNOSIS — E0865 Diabetes mellitus due to underlying condition with hyperglycemia: Secondary | ICD-10-CM | POA: Diagnosis not present

## 2019-11-24 DIAGNOSIS — O99411 Diseases of the circulatory system complicating pregnancy, first trimester: Secondary | ICD-10-CM | POA: Diagnosis present

## 2019-11-24 DIAGNOSIS — Z818 Family history of other mental and behavioral disorders: Secondary | ICD-10-CM

## 2019-11-24 DIAGNOSIS — O24111 Pre-existing diabetes mellitus, type 2, in pregnancy, first trimester: Secondary | ICD-10-CM | POA: Diagnosis present

## 2019-11-24 DIAGNOSIS — Z8632 Personal history of gestational diabetes: Secondary | ICD-10-CM

## 2019-11-24 DIAGNOSIS — Z822 Family history of deafness and hearing loss: Secondary | ICD-10-CM

## 2019-11-24 DIAGNOSIS — Z3490 Encounter for supervision of normal pregnancy, unspecified, unspecified trimester: Secondary | ICD-10-CM

## 2019-11-24 DIAGNOSIS — R0602 Shortness of breath: Secondary | ICD-10-CM | POA: Diagnosis not present

## 2019-11-24 DIAGNOSIS — R600 Localized edema: Secondary | ICD-10-CM

## 2019-11-24 DIAGNOSIS — Z794 Long term (current) use of insulin: Secondary | ICD-10-CM | POA: Diagnosis not present

## 2019-11-24 DIAGNOSIS — Z823 Family history of stroke: Secondary | ICD-10-CM

## 2019-11-24 DIAGNOSIS — E1165 Type 2 diabetes mellitus with hyperglycemia: Secondary | ICD-10-CM | POA: Diagnosis not present

## 2019-11-24 DIAGNOSIS — Z810 Family history of intellectual disabilities: Secondary | ICD-10-CM

## 2019-11-24 DIAGNOSIS — O09891 Supervision of other high risk pregnancies, first trimester: Secondary | ICD-10-CM

## 2019-11-24 DIAGNOSIS — Z833 Family history of diabetes mellitus: Secondary | ICD-10-CM

## 2019-11-24 DIAGNOSIS — Z79899 Other long term (current) drug therapy: Secondary | ICD-10-CM

## 2019-11-24 HISTORY — DX: Type 2 diabetes mellitus without complications: E11.9

## 2019-11-24 HISTORY — DX: Old myocardial infarction: I25.2

## 2019-11-24 HISTORY — DX: Heart failure, unspecified: I50.9

## 2019-11-24 HISTORY — DX: Morbid (severe) obesity due to excess calories: E66.01

## 2019-11-24 HISTORY — DX: Atherosclerotic heart disease of native coronary artery without angina pectoris: I25.10

## 2019-11-24 HISTORY — DX: Hyperlipidemia, unspecified: E78.5

## 2019-11-24 LAB — CBC WITH DIFFERENTIAL/PLATELET
Abs Immature Granulocytes: 0.06 10*3/uL (ref 0.00–0.07)
Basophils Absolute: 0 10*3/uL (ref 0.0–0.1)
Basophils Relative: 0 %
Eosinophils Absolute: 0 10*3/uL (ref 0.0–0.5)
Eosinophils Relative: 0 %
HCT: 31.9 % — ABNORMAL LOW (ref 36.0–46.0)
Hemoglobin: 10.3 g/dL — ABNORMAL LOW (ref 12.0–15.0)
Immature Granulocytes: 0 %
Lymphocytes Relative: 13 %
Lymphs Abs: 1.8 10*3/uL (ref 0.7–4.0)
MCH: 29.4 pg (ref 26.0–34.0)
MCHC: 32.3 g/dL (ref 30.0–36.0)
MCV: 91.1 fL (ref 80.0–100.0)
Monocytes Absolute: 0.5 10*3/uL (ref 0.1–1.0)
Monocytes Relative: 4 %
Neutro Abs: 11.7 10*3/uL — ABNORMAL HIGH (ref 1.7–7.7)
Neutrophils Relative %: 83 %
Platelets: 302 10*3/uL (ref 150–400)
RBC: 3.5 MIL/uL — ABNORMAL LOW (ref 3.87–5.11)
RDW: 13.1 % (ref 11.5–15.5)
WBC: 14.2 10*3/uL — ABNORMAL HIGH (ref 4.0–10.5)
nRBC: 0 % (ref 0.0–0.2)

## 2019-11-24 LAB — COMPREHENSIVE METABOLIC PANEL
ALT: 18 U/L (ref 0–44)
AST: 21 U/L (ref 15–41)
Albumin: 3.3 g/dL — ABNORMAL LOW (ref 3.5–5.0)
Alkaline Phosphatase: 65 U/L (ref 38–126)
Anion gap: 12 (ref 5–15)
BUN: 15 mg/dL (ref 6–20)
CO2: 19 mmol/L — ABNORMAL LOW (ref 22–32)
Calcium: 8.5 mg/dL — ABNORMAL LOW (ref 8.9–10.3)
Chloride: 104 mmol/L (ref 98–111)
Creatinine, Ser: 0.8 mg/dL (ref 0.44–1.00)
GFR calc Af Amer: >60 mL/min (ref >60)
GFR calc non Af Amer: >60 mL/min (ref >60)
Glucose, Bld: 289 mg/dL — ABNORMAL HIGH (ref 70–99)
Potassium: 3.7 mmol/L (ref 3.5–5.1)
Sodium: 135 mmol/L (ref 135–145)
Total Bilirubin: 0.9 mg/dL (ref 0.3–1.2)
Total Protein: 7.3 g/dL (ref 6.5–8.1)

## 2019-11-24 LAB — PROTIME-INR
INR: 1 (ref 0.8–1.2)
Prothrombin Time: 12.7 seconds (ref 11.4–15.2)

## 2019-11-24 LAB — ECHOCARDIOGRAM COMPLETE
Height: 62 in
Weight: 4736 oz

## 2019-11-24 LAB — POC SARS CORONAVIRUS 2 AG -  ED: SARS Coronavirus 2 Ag: NEGATIVE

## 2019-11-24 LAB — TROPONIN I (HIGH SENSITIVITY)
Troponin I (High Sensitivity): 8 ng/L (ref <18)
Troponin I (High Sensitivity): 10 ng/L (ref <18)

## 2019-11-24 LAB — RESPIRATORY PANEL BY RT PCR (FLU A&B, COVID)
Influenza A by PCR: NEGATIVE
Influenza B by PCR: NEGATIVE
SARS Coronavirus 2 by RT PCR: NEGATIVE

## 2019-11-24 LAB — FIBRIN DERIVATIVES D-DIMER (ARMC ONLY): Fibrin derivatives D-dimer (ARMC): 4107.57 ng/mL (FEU) — ABNORMAL HIGH (ref 0.00–499.00)

## 2019-11-24 LAB — TSH: TSH: 2.776 u[IU]/mL (ref 0.350–4.500)

## 2019-11-24 LAB — BRAIN NATRIURETIC PEPTIDE: B Natriuretic Peptide: 123 pg/mL — ABNORMAL HIGH (ref 0.0–100.0)

## 2019-11-24 LAB — HEMOGLOBIN A1C
Hgb A1c MFr Bld: 9.1 % — ABNORMAL HIGH (ref 4.8–5.6)
Mean Plasma Glucose: 214.47 mg/dL

## 2019-11-24 LAB — GLUCOSE, CAPILLARY
Glucose-Capillary: 301 mg/dL — ABNORMAL HIGH (ref 70–99)
Glucose-Capillary: 306 mg/dL — ABNORMAL HIGH (ref 70–99)
Glucose-Capillary: 221 mg/dL — ABNORMAL HIGH (ref 70–99)

## 2019-11-24 LAB — MAGNESIUM: Magnesium: 1.6 mg/dL — ABNORMAL LOW (ref 1.7–2.4)

## 2019-11-24 MED ORDER — SODIUM CHLORIDE 0.9% FLUSH
3.0000 mL | Freq: Two times a day (BID) | INTRAVENOUS | Status: DC
  Administered 2019-11-25 – 2019-11-26 (×2): 3 mL via INTRAVENOUS

## 2019-11-24 MED ORDER — ONDANSETRON HCL 4 MG/2ML IJ SOLN: 4.0000 mg | Freq: Four times a day (QID) | INTRAMUSCULAR | Status: DC | PRN

## 2019-11-24 MED ORDER — ACETAMINOPHEN 325 MG PO TABS: 650.0000 mg | ORAL_TABLET | ORAL | Status: DC | PRN

## 2019-11-24 MED ORDER — INSULIN ASPART 100 UNIT/ML ~~LOC~~ SOLN: 3.0000 [IU] | Freq: Three times a day (TID) | SUBCUTANEOUS | Status: DC

## 2019-11-24 MED ORDER — FUROSEMIDE 10 MG/ML IJ SOLN
40.0000 mg | Freq: Two times a day (BID) | INTRAMUSCULAR | Status: DC
  Administered 2019-11-24 – 2019-11-26 (×4): 40 mg via INTRAVENOUS
  Filled 2019-11-24 (×4): qty 4

## 2019-11-24 MED ORDER — INSULIN ASPART 100 UNIT/ML ~~LOC~~ SOLN
SUBCUTANEOUS | Status: AC
  Filled 2019-11-24: qty 1

## 2019-11-24 MED ORDER — LABETALOL HCL 200 MG PO TABS
200.0000 mg | ORAL_TABLET | Freq: Two times a day (BID) | ORAL | Status: DC
  Administered 2019-11-24 – 2019-11-25 (×2): 200 mg via ORAL
  Filled 2019-11-24 (×3): qty 1

## 2019-11-24 MED ORDER — INSULIN ASPART 100 UNIT/ML ~~LOC~~ SOLN
0.0000 [IU] | Freq: Every day | SUBCUTANEOUS | Status: DC
  Administered 2019-11-24 – 2019-11-25 (×2): 2 [IU] via SUBCUTANEOUS
  Filled 2019-11-24 (×2): qty 1

## 2019-11-24 MED ORDER — INSULIN ASPART 100 UNIT/ML ~~LOC~~ SOLN: 0.0000 [IU] | Freq: Three times a day (TID) | SUBCUTANEOUS | Status: DC

## 2019-11-24 MED ORDER — FUROSEMIDE 10 MG/ML IJ SOLN
40.0000 mg | Freq: Once | INTRAMUSCULAR | Status: AC
  Administered 2019-11-24: 40 mg via INTRAVENOUS
  Filled 2019-11-24: qty 4

## 2019-11-24 MED ORDER — IOHEXOL 350 MG/ML SOLN
75.0000 mL | Freq: Once | INTRAVENOUS | Status: AC | PRN
  Administered 2019-11-24: 75 mL via INTRAVENOUS
  Filled 2019-11-24: qty 75

## 2019-11-24 MED ORDER — NITROGLYCERIN 2 % TD OINT
1.0000 [in_us] | TOPICAL_OINTMENT | Freq: Once | TRANSDERMAL | Status: AC
  Administered 2019-11-24: 1 [in_us] via TOPICAL
  Filled 2019-11-24: qty 1

## 2019-11-24 MED ORDER — INSULIN ASPART 100 UNIT/ML ~~LOC~~ SOLN
0.0000 [IU] | Freq: Three times a day (TID) | SUBCUTANEOUS | Status: DC
  Administered 2019-11-24: 15 [IU] via SUBCUTANEOUS
  Administered 2019-11-25: 4 [IU] via SUBCUTANEOUS
  Administered 2019-11-25: 7 [IU] via SUBCUTANEOUS
  Administered 2019-11-25: 4 [IU] via SUBCUTANEOUS
  Administered 2019-11-26: 7 [IU] via SUBCUTANEOUS
  Filled 2019-11-24 (×4): qty 1

## 2019-11-24 MED ORDER — NITROGLYCERIN IN D5W 200-5 MCG/ML-% IV SOLN
0.0000 ug/min | INTRAVENOUS | Status: DC
  Administered 2019-11-24: 50 ug/min via INTRAVENOUS
  Filled 2019-11-24 (×3): qty 250

## 2019-11-24 MED ORDER — METOPROLOL TARTRATE 25 MG PO TABS
12.5000 mg | ORAL_TABLET | Freq: Two times a day (BID) | ORAL | Status: DC
  Administered 2019-11-24: 12.5 mg via ORAL
  Filled 2019-11-24: qty 1

## 2019-11-24 MED ORDER — HEPARIN SODIUM (PORCINE) 5000 UNIT/ML IJ SOLN
5000.0000 [IU] | Freq: Three times a day (TID) | INTRAMUSCULAR | Status: DC
  Administered 2019-11-24 – 2019-11-26 (×5): 5000 [IU] via SUBCUTANEOUS
  Filled 2019-11-24 (×5): qty 1

## 2019-11-24 MED ORDER — INSULIN ASPART 100 UNIT/ML ~~LOC~~ SOLN
3.0000 [IU] | Freq: Three times a day (TID) | SUBCUTANEOUS | Status: DC
  Administered 2019-11-24: 3 [IU] via SUBCUTANEOUS

## 2019-11-24 MED ORDER — SODIUM CHLORIDE 0.9 % IV SOLN: 250.0000 mL | INTRAVENOUS | Status: DC | PRN

## 2019-11-24 MED ORDER — SODIUM CHLORIDE 0.9% FLUSH: 3.0000 mL | INTRAVENOUS | Status: DC | PRN

## 2019-11-24 NOTE — ED Triage Notes (Signed)
Pt reports SHOB that started 1 hour ago - Pt is [redacted] weeks pregnant Pt denies chest pain, dizziness, headache, pain
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
## 2019-11-24 NOTE — Progress Notes (Signed)
Pt ok for off BiPAP trial per attending MD. BiPAP mask removed from pt. Pt placed on 2Lnc, O2 sat in High 90's. Will continue to monitor closely.

## 2019-11-24 NOTE — Progress Notes (Signed)
*  PRELIMINARY RESULTS* Echocardiogram 2D Echocardiogram has been performed.  Lauren Delgado 11/24/2019, 11:02 AM

## 2019-11-24 NOTE — Progress Notes (Signed)
Same day rounding progress note  Patient seen and examined while in the ED.  Waiting for Floor bed  34 y.o. female with a hx of CAD with multivessel PCI, diastolic heart failure, DM, HTN, HLD, pregnancy of 6 weeks, and obesity  admitted for respiratory failure  Acute respiratory failure with hypoxia ([place]) -Patient was satting at 78% on room air and had increased work of breathing -Likely due to flash pulmonary edema -Weaned from BiPAP and now on 2 L oxygen by nasal cannula.  Continue weaning efforts -Lower extremity Dopplers negative for DVT -CT angio to rule out PE considering her gravid status, obesity and respiratory failure. - D-dimer was elevated on admission as well  Acute on chronic systolic CHF (congestive heart failure) (HCC) Hypertensive emergency Cardiomyopathy ([place]) -IV Lasix 40 mg twice daily -Continue nitroglycerin infusion and titrate off as able -Echocardiogram pending, last echo was in March 2019 with EF 35 to 40% -Cardiology input appreciated   Coronary artery disease   History of MI 2019 status post coronary artery stent placement -No complaints of chest pain and EKG with no acute ST-T wave changes -Continue management as outlined above -Cardiology input appreciated  Pregnancy, high risk -Consult OB given high risk pregnancy    Hyperglycemia due to type 2 diabetes mellitus (HCC) -Regular insulin sliding scale coverage for now    Morbid obesity ([place]) -This complicates overall prognosis and care   Time spent: 20 mins
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
## 2019-11-24 NOTE — ED Notes (Signed)
Pt on cell phone  Family with pt. meds infusing.  meds given.  Pt alert.

## 2019-11-24 NOTE — ED Notes (Signed)
Pt [facility]G 306, no insulin orders in place. Pt reports diabetic and takes insulin at home.  Dr. Rolin Barry for insulin orders. Per Dr. Manuella Ghazi ok for insulin orders to start now
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
## 2019-11-24 NOTE — ED Notes (Signed)
bp down.  ntg dose changed to lower dose.  Pt alert  Family with pt.

## 2019-11-24 NOTE — ED Notes (Signed)
fsbs 221
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
## 2019-11-25 ENCOUNTER — Inpatient Hospital Stay: Payer: Medicaid Other

## 2019-11-25 DIAGNOSIS — Z955 Presence of coronary angioplasty implant and graft: Secondary | ICD-10-CM

## 2019-11-25 DIAGNOSIS — Z794 Long term (current) use of insulin: Secondary | ICD-10-CM

## 2019-11-25 DIAGNOSIS — I161 Hypertensive emergency: Secondary | ICD-10-CM

## 2019-11-25 DIAGNOSIS — E0865 Diabetes mellitus due to underlying condition with hyperglycemia: Secondary | ICD-10-CM

## 2019-11-25 DIAGNOSIS — J810 Acute pulmonary edema: Secondary | ICD-10-CM

## 2019-11-25 LAB — BASIC METABOLIC PANEL
Anion gap: 9 (ref 5–15)
BUN: 17 mg/dL (ref 6–20)
CO2: 23 mmol/L (ref 22–32)
Calcium: 8.1 mg/dL — ABNORMAL LOW (ref 8.9–10.3)
Chloride: 103 mmol/L (ref 98–111)
Creatinine, Ser: 0.81 mg/dL (ref 0.44–1.00)
GFR calc Af Amer: >60 mL/min (ref >60)
GFR calc non Af Amer: >60 mL/min (ref >60)
Glucose, Bld: 272 mg/dL — ABNORMAL HIGH (ref 70–99)
Potassium: 3.5 mmol/L (ref 3.5–5.1)
Sodium: 135 mmol/L (ref 135–145)

## 2019-11-25 LAB — GLUCOSE, CAPILLARY
Glucose-Capillary: 206 mg/dL — ABNORMAL HIGH (ref 70–99)
Glucose-Capillary: 165 mg/dL — ABNORMAL HIGH (ref 70–99)
Glucose-Capillary: 154 mg/dL — ABNORMAL HIGH (ref 70–99)
Glucose-Capillary: 220 mg/dL — ABNORMAL HIGH (ref 70–99)

## 2019-11-25 LAB — CBC
HCT: 26.1 % — ABNORMAL LOW (ref 36.0–46.0)
Hemoglobin: 8.6 g/dL — ABNORMAL LOW (ref 12.0–15.0)
MCH: 29.8 pg (ref 26.0–34.0)
MCHC: 33 g/dL (ref 30.0–36.0)
MCV: 90.3 fL (ref 80.0–100.0)
Platelets: 258 10*3/uL (ref 150–400)
RBC: 2.89 MIL/uL — ABNORMAL LOW (ref 3.87–5.11)
RDW: 13.1 % (ref 11.5–15.5)
WBC: 9.5 10*3/uL (ref 4.0–10.5)
nRBC: 0 % (ref 0.0–0.2)

## 2019-11-25 LAB — HIV ANTIBODY (ROUTINE TESTING W REFLEX): HIV Screen 4th Generation wRfx: NONREACTIVE

## 2019-11-25 MED ORDER — ISOSORBIDE MONONITRATE ER 60 MG PO TB24
60.0000 mg | ORAL_TABLET | Freq: Every day | ORAL | Status: DC
  Administered 2019-11-25 – 2019-11-26 (×2): 60 mg via ORAL
  Filled 2019-11-25 (×2): qty 1

## 2019-11-25 MED ORDER — LABETALOL HCL 200 MG PO TABS
200.0000 mg | ORAL_TABLET | Freq: Three times a day (TID) | ORAL | Status: DC
  Administered 2019-11-25 – 2019-11-26 (×2): 200 mg via ORAL
  Filled 2019-11-25 (×7): qty 1

## 2019-11-25 MED ORDER — INSULIN GLARGINE 100 UNIT/ML ~~LOC~~ SOLN
30.0000 [IU] | Freq: Every day | SUBCUTANEOUS | Status: DC
  Filled 2019-11-25: qty 0.3

## 2019-11-25 MED ORDER — INSULIN ASPART 100 UNIT/ML ~~LOC~~ SOLN: 0.0000 [IU] | Freq: Three times a day (TID) | SUBCUTANEOUS | Status: DC

## 2019-11-25 MED ORDER — INSULIN NPH (HUMAN) (ISOPHANE) 100 UNIT/ML ~~LOC~~ SUSP: 30.0000 [IU] | Freq: Every day | SUBCUTANEOUS | Status: DC

## 2019-11-25 MED ORDER — NIFEDIPINE ER 60 MG PO TB24
60.0000 mg | ORAL_TABLET | Freq: Every day | ORAL | Status: DC
  Administered 2019-11-25: 60 mg via ORAL
  Filled 2019-11-25 (×2): qty 1

## 2019-11-25 MED ORDER — HYDRALAZINE HCL 25 MG PO TABS
25.0000 mg | ORAL_TABLET | Freq: Three times a day (TID) | ORAL | Status: DC
  Administered 2019-11-25 (×2): 25 mg via ORAL
  Filled 2019-11-25 (×2): qty 1

## 2019-11-25 MED ORDER — INSULIN ASPART 100 UNIT/ML ~~LOC~~ SOLN
5.0000 [IU] | Freq: Three times a day (TID) | SUBCUTANEOUS | Status: DC
  Administered 2019-11-25 (×2): 5 [IU] via SUBCUTANEOUS
  Filled 2019-11-25 (×2): qty 1

## 2019-11-25 MED ORDER — INSULIN GLARGINE 100 UNIT/ML ~~LOC~~ SOLN
12.0000 [IU] | Freq: Every day | SUBCUTANEOUS | Status: DC
  Administered 2019-11-25: 12 [IU] via SUBCUTANEOUS
  Filled 2019-11-25: qty 0.12

## 2019-11-25 MED ORDER — ASPIRIN 81 MG PO CHEW
81.0000 mg | CHEWABLE_TABLET | Freq: Every day | ORAL | Status: DC
  Administered 2019-11-25 – 2019-11-26 (×2): 81 mg via ORAL
  Filled 2019-11-25 (×2): qty 1

## 2019-11-25 MED ORDER — ADULT MULTIVITAMIN W/MINERALS CH
1.0000 | ORAL_TABLET | Freq: Every day | ORAL | Status: DC
  Administered 2019-11-25 – 2019-11-26 (×2): 1 via ORAL
  Filled 2019-11-25 (×2): qty 1

## 2019-11-25 MED ORDER — SENNOSIDES-DOCUSATE SODIUM 8.6-50 MG PO TABS
1.0000 | ORAL_TABLET | Freq: Two times a day (BID) | ORAL | Status: DC
  Filled 2019-11-25: qty 1

## 2019-11-25 MED ORDER — DOCUSATE SODIUM 100 MG PO CAPS: 100.0000 mg | ORAL_CAPSULE | Freq: Two times a day (BID) | ORAL | Status: DC

## 2019-11-25 NOTE — ED Notes (Signed)
Pt resting at this time, family at bedside. Denies any needs at this time.

## 2019-11-25 NOTE — ED Notes (Addendum)
Report off to [employer]

## 2019-11-25 NOTE — Progress Notes (Signed)
Obstetric and Gynecology  Subjective  Feeling better from breathing standpoint.  No vaginal bleeding or pregnancy concerns  Objective  Vital signs in last 24 hours: Temp:  [98.6 F (37 C)] 98.6 F (37 C) (03/24 1559) Pulse Rate:  [81-100] 97 (03/24 1559) Resp:  [3-31] 18 (03/24 1559) BP: (102-157)/(45-96) 140/74 (03/24 1559) SpO2:  [84 %-99 %] 97 % (03/24 1559) Weight:  [134.2 kg] 134.2 kg (03/24 1624) Last BM Date: (unknown, pt does not remember)  No intake or output data in the 24 hours ending 11/25/19 1645  Labs: Results for orders placed or performed during the hospital encounter of 11/24/19 (from the past 24 hour(s))  Hemoglobin A1c     Status: Abnormal   Collection Time: 11/24/19  5:38 PM  Result Value Ref Range   Hgb A1c MFr Bld 9.1 (H) 4.8 - 5.6 %   Mean Plasma Glucose 214.47 mg/dL  Glucose, capillary     Status: Abnormal   Collection Time: 11/24/19  5:54 PM  Result Value Ref Range   Glucose-Capillary 306 (H) 70 - 99 mg/dL  Glucose, capillary     Status: Abnormal   Collection Time: 11/24/19 10:47 PM  Result Value Ref Range   Glucose-Capillary 221 (H) 70 - 99 mg/dL  Basic metabolic panel     Status: Abnormal   Collection Time: 11/25/19  5:06 AM  Result Value Ref Range   Sodium 135 135 - 145 mmol/L   Potassium 3.5 3.5 - 5.1 mmol/L   Chloride 103 98 - 111 mmol/L   CO2 23 22 - 32 mmol/L   Glucose, Bld 272 (H) 70 - 99 mg/dL   BUN 17 6 - 20 mg/dL   Creatinine, Ser 0.81 0.44 - 1.00 mg/dL   Calcium 8.1 (L) 8.9 - 10.3 mg/dL   GFR calc non Af Amer >60 >60 mL/min   GFR calc Af Amer >60 >60 mL/min   Anion gap 9 5 - 15  CBC     Status: Abnormal   Collection Time: 11/25/19  5:06 AM  Result Value Ref Range   WBC 9.5 4.0 - 10.5 K/uL   RBC 2.89 (L) 3.87 - 5.11 MIL/uL   Hemoglobin 8.6 (L) 12.0 - 15.0 g/dL   HCT 26.1 (L) 36.0 - 46.0 %   MCV 90.3 80.0 - 100.0 fL   MCH 29.8 26.0 - 34.0 pg   MCHC 33.0 30.0 - 36.0 g/dL   RDW 13.1 11.5 - 15.5 %   Platelets 258 150 - 400  K/uL   nRBC 0.0 0.0 - 0.2 %  Glucose, capillary     Status: Abnormal   Collection Time: 11/25/19  9:29 AM  Result Value Ref Range   Glucose-Capillary 206 (H) 70 - 99 mg/dL  Glucose, capillary     Status: Abnormal   Collection Time: 11/25/19  1:37 PM  Result Value Ref Range   Glucose-Capillary 165 (H) 70 - 99 mg/dL  Glucose, capillary     Status: Abnormal   Collection Time: 11/25/19  4:30 PM  Result Value Ref Range   Glucose-Capillary 154 (H) 70 - 99 mg/dL    Cultures: Results for orders placed or performed during the hospital encounter of 11/24/19  Respiratory Panel by RT PCR (Flu A&B, Covid) - Nasopharyngeal Swab     Status: None   Collection Time: 11/24/19  4:15 AM   Specimen: Nasopharyngeal Swab  Result Value Ref Range Status   SARS Coronavirus 2 by RT PCR NEGATIVE NEGATIVE Final    Comment: (  NOTE) SARS-CoV-2 target nucleic acids are NOT DETECTED. The SARS-CoV-2 RNA is generally detectable in upper respiratoy specimens during the acute phase of infection. The lowest concentration of SARS-CoV-2 viral copies this assay can detect is 131 copies/mL. A negative result does not preclude SARS-Cov-2 infection and should not be used as the sole basis for treatment or other patient management decisions. A negative result may occur with  improper specimen collection/handling, submission of specimen other than nasopharyngeal swab, presence of viral mutation(s) within the areas targeted by this assay, and inadequate number of viral copies (<131 copies/mL). A negative result must be combined with clinical observations, patient history, and epidemiological information. The expected result is Negative. Fact Sheet for Patients:  PinkCheek.be Fact Sheet for Healthcare Providers:  GravelBags.it This test is not yet ap proved or cleared by the [place] FDA and  has been authorized for detection and/or diagnosis of SARS-CoV-2  by FDA under an Emergency Use Authorization (EUA). This EUA will remain  in effect (meaning this test can be used) for the duration of the COVID-19 declaration under Section 564(b)(1) of the Act, 21 U.S.C. section 360bbb-3(b)(1), unless the authorization is terminated or revoked sooner.    Influenza A by PCR NEGATIVE NEGATIVE Final   Influenza B by PCR NEGATIVE NEGATIVE Final    Comment: (NOTE) The Xpert Xpress SARS-CoV-2/FLU/RSV assay is intended as an aid in  the diagnosis of influenza from Nasopharyngeal swab specimens and  should not be used as a sole basis for treatment. Nasal washings and  aspirates are unacceptable for Xpert Xpress SARS-CoV-2/FLU/RSV  testing. Fact Sheet for Patients: PinkCheek.be Fact Sheet for Healthcare Providers: GravelBags.it This test is not yet approved or cleared by the [place] FDA and  has been authorized for detection and/or diagnosis of SARS-CoV-2 by  FDA under an Emergency Use Authorization (EUA). This EUA will remain  in effect (meaning this test can be used) for the duration of the  Covid-19 declaration under Section 564(b)(1) of the Act, 21  U.S.C. section 360bbb-3(b)(1), unless the authorization is  terminated or revoked. Performed at [facility], [address], [place], [place] [address]     Imaging:  Assessment   34 y.o. G2P1001 at [redacted]w[redacted]d admitted with pulmonary edema in setting of known CHF  Plan    1) Hypertension - no concerns with current regimen of hydralazine,labetalol, nifedipine, and imdur  2) Pulmonary edema/acute diastolic CHF - negative PE CT,  Echo with EF 55-60% today with mild left ventricular hypertrophy.  May continue Lasix as needed to achieve diuresis without pregnancy concerns - I discussed that should CXR be needed by primary team to follow for signs of interstitial edema or pulmonary infiltrates these are acceptable with proper abdominal  shielding.    3) Diabetes - continue Insulin.  In the acute setting not significant issue but during pregnancy fasting and 2-hr postprandial sugars are usually monitored with goals for fasting >95, postprandial under <140.  Regimen can be adjusted outpatient.    4) DVT ppx - both heparin and lovenox acceptable  Will continue to follow as need or if questions by primary team.  Discussed that at present no additional interventions needed from pregnancy standpoint other than optimizing maternal well being in order to assure fetal well being.

## 2019-11-25 NOTE — Progress Notes (Addendum)
Inpatient Diabetes Program Recommendations  Diabetes Treatment Program Recommendations  ADA Standards of Care Diabetes in Pregnancy Target Glucose Ranges:  Fasting: 60 - 90 mg/dL Preprandial: 60 - 105 mg/dL 1 hr postprandial: Less than 140mg /dL (from first bite of meal) 2 hr postprandial: Less than 120 mg/dL (from first bite of meal)    Results for Lauren Delgado, Lauren Delgado (MRN [id number]) as of 11/25/2019 10:26  Ref. Range 11/24/2019 16:29 11/24/2019 17:54 11/24/2019 22:47 11/25/2019 09:29  Glucose-Capillary Latest Ref Range: 70 - 99 mg/dL 301 (H) 306 (H) 221 (H) 206 (H)  Results for Lauren Delgado, Lauren Delgado (MRN [id number]) as of 11/25/2019 10:26  Ref. Range 11/01/2017 20:54 11/24/2019 17:38  Hemoglobin A1C Latest Ref Range: 4.8 - 5.6 % 14.6 (H) 9.1 (H)   Review of Glycemic Control  Diabetes history: DM2 Outpatient Diabetes medications: Lantus 20 units QAM, Novolog 0-20 units TID Current orders for Inpatient glycemic control: Lantus 12 units daily, Novolog 5 units TID with meals, Novolog 0-20 units TID with meals, Novolog 0-5 units QHS  Inpatient Diabetes Program Recommendations:    Insulin-Basal: Please consider increasing Lantus to 30 units daily (or change basal insulin to NPH 15 units BID).   Insulin-Correction: Please consider discontinuing current Novolog correction orders and use Diabetes Treatment for Pregnant/Postpartum order set to order CBGs QID (fasting and 2 hour post partum) and Novolog correction 0-16 units QID (fasting at 6am, 2 hour post partum at 10:00, 14:00, and 19:00).  NOTE: Noted patient has DM2 hx and is [redacted] weeks pregnant. Therefore, glucose goals will be tighter (fasting <90 mg/dl, 2 hour post prandial <120 mg/dl). Noted patient seen Dr. Kris Mouton on 11/13/19 and per office note A1C 8.7%, patient was on Glipizide which was stopped and Lantus 20 units daily and Novolog correction scale was started. Spoke with patient over the phone and she reports that since she started taking  Lantus and Novolog, her fasting glucose has been in the 100-200's mg/dl and glucose in the afternoon is 200-300's mg/dl. Discussed glucose goals during pregnancy (fasting <90 mg/dl and 2 hour post prandial <120 mg/dl). Discussed that additional adjustments likely need to be made with insulin regimen to improve glycemic control. Discussed importance of glycemic control during pregnancy and lost call. Attempted to call back and went straight to voicemail.  Would encourage patient to follow up with OB and PCP regarding improving DM control.    Thanks, Barnie Alderman, RN, MSN, CDE Diabetes Coordinator Inpatient Diabetes Program [phone] (Team Pager from 8am to 5pm)

## 2019-11-25 NOTE — TOC Initial Note (Signed)
Transition of Care [facility]) - Initial/Assessment Note    Patient Details  Name: Lauren Delgado MRN: [id number] Date of Birth: 1985/12/20  Transition of [facility]) CM/SW Contact:    Ova Freshwater Phone Number: [phone] 11/25/2019, 1:34 PM  Clinical Narrative:                 Patient presents at [facility]/ED with shortness of breath.  This CSW explained to patient consult information with goal of SNF placement.  Patient stated she is able to complete all ADLs for herself and her son and has no difficulty with standing, sitting, bathing or dressing herself, or assisting her son.  Patient also has assistance from her mother Bronson Curb, who cares for her son when patient is at work.  Patient stated she would be "okay with home health" but does not think she needs it. Patient stated she is seven weeks pregnant.  Expected Discharge Plan: Skilled Nursing Facility Barriers to Discharge: No Barriers Identified   Patient Goals and CMS Choice Patient states their goals for this hospitalization and ongoing recovery are:: "I don't want to go to a nursing facility.  I'll consider home health, but I don't think I need it.      Expected Discharge Plan and Services Expected Discharge Plan: [place] In-house Referral: Clinical Social Work     Living arrangements for the past 2 months: Single Family Home                                      Prior Living Arrangements/Services Living arrangements for the past 2 months: Single Family Home Lives with:: Minor Children(5 y/o son) Patient language and need for interpreter reviewed:: Yes Do you feel safe going back to the place where you live?: Yes      Need for Family Participation in Patient Care: No (Comment) Care giver support system in place?: Yes (comment)   Criminal Activity/Legal Involvement Pertinent to Current Situation/Hospitalization: No - Comment as needed  Activities of Daily Living      Permission  Sought/Granted Permission sought to share information with : Family Supports Permission granted to share information with : Yes, Verbal Permission Granted  Share Information with NAME: Angelia Pennix     Permission granted to share info w Relationship: mother  Permission granted to share info w Contact Information: [phone]  Emotional Assessment Appearance:: Appears stated age Attitude/Demeanor/Rapport: Engaged Affect (typically observed): Pleasant Orientation: : Oriented to Self, Oriented to Place, Oriented to  Time, Oriented to Situation Alcohol / Substance Use: Not Applicable Psych Involvement: No (comment)  Admission diagnosis:  Flash pulmonary edema ([place]) [J81.0] Patient Active Problem List   Diagnosis Date Noted  . Acute on chronic systolic CHF (congestive heart failure) ([place]) 11/24/2019  . Hyperglycemia due to type 2 diabetes mellitus ([place]) 11/24/2019  . Hypertensive emergency 11/24/2019  . Flash pulmonary edema ([place]) 11/24/2019  . Acute respiratory failure with hypoxia ([place]) 11/24/2019  . Proteinuria affecting pregnancy, antepartum 11/20/2019  . Supervision of high risk pregnancy, antepartum 11/18/2019  . History of herpes genitalis 11/16/2019  . Pregnant 11/13/2019  . Sleep apnea 07/02/2019  . Healthcare maintenance 02/12/2019  . Lipoma of right lower extremity 08/20/2018  . Coronary artery disease 12/20/2017  . Hypertension 12/20/2017  . Status post coronary artery stent placement   . Cardiomyopathy ([place]) 11/03/2017  . Hyperlipidemia associated with type 2 diabetes mellitus ([place]) 11/02/2017  .  Abdominal obesity and metabolic syndrome 123456  . NSTEMI (non-ST elevated myocardial infarction) ([place]) 11/01/2017  . ACS (acute coronary syndrome) ([place]) 11/01/2017  . Type 2 diabetes mellitus without complication ([place]) 99991111  . History of cesarean section 09/11/2014  . Polycystic ovaries 10/31/2006  . Morbid obesity ([place]) 10/31/2006   PCP:  Guadalupe Dawn,  MD Pharmacy:   [facility] [address] ([address]), [place] [address] [place]) [place] [address] Phone: [phone] Fax: [place], [place] - [address] [place] [address] Phone: [phone] Fax: [phone]     Social Determinants of Health (SDOH) Interventions    Readmission Risk Interventions No flowsheet data found.

## 2019-11-25 NOTE — Progress Notes (Signed)
PROGRESS NOTE                                                                                                                                                                                                             Patient Demographics:    Lauren Delgado, is a 34 y.o. female, DOB - September 02, 1986, [id number]  Admit date - 11/24/2019   Admitting Physician Louellen Molder, MD  Outpatient Primary MD for the patient is Guadalupe Dawn, MD  LOS - 1  Outpatient Specialists: Will be  Chief Complaint  Patient presents with  . Shortness of Breath       Brief Narrative 34 year old morbidly obese female who is [redacted] weeks pregnant, history of cardiomyopathy with EF of 35-40% presented with acute onset of dyspnea and was hypoxic in the 48s along with accelerated hypertension with systolic blood pressure up to 200 mmHg. CT angiogram of the chest done in ED negative for PE but showed acute pulmonary edema. Received IV Lasix with improvement and placed on nitroglycerin drip for accelerated hypertension. Cardiology consulted and admitted to hospital service.   Subjective:   Still on nitroglycerin drip this morning and blood pressure better controlled.  Feels her breathing to be better.   Assessment  & Plan :    Principal Problem:   Acute on chronic diastolic CHF (congestive heart failure) (HCC) 2D echo done this admission shows normal LVEF and grade 2 diastolic dysfunction. CT angiogram negative for PE.  Continue IV Lasix twice daily with strict I's/O and daily weight.  Resume home dose hydralazine and add nifedipine.  Nitroglycerin drip titrated and discontinued. Cardiology consult appreciated.  Active Problems: Accelerated hypertension/hypertensive urgency Likely secondary to acute pulmonary edema.  Blood pressure better controlled with IV nitroglycerin and labetalol.  Now nitroglycerin weaned and discontinued.  Resume  hydralazine (dose increased to 3 times daily).  Continue labetalol and resume nifedipine (blood pressure better controlled once she has started taking in the p.m.). Counseled on salt restriction in diet and avoiding junk food.  CAD with history of coronary artery stent. Asymptomatic.  Continue aspirin and labetalol.  EF improved on repeat echo.  Diabetes mellitus type 2, uncontrolled CBG uncontrolled.  A1c of 8.7.  On glipizide which was recently stopped and patient started on Lantus 20 units daily with NovoLog corrective scale. CBGs in 200s-300s.  Increase Lantus  to 20 units at bedtime and switch to antepartum diabetic correctional sliding scale. Diabetes coordinator.  Following.  Needs tight blood glucose control.  OB consult appreciated while in the hospital.  Antenatal care Resume prenatal vitamin.  OB consult appreciated.    Code Status : Full code  Family Communication  : Mother at bedside  Disposition Plan  : Stable to be admitted to telemetry.  Barriers For Discharge : Hyperglycemia, uncontrolled hypertension  Consults  : Cardiology  Procedures  : 2D echo  DVT Prophylaxis  : SCDs  Lab Results  Component Value Date   PLT 258 11/25/2019    Antibiotics  :    Anti-infectives (From admission, onward)   None        Objective:   Vitals:   11/25/19 1230 11/25/19 1300 11/25/19 1330 11/25/19 1400  BP: 132/65 118/65 133/72 138/72  Pulse: 90 85 88 84  Resp: (!) 25 (!) 29 20 (!) 28  Temp:      SpO2: 98% 92% 92% 92%  Weight:      Height:        Wt Readings from Last 3 Encounters:  11/24/19 134.3 kg  11/18/19 134.8 kg  11/13/19 134.7 kg    No intake or output data in the 24 hours ending 11/25/19 1442   Physical Exam  Gen: not in distress HEENT:moist mucosa, supple neck Chest: Fine bibasilar crackles, no rhonchi or wheeze CVS: N S1&S2, no murmurs,  GI: soft, NT, ND, Musculoskeletal: warm, no edema     Data Review:    CBC Recent Labs  Lab  11/24/19 0245 11/25/19 0506  WBC 14.2* 9.5  HGB 10.3* 8.6*  HCT 31.9* 26.1*  PLT 302 258  MCV 91.1 90.3  MCH 29.4 29.8  MCHC 32.3 33.0  RDW 13.1 13.1  LYMPHSABS 1.8  --   MONOABS 0.5  --   EOSABS 0.0  --   BASOSABS 0.0  --     Chemistries  Recent Labs  Lab 11/24/19 0245 11/25/19 0506  NA 135 135  K 3.7 3.5  CL 104 103  CO2 19* 23  GLUCOSE 289* 272*  BUN 15 17  CREATININE 0.80 0.81  CALCIUM 8.5* 8.1*  MG 1.6*  --   AST 21  --   ALT 18  --   ALKPHOS 65  --   BILITOT 0.9  --    ------------------------------------------------------------------------------------------------------------------ No results for input(s): CHOL, HDL, LDLCALC, TRIG, CHOLHDL, LDLDIRECT in the last 72 hours.  Lab Results  Component Value Date   HGBA1C 9.1 (H) 11/24/2019   ------------------------------------------------------------------------------------------------------------------ Recent Labs    11/24/19 0245  TSH 2.776   ------------------------------------------------------------------------------------------------------------------ No results for input(s): VITAMINB12, FOLATE, FERRITIN, TIBC, IRON, RETICCTPCT in the last 72 hours.  Coagulation profile Recent Labs  Lab 11/24/19 0245  INR 1.0    No results for input(s): DDIMER in the last 72 hours.  Cardiac Enzymes No results for input(s): CKMB, TROPONINI, MYOGLOBIN in the last 168 hours.  Invalid input(s): CK ------------------------------------------------------------------------------------------------------------------    Component Value Date/Time   BNP 123.0 (H) 11/24/2019 0245    Inpatient Medications  Scheduled Meds: . furosemide  40 mg Intravenous Q12H  . heparin  5,000 Units Subcutaneous Q8H  . hydrALAZINE  25 mg Oral TID  . insulin aspart  0-20 Units Subcutaneous TID WC  . insulin aspart  0-5 Units Subcutaneous QHS  . insulin aspart  5 Units Subcutaneous TID WC  . insulin glargine  12 Units Subcutaneous  Daily  .  isosorbide mononitrate  60 mg Oral Daily  . labetalol  200 mg Oral TID  . NIFEdipine  60 mg Oral QHS  . sodium chloride flush  3 mL Intravenous Q12H   Continuous Infusions: . sodium chloride     PRN Meds:.sodium chloride, acetaminophen, ondansetron (ZOFRAN) IV, sodium chloride flush  Micro Results Recent Results (from the past 240 hour(s))  Respiratory Panel by RT PCR (Flu A&B, Covid) - Nasopharyngeal Swab     Status: None   Collection Time: 11/24/19  4:15 AM   Specimen: Nasopharyngeal Swab  Result Value Ref Range Status   SARS Coronavirus 2 by RT PCR NEGATIVE NEGATIVE Final    Comment: (NOTE) SARS-CoV-2 target nucleic acids are NOT DETECTED. The SARS-CoV-2 RNA is generally detectable in upper respiratoy specimens during the acute phase of infection. The lowest concentration of SARS-CoV-2 viral copies this assay can detect is 131 copies/mL. A negative result does not preclude SARS-Cov-2 infection and should not be used as the sole basis for treatment or other patient management decisions. A negative result may occur with  improper specimen collection/handling, submission of specimen other than nasopharyngeal swab, presence of viral mutation(s) within the areas targeted by this assay, and inadequate number of viral copies (<131 copies/mL). A negative result must be combined with clinical observations, patient history, and epidemiological information. The expected result is Negative. Fact Sheet for Patients:  PinkCheek.be Fact Sheet for Healthcare Providers:  GravelBags.it This test is not yet ap proved or cleared by the [place] FDA and  has been authorized for detection and/or diagnosis of SARS-CoV-2 by FDA under an Emergency Use Authorization (EUA). This EUA will remain  in effect (meaning this test can be used) for the duration of the COVID-19 declaration under Section 564(b)(1) of the Act, 21  U.S.C. section 360bbb-3(b)(1), unless the authorization is terminated or revoked sooner.    Influenza A by PCR NEGATIVE NEGATIVE Final   Influenza B by PCR NEGATIVE NEGATIVE Final    Comment: (NOTE) The Xpert Xpress SARS-CoV-2/FLU/RSV assay is intended as an aid in  the diagnosis of influenza from Nasopharyngeal swab specimens and  should not be used as a sole basis for treatment. Nasal washings and  aspirates are unacceptable for Xpert Xpress SARS-CoV-2/FLU/RSV  testing. Fact Sheet for Patients: PinkCheek.be Fact Sheet for Healthcare Providers: GravelBags.it This test is not yet approved or cleared by the [place] FDA and  has been authorized for detection and/or diagnosis of SARS-CoV-2 by  FDA under an Emergency Use Authorization (EUA). This EUA will remain  in effect (meaning this test can be used) for the duration of the  Covid-19 declaration under Section 564(b)(1) of the Act, 21  U.S.C. section 360bbb-3(b)(1), unless the authorization is  terminated or revoked. Performed at [facility], [address] [place], [place] [address]     Radiology Reports CT ANGIO CHEST PE W OR WO CONTRAST  Result Date: 11/24/2019 CLINICAL DATA:  Shortness of breath EXAM: CT ANGIOGRAPHY CHEST WITH CONTRAST TECHNIQUE: Multidetector CT imaging of the chest was performed using the standard protocol during bolus administration of intravenous contrast. Multiplanar CT image reconstructions and MIPs were obtained to evaluate the vascular anatomy. CONTRAST:  98mL OMNIPAQUE IOHEXOL 350 MG/ML SOLN COMPARISON:  11/01/2017 FINDINGS: Cardiovascular: Heart is upper limits normal in size. Aorta is normal caliber. Densely calcified coronary arteries versus coronary artery stents. This is new since prior study. No filling defects in the pulmonary arteries to suggest pulmonary emboli. Mediastinum/Nodes: No mediastinal, hilar, or axillary  adenopathy. Lungs/Pleura: Extensive diffuse bilateral airspace disease, most confluent in the lower lungs. This could reflect edema or infection. Small bilateral effusions. Upper Abdomen: Imaging into the upper abdomen shows no acute findings. Musculoskeletal: Chest wall soft tissues are unremarkable. No acute bony abnormality. Review of the MIP images confirms the above findings. IMPRESSION: Extensive bilateral airspace disease with lower lobe predominance and with small effusions. Findings could reflect edema or infection. Borderline cardiomegaly. Extensive coronary artery calcifications versus stents, new since prior study. No evidence of pulmonary embolus. Electronically Signed   By: Rolm Baptise M.D.   On: 11/24/2019 19:18   US Venous Img Lower Bilateral (DVT)  Result Date: 11/24/2019 CLINICAL DATA:  Bilateral lower extremity pain and edema. Evaluate for DVT. EXAM: BILATERAL LOWER EXTREMITY VENOUS DOPPLER ULTRASOUND TECHNIQUE: Gray-scale sonography with graded compression, as well as color Doppler and duplex ultrasound were performed to evaluate the lower extremity deep venous systems from the level of the common femoral vein and including the common femoral, femoral, profunda femoral, popliteal and calf veins including the posterior tibial, peroneal and gastrocnemius veins when visible. The superficial great saphenous vein was also interrogated. Spectral Doppler was utilized to evaluate flow at rest and with distal augmentation maneuvers in the common femoral, femoral and popliteal veins. COMPARISON:  None. FINDINGS: Examination is degraded due to patient body habitus and poor sonographic window RIGHT LOWER EXTREMITY Common Femoral Vein: No evidence of thrombus. Normal compressibility, respiratory phasicity and response to augmentation. Saphenofemoral Junction: No evidence of thrombus. Normal compressibility and flow on color Doppler imaging. Profunda Femoral Vein: No evidence of thrombus. Normal  compressibility and flow on color Doppler imaging. Femoral Vein: No evidence of thrombus. Normal compressibility, respiratory phasicity and response to augmentation. Popliteal Vein: No evidence of thrombus. Normal compressibility, respiratory phasicity and response to augmentation. Calf Veins: No evidence of thrombus. Normal compressibility and flow on color Doppler imaging. Superficial Great Saphenous Vein: No evidence of thrombus. Normal compressibility. Venous Reflux:  None. Other Findings:  None. LEFT LOWER EXTREMITY Common Femoral Vein: No evidence of thrombus. Normal compressibility, respiratory phasicity and response to augmentation. Saphenofemoral Junction: No evidence of thrombus. Normal compressibility and flow on color Doppler imaging. Profunda Femoral Vein: No evidence of thrombus. Normal compressibility and flow on color Doppler imaging. Femoral Vein: No evidence of thrombus. Normal compressibility, respiratory phasicity and response to augmentation. Popliteal Vein: No evidence of thrombus. Normal compressibility, respiratory phasicity and response to augmentation. Calf Veins: No evidence of thrombus. Normal compressibility and flow on color Doppler imaging. Superficial Great Saphenous Vein: No evidence of thrombus. Normal compressibility. Venous Reflux:  None. Other Findings:  None. IMPRESSION: No evidence of DVT within either lower extremity. Electronically Signed   By: Sandi Mariscal M.D.   On: 11/24/2019 08:49   DG Chest Portable 1 View  Result Date: 11/24/2019 CLINICAL DATA:  Shortness of breath EXAM: PORTABLE CHEST 1 VIEW COMPARISON:  07/21/2019 FINDINGS: Extensive reticulonodular airspace disease, worsening since prior study. More confluent opacity noted at the right lung base which could reflect pneumonia. Heart is normal size. No effusions. No acute bony abnormality. IMPRESSION: Worsening diffuse reticulonodular airspace disease throughout the lungs. Confluent right lower lobe airspace opacity  could reflect pneumonia. Electronically Signed   By: Rolm Baptise M.D.   On: 11/24/2019 03:08   ECHOCARDIOGRAM COMPLETE  Result Date: 11/24/2019    ECHOCARDIOGRAM REPORT   Patient Name:   KARLINA BANKEN Date of Exam: 11/24/2019 Medical Rec #:  [id number]  Height:       62.0 in Accession #:    [id number]         Weight:       296.0 lb Date of Birth:  [birth year]           BSA:          2.258 m Patient Age:    33 years           BP:           151/93 mmHg Patient Gender: F                  HR:           96 bpm. Exam Location:  [facility] Procedure: 2D Echo, Color Doppler and Cardiac Doppler Indications:     CHF-acute systolic [id number]  History:         Patient has prior history of Echocardiogram examinations, most                  recent 11/02/2017. Risk Factors:Hypertension.  Sonographer:     Sherrie Sport RDCS (AE) Referring Phys:  [id number] Athena Masse Diagnosing Phys: Nelva Bush MD IMPRESSIONS  1. Left ventricular ejection fraction, by estimation, is 55 to 60%. The left ventricle has normal function. The left ventricle has no regional wall motion abnormalities. There is mild left ventricular hypertrophy. Left ventricular diastolic parameters are consistent with Grade II diastolic dysfunction (pseudonormalization). Elevated left atrial pressure.  2. Right ventricular systolic function is normal. The right ventricular size is normal.  3. The mitral valve is normal in structure. No evidence of mitral valve regurgitation.  4. The aortic valve was not well visualized. Aortic valve regurgitation is not visualized. No aortic stenosis is present. Comparison(s): A prior study was performed on 11/02/2017. LVEF has significantly improved. FINDINGS  Left Ventricle: Left ventricular ejection fraction, by estimation, is 55 to 60%. The left ventricle has normal function. The left ventricle has no regional wall motion abnormalities. The left ventricular internal cavity size was normal in size. There is  mild left ventricular  hypertrophy. Left ventricular diastolic parameters are consistent with Grade II diastolic dysfunction (pseudonormalization). Elevated left atrial pressure. Right Ventricle: The right ventricular size is normal. No increase in right ventricular wall thickness. Right ventricular systolic function is normal. Left Atrium: Left atrial size was normal in size. Right Atrium: Right atrial size was normal in size. Pericardium: The pericardium was not well visualized. Mitral Valve: The mitral valve is normal in structure. No evidence of mitral valve regurgitation. Tricuspid Valve: The tricuspid valve is not well visualized. Tricuspid valve regurgitation is trivial. Aortic Valve: The aortic valve was not well visualized. Aortic valve regurgitation is not visualized. No aortic stenosis is present. Aortic valve mean gradient measures 5.5 mmHg. Aortic valve peak gradient measures 8.6 mmHg. Aortic valve area, by VTI measures 2.16 cm. Pulmonic Valve: The pulmonic valve was not well visualized. Pulmonic valve regurgitation is not visualized. No evidence of pulmonic stenosis. Aorta: The aortic root is normal in size and structure. Pulmonary Artery: The pulmonary artery is not well seen. Venous: The inferior vena cava was not well visualized. IAS/Shunts: The interatrial septum was not well visualized.  LEFT VENTRICLE PLAX 2D LVIDd:         4.52 cm  Diastology LVIDs:         2.93 cm  LV e' lateral:   6.42 cm/s LV PW:         1.10 cm  LV E/e' lateral: 19.3 LV IVS:        1.02 cm  LV e' medial:    6.64 cm/s LVOT diam:     2.00 cm  LV E/e' medial:  18.7 LV SV:         64 LV SV Index:   29 LVOT Area:     3.14 cm  RIGHT VENTRICLE RV Basal diam:  3.14 cm RV S prime:     15.10 cm/s TAPSE (M-mode): 3.3 cm LEFT ATRIUM           Index       RIGHT ATRIUM           Index LA diam:      4.20 cm 1.86 cm/m  RA Area:     11.30 cm LA Vol (A2C): 68.8 ml 30.47 ml/m RA Volume:   24.70 ml  10.94 ml/m LA Vol (A4C): 32.7 ml 14.48 ml/m  AORTIC VALVE                     PULMONIC VALVE AV Area (Vmax):    2.33 cm     PV Vmax:        0.51 m/s AV Area (Vmean):   2.41 cm     PV Peak grad:   1.1 mmHg AV Area (VTI):     2.16 cm     RVOT Peak grad: 5 mmHg AV Vmax:           147.00 cm/s AV Vmean:          111.500 cm/s AV VTI:            0.298 m AV Peak Grad:      8.6 mmHg AV Mean Grad:      5.5 mmHg LVOT Vmax:         109.00 cm/s LVOT Vmean:        85.400 cm/s LVOT VTI:          0.205 m LVOT/AV VTI ratio: 0.69  AORTA Ao Root diam: 2.40 cm MITRAL VALVE                TRICUSPID VALVE MV Area (PHT): 2.56 cm     TR Peak grad:   8.6 mmHg MV Decel Time: 296 msec     TR Vmax:        147.00 cm/s MV E velocity: 124.00 cm/s MV A velocity: 75.20 cm/s   SHUNTS MV E/A ratio:  1.65         Systemic VTI:  0.20 m                             Systemic Diam: 2.00 cm Nelva Bush MD Electronically signed by Nelva Bush MD Signature Date/Time: 11/24/2019/1:04:51 PM    Final    US OB LESS THAN 14 WEEKS WITH OB TRANSVAGINAL  Result Date: 11/17/2019 CLINICAL DATA:  Unsure of LMP. First trimester pregnancy with inconclusive fetal viability. EXAM: OBSTETRIC <14 WK [place] AND TRANSVAGINAL OB US TECHNIQUE: Both transabdominal and transvaginal ultrasound examinations were performed for complete evaluation of the gestation as well as the maternal uterus, adnexal regions, and pelvic cul-de-sac. Transvaginal technique was performed to assess early pregnancy. COMPARISON:  None. FINDINGS: Intrauterine gestational sac: Single Yolk sac:  Visualized. Embryo:  Visualized. Cardiac Activity: Visualized. Heart Rate: 98 bpm CRL:  2 mm   5 w   5 d  [place] EDC: 07/14/2020 Subchorionic hemorrhage:  None visualized. Maternal uterus/adnexae: Both ovaries are normal in appearance. No adnexal mass identified. Small amount of simple free fluid noted. IMPRESSION: Single living IUP with estimated gestational age of [redacted] weeks 5 days, and [place] EDC of 07/14/2020. Electronically Signed   By: Marlaine Hind M.D.    On: 11/17/2019 16:19   SLEEP STUDY DOCUMENTS  Result Date: 10/30/2019 Ordered by an unspecified provider.   Time Spent in minutes 35   Jalisa Sacco M.D on 11/25/2019 at 2:42 PM  Between 7am to 7pm - Pager - [phone]  After 7pm go to [email or web address] - password [facility]  [facility] -  Office  [phone]

## 2019-11-25 NOTE — ED Notes (Signed)
Up to bedside commode with assistance.

## 2019-11-25 NOTE — Progress Notes (Signed)
Progress Note  Patient Name: Lauren Delgado Date of Encounter: 11/25/2019  Primary Cardiologist: Pixie Casino, MD  Subjective   No c/p or dyspnea this AM.  BP's improved but still on IV ntg @ 60 mcg/min.  Inpatient Medications    Scheduled Meds: . furosemide  40 mg Intravenous Q12H  . heparin  5,000 Units Subcutaneous Q8H  . insulin aspart  0-20 Units Subcutaneous TID WC  . insulin aspart  0-5 Units Subcutaneous QHS  . insulin aspart  5 Units Subcutaneous TID WC  . insulin glargine  12 Units Subcutaneous Daily  . labetalol  200 mg Oral BID  . sodium chloride flush  3 mL Intravenous Q12H   Continuous Infusions: . sodium chloride    . nitroGLYCERIN 60 mcg/min (11/25/19 0940)   PRN Meds: sodium chloride, acetaminophen, ondansetron (ZOFRAN) IV, sodium chloride flush   Vital Signs    Vitals:   11/25/19 0730 11/25/19 0800 11/25/19 0830 11/25/19 0900  BP: 123/66 136/67 131/71 131/63  Pulse: 88 90 83 81  Resp: (!) 26 17 20 18   Temp:      SpO2: 95% 98% 98% 97%  Weight:      Height:       No intake or output data in the 24 hours ending 11/25/19 1027 Filed Weights   11/24/19 0232  Weight: 134.3 kg    Physical Exam   GEN: obese, in no acute distress.  HEENT: Grossly normal.  Neck: Supple, obese, difficult to gauge jvp, no carotid bruits, or masses. Cardiac: RRR, no murmurs, rubs, or gallops. No clubbing, cyanosis, edema.  Radials/DP/PT 2+ and equal bilaterally.  Respiratory:  Respirations regular and unlabored, bibasilar crackles. GI: Obese, soft, nontender, nondistended, BS + x 4. MS: no deformity or atrophy. Skin: warm and dry, no rash. Neuro:  Strength and sensation are intact. Psych: AAOx3.  Normal affect.  Labs    Chemistry Recent Labs  Lab 11/24/19 0245 11/25/19 0506  NA 135 135  K 3.7 3.5  CL 104 103  CO2 19* 23  GLUCOSE 289* 272*  BUN 15 17  CREATININE 0.80 0.81  CALCIUM 8.5* 8.1*  PROT 7.3  --   ALBUMIN 3.3*  --   AST 21  --   ALT  18  --   ALKPHOS 65  --   BILITOT 0.9  --   GFRNONAA >60 >60  GFRAA >60 >60  ANIONGAP 12 9     Hematology Recent Labs  Lab 11/24/19 0245 11/25/19 0506  WBC 14.2* 9.5  RBC 3.50* 2.89*  HGB 10.3* 8.6*  HCT 31.9* 26.1*  MCV 91.1 90.3  MCH 29.4 29.8  MCHC 32.3 33.0  RDW 13.1 13.1  PLT 302 258    Cardiac Enzymes  Recent Labs  Lab 11/24/19 0245 11/24/19 0415  TROPONINIHS 8 10      BNP Recent Labs  Lab 11/24/19 0245  BNP 123.0*      Radiology    CT ANGIO CHEST PE W OR WO CONTRAST  Result Date: 11/24/2019 CLINICAL DATA:  Shortness of breath EXAM: CT ANGIOGRAPHY CHEST WITH CONTRAST TECHNIQUE: Multidetector CT imaging of the chest was performed using the standard protocol during bolus administration of intravenous contrast. Multiplanar CT image reconstructions and MIPs were obtained to evaluate the vascular anatomy. CONTRAST:  68mL OMNIPAQUE IOHEXOL 350 MG/ML SOLN COMPARISON:  11/01/2017 FINDINGS: Cardiovascular: Heart is upper limits normal in size. Aorta is normal caliber. Densely calcified coronary arteries versus coronary artery stents. This is new since  prior study. No filling defects in the pulmonary arteries to suggest pulmonary emboli. Mediastinum/Nodes: No mediastinal, hilar, or axillary adenopathy. Lungs/Pleura: Extensive diffuse bilateral airspace disease, most confluent in the lower lungs. This could reflect edema or infection. Small bilateral effusions. Upper Abdomen: Imaging into the upper abdomen shows no acute findings. Musculoskeletal: Chest wall soft tissues are unremarkable. No acute bony abnormality. Review of the MIP images confirms the above findings. IMPRESSION: Extensive bilateral airspace disease with lower lobe predominance and with small effusions. Findings could reflect edema or infection. Borderline cardiomegaly. Extensive coronary artery calcifications versus stents, new since prior study. No evidence of pulmonary embolus. Electronically Signed   By:  Rolm Baptise M.D.   On: 11/24/2019 19:18   US Venous Img Lower Bilateral (DVT)  Result Date: 11/24/2019 CLINICAL DATA:  Bilateral lower extremity pain and edema. Evaluate for DVT. EXAM: BILATERAL LOWER EXTREMITY VENOUS DOPPLER ULTRASOUND TECHNIQUE: Gray-scale sonography with graded compression, as well as color Doppler and duplex ultrasound were performed to evaluate the lower extremity deep venous systems from the level of the common femoral vein and including the common femoral, femoral, profunda femoral, popliteal and calf veins including the posterior tibial, peroneal and gastrocnemius veins when visible. The superficial great saphenous vein was also interrogated. Spectral Doppler was utilized to evaluate flow at rest and with distal augmentation maneuvers in the common femoral, femoral and popliteal veins. COMPARISON:  None. FINDINGS: Examination is degraded due to patient body habitus and poor sonographic window RIGHT LOWER EXTREMITY Common Femoral Vein: No evidence of thrombus. Normal compressibility, respiratory phasicity and response to augmentation. Saphenofemoral Junction: No evidence of thrombus. Normal compressibility and flow on color Doppler imaging. Profunda Femoral Vein: No evidence of thrombus. Normal compressibility and flow on color Doppler imaging. Femoral Vein: No evidence of thrombus. Normal compressibility, respiratory phasicity and response to augmentation. Popliteal Vein: No evidence of thrombus. Normal compressibility, respiratory phasicity and response to augmentation. Calf Veins: No evidence of thrombus. Normal compressibility and flow on color Doppler imaging. Superficial Great Saphenous Vein: No evidence of thrombus. Normal compressibility. Venous Reflux:  None. Other Findings:  None. LEFT LOWER EXTREMITY Common Femoral Vein: No evidence of thrombus. Normal compressibility, respiratory phasicity and response to augmentation. Saphenofemoral Junction: No evidence of thrombus. Normal  compressibility and flow on color Doppler imaging. Profunda Femoral Vein: No evidence of thrombus. Normal compressibility and flow on color Doppler imaging. Femoral Vein: No evidence of thrombus. Normal compressibility, respiratory phasicity and response to augmentation. Popliteal Vein: No evidence of thrombus. Normal compressibility, respiratory phasicity and response to augmentation. Calf Veins: No evidence of thrombus. Normal compressibility and flow on color Doppler imaging. Superficial Great Saphenous Vein: No evidence of thrombus. Normal compressibility. Venous Reflux:  None. Other Findings:  None. IMPRESSION: No evidence of DVT within either lower extremity. Electronically Signed   By: Sandi Mariscal M.D.   On: 11/24/2019 08:49   DG Chest Portable 1 View  Result Date: 11/24/2019 CLINICAL DATA:  Shortness of breath EXAM: PORTABLE CHEST 1 VIEW COMPARISON:  07/21/2019 FINDINGS: Extensive reticulonodular airspace disease, worsening since prior study. More confluent opacity noted at the right lung base which could reflect pneumonia. Heart is normal size. No effusions. No acute bony abnormality. IMPRESSION: Worsening diffuse reticulonodular airspace disease throughout the lungs. Confluent right lower lobe airspace opacity could reflect pneumonia. Electronically Signed   By: Rolm Baptise M.D.   On: 11/24/2019 03:08   Telemetry    RSR - Personally Reviewed  Cardiac Studies   2D Echocardiogram 3..23.2021  IMPRESSIONS   1. Left ventricular ejection fraction, by estimation, is 55 to 60%. The  left ventricle has normal function. The left ventricle has no regional  wall motion abnormalities. There is mild left ventricular hypertrophy.  Left ventricular diastolic parameters  are consistent with Grade II diastolic dysfunction (pseudonormalization).  Elevated left atrial pressure.   2. Right ventricular systolic function is normal. The right ventricular  size is normal.   3. The mitral valve is normal in  structure. No evidence of mitral valve  regurgitation.   4. The aortic valve was not well visualized. Aortic valve regurgitation  is not visualized. No aortic stenosis is present.  _____________   Patient Profile     35 year old woman with history of multivessel CAD status post PCI in 11/2017, chronic HFrEF secondary to ischemic cardiomyopathy, poorly controlled diabetes mellitus, hypertension, hyperlipidemia, and pregnancy ([redacted]w[redacted]d), who presented 3/23 w/ acute dyspnea, pulm edema, and ? Of PNA on cxr/CT.  Assessment & Plan    1.  Acute pulmonary edema/Acute diastolic CHF:  H/o HFrEF w/ EF of 35-40% by echo in 11/2017, presented 3/23 w/ sudden onset of dyspnea and hypoxia (78% RA) w/ SBP up to 200 mmHg.  CXR and CTA w/ edema vs infection.  CTA neg for PE.  Echo this admission w/ nl LVEF and gr2 DD. Though I/O inaccurate, pt says that she has had good UO on IV lasix w/ improved breathing.  She cont to have crackles on exam. No edema.  Cont IV lasix today. Resume home hydralazine and plan to add back PM dose of nifedipine. Was using HCTZ @ home but seems likely that she will cont to have edema @ home in setting of pregnancy and may require oral lasix instead.   2.  Hypertensive Urgency:  In setting of above.  BP better on labetalol and IV ntg @ 60 mcgs.  Wean IV ntg.  Resume hydralazine/nifedipine.  BPs recently trending high 130's to 150 @ outpt visits.  She noted significant improvement in BPs after moving nifedipine to PM.  Was only taking hydralazine BID - ? Role of rebound hypertension in current admission.  Will resume @ TID.  She reports compliance w/ meds @ home but has had some dietary indiscretion.  Discussed importance of limiting Na intake/processed foods.  3.  CAD:  No c/p.  HsTrop nl despite acute edema/hypoxia.  EF nl by echo.  All reassuring.  Cont  blocker, wean IV ntg and resume oral nitrate.  Resume ASA.  No statin in setting of pregnancy.  4.  Abnl CXR/CT: ? Of infectious process vs  edema.  Leukocytosis resolved.  Afebrile.  COVID neg. F/u cxr in setting of diuresis.  5.  DMII:  Insulin per IM.  6.  Normocytic anemia:  H/h down this AM.  No recent bleeding.  Likely 2/2 blood draws.  Follow.  Signed, Murray Hodgkins, NP  11/25/2019, 10:27 AM    For questions or updates, please contact   Please consult [email or web address] for contact info under Cardiology/STEMI.

## 2019-11-26 ENCOUNTER — Other Ambulatory Visit: Payer: Medicaid Other

## 2019-11-26 ENCOUNTER — Encounter: Payer: Self-pay | Admitting: Internal Medicine

## 2019-11-26 DIAGNOSIS — I110 Hypertensive heart disease with heart failure: Secondary | ICD-10-CM

## 2019-11-26 DIAGNOSIS — E1165 Type 2 diabetes mellitus with hyperglycemia: Secondary | ICD-10-CM

## 2019-11-26 DIAGNOSIS — I429 Cardiomyopathy, unspecified: Secondary | ICD-10-CM

## 2019-11-26 DIAGNOSIS — Z3A01 Less than 8 weeks gestation of pregnancy: Secondary | ICD-10-CM

## 2019-11-26 DIAGNOSIS — I25118 Atherosclerotic heart disease of native coronary artery with other forms of angina pectoris: Secondary | ICD-10-CM

## 2019-11-26 DIAGNOSIS — I5033 Acute on chronic diastolic (congestive) heart failure: Secondary | ICD-10-CM | POA: Diagnosis present

## 2019-11-26 DIAGNOSIS — I169 Hypertensive crisis, unspecified: Secondary | ICD-10-CM | POA: Diagnosis present

## 2019-11-26 LAB — BASIC METABOLIC PANEL
Anion gap: 9 (ref 5–15)
BUN: 16 mg/dL (ref 6–20)
CO2: 22 mmol/L (ref 22–32)
Calcium: 8.3 mg/dL — ABNORMAL LOW (ref 8.9–10.3)
Chloride: 104 mmol/L (ref 98–111)
Creatinine, Ser: 0.65 mg/dL (ref 0.44–1.00)
GFR calc Af Amer: >60 mL/min (ref >60)
GFR calc non Af Amer: >60 mL/min (ref >60)
Glucose, Bld: 206 mg/dL — ABNORMAL HIGH (ref 70–99)
Potassium: 3.3 mmol/L — ABNORMAL LOW (ref 3.5–5.1)
Sodium: 135 mmol/L (ref 135–145)

## 2019-11-26 LAB — GLUCOSE, CAPILLARY: Glucose-Capillary: 207 mg/dL — ABNORMAL HIGH (ref 70–99)

## 2019-11-26 MED ORDER — INSULIN ASPART 100 UNIT/ML ~~LOC~~ SOLN
9.0000 [IU] | Freq: Three times a day (TID) | SUBCUTANEOUS | Status: DC
  Administered 2019-11-26: 9 [IU] via SUBCUTANEOUS
  Filled 2019-11-26: qty 1

## 2019-11-26 MED ORDER — HYDRALAZINE HCL 50 MG PO TABS
50.0000 mg | ORAL_TABLET | Freq: Three times a day (TID) | ORAL | Status: DC
  Administered 2019-11-26: 50 mg via ORAL
  Filled 2019-11-26: qty 1

## 2019-11-26 MED ORDER — FUROSEMIDE 20 MG PO TABS: 20.0000 mg | ORAL_TABLET | Freq: Every day | ORAL | 0 refills | Status: DC

## 2019-11-26 MED ORDER — HYDRALAZINE HCL 25 MG PO TABS: 50.0000 mg | ORAL_TABLET | Freq: Three times a day (TID) | ORAL | 0 refills | Status: DC

## 2019-11-26 MED ORDER — POTASSIUM CHLORIDE CRYS ER 20 MEQ PO TBCR
40.0000 meq | EXTENDED_RELEASE_TABLET | Freq: Once | ORAL | Status: AC
  Administered 2019-11-26: 40 meq via ORAL
  Filled 2019-11-26: qty 4

## 2019-11-26 MED ORDER — INSULIN GLARGINE 100 UNIT/ML ~~LOC~~ SOLN: 35.0000 [IU] | SUBCUTANEOUS | 11 refills | Status: DC

## 2019-11-26 MED ORDER — INSULIN GLARGINE 100 UNIT/ML ~~LOC~~ SOLN
35.0000 [IU] | Freq: Every day | SUBCUTANEOUS | Status: DC
  Filled 2019-11-26: qty 0.35

## 2019-11-26 NOTE — Progress Notes (Signed)
ReDS Pro not appropriate for this pt due to BMI of 53.

## 2019-11-26 NOTE — Progress Notes (Signed)
[name] to be D/C'd Home per MD order.  Discussed prescriptions and follow up appointments with the patient. Prescriptions given to patient, medication list explained in detail. Pt verbalized understanding.  Allergies as of 11/26/2019   No Known Allergies     Medication List    STOP taking these medications   hydrochlorothiazide 12.5 MG capsule Commonly known as: MICROZIDE     TAKE these medications   aspirin 81 MG chewable tablet Chew 1 tablet (81 mg total) by mouth daily.   Blood Pressure Monitoring Devi 1 Device by Does not apply route once a week.   Doxylamine-Pyridoxine 10-10 MG Tbec Commonly known as: Diclegis Take 20 mg by mouth 3 (three) times daily as needed.   furosemide 20 MG tablet Commonly known as: Lasix Take 1 tablet (20 mg total) by mouth daily.   hydrALAZINE 25 MG tablet Commonly known as: APRESOLINE Take 2 tablets (50 mg total) by mouth 3 (three) times daily. What changed:   how much to take  when to take this   insulin aspart 100 UNIT/ML injection Commonly known as: novoLOG Inject 0-20 Units into the skin 3 (three) times daily with meals. cbg70-120:0, cbg121-150:3, cbg151-200:4, cbg200-250:7, cbg251-300:11, cbg301-350:15, cbg351-400:20, cbg>400 call your doctor   insulin glargine 100 UNIT/ML injection Commonly known as: LANTUS Inject 0.35 mLs (35 Units total) into the skin every morning. What changed: how much to take   isosorbide mononitrate 60 MG 24 hr tablet Commonly known as: IMDUR Take 1.5 tablets (90 mg total) by mouth daily.   labetalol 200 MG tablet Commonly known as: NORMODYNE Take 1 tablet (200 mg total) by mouth 3 (three) times daily.   MULTI ADULT GUMMIES PO Take 2 tablets by mouth daily.   NIFEdipine 60 MG 24 hr tablet Commonly known as: PROCARDIA XL/NIFEDICAL XL Take 1 tablet (60 mg total) by mouth at bedtime.   nitroGLYCERIN 0.4 MG SL tablet Commonly known as: NITROSTAT Place 1 tablet (0.4 mg total) under the  tongue every 5 (five) minutes as needed for chest pain.   valACYclovir 500 MG tablet Commonly known as: Valtrex Take 1 tablet (500 mg total) by mouth as needed.       Vitals:   11/26/19 0401 11/26/19 0744  BP: 131/72 (!) 143/81  Pulse: 97 97  Resp:  19  Temp: 99.1 F (37.3 C) 98.5 F (36.9 C)  SpO2: 95% 98%    Tele box removed and returned.Skin clean, dry and intact without evidence of skin break down, no evidence of skin tears noted. IV catheter discontinued intact. Site without signs and symptoms of complications. Dressing and pressure applied. Pt denies pain at this time. No complaints noted.  An After Visit Summary was printed and given to the patient. Patient escorted via [place], and D/C home via private auto.  Rolley Sims

## 2019-11-26 NOTE — Discharge Summary (Addendum)
Physician Discharge Summary  Lauren Delgado [id number] DOB: 1986-04-24 DOA: 11/24/2019  PCP: Guadalupe Dawn, MD  Admit date: 11/24/2019 Discharge date: 11/26/2019  Admitted From: Home Disposition: Home  Recommendations for Outpatient Follow-up:  1. Follow-up with PCP and OB in 1-2 weeks  Home Health: None Equipment/Devices:: None  Discharge Condition: Fair CODE STATUS: Full code Diet recommendation: Heart Healthy / Carb Modified      Discharge Diagnoses:  Principal Problem:   Acute on chronic diastolic CHF (congestive heart failure) (HCC)   Active Problems:  Hyperglycemia due to type 2 diabetes mellitus (HCC)   Acute respiratory failure with hypoxia (HCC)   Hypertensive crisis   Acute pulmonary edema (HCC)   Morbid obesity ([place])   Cardiomyopathy ([place])   Status post coronary artery stent placement   Coronary artery disease   Pregnant    Brief narrative/HPI 34 year old morbidly obese female who is [redacted] weeks pregnant, history of cardiomyopathy with EF of 35-40% presented with acute onset of dyspnea and was hypoxic in the 14s along with accelerated hypertension with systolic blood pressure up to 200 mmHg. CT angiogram of the chest done in ED negative for PE but showed acute pulmonary edema. Received IV Lasix with improvement and placed on nitroglycerin drip for accelerated hypertension. Cardiology consulted and admitted to hospital service.   Hospital course   Principal Problem:   Acute on chronic diastolic CHF (congestive heart failure) ([place]) 2D echo done this admission shows normal LVEF and grade 2 diastolic dysfunction. CT angiogram negative for PE.    Diuresed quite well with IV Lasix twice daily.  Symptoms resolved.  Heart failure Home instructions provided. Patient will be discharged on low-dose oral Lasix (20 mg daily). Patient's home dose hydralazine increased to 50 mg every 8 hours (was taking 25 mg every 12 hours). Continue labetalol.  Cardiology  consult appreciated.   Active Problems: Accelerated hypertension/hypertensive urgency Likely secondary to acute pulmonary edema.  Required IV nitroglycerin on admission for better blood pressure control. Resumed labetalol.  Hydralazine dose increased to 50 mg every 8 hours (was only taking 25 mg twice daily). Nifedipine resumed as well.  HCTZ discontinued since patient will be now discharged on low-dose Lasix and hydralazine dose has been increased.  Blood pressure improved.  Patient reports taking a lot of processed food and instructed on avoiding them as well as maintain salt restriction and diet.  OB consult appreciated while in the hospital.  Has outpatient follow-up.   CAD with history of coronary artery stent. Asymptomatic.  Continue aspirin and labetalol.  EF improved on repeat echo.  Diabetes mellitus type 2, uncontrolled CBG uncontrolled.  A1c of 9.1.  On glipizide which was recently stopped and patient started on Lantus 20 units daily with NovoLog corrective scale.  CBGs in 200s.  Lantus dose increased to 35 units daily along with Premeal aspart.  Patient will be discharged on Lantus of 35 units daily and continue her NovoLog corrective scale.  Instructed to keep a log of her blood glucose reading to show it to her OB. She has appointment with her outpatient diabetic educator this afternoon.  OB consult recommends goal for fasting blood glucose to be >95 and postprandial <140.  Antenatal care Resume prenatal vitamin.  OB consult appreciated and patient instructed to maintain her outpatient follow-up.    Family Communication  :  Discussed with mother during hospital stay.  Disposition Plan  :  Home  Consults  : Cardiology  Procedures  : 2D echo, CT angiogram chest  Discharge Instructions  Discharge Instructions    AMB referral to CHF clinic   Complete by: As directed    AMB referral to pulmonary rehabilitation   Complete by: As directed    Please  select a program: Respiratory Care Services   Respiratory Care Services Diagnosis: Heart Failure   After initial evaluation and assessments completed: Virtual Based Care may be provided alone or in conjunction with Pulmonary Rehab/Respiratory Care services based on patient barriers.: Yes     Allergies as of 11/26/2019   No Known Allergies     Medication List    STOP taking these medications   hydrochlorothiazide 12.5 MG capsule Commonly known as: MICROZIDE     TAKE these medications   aspirin 81 MG chewable tablet Chew 1 tablet (81 mg total) by mouth daily.   Blood Pressure Monitoring Devi 1 Device by Does not apply route once a week.   Doxylamine-Pyridoxine 10-10 MG Tbec Commonly known as: Diclegis Take 20 mg by mouth 3 (three) times daily as needed.   furosemide 20 MG tablet Commonly known as: Lasix Take 1 tablet (20 mg total) by mouth daily.   hydrALAZINE 25 MG tablet Commonly known as: APRESOLINE Take 2 tablets (50 mg total) by mouth 3 (three) times daily. What changed:   how much to take  when to take this   insulin aspart 100 UNIT/ML injection Commonly known as: novoLOG Inject 0-20 Units into the skin 3 (three) times daily with meals. cbg70-120:0, cbg121-150:3, cbg151-200:4, cbg200-250:7, cbg251-300:11, cbg301-350:15, cbg351-400:20, cbg>400 call your doctor   insulin glargine 100 UNIT/ML injection Commonly known as: LANTUS Inject 0.35 mLs (35 Units total) into the skin every morning. What changed: how much to take   isosorbide mononitrate 60 MG 24 hr tablet Commonly known as: IMDUR Take 1.5 tablets (90 mg total) by mouth daily.   labetalol 200 MG tablet Commonly known as: NORMODYNE Take 1 tablet (200 mg total) by mouth 3 (three) times daily.   MULTI ADULT GUMMIES PO Take 2 tablets by mouth daily.   NIFEdipine 60 MG 24 hr tablet Commonly known as: PROCARDIA XL/NIFEDICAL XL Take 1 tablet (60 mg total) by mouth at bedtime.   nitroGLYCERIN 0.4 MG SL  tablet Commonly known as: NITROSTAT Place 1 tablet (0.4 mg total) under the tongue every 5 (five) minutes as needed for chest pain.   valACYclovir 500 MG tablet Commonly known as: Valtrex Take 1 tablet (500 mg total) by mouth as needed.      Follow-up Information    [place] Follow up on 12/07/2019.   Specialty: Cardiology Why: at 10:00am. Enter through the [place] entrance Contact information: [address] [place] [phone]       Guadalupe Dawn, MD Follow up in 2 week(s).   Specialty: Family Medicine Contact information: [id number] [place] [address] [phone]        Pixie Casino, MD .   Specialty: Cardiology Contact information: [address] [place] [address] [phone]          No Known Allergies    Procedures/Studies: CT ANGIO CHEST PE W OR WO CONTRAST  Result Date: 11/24/2019 CLINICAL DATA:  Shortness of breath EXAM: CT ANGIOGRAPHY CHEST WITH CONTRAST TECHNIQUE: Multidetector CT imaging of the chest was performed using the standard protocol during bolus administration of intravenous contrast. Multiplanar CT image reconstructions and MIPs were obtained to evaluate the vascular anatomy. CONTRAST:  66mL  OMNIPAQUE IOHEXOL 350 MG/ML SOLN COMPARISON:  11/01/2017 FINDINGS: Cardiovascular: Heart is upper limits normal in size. Aorta is normal caliber. Densely calcified coronary arteries versus coronary artery stents. This is new since prior study. No filling defects in the pulmonary arteries to suggest pulmonary emboli. Mediastinum/Nodes: No mediastinal, hilar, or axillary adenopathy. Lungs/Pleura: Extensive diffuse bilateral airspace disease, most confluent in the lower lungs. This could reflect edema or infection. Small bilateral effusions. Upper Abdomen: Imaging into the upper abdomen shows no acute findings. Musculoskeletal: Chest  wall soft tissues are unremarkable. No acute bony abnormality. Review of the MIP images confirms the above findings. IMPRESSION: Extensive bilateral airspace disease with lower lobe predominance and with small effusions. Findings could reflect edema or infection. Borderline cardiomegaly. Extensive coronary artery calcifications versus stents, new since prior study. No evidence of pulmonary embolus. Electronically Signed   By: Rolm Baptise M.D.   On: 11/24/2019 19:18   US Venous Img Lower Bilateral (DVT)  Result Date: 11/24/2019 CLINICAL DATA:  Bilateral lower extremity pain and edema. Evaluate for DVT. EXAM: BILATERAL LOWER EXTREMITY VENOUS DOPPLER ULTRASOUND TECHNIQUE: Gray-scale sonography with graded compression, as well as color Doppler and duplex ultrasound were performed to evaluate the lower extremity deep venous systems from the level of the common femoral vein and including the common femoral, femoral, profunda femoral, popliteal and calf veins including the posterior tibial, peroneal and gastrocnemius veins when visible. The superficial great saphenous vein was also interrogated. Spectral Doppler was utilized to evaluate flow at rest and with distal augmentation maneuvers in the common femoral, femoral and popliteal veins. COMPARISON:  None. FINDINGS: Examination is degraded due to patient body habitus and poor sonographic window RIGHT LOWER EXTREMITY Common Femoral Vein: No evidence of thrombus. Normal compressibility, respiratory phasicity and response to augmentation. Saphenofemoral Junction: No evidence of thrombus. Normal compressibility and flow on color Doppler imaging. Profunda Femoral Vein: No evidence of thrombus. Normal compressibility and flow on color Doppler imaging. Femoral Vein: No evidence of thrombus. Normal compressibility, respiratory phasicity and response to augmentation. Popliteal Vein: No evidence of thrombus. Normal compressibility, respiratory phasicity and response to  augmentation. Calf Veins: No evidence of thrombus. Normal compressibility and flow on color Doppler imaging. Superficial Great Saphenous Vein: No evidence of thrombus. Normal compressibility. Venous Reflux:  None. Other Findings:  None. LEFT LOWER EXTREMITY Common Femoral Vein: No evidence of thrombus. Normal compressibility, respiratory phasicity and response to augmentation. Saphenofemoral Junction: No evidence of thrombus. Normal compressibility and flow on color Doppler imaging. Profunda Femoral Vein: No evidence of thrombus. Normal compressibility and flow on color Doppler imaging. Femoral Vein: No evidence of thrombus. Normal compressibility, respiratory phasicity and response to augmentation. Popliteal Vein: No evidence of thrombus. Normal compressibility, respiratory phasicity and response to augmentation. Calf Veins: No evidence of thrombus. Normal compressibility and flow on color Doppler imaging. Superficial Great Saphenous Vein: No evidence of thrombus. Normal compressibility. Venous Reflux:  None. Other Findings:  None. IMPRESSION: No evidence of DVT within either lower extremity. Electronically Signed   By: Sandi Mariscal M.D.   On: 11/24/2019 08:49   DG Chest Portable 1 View  Result Date: 11/24/2019 CLINICAL DATA:  Shortness of breath EXAM: PORTABLE CHEST 1 VIEW COMPARISON:  07/21/2019 FINDINGS: Extensive reticulonodular airspace disease, worsening since prior study. More confluent opacity noted at the right lung base which could reflect pneumonia. Heart is normal size. No effusions. No acute bony abnormality. IMPRESSION: Worsening diffuse reticulonodular airspace disease throughout the lungs. Confluent right lower lobe airspace opacity could reflect pneumonia.  Electronically Signed   By: Rolm Baptise M.D.   On: 11/24/2019 03:08   ECHOCARDIOGRAM COMPLETE  Result Date: 11/24/2019    ECHOCARDIOGRAM REPORT   Patient Name:   TYKEYAH LATTNER Date of Exam: 11/24/2019 Medical Rec #:  [id number]           Height:       62.0 in Accession #:    [id number]         Weight:       296.0 lb Date of Birth:  01/14/1986           BSA:          2.258 m Patient Age:    34 years           BP:           151/93 mmHg Patient Gender: F                  HR:           96 bpm. Exam Location:  [facility] Procedure: 2D Echo, Color Doppler and Cardiac Doppler Indications:     CHF-acute systolic [id number]  History:         Patient has prior history of Echocardiogram examinations, most                  recent 11/02/2017. Risk Factors:Hypertension.  Sonographer:     Sherrie Sport RDCS (AE) Referring Phys:  [id number] Athena Masse Diagnosing Phys: Nelva Bush MD IMPRESSIONS  1. Left ventricular ejection fraction, by estimation, is 55 to 60%. The left ventricle has normal function. The left ventricle has no regional wall motion abnormalities. There is mild left ventricular hypertrophy. Left ventricular diastolic parameters are consistent with Grade II diastolic dysfunction (pseudonormalization). Elevated left atrial pressure.  2. Right ventricular systolic function is normal. The right ventricular size is normal.  3. The mitral valve is normal in structure. No evidence of mitral valve regurgitation.  4. The aortic valve was not well visualized. Aortic valve regurgitation is not visualized. No aortic stenosis is present. Comparison(s): A prior study was performed on 11/02/2017. LVEF has significantly improved. FINDINGS  Left Ventricle: Left ventricular ejection fraction, by estimation, is 55 to 60%. The left ventricle has normal function. The left ventricle has no regional wall motion abnormalities. The left ventricular internal cavity size was normal in size. There is  mild left ventricular hypertrophy. Left ventricular diastolic parameters are consistent with Grade II diastolic dysfunction (pseudonormalization). Elevated left atrial pressure. Right Ventricle: The right ventricular size is normal. No increase in right ventricular wall thickness. Right  ventricular systolic function is normal. Left Atrium: Left atrial size was normal in size. Right Atrium: Right atrial size was normal in size. Pericardium: The pericardium was not well visualized. Mitral Valve: The mitral valve is normal in structure. No evidence of mitral valve regurgitation. Tricuspid Valve: The tricuspid valve is not well visualized. Tricuspid valve regurgitation is trivial. Aortic Valve: The aortic valve was not well visualized. Aortic valve regurgitation is not visualized. No aortic stenosis is present. Aortic valve mean gradient measures 5.5 mmHg. Aortic valve peak gradient measures 8.6 mmHg. Aortic valve area, by VTI measures 2.16 cm. Pulmonic Valve: The pulmonic valve was not well visualized. Pulmonic valve regurgitation is not visualized. No evidence of pulmonic stenosis. Aorta: The aortic root is normal in size and structure. Pulmonary Artery: The pulmonary artery is not well seen. Venous: The inferior vena cava was not well visualized. IAS/Shunts: The interatrial  septum was not well visualized.  LEFT VENTRICLE PLAX 2D LVIDd:         4.52 cm  Diastology LVIDs:         2.93 cm  LV e' lateral:   6.42 cm/s LV PW:         1.10 cm  LV E/e' lateral: 19.3 LV IVS:        1.02 cm  LV e' medial:    6.64 cm/s LVOT diam:     2.00 cm  LV E/e' medial:  18.7 LV SV:         64 LV SV Index:   29 LVOT Area:     3.14 cm  RIGHT VENTRICLE RV Basal diam:  3.14 cm RV S prime:     15.10 cm/s TAPSE (M-mode): 3.3 cm LEFT ATRIUM           Index       RIGHT ATRIUM           Index LA diam:      4.20 cm 1.86 cm/m  RA Area:     11.30 cm LA Vol (A2C): 68.8 ml 30.47 ml/m RA Volume:   24.70 ml  10.94 ml/m LA Vol (A4C): 32.7 ml 14.48 ml/m  AORTIC VALVE                    PULMONIC VALVE AV Area (Vmax):    2.33 cm     PV Vmax:        0.51 m/s AV Area (Vmean):   2.41 cm     PV Peak grad:   1.1 mmHg AV Area (VTI):     2.16 cm     RVOT Peak grad: 5 mmHg AV Vmax:           147.00 cm/s AV Vmean:          111.500 cm/s AV  VTI:            0.298 m AV Peak Grad:      8.6 mmHg AV Mean Grad:      5.5 mmHg LVOT Vmax:         109.00 cm/s LVOT Vmean:        85.400 cm/s LVOT VTI:          0.205 m LVOT/AV VTI ratio: 0.69  AORTA Ao Root diam: 2.40 cm MITRAL VALVE                TRICUSPID VALVE MV Area (PHT): 2.56 cm     TR Peak grad:   8.6 mmHg MV Decel Time: 296 msec     TR Vmax:        147.00 cm/s MV E velocity: 124.00 cm/s MV A velocity: 75.20 cm/s   SHUNTS MV E/A ratio:  1.65         Systemic VTI:  0.20 m                             Systemic Diam: 2.00 cm Nelva Bush MD Electronically signed by Nelva Bush MD Signature Date/Time: 11/24/2019/1:04:51 PM    Final    US OB LESS THAN 14 WEEKS WITH OB TRANSVAGINAL  Result Date: 11/17/2019 CLINICAL DATA:  Unsure of LMP. First trimester pregnancy with inconclusive fetal viability. EXAM: OBSTETRIC <14 WK [place] AND TRANSVAGINAL OB US TECHNIQUE: Both transabdominal and transvaginal ultrasound examinations were performed for complete evaluation of the gestation as well as the  maternal uterus, adnexal regions, and pelvic cul-de-sac. Transvaginal technique was performed to assess early pregnancy. COMPARISON:  None. FINDINGS: Intrauterine gestational sac: Single Yolk sac:  Visualized. Embryo:  Visualized. Cardiac Activity: Visualized. Heart Rate: 98 bpm CRL:  2 mm   5 w   5 d                  [place] EDC: 07/14/2020 Subchorionic hemorrhage:  None visualized. Maternal uterus/adnexae: Both ovaries are normal in appearance. No adnexal mass identified. Small amount of simple free fluid noted. IMPRESSION: Single living IUP with estimated gestational age of [redacted] weeks 5 days, and [place] EDC of 07/14/2020. Electronically Signed   By: Marlaine Hind M.D.   On: 11/17/2019 16:19   SLEEP STUDY DOCUMENTS  Result Date: 10/30/2019 Ordered by an unspecified provider.   (Echo, Carotid, EGD, Colonoscopy, ERCP)    Subjective:   Discharge Exam: Vitals:   11/26/19 0401 11/26/19 0744  BP: 131/72 (!) 143/81  Pulse:  97 97  Resp:  19  Temp: 99.1 F (37.3 C) 98.5 F (36.9 C)  SpO2: 95% 98%   Vitals:   11/25/19 1928 11/25/19 2220 11/26/19 0401 11/26/19 0744  BP: 132/63 (!) 147/78 131/72 (!) 143/81  Pulse: 97 (!) 102 97 97  Resp:    19  Temp: 98.2 F (36.8 C)  99.1 F (37.3 C) 98.5 F (36.9 C)  TempSrc: Oral  Oral Oral  SpO2: 98% 99% 95% 98%  Weight:   133.4 kg   Height:        General: Middle-aged morbidly obese female not in distress HEENT: Moist mucosa, supple neck Chest: Clear to auscultation bilaterally CVs: Normal S1-S2 GI: Soft, nondistended, nontender Musculoskeletal: Warm, no edema   The results of significant diagnostics from this hospitalization (including imaging, microbiology, ancillary and laboratory) are listed below for reference.     Microbiology: Recent Results (from the past 240 hour(s))  Respiratory Panel by RT PCR (Flu A&B, Covid) - Nasopharyngeal Swab     Status: None   Collection Time: 11/24/19  4:15 AM   Specimen: Nasopharyngeal Swab  Result Value Ref Range Status   SARS Coronavirus 2 by RT PCR NEGATIVE NEGATIVE Final    Comment: (NOTE) SARS-CoV-2 target nucleic acids are NOT DETECTED. The SARS-CoV-2 RNA is generally detectable in upper respiratoy specimens during the acute phase of infection. The lowest concentration of SARS-CoV-2 viral copies this assay can detect is 131 copies/mL. A negative result does not preclude SARS-Cov-2 infection and should not be used as the sole basis for treatment or other patient management decisions. A negative result may occur with  improper specimen collection/handling, submission of specimen other than nasopharyngeal swab, presence of viral mutation(s) within the areas targeted by this assay, and inadequate number of viral copies (<131 copies/mL). A negative result must be combined with clinical observations, patient history, and epidemiological information. The expected result is Negative. Fact Sheet for Patients:   PinkCheek.be Fact Sheet for Healthcare Providers:  GravelBags.it This test is not yet ap proved or cleared by the [place] FDA and  has been authorized for detection and/or diagnosis of SARS-CoV-2 by FDA under an Emergency Use Authorization (EUA). This EUA will remain  in effect (meaning this test can be used) for the duration of the COVID-19 declaration under Section 564(b)(1) of the Act, 21 U.S.C. section 360bbb-3(b)(1), unless the authorization is terminated or revoked sooner.    Influenza A by PCR NEGATIVE NEGATIVE Final   Influenza B by PCR NEGATIVE NEGATIVE  Final    Comment: (NOTE) The Xpert Xpress SARS-CoV-2/FLU/RSV assay is intended as an aid in  the diagnosis of influenza from Nasopharyngeal swab specimens and  should not be used as a sole basis for treatment. Nasal washings and  aspirates are unacceptable for Xpert Xpress SARS-CoV-2/FLU/RSV  testing. Fact Sheet for Patients: PinkCheek.be Fact Sheet for Healthcare Providers: GravelBags.it This test is not yet approved or cleared by the [place] FDA and  has been authorized for detection and/or diagnosis of SARS-CoV-2 by  FDA under an Emergency Use Authorization (EUA). This EUA will remain  in effect (meaning this test can be used) for the duration of the  Covid-19 declaration under Section 564(b)(1) of the Act, 21  U.S.C. section 360bbb-3(b)(1), unless the authorization is  terminated or revoked. Performed at [facility], [address] [place], [place] [address]      Labs: BNP (last 3 results) Recent Labs    11/24/19 0245  BNP [id number]*   Basic Metabolic Panel: Recent Labs  Lab 11/24/19 0245 11/25/19 0506 11/26/19 0412  NA 135 135 135  K 3.7 3.5 3.3*  CL 104 103 104  CO2 19* 23 22  GLUCOSE 289* 272* 206*  BUN 15 17 16   CREATININE 0.80 0.81 0.65  CALCIUM 8.5* 8.1* 8.3*   MG 1.6*  --   --    Liver Function Tests: Recent Labs  Lab 11/24/19 0245  AST 21  ALT 18  ALKPHOS 65  BILITOT 0.9  PROT 7.3  ALBUMIN 3.3*   No results for input(s): LIPASE, AMYLASE in the last 168 hours. No results for input(s): AMMONIA in the last 168 hours. CBC: Recent Labs  Lab 11/24/19 0245 11/25/19 0506  WBC 14.2* 9.5  NEUTROABS 11.7*  --   HGB 10.3* 8.6*  HCT 31.9* 26.1*  MCV 91.1 90.3  PLT 302 258   Cardiac Enzymes: No results for input(s): CKTOTAL, CKMB, CKMBINDEX, TROPONINI in the last 168 hours. BNP: Invalid input(s): POCBNP CBG: Recent Labs  Lab 11/25/19 0929 11/25/19 1337 11/25/19 1630 11/25/19 2152 11/26/19 0743  GLUCAP 206* 165* 154* 220* 207*   D-Dimer No results for input(s): DDIMER in the last 72 hours. Hgb A1c Recent Labs    11/24/19 1738  HGBA1C 9.1*   Lipid Profile No results for input(s): CHOL, HDL, LDLCALC, TRIG, CHOLHDL, LDLDIRECT in the last 72 hours. Thyroid function studies Recent Labs    11/24/19 0245  TSH 2.776   Anemia work up No results for input(s): VITAMINB12, FOLATE, FERRITIN, TIBC, IRON, RETICCTPCT in the last 72 hours. Urinalysis    Component Value Date/Time   COLORURINE YELLOW 08/25/2014 1405   APPEARANCEUR CLEAR 08/25/2014 1405   LABSPEC 1.025 08/25/2014 1405   PHURINE 6.5 08/25/2014 1405   GLUCOSEU NEGATIVE 08/25/2014 1405   HGBUR NEGATIVE 08/25/2014 1405   HGBUR trace-lysed 12/19/2006 1327   BILIRUBINUR NEGATIVE 08/25/2014 1405   KETONESUR NEGATIVE 08/25/2014 1405   PROTEINUR 100 (A) 08/25/2014 1405   UROBILINOGEN 1.0 08/25/2014 1405   NITRITE NEGATIVE 08/25/2014 1405   LEUKOCYTESUR TRACE (A) 08/25/2014 1405   Sepsis Labs Invalid input(s): PROCALCITONIN,  WBC,  LACTICIDVEN Microbiology Recent Results (from the past 240 hour(s))  Respiratory Panel by RT PCR (Flu A&B, Covid) - Nasopharyngeal Swab     Status: None   Collection Time: 11/24/19  4:15 AM   Specimen: Nasopharyngeal Swab  Result Value  Ref Range Status   SARS Coronavirus 2 by RT PCR NEGATIVE NEGATIVE Final    Comment: (NOTE) SARS-CoV-2  target nucleic acids are NOT DETECTED. The SARS-CoV-2 RNA is generally detectable in upper respiratoy specimens during the acute phase of infection. The lowest concentration of SARS-CoV-2 viral copies this assay can detect is 131 copies/mL. A negative result does not preclude SARS-Cov-2 infection and should not be used as the sole basis for treatment or other patient management decisions. A negative result may occur with  improper specimen collection/handling, submission of specimen other than nasopharyngeal swab, presence of viral mutation(s) within the areas targeted by this assay, and inadequate number of viral copies (<131 copies/mL). A negative result must be combined with clinical observations, patient history, and epidemiological information. The expected result is Negative. Fact Sheet for Patients:  PinkCheek.be Fact Sheet for Healthcare Providers:  GravelBags.it This test is not yet ap proved or cleared by the [place] FDA and  has been authorized for detection and/or diagnosis of SARS-CoV-2 by FDA under an Emergency Use Authorization (EUA). This EUA will remain  in effect (meaning this test can be used) for the duration of the COVID-19 declaration under Section 564(b)(1) of the Act, 21 U.S.C. section 360bbb-3(b)(1), unless the authorization is terminated or revoked sooner.    Influenza A by PCR NEGATIVE NEGATIVE Final   Influenza B by PCR NEGATIVE NEGATIVE Final    Comment: (NOTE) The Xpert Xpress SARS-CoV-2/FLU/RSV assay is intended as an aid in  the diagnosis of influenza from Nasopharyngeal swab specimens and  should not be used as a sole basis for treatment. Nasal washings and  aspirates are unacceptable for Xpert Xpress SARS-CoV-2/FLU/RSV  testing. Fact Sheet for  Patients: PinkCheek.be Fact Sheet for Healthcare Providers: GravelBags.it This test is not yet approved or cleared by the [place] FDA and  has been authorized for detection and/or diagnosis of SARS-CoV-2 by  FDA under an Emergency Use Authorization (EUA). This EUA will remain  in effect (meaning this test can be used) for the duration of the  Covid-19 declaration under Section 564(b)(1) of the Act, 21  U.S.C. section 360bbb-3(b)(1), unless the authorization is  terminated or revoked. Performed at [facility], [address] [place], [place] [address]      Time coordinating discharge: 35 minutes  SIGNED:   Louellen Molder, MD  [facility] 11/26/2019, 11:06 AM Pager   If 7PM-7AM, please contact night-coverage [email or web address] Password [email or web address]

## 2019-11-26 NOTE — Discharge Instructions (Signed)
Diabetes Mellitus and Nutrition, Adult When you have diabetes (diabetes mellitus), it is very important to have healthy eating habits because your blood sugar (glucose) levels are greatly affected by what you eat and drink. Eating healthy foods in the appropriate amounts, at about the same times every day, can help you:  Control your blood glucose.  Lower your risk of heart disease.  Improve your blood pressure.  Reach or maintain a healthy weight. Every person with diabetes is different, and each person has different needs for a meal plan. Your health care provider may recommend that you work with a diet and nutrition specialist (dietitian) to make a meal plan that is best for you. Your meal plan may vary depending on factors such as:  The calories you need.  The medicines you take.  Your weight.  Your blood glucose, blood pressure, and cholesterol levels.  Your activity level.  Other health conditions you have, such as heart or kidney disease. How do carbohydrates affect me? Carbohydrates, also called carbs, affect your blood glucose level more than any other type of food. Eating carbs naturally raises the amount of glucose in your blood. Carb counting is a method for keeping track of how many carbs you eat. Counting carbs is important to keep your blood glucose at a healthy level, especially if you use insulin or take certain oral diabetes medicines. It is important to know how many carbs you can safely have in each meal. This is different for every person. Your dietitian can help you calculate how many carbs you should have at each meal and for each snack. Foods that contain carbs include:  Bread, cereal, rice, pasta, and crackers.  Potatoes and corn.  Peas, beans, and lentils.  Milk and yogurt.  Fruit and juice.  Desserts, such as cakes, cookies, ice cream, and candy. How does alcohol affect me? Alcohol can cause a sudden decrease in blood glucose (hypoglycemia),  especially if you use insulin or take certain oral diabetes medicines. Hypoglycemia can be a life-threatening condition. Symptoms of hypoglycemia (sleepiness, dizziness, and confusion) are similar to symptoms of having too much alcohol. If your health care provider says that alcohol is safe for you, follow these guidelines:  Limit alcohol intake to no more than 1 drink per day for nonpregnant women and 2 drinks per day for men. One drink equals 12 oz of beer, 5 oz of wine, or 1 oz of hard liquor.  Do not drink on an empty stomach.  Keep yourself hydrated with water, diet soda, or unsweetened iced tea.  Keep in mind that regular soda, juice, and other mixers may contain a lot of sugar and must be counted as carbs. What are tips for following this plan?  Reading food labels  Start by checking the serving size on the "Nutrition Facts" label of packaged foods and drinks. The amount of calories, carbs, fats, and other nutrients listed on the label is based on one serving of the item. Many items contain more than one serving per package.  Check the total grams (g) of carbs in one serving. You can calculate the number of servings of carbs in one serving by dividing the total carbs by 15. For example, if a food has 30 g of total carbs, it would be equal to 2 servings of carbs.  Check the number of grams (g) of saturated and trans fats in one serving. Choose foods that have low or no amount of these fats.  Check the number of   milligrams (mg) of salt (sodium) in one serving. Most people should limit total sodium intake to less than 2,300 mg per day.  Always check the nutrition information of foods labeled as "low-fat" or "nonfat". These foods may be higher in added sugar or refined carbs and should be avoided.  Talk to your dietitian to identify your daily goals for nutrients listed on the label. Shopping  Avoid buying canned, premade, or processed foods. These foods tend to be high in fat, sodium,  and added sugar.  Shop around the outside edge of the grocery store. This includes fresh fruits and vegetables, bulk grains, fresh meats, and fresh dairy. Cooking  Use low-heat cooking methods, such as baking, instead of high-heat cooking methods like deep frying.  Cook using healthy oils, such as olive, canola, or sunflower oil.  Avoid cooking with butter, cream, or high-fat meats. Meal planning  Eat meals and snacks regularly, preferably at the same times every day. Avoid going long periods of time without eating.  Eat foods high in fiber, such as fresh fruits, vegetables, beans, and whole grains. Talk to your dietitian about how many servings of carbs you can eat at each meal.  Eat 4-6 ounces (oz) of lean protein each day, such as lean meat, chicken, fish, eggs, or tofu. One oz of lean protein is equal to: ? 1 oz of meat, chicken, or fish. ? 1 egg. ?  cup of tofu.  Eat some foods each day that contain healthy fats, such as avocado, nuts, seeds, and fish. Lifestyle  Check your blood glucose regularly.  Exercise regularly as told by your health care provider. This may include: ? 150 minutes of moderate-intensity or vigorous-intensity exercise each week. This could be brisk walking, biking, or water aerobics. ? Stretching and doing strength exercises, such as yoga or weightlifting, at least 2 times a week.  Take medicines as told by your health care provider.  Do not use any products that contain nicotine or tobacco, such as cigarettes and e-cigarettes. If you need help quitting, ask your health care provider.  Work with a Social worker or diabetes educator to identify strategies to manage stress and any emotional and social challenges. Questions to ask a health care provider  Do I need to meet with a diabetes educator?  Do I need to meet with a dietitian?  What number can I call if I have questions?  When are the best times to check my blood glucose? Where to find more  information:  American Diabetes Association: diabetes.org  Academy of Nutrition and Dietetics: www.eatright.[employer] of Diabetes and Digestive and Kidney Diseases (NIH): [employer] Summary  A healthy meal plan will help you control your blood glucose and maintain a healthy lifestyle.  Working with a diet and nutrition specialist (dietitian) can help you make a meal plan that is best for you.  Keep in mind that carbohydrates (carbs) and alcohol have immediate effects on your blood glucose levels. It is important to count carbs and to use alcohol carefully. This information is not intended to replace advice given to you by your health care provider. Make sure you discuss any questions you have with your health care provider. Document Revised: 08/02/2017 Document Reviewed: 09/24/2016 Elsevier Patient Education  2020 [place].     Heart Failure, Self Care Heart failure is a serious condition. This document explains the things you need to do to take care of yourself after a heart failure diagnosis. You may be asked to change  your diet, take certain medicines, and make other lifestyle changes in order to stay as healthy as possible. Your health care provider may also give you more specific instructions. If you have problems or questions, contact your health care provider. What are the risks? Having heart failure puts you at higher risk for certain problems. These problems can get worse if you do not take good care of yourself. Problems may include:  Blood clotting problems. This may cause a stroke.  Damage to the kidneys, liver, or lungs.  Abnormal heart rhythms. Supplies needed:  Scale for monitoring weight.  Blood pressure monitor.  Notebook.  Medicines. How to care for yourself when you have heart failure Medicines Take over-the-counter and prescription medicines only as told by your health care provider. Medicines reduce the workload of your heart,  slow the progression of heart failure, and improve symptoms. Take your medicines every day.  Do not stop taking your medicine unless your health care provider tells you to do so.  Do not skip any dose of medicine.  Refill your prescriptions before you run out of medicine. Eating and drinking   Eat heart-healthy foods. Talk with a dietitian to make an eating plan that is right for you. ? Choose foods that contain no trans fat and are low in saturated fat and cholesterol. Healthy choices include fresh or frozen fruits and vegetables, fish, lean meats, legumes, fat-free or low-fat dairy products, and whole-grain or high-fiber foods. ? Limit salt (sodium) if told by your health care provider. Sodium restriction may reduce symptoms of heart failure. Ask a dietitian to recommend heart-healthy seasonings. ? Use healthy cooking methods instead of frying. Healthy methods include roasting, grilling, broiling, baking, poaching, steaming, and stir-frying.  Limit your fluid intake, if directed by your health care provider. Fluid restriction may reduce symptoms of heart failure. Alcohol use  Do not drink alcohol if: ? Your health care provider tells you not to drink. ? Your heart was damaged by alcohol, or you have severe heart failure. ? You are pregnant, may be pregnant, or are planning to become pregnant.  If you drink alcohol: ? Limit how much you use to:  0-1 drink a day for women.  0-2 drinks a day for men. ? Be aware of how much alcohol is in your drink. In the [place] one drink equals one 12 oz bottle of beer (355 mL), one 5 oz glass of wine (148 mL), or one 1 oz glass of hard liquor (44 mL). Lifestyle   Do not use any products that contain nicotine or tobacco, such as cigarettes, e-cigarettes, and chewing tobacco. If you need help quitting, ask your health care provider. ? Do not use nicotine gum or patches before talking to your health care provider.  Do not use illegal drugs.  Work  with your health care provider to safely reach the right body weight.  Do physical activity if told by your health care provider. Talk to your health care provider before you begin an exercise if: ? You are an older adult. ? You have severe heart failure.  Learn to manage stress. If you need help to do this, ask your health care provider.  Participate in or seek rehabilitation as needed to keep or improve your independence and quality of life.  Plan rest periods when you get tired. Monitoring important information   Weigh yourself every day. This will help you to notice if too much fluid is building up in your body. ? Weigh yourself  every morning after you urinate and before you eat breakfast. ? Wear the same amount of clothing each time you weigh yourself. ? Record your daily weight. Provide your health care provider with your weight record.  Monitor and record your pulse and blood pressure as told by your health care provider. Dealing with extreme temperatures  If the weather is extremely hot: ? Avoid vigorous physical activity. ? Use air conditioning or fans, or find a cooler location. ? Avoid caffeine and alcohol. ? Wear loose-fitting, lightweight, and light-colored clothing.  If the weather is extremely cold: ? Avoid vigorous activity. ? Layer your clothes. ? Wear mittens or gloves, a hat, and a scarf when you go outside. ? Avoid alcohol. Follow these instructions at home:  Stay up to date with vaccines. Pneumococcal and flu (influenza) vaccines are especially important in preventing infections of the airways.  Keep all follow-up visits as told by your health care provider. This is important. Contact a health care provider if you:  Have a rapid weight gain.  Have increasing shortness of breath.  Are unable to participate in your usual physical activities.  Get tired easily.  Cough more than normal, especially with physical activity.  Lose your appetite or feel  nauseous.  Have any swelling or more swelling in areas such as your hands, feet, ankles, or abdomen.  Are unable to sleep because it is hard to breathe.  Feel like your heart is beating quickly (palpitations).  Become dizzy or light-headed when you stand up. Get help right away if you:  Have trouble breathing.  Notice or your family notices a change in your awareness, such as having trouble staying awake or concentrating.  Have pain or discomfort in your chest.  Have an episode of fainting (syncope). These symptoms may represent a serious problem that is an emergency. Do not wait to see if the symptoms will go away. Get medical help right away. Call your local emergency services (911 in the [place] Do not drive yourself to the hospital. Summary  Heart failure is a serious condition. To care for yourself, you may be asked to change your diet, take certain medicines, and make other lifestyle changes.  Take your medicines every day. Do not stop taking them unless your health care provider tells you to do so.  Eat heart-healthy foods, such as fresh or frozen fruits and vegetables, fish, lean meats, legumes, fat-free or low-fat dairy products, and whole-grain or high-fiber foods.  Ask your health care provider if you have any alcohol restrictions. You may have to stop drinking alcohol if you have severe heart failure.  Contact your health care provider if you notice problems, such as rapid weight gain or a fast heartbeat. Get help right away if you faint, or have chest pain or trouble breathing. This information is not intended to replace advice given to you by your health care provider. Make sure you discuss any questions you have with your health care provider. Document Revised: 12/02/2018 Document Reviewed: 12/03/2018 Elsevier Patient Education  [place].

## 2019-11-26 NOTE — Progress Notes (Signed)
Progress Note  Patient Name: Lauren Delgado Date of Encounter: 11/26/2019  Primary Cardiologist: Pixie Casino, MD   Subjective   Reports breathing is back to her baseline Denies abdominal bloating, leg swelling Has been able to ambulate without any significant symptoms Reports difficulty at times with her medication compliance especially hydralazine middle of the day -Blood pressure reviewed, [id number] systolic -2 L  Inpatient Medications    Scheduled Meds: . aspirin  81 mg Oral Daily  . docusate sodium  100 mg Oral BID  . furosemide  40 mg Intravenous Q12H  . heparin  5,000 Units Subcutaneous Q8H  . hydrALAZINE  50 mg Oral TID  . insulin aspart  0-16 Units Subcutaneous TID PC  . insulin aspart  0-20 Units Subcutaneous TID WC  . insulin aspart  0-5 Units Subcutaneous QHS  . insulin aspart  9 Units Subcutaneous TID WC  . insulin glargine  35 Units Subcutaneous QHS  . isosorbide mononitrate  60 mg Oral Daily  . labetalol  200 mg Oral TID  . multivitamin with minerals  1 tablet Oral Daily  . NIFEdipine  60 mg Oral QHS  . senna-docusate  1 tablet Oral BID  . sodium chloride flush  3 mL Intravenous Q12H   Continuous Infusions: . sodium chloride     PRN Meds: sodium chloride, acetaminophen, ondansetron (ZOFRAN) IV, sodium chloride flush   Vital Signs    Vitals:   11/25/19 1928 11/25/19 2220 11/26/19 0401 11/26/19 0744  BP: 132/63 (!) 147/78 131/72 (!) 143/81  Pulse: 97 (!) 102 97 97  Resp:    19  Temp: 98.2 F (36.8 C)  99.1 F (37.3 C) 98.5 F (36.9 C)  TempSrc: Oral  Oral Oral  SpO2: 98% 99% 95% 98%  Weight:   133.4 kg   Height:        Intake/Output Summary (Last 24 hours) at 11/26/2019 1602 Last data filed at 11/26/2019 0950 Gross per 24 hour  Intake 120 ml  Output 1200 ml  Net -1080 ml   Last 3 Weights 11/26/2019 11/25/2019 11/24/2019  Weight (lbs) 294 lb 295 lb 14.4 oz 296 lb  Weight (kg) 133.358 kg 134.219 kg 134.265 kg      Telemetry    Normal  sinus rhythm- Personally Reviewed  ECG    - Personally Reviewed  Physical Exam   GEN: No acute distress.  Obese Neck: No JVD Cardiac: RRR, no murmurs, rubs, or gallops.  Respiratory: Clear to auscultation bilaterally. GI: Soft, nontender, non-distended  MS: No edema; No deformity. Neuro:  Nonfocal  Psych: Normal affect   Labs    High Sensitivity Troponin:   Recent Labs  Lab 11/24/19 0245 11/24/19 0415  TROPONINIHS 8 10      Chemistry Recent Labs  Lab 11/24/19 0245 11/25/19 0506 11/26/19 0412  NA 135 135 135  K 3.7 3.5 3.3*  CL 104 103 104  CO2 19* 23 22  GLUCOSE 289* 272* 206*  BUN 15 17 16   CREATININE 0.80 0.81 0.65  CALCIUM 8.5* 8.1* 8.3*  PROT 7.3  --   --   ALBUMIN 3.3*  --   --   AST 21  --   --   ALT 18  --   --   ALKPHOS 65  --   --   BILITOT 0.9  --   --   GFRNONAA >60 >60 >60  GFRAA >60 >60 >60  ANIONGAP 12 9 9      Hematology Recent Labs  Lab 11/24/19 0245 11/25/19 0506  WBC 14.2* 9.5  RBC 3.50* 2.89*  HGB 10.3* 8.6*  HCT 31.9* 26.1*  MCV 91.1 90.3  MCH 29.4 29.8  MCHC 32.3 33.0  RDW 13.1 13.1  PLT 302 258    BNP Recent Labs  Lab 11/24/19 0245  BNP 123.0*     DDimer No results for input(s): DDIMER in the last 168 hours.   Radiology    CT ANGIO CHEST PE W OR WO CONTRAST  Result Date: 11/24/2019 CLINICAL DATA:  Shortness of breath EXAM: CT ANGIOGRAPHY CHEST WITH CONTRAST TECHNIQUE: Multidetector CT imaging of the chest was performed using the standard protocol during bolus administration of intravenous contrast. Multiplanar CT image reconstructions and MIPs were obtained to evaluate the vascular anatomy. CONTRAST:  1mL OMNIPAQUE IOHEXOL 350 MG/ML SOLN COMPARISON:  11/01/2017 FINDINGS: Cardiovascular: Heart is upper limits normal in size. Aorta is normal caliber. Densely calcified coronary arteries versus coronary artery stents. This is new since prior study. No filling defects in the pulmonary arteries to suggest pulmonary  emboli. Mediastinum/Nodes: No mediastinal, hilar, or axillary adenopathy. Lungs/Pleura: Extensive diffuse bilateral airspace disease, most confluent in the lower lungs. This could reflect edema or infection. Small bilateral effusions. Upper Abdomen: Imaging into the upper abdomen shows no acute findings. Musculoskeletal: Chest wall soft tissues are unremarkable. No acute bony abnormality. Review of the MIP images confirms the above findings. IMPRESSION: Extensive bilateral airspace disease with lower lobe predominance and with small effusions. Findings could reflect edema or infection. Borderline cardiomegaly. Extensive coronary artery calcifications versus stents, new since prior study. No evidence of pulmonary embolus. Electronically Signed   By: Rolm Baptise M.D.   On: 11/24/2019 19:18    Cardiac Studies     Patient Profile     34 y.o. female   Assessment & Plan    Acute pulmonary edema In the setting of hypertensive heart disease with CHF Has been noncompliant to some degree with her blood pressure medications -Blood pressure improved on high-dose labetalol, nifedipine, Imdur, hydralazine --Treated with IV Lasix, 2 L negative Feels back to her baseline, she desires to be discharged today --Recommend close follow-up in outpatient clinic It appears she has a heart failure clinic appointment December 07, 2019 at [facility] --Would recommend follow-up in [facility] cardiology clinic (she was just seen November 13, 2019 at [facility] office)  Cardiomyopathy, dilated Ejection fraction 55 to 60% this admission, no significant valvular heart disease Stressed importance of aggressive blood pressure control  History of coronary disease, prior PCI On aspirin, Statin on hold as she is pregnant Denies any anginal symptoms  Hypertensive heart disease with CHF Treated with IV Lasix, aggressive blood pressure control Feels back to her baseline HCTZ held, started on Lasix 20 daily with hydralazine 50 3 times  daily, nifedipine 60 daily, Imdur 90 daily, labetalol 200 3 times daily   Long discussion with her concerning importance of medication compliance, fluid and salt restriction, exercise program for weight loss  Total encounter time more than 35 minutes  Greater than 50% was spent in counseling and coordination of care with the patient     For questions or updates, please contact [place] Please consult [email or web address] for contact info under        Signed, Ida Rogue, MD  11/26/2019, 4:02 PM

## 2019-11-26 NOTE — TOC Transition Note (Signed)
Transition of Care [facility]) - CM/SW Discharge Note   Patient Details  Name: Lauren Delgado MRN: [id number] Date of Birth: 11-20-1985  [facility] CM/SW Contact:  Shelbie Ammons, RN Phone Number: 11/26/2019, 11:40 AM   Clinical Narrative:   RNCM will defer seeing patient again for HF screen. Nurse reports patient is discharging and she will review all info with her as well as follow up appt already scheduled with HF clinic.     Final next level of care: Home/Self Care Barriers to Discharge: No Barriers Identified   Patient Goals and CMS Choice Patient states their goals for this hospitalization and ongoing recovery are:: "I don't want to go to a nursing facility.  I'll consider home health, but I don't think I need it.      Discharge Placement                       Discharge Plan and Services In-house Referral: Clinical Social Work                                   Social Determinants of Health (SDOH) Interventions     Readmission Risk Interventions No flowsheet data found.

## 2019-11-26 NOTE — Hospital Course (Signed)
34 year old female with history of hypertension, currently [redacted] weeks pregnant, heart failure reduced ejection fraction, diabetes, hypertension who presents due to acute shortness of breath and edema.    Found to have elevated blood pressures on admission and started on nitroglycerin drip  echocardiogram showed preserved ejection fraction with EF 55 to 60%.    Chest CTA did not show any PE.

## 2019-11-27 ENCOUNTER — Ambulatory Visit: Payer: Medicaid Other | Admitting: Family Medicine

## 2019-11-27 ENCOUNTER — Telehealth: Payer: Self-pay | Admitting: Family

## 2019-11-27 ENCOUNTER — Ambulatory Visit: Payer: Medicaid Other | Admitting: Internal Medicine

## 2019-11-27 NOTE — Telephone Encounter (Signed)
LVM with patient regarding new patient CHF Clinic appointment we have scheduled after her recent hospital discharge on 3/25. Asked patient to call back to confirm appointment and to answer any possible questions.

## 2019-11-30 ENCOUNTER — Other Ambulatory Visit: Payer: Self-pay | Admitting: General Practice

## 2019-11-30 ENCOUNTER — Other Ambulatory Visit: Payer: Medicaid Other

## 2019-11-30 DIAGNOSIS — O0990 Supervision of high risk pregnancy, unspecified, unspecified trimester: Secondary | ICD-10-CM

## 2019-12-01 ENCOUNTER — Encounter (HOSPITAL_COMMUNITY): Payer: Self-pay | Admitting: *Deleted

## 2019-12-01 ENCOUNTER — Ambulatory Visit (HOSPITAL_COMMUNITY)
Admission: RE | Admit: 2019-12-01 | Discharge: 2019-12-01 | Disposition: A | Discharge status: Home / Self Care | Payer: Medicaid Other | Source: Ambulatory Visit | Attending: Obstetrics and Gynecology | Admitting: Obstetrics and Gynecology

## 2019-12-01 ENCOUNTER — Telehealth: Payer: Self-pay | Admitting: *Deleted

## 2019-12-01 ENCOUNTER — Other Ambulatory Visit: Payer: Self-pay

## 2019-12-01 ENCOUNTER — Ambulatory Visit (HOSPITAL_COMMUNITY): Payer: Medicaid Other | Admitting: *Deleted

## 2019-12-01 DIAGNOSIS — Z3A08 8 weeks gestation of pregnancy: Secondary | ICD-10-CM

## 2019-12-01 DIAGNOSIS — O1210 Gestational proteinuria, unspecified trimester: Secondary | ICD-10-CM | POA: Diagnosis not present

## 2019-12-01 DIAGNOSIS — O10019 Pre-existing essential hypertension complicating pregnancy, unspecified trimester: Secondary | ICD-10-CM

## 2019-12-01 DIAGNOSIS — O0990 Supervision of high risk pregnancy, unspecified, unspecified trimester: Secondary | ICD-10-CM | POA: Insufficient documentation

## 2019-12-01 DIAGNOSIS — O99412 Diseases of the circulatory system complicating pregnancy, second trimester: Secondary | ICD-10-CM | POA: Diagnosis not present

## 2019-12-01 DIAGNOSIS — O34219 Maternal care for unspecified type scar from previous cesarean delivery: Secondary | ICD-10-CM

## 2019-12-01 DIAGNOSIS — O3680X0 Pregnancy with inconclusive fetal viability, not applicable or unspecified: Secondary | ICD-10-CM

## 2019-12-01 DIAGNOSIS — O24111 Pre-existing diabetes mellitus, type 2, in pregnancy, first trimester: Secondary | ICD-10-CM

## 2019-12-01 DIAGNOSIS — O99210 Obesity complicating pregnancy, unspecified trimester: Secondary | ICD-10-CM | POA: Diagnosis not present

## 2019-12-01 NOTE — Telephone Encounter (Signed)
Spoke with patient and she has not been contacted to schedule follow up with her provider Dr. Debara Pickett. Sent message to his nurse. Patient reports that her weight is stable at 294 pounds with no fluid coming back and she has her medications. Let her know that I would send message to update providers and Dr. Lysbeth Penner nurse and they should be reaching out soon to assist with scheduling.

## 2019-12-01 NOTE — Telephone Encounter (Signed)
Thanks .. happy to see her.  Dr. H 

## 2019-12-01 NOTE — Telephone Encounter (Signed)
-----   Message from Minna Merritts, MD sent at 11/29/2019 12:59 PM EDT ----- I think she has follow-up with Dr. Debara Pickett after recent discharge Can we confirm appointment Can we also confirm her weight is stable that she has access to her medications and fluid is not coming back Thx TG ----- Message ----- From: Pixie Casino, MD Sent: 11/26/2019   5:58 PM EDT To: Minna Merritts, MD  Thanks Tim - complicated situation.  -[place] ----- Message ----- From: Minna Merritts, MD Sent: 11/26/2019   4:10 PM EDT To: Pixie Casino, MD, Alisa Graff, FNP  Hey [place],  Had the opportunity to meet your patient [name] Being discharged today from [facility] Presented with worsening shortness of breath, hypertensive heart failure Several liters diuresed, mild medication changes for blood pressure management She may need close follow-up to ensure medication compliance Has a appointment scheduled with CHF clinic [facility] beginning of April 2021 Thx TG

## 2019-12-02 ENCOUNTER — Ambulatory Visit (INDEPENDENT_AMBULATORY_CARE_PROVIDER_SITE_OTHER): Payer: Medicaid Other | Admitting: Obstetrics & Gynecology

## 2019-12-02 ENCOUNTER — Telehealth: Payer: Self-pay

## 2019-12-02 VITALS — BP 129/83 | HR 97 | Wt 296.4 lb

## 2019-12-02 DIAGNOSIS — O10911 Unspecified pre-existing hypertension complicating pregnancy, first trimester: Secondary | ICD-10-CM

## 2019-12-02 DIAGNOSIS — I5033 Acute on chronic diastolic (congestive) heart failure: Secondary | ICD-10-CM

## 2019-12-02 DIAGNOSIS — I519 Heart disease, unspecified: Secondary | ICD-10-CM

## 2019-12-02 DIAGNOSIS — O24311 Unspecified pre-existing diabetes mellitus in pregnancy, first trimester: Secondary | ICD-10-CM

## 2019-12-02 DIAGNOSIS — O0990 Supervision of high risk pregnancy, unspecified, unspecified trimester: Secondary | ICD-10-CM

## 2019-12-02 DIAGNOSIS — I429 Cardiomyopathy, unspecified: Secondary | ICD-10-CM

## 2019-12-02 DIAGNOSIS — O99411 Diseases of the circulatory system complicating pregnancy, first trimester: Secondary | ICD-10-CM

## 2019-12-02 DIAGNOSIS — Z955 Presence of coronary angioplasty implant and graft: Secondary | ICD-10-CM

## 2019-12-02 NOTE — Telephone Encounter (Signed)
Called pt with Cardiology appointment for tomorrow 12/03/19 @ 1:20p & MFM appt on 01/07/20 @ 10:15a, no answer, left VM.

## 2019-12-02 NOTE — Progress Notes (Signed)
PRENATAL VISIT NOTE  Subjective:  Lauren Delgado is a 34 y.o. G2P1001 at [redacted]w[redacted]d being seen today for ongoing prenatal care.  She is currently monitored for the following issues for this high-risk pregnancy and has Polycystic ovaries; Morbid obesity ([place]); History of cesarean section; Type 2 diabetes mellitus without complication ([place]); NSTEMI (non-ST elevated myocardial infarction) [facility]); ACS (acute coronary syndrome) ([place]); Hyperlipidemia associated with type 2 diabetes mellitus ([place]); Abdominal obesity and metabolic syndrome; Cardiomyopathy ([place]); Status post coronary artery stent placement; Coronary artery disease; Hypertension; Lipoma of right lower extremity; Healthcare maintenance; Sleep apnea; History of herpes genitalis; Supervision of high risk pregnancy, antepartum; Proteinuria affecting pregnancy, antepartum; Hyperglycemia due to type 2 diabetes mellitus ([place]); Flash pulmonary edema ([place]); Acute respiratory failure with hypoxia ([place]); Acute pulmonary edema ([place]); Acute on chronic diastolic CHF (congestive heart failure) ([place]); Hypertensive crisis; and Cardiac disease in mother affecting pregnancy in first trimester on their problem list.  Patient reports no complaints. Recently admitted to [facility] for acute on chronic heart failure, please refer to their notes for more details. Denies current chest pain or SOB. No vaginal bleeding or pelvic pain. Denies leaking of fluid.   The following portions of the patient's history were reviewed and updated as appropriate: allergies, current medications, past family history, past medical history, past social history, past surgical history and problem list.   Objective:   Vitals:   12/02/19 1419  BP: 129/83  Pulse: 97  Weight: 296 lb 6.4 oz (134.4 kg)    Fetal Status:     Movement: Absent     General:  Alert, oriented and cooperative. Patient is in no acute distress.  Skin: Skin is warm and dry. No rash noted.   Cardiovascular: Normal heart  rate noted  Respiratory: Normal respiratory effort, no problems with respiration noted  Abdomen: Soft, gravid, appropriate for gestational age.  Pain/Pressure: Absent     Pelvic: Cervical exam deferred        Extremities: Normal range of motion.  Edema: None  Mental Status: Normal mood and affect. Normal behavior. Normal judgment and thought content.   Imaging: CT ANGIO CHEST PE W OR WO CONTRAST  Result Date: 11/24/2019 CLINICAL DATA:  Shortness of breath EXAM: CT ANGIOGRAPHY CHEST WITH CONTRAST TECHNIQUE: Multidetector CT imaging of the chest was performed using the standard protocol during bolus administration of intravenous contrast. Multiplanar CT image reconstructions and MIPs were obtained to evaluate the vascular anatomy. CONTRAST:  53mL OMNIPAQUE IOHEXOL 350 MG/ML SOLN COMPARISON:  11/01/2017 FINDINGS: Cardiovascular: Heart is upper limits normal in size. Aorta is normal caliber. Densely calcified coronary arteries versus coronary artery stents. This is new since prior study. No filling defects in the pulmonary arteries to suggest pulmonary emboli. Mediastinum/Nodes: No mediastinal, hilar, or axillary adenopathy. Lungs/Pleura: Extensive diffuse bilateral airspace disease, most confluent in the lower lungs. This could reflect edema or infection. Small bilateral effusions. Upper Abdomen: Imaging into the upper abdomen shows no acute findings. Musculoskeletal: Chest wall soft tissues are unremarkable. No acute bony abnormality. Review of the MIP images confirms the above findings. IMPRESSION: Extensive bilateral airspace disease with lower lobe predominance and with small effusions. Findings could reflect edema or infection. Borderline cardiomegaly. Extensive coronary artery calcifications versus stents, new since prior study. No evidence of pulmonary embolus. Electronically Signed   By: Rolm Baptise M.D.   On: 11/24/2019 19:18   US Venous Img Lower Bilateral (DVT)  Result Date:  11/24/2019 CLINICAL DATA:  Bilateral lower extremity pain and edema. Evaluate  for DVT. EXAM: BILATERAL LOWER EXTREMITY VENOUS DOPPLER ULTRASOUND TECHNIQUE: Gray-scale sonography with graded compression, as well as color Doppler and duplex ultrasound were performed to evaluate the lower extremity deep venous systems from the level of the common femoral vein and including the common femoral, femoral, profunda femoral, popliteal and calf veins including the posterior tibial, peroneal and gastrocnemius veins when visible. The superficial great saphenous vein was also interrogated. Spectral Doppler was utilized to evaluate flow at rest and with distal augmentation maneuvers in the common femoral, femoral and popliteal veins. COMPARISON:  None. FINDINGS: Examination is degraded due to patient body habitus and poor sonographic window RIGHT LOWER EXTREMITY Common Femoral Vein: No evidence of thrombus. Normal compressibility, respiratory phasicity and response to augmentation. Saphenofemoral Junction: No evidence of thrombus. Normal compressibility and flow on color Doppler imaging. Profunda Femoral Vein: No evidence of thrombus. Normal compressibility and flow on color Doppler imaging. Femoral Vein: No evidence of thrombus. Normal compressibility, respiratory phasicity and response to augmentation. Popliteal Vein: No evidence of thrombus. Normal compressibility, respiratory phasicity and response to augmentation. Calf Veins: No evidence of thrombus. Normal compressibility and flow on color Doppler imaging. Superficial Great Saphenous Vein: No evidence of thrombus. Normal compressibility. Venous Reflux:  None. Other Findings:  None. LEFT LOWER EXTREMITY Common Femoral Vein: No evidence of thrombus. Normal compressibility, respiratory phasicity and response to augmentation. Saphenofemoral Junction: No evidence of thrombus. Normal compressibility and flow on color Doppler imaging. Profunda Femoral Vein: No evidence of  thrombus. Normal compressibility and flow on color Doppler imaging. Femoral Vein: No evidence of thrombus. Normal compressibility, respiratory phasicity and response to augmentation. Popliteal Vein: No evidence of thrombus. Normal compressibility, respiratory phasicity and response to augmentation. Calf Veins: No evidence of thrombus. Normal compressibility and flow on color Doppler imaging. Superficial Great Saphenous Vein: No evidence of thrombus. Normal compressibility. Venous Reflux:  None. Other Findings:  None. IMPRESSION: No evidence of DVT within either lower extremity. Electronically Signed   By: Sandi Mariscal M.D.   On: 11/24/2019 08:49   [place] MFM OB TRANSVAGINAL  Result Date: 12/01/2019 ----------------------------------------------------------------------  OBSTETRICS REPORT                       (Signed Final 12/01/2019 04:32 pm) ---------------------------------------------------------------------- Patient Info  ID #:       [id number]                          D.O.B.:  [birth year] (33 yrs)  Name:       Lauren Delgado              Visit Date: 12/01/2019 02:44 pm ---------------------------------------------------------------------- Performed By  Performed By:     Corky Crafts             Ref. Address:     [address]  [place], [place]  Attending:        Tama High MD        [facility]  Referred By:      Chancy Milroy                    MD ---------------------------------------------------------------------- Orders   #  Description                          Code         Ordered By   1  [place] MFM OB TRANSVAGINAL               [id number]      Arlina Robes   ----------------------------------------------------------------------   #  Order #                    Accession #                 Episode #   1  [id number]                  [id number]                  [id number]  ---------------------------------------------------------------------- Indications   Pregnancy with inconclusive fetal viability    O36.80X0   [redacted] weeks gestation of pregnancy                 Z3A.08   Maternal morbid obesity                        O99.210 E66.01   Hypertension - Chronic/Pre-existing (with      O10.019   CHF)   Heart disease in miother affecting pregnancy   O99.412   (H/O coronary disease, prior PCI;   cardiomyopathy; acute pulmonary edema,   H/O MI (2019)   History of cesarean delivery, currently        O34.219   pregnant   Pre-existing diabetes, type 2, in pregnancy,   O24.111   first trimester (Insulin)  ---------------------------------------------------------------------- Fetal Evaluation  Num Of Fetuses:         1  Preg. Location:         Intrauterine  Gest. Sac:              Intrauterine  Yolk Sac:               Visualized  Fetal Pole:             Visualized  Fetal Heart Rate(bpm):  166  Cardiac Activity:       Observed ---------------------------------------------------------------------- Biometry  CRL:  15.6  mm     G. Age:  8w 0d                   EDD:   07/12/20 ---------------------------------------------------------------------- OB History  Gravidity:    2         Term:   1  Living:       1 ---------------------------------------------------------------------- Gestational Age  LMP:           8w 1d         Date:  10/05/19                 EDD:   07/11/20  Best:          8w 1d      Det. By:  LMP  (10/05/19)          EDD:   07/11/20 ---------------------------------------------------------------------- Cervix Uterus Adnexa  Cervix  Normal appearance by transabdominal scan.  Uterus  No abnormality visualized.  Left Ovary  Size(cm)       3.6  x   2.2    x  2.3       Vol(ml): 9.54   Within normal limits.  Right Ovary  Size(cm)        2   x   1.6    x  2         Vol(ml): 3.35  Within normal limits. ---------------------------------------------------------------------- Impression  Patient is here for ultrasound evaluation of fetal viability.  She  was recently admitted at [facility] and was discharged on  11/26/2019.  She had acute on chronic diastolic congestive  heart failure that was treated.  She does not have shortness  of breath today.  After explaining, we performed a transvaginal ultrasound to  evaluate the pregnancy.  A single intrauterine gestation is  seen with CRL measurement consistent with her previously  established dates.  Good fetal heart activity seen.  We reassured the patient of the findings. ---------------------------------------------------------------------- Recommendations  Follow-up ultrasound as per protocol. ----------------------------------------------------------------------                  Tama High, MD Electronically Signed Final Report   12/01/2019 04:32 pm ----------------------------------------------------------------------  DG Chest Portable 1 View  Result Date: 11/24/2019 CLINICAL DATA:  Shortness of breath EXAM: PORTABLE CHEST 1 VIEW COMPARISON:  07/21/2019 FINDINGS: Extensive reticulonodular airspace disease, worsening since prior study. More confluent opacity noted at the right lung base which could reflect pneumonia. Heart is normal size. No effusions. No acute bony abnormality. IMPRESSION: Worsening diffuse reticulonodular airspace disease throughout the lungs. Confluent right lower lobe airspace opacity could reflect pneumonia. Electronically Signed   By: Rolm Baptise M.D.   On: 11/24/2019 03:08   ECHOCARDIOGRAM COMPLETE  Result Date: 11/24/2019    ECHOCARDIOGRAM REPORT   Patient Name:   Lauren Delgado Date of Exam: 11/24/2019 Medical Rec #:  [id number]          Height:       62.0 in Accession #:    [id number]         Weight:       296.0 lb Date  of Birth:  July 18, 1986           BSA:          2.258 m Patient Age:    33 years           BP:           151/93 mmHg Patient Gender: F  HR:           96 bpm. Exam Location:  [facility] Procedure: 2D Echo, Color Doppler and Cardiac Doppler Indications:     CHF-acute systolic 123456  History:         Patient has prior history of Echocardiogram examinations, most                  recent 11/02/2017. Risk Factors:Hypertension.  Sonographer:     Sherrie Sport RDCS (AE) Referring Phys:  [id number] Athena Masse Diagnosing Phys: Nelva Bush MD IMPRESSIONS  1. Left ventricular ejection fraction, by estimation, is 55 to 60%. The left ventricle has normal function. The left ventricle has no regional wall motion abnormalities. There is mild left ventricular hypertrophy. Left ventricular diastolic parameters are consistent with Grade II diastolic dysfunction (pseudonormalization). Elevated left atrial pressure.  2. Right ventricular systolic function is normal. The right ventricular size is normal.  3. The mitral valve is normal in structure. No evidence of mitral valve regurgitation.  4. The aortic valve was not well visualized. Aortic valve regurgitation is not visualized. No aortic stenosis is present. Comparison(s): A prior study was performed on 11/02/2017. LVEF has significantly improved. FINDINGS  Left Ventricle: Left ventricular ejection fraction, by estimation, is 55 to 60%. The left ventricle has normal function. The left ventricle has no regional wall motion abnormalities. The left ventricular internal cavity size was normal in size. There is  mild left ventricular hypertrophy. Left ventricular diastolic parameters are consistent with Grade II diastolic dysfunction (pseudonormalization). Elevated left atrial pressure. Right Ventricle: The right ventricular size is normal. No increase in right ventricular wall thickness. Right ventricular systolic function is normal. Left Atrium: Left atrial size was normal in size.  Right Atrium: Right atrial size was normal in size. Pericardium: The pericardium was not well visualized. Mitral Valve: The mitral valve is normal in structure. No evidence of mitral valve regurgitation. Tricuspid Valve: The tricuspid valve is not well visualized. Tricuspid valve regurgitation is trivial. Aortic Valve: The aortic valve was not well visualized. Aortic valve regurgitation is not visualized. No aortic stenosis is present. Aortic valve mean gradient measures 5.5 mmHg. Aortic valve peak gradient measures 8.6 mmHg. Aortic valve area, by VTI measures 2.16 cm. Pulmonic Valve: The pulmonic valve was not well visualized. Pulmonic valve regurgitation is not visualized. No evidence of pulmonic stenosis. Aorta: The aortic root is normal in size and structure. Pulmonary Artery: The pulmonary artery is not well seen. Venous: The inferior vena cava was not well visualized. IAS/Shunts: The interatrial septum was not well visualized.  LEFT VENTRICLE PLAX 2D LVIDd:         4.52 cm  Diastology LVIDs:         2.93 cm  LV e' lateral:   6.42 cm/s LV PW:         1.10 cm  LV E/e' lateral: 19.3 LV IVS:        1.02 cm  LV e' medial:    6.64 cm/s LVOT diam:     2.00 cm  LV E/e' medial:  18.7 LV SV:         64 LV SV Index:   29 LVOT Area:     3.14 cm  RIGHT VENTRICLE RV Basal diam:  3.14 cm RV S prime:     15.10 cm/s TAPSE (M-mode): 3.3 cm LEFT ATRIUM           Index       RIGHT ATRIUM  Index LA diam:      4.20 cm 1.86 cm/m  RA Area:     11.30 cm LA Vol (A2C): 68.8 ml 30.47 ml/m RA Volume:   24.70 ml  10.94 ml/m LA Vol (A4C): 32.7 ml 14.48 ml/m  AORTIC VALVE                    PULMONIC VALVE AV Area (Vmax):    2.33 cm     PV Vmax:        0.51 m/s AV Area (Vmean):   2.41 cm     PV Peak grad:   1.1 mmHg AV Area (VTI):     2.16 cm     RVOT Peak grad: 5 mmHg AV Vmax:           147.00 cm/s AV Vmean:          111.500 cm/s AV VTI:            0.298 m AV Peak Grad:      8.6 mmHg AV Mean Grad:      5.5 mmHg LVOT Vmax:          109.00 cm/s LVOT Vmean:        85.400 cm/s LVOT VTI:          0.205 m LVOT/AV VTI ratio: 0.69  AORTA Ao Root diam: 2.40 cm MITRAL VALVE                TRICUSPID VALVE MV Area (PHT): 2.56 cm     TR Peak grad:   8.6 mmHg MV Decel Time: 296 msec     TR Vmax:        147.00 cm/s MV E velocity: 124.00 cm/s MV A velocity: 75.20 cm/s   SHUNTS MV E/A ratio:  1.65         Systemic VTI:  0.20 m                             Systemic Diam: 2.00 cm Nelva Bush MD Electronically signed by Nelva Bush MD Signature Date/Time: 11/24/2019/1:04:51 PM    Final    US OB LESS THAN 14 WEEKS WITH OB TRANSVAGINAL  Result Date: 11/17/2019 CLINICAL DATA:  Unsure of LMP. First trimester pregnancy with inconclusive fetal viability. EXAM: OBSTETRIC <14 WK [place] AND TRANSVAGINAL OB US TECHNIQUE: Both transabdominal and transvaginal ultrasound examinations were performed for complete evaluation of the gestation as well as the maternal uterus, adnexal regions, and pelvic cul-de-sac. Transvaginal technique was performed to assess early pregnancy. COMPARISON:  None. FINDINGS: Intrauterine gestational sac: Single Yolk sac:  Visualized. Embryo:  Visualized. Cardiac Activity: Visualized. Heart Rate: 98 bpm CRL:  2 mm   5 w   5 d                  [place] EDC: 07/14/2020 Subchorionic hemorrhage:  None visualized. Maternal uterus/adnexae: Both ovaries are normal in appearance. No adnexal mass identified. Small amount of simple free fluid noted. IMPRESSION: Single living IUP with estimated gestational age of [redacted] weeks 5 days, and [place] EDC of 07/14/2020. Electronically Signed   By: Marlaine Hind M.D.   On: 11/17/2019 16:19    Assessment and Plan:  Pregnancy: G2P1001 at [redacted]w[redacted]d 1. Cardiac disease in mother affecting pregnancy in first trimester 2. Acute on chronic diastolic CHF (congestive heart failure) (H[facility]) 3. Cardiomyopathy, unspecified type ([place]) 4. Status post coronary artery stent placement 5. Preexisting hypertension  complicating pregnancy,  antepartum, first trimester Recent admission for acute on chronic heart failure.  But recent ECHO showed EF 55-60% (was 35-40%) at some point.  Discussed cardiovascular demands in pregnancy, increased morbidity and mortality risks.  This is a desired pregnancy.  Emphasized need for close surveillance, multidisciplinary management especially with MFM and Cardiology services. Referrals made to these services, will follow up recommendations. Continue all cardiac medications for now and monitor BP and symptoms very closely.   - AMB referral to maternal fetal medicine - Ambulatory referral to Cardiology  6. Preexisting diabetes complicating pregnancy in first trimester, antepartum Will bring blood sugar log next visit, forgot it this time. Emphasized need for close surveillance. Will also meet with diabetic educator soon. Continue current insulin regimen. - AMB referral to maternal fetal medicine - [place] MFM Fetal Nuchal Translucency; Future - Referral to Nutrition and Diabetes Services  7. Supervision of high risk pregnancy, antepartum Continue prenatal vitamins, other first trimester care. Routine obstetric precautions reviewed. Given her cardiac issues, patient should be followed preferentially (if possible) by Dr. Kennon Rounds or Dr.Stinson as these providers have board certification in [facility] Medicine, and are also on the committee that oversees the care of high risk pregnant cardiac patients in conjunction with MFM and Cardiology.  Please refer to After Visit Summary for other counseling recommendations.   Return in about 2 weeks (around 12/16/2019) for OFFICE [facility] Visit.  Future Appointments  Date Time Provider [place]  12/03/2019  1:20 PM Bensimhon, Shaune Pascal, MD [facility] None  12/03/2019  3:45 PM [place]  12/07/2019 10:00 AM Alisa Graff, FNP [facility] None  12/21/2019  2:35 PM Truett Mainland, DO [facility] [place]  01/07/2020 10:15 AM [facility] NURSE [place] [facility]    01/07/2020 10:15 AM [facility] [place] 4 [facility]  01/07/2020 11:00 AM [facility] MD RM [facility]    Verita Schneiders, MD

## 2019-12-02 NOTE — Patient Instructions (Signed)
First Trimester of Pregnancy The first trimester of pregnancy is from week 1 until the end of week 13 (months 1 through 3). A week after a sperm fertilizes an egg, the egg will implant on the wall of the uterus. This embryo will begin to develop into a baby. Genes from you and your partner will form the baby. The female genes will determine whether the baby will be a boy or a girl. At 6-8 weeks, the eyes and face will be formed, and the heartbeat can be seen on ultrasound. At the end of 12 weeks, all the baby's organs will be formed. Now that you are pregnant, you will want to do everything you can to have a healthy baby. Two of the most important things are to get good prenatal care and to follow your health care provider's instructions. Prenatal care is all the medical care you receive before the baby's birth. This care will help prevent, find, and treat any problems during the pregnancy and childbirth. Body changes during your first trimester Your body goes through many changes during pregnancy. The changes vary from woman to woman.  You may gain or lose a couple of pounds at first.  You may feel sick to your stomach (nauseous) and you may throw up (vomit). If the vomiting is uncontrollable, call your health care provider.  You may tire easily.  You may develop headaches that can be relieved by medicines. All medicines should be approved by your health care provider.  You may urinate more often. Painful urination may mean you have a bladder infection.  You may develop heartburn as a result of your pregnancy.  You may develop constipation because certain hormones are causing the muscles that push stool through your intestines to slow down.  You may develop hemorrhoids or swollen veins (varicose veins).  Your breasts may begin to grow larger and become tender. Your nipples may stick out more, and the tissue that surrounds them (areola) may become darker.  Your gums may bleed and may be  sensitive to brushing and flossing.  Dark spots or blotches (chloasma, mask of pregnancy) may develop on your face. This will likely fade after the baby is born.  Your menstrual periods will stop.  You may have a loss of appetite.  You may develop cravings for certain kinds of food.  You may have changes in your emotions from day to day, such as being excited to be pregnant or being concerned that something may go wrong with the pregnancy and baby.  You may have more vivid and strange dreams.  You may have changes in your hair. These can include thickening of your hair, rapid growth, and changes in texture. Some women also have hair loss during or after pregnancy, or hair that feels dry or thin. Your hair will most likely return to normal after your baby is born. What to expect at prenatal visits During a routine prenatal visit:  You will be weighed to make sure you and the baby are growing normally.  Your blood pressure will be taken.  Your abdomen will be measured to track your baby's growth.  The fetal heartbeat will be listened to between weeks 10 and 14 of your pregnancy.  Test results from any previous visits will be discussed. Your health care provider may ask you:  How you are feeling.  If you are feeling the baby move.  If you have had any abnormal symptoms, such as leaking fluid, bleeding, severe headaches, or abdominal   cramping.  If you are using any tobacco products, including cigarettes, chewing tobacco, and electronic cigarettes.  If you have any questions. Other tests that may be performed during your first trimester include:  Blood tests to find your blood type and to check for the presence of any previous infections. The tests will also be used to check for low iron levels (anemia) and protein on red blood cells (Rh antibodies). Depending on your risk factors, or if you previously had diabetes during pregnancy, you may have tests to check for high blood sugar  that affects pregnant women (gestational diabetes).  Urine tests to check for infections, diabetes, or protein in the urine.  An ultrasound to confirm the proper growth and development of the baby.  Fetal screens for spinal cord problems (spina bifida) and Down syndrome.  HIV (human immunodeficiency virus) testing. Routine prenatal testing includes screening for HIV, unless you choose not to have this test.  You may need other tests to make sure you and the baby are doing well. Follow these instructions at home: Medicines  Follow your health care provider's instructions regarding medicine use. Specific medicines may be either safe or unsafe to take during pregnancy.  Take a prenatal vitamin that contains at least 600 micrograms (mcg) of folic acid.  If you develop constipation, try taking a stool softener if your health care provider approves. Eating and drinking   Eat a balanced diet that includes fresh fruits and vegetables, whole grains, good sources of protein such as meat, eggs, or tofu, and low-fat dairy. Your health care provider will help you determine the amount of weight gain that is right for you.  Avoid raw meat and uncooked cheese. These carry germs that can cause birth defects in the baby.  Eating four or five small meals rather than three large meals a day may help relieve nausea and vomiting. If you start to feel nauseous, eating a few soda crackers can be helpful. Drinking liquids between meals, instead of during meals, also seems to help ease nausea and vomiting.  Limit foods that are high in fat and processed sugars, such as fried and sweet foods.  To prevent constipation: ? Eat foods that are high in fiber, such as fresh fruits and vegetables, whole grains, and beans. ? Drink enough fluid to keep your urine clear or pale yellow. Activity  Exercise only as directed by your health care provider. Most women can continue their usual exercise routine during  pregnancy. Try to exercise for 30 minutes at least 5 days a week. Exercising will help you: ? Control your weight. ? Stay in shape. ? Be prepared for labor and delivery.  Experiencing pain or cramping in the lower abdomen or lower back is a good sign that you should stop exercising. Check with your health care provider before continuing with normal exercises.  Try to avoid standing for long periods of time. Move your legs often if you must stand in one place for a long time.  Avoid heavy lifting.  Wear low-heeled shoes and practice good posture.  You may continue to have sex unless your health care provider tells you not to. Relieving pain and discomfort  Wear a good support bra to relieve breast tenderness.  Take warm sitz baths to soothe any pain or discomfort caused by hemorrhoids. Use hemorrhoid cream if your health care provider approves.  Rest with your legs elevated if you have leg cramps or low back pain.  If you develop varicose veins in   your legs, wear support hose. Elevate your feet for 15 minutes, 3-4 times a day. Limit salt in your diet. Prenatal care  Schedule your prenatal visits by the twelfth week of pregnancy. They are usually scheduled monthly at first, then more often in the last 2 months before delivery.  Write down your questions. Take them to your prenatal visits.  Keep all your prenatal visits as told by your health care provider. This is important. Safety  Wear your seat belt at all times when driving.  Make a list of emergency phone numbers, including numbers for family, friends, the hospital, and police and fire departments. General instructions  Ask your health care provider for a referral to a local prenatal education class. Begin classes no later than the beginning of month 6 of your pregnancy.  Ask for help if you have counseling or nutritional needs during pregnancy. Your health care provider can offer advice or refer you to specialists for help  with various needs.  Do not use hot tubs, steam rooms, or saunas.  Do not douche or use tampons or scented sanitary pads.  Do not cross your legs for long periods of time.  Avoid cat litter boxes and soil used by cats. These carry germs that can cause birth defects in the baby and possibly loss of the fetus by miscarriage or stillbirth.  Avoid all smoking, herbs, alcohol, and medicines not prescribed by your health care provider. Chemicals in these products affect the formation and growth of the baby.  Do not use any products that contain nicotine or tobacco, such as cigarettes and e-cigarettes. If you need help quitting, ask your health care provider. You may receive counseling support and other resources to help you quit.  Schedule a dentist appointment. At home, brush your teeth with a soft toothbrush and be gentle when you floss. Contact a health care provider if:  You have dizziness.  You have mild pelvic cramps, pelvic pressure, or nagging pain in the abdominal area.  You have persistent nausea, vomiting, or diarrhea.  You have a bad smelling vaginal discharge.  You have pain when you urinate.  You notice increased swelling in your face, hands, legs, or ankles.  You are exposed to fifth disease or chickenpox.  You are exposed to German measles (rubella) and have never had it. Get help right away if:  You have a fever.  You are leaking fluid from your vagina.  You have spotting or bleeding from your vagina.  You have severe abdominal cramping or pain.  You have rapid weight gain or loss.  You vomit blood or material that looks like coffee grounds.  You develop a severe headache.  You have shortness of breath.  You have any kind of trauma, such as from a fall or a car accident. Summary  The first trimester of pregnancy is from week 1 until the end of week 13 (months 1 through 3).  Your body goes through many changes during pregnancy. The changes vary from  woman to woman.  You will have routine prenatal visits. During those visits, your health care provider will examine you, discuss any test results you may have, and talk with you about how you are feeling. This information is not intended to replace advice given to you by your health care provider. Make sure you discuss any questions you have with your health care provider. Document Revised: 08/02/2017 Document Reviewed: 08/01/2016 Elsevier Patient Education  2020 [employer]  

## 2019-12-02 NOTE — Telephone Encounter (Signed)
Called pt to let her know that her BP Cuff is ready for pick up at [facility], no answer, left VM with address.

## 2019-12-02 NOTE — Telephone Encounter (Signed)
LM for patient to call back to schedule f/u OK to use a "held" slot - like 48 hour acute, etc

## 2019-12-03 ENCOUNTER — Inpatient Hospital Stay (HOSPITAL_COMMUNITY): Admission: RE | Admit: 2019-12-03 | Payer: Medicaid Other | Source: Ambulatory Visit | Admitting: Internal Medicine

## 2019-12-03 ENCOUNTER — Other Ambulatory Visit: Payer: Self-pay

## 2019-12-03 ENCOUNTER — Ambulatory Visit (INDEPENDENT_AMBULATORY_CARE_PROVIDER_SITE_OTHER): Payer: Medicaid Other | Admitting: Clinical

## 2019-12-03 DIAGNOSIS — F4323 Adjustment disorder with mixed anxiety and depressed mood: Secondary | ICD-10-CM

## 2019-12-03 DIAGNOSIS — Z3A00 Weeks of gestation of pregnancy not specified: Secondary | ICD-10-CM

## 2019-12-03 DIAGNOSIS — O99340 Other mental disorders complicating pregnancy, unspecified trimester: Secondary | ICD-10-CM | POA: Diagnosis not present

## 2019-12-03 NOTE — BH Specialist Note (Signed)
Integrated Behavioral Health via Telemedicine Video Visit  12/03/2019 SHARIANN MALINSKI [id number]  Number of [place] visits: 1 Session Start time: 3:47  Session End time: 4:04 Total time: 17  Referring Provider: Arlina Robes, MD Type of Visit: Video Patient/Family location: [facility] Provider location: [facility] All persons participating in visit: Patient Latosha Miedema and [place]    Confirmed patient's address: Yes  Confirmed patient's phone number: Yes  Any changes to demographics: No   Confirmed patient's insurance: Yes  Any changes to patient's insurance: No   Discussed confidentiality: Yes   I connected with [name] by a video enabled telemedicine application and verified that I am speaking with the correct person using two identifiers.     I discussed the limitations of evaluation and management by telemedicine and the availability of in person appointments.  I discussed that the purpose of this visit is to provide behavioral health care while limiting exposure to the novel coronavirus.   Discussed there is a possibility of technology failure and discussed alternative modes of communication if that failure occurs.  I discussed that engaging in this video visit, they consent to the provision of behavioral healthcare and the services will be billed under their insurance.  Patient and/or legal guardian expressed understanding and consented to video visit: Yes   PRESENTING CONCERNS: Patient and/or family reports the following symptoms/concerns: Pt states her primary concern today is feeling fatigue, sleep difficulty and  nausea in pregnancy, along with increase in symptoms of depression.  Duration of problem: Current pregnancy; Severity of problem: moderate  GOALS ADDRESSED: Patient will: 1.  Reduce symptoms of: anxiety and depression  2.  Increase knowledge and/or ability of: healthy habits  3.  Demonstrate  ability to: Increase healthy adjustment to current life circumstances and Increase motivation to adhere to plan of care  INTERVENTIONS: Interventions utilized:  Sleep Hygiene and Psychoeducation and/or Health Education Standardized Assessments completed: Took PHQ9/GAD7 in past two weeks  ASSESSMENT: Patient currently experiencing Adjustment disorder with mixed anxiety and depression.   Patient may benefit from psychoeducation and brief therapeutic interventions regarding coping with symptoms of depression and anxiety .  PLAN: 1. Follow up with behavioral health clinician on : Two weeks 2. Behavioral recommendations:  -Continue taking prenatal vitamin daily -Consider adding iron-rich foods to daily meals  -Consider using sleep sounds at night for improved sleep 3. Referral(s): [place] (In Clinic)  I discussed the assessment and treatment plan with the patient and/or parent/guardian. They were provided an opportunity to ask questions and all were answered. They agreed with the plan and demonstrated an understanding of the instructions.   They were advised to call back or seek an in-person evaluation if the symptoms worsen or if the condition fails to improve as anticipated.  Caroleen Hamman [facility]  Depression screen [facility] 2/9 11/18/2019 11/13/2019 06/29/2019 01/20/2019 02/20/2018  Decreased Interest 2 0 0 0 0  Down, Depressed, Hopeless 2 0 0 0 0  PHQ - 2 Score 4 0 0 0 0  Altered sleeping 2 - - - -  Tired, decreased energy 2 - - - -  Change in appetite 2 - - - -  Feeling bad or failure about yourself  2 - - - -  Trouble concentrating 2 - - - -  Moving slowly or fidgety/restless 1 - - - -  Suicidal thoughts 1 - - - -  PHQ-9 Score 16 - - - -   GAD 7 :  Generalized Anxiety Score 11/18/2019  Nervous, Anxious, on Edge 1  Control/stop worrying 2  Worry too much - different things 2  Trouble relaxing 2  Restless 1  Afraid - awful might happen 1

## 2019-12-03 NOTE — Patient Instructions (Signed)

## 2019-12-04 ENCOUNTER — Encounter: Payer: Self-pay | Admitting: Family Medicine

## 2019-12-04 NOTE — Telephone Encounter (Signed)
Patient has HF clinic appointment with Dr. Haroldine Laws on 12/10/2019 How soon does she need to f/u with Dr. Debara Pickett?

## 2019-12-04 NOTE — Telephone Encounter (Signed)
Can be 3 months if she is going to see Linna Hoff.  DR. Lemmie Evens

## 2019-12-07 ENCOUNTER — Ambulatory Visit: Payer: Medicaid Other | Admitting: Family

## 2019-12-07 NOTE — Telephone Encounter (Signed)
Recall entered for 3 months Message sent to patient via [place] explaining MD suggested a 3 month f/u

## 2019-12-08 ENCOUNTER — Other Ambulatory Visit (INDEPENDENT_AMBULATORY_CARE_PROVIDER_SITE_OTHER): Payer: Medicaid Other | Admitting: Family Medicine

## 2019-12-08 DIAGNOSIS — N390 Urinary tract infection, site not specified: Secondary | ICD-10-CM

## 2019-12-08 NOTE — Addendum Note (Signed)
Addended by: Pauletta Browns on: 12/08/2019 10:03 AM   Modules accepted: Orders

## 2019-12-08 NOTE — Addendum Note (Signed)
Addended by: Pauletta Browns on: 12/08/2019 10:05 AM   Modules accepted: Orders

## 2019-12-08 NOTE — Addendum Note (Signed)
Addended by: Pauletta Browns on: 12/08/2019 10:06 AM   Modules accepted: Orders

## 2019-12-08 NOTE — Addendum Note (Signed)
Addended by: Pauletta Browns on: 12/08/2019 10:02 AM   Modules accepted: Orders

## 2019-12-10 ENCOUNTER — Ambulatory Visit (HOSPITAL_COMMUNITY)
Admission: RE | Admit: 2019-12-10 | Discharge: 2019-12-10 | Disposition: A | Discharge status: Home / Self Care | Payer: Medicaid Other | Source: Ambulatory Visit | Attending: Internal Medicine | Admitting: Internal Medicine

## 2019-12-10 ENCOUNTER — Other Ambulatory Visit: Payer: Self-pay

## 2019-12-10 ENCOUNTER — Other Ambulatory Visit: Payer: Self-pay | Admitting: Family Medicine

## 2019-12-10 ENCOUNTER — Encounter (HOSPITAL_COMMUNITY): Payer: Self-pay | Admitting: Internal Medicine

## 2019-12-10 ENCOUNTER — Other Ambulatory Visit (INDEPENDENT_AMBULATORY_CARE_PROVIDER_SITE_OTHER): Payer: Medicaid Other

## 2019-12-10 VITALS — BP 110/76 | HR 90 | Wt 294.2 lb

## 2019-12-10 DIAGNOSIS — Z7902 Long term (current) use of antithrombotics/antiplatelets: Secondary | ICD-10-CM | POA: Diagnosis not present

## 2019-12-10 DIAGNOSIS — O99211 Obesity complicating pregnancy, first trimester: Secondary | ICD-10-CM | POA: Insufficient documentation

## 2019-12-10 DIAGNOSIS — E039 Hypothyroidism, unspecified: Secondary | ICD-10-CM | POA: Diagnosis not present

## 2019-12-10 DIAGNOSIS — I110 Hypertensive heart disease with heart failure: Secondary | ICD-10-CM | POA: Insufficient documentation

## 2019-12-10 DIAGNOSIS — E1165 Type 2 diabetes mellitus with hyperglycemia: Secondary | ICD-10-CM | POA: Diagnosis not present

## 2019-12-10 DIAGNOSIS — Z79899 Other long term (current) drug therapy: Secondary | ICD-10-CM | POA: Insufficient documentation

## 2019-12-10 DIAGNOSIS — Z955 Presence of coronary angioplasty implant and graft: Secondary | ICD-10-CM | POA: Insufficient documentation

## 2019-12-10 DIAGNOSIS — I429 Cardiomyopathy, unspecified: Secondary | ICD-10-CM | POA: Insufficient documentation

## 2019-12-10 DIAGNOSIS — I2510 Atherosclerotic heart disease of native coronary artery without angina pectoris: Secondary | ICD-10-CM | POA: Diagnosis not present

## 2019-12-10 DIAGNOSIS — O24111 Pre-existing diabetes mellitus, type 2, in pregnancy, first trimester: Secondary | ICD-10-CM | POA: Diagnosis not present

## 2019-12-10 DIAGNOSIS — Z7982 Long term (current) use of aspirin: Secondary | ICD-10-CM | POA: Insufficient documentation

## 2019-12-10 DIAGNOSIS — Z794 Long term (current) use of insulin: Secondary | ICD-10-CM | POA: Insufficient documentation

## 2019-12-10 DIAGNOSIS — I252 Old myocardial infarction: Secondary | ICD-10-CM | POA: Insufficient documentation

## 2019-12-10 DIAGNOSIS — E785 Hyperlipidemia, unspecified: Secondary | ICD-10-CM | POA: Diagnosis not present

## 2019-12-10 DIAGNOSIS — O99281 Endocrine, nutritional and metabolic diseases complicating pregnancy, first trimester: Secondary | ICD-10-CM | POA: Diagnosis not present

## 2019-12-10 DIAGNOSIS — O10011 Pre-existing essential hypertension complicating pregnancy, first trimester: Secondary | ICD-10-CM | POA: Diagnosis not present

## 2019-12-10 DIAGNOSIS — O99411 Diseases of the circulatory system complicating pregnancy, first trimester: Secondary | ICD-10-CM | POA: Insufficient documentation

## 2019-12-10 DIAGNOSIS — I5031 Acute diastolic (congestive) heart failure: Secondary | ICD-10-CM | POA: Diagnosis not present

## 2019-12-10 DIAGNOSIS — N390 Urinary tract infection, site not specified: Secondary | ICD-10-CM | POA: Diagnosis not present

## 2019-12-10 DIAGNOSIS — Z3A09 9 weeks gestation of pregnancy: Secondary | ICD-10-CM | POA: Diagnosis not present

## 2019-12-10 LAB — POCT UA - MICROSCOPIC ONLY: Epithelial cells, urine per micros: >20

## 2019-12-10 LAB — POCT URINALYSIS DIP (MANUAL ENTRY)
Bilirubin, UA: NEGATIVE
Glucose, UA: 100 mg/dL — AB
Ketones, POC UA: NEGATIVE mg/dL
Leukocytes, UA: NEGATIVE
Nitrite, UA: POSITIVE — AB
Protein Ur, POC: >=300 mg/dL — AB
Spec Grav, UA: >=1.03 — AB (ref 1.010–1.025)
Urobilinogen, UA: 0.2 E.U./dL
pH, UA: 5.5 (ref 5.0–8.0)

## 2019-12-10 LAB — BASIC METABOLIC PANEL
Anion gap: 12 (ref 5–15)
BUN: 16 mg/dL (ref 6–20)
CO2: 19 mmol/L — ABNORMAL LOW (ref 22–32)
Calcium: 9 mg/dL (ref 8.9–10.3)
Chloride: 102 mmol/L (ref 98–111)
Creatinine, Ser: 0.79 mg/dL (ref 0.44–1.00)
GFR calc Af Amer: >60 mL/min (ref >60)
GFR calc non Af Amer: >60 mL/min (ref >60)
Glucose, Bld: 230 mg/dL — ABNORMAL HIGH (ref 70–99)
Potassium: 3.4 mmol/L — ABNORMAL LOW (ref 3.5–5.1)
Sodium: 133 mmol/L — ABNORMAL LOW (ref 135–145)

## 2019-12-10 LAB — BRAIN NATRIURETIC PEPTIDE: B Natriuretic Peptide: 194.7 pg/mL — ABNORMAL HIGH (ref 0.0–100.0)

## 2019-12-10 MED ORDER — CEPHALEXIN 500 MG PO CAPS: 500.0000 mg | ORAL_CAPSULE | Freq: Three times a day (TID) | ORAL | 0 refills | Status: AC

## 2019-12-10 MED ORDER — POTASSIUM CHLORIDE CRYS ER 20 MEQ PO TBCR: 40.0000 meq | EXTENDED_RELEASE_TABLET | Freq: Every day | ORAL | 3 refills | Status: DC

## 2019-12-10 NOTE — Progress Notes (Signed)
UA consistent with UTI. Keflex prescribed, tid for 5 days.

## 2019-12-10 NOTE — Patient Instructions (Signed)
Start Potassium (Klor-Con) 40 meq (2 tabs) daily  Take extra Furosmied AS NEEDED  Labs done today, your results will be available in MyChart, we will contact you for abnormal readings.  Your physician recommends that you schedule a follow-up appointment in: 1 month with Virtual Visit  Your physician recommends that you schedule a follow-up appointment in: 2 months with echocardiogram  If you have any questions or concerns before your next appointment please send [place] a message through [employer] or call our office at [phone].  At the [facility], you and your health needs are our priority. As part of our continuing mission to provide you with exceptional heart care, we have created designated Provider Care Teams. These Care Teams include your primary Cardiologist (physician) and Advanced Practice Providers (APPs- Physician Assistants and Nurse Practitioners) who all work together to provide you with the care you need, when you need it.   You may see any of the following providers on your designated Care Team at your next follow up: Marland Kitchen Dr Glori Bickers . Dr Loralie Champagne . Darrick Grinder, NP . Lyda Jester, PA . Audry Riles, PharmD   Please be sure to bring in all your medications bottles to every appointment.
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
## 2019-12-11 ENCOUNTER — Encounter: Payer: Medicaid Other | Attending: Obstetrics and Gynecology | Admitting: Dietician

## 2019-12-11 ENCOUNTER — Encounter: Payer: Self-pay | Admitting: Dietician

## 2019-12-11 ENCOUNTER — Other Ambulatory Visit: Payer: Self-pay

## 2019-12-11 DIAGNOSIS — E119 Type 2 diabetes mellitus without complications: Secondary | ICD-10-CM | POA: Insufficient documentation

## 2019-12-11 NOTE — Progress Notes (Signed)
Diabetes Self-Management Education  Visit Type: First/Initial  12/11/2019  [name], identified by name and date of birth, is a 34 y.o. 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   ASSESSMENT  Last menstrual period 10/05/2019. There is no height or weight on file to calculate BMI.   Patient states she has had diabetes for more than a year. States she checks her blood sugar about 3 times per day- fasting, when gets home from work (~1pm), and at night before bed. Typical meal pattern is 2 meals per day and maybe a snack, states she currently does not have an appetite (related to pregnancy) and many foods currently make her sick.   Diabetes Self-Management Education - 12/11/19 1207      Visit Information   Visit Type  First/Initial      Initial Visit   Diabetes Type  Type 2   type 2 diabetes in pregnancy   Are you currently following a meal plan?  No    Are you taking your medications as prescribed?  Yes    Date Diagnosed  "years ago"      Health Coping   How would you rate your overall health?  Fair      Psychosocial Assessment   Patient Belief/Attitude about Diabetes  Motivated to manage diabetes    Self-care barriers  Unable to determine    Special Needs  None    Preferred Learning Style  No preference indicated    Learning Readiness  Ready    How often do you need to have someone help you when you read instructions, pamphlets, or other written materials from your doctor or pharmacy?  1 - Never    What is the last grade level you completed in school?  23FT      Complications   Last HgB A1C per patient/outside source  9.1 %   11/24/2019   How often do you check your blood sugar?  3-4 times/day    Fasting Blood glucose range (mg/dL)  130-179    Postprandial Blood glucose range (mg/dL)  130-179;180-200;>200      Dietary Intake   Breakfast  [employer] sausage egg & cheese biscuit + pink lemonade    Snack (morning)  -    Lunch  -     Snack (afternoon)  -    Dinner  salad + hamburger helper   or mac & cheese and greens   Snack (evening)  -    Beverage(s)  water, gatorade or gatorade zero, sweet tea, Crystal Light      Exercise   Exercise Type  ADL's    How many days per week to you exercise?  0    How many minutes per day do you exercise?  0    Total minutes per week of exercise  0      Patient Education   Previous Diabetes Education  Yes (please comment)   "years ago"   Disease state   Definition of diabetes, type 1 and 2, and the diagnosis of diabetes;Explored patient's options for treatment of their diabetes    Nutrition management   Role of diet in the treatment of diabetes and the relationship between the three main macronutrients and blood glucose level;Food label reading, portion sizes and measuring food.;Carbohydrate counting;Reviewed blood glucose goals for pre and post meals and how to evaluate the patients' food intake on their blood glucose level.    Physical activity and exercise   Role  of exercise on diabetes management, blood pressure control and cardiac health.;Helped patient identify appropriate exercises in relation to his/her diabetes, diabetes complications and other health issue.    Monitoring  Taught/evaluated SMBG meter.;Purpose and frequency of SMBG.;Taught/discussed recording of test results and interpretation of SMBG.;Interpreting lab values - A1C, lipid, urine microalbumina.;Identified appropriate SMBG and/or A1C goals.    Acute complications  Discussed and identified patients' treatment of hyperglycemia.;Taught treatment of hypoglycemia - the 15 rule.    Preconception care  Reviewed with patient blood glucose goals with pregnancy      Individualized Goals (developed by patient)   Nutrition  Follow meal plan discussed   2-4 carb choices per meal; 0-2 carb choices per snack; include protein with all meals and snacks; limit added sugars, especially sugary beverages   Monitoring   test my blood  glucose as discussed       Individualized Plan for Diabetes Self-Management Training:   Learning Objective:  Patient will have a greater understanding of diabetes self-management. Patient education plan is to attend individual and/or group sessions per assessed needs and concerns.   Plan:  Aim for 2-4 carb choices per meal   Aim for 0-2 carb choices per snack   Try to include a source of protein with all meals and snacks  Limit added sugars as much as possible, especially from sugary beverages   Education material provided: Nutrition, Diabetes, & Pregnancy (Type 2 Diabetes & Pregnancy version, not GDM)  If problems or questions, patient to contact team via:  Phone and Email

## 2019-12-11 NOTE — Patient Instructions (Signed)
   Aim for 2-4 carb choices per meal   Aim for 0-2 carb choices per snack   Try to include a source of protein with all meals and snacks  Limit added sugars as much as possible, especially from sugary beverages

## 2019-12-14 ENCOUNTER — Telehealth: Payer: Self-pay | Admitting: Internal Medicine

## 2019-12-14 LAB — URINE CULTURE, OB REFLEX

## 2019-12-14 LAB — CULTURE, OB URINE

## 2019-12-14 NOTE — Telephone Encounter (Signed)
Patient has already had hospital follow up/heart failure clinic consult on 4/8

## 2019-12-14 NOTE — Telephone Encounter (Signed)
TCM....  Patient is recently discharged      They are scheduled to see Dr. Debara Pickett on 4/22 at 2 pm

## 2019-12-14 NOTE — Telephone Encounter (Signed)
-----   Message from Clarisse Gouge sent at 12/11/2019 11:27 AM EDT ----- Regarding: FW: tcm Lmov  ----- Message ----- From: Clarisse Gouge Sent: 12/01/2019   2:05 PM EDT To: Rebeca Alert [facility] Scheduling Subject: Delcie Roch to schedule .    ----- Message ----- From: Valora Corporal, RN Sent: 12/01/2019  11:54 AM EDT To: Clarisse Gouge  This is one for Dr. Debara Pickett can you assist with scheduling?  Thanks ----- Message ----- From: Minna Merritts, MD Sent: 11/29/2019  12:59 PM EDT To: Valora Corporal, RN  I think she has follow-up with Dr. Debara Pickett after recent discharge Can we confirm appointment Can we also confirm her weight is stable that she has access to her medications and fluid is not coming back Thx TG ----- Message ----- From: Pixie Casino, MD Sent: 11/26/2019   5:58 PM EDT To: Minna Merritts, MD  Thanks Tim - complicated situation.  -[place] ----- Message ----- From: Minna Merritts, MD Sent: 11/26/2019   4:10 PM EDT To: Pixie Casino, MD, Alisa Graff, FNP  Hey [place],  Had the opportunity to meet your patient [name] Being discharged today from [facility] Presented with worsening shortness of breath, hypertensive heart failure Several liters diuresed, mild medication changes for blood pressure management She may need close follow-up to ensure medication compliance Has a appointment scheduled with CHF clinic [facility] beginning of April 2021 Thx TG

## 2019-12-21 ENCOUNTER — Encounter: Payer: Medicaid Other | Admitting: Family Medicine

## 2019-12-21 ENCOUNTER — Telehealth: Payer: Self-pay | Admitting: Family Medicine

## 2019-12-21 NOTE — Telephone Encounter (Signed)
Attempted to contact patient to get her rescheduled for her missed ob appointment. No answer, number stated that the call could not be completed at this time. mychart message sent

## 2019-12-23 ENCOUNTER — Other Ambulatory Visit: Payer: Self-pay

## 2019-12-23 ENCOUNTER — Ambulatory Visit (INDEPENDENT_AMBULATORY_CARE_PROVIDER_SITE_OTHER): Payer: Medicaid Other | Admitting: Obstetrics and Gynecology

## 2019-12-23 ENCOUNTER — Encounter: Payer: Self-pay | Admitting: Obstetrics and Gynecology

## 2019-12-23 VITALS — BP 148/84 | HR 96 | Wt 296.9 lb

## 2019-12-23 DIAGNOSIS — Z8619 Personal history of other infectious and parasitic diseases: Secondary | ICD-10-CM

## 2019-12-23 DIAGNOSIS — O34219 Maternal care for unspecified type scar from previous cesarean delivery: Secondary | ICD-10-CM

## 2019-12-23 DIAGNOSIS — I519 Heart disease, unspecified: Secondary | ICD-10-CM

## 2019-12-23 DIAGNOSIS — O0990 Supervision of high risk pregnancy, unspecified, unspecified trimester: Secondary | ICD-10-CM

## 2019-12-23 DIAGNOSIS — O99211 Obesity complicating pregnancy, first trimester: Secondary | ICD-10-CM

## 2019-12-23 DIAGNOSIS — O24111 Pre-existing diabetes mellitus, type 2, in pregnancy, first trimester: Secondary | ICD-10-CM

## 2019-12-23 DIAGNOSIS — E119 Type 2 diabetes mellitus without complications: Secondary | ICD-10-CM

## 2019-12-23 DIAGNOSIS — Z98891 History of uterine scar from previous surgery: Secondary | ICD-10-CM

## 2019-12-23 DIAGNOSIS — O99411 Diseases of the circulatory system complicating pregnancy, first trimester: Secondary | ICD-10-CM

## 2019-12-23 DIAGNOSIS — Z3A10 10 weeks gestation of pregnancy: Secondary | ICD-10-CM

## 2019-12-23 LAB — POCT URINALYSIS DIP (DEVICE)
Bilirubin Urine: NEGATIVE
Glucose, UA: NEGATIVE mg/dL
Ketones, ur: NEGATIVE mg/dL
Leukocytes,Ua: NEGATIVE
Nitrite: NEGATIVE
Protein, ur: >=300 mg/dL — AB
Specific Gravity, Urine: >=1.03 (ref 1.005–1.030)
Urobilinogen, UA: 1 mg/dL (ref 0.0–1.0)
pH: 6 (ref 5.0–8.0)

## 2019-12-23 NOTE — Progress Notes (Signed)
PRENATAL VISIT NOTE  Subjective:  Lauren Delgado is a 34 y.o. G2P1001 at [redacted]w[redacted]d being seen today for ongoing prenatal care.  She is currently monitored for the following issues for this high-risk pregnancy and has Polycystic ovaries; Morbid obesity ([place]); History of cesarean section; Type 2 diabetes mellitus without complication ([place]); NSTEMI (non-ST elevated myocardial infarction) [facility]); ACS (acute coronary syndrome) ([place]); Hyperlipidemia associated with type 2 diabetes mellitus ([place]); Abdominal obesity and metabolic syndrome; Cardiomyopathy ([place]); Status post coronary artery stent placement; Coronary artery disease; Hypertension; Lipoma of right lower extremity; Healthcare maintenance; Sleep apnea; History of herpes genitalis; Supervision of high risk pregnancy, antepartum; Proteinuria affecting pregnancy, antepartum; Hyperglycemia due to type 2 diabetes mellitus ([place]); Flash pulmonary edema ([place]); Acute respiratory failure with hypoxia ([place]); Acute pulmonary edema ([place]); Acute on chronic diastolic CHF (congestive heart failure) ([place]); Hypertensive crisis; and Cardiac disease in mother affecting pregnancy in first trimester on their problem list.  Patient reports no complaints.  Contractions: Not present. Vag. Bleeding: None.   . Denies leaking of fluid.   The following portions of the patient's history were reviewed and updated as appropriate: allergies, current medications, past family history, past medical history, past social history, past surgical history and problem list.   Objective:   Vitals:   12/23/19 1520 12/23/19 1542  BP: (!) 168/98 (!) 148/84  Pulse: (!) 103 96  Weight: 296 lb 14.4 oz (134.7 kg)     Fetal Status: Fetal Heart Rate (bpm): + on sono        Pt informed that the ultrasound is considered a limited OB ultrasound and is not intended to be a complete ultrasound exam.  Patient also informed that the ultrasound is not being completed with the intent of assessing for  fetal or placental anomalies or any pelvic abnormalities.  Explained that the purpose of today's ultrasound is to assess for  viability.  Patient acknowledges the purpose of the exam and the limitations of the study.  Viable fetus seen moving on ultrasound with positive heart rate   General:  Alert, oriented and cooperative. Patient is in no acute distress.  Skin: Skin is warm and dry. No rash noted.   Cardiovascular: Normal heart rate noted  Respiratory: Normal respiratory effort, no problems with respiration noted  Abdomen: Soft, gravid, appropriate for gestational age.  Pain/Pressure: Absent     Pelvic: Cervical exam deferred        Extremities: Normal range of motion.  Edema: Mild pitting, slight indentation  Mental Status: Normal mood and affect. Normal behavior. Normal judgment and thought content.   Assessment and Plan:  Pregnancy: G2P1001 at [redacted]w[redacted]d 1. Supervision of high risk pregnancy, antepartum Patient is doing well without complaints  2. History of herpes genitalis Prophylaxis at 36 weeks  3. Type 2 diabetes mellitus without complication, unspecified whether long term insulin use ([place]) Patient did not bring CBG log. Patient was seen by diabetes educator and was instructed Patient to follow up in 2 weeks for CBG review Continue Lantus at current dose for now Follow up MFM scan 5/6  4. History of cesarean section   5. Cardiac disease in mother affecting pregnancy in first trimester See by cardiologist with plans for echo in June No adjustment to medication Patient did not take Hydralazine today as she ran out. Patient plans to pick it up on her way home  6. Morbid obesity ([place]) Continue ASA  Preterm labor symptoms and general obstetric precautions including but not limited to vaginal bleeding,  contractions, leaking of fluid and fetal movement were reviewed in detail with the patient. Please refer to After Visit Summary for other counseling recommendations.   Return  in about 2 weeks (around 01/06/2020) for in person, ROB, High risk .  Future Appointments  Date Time Provider [place]  12/24/2019  2:00 PM Pixie Casino, MD [facility]  01/07/2020 10:15 AM [place] MFC-US  01/07/2020 10:15 AM WH-MFC [place] 4 WH-MFCUS MFC-US  01/07/2020 11:00 AM [place]ak[place] MD RM WH-MFC MFC-US  01/11/2020  3:00 PM Bensimhon, Shaune Pascal, MD MC-HVSC None  02/17/2020 11:00 AM MC ECHO OP 1 MC-ECHOLAB [facility]  02/17/2020 12:00 PM Bensimhon, Shaune Pascal, MD MC-[facility]SC None    Mora Bellman, MD

## 2019-12-24 ENCOUNTER — Ambulatory Visit: Payer: Medicaid Other | Admitting: Internal Medicine

## 2019-12-29 ENCOUNTER — Encounter: Payer: Self-pay | Admitting: Family Medicine

## 2020-01-07 ENCOUNTER — Ambulatory Visit: Payer: Medicaid Other | Admitting: Maternal & Fetal Medicine

## 2020-01-07 ENCOUNTER — Other Ambulatory Visit: Payer: Medicaid Other
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
## 2020-01-08 ENCOUNTER — Ambulatory Visit: Payer: Medicaid Other

## 2020-01-08 ENCOUNTER — Ambulatory Visit: Payer: Medicaid Other | Admitting: *Deleted

## 2020-01-08 ENCOUNTER — Ambulatory Visit: Payer: Medicaid Other | Attending: Obstetrics and Gynecology

## 2020-01-08 ENCOUNTER — Other Ambulatory Visit: Payer: Self-pay | Admitting: Obstetrics & Gynecology

## 2020-01-08 ENCOUNTER — Other Ambulatory Visit: Payer: Self-pay

## 2020-01-08 ENCOUNTER — Other Ambulatory Visit: Payer: Self-pay | Admitting: *Deleted

## 2020-01-08 VITALS — BP 123/68 | HR 90 | Temp 97.8°F

## 2020-01-08 DIAGNOSIS — O09291 Supervision of pregnancy with other poor reproductive or obstetric history, first trimester: Secondary | ICD-10-CM | POA: Diagnosis not present

## 2020-01-08 DIAGNOSIS — O24111 Pre-existing diabetes mellitus, type 2, in pregnancy, first trimester: Secondary | ICD-10-CM | POA: Diagnosis not present

## 2020-01-08 DIAGNOSIS — O99211 Obesity complicating pregnancy, first trimester: Secondary | ICD-10-CM

## 2020-01-08 DIAGNOSIS — O0990 Supervision of high risk pregnancy, unspecified, unspecified trimester: Secondary | ICD-10-CM

## 2020-01-08 DIAGNOSIS — Z3A13 13 weeks gestation of pregnancy: Secondary | ICD-10-CM | POA: Diagnosis not present

## 2020-01-08 DIAGNOSIS — O34219 Maternal care for unspecified type scar from previous cesarean delivery: Secondary | ICD-10-CM

## 2020-01-08 DIAGNOSIS — O10011 Pre-existing essential hypertension complicating pregnancy, first trimester: Secondary | ICD-10-CM | POA: Diagnosis not present

## 2020-01-08 DIAGNOSIS — I519 Heart disease, unspecified: Secondary | ICD-10-CM

## 2020-01-08 DIAGNOSIS — O24311 Unspecified pre-existing diabetes mellitus in pregnancy, first trimester: Secondary | ICD-10-CM

## 2020-01-08 DIAGNOSIS — O10911 Unspecified pre-existing hypertension complicating pregnancy, first trimester: Secondary | ICD-10-CM | POA: Diagnosis not present

## 2020-01-08 DIAGNOSIS — O1210 Gestational proteinuria, unspecified trimester: Secondary | ICD-10-CM | POA: Diagnosis not present

## 2020-01-08 DIAGNOSIS — E669 Obesity, unspecified: Secondary | ICD-10-CM | POA: Diagnosis not present

## 2020-01-08 DIAGNOSIS — O99411 Diseases of the circulatory system complicating pregnancy, first trimester: Secondary | ICD-10-CM

## 2020-01-08 DIAGNOSIS — Z3682 Encounter for antenatal screening for nuchal translucency: Secondary | ICD-10-CM | POA: Diagnosis not present

## 2020-01-08 DIAGNOSIS — O10919 Unspecified pre-existing hypertension complicating pregnancy, unspecified trimester: Secondary | ICD-10-CM
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
## 2020-01-11 ENCOUNTER — Other Ambulatory Visit: Payer: Self-pay

## 2020-01-11 ENCOUNTER — Ambulatory Visit (HOSPITAL_COMMUNITY)
Admission: RE | Admit: 2020-01-11 | Discharge: 2020-01-11 | Disposition: A | Discharge status: Home / Self Care | Payer: Medicaid Other | Source: Ambulatory Visit | Attending: Internal Medicine | Admitting: Internal Medicine

## 2020-01-11 DIAGNOSIS — I2510 Atherosclerotic heart disease of native coronary artery without angina pectoris: Secondary | ICD-10-CM
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
## 2020-01-12 ENCOUNTER — Encounter: Payer: Self-pay | Admitting: Family Medicine

## 2020-01-12 ENCOUNTER — Telehealth (INDEPENDENT_AMBULATORY_CARE_PROVIDER_SITE_OTHER): Payer: Medicaid Other | Admitting: Family Medicine

## 2020-01-12 ENCOUNTER — Other Ambulatory Visit: Payer: Self-pay

## 2020-01-12 ENCOUNTER — Ambulatory Visit (INDEPENDENT_AMBULATORY_CARE_PROVIDER_SITE_OTHER): Payer: Medicaid Other | Admitting: Family Medicine

## 2020-01-12 VITALS — BP 118/62 | HR 101 | Wt 289.0 lb

## 2020-01-12 DIAGNOSIS — R05 Cough: Secondary | ICD-10-CM | POA: Diagnosis not present

## 2020-01-12 DIAGNOSIS — O219 Vomiting of pregnancy, unspecified: Secondary | ICD-10-CM | POA: Diagnosis not present

## 2020-01-12 DIAGNOSIS — R112 Nausea with vomiting, unspecified: Secondary | ICD-10-CM

## 2020-01-12 DIAGNOSIS — R059 Cough, unspecified: Secondary | ICD-10-CM

## 2020-01-12 MED ORDER — DIPHENHYDRAMINE HCL 25 MG PO TABS: 25.0000 mg | ORAL_TABLET | Freq: Four times a day (QID) | ORAL | 1 refills | Status: DC | PRN

## 2020-01-12 NOTE — Progress Notes (Signed)
    SUBJECTIVE:   CHIEF COMPLAINT / HPI:   Nausea & Vomiting in Pregnancy Patient is [redacted] weeks pregnant and presents for intractable nausea and vomiting. She states for the past month she has been unable to keep anything down and has nausea leading to vomiting after almost each meal. Vomit is NBNB. She is vomiting both solids and liquids with no difficulty swallowing. She did not have this during her first pregnancy. She has been taking Diclegis before each meal along with ginger and states it is not helping. She is having associated epigastric pain that radiates to her sides and a mostly dry cough. Sometimes she has productive cough with clear, whitish phlegm. She has had the cough and abdominal pain shortly after the N/V. She has had feet swelling  For the past week. She has SOB on walking but no at rest and not using extra pillows to sleep. She has no chest pain today and has not had any since her pregnancy started. No fully body itching or rash. No fevers, sometimes has chills.  PERTINENT  PMH / PSH: ACS, CHF, HTN, Hx of NSTEMI, T2DM, HLD  OBJECTIVE:   BP 118/62   Pulse (!) 101   Wt 289 lb (131.1 kg)   LMP 10/05/2019   SpO2 98%   BMI 52.86 kg/m   Gen: NAD Cardio: RRR, no murmurs Resp: CTAB no crackles no wheezing Extremities: +1 - +2 pitting edema in both feet  ASSESSMENT/PLAN:   Cough Most likely her cough is due to irritation of the esophagus due to constant N/V. I am more concerned with possible dehydration and looking to rule out other causes such as pancreatitis, cholecystitis, and gastritis as sources. Also, given her cardiac history I need to rule out a heart failure exacerbation - CBC w/Diff, CMP,   Nausea and vomiting in pregnancy Will stop Diclegis and start Benadryl 25mg  every 6 hours as needed. Recommended she take it scheduled before meals for the first few days and then go to as needed. - Follow up in 2 weeks - BMP indicates some dehydration and AKI. Will check with  patient to increase fluid intake but have to be careful with history of heart failure.     Nuala Alpha, [place]

## 2020-01-12 NOTE — Assessment & Plan Note (Signed)
Most likely her cough is due to irritation of the esophagus due to constant N/V. I am more concerned with possible dehydration and looking to rule out other causes such as pancreatitis, cholecystitis, and gastritis as sources. Also, given her cardiac history I need to rule out a heart failure exacerbation - CBC w/Diff, CMP,

## 2020-01-12 NOTE — Progress Notes (Signed)
No vitals taken at home.  .Shubh Chiara R Somtochukwu Woollard, CMA  

## 2020-01-12 NOTE — Patient Instructions (Signed)
It was great to meet you today! Thank you for letting me participate in your care!  Today, we discussed your continued nausea and vomiting. I am sorry this has been an ongoing issue for you. Please stop taking diclegis. I have started you on benadryl for your nausea and vomiting. Please take it every 6 hours as needed. I recommend taking if before meals since this seems to make you more nauseated.  I have also ordered some labs today to rule out other causes of your symptoms. If anything is abnormal I will call you.  Be well, Harolyn Rutherford, DO PGY-3, [facility]

## 2020-01-12 NOTE — Progress Notes (Signed)
[place] Telemedicine Visit  Patient consented to have virtual visit and was identified by name and date of birth. Method of visit: Video  Encounter participants: Patient: Lauren Delgado - located at Home Provider: Danna Hefty - located at [facility] Others (if applicable): Female family member  Chief Complaint: Vomiting, Cough  HPI: Patient notes she's had a cough since March, this is a persistent cough that occurs constantly. She notes it is both wet and dry cough. She coughs up clear/yellow phlegm occasionally. She feels like its getting worse. She notes that she is coughing so much that she is vomiting. She is feeling nauseated as well. She ate some saltine crackers this morning that she vomited up, the vomit looks like what she eats/drinks. Denies any hematemesis. She is urinating normally. She notes she has not had a bowel movement in 2 weeks. She had a COVID test on 11/24/19 that was negative. She notes that the vomiting IS in association with the coughing.   She endorses chills, but denies fevers. She endorses right upper quadrant abdominal pain that seems worse when she coughs and throws up. Denies any rhinorrhea, congestion, ear pain, sinus pain, sore throat, SOB.   She endorses compliance with her CHF medications but she notes that half the time she vomits it back up. Her legs are "about to bust". She was hospitalized for SOB secondary to CHF exacerbation in March and notes that her symptoms improved (cough, swelling) when she was treated with IV lasix. She has not been able to check her body weight to see how much weight she has gained.  She has been using cough drops to help with the cough.   Patient notes her entire household including herself was tested for COVID last week and was negative.   ROS: per HPI  Pertinent PMHx: Currently pregnant at [redacted]w[redacted]d, HTN, IDDM, morbid obesity, HFpEF with EF 55-60% and G2DD normal wall motion and valves, CAD with  h/o NSTEMI and 3 vessel stenting in 2019, OSA   Exam:  LMP 10/05/2019   Gen: Appears tired Respiratory: Breathing comfortably on room air, coughed a few times during exam that sounded slightly wet   Assessment/Plan:  Cough Chronic but acutely worsening cough with post-tussive NBNB emesis in the setting of a very complex medical history including current pregnancy at [redacted]w[redacted]d, HFpEF with most recent exacerbation in March 2021 requiring IV lasix, morbid obesity, OSA, CAD with h/o NSTEMI with 3 vessel stenting in 2019, and insulin dependent diabetes. Suspect her symptoms are multifactorial. Volume overload is high on the differential given report of LE edema and inconsistent use of her medications. She has not been monitoring her weight. Infection is lower on differential given chronicity of her symptoms and lack of fever. Per patient, she was tested for COVID last week and was negative. Medications have been reviewed and do not appear to be contributing. Patient did endorse some intermittent RUQ abdominal pain that may be contributing to her vomiting, however less consistent with her cough. Other differentials considered include post-nasal drip vs GERD vs hyperemesis gravidarum. Lack of bowel movement for two weeks is also concerning in the setting of her vomiting, however she confirmed that the emesis only occurs in relation to her cough which is reassuring for bowel obstruction.  Given complexity of patient, I believe patient should be evaluated in person to have a formal physical exam in order to determine appropriate labs/imaging.  Could consider increasing Lasix dose for fluid retention and symptomatic  treatment of her cough/vomiting (Robitussin, honey, mucinex) to allow for better adherence to medications. If indicated, consider CXR, CBC, and CMP to rule out other causes of her acute on chronic cough/vomiting.  Patient discussed with Dr. Andria Frames who agreed with above plan.  Patient scheduled for  in-person evaluated for today at 2:50 pm with Dr. Garlan Fillers.  Of note, patient has follow up with high-risk OB with Dr. Kennon Rounds on 01/13/20  Time spent during visit with patient: 42 minutes  Mina Marble, [place], PGY2 01/12/2020 1:17 PM

## 2020-01-12 NOTE — Assessment & Plan Note (Addendum)
Chronic but acutely worsening cough with post-tussive NBNB emesis in the setting of a very complex medical history including current pregnancy at [redacted]w[redacted]d, HFpEF with most recent exacerbation in March 2021 requiring IV lasix, morbid obesity, OSA, CAD with h/o NSTEMI with 3 vessel stenting in 2019, and insulin dependent diabetes. Suspect her symptoms are multifactorial. Volume overload is high on the differential given report of LE edema and inconsistent use of her medications. She has not been monitoring her weight. Infection is lower on differential given chronicity of her symptoms and lack of fever. Per patient, she was tested for COVID last week and was negative. Medications have been reviewed and do not appear to be contributing. Patient did endorse some intermittent RUQ abdominal pain that may be contributing to her vomiting, however less consistent with her cough. Other differentials considered include post-nasal drip vs GERD vs hyperemesis gravidarum. Lack of bowel movement for two weeks is also concerning in the setting of her vomiting, however she confirmed that the emesis only occurs in relation to her cough which is reassuring for bowel obstruction.  Given complexity of patient, I believe patient should be evaluated in person to have a formal physical exam in order to determine appropriate labs/imaging.  Could consider increasing Lasix dose for fluid retention and symptomatic treatment of her cough/vomiting (Robitussin, honey, mucinex) to allow for better adherence to medications. If indicated, consider CXR, CBC, and CMP to rule out other causes of her acute on chronic cough/vomiting.  Patient discussed with Dr. Andria Frames who agreed with above plan.

## 2020-01-13 ENCOUNTER — Ambulatory Visit (INDEPENDENT_AMBULATORY_CARE_PROVIDER_SITE_OTHER): Payer: Medicaid Other | Admitting: Family Medicine

## 2020-01-13 VITALS — BP 123/86 | HR 98 | Wt 286.0 lb

## 2020-01-13 DIAGNOSIS — O99411 Diseases of the circulatory system complicating pregnancy, first trimester: Secondary | ICD-10-CM

## 2020-01-13 DIAGNOSIS — O10019 Pre-existing essential hypertension complicating pregnancy, unspecified trimester: Secondary | ICD-10-CM

## 2020-01-13 DIAGNOSIS — O10011 Pre-existing essential hypertension complicating pregnancy, first trimester: Secondary | ICD-10-CM

## 2020-01-13 DIAGNOSIS — O24111 Pre-existing diabetes mellitus, type 2, in pregnancy, first trimester: Secondary | ICD-10-CM

## 2020-01-13 DIAGNOSIS — R059 Cough, unspecified: Secondary | ICD-10-CM

## 2020-01-13 DIAGNOSIS — O24119 Pre-existing diabetes mellitus, type 2, in pregnancy, unspecified trimester: Secondary | ICD-10-CM | POA: Insufficient documentation

## 2020-01-13 DIAGNOSIS — I519 Heart disease, unspecified: Secondary | ICD-10-CM

## 2020-01-13 DIAGNOSIS — Z22338 Carrier of other streptococcus: Secondary | ICD-10-CM | POA: Insufficient documentation

## 2020-01-13 DIAGNOSIS — O169 Unspecified maternal hypertension, unspecified trimester: Secondary | ICD-10-CM | POA: Insufficient documentation

## 2020-01-13 DIAGNOSIS — O99351 Diseases of the nervous system complicating pregnancy, first trimester: Secondary | ICD-10-CM

## 2020-01-13 DIAGNOSIS — I2510 Atherosclerotic heart disease of native coronary artery without angina pectoris: Secondary | ICD-10-CM

## 2020-01-13 DIAGNOSIS — O1210 Gestational proteinuria, unspecified trimester: Secondary | ICD-10-CM

## 2020-01-13 DIAGNOSIS — G4730 Sleep apnea, unspecified: Secondary | ICD-10-CM

## 2020-01-13 DIAGNOSIS — O34219 Maternal care for unspecified type scar from previous cesarean delivery: Secondary | ICD-10-CM

## 2020-01-13 DIAGNOSIS — Z3482 Encounter for supervision of other normal pregnancy, second trimester: Secondary | ICD-10-CM | POA: Diagnosis not present

## 2020-01-13 DIAGNOSIS — O0990 Supervision of high risk pregnancy, unspecified, unspecified trimester: Secondary | ICD-10-CM

## 2020-01-13 DIAGNOSIS — Z98891 History of uterine scar from previous surgery: Secondary | ICD-10-CM

## 2020-01-13 DIAGNOSIS — Z3A13 13 weeks gestation of pregnancy: Secondary | ICD-10-CM

## 2020-01-13 DIAGNOSIS — Z8619 Personal history of other infectious and parasitic diseases: Secondary | ICD-10-CM

## 2020-01-13 DIAGNOSIS — O219 Vomiting of pregnancy, unspecified: Secondary | ICD-10-CM | POA: Insufficient documentation

## 2020-01-13 DIAGNOSIS — E119 Type 2 diabetes mellitus without complications: Secondary | ICD-10-CM

## 2020-01-13 DIAGNOSIS — R05 Cough: Secondary | ICD-10-CM

## 2020-01-13 DIAGNOSIS — O1211 Gestational proteinuria, first trimester: Secondary | ICD-10-CM

## 2020-01-13 MED ORDER — PANTOPRAZOLE SODIUM 20 MG PO TBEC: 20.0000 mg | DELAYED_RELEASE_TABLET | Freq: Every day | ORAL | 1 refills | Status: DC

## 2020-01-13 MED ORDER — FUROSEMIDE 20 MG PO TABS: 20.0000 mg | ORAL_TABLET | Freq: Two times a day (BID) | ORAL | 2 refills | Status: DC

## 2020-01-13 MED ORDER — CETIRIZINE HCL 10 MG PO TABS: 10.0000 mg | ORAL_TABLET | Freq: Every day | ORAL | 1 refills | Status: DC

## 2020-01-13 NOTE — Assessment & Plan Note (Addendum)
Will stop Diclegis and start Benadryl 25mg  every 6 hours as needed. Recommended she take it scheduled before meals for the first few days and then go to as needed. Do not suspect heart failure or infectious process ongoing.  - Follow up in 2 weeks - BMP indicates some dehydration and AKI. Will check with patient to increase fluid intake but have to be careful with history of heart failure. - BNP still pending

## 2020-01-13 NOTE — Progress Notes (Addendum)
PRENATAL VISIT NOTE  Subjective:  Lauren Delgado is a 34 y.o. G2P1001 at [redacted]w[redacted]d being seen today for ongoing prenatal care.  She is currently monitored for the following issues for this high-risk pregnancy and has Polycystic ovaries; Morbid obesity ([place]); History of cesarean section; Type 2 diabetes mellitus without complication ([place]); NSTEMI (non-ST elevated myocardial infarction) [facility]); ACS (acute coronary syndrome) ([place]); Hyperlipidemia associated with type 2 diabetes mellitus ([place]); Abdominal obesity and metabolic syndrome; Cardiomyopathy ([place]); Status post coronary artery stent placement; Coronary artery disease; Hypertension; Lipoma of right lower extremity; Healthcare maintenance; Sleep apnea; History of herpes genitalis; Supervision of high risk pregnancy, antepartum; Proteinuria affecting pregnancy, antepartum; Hyperglycemia due to type 2 diabetes mellitus ([place]); Acute on chronic diastolic CHF (congestive heart failure) ([place]); Hypertensive crisis; Cardiac disease in mother affecting pregnancy in first trimester; Cough; 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  Patient reports persistent cough with on-going post-tussive emesis. W/u yesterday wtih PCP and he placed her on Benadryl..  Contractions: Not present. Vag. Bleeding: None.  Movement: Absent. Denies leaking of fluid.   The following portions of the patient's history were reviewed and updated as appropriate: allergies, current medications, past family history, past medical history, past social history, past surgical history and problem list.   Objective:   Vitals:   01/13/20 0938  BP: 123/86  Pulse: 98  Weight: 286 lb (129.7 kg)    Fetal Status: Fetal Heart Rate (bpm): + on sono   Movement: Absent     General:  Alert, oriented and cooperative. Patient is in no acute distress.  Skin:  Skin is warm and dry. No rash noted.   Cardiovascular: Normal heart rate noted  Respiratory: Normal respiratory effort, no problems with respiration noted  Abdomen: Soft, gravid, appropriate for gestational age.  Pain/Pressure: Absent     Pelvic: Cervical exam deferred        Extremities: Normal range of motion.  Edema: Moderate pitting, indentation subsides rapidly  Mental Status: Normal mood and affect. Normal behavior. Normal judgment and thought content.   Assessment and Plan:  Pregnancy: G2P1001 at [redacted]w[redacted]d 1. Cardiac disease in mother affecting pregnancy in first trimester Given cough, on-going emesis, will attempt to increase lasix in case this is contributing. - furosemide (LASIX) 20 MG tablet; Take 1 tablet (20 mg total) by mouth 2 (two) times daily.  Dispense: 60 tablet; Refill: 2  2. Coronary artery disease involving native heart without angina pectoris, unspecified vessel or lesion type No CP at present Continue Imdur ASA  3. Sleep apnea, unspecified type   4. History of cesarean section Desires RCS with BTL  5. History of herpes genitalis Will need PPX at 36 wks  6. Proteinuria affecting pregnancy, antepartum Due to DM long-standing Last Serum Cr 1.1--> slightly increased and possibly related to emesis--which is post-tussive, and she is down 10 lbs since early pregnancy.  7. Supervision of high risk pregnancy, antepartum NIPT today--discussed possibility of poor fetal fraction - Genetic Screening  8. Type 2 diabetes mellitus in pregnancy, unspecified trimester No book, no sugars and no insulin x 1 wk, due to increasing emesis, related to on-going cough and post-tussive emesis. CBG yesterday with PCP was 314. Risks of poorly controlled DM and pregnancy discussed at length. Change to 3 meals, 3 snacks, 10-15 u of Novolog with meals and take at least 20 u Lantus, even if vomiting. Added to BabyScripts  DM platform and advised to check CBGs so that on-going insulin  adjustments can be made.  9. Pre-existing essential hypertension during pregnancy, antepartum On labetalol and Procardia and ASA BP is ok today.  10. 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 Cough Due to cough and possibility of GERD, PND due to pollen (has allergies), will attempt a multi-modal approach with then peeling back of meds to see which helps. Also with profound anemia, likely not helping her heart or swelling, will arrange for IV iron infusion--risks reviewed. Orders placed. - pantoprazole (PROTONIX) 20 MG tablet; Take 1 tablet (20 mg total) by mouth daily.  Dispense: 90 tablet; Refill: 1 - cetirizine (ZYRTEC ALLERGY) 10 MG tablet; Take 1 tablet (10 mg total) by mouth daily.  Dispense: 30 tablet; Refill: 1 - furosemide (LASIX) 20 MG tablet; Take 1 tablet (20 mg total) by mouth 2 (two) times daily.  Dispense: 60 tablet; Refill: 2  General obstetric precautions including but not limited to vaginal bleeding, contractions, leaking of fluid and fetal movement were reviewed in detail with the patient. Please refer to After Visit Summary for other counseling recommendations.   Return in 2 weeks (on 01/27/2020) for IV iron infusion.  Future Appointments  Date Time Provider [place]  01/14/2020 10:15 AM [facility]  01/21/2020  3:15 PM Caren Macadam, MD [facility]  02/15/2020 12:45 PM [facility] NURSE [facility]  02/15/2020 12:45 PM [facility] US4 [facility]  02/17/2020 11:00 AM MC ECHO OP 1 [facility]  02/17/2020 12:00 PM Bensimhon, Shaune Pascal, MD [facility] None    Donnamae Jude, MD

## 2020-01-13 NOTE — Patient Instructions (Signed)

## 2020-01-13 NOTE — BH Specialist Note (Signed)
Integrated Behavioral Health via Telemedicine Video Visit  01/13/2020 DASHONNA ZAMBELLI DR:[id number]  Number of [place] visits: 2 Session Start time: 10:23  Session End time: 10:46  Total time: 23  Referring Provider: Arlina Robes, MD Type of Visit: Video Patient/Family location: Home [facility] Provider location: [facility] [place] [facility]  All persons participating in visit: Patient Lavinia Kasal and [place]   Confirmed patient's address: Yes  Confirmed patient's phone number: Yes  Any changes to demographics: No   Confirmed patient's insurance: Yes  Any changes to patient's insurance: No   Discussed confidentiality: at previous visit  I connected with [name]  by a video enabled telemedicine application and verified that I am speaking with the correct person using two identifiers.     I discussed the limitations of evaluation and management by telemedicine and the availability of in person appointments.  I discussed that the purpose of this visit is to provide behavioral health care while limiting exposure to the novel coronavirus.   Discussed there is a possibility of technology failure and discussed alternative modes of communication if that failure occurs.  I discussed that engaging in this video visit, they consent to the provision of behavioral healthcare and the services will be billed under their insurance.  Patient and/or legal guardian expressed understanding and consented to video visit: Yes   PRESENTING CONCERNS: Patient and/or family reports the following symptoms/concerns: Pt states her primary concern today is financial stress (paying rent and utilities) after being out of work due to cough that is preventing her from sleeping; plans to pick up allergy meds today; no current SI. Pt looks forward to going back to work; attributes increase in symptoms to feeling physically unwell.  Duration  of problem: One month; Severity of problem: severe  STRENGTHS (Protective Factors/Coping Skills): Supportive family  GOALS ADDRESSED: Patient will: 1.  Reduce symptoms of: anxiety, depression and stress  2.  Increase knowledge and/or ability of: healthy habits  3.  Demonstrate ability to: Increase healthy adjustment to current life circumstances and Increase adequate support systems for patient/family  INTERVENTIONS: Interventions utilized:  Solution-Focused Strategies and Link to [employer] Standardized Assessments completed: Pt given PHQ9/GAD7 in past two weeks  ASSESSMENT: Patient currently experiencing Adjustment disorder with mixed anxiety and depression and Psychosocial stress.   Patient may benefit from continued psychoeducation and brief therapeutic interventions regarding coping with symptoms of depression, anxiety and life stress .  PLAN: 1. Follow up with behavioral health clinician on : One month. Call Jaiana Sheffer at [phone] if needed prior to that time.  2. Behavioral recommendations:  -Pick up allergy medication today and begin taking as prescribed -Continue taking prenatal vitamin at night -Sign [id number] Consent for referrals -Make sure Voicemail is set up and not full, to receive call-backs from referrals for possible help with utilities and rent assistance -Be familiar with [facility] resources (sent to After Visit Summary) including crisis numbers, if SI begins 3. Referral(s): [place] (In Clinic) and [employer] Resources:  utility/rent assistance  I discussed the assessment and treatment plan with the patient and/or parent/guardian. They were provided an opportunity to ask questions and all were answered. They agreed with the plan and demonstrated an understanding of the instructions.   They were advised to call back or seek an in-person evaluation if the symptoms worsen or if the condition fails to improve as anticipated.  Caroleen Hamman  [facility]  Depression screen [facility] 2/9  01/13/2020 01/12/2020 12/23/2019 11/18/2019 11/13/2019  Decreased Interest 3 3 3 2  0  Down, Depressed, Hopeless 3 3 1 2  0  PHQ - 2 Score 6 6 4 4  0  Altered sleeping 3 3 1 2  -  Tired, decreased energy 3 3 3 2  -  Change in appetite 3 3 1 2  -  Feeling bad or failure about yourself  1 1 1 2  -  Trouble concentrating 1 1 0 2 -  Moving slowly or fidgety/restless 1 1 1 1  -  Suicidal thoughts 1 1 0 1 -  PHQ-9 Score 19 19 11 16  -   GAD 7 : Generalized Anxiety Score 01/13/2020 12/23/2019 11/18/2019  Nervous, Anxious, on Edge 3 1 1   Control/stop worrying 3 1 2   Worry too much - different things 3 1 2   Trouble relaxing 3 1 2   Restless 1 1 1   Easily annoyed or irritable 3 1 -  Afraid - awful might happen 1 1 1   Total GAD 7 Score 17 7 -

## 2020-01-13 NOTE — Progress Notes (Signed)
Fetal movement visualized on bedside ultrasound, unable to get heart tones

## 2020-01-13 NOTE — Addendum Note (Signed)
Addended by: Donnamae Jude on: 01/13/2020 04:09 PM   Modules accepted: Orders

## 2020-01-14 ENCOUNTER — Other Ambulatory Visit: Payer: Self-pay | Admitting: Family Medicine

## 2020-01-14 ENCOUNTER — Ambulatory Visit (INDEPENDENT_AMBULATORY_CARE_PROVIDER_SITE_OTHER): Payer: Medicaid Other | Admitting: Clinical

## 2020-01-14 ENCOUNTER — Ambulatory Visit: Payer: Medicaid Other | Admitting: Internal Medicine

## 2020-01-14 ENCOUNTER — Other Ambulatory Visit: Payer: Self-pay

## 2020-01-14 DIAGNOSIS — Z658 Other specified problems related to psychosocial circumstances: Secondary | ICD-10-CM

## 2020-01-14 DIAGNOSIS — F4323 Adjustment disorder with mixed anxiety and depressed mood: Secondary | ICD-10-CM | POA: Diagnosis not present

## 2020-01-14 LAB — LIPASE: Lipase: 59 U/L (ref 14–72)

## 2020-01-14 LAB — COMPREHENSIVE METABOLIC PANEL
ALT: 17 IU/L (ref 0–32)
AST: 18 IU/L (ref 0–40)
Albumin/Globulin Ratio: 1 — ABNORMAL LOW (ref 1.2–2.2)
Albumin: 2.8 g/dL — ABNORMAL LOW (ref 3.8–4.8)
Alkaline Phosphatase: 60 IU/L (ref 39–117)
BUN/Creatinine Ratio: 14 (ref 9–23)
BUN: 16 mg/dL (ref 6–20)
Bilirubin Total: <0.2 mg/dL (ref 0.0–1.2)
CO2: 18 mmol/L — ABNORMAL LOW (ref 20–29)
Calcium: 7.8 mg/dL — ABNORMAL LOW (ref 8.7–10.2)
Chloride: 100 mmol/L (ref 96–106)
Creatinine, Ser: 1.18 mg/dL — ABNORMAL HIGH (ref 0.57–1.00)
GFR calc Af Amer: 71 mL/min/{1.73_m2} (ref >59)
GFR calc non Af Amer: 61 mL/min/{1.73_m2} (ref >59)
Globulin, Total: 2.9 g/dL (ref 1.5–4.5)
Glucose: 314 mg/dL — ABNORMAL HIGH (ref 65–99)
Potassium: 3.5 mmol/L (ref 3.5–5.2)
Sodium: 134 mmol/L (ref 134–144)
Total Protein: 5.7 g/dL — ABNORMAL LOW (ref 6.0–8.5)

## 2020-01-14 LAB — CBC WITH DIFFERENTIAL/PLATELET
Basophils Absolute: 0 10*3/uL (ref 0.0–0.2)
Basos: 0 %
EOS (ABSOLUTE): 0 10*3/uL (ref 0.0–0.4)
Eos: 0 %
Hematocrit: 24.7 % — ABNORMAL LOW (ref 34.0–46.6)
Hemoglobin: 8.3 g/dL — ABNORMAL LOW (ref 11.1–15.9)
Immature Grans (Abs): 0 10*3/uL (ref 0.0–0.1)
Immature Granulocytes: 0 %
Lymphocytes Absolute: 1.2 10*3/uL (ref 0.7–3.1)
Lymphs: 22 %
MCH: 29.7 pg (ref 26.6–33.0)
MCHC: 33.6 g/dL (ref 31.5–35.7)
MCV: 89 fL (ref 79–97)
Monocytes Absolute: 0.6 10*3/uL (ref 0.1–0.9)
Monocytes: 11 %
Neutrophils Absolute: 3.5 10*3/uL (ref 1.4–7.0)
Neutrophils: 67 %
Platelets: 252 10*3/uL (ref 150–450)
RBC: 2.79 x10E6/uL — ABNORMAL LOW (ref 3.77–5.28)
RDW: 12.4 % (ref 11.7–15.4)
WBC: 5.3 10*3/uL (ref 3.4–10.8)

## 2020-01-14 LAB — BRAIN NATRIURETIC PEPTIDE: BNP: 141.7 pg/mL — ABNORMAL HIGH (ref 0.0–100.0)

## 2020-01-14 MED ORDER — ONDANSETRON HCL 4 MG PO TABS: 4.0000 mg | ORAL_TABLET | Freq: Three times a day (TID) | ORAL | 0 refills | Status: DC | PRN

## 2020-01-14 NOTE — Patient Instructions (Signed)
Behavioral Health Resources:   What if I or someone I know is in crisis?  . If you are thinking about harming yourself or having thoughts of suicide, or if you know someone who is, seek help right away.  . Call your doctor or mental health care provider.  . Call 911 or go to a hospital emergency room to get immediate help, or ask a friend or family member to help you do these things.  . Call the [place] National Suicide Prevention Lifeline's toll-free, 24-hour hotline at 1-800-273-TALK (1-800-273-8255) or TTY: 1-800-799-4 TTY (1-800-799-4889) to talk to a trained counselor.  . If you are in crisis, make sure you are not left alone.   . If someone else is in crisis, make sure he or she is not left alone   24 Hour :   [place] National Suicide Hotline: 1-800-273-8255  Therapeutic Alternative Mobile Crisis: 1-877-626-1772   [facility]  [address], [place], [place] [address]  800-711-2635 or 336-832-9700  [facility] Crisis Line (Domestic Violence, Rape & Victim Assistance)  336-273-7273  [facility]  [address] [place], [place]  [address]   1-855-788-8787 or 336-676-6840   [facility] [place] [facility] Services: 336-899-1505 (8am-4pm) or 1-866- 261-5769 (after hours)           [facility], [address], [place], [place]  1-800-711-2635 Fax: 336-832-9701 [email or web address]  *Interpreters available *Accepts Medicaid, Medicare, uninsured  [facility]   Mon-Fri: 8am-5pm [address], [place], [place] [phone](phone); 336-272-9885(fax) www.[email or web address]  *Accepts Medicare  [facility] Mon, Tues, Thurs, Fri: 8am-4pm [address], [place], [place]  336-334-5000 (phone); 336-256-0121 (fax) www.[email or web address]  *Accepts Medicare  [facility] Mon-Fri: 9am-5pm  [address], [place], [place] 336-540-9400 (phone); 336-540-9454  www.[email or web address]  *Accepts Medicaid  [facility] [address], [place], [place]  336-271-5888  [email or web address]://[email or web address]   [facility] Mon-Fri, 8:30am-12pm/1pm-2:30pm [address], [place], [place] [phone] (phone); 336-387-9167 (fax) www.fspcares.org  *Accepts Medicaid, sliding-scale*Bilingual services available  [facility] Mon-Fri, 8am-7pm [address], [place], [place]  336-899-8800(phone); 336-899-8811(fax) www.[email or web address]  *Accepts Medicaid *Bilingual services available  [facility] Mon-Fri: 8am-5pm, Saturday by appointment only [address], [place], [place] [phone] (phone); 336-292-6711 (fax) www.[email or web address]   [facility] [address], [place], [place] [phone] www.[email or web address]  *Free & reduced services for uninsured and underinsured individuals *Bilingual services for Spanish-speaking clients 21 and under  [facility], [address], [place], [place] 336-676-6409(phone); 336-676-6409(fax) www.[facility].org  *Bring your own interpreter at first visit *Accepts Medicare and Medicaid  [facility] Mon-Fri: 9am-5:30pm [address], [place], [place] [phone] (phone), [phone] (fax) After hours crisis line: 336-763-1165 www.[email or web address]  *Accepts Medicare and Medicaid  [facility] Mon-Thurs, 8am-6pm [address], , [place]  [phone] (phone); 336-288-0738 (fax) http://[email or web address]  *Subsidized costs available  [facility] Mon-Fri: 8am-4pm [address], ,  [place] [phone](phone); 336-834-9698(fax) www.p[email or web address]  *Accepts Medicaid  [facility] Same day access hours: Mon-Fri, 8:30-3pm Crisis hours: Mon-Fri, 8am-5pm [address], [place], [place]  [facility] Same day access hours: Mon-Fri, 8:30-3pm Crisis hours: Mon-Fri, 8am-8pm [address], [place],  [phone] (phone); 336-899-1513 (fax) www.[email or web address]  *Accepts Medicaid and Medicare  [facility] Mon, Wed, Fri: 9am-9pm Tues, Thurs: 9am-6pm [address],   [place], [place]  [phone] (phone); [phone] (fax) [email or web address]://[email or web address]  *(Accepts Medicare and Medicaid; payment plans available)*Bilingual services available  [facility] [address], [place], [place] [phone] (phone); 336-272-1182 (fax) www.[email or web address]   [facility] [address], [place], [place]  [phone]  www.[email or web address]  *Bilingual services available  [facility] Mon-Thurs: 8am-8pm [address], [place], [place] [phone] (phone); [phone] (fax) [email or web address]  *Accepts Medicaid*Bilingual services available  [facility] [address], [place], [place] [phone] (phone) [email or web address]://[email or web address]  *Accepts Medicaid *Bilingual services available  [facility] Mon-Fri, 9am-4:45pm [address], [place], [place] [phone] (phone); [phone] (fax) [email or web address]://[email or web address]  *Accepts Medicare  [facility] Mon-Thurs: 8:30-8pm, Fri: 8:30am-7pm [address], [place], [place] (3rd floor) [phone] (phone); [phone] (fax) [email or web address]://[email or web address]  *Accepts Medicaid; income-based reduced rates available  [facility] Mon-Fri: 8am-5pm [address], [place],  [place] [phone] (phone); [phone] (fax) [email or web address]://[email or web address]  *Accepts Medicaid*Bilingual services available  [facility] [address], [place],   [phone] (phone); [phone] (fax) www.[email or web address]  *Free emergency housing and clinical services for youth in crisis  [facility] ([facility] [place])  [address], [place] 336-373-1402 www.mhag.org  *Provides direct services to individuals in recovery from mental illness, including support groups, recovery skills classes, and one on one peer support  NAMI (National Alliance on Mental Illness) [place] NAMI helpline: 336-370-4264  https://namiguilford.org  *A community hub for information relating to local resources and services for the friends and families of individuals living alongside a mental health condition, as well as the individuals themselves. Classes and support groups also provided     

## 2020-01-14 NOTE — Progress Notes (Signed)
Called patient to inform her she appears to be dehydrated with borderline AKI. Encouraged to drink water. Benadryl not working, will try Zofran.   She has follow up with OB, discussed return precautions for dehydration.

## 2020-01-15 ENCOUNTER — Telehealth: Payer: Self-pay

## 2020-01-15 NOTE — Telephone Encounter (Signed)
Called Short Stay to schedule iron infusion appts x2 per Kennon Rounds, MD. Short Stay dept unavailable at this time. Appt needs to be scheduled and pt notified.

## 2020-01-18 ENCOUNTER — Telehealth: Payer: Self-pay | Admitting: Lactation Services

## 2020-01-18 NOTE — Telephone Encounter (Signed)
Faraheme infusion scheduled for 5/27 at 8 am at Short Stay at [facility].   Called patient to inform her of results and appointment. LM for patient to call the office for results and appointment.   My Chart message sent.

## 2020-01-18 NOTE — Telephone Encounter (Signed)
Addressed in another encounter

## 2020-01-18 NOTE — Telephone Encounter (Signed)
Per chart review, pt has read the MyChart message regarding appt for [facility] infusion.

## 2020-01-21 ENCOUNTER — Ambulatory Visit (INDEPENDENT_AMBULATORY_CARE_PROVIDER_SITE_OTHER): Payer: Medicaid Other | Admitting: Family Medicine

## 2020-01-21 ENCOUNTER — Encounter: Payer: Self-pay | Admitting: Family Medicine

## 2020-01-21 ENCOUNTER — Encounter: Payer: Self-pay | Admitting: Lactation Services

## 2020-01-21 ENCOUNTER — Other Ambulatory Visit: Payer: Self-pay

## 2020-01-21 VITALS — BP 132/93 | HR 105 | Wt 288.7 lb

## 2020-01-21 DIAGNOSIS — O24119 Pre-existing diabetes mellitus, type 2, in pregnancy, unspecified trimester: Secondary | ICD-10-CM

## 2020-01-21 DIAGNOSIS — O10012 Pre-existing essential hypertension complicating pregnancy, second trimester: Secondary | ICD-10-CM

## 2020-01-21 DIAGNOSIS — O10019 Pre-existing essential hypertension complicating pregnancy, unspecified trimester: Secondary | ICD-10-CM

## 2020-01-21 DIAGNOSIS — E119 Type 2 diabetes mellitus without complications: Secondary | ICD-10-CM

## 2020-01-21 DIAGNOSIS — O99412 Diseases of the circulatory system complicating pregnancy, second trimester: Secondary | ICD-10-CM

## 2020-01-21 DIAGNOSIS — I519 Heart disease, unspecified: Secondary | ICD-10-CM

## 2020-01-21 DIAGNOSIS — O0990 Supervision of high risk pregnancy, unspecified, unspecified trimester: Secondary | ICD-10-CM

## 2020-01-21 DIAGNOSIS — Z3A15 15 weeks gestation of pregnancy: Secondary | ICD-10-CM

## 2020-01-21 DIAGNOSIS — O24112 Pre-existing diabetes mellitus, type 2, in pregnancy, second trimester: Secondary | ICD-10-CM

## 2020-01-21 NOTE — Progress Notes (Unsigned)
Insufficient Fetal DNA on Panorama Screening. Reviewed with Dr. Kennon Rounds. Will offer/obtain Quad Screen at today's Visit. Appointment notes edited.

## 2020-01-21 NOTE — Progress Notes (Signed)
Pt states still coughing & vomiting, Protonix & Zofran works a little. Pt also states that she keeps getting light headed, this has been going on for the last couple of days.

## 2020-01-21 NOTE — Progress Notes (Signed)
PRENATAL VISIT NOTE  Subjective:  Lauren Delgado is a 34 y.o. G2P1001 at [redacted]w[redacted]d being seen today for ongoing prenatal care.  She is currently monitored for the following issues for this high-risk pregnancy and has Polycystic ovaries; Morbid obesity ([place]); History of cesarean section; Type 2 diabetes mellitus without complication ([place]); NSTEMI (non-ST elevated myocardial infarction) [facility]); ACS (acute coronary syndrome) ([place]); Hyperlipidemia associated with type 2 diabetes mellitus ([place]); Abdominal obesity and metabolic syndrome; Cardiomyopathy ([place]); Status post coronary artery stent placement; Coronary artery disease; Hypertension; Lipoma of right lower extremity; Healthcare maintenance; Sleep apnea; History of herpes genitalis; Supervision of high risk pregnancy, antepartum; Proteinuria affecting pregnancy, antepartum; Hyperglycemia due to type 2 diabetes mellitus ([place]); Acute on chronic diastolic CHF (congestive heart failure) ([place]); Hypertensive crisis; Cardiac disease in mother affecting pregnancy in first trimester; Cough; 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  Patient reports no complaints.  Contractions: Not present. Vag. Bleeding: None.  Movement: Present. Denies leaking of fluid.    Since last visit:  Of note--> patient makes little eye contact and mostly engaged with her phone during the visit. Flat affect is clearly present.   T2DM: Not regularly checking her sugars. She has two BG logged in babyscripts and these were fasting. She reported BS 152 and 139. She is not using her lantus and when she does she takes it, takes it in the morning. She has only used Lantus  4 days in the last week and has not used novolog at all. She eat only once a day   Coughing in AM and at night- Lasix did not help SOB, feet swelling improved. She continues to  have coughing that leads to vomiting. Worse at night. Has started the GERD meds and   SOB: Knows about fe infusion appt.     The following portions of the patient's history were reviewed and updated as appropriate: allergies, current medications, past family history, past medical history, past social history, past surgical history and problem list.   Objective:   Vitals:   01/21/20 1616  BP: (!) 132/93  Pulse: (!) 105  Weight: 288 lb 11.2 oz (131 kg)    Fetal Status: Fetal Heart Rate (bpm): +on sono Fundal Height: 15 cm Movement: Present     General:  Alert, oriented and cooperative. Patient is in no acute distress.  Skin: Skin is warm and dry. No rash noted.   Cardiovascular: Normal heart rate noted  Respiratory: Normal respiratory effort, no problems with respiration noted  Abdomen: Soft, gravid, appropriate for gestational age.  Pain/Pressure: Absent     Pelvic: Cervical exam deferred        Extremities: Normal range of motion.  Edema: Trace  Mental Status: Normal mood and affect. Normal behavior. Normal judgment and thought content.   Assessment and Plan:  Pregnancy: G2P1001 at [redacted]w[redacted]d 1. Type 2 diabetes mellitus in pregnancy, unspecified trimester Non- compliant/adherent Discussed importance of checking sugars and taking basal insulin We discussed making limited changes at a time to simplify and hopefully encourage better adherence  Changes: 1) Check at least fasting daily- log in babyscripts 2) Take Lantus 15 unit daily at night  She agrees to this change and provided teach back and understanding. Consider regular HA1C and POC glucose if non-adherence continues Although remote from delivery, may need earlier delivery due to hyperglycemia.   2. Pre-existing essential hypertension during pregnancy, antepartum  BP stable. DBP slightly above 90 but do not feel med dose change needed    3. Cardiac disease in mother affecting pregnancy in first trimester Case is reviewed  regularly through Red Chart and has interdisciplinary follow up Has Fe infusion scheduled 5/27 -Continue lasix 20mg  BID as this has helped with LE swelling  4. Supervision of high risk pregnancy, antepartum NIPT drawn 5/12- expect results in 2-3 weks Hold off on Quad until next visit  Has MFM [place] scheduled  5. SOB - Continue protonix  - Continue cetirizine - Planning for Fe infusion to help sx - If worsening counseled to call office, might need more aggressive diuresis if thought to be cardiac related at that time.   6. Flat affect - Has IBH involved in care. Appreciate coordinated care.   - Continue to try to engage patient in care plan. Concerned about multiple chronic medical conditions which are often co-morbid with depression that contributes to non-adherence. - Last PHQ9 19 and has had persistently elevated values this pregnancy.  - consider discussing SSRI like zoloft in the future. Did not discuss with patient today due to need to address other medical conditions. I think this is value to discuss at future visits. There does seem to be some adjustment d/o with mood being related to lack of work/financial stressors.   7. Dizziness - likely multifactorial in nature with severe anemia likely contributing as well as not eating regularly and profound hyperglycemia. Also could be related to hydration status in the setting of diuresis. Unless BP relate as BP is not low enough to cause this sx.  - Encouraged to monitor sx - Discussed eating 5 meals daily instead of just one - Reviewed good water intake  - Reviewed importance of fe infusion.   Preterm labor symptoms and general obstetric precautions including but not limited to vaginal bleeding, contractions, leaking of fluid and fetal movement were reviewed in detail with the patient. Please refer to After Visit Summary for other counseling recommendations.   Return in about 1 week (around 01/28/2020) for Routine prenatal care, glucose  log review.  Future Appointments  Date Time Provider [name]  01/28/2020  8:00 AM [facility]-MD[facility] ROOM 11 [facility] None  01/29/2020  8:15 AM Donnamae Jude, MD [facility]  02/11/2020 10:15 AM [facility]-BEHAVIORAL HEALTH CLINICIAN [facility]  02/15/2020 12:45 PM W[facility]-MFC NURSE [facility]  02/15/2020 12:45 PM W[facility]-MFC US4 W[facility]-MFCUS [facility]  02/15/2020  1:50 PM W[facility]-WOCA LAB [facility]  02/17/2020 11:00 AM MC ECHO OP 1 MC-ECHOLAB [facility]  02/17/2020 12:00 PM Bensimhon, Shaune Pascal, MD MC-HVSC None    Caren Macadam, MD

## 2020-01-21 NOTE — Progress Notes (Deleted)
BS 152 and 139- fasting sugars Used the Lantus only 4 days,  Not used novolog.  Coughing in AM and at night- Lasix did not help SOB, feet swelling improved.  Having Fe infusion

## 2020-01-22 ENCOUNTER — Telehealth: Payer: Self-pay | Admitting: Internal Medicine

## 2020-01-22 NOTE — Telephone Encounter (Signed)
Contacted patient 01/22/20, no answer, left message

## 2020-01-27 ENCOUNTER — Telehealth: Payer: Self-pay | Admitting: Clinical

## 2020-01-27 NOTE — Discharge Instructions (Signed)

## 2020-01-27 NOTE — Telephone Encounter (Signed)
Follow up with pt: Left HIPPA-compliant message to call back Roselyn Reef from [facility] [employer] at [facility] at  [phone] [facility] office).

## 2020-01-28 ENCOUNTER — Other Ambulatory Visit: Payer: Self-pay

## 2020-01-28 ENCOUNTER — Encounter (HOSPITAL_COMMUNITY)
Admission: RE | Admit: 2020-01-28 | Discharge: 2020-01-28 | Disposition: A | Discharge status: Home / Self Care | Payer: Medicaid Other | Source: Ambulatory Visit | Attending: Family Medicine | Admitting: Family Medicine

## 2020-01-28 DIAGNOSIS — I509 Heart failure, unspecified: Secondary | ICD-10-CM | POA: Insufficient documentation

## 2020-01-28 DIAGNOSIS — D649 Anemia, unspecified: Secondary | ICD-10-CM | POA: Insufficient documentation

## 2020-01-28 MED ORDER — SODIUM CHLORIDE 0.9 % IV SOLN
510.0000 mg | INTRAVENOUS | Status: DC
  Administered 2020-01-28: 510 mg via INTRAVENOUS
  Filled 2020-01-28: qty 17

## 2020-01-28 NOTE — BH Specialist Note (Deleted)
Integrated Behavioral Health via Telemedicine Video Visit  01/28/2020 BRAYDEN KIRKBY [id number]  Number of [place] visits: 3 Session Start time: 10:15***  Session End time: 10:45*** Total time: {IBH Total [id number]  Referring Provider: Arlina Robes, MD Type of Visit: Video Patient/Family location: [facility] Provider location: [facility] [place] [facility]  All persons participating in visit: Patient *** and [place] ***   Confirmed patient's address: Yes  Confirmed patient's phone number: Yes  Any changes to demographics: No   Confirmed patient's insurance: Yes  Any changes to patient's insurance: No   Discussed confidentiality: at previous visit  I connected with [name]  by a video enabled telemedicine application and verified that I am speaking with the correct person using two identifiers.     I discussed the limitations of evaluation and management by telemedicine and the availability of in person appointments.  I discussed that the purpose of this visit is to provide behavioral health care while limiting exposure to the novel coronavirus.   Discussed there is a possibility of technology failure and discussed alternative modes of communication if that failure occurs.  I discussed that engaging in this video visit, they consent to the provision of behavioral healthcare and the services will be billed under their insurance.  Patient and/or legal guardian expressed understanding and consented to video visit: Yes   PRESENTING CONCERNS: Patient and/or family reports the following symptoms/concerns: *** Duration of problem: Over one month ***; Severity of problem: {Mild/Moderate/Severe:20260}  STRENGTHS (Protective Factors/Coping Skills): Supportive family ***  GOALS ADDRESSED: Patient will: 1.  Reduce symptoms of: anxiety, depression and stress  2.  Increase knowledge and/or ability of:  {IBH Patient Tools:21014057}  3.  Demonstrate ability to: {IBH Goals:21014053}  INTERVENTIONS: Interventions utilized:  {IBH Interventions:21014054} Standardized Assessments completed: {IBH Screening Tools:21014051}  ASSESSMENT: Patient currently experiencing Adjustment disorder with mixed anxiety and depression and Psychosocial stress***.   Patient may benefit from continued psychoeducation and brief therapeutic interventions regarding coping with symptoms of *** .  PLAN: 1. Follow up with behavioral health clinician on : *** 2. Behavioral recommendations:  -*** -***(allergy meds, prenatal, nncare, vm set up, crisis numbers)*** 3. Referral(s): {IBH Referrals:21014055}  I discussed the assessment and treatment plan with the patient and/or parent/guardian. They were provided an opportunity to ask questions and all were answered. They agreed with the plan and demonstrated an understanding of the instructions.   They were advised to call back or seek an in-person evaluation if the symptoms worsen or if the condition fails to improve as anticipated.  Caroleen Hamman Lenae Wherley

## 2020-01-29 ENCOUNTER — Telehealth: Payer: Self-pay | Admitting: Family Medicine

## 2020-01-29 ENCOUNTER — Encounter: Payer: Medicaid Other | Admitting: Family Medicine

## 2020-01-29 NOTE — Telephone Encounter (Signed)
Received a message from Access Nurse that patient wanted to cancel her appointments, and transfer to another office. I called to see who she wanted to get records sent too, and she needed to sign a ROI. Left a voicemail message for her to call the office.

## 2020-02-01 ENCOUNTER — Other Ambulatory Visit: Payer: Self-pay | Admitting: Family Medicine

## 2020-02-02 ENCOUNTER — Telehealth (HOSPITAL_COMMUNITY): Payer: Self-pay | Admitting: Vascular Surgery

## 2020-02-02 ENCOUNTER — Encounter: Payer: Self-pay | Admitting: *Deleted

## 2020-02-02 NOTE — Telephone Encounter (Signed)
Left pt message to move echo and f/u appt 6/16 to a earlier appt time from 11& 12 to 9 & 10, asked pt to call back to confirm appt time change

## 2020-02-04 ENCOUNTER — Encounter (HOSPITAL_COMMUNITY)
Admission: RE | Admit: 2020-02-04 | Discharge: 2020-02-04 | Disposition: A | Discharge status: Home / Self Care | Payer: Medicaid Other | Source: Ambulatory Visit | Attending: Family Medicine | Admitting: Family Medicine

## 2020-02-04 ENCOUNTER — Telehealth: Payer: Self-pay | Admitting: Family Medicine

## 2020-02-04 ENCOUNTER — Other Ambulatory Visit: Payer: Self-pay | Admitting: Advanced Practice Midwife

## 2020-02-04 DIAGNOSIS — E1165 Type 2 diabetes mellitus with hyperglycemia: Secondary | ICD-10-CM

## 2020-02-04 DIAGNOSIS — I509 Heart failure, unspecified: Secondary | ICD-10-CM | POA: Insufficient documentation

## 2020-02-04 DIAGNOSIS — D649 Anemia, unspecified: Secondary | ICD-10-CM | POA: Diagnosis not present

## 2020-02-04 DIAGNOSIS — O24112 Pre-existing diabetes mellitus, type 2, in pregnancy, second trimester: Secondary | ICD-10-CM

## 2020-02-04 MED ORDER — SODIUM CHLORIDE 0.9 % IV SOLN
510.0000 mg | INTRAVENOUS | Status: DC
  Administered 2020-02-04: 510 mg via INTRAVENOUS
  Filled 2020-02-04: qty 17

## 2020-02-04 NOTE — Telephone Encounter (Signed)
Patient called in to cancel her diabetes education appointment.  Patient stated that she has told someone is the past that she did not want to see the diabetes educator. Per patient's request, the appointment was canceled.

## 2020-02-05 ENCOUNTER — Telehealth: Payer: Self-pay | Admitting: Lactation Services

## 2020-02-05 NOTE — Telephone Encounter (Signed)
Attempted to call patient to inform her of Diabetes Education appt. She did not answer and mailbox was full not so not able to leave a message. My Chart message sent.

## 2020-02-05 NOTE — Telephone Encounter (Signed)
-----   Message from Nelta Numbers, RN sent at 02/04/2020 12:47 PM EDT ----- Regarding: FW: DM Education  ----- Message ----- From: Rada Hay Sent: 02/04/2020  12:41 PM EDT To: [place] Clinical Pool Subject: FW: DM Education                               Please call patient with this appointment. Attempted to call but no answer. Scheduled for 6/8 @ 2:15 ----- Message ----- From: Manya Silvas, CNM Sent: 02/04/2020  12:32 PM EDT To: [facility] Admin Pool Subject: DM Education                                   Please schedule pt w/ DM educator for consult for [place], uncontrolled DM.

## 2020-02-08 NOTE — Progress Notes (Signed)
Patient with profound anemia--Hgb of 8 and h/o CHF--for IV iron infusion

## 2020-02-09 ENCOUNTER — Other Ambulatory Visit: Payer: Medicaid Other

## 2020-02-09 ENCOUNTER — Telehealth: Payer: Self-pay | Admitting: Family Medicine

## 2020-02-09 NOTE — Telephone Encounter (Signed)
Spoke with patient to confirm if she transferred her care to the [facility] office. Patient verbalized that she did. Patient instructed that I will be calling her appointments at our office. Patient verbalized understanding and appointments were canceled.

## 2020-02-11 ENCOUNTER — Ambulatory Visit (INDEPENDENT_AMBULATORY_CARE_PROVIDER_SITE_OTHER): Payer: Medicaid Other | Admitting: Family Medicine

## 2020-02-11 ENCOUNTER — Other Ambulatory Visit: Payer: Self-pay

## 2020-02-11 VITALS — BP 114/64 | HR 91 | Wt 303.8 lb

## 2020-02-11 DIAGNOSIS — Z3480 Encounter for supervision of other normal pregnancy, unspecified trimester: Secondary | ICD-10-CM

## 2020-02-11 DIAGNOSIS — E119 Type 2 diabetes mellitus without complications: Secondary | ICD-10-CM | POA: Diagnosis not present

## 2020-02-11 DIAGNOSIS — O1210 Gestational proteinuria, unspecified trimester: Secondary | ICD-10-CM | POA: Diagnosis not present

## 2020-02-11 DIAGNOSIS — D649 Anemia, unspecified: Secondary | ICD-10-CM

## 2020-02-11 DIAGNOSIS — M7989 Other specified soft tissue disorders: Secondary | ICD-10-CM | POA: Diagnosis not present

## 2020-02-11 DIAGNOSIS — O10019 Pre-existing essential hypertension complicating pregnancy, unspecified trimester: Secondary | ICD-10-CM

## 2020-02-11 DIAGNOSIS — O24119 Pre-existing diabetes mellitus, type 2, in pregnancy, unspecified trimester: Secondary | ICD-10-CM

## 2020-02-11 DIAGNOSIS — R059 Cough, unspecified: Secondary | ICD-10-CM

## 2020-02-11 DIAGNOSIS — R05 Cough: Secondary | ICD-10-CM | POA: Diagnosis not present

## 2020-02-11 LAB — POCT GLYCOSYLATED HEMOGLOBIN (HGB A1C): HbA1c, POC (controlled diabetic range): 7.8 % — AB (ref 0.0–7.0)

## 2020-02-11 NOTE — Patient Instructions (Addendum)
It was great seeing you today!  I am sorry that you have been having a leg swelling.  Fortunately I do not think it is in your lungs based on exam.  I am going to get some lab work to make sure you do not have something called preeclampsia.  I think it is a low likelihood given your pressures are good, but I have to make sure.    For your cough I think he can you increase your Protonix to 2 times a day.  I will be in touch with the results

## 2020-02-12 ENCOUNTER — Telehealth: Payer: Self-pay | Admitting: Family Medicine

## 2020-02-12 DIAGNOSIS — M7989 Other specified soft tissue disorders: Secondary | ICD-10-CM | POA: Insufficient documentation

## 2020-02-12 DIAGNOSIS — D649 Anemia, unspecified: Secondary | ICD-10-CM | POA: Insufficient documentation

## 2020-02-12 LAB — COMPREHENSIVE METABOLIC PANEL
ALT: 10 IU/L (ref 0–32)
AST: 8 IU/L (ref 0–40)
Albumin/Globulin Ratio: 1 — ABNORMAL LOW (ref 1.2–2.2)
Albumin: 2.8 g/dL — ABNORMAL LOW (ref 3.8–4.8)
Alkaline Phosphatase: 77 IU/L (ref 48–121)
BUN/Creatinine Ratio: 17 (ref 9–23)
BUN: 11 mg/dL (ref 6–20)
Bilirubin Total: 0.2 mg/dL (ref 0.0–1.2)
CO2: 20 mmol/L (ref 20–29)
Calcium: 8.3 mg/dL — ABNORMAL LOW (ref 8.7–10.2)
Chloride: 104 mmol/L (ref 96–106)
Creatinine, Ser: 0.65 mg/dL (ref 0.57–1.00)
GFR calc Af Amer: 136 mL/min/{1.73_m2} (ref >59)
GFR calc non Af Amer: 118 mL/min/{1.73_m2} (ref >59)
Globulin, Total: 2.7 g/dL (ref 1.5–4.5)
Glucose: 209 mg/dL — ABNORMAL HIGH (ref 65–99)
Potassium: 3.4 mmol/L — ABNORMAL LOW (ref 3.5–5.2)
Sodium: 138 mmol/L (ref 134–144)
Total Protein: 5.5 g/dL — ABNORMAL LOW (ref 6.0–8.5)

## 2020-02-12 LAB — CBC WITH DIFFERENTIAL/PLATELET
Basophils Absolute: 0 10*3/uL (ref 0.0–0.2)
Basos: 0 %
EOS (ABSOLUTE): 0 10*3/uL (ref 0.0–0.4)
Eos: 0 %
Hematocrit: 24.2 % — ABNORMAL LOW (ref 34.0–46.6)
Hemoglobin: 8.3 g/dL — ABNORMAL LOW (ref 11.1–15.9)
Immature Grans (Abs): 0 10*3/uL (ref 0.0–0.1)
Immature Granulocytes: 0 %
Lymphocytes Absolute: 1.4 10*3/uL (ref 0.7–3.1)
Lymphs: 15 %
MCH: 30.5 pg (ref 26.6–33.0)
MCHC: 34.3 g/dL (ref 31.5–35.7)
MCV: 89 fL (ref 79–97)
Monocytes Absolute: 0.3 10*3/uL (ref 0.1–0.9)
Monocytes: 4 %
Neutrophils Absolute: 7.1 10*3/uL — ABNORMAL HIGH (ref 1.4–7.0)
Neutrophils: 81 %
Platelets: 264 10*3/uL (ref 150–450)
RBC: 2.72 x10E6/uL — CL (ref 3.77–5.28)
RDW: 14.9 % (ref 11.7–15.4)
WBC: 8.9 10*3/uL (ref 3.4–10.8)

## 2020-02-12 LAB — PROTIME-INR
INR: 1 (ref 0.9–1.2)
Prothrombin Time: 10.6 s (ref 9.1–12.0)

## 2020-02-12 NOTE — Assessment & Plan Note (Signed)
Profound leg swelling up to mid thigh, 3+ pitting.  Has been taking her Lasix.  Per report she has been taking 40 mg twice daily, but is prescribed 20 mg twice daily.  I attempted to call patient to further clarify her dosing, but received no answer.  She would likely need to be increased depending on what she is getting now.  Does have follow-up with heart failure team on 6/15.  Will likely double her current dose anticipation of that heart failure visit.  Patient does have a history of preeclampsia, so is also on the differential for swelling.  I did draw preeclampsia labs.  Lab work without new abnormality, with protein/creatinine ratio still pending.  She does have a fairly significant anemia, which high risk OB is managing at this time.  She underwent IV iron infusion, but unfortunately does not seem to have improved her anemia.

## 2020-02-12 NOTE — Assessment & Plan Note (Signed)
Protein creatinine ratio drawn today.  Still pending.  If abnormal will discuss with high risk OB.

## 2020-02-12 NOTE — Progress Notes (Signed)
CHIEF COMPLAINT / HPI: 34 year old female who presents for diabetes checkup as well as bilateral lower extremity swelling.  Patient states that the swelling has been going on for a few weeks.  She is up around 15 pounds states that she has not changed her diet significantly, including has not been eating a lot more salty foods.  She states that she has been taking her Lasix 20 mg twice daily as prescribed.  She has follow-up with heart failure team on 02/16/2020.  The patient had an A1c checked and was 7.8.  This is improved from 9.1 at last check around 3 months ago.  Patient states that she stopped taking all of her diabetic medications including all forms of insulin.  She does have follow-up with high risk OB on 02/15/2020.  She has been compliant with all other medications per her report.  Also of note patient has a chronic cough.  She states that it seems to improve and go away when she takes her Protonix, but does return around 12 hours later.  She takes the Protonix with breakfast she is good throughout the day but does have it with dinner, but she takes it with dinner then she will have the cough in the morning with breakfast.   OBJECTIVE: BP 114/64   Pulse 91   Wt (!) 303 lb 12.8 oz (137.8 kg)   LMP 10/05/2019   BMI 55.57 kg/m   Gen: 34 year old African-American female, no acute distress CV: Regular rate and rhythm, no M/R/G Resp: Lungs clear to auscultation bilaterally, no crackles noted. Legs: 3+ pitting edema all way up to mid thigh.  Warm and dry. Neuro: Alert and oriented, Speech clear, No gross deficits OB: Fetal heart rate 143-155.  ASSESSMENT / PLAN:  Hypertension in pregnancy, antepartum 114/64 in clinic today.  Continue labetalol 200 mg 3 times daily.  Continue Procardia 60 mg daily.  Continue Imdur 90 mg daily, continue hydralazine 50 mg 3 times daily.  Leg swelling Profound leg swelling up to mid thigh, 3+ pitting.  Has been taking her Lasix.  Per report she has  been taking 40 mg twice daily, but is prescribed 20 mg twice daily.  I attempted to call patient to further clarify her dosing, but received no answer.  She would likely need to be increased depending on what she is getting now.  Does have follow-up with heart failure team on 6/15.  Will likely double her current dose anticipation of that heart failure visit.  Patient does have a history of preeclampsia, so is also on the differential for swelling.  I did draw preeclampsia labs.  Lab work without new abnormality, with protein/creatinine ratio still pending.  She does have a fairly significant anemia, which high risk OB is managing at this time.  She underwent IV iron infusion, but unfortunately does not seem to have improved her anemia.  Proteinuria affecting pregnancy, antepartum Protein creatinine ratio drawn today.  Still pending.  If abnormal will discuss with high risk OB.   Cough Patient with multiple month cough.  Most consistent with GERD at this time.  Can increase Protonix to 40 mg twice daily.  Anemia CBC back, hemoglobin 8.3.  High risk OB is managing this problem at this time.  Unchanged from last draw.  Did receive IV iron infusion, but unfortunately did not improve the hemoglobin very much.  Can follow-up with high risk OB for this issue.  Given her ACS, her transfusion threshold is 8 from my perspective.  Type 2 diabetes mellitus in pregnancy, unspecified trimester Hemoglobin A1c has improved to 7.8 from 9.1.  Patient completely stopped all of her insulin for unknown reasons.  Will defer management of this issue to high risk OB.  Would likely be a candidate for Metformin, but unfortunately patient has had bad diarrhea with Metformin in the past.     Guadalupe Dawn MD PGY-3 Family Medicine Resident [facility]

## 2020-02-12 NOTE — Assessment & Plan Note (Signed)
Patient with multiple month cough.  Most consistent with GERD at this time.  Can increase Protonix to 40 mg twice daily.

## 2020-02-12 NOTE — Assessment & Plan Note (Signed)
114/64 in clinic today.  Continue labetalol 200 mg 3 times daily.  Continue Procardia 60 mg daily.  Continue Imdur 90 mg daily, continue hydralazine 50 mg 3 times daily.

## 2020-02-12 NOTE — Assessment & Plan Note (Signed)
Hemoglobin A1c has improved to 7.8 from 9.1.  Patient completely stopped all of her insulin for unknown reasons.  Will defer management of this issue to high risk OB.  Would likely be a candidate for Metformin, but unfortunately patient has had bad diarrhea with Metformin in the past.

## 2020-02-12 NOTE — Telephone Encounter (Signed)
Attempted to reach patient via telephone to give her the lab results that are back so far.  CBC consistent with anemia, and is unchanged from previous drawing.  High risk OB managing, appears to have gotten IV iron infusion the past couple months, but this does not seem to have helped.  Protein creatinine ratio only lab still pending.  I also called to attempt to further clarify her Lasix dosing.  Patient states that she was taking it 40 mg 2 times a day during the visit, but it is prescribed 20 mg 2 times a day.  She will likely need to increase her dosing, but I would like to have clarification prior to redosing.  I will continue to attempt to try and reach her.  Guadalupe Dawn MD PGY-3 Family Medicine Resident

## 2020-02-12 NOTE — Assessment & Plan Note (Signed)
CBC back, hemoglobin 8.3.  High risk OB is managing this problem at this time.  Unchanged from last draw.  Did receive IV iron infusion, but unfortunately did not improve the hemoglobin very much.  Can follow-up with high risk OB for this issue.  Given her ACS, her transfusion threshold is 8 from my perspective.

## 2020-02-13 LAB — PROTEIN / CREATININE RATIO, URINE
Creatinine, Urine: 155.1 mg/dL
Protein, Ur: 793.2 mg/dL
Protein/Creat Ratio: 5114 mg/g creat — ABNORMAL HIGH (ref 0–200)

## 2020-02-15 ENCOUNTER — Other Ambulatory Visit: Payer: Medicaid Other

## 2020-02-15 ENCOUNTER — Ambulatory Visit: Payer: Medicaid Other | Attending: Obstetrics and Gynecology

## 2020-02-15 ENCOUNTER — Telehealth: Payer: Self-pay | Admitting: Lactation Services

## 2020-02-15 ENCOUNTER — Ambulatory Visit: Payer: Medicaid Other | Admitting: *Deleted

## 2020-02-15 ENCOUNTER — Other Ambulatory Visit: Payer: Self-pay

## 2020-02-15 ENCOUNTER — Other Ambulatory Visit: Payer: Self-pay | Admitting: *Deleted

## 2020-02-15 DIAGNOSIS — O1210 Gestational proteinuria, unspecified trimester: Secondary | ICD-10-CM | POA: Insufficient documentation

## 2020-02-15 DIAGNOSIS — O24119 Pre-existing diabetes mellitus, type 2, in pregnancy, unspecified trimester: Secondary | ICD-10-CM | POA: Diagnosis not present

## 2020-02-15 DIAGNOSIS — O09292 Supervision of pregnancy with other poor reproductive or obstetric history, second trimester: Secondary | ICD-10-CM | POA: Diagnosis not present

## 2020-02-15 DIAGNOSIS — E669 Obesity, unspecified: Secondary | ICD-10-CM

## 2020-02-15 DIAGNOSIS — O0990 Supervision of high risk pregnancy, unspecified, unspecified trimester: Secondary | ICD-10-CM

## 2020-02-15 DIAGNOSIS — O34219 Maternal care for unspecified type scar from previous cesarean delivery: Secondary | ICD-10-CM | POA: Diagnosis not present

## 2020-02-15 DIAGNOSIS — O10012 Pre-existing essential hypertension complicating pregnancy, second trimester: Secondary | ICD-10-CM | POA: Diagnosis not present

## 2020-02-15 DIAGNOSIS — O99212 Obesity complicating pregnancy, second trimester: Secondary | ICD-10-CM

## 2020-02-15 DIAGNOSIS — O24112 Pre-existing diabetes mellitus, type 2, in pregnancy, second trimester: Secondary | ICD-10-CM | POA: Diagnosis not present

## 2020-02-15 DIAGNOSIS — O10919 Unspecified pre-existing hypertension complicating pregnancy, unspecified trimester: Secondary | ICD-10-CM | POA: Insufficient documentation

## 2020-02-15 DIAGNOSIS — Z3A19 19 weeks gestation of pregnancy: Secondary | ICD-10-CM | POA: Diagnosis not present

## 2020-02-15 DIAGNOSIS — Z362 Encounter for other antenatal screening follow-up: Secondary | ICD-10-CM

## 2020-02-15 NOTE — Telephone Encounter (Signed)
Called patient to ask if she is checking her blood sugars and if she is having difficulty entering them in [employer]. LM for patient to call the office.   Patient has declined to meet with Diabetes Education recently.   Will send My Chart Message to patient.

## 2020-02-15 NOTE — Telephone Encounter (Signed)
-----   Message from Aletha Halim, MD sent at 02/12/2020  1:42 PM EDT ----- Regarding: babyscripts gdm Hi, this patient had zero baby scripts blood sugar entries over the past week. Can you call her and see if there's anything we can help her with to check her sugars more frequently? Thanks  Durene Romans MD Attending [facility] for [employer] (Faculty Practice) 02/12/2020

## 2020-02-16 ENCOUNTER — Encounter: Payer: Medicaid Other | Admitting: Obstetrics & Gynecology
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
## 2020-02-17 ENCOUNTER — Other Ambulatory Visit (HOSPITAL_COMMUNITY): Payer: Medicaid Other

## 2020-02-17 ENCOUNTER — Encounter (HOSPITAL_COMMUNITY): Payer: Self-pay | Admitting: Internal Medicine

## 2020-02-17 ENCOUNTER — Ambulatory Visit (HOSPITAL_BASED_OUTPATIENT_CLINIC_OR_DEPARTMENT_OTHER)
Admission: RE | Admit: 2020-02-17 | Discharge: 2020-02-17 | Disposition: A | Discharge status: Home / Self Care | Payer: Medicaid Other | Source: Ambulatory Visit | Attending: Internal Medicine | Admitting: Internal Medicine

## 2020-02-17 ENCOUNTER — Other Ambulatory Visit: Payer: Self-pay

## 2020-02-17 ENCOUNTER — Ambulatory Visit (HOSPITAL_COMMUNITY)
Admission: RE | Admit: 2020-02-17 | Discharge: 2020-02-17 | Disposition: A | Discharge status: Home / Self Care | Payer: Medicaid Other | Source: Ambulatory Visit | Attending: Internal Medicine | Admitting: Internal Medicine

## 2020-02-17 VITALS — BP 126/76 | HR 96 | Ht 62.0 in | Wt 302.6 lb

## 2020-02-17 DIAGNOSIS — Z833 Family history of diabetes mellitus: Secondary | ICD-10-CM | POA: Insufficient documentation

## 2020-02-17 DIAGNOSIS — O24112 Pre-existing diabetes mellitus, type 2, in pregnancy, second trimester: Secondary | ICD-10-CM | POA: Insufficient documentation

## 2020-02-17 DIAGNOSIS — I110 Hypertensive heart disease with heart failure: Secondary | ICD-10-CM | POA: Diagnosis not present

## 2020-02-17 DIAGNOSIS — Z955 Presence of coronary angioplasty implant and graft: Secondary | ICD-10-CM | POA: Insufficient documentation

## 2020-02-17 DIAGNOSIS — Z3490 Encounter for supervision of normal pregnancy, unspecified, unspecified trimester: Secondary | ICD-10-CM

## 2020-02-17 DIAGNOSIS — O99282 Endocrine, nutritional and metabolic diseases complicating pregnancy, second trimester: Secondary | ICD-10-CM | POA: Diagnosis not present

## 2020-02-17 DIAGNOSIS — O99412 Diseases of the circulatory system complicating pregnancy, second trimester: Secondary | ICD-10-CM | POA: Diagnosis not present

## 2020-02-17 DIAGNOSIS — I5032 Chronic diastolic (congestive) heart failure: Secondary | ICD-10-CM | POA: Insufficient documentation

## 2020-02-17 DIAGNOSIS — I5043 Acute on chronic combined systolic (congestive) and diastolic (congestive) heart failure: Secondary | ICD-10-CM

## 2020-02-17 DIAGNOSIS — Z8261 Family history of arthritis: Secondary | ICD-10-CM | POA: Diagnosis not present

## 2020-02-17 DIAGNOSIS — Z7982 Long term (current) use of aspirin: Secondary | ICD-10-CM | POA: Diagnosis not present

## 2020-02-17 DIAGNOSIS — E119 Type 2 diabetes mellitus without complications: Secondary | ICD-10-CM | POA: Diagnosis not present

## 2020-02-17 DIAGNOSIS — Z3A19 19 weeks gestation of pregnancy: Secondary | ICD-10-CM | POA: Insufficient documentation

## 2020-02-17 DIAGNOSIS — Z8249 Family history of ischemic heart disease and other diseases of the circulatory system: Secondary | ICD-10-CM | POA: Insufficient documentation

## 2020-02-17 DIAGNOSIS — R600 Localized edema: Secondary | ICD-10-CM | POA: Diagnosis not present

## 2020-02-17 DIAGNOSIS — I2510 Atherosclerotic heart disease of native coronary artery without angina pectoris: Secondary | ICD-10-CM | POA: Insufficient documentation

## 2020-02-17 DIAGNOSIS — I252 Old myocardial infarction: Secondary | ICD-10-CM | POA: Diagnosis not present

## 2020-02-17 DIAGNOSIS — E785 Hyperlipidemia, unspecified: Secondary | ICD-10-CM | POA: Insufficient documentation

## 2020-02-17 DIAGNOSIS — Z79899 Other long term (current) drug therapy: Secondary | ICD-10-CM | POA: Insufficient documentation

## 2020-02-17 DIAGNOSIS — O99212 Obesity complicating pregnancy, second trimester: Secondary | ICD-10-CM | POA: Insufficient documentation

## 2020-02-17 DIAGNOSIS — I429 Cardiomyopathy, unspecified: Secondary | ICD-10-CM | POA: Diagnosis not present

## 2020-02-17 LAB — BASIC METABOLIC PANEL
Anion gap: 11 (ref 5–15)
BUN: 12 mg/dL (ref 6–20)
CO2: 21 mmol/L — ABNORMAL LOW (ref 22–32)
Calcium: 8.2 mg/dL — ABNORMAL LOW (ref 8.9–10.3)
Chloride: 103 mmol/L (ref 98–111)
Creatinine, Ser: 0.77 mg/dL (ref 0.44–1.00)
GFR calc Af Amer: >60 mL/min (ref >60)
GFR calc non Af Amer: >60 mL/min (ref >60)
Glucose, Bld: 217 mg/dL — ABNORMAL HIGH (ref 70–99)
Potassium: 3.1 mmol/L — ABNORMAL LOW (ref 3.5–5.1)
Sodium: 135 mmol/L (ref 135–145)

## 2020-02-17 NOTE — Progress Notes (Signed)
  Echocardiogram 2D Echocardiogram has been performed.  Michiel Cowboy 02/17/2020, 9:44 AM

## 2020-02-17 NOTE — Patient Instructions (Signed)
Labs done today. We will contact you only if your labs are abnormal.  No medication changes were made. Please continue all current medications as prescribed.  An order for compression hoses and and donning aids were provided for you while in office today. Please wear compression hoses to help with swelling.   Your physician recommends that you schedule a follow-up appointment in: 1 month with Dr. Haroldine Laws.  If you have any questions or concerns before your next appointment please send [place] a message through [place] or call our office at [phone].    TO LEAVE A MESSAGE FOR THE NURSE SELECT OPTION 2, PLEASE LEAVE A MESSAGE INCLUDING: . YOUR NAME . DATE OF BIRTH . CALL BACK NUMBER . REASON FOR CALL**this is important as we prioritize the call backs  [place] AS LONG AS YOU CALL BEFORE 4:00 PM   At the [facility], you and your health needs are our priority. As part of our continuing mission to provide you with exceptional heart care, we have created designated Provider Care Teams. These Care Teams include your primary Cardiologist (physician) and Advanced Practice Providers (APPs- Physician Assistants and Nurse Practitioners) who all work together to provide you with the care you need, when you need it.   You may see any of the following providers on your designated Care Team at your next follow up: Marland Kitchen Dr Glori Bickers . Dr Loralie Champagne . Darrick Grinder, NP . Lyda Jester, PA . Audry Riles, PharmD   Please be sure to bring in all your medications bottles to every appointment.

## 2020-02-17 NOTE — Addendum Note (Signed)
Encounter addended by: Jolaine Artist, MD on: 02/17/2020 10:34 AM  Actions taken: Clinical Note Signed

## 2020-02-17 NOTE — Addendum Note (Signed)
Encounter addended by: Shonna Chock, CMA on: 10/25/4112 10:40 AM  Actions taken: Visit diagnoses modified, Order list changed, Diagnosis association updated, Clinical Note Signed, Charge Capture section accepted, Follow-up modified

## 2020-02-22 ENCOUNTER — Telehealth (HOSPITAL_COMMUNITY): Payer: Self-pay | Admitting: *Deleted

## 2020-02-22 DIAGNOSIS — I5032 Chronic diastolic (congestive) heart failure: Secondary | ICD-10-CM

## 2020-02-22 MED ORDER — POTASSIUM CHLORIDE 20 MEQ/15ML (10%) PO SOLN: 40.0000 meq | Freq: Every day | ORAL | 6 refills | Status: DC

## 2020-02-22 NOTE — Telephone Encounter (Signed)
-----   Message from Jolaine Artist, MD sent at 02/21/2020  3:08 PM EDT ----- Start kdur 40 daily. Take 2 the first day. Repeat BMET 1 week

## 2020-02-22 NOTE — Telephone Encounter (Signed)
Pt aware, she states she can not take the potassium pill b/c they are too big. Advised we could try 10 meq tabs, sprinkle or liquid form. Pt is agreeable to try the liquid form, rx sent in, repeat labs sch for 6/28

## 2020-02-26 ENCOUNTER — Other Ambulatory Visit (HOSPITAL_COMMUNITY): Payer: Self-pay | Admitting: *Deleted

## 2020-02-29 ENCOUNTER — Other Ambulatory Visit: Payer: Self-pay

## 2020-02-29 ENCOUNTER — Ambulatory Visit (HOSPITAL_COMMUNITY)
Admission: RE | Admit: 2020-02-29 | Discharge: 2020-02-29 | Disposition: A | Discharge status: Home / Self Care | Payer: Medicaid Other | Source: Ambulatory Visit | Attending: Internal Medicine | Admitting: Internal Medicine

## 2020-02-29 ENCOUNTER — Ambulatory Visit (INDEPENDENT_AMBULATORY_CARE_PROVIDER_SITE_OTHER): Payer: Medicaid Other | Admitting: Obstetrics & Gynecology

## 2020-02-29 DIAGNOSIS — I5032 Chronic diastolic (congestive) heart failure: Secondary | ICD-10-CM

## 2020-02-29 DIAGNOSIS — O10012 Pre-existing essential hypertension complicating pregnancy, second trimester: Secondary | ICD-10-CM

## 2020-02-29 DIAGNOSIS — Z3A21 21 weeks gestation of pregnancy: Secondary | ICD-10-CM

## 2020-02-29 DIAGNOSIS — I214 Non-ST elevation (NSTEMI) myocardial infarction: Secondary | ICD-10-CM

## 2020-02-29 DIAGNOSIS — O99412 Diseases of the circulatory system complicating pregnancy, second trimester: Secondary | ICD-10-CM

## 2020-02-29 DIAGNOSIS — O24112 Pre-existing diabetes mellitus, type 2, in pregnancy, second trimester: Secondary | ICD-10-CM

## 2020-02-29 DIAGNOSIS — O0990 Supervision of high risk pregnancy, unspecified, unspecified trimester: Secondary | ICD-10-CM

## 2020-02-29 DIAGNOSIS — O99212 Obesity complicating pregnancy, second trimester: Secondary | ICD-10-CM

## 2020-02-29 DIAGNOSIS — Z98891 History of uterine scar from previous surgery: Secondary | ICD-10-CM

## 2020-02-29 DIAGNOSIS — O0992 Supervision of high risk pregnancy, unspecified, second trimester: Secondary | ICD-10-CM

## 2020-02-29 DIAGNOSIS — I10 Essential (primary) hypertension: Secondary | ICD-10-CM

## 2020-02-29 DIAGNOSIS — O24119 Pre-existing diabetes mellitus, type 2, in pregnancy, unspecified trimester: Secondary | ICD-10-CM

## 2020-02-29 DIAGNOSIS — O34219 Maternal care for unspecified type scar from previous cesarean delivery: Secondary | ICD-10-CM

## 2020-02-29 LAB — BASIC METABOLIC PANEL
Anion gap: 12 (ref 5–15)
BUN: 10 mg/dL (ref 6–20)
CO2: 20 mmol/L — ABNORMAL LOW (ref 22–32)
Calcium: 8.8 mg/dL — ABNORMAL LOW (ref 8.9–10.3)
Chloride: 104 mmol/L (ref 98–111)
Creatinine, Ser: 0.88 mg/dL (ref 0.44–1.00)
GFR calc Af Amer: >60 mL/min (ref >60)
GFR calc non Af Amer: >60 mL/min (ref >60)
Glucose, Bld: 188 mg/dL — ABNORMAL HIGH (ref 70–99)
Potassium: 2.9 mmol/L — ABNORMAL LOW (ref 3.5–5.1)
Sodium: 136 mmol/L (ref 135–145)

## 2020-02-29 NOTE — Patient Instructions (Signed)

## 2020-02-29 NOTE — Progress Notes (Signed)
PRENATAL VISIT NOTE  Subjective:  Lauren Delgado is a 34 y.o. G2P1001 at [redacted]w[redacted]d being seen today for ongoing prenatal care.  She is currently monitored for the following issues for this high-risk pregnancy and has Polycystic ovaries; Morbid obesity ([place]); History of cesarean section; Type 2 diabetes mellitus without complication ([place]); NSTEMI (non-ST elevated myocardial infarction) [facility]); ACS (acute coronary syndrome) ([place]); Hyperlipidemia associated with type 2 diabetes mellitus ([place]); Abdominal obesity and metabolic syndrome; Cardiomyopathy ([place]); Status post coronary artery stent placement; Coronary artery disease; Hypertension; Healthcare maintenance; Sleep apnea; History of herpes genitalis; Supervision of high risk pregnancy, antepartum; Proteinuria affecting pregnancy, antepartum; Hyperglycemia due to type 2 diabetes mellitus ([place]); Acute on chronic diastolic CHF (congestive heart failure) ([place]); Hypertensive crisis; Cardiac disease in mother affecting pregnancy in first trimester; Cough; 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; Type 2 diabetes mellitus in pregnancy, unspecified trimester; Hypertension in pregnancy, antepartum; Nausea and vomiting in pregnancy; Leg swelling; and Anemia on their problem list.  Patient reports edema.  Contractions: Not present. Vag. Bleeding: None.  Movement: Present. Denies leaking of fluid.   The following portions of the patient's history were reviewed and updated as appropriate: allergies, current medications, past family history, past medical history, past social history, past surgical history and problem list.   Objective:   Vitals:   02/29/20 1454  BP: 125/75  Pulse: 98  Weight: (!) 303 lb (137.4 kg)    Fetal Status: Fetal Heart Rate (bpm): 157   Movement: Present     General:  Alert, oriented and cooperative. Patient is in no acute distress.  Skin: Skin is warm and dry. No rash noted.   Cardiovascular: Normal heart rate noted  Respiratory: Normal  respiratory effort, no problems with respiration noted  Abdomen: Soft, gravid, appropriate for gestational age.  Pain/Pressure: Absent     Pelvic: Cervical exam deferred        Extremities: Normal range of motion.  Edema: Moderate pitting, indentation subsides rapidly  Mental Status: Normal mood and affect. Normal behavior. Normal judgment and thought content.   Assessment and Plan:  Pregnancy: G2P1001 at [redacted]w[redacted]d 1. Morbid obesity ([place]) Serial [place]  2. History of cesarean section Repeat at 37 weeks   3. NSTEMI (non-ST elevated myocardial infarction) ([place]) Followed by cardiology 1. Left ventricular ejection fraction, by estimation, is 55%. The left  ventricle has normal function. The left ventricle demonstrates regional  wall motion abnormalities (see scoring diagram/findings for description).  Left ventricular diastolic  parameters were normal. There is hypokinesis of the left ventricular,  basal-mid inferolateral wall.  2. Right ventricular systolic function is normal. The right ventricular  size is normal.  3. Left atrial size was mildly dilated.  4. The mitral valve is normal in structure. Trivial mitral valve  regurgitation.  5. The aortic valve is normal in structure. Aortic valve regurgitation is  not visualized.  4. Supervision of high risk pregnancy, antepartum   5. Type 2 diabetes mellitus in pregnancy, unspecified trimester Non-compliance with BG testing, hasn't tested in over one week, will consult with diabetes education to help reinforce testing 4/day so we can manage her insulin therapy - Ambulatory referral to Nutrition and Diabetic Education - US Fetal Echocardiography; Future  6. Essential hypertension BP control is good  Preterm labor symptoms and general obstetric precautions including but not limited to vaginal bleeding, contractions, leaking of fluid and fetal movement were reviewed in detail with the patient. Please refer to After Visit Summary for other  counseling recommendations.   Return  in about 3 weeks (around 03/21/2020).  Future Appointments  Date Time Provider [place]  03/14/2020  2:30 PM [facility] NURSE [facility]  03/14/2020  2:30 PM [facility] US3 [facility] [place]  03/15/2020  8:45 AM Almyra Deforest, PA [facility]  03/21/2020  3:20 PM Bensimhon, Shaune Pascal, MD [facility] None    Emeterio Reeve, MD

## 2020-03-01 ENCOUNTER — Encounter (HOSPITAL_COMMUNITY): Payer: Self-pay | Admitting: *Deleted

## 2020-03-02 ENCOUNTER — Encounter: Payer: Medicaid Other | Admitting: Family Medicine

## 2020-03-08 ENCOUNTER — Encounter: Payer: Self-pay | Admitting: Pediatric Cardiology

## 2020-03-09 ENCOUNTER — Telehealth: Payer: Self-pay | Admitting: Radiology

## 2020-03-09 ENCOUNTER — Encounter: Payer: Self-pay | Admitting: Radiology

## 2020-03-09 NOTE — Telephone Encounter (Signed)
Left message for patient explaining that we received a letter from [facility] stating that she was contacted by their office and she stated that she had already been seen. I explained that this particular test was for the baby and not the same as the echo done on her in June. Requested that patient call the office to confirm understanding.

## 2020-03-09 NOTE — Telephone Encounter (Signed)
Patient returned calling stating that fetal echo was done at [facility] on 03/08/20

## 2020-03-14 ENCOUNTER — Ambulatory Visit: Payer: Medicaid Other | Admitting: *Deleted

## 2020-03-14 ENCOUNTER — Ambulatory Visit: Payer: Medicaid Other | Attending: Obstetrics and Gynecology

## 2020-03-14 ENCOUNTER — Other Ambulatory Visit: Payer: Self-pay

## 2020-03-14 DIAGNOSIS — Z3A23 23 weeks gestation of pregnancy: Secondary | ICD-10-CM

## 2020-03-14 DIAGNOSIS — O09299 Supervision of pregnancy with other poor reproductive or obstetric history, unspecified trimester: Secondary | ICD-10-CM | POA: Diagnosis not present

## 2020-03-14 DIAGNOSIS — O99212 Obesity complicating pregnancy, second trimester: Secondary | ICD-10-CM

## 2020-03-14 DIAGNOSIS — E669 Obesity, unspecified: Secondary | ICD-10-CM

## 2020-03-14 DIAGNOSIS — O0990 Supervision of high risk pregnancy, unspecified, unspecified trimester: Secondary | ICD-10-CM | POA: Insufficient documentation

## 2020-03-14 DIAGNOSIS — O24119 Pre-existing diabetes mellitus, type 2, in pregnancy, unspecified trimester: Secondary | ICD-10-CM | POA: Insufficient documentation

## 2020-03-14 DIAGNOSIS — O24112 Pre-existing diabetes mellitus, type 2, in pregnancy, second trimester: Secondary | ICD-10-CM | POA: Diagnosis not present

## 2020-03-14 DIAGNOSIS — O1210 Gestational proteinuria, unspecified trimester: Secondary | ICD-10-CM

## 2020-03-14 DIAGNOSIS — O34219 Maternal care for unspecified type scar from previous cesarean delivery: Secondary | ICD-10-CM

## 2020-03-14 DIAGNOSIS — Z362 Encounter for other antenatal screening follow-up: Secondary | ICD-10-CM | POA: Insufficient documentation

## 2020-03-14 DIAGNOSIS — O10012 Pre-existing essential hypertension complicating pregnancy, second trimester: Secondary | ICD-10-CM

## 2020-03-15 ENCOUNTER — Ambulatory Visit: Payer: Medicaid Other | Admitting: Physician Assistant

## 2020-03-15 ENCOUNTER — Other Ambulatory Visit: Payer: Self-pay | Admitting: *Deleted

## 2020-03-15 DIAGNOSIS — O24119 Pre-existing diabetes mellitus, type 2, in pregnancy, unspecified trimester: Secondary | ICD-10-CM

## 2020-03-15 DIAGNOSIS — Z794 Long term (current) use of insulin: Secondary | ICD-10-CM

## 2020-03-16 ENCOUNTER — Other Ambulatory Visit: Payer: Self-pay

## 2020-03-16 ENCOUNTER — Ambulatory Visit (INDEPENDENT_AMBULATORY_CARE_PROVIDER_SITE_OTHER): Payer: Medicaid Other | Admitting: Family Medicine

## 2020-03-16 VITALS — BP 137/83 | HR 98 | Wt 298.6 lb

## 2020-03-16 DIAGNOSIS — O10012 Pre-existing essential hypertension complicating pregnancy, second trimester: Secondary | ICD-10-CM

## 2020-03-16 DIAGNOSIS — O99412 Diseases of the circulatory system complicating pregnancy, second trimester: Secondary | ICD-10-CM

## 2020-03-16 DIAGNOSIS — Z98891 History of uterine scar from previous surgery: Secondary | ICD-10-CM

## 2020-03-16 DIAGNOSIS — I519 Heart disease, unspecified: Secondary | ICD-10-CM

## 2020-03-16 DIAGNOSIS — Z3A23 23 weeks gestation of pregnancy: Secondary | ICD-10-CM

## 2020-03-16 DIAGNOSIS — R05 Cough: Secondary | ICD-10-CM

## 2020-03-16 DIAGNOSIS — R059 Cough, unspecified: Secondary | ICD-10-CM

## 2020-03-16 DIAGNOSIS — O2342 Unspecified infection of urinary tract in pregnancy, second trimester: Secondary | ICD-10-CM | POA: Diagnosis not present

## 2020-03-16 DIAGNOSIS — O24119 Pre-existing diabetes mellitus, type 2, in pregnancy, unspecified trimester: Secondary | ICD-10-CM

## 2020-03-16 DIAGNOSIS — O34219 Maternal care for unspecified type scar from previous cesarean delivery: Secondary | ICD-10-CM

## 2020-03-16 DIAGNOSIS — O24112 Pre-existing diabetes mellitus, type 2, in pregnancy, second trimester: Secondary | ICD-10-CM

## 2020-03-16 DIAGNOSIS — O0990 Supervision of high risk pregnancy, unspecified, unspecified trimester: Secondary | ICD-10-CM

## 2020-03-16 DIAGNOSIS — O10019 Pre-existing essential hypertension complicating pregnancy, unspecified trimester: Secondary | ICD-10-CM

## 2020-03-16 LAB — POCT URINALYSIS DIPSTICK
Bilirubin, UA: NEGATIVE
Glucose, UA: POSITIVE — AB
Ketones, UA: NEGATIVE
Leukocytes, UA: NEGATIVE
Nitrite, UA: NEGATIVE
Protein, UA: POSITIVE — AB
Spec Grav, UA: 1.015 (ref 1.010–1.025)
Urobilinogen, UA: NEGATIVE E.U./dL — AB
pH, UA: 6 (ref 5.0–8.0)

## 2020-03-16 MED ORDER — FUROSEMIDE 20 MG PO TABS: 40.0000 mg | ORAL_TABLET | Freq: Two times a day (BID) | ORAL | 2 refills | Status: DC

## 2020-03-16 NOTE — Progress Notes (Signed)
PRENATAL VISIT NOTE  Subjective:  Lauren Delgado is a 34 y.o. G2P1001 at [redacted]w[redacted]d being seen today for ongoing prenatal care.  She is currently monitored for the following issues for this high-risk pregnancy and has Polycystic ovaries; Morbid obesity ([place]); History of cesarean section; Type 2 diabetes mellitus without complication ([place]); NSTEMI (non-ST elevated myocardial infarction) [facility]); ACS (acute coronary syndrome) ([place]); Hyperlipidemia associated with type 2 diabetes mellitus ([place]); Abdominal obesity and metabolic syndrome; Cardiomyopathy ([place]); Status post coronary artery stent placement; Coronary artery disease; Hypertension; Healthcare maintenance; Sleep apnea; History of herpes genitalis; Supervision of high risk pregnancy, antepartum; Proteinuria affecting pregnancy, antepartum; Hyperglycemia due to type 2 diabetes mellitus ([place]); Acute on chronic diastolic CHF (congestive heart failure) ([place]); Hypertensive crisis; Cardiac disease in mother affecting pregnancy in first trimester; Cough; 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; Type 2 diabetes mellitus in pregnancy, unspecified trimester; Hypertension in pregnancy, antepartum; Nausea and vomiting in pregnancy; Leg swelling; and Anemia on their problem list.  Patient reports LE swelling.  Contractions: Not present. Vag. Bleeding: None.  Movement: Present. Denies leaking of fluid.   The following portions of the patient's history were reviewed and updated as appropriate: allergies, current medications, past family history, past medical history, past social history, past surgical history and problem list.   Objective:   Vitals:   03/16/20 0949  BP: 137/83  Pulse: 98  Weight: 298 lb 9.6 oz (135.4 kg)    Fetal Status: Fetal Heart Rate (bpm): 157   Movement: Present     General:  Alert, oriented and cooperative. Patient is in no acute distress.  Skin: Skin is warm and dry. No rash noted.   Cardiovascular: Normal heart rate noted  Respiratory: Normal  respiratory effort, no problems with respiration noted  Abdomen: Soft, gravid, appropriate for gestational age.  Pain/Pressure: Present     Pelvic: Cervical exam deferred        Extremities: Normal range of motion.  Edema: Moderate pitting, indentation subsides rapidly  Mental Status: Normal mood and affect. Normal behavior. Normal judgment and thought content.   Assessment and Plan:  Pregnancy: G2P1001 at [redacted]w[redacted]d 1. Pre-existing essential hypertension during pregnancy, antepartum Bp is ok today On labetalol, Hydralazine, Imdur and lasix Continue ASA  2. Cardiac disease in mother affecting pregnancy in first trimester No chest pain currently Continue Imdur Has NTG if needed Has cardiology f/u - furosemide (LASIX) 20 MG tablet; Take 2 tablets (40 mg total) by mouth 2 (two) times daily.  Dispense: 60 tablet; Refill: 2  3. Type 2 diabetes mellitus in pregnancy, unspecified trimester Not checking her CBGs Discussed risks of poorly controlled diabetes including still birth  4. History of cesarean section for RCS with BTL--sign BTL papers next visit  5. Supervision of high risk pregnancy, antepartum  6. LE edema Likely related to nephrotic range proteinuria Lasix, compression hose Elevate LE  7. UTI - E.coli TOC today  Preterm labor symptoms and general obstetric precautions including but not limited to vaginal bleeding, contractions, leaking of fluid and fetal movement were reviewed in detail with the patient. Please refer to After Visit Summary for other counseling recommendations.   Return in 2 weeks (on 03/30/2020).  Future Appointments  Date Time Provider [place]  03/21/2020  3:20 PM Bensimhon, Shaune Pascal, MD [facility] None  03/30/2020  9:45 AM Anyanwu, Sallyanne Havers, MD [facility]  04/04/2020 10:30 AM [facility] NURSE [facility]  04/04/2020 10:45 AM [facility] US4 [facility]  04/21/2020  9:45 AM Almyra Deforest, PA [facility]  Donnamae Jude, MD

## 2020-03-16 NOTE — Patient Instructions (Signed)

## 2020-03-20 LAB — URINE CULTURE, OB REFLEX

## 2020-03-20 LAB — CULTURE, OB URINE

## 2020-03-21 ENCOUNTER — Other Ambulatory Visit: Payer: Self-pay | Admitting: Obstetrics & Gynecology

## 2020-03-21 ENCOUNTER — Encounter (HOSPITAL_COMMUNITY): Payer: Medicaid Other | Admitting: Internal Medicine

## 2020-03-21 DIAGNOSIS — O2342 Unspecified infection of urinary tract in pregnancy, second trimester: Secondary | ICD-10-CM

## 2020-03-21 MED ORDER — CEFADROXIL 500 MG PO CAPS: 500.0000 mg | ORAL_CAPSULE | Freq: Two times a day (BID) | ORAL | 0 refills | Status: DC

## 2020-03-22 ENCOUNTER — Other Ambulatory Visit: Payer: Self-pay | Admitting: *Deleted

## 2020-03-22 ENCOUNTER — Encounter: Payer: Medicaid Other | Admitting: Obstetrics & Gynecology

## 2020-03-22 MED ORDER — FUROSEMIDE 40 MG PO TABS
40.0000 mg | ORAL_TABLET | Freq: Two times a day (BID) | ORAL | 3 refills | Status: DC
Start: 2020-03-22 — End: 2020-06-01

## 2020-03-23 ENCOUNTER — Encounter: Payer: Self-pay | Admitting: *Deleted

## 2020-03-30 ENCOUNTER — Encounter: Payer: Medicaid Other | Admitting: Obstetrics & Gynecology

## 2020-04-04 ENCOUNTER — Ambulatory Visit: Payer: Medicaid Other | Attending: Obstetrics and Gynecology

## 2020-04-04 ENCOUNTER — Other Ambulatory Visit: Payer: Self-pay | Admitting: *Deleted

## 2020-04-04 ENCOUNTER — Ambulatory Visit: Payer: Medicaid Other | Admitting: *Deleted

## 2020-04-04 ENCOUNTER — Other Ambulatory Visit: Payer: Self-pay | Admitting: Obstetrics and Gynecology

## 2020-04-04 ENCOUNTER — Telehealth: Payer: Self-pay | Admitting: Radiology

## 2020-04-04 ENCOUNTER — Other Ambulatory Visit: Payer: Self-pay

## 2020-04-04 ENCOUNTER — Encounter: Payer: Self-pay | Admitting: *Deleted

## 2020-04-04 DIAGNOSIS — O0990 Supervision of high risk pregnancy, unspecified, unspecified trimester: Secondary | ICD-10-CM | POA: Diagnosis present

## 2020-04-04 DIAGNOSIS — E669 Obesity, unspecified: Secondary | ICD-10-CM | POA: Diagnosis not present

## 2020-04-04 DIAGNOSIS — O24112 Pre-existing diabetes mellitus, type 2, in pregnancy, second trimester: Secondary | ICD-10-CM

## 2020-04-04 DIAGNOSIS — O1210 Gestational proteinuria, unspecified trimester: Secondary | ICD-10-CM | POA: Diagnosis present

## 2020-04-04 DIAGNOSIS — Z362 Encounter for other antenatal screening follow-up: Secondary | ICD-10-CM | POA: Diagnosis not present

## 2020-04-04 DIAGNOSIS — Z794 Long term (current) use of insulin: Secondary | ICD-10-CM | POA: Diagnosis not present

## 2020-04-04 DIAGNOSIS — O10019 Pre-existing essential hypertension complicating pregnancy, unspecified trimester: Secondary | ICD-10-CM

## 2020-04-04 DIAGNOSIS — I10 Essential (primary) hypertension: Secondary | ICD-10-CM | POA: Diagnosis not present

## 2020-04-04 DIAGNOSIS — O24119 Pre-existing diabetes mellitus, type 2, in pregnancy, unspecified trimester: Secondary | ICD-10-CM

## 2020-04-04 DIAGNOSIS — O09292 Supervision of pregnancy with other poor reproductive or obstetric history, second trimester: Secondary | ICD-10-CM

## 2020-04-04 DIAGNOSIS — Z3A26 26 weeks gestation of pregnancy: Secondary | ICD-10-CM

## 2020-04-04 DIAGNOSIS — Z8632 Personal history of gestational diabetes: Secondary | ICD-10-CM | POA: Diagnosis not present

## 2020-04-04 DIAGNOSIS — O365920 Maternal care for other known or suspected poor fetal growth, second trimester, not applicable or unspecified: Secondary | ICD-10-CM

## 2020-04-04 DIAGNOSIS — O99212 Obesity complicating pregnancy, second trimester: Secondary | ICD-10-CM | POA: Diagnosis not present

## 2020-04-04 DIAGNOSIS — O10912 Unspecified pre-existing hypertension complicating pregnancy, second trimester: Secondary | ICD-10-CM | POA: Diagnosis not present

## 2020-04-04 DIAGNOSIS — O10012 Pre-existing essential hypertension complicating pregnancy, second trimester: Secondary | ICD-10-CM | POA: Diagnosis not present

## 2020-04-04 DIAGNOSIS — O34219 Maternal care for unspecified type scar from previous cesarean delivery: Secondary | ICD-10-CM | POA: Diagnosis not present

## 2020-04-04 NOTE — Telephone Encounter (Signed)
Called patient to reschedule missed appointment on 03/30/20, left message to call [facility]. Patient has OB visit at [facility] on 04/08/20, need clarification if patient intends to continue care at [facility] or transferring care.

## 2020-04-05 ENCOUNTER — Telehealth: Payer: Self-pay

## 2020-04-05 NOTE — Telephone Encounter (Signed)
Called pt to follow up on [employer] usage per Ilda Basset, MD. VM left stating pt should call the office back or respond to [place] message. MyChart message sent.

## 2020-04-07 ENCOUNTER — Telehealth: Payer: Self-pay | Admitting: Radiology

## 2020-04-07 ENCOUNTER — Other Ambulatory Visit (HOSPITAL_COMMUNITY): Payer: Self-pay | Admitting: Surgery

## 2020-04-07 ENCOUNTER — Encounter: Payer: Self-pay | Admitting: Advanced Practice Midwife

## 2020-04-07 DIAGNOSIS — O365990 Maternal care for other known or suspected poor fetal growth, unspecified trimester, not applicable or unspecified: Secondary | ICD-10-CM | POA: Insufficient documentation

## 2020-04-07 MED ORDER — HYDRALAZINE HCL 25 MG PO TABS: 50.0000 mg | ORAL_TABLET | Freq: Three times a day (TID) | ORAL | 0 refills | Status: DC

## 2020-04-07 NOTE — Telephone Encounter (Signed)
Called and left message for patient to call [facility] to schedule next OB appointment. Patient missed appointment on 03/30/20 and has not returned attempted calls of mychart messages.

## 2020-04-08 ENCOUNTER — Encounter: Payer: Medicaid Other | Admitting: Family Medicine

## 2020-04-12 DIAGNOSIS — H5213 Myopia, bilateral: Secondary | ICD-10-CM | POA: Diagnosis not present

## 2020-04-19 ENCOUNTER — Ambulatory Visit: Payer: Medicaid Other

## 2020-04-20 ENCOUNTER — Telehealth: Payer: Self-pay | Admitting: Radiology

## 2020-04-20 ENCOUNTER — Encounter: Payer: Self-pay | Admitting: General Practice

## 2020-04-20 NOTE — Telephone Encounter (Signed)
Left patient message to call office to schedule appointment for OB.

## 2020-04-21 ENCOUNTER — Ambulatory Visit (INDEPENDENT_AMBULATORY_CARE_PROVIDER_SITE_OTHER): Payer: Medicaid Other | Admitting: Physician Assistant

## 2020-04-21 ENCOUNTER — Encounter: Payer: Self-pay | Admitting: Physician Assistant

## 2020-04-21 ENCOUNTER — Other Ambulatory Visit: Payer: Self-pay

## 2020-04-21 VITALS — BP 110/70 | HR 92 | Ht 62.0 in | Wt 288.0 lb

## 2020-04-21 DIAGNOSIS — E876 Hypokalemia: Secondary | ICD-10-CM | POA: Diagnosis not present

## 2020-04-21 DIAGNOSIS — Z794 Long term (current) use of insulin: Secondary | ICD-10-CM | POA: Diagnosis not present

## 2020-04-21 DIAGNOSIS — I5032 Chronic diastolic (congestive) heart failure: Secondary | ICD-10-CM

## 2020-04-21 DIAGNOSIS — E119 Type 2 diabetes mellitus without complications: Secondary | ICD-10-CM

## 2020-04-21 DIAGNOSIS — E785 Hyperlipidemia, unspecified: Secondary | ICD-10-CM

## 2020-04-21 DIAGNOSIS — I10 Essential (primary) hypertension: Secondary | ICD-10-CM | POA: Diagnosis not present

## 2020-04-21 DIAGNOSIS — I2510 Atherosclerotic heart disease of native coronary artery without angina pectoris: Secondary | ICD-10-CM | POA: Diagnosis not present

## 2020-04-21 LAB — BASIC METABOLIC PANEL
BUN/Creatinine Ratio: 16 (ref 9–23)
BUN: 14 mg/dL (ref 6–20)
CO2: 19 mmol/L — ABNORMAL LOW (ref 20–29)
Calcium: 8.5 mg/dL — ABNORMAL LOW (ref 8.7–10.2)
Chloride: 100 mmol/L (ref 96–106)
Creatinine, Ser: 0.87 mg/dL (ref 0.57–1.00)
GFR calc Af Amer: 101 mL/min/{1.73_m2} (ref >59)
GFR calc non Af Amer: 88 mL/min/{1.73_m2} (ref >59)
Glucose: 244 mg/dL — ABNORMAL HIGH (ref 65–99)
Potassium: 3.5 mmol/L (ref 3.5–5.2)
Sodium: 134 mmol/L (ref 134–144)

## 2020-04-21 MED ORDER — POTASSIUM CHLORIDE 20 MEQ/15ML (10%) PO SOLN: 40.0000 meq | Freq: Every day | ORAL | 6 refills | Status: DC
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
## 2020-04-21 NOTE — Patient Instructions (Addendum)
Medication Instructions:  CONTINUE WITH CURRENT MEDICATIONS. NO CHANGES.  *If you need a refill on your cardiac medications before your next appointment, please call your pharmacy*   Lab Work: BMET If you have labs (blood work) drawn today and your tests are completely normal, you will receive your results only by: Marland Kitchen MyChart Message (if you have MyChart) OR . A paper copy in the mail If you have any lab test that is abnormal or we need to change your treatment, we will call you to review the results.    Follow-Up: At [facility], you and your health needs are our priority.  As part of our continuing mission to provide you with exceptional heart care, we have created designated Provider Care Teams.  These Care Teams include your primary Cardiologist (physician) and Advanced Practice Providers (APPs -  Physician Assistants and Nurse Practitioners) who all work together to provide you with the care you need, when you need it.  We recommend signing up for the patient portal called "MyChart".  Sign up information is provided on this After Visit Summary.  MyChart is used to connect with patients for Virtual Visits (Telemedicine).  Patients are able to view lab/test results, encounter notes, upcoming appointments, etc.  Non-urgent messages can be sent to your provider as well.   To learn more about what you can do with MyChart, go to [email or web address].    Your next appointment:   5 month(s)  The format for your next appointment:   In Person  Provider:   K. Mali [name], MD   Other Instructions Keep your upcoming appt with Dr.Bensimhon

## 2020-04-22 ENCOUNTER — Ambulatory Visit: Payer: Medicaid Other | Admitting: *Deleted

## 2020-04-22 ENCOUNTER — Ambulatory Visit: Payer: Medicaid Other | Attending: Obstetrics

## 2020-04-22 ENCOUNTER — Ambulatory Visit (HOSPITAL_BASED_OUTPATIENT_CLINIC_OR_DEPARTMENT_OTHER): Payer: Medicaid Other | Admitting: *Deleted

## 2020-04-22 DIAGNOSIS — O10911 Unspecified pre-existing hypertension complicating pregnancy, first trimester: Secondary | ICD-10-CM

## 2020-04-22 DIAGNOSIS — O10019 Pre-existing essential hypertension complicating pregnancy, unspecified trimester: Secondary | ICD-10-CM | POA: Insufficient documentation

## 2020-04-22 DIAGNOSIS — O24119 Pre-existing diabetes mellitus, type 2, in pregnancy, unspecified trimester: Secondary | ICD-10-CM

## 2020-04-22 DIAGNOSIS — O0990 Supervision of high risk pregnancy, unspecified, unspecified trimester: Secondary | ICD-10-CM | POA: Insufficient documentation

## 2020-04-22 DIAGNOSIS — E669 Obesity, unspecified: Secondary | ICD-10-CM | POA: Diagnosis not present

## 2020-04-22 DIAGNOSIS — O1210 Gestational proteinuria, unspecified trimester: Secondary | ICD-10-CM

## 2020-04-22 DIAGNOSIS — I10 Essential (primary) hypertension: Secondary | ICD-10-CM | POA: Diagnosis not present

## 2020-04-22 DIAGNOSIS — O365931 Maternal care for other known or suspected poor fetal growth, third trimester, fetus 1: Secondary | ICD-10-CM

## 2020-04-22 DIAGNOSIS — O10013 Pre-existing essential hypertension complicating pregnancy, third trimester: Secondary | ICD-10-CM

## 2020-04-22 DIAGNOSIS — O09293 Supervision of pregnancy with other poor reproductive or obstetric history, third trimester: Secondary | ICD-10-CM

## 2020-04-22 DIAGNOSIS — Z3A28 28 weeks gestation of pregnancy: Secondary | ICD-10-CM | POA: Insufficient documentation

## 2020-04-22 DIAGNOSIS — O365930 Maternal care for other known or suspected poor fetal growth, third trimester, not applicable or unspecified: Secondary | ICD-10-CM

## 2020-04-22 DIAGNOSIS — O24113 Pre-existing diabetes mellitus, type 2, in pregnancy, third trimester: Secondary | ICD-10-CM | POA: Insufficient documentation

## 2020-04-22 DIAGNOSIS — O99213 Obesity complicating pregnancy, third trimester: Secondary | ICD-10-CM

## 2020-04-22 DIAGNOSIS — O34219 Maternal care for unspecified type scar from previous cesarean delivery: Secondary | ICD-10-CM | POA: Diagnosis not present

## 2020-04-22 NOTE — Procedures (Signed)
No note

## 2020-04-23 ENCOUNTER — Encounter: Payer: Self-pay | Admitting: Physician Assistant

## 2020-04-26 ENCOUNTER — Ambulatory Visit: Payer: Medicaid Other

## 2020-04-27 ENCOUNTER — Telehealth: Payer: Self-pay | Admitting: Radiology

## 2020-04-27 ENCOUNTER — Encounter (INDEPENDENT_AMBULATORY_CARE_PROVIDER_SITE_OTHER): Payer: Medicaid Other | Admitting: Ophthalmology

## 2020-04-27 NOTE — Telephone Encounter (Signed)
Left message for patient to call [facility] to schedule a OB appointment

## 2020-04-29 ENCOUNTER — Ambulatory Visit: Payer: Medicaid Other | Admitting: *Deleted

## 2020-04-29 ENCOUNTER — Other Ambulatory Visit: Payer: Self-pay

## 2020-04-29 ENCOUNTER — Other Ambulatory Visit: Payer: Self-pay | Admitting: *Deleted

## 2020-04-29 ENCOUNTER — Ambulatory Visit: Payer: Medicaid Other | Attending: Obstetrics

## 2020-04-29 DIAGNOSIS — O0990 Supervision of high risk pregnancy, unspecified, unspecified trimester: Secondary | ICD-10-CM | POA: Diagnosis present

## 2020-04-29 DIAGNOSIS — O365930 Maternal care for other known or suspected poor fetal growth, third trimester, not applicable or unspecified: Secondary | ICD-10-CM | POA: Diagnosis not present

## 2020-04-29 DIAGNOSIS — O10019 Pre-existing essential hypertension complicating pregnancy, unspecified trimester: Secondary | ICD-10-CM | POA: Diagnosis present

## 2020-04-29 DIAGNOSIS — Z3A29 29 weeks gestation of pregnancy: Secondary | ICD-10-CM

## 2020-04-29 DIAGNOSIS — O365931 Maternal care for other known or suspected poor fetal growth, third trimester, fetus 1: Secondary | ICD-10-CM

## 2020-04-29 DIAGNOSIS — O99213 Obesity complicating pregnancy, third trimester: Secondary | ICD-10-CM

## 2020-04-29 DIAGNOSIS — O1210 Gestational proteinuria, unspecified trimester: Secondary | ICD-10-CM | POA: Insufficient documentation

## 2020-04-29 DIAGNOSIS — O24113 Pre-existing diabetes mellitus, type 2, in pregnancy, third trimester: Secondary | ICD-10-CM | POA: Insufficient documentation

## 2020-04-29 DIAGNOSIS — O34219 Maternal care for unspecified type scar from previous cesarean delivery: Secondary | ICD-10-CM | POA: Diagnosis not present

## 2020-04-29 DIAGNOSIS — O10013 Pre-existing essential hypertension complicating pregnancy, third trimester: Secondary | ICD-10-CM | POA: Diagnosis not present

## 2020-04-29 DIAGNOSIS — O09293 Supervision of pregnancy with other poor reproductive or obstetric history, third trimester: Secondary | ICD-10-CM

## 2020-04-29 DIAGNOSIS — O24119 Pre-existing diabetes mellitus, type 2, in pregnancy, unspecified trimester: Secondary | ICD-10-CM | POA: Insufficient documentation

## 2020-04-29 DIAGNOSIS — O365990 Maternal care for other known or suspected poor fetal growth, unspecified trimester, not applicable or unspecified: Secondary | ICD-10-CM

## 2020-04-29 NOTE — Procedures (Signed)
Lauren Delgado Oct 29, 1985 [redacted]w[redacted]d  Fetus A Non-Stress Test Interpretation for 04/29/20  Indication: Diabetes  Fetal Heart Rate A Mode: External Baseline Rate (A): 140 bpm Variability: Minimal, Moderate Accelerations: 10 x 10 (x1) Decelerations: None Multiple birth?: No  Uterine Activity Mode: Palpation, Toco Contraction Frequency (min): none noted Resting Tone Palpated: Relaxed Resting Time: Adequate  Interpretation (Fetal Testing) Nonstress Test Interpretation: Non-reactive Overall Impression: Reassuring for gestational age Comments: Tracing reviewed by Dr. Donalee Citrin

## 2020-05-06 ENCOUNTER — Ambulatory Visit: Payer: Medicaid Other | Admitting: *Deleted

## 2020-05-06 ENCOUNTER — Ambulatory Visit (HOSPITAL_BASED_OUTPATIENT_CLINIC_OR_DEPARTMENT_OTHER): Payer: Medicaid Other

## 2020-05-06 ENCOUNTER — Other Ambulatory Visit: Payer: Self-pay

## 2020-05-06 ENCOUNTER — Encounter: Payer: Self-pay | Admitting: *Deleted

## 2020-05-06 DIAGNOSIS — O09293 Supervision of pregnancy with other poor reproductive or obstetric history, third trimester: Secondary | ICD-10-CM

## 2020-05-06 DIAGNOSIS — O10019 Pre-existing essential hypertension complicating pregnancy, unspecified trimester: Secondary | ICD-10-CM

## 2020-05-06 DIAGNOSIS — O24119 Pre-existing diabetes mellitus, type 2, in pregnancy, unspecified trimester: Secondary | ICD-10-CM

## 2020-05-06 DIAGNOSIS — Z794 Long term (current) use of insulin: Secondary | ICD-10-CM

## 2020-05-06 DIAGNOSIS — O2343 Unspecified infection of urinary tract in pregnancy, third trimester: Secondary | ICD-10-CM | POA: Diagnosis not present

## 2020-05-06 DIAGNOSIS — O34219 Maternal care for unspecified type scar from previous cesarean delivery: Secondary | ICD-10-CM

## 2020-05-06 DIAGNOSIS — I5032 Chronic diastolic (congestive) heart failure: Secondary | ICD-10-CM | POA: Diagnosis not present

## 2020-05-06 DIAGNOSIS — Z6841 Body Mass Index (BMI) 40.0 and over, adult: Secondary | ICD-10-CM | POA: Diagnosis not present

## 2020-05-06 DIAGNOSIS — O365990 Maternal care for other known or suspected poor fetal growth, unspecified trimester, not applicable or unspecified: Secondary | ICD-10-CM

## 2020-05-06 DIAGNOSIS — Z3A31 31 weeks gestation of pregnancy: Secondary | ICD-10-CM | POA: Diagnosis not present

## 2020-05-06 DIAGNOSIS — O1210 Gestational proteinuria, unspecified trimester: Secondary | ICD-10-CM | POA: Insufficient documentation

## 2020-05-06 DIAGNOSIS — O24414 Gestational diabetes mellitus in pregnancy, insulin controlled: Secondary | ICD-10-CM

## 2020-05-06 DIAGNOSIS — O99213 Obesity complicating pregnancy, third trimester: Secondary | ICD-10-CM | POA: Diagnosis not present

## 2020-05-06 DIAGNOSIS — I509 Heart failure, unspecified: Secondary | ICD-10-CM | POA: Diagnosis not present

## 2020-05-06 DIAGNOSIS — O99283 Endocrine, nutritional and metabolic diseases complicating pregnancy, third trimester: Secondary | ICD-10-CM | POA: Diagnosis not present

## 2020-05-06 DIAGNOSIS — I110 Hypertensive heart disease with heart failure: Secondary | ICD-10-CM | POA: Diagnosis not present

## 2020-05-06 DIAGNOSIS — O0990 Supervision of high risk pregnancy, unspecified, unspecified trimester: Secondary | ICD-10-CM | POA: Insufficient documentation

## 2020-05-06 DIAGNOSIS — E669 Obesity, unspecified: Secondary | ICD-10-CM | POA: Diagnosis not present

## 2020-05-06 DIAGNOSIS — O24410 Gestational diabetes mellitus in pregnancy, diet controlled: Secondary | ICD-10-CM | POA: Diagnosis not present

## 2020-05-06 DIAGNOSIS — Z9114 Patient's other noncompliance with medication regimen: Secondary | ICD-10-CM | POA: Diagnosis not present

## 2020-05-06 DIAGNOSIS — O24113 Pre-existing diabetes mellitus, type 2, in pregnancy, third trimester: Secondary | ICD-10-CM

## 2020-05-06 DIAGNOSIS — E039 Hypothyroidism, unspecified: Secondary | ICD-10-CM | POA: Diagnosis not present

## 2020-05-06 DIAGNOSIS — E119 Type 2 diabetes mellitus without complications: Secondary | ICD-10-CM | POA: Diagnosis not present

## 2020-05-06 DIAGNOSIS — Z79899 Other long term (current) drug therapy: Secondary | ICD-10-CM | POA: Diagnosis not present

## 2020-05-06 DIAGNOSIS — Z3A30 30 weeks gestation of pregnancy: Secondary | ICD-10-CM

## 2020-05-06 DIAGNOSIS — O10013 Pre-existing essential hypertension complicating pregnancy, third trimester: Secondary | ICD-10-CM | POA: Diagnosis not present

## 2020-05-06 DIAGNOSIS — O99413 Diseases of the circulatory system complicating pregnancy, third trimester: Secondary | ICD-10-CM | POA: Diagnosis not present

## 2020-05-06 DIAGNOSIS — O365930 Maternal care for other known or suspected poor fetal growth, third trimester, not applicable or unspecified: Secondary | ICD-10-CM | POA: Diagnosis not present

## 2020-05-06 DIAGNOSIS — I2510 Atherosclerotic heart disease of native coronary artery without angina pectoris: Secondary | ICD-10-CM | POA: Diagnosis not present

## 2020-05-06 DIAGNOSIS — Z20822 Contact with and (suspected) exposure to covid-19: Secondary | ICD-10-CM | POA: Diagnosis not present

## 2020-05-06 NOTE — Procedures (Signed)
Lauren Delgado 19-Sep-1985 [redacted]w[redacted]d  Fetus A Non-Stress Test Interpretation for 05/06/20  Indication: IUGR, CHTN, diabetes  Fetal Heart Rate A Mode: External Baseline Rate (A): 145 bpm Variability: Moderate Accelerations: 10 x 10 (X 2) Decelerations: None Multiple birth?: No  Uterine Activity Mode: Palpation, Toco Contraction Frequency (min): None Resting Tone Palpated: Relaxed Resting Time: Adequate  Interpretation (Fetal Testing) Nonstress Test Interpretation: Reactive Overall Impression: Reassuring for gestational age Comments: Pt. received extended EFM. Difficult to trace at times. Dr. Donalee Citrin reviewed tracing and discussed admission to hospital within the next 24hrs for further evaluation of FHR, diabetes, CHTN

## 2020-05-07 ENCOUNTER — Inpatient Hospital Stay (HOSPITAL_COMMUNITY): Admission: AD | Admit: 2020-05-07 | Payer: Medicaid Other | Source: Home / Self Care

## 2020-05-07 ENCOUNTER — Encounter (HOSPITAL_COMMUNITY): Payer: Self-pay | Admitting: Obstetrics and Gynecology

## 2020-05-07 ENCOUNTER — Observation Stay (HOSPITAL_COMMUNITY)
Admission: AD | Admit: 2020-05-07 | Discharge: 2020-05-10 | Disposition: A | Discharge status: Home / Self Care | Payer: Medicaid Other | Attending: Obstetrics and Gynecology | Admitting: Obstetrics and Gynecology

## 2020-05-07 ENCOUNTER — Other Ambulatory Visit: Payer: Self-pay

## 2020-05-07 DIAGNOSIS — I509 Heart failure, unspecified: Secondary | ICD-10-CM | POA: Insufficient documentation

## 2020-05-07 DIAGNOSIS — Z9114 Patient's other noncompliance with medication regimen: Secondary | ICD-10-CM | POA: Insufficient documentation

## 2020-05-07 DIAGNOSIS — Z6841 Body Mass Index (BMI) 40.0 and over, adult: Secondary | ICD-10-CM | POA: Insufficient documentation

## 2020-05-07 DIAGNOSIS — Z3A30 30 weeks gestation of pregnancy: Secondary | ICD-10-CM | POA: Diagnosis not present

## 2020-05-07 DIAGNOSIS — E1165 Type 2 diabetes mellitus with hyperglycemia: Secondary | ICD-10-CM | POA: Diagnosis not present

## 2020-05-07 DIAGNOSIS — O99413 Diseases of the circulatory system complicating pregnancy, third trimester: Secondary | ICD-10-CM | POA: Insufficient documentation

## 2020-05-07 DIAGNOSIS — Z955 Presence of coronary angioplasty implant and graft: Secondary | ICD-10-CM | POA: Diagnosis not present

## 2020-05-07 DIAGNOSIS — O24313 Unspecified pre-existing diabetes mellitus in pregnancy, third trimester: Secondary | ICD-10-CM | POA: Diagnosis not present

## 2020-05-07 DIAGNOSIS — Z20822 Contact with and (suspected) exposure to covid-19: Secondary | ICD-10-CM | POA: Insufficient documentation

## 2020-05-07 DIAGNOSIS — O10013 Pre-existing essential hypertension complicating pregnancy, third trimester: Secondary | ICD-10-CM | POA: Diagnosis not present

## 2020-05-07 DIAGNOSIS — I2510 Atherosclerotic heart disease of native coronary artery without angina pectoris: Secondary | ICD-10-CM | POA: Insufficient documentation

## 2020-05-07 DIAGNOSIS — Z3A31 31 weeks gestation of pregnancy: Secondary | ICD-10-CM

## 2020-05-07 DIAGNOSIS — O99213 Obesity complicating pregnancy, third trimester: Secondary | ICD-10-CM | POA: Diagnosis not present

## 2020-05-07 DIAGNOSIS — O24410 Gestational diabetes mellitus in pregnancy, diet controlled: Principal | ICD-10-CM | POA: Insufficient documentation

## 2020-05-07 DIAGNOSIS — O365930 Maternal care for other known or suspected poor fetal growth, third trimester, not applicable or unspecified: Secondary | ICD-10-CM | POA: Diagnosis not present

## 2020-05-07 DIAGNOSIS — E119 Type 2 diabetes mellitus without complications: Secondary | ICD-10-CM | POA: Diagnosis not present

## 2020-05-07 DIAGNOSIS — R7309 Other abnormal glucose: Secondary | ICD-10-CM | POA: Diagnosis present

## 2020-05-07 DIAGNOSIS — B9620 Unspecified Escherichia coli [E. coli] as the cause of diseases classified elsewhere: Secondary | ICD-10-CM | POA: Diagnosis not present

## 2020-05-07 DIAGNOSIS — Z79899 Other long term (current) drug therapy: Secondary | ICD-10-CM | POA: Insufficient documentation

## 2020-05-07 DIAGNOSIS — O24113 Pre-existing diabetes mellitus, type 2, in pregnancy, third trimester: Secondary | ICD-10-CM | POA: Diagnosis not present

## 2020-05-07 DIAGNOSIS — O2343 Unspecified infection of urinary tract in pregnancy, third trimester: Secondary | ICD-10-CM | POA: Diagnosis not present

## 2020-05-07 DIAGNOSIS — I110 Hypertensive heart disease with heart failure: Secondary | ICD-10-CM | POA: Insufficient documentation

## 2020-05-07 DIAGNOSIS — Z794 Long term (current) use of insulin: Secondary | ICD-10-CM | POA: Insufficient documentation

## 2020-05-07 DIAGNOSIS — O99283 Endocrine, nutritional and metabolic diseases complicating pregnancy, third trimester: Secondary | ICD-10-CM | POA: Insufficient documentation

## 2020-05-07 DIAGNOSIS — E039 Hypothyroidism, unspecified: Secondary | ICD-10-CM | POA: Insufficient documentation

## 2020-05-07 LAB — GLUCOSE, CAPILLARY
Glucose-Capillary: 23 mg/dL — CL (ref 70–99)
Glucose-Capillary: 76 mg/dL (ref 70–99)
Glucose-Capillary: 190 mg/dL — ABNORMAL HIGH (ref 70–99)

## 2020-05-07 LAB — COMPREHENSIVE METABOLIC PANEL
ALT: 16 U/L (ref 0–44)
AST: 15 U/L (ref 15–41)
Albumin: 2.2 g/dL — ABNORMAL LOW (ref 3.5–5.0)
Alkaline Phosphatase: 84 U/L (ref 38–126)
Anion gap: 11 (ref 5–15)
BUN: 11 mg/dL (ref 6–20)
CO2: 22 mmol/L (ref 22–32)
Calcium: 9 mg/dL (ref 8.9–10.3)
Chloride: 105 mmol/L (ref 98–111)
Creatinine, Ser: 0.86 mg/dL (ref 0.44–1.00)
GFR calc Af Amer: >60 mL/min (ref >60)
GFR calc non Af Amer: >60 mL/min (ref >60)
Glucose, Bld: 78 mg/dL (ref 70–99)
Potassium: 3.2 mmol/L — ABNORMAL LOW (ref 3.5–5.1)
Sodium: 138 mmol/L (ref 135–145)
Total Bilirubin: 0.4 mg/dL (ref 0.3–1.2)
Total Protein: 6.5 g/dL (ref 6.5–8.1)

## 2020-05-07 LAB — CBC
HCT: 29.6 % — ABNORMAL LOW (ref 36.0–46.0)
Hemoglobin: 10 g/dL — ABNORMAL LOW (ref 12.0–15.0)
MCH: 29.6 pg (ref 26.0–34.0)
MCHC: 33.8 g/dL (ref 30.0–36.0)
MCV: 87.6 fL (ref 80.0–100.0)
Platelets: 336 10*3/uL (ref 150–400)
RBC: 3.38 MIL/uL — ABNORMAL LOW (ref 3.87–5.11)
RDW: 13.1 % (ref 11.5–15.5)
WBC: 9.9 10*3/uL (ref 4.0–10.5)
nRBC: 0 % (ref 0.0–0.2)

## 2020-05-07 LAB — PROTEIN / CREATININE RATIO, URINE
Creatinine, Urine: 278.95 mg/dL
Protein Creatinine Ratio: 4 mg/mg{Cre} — ABNORMAL HIGH (ref 0.00–0.15)
Total Protein, Urine: 1116 mg/dL

## 2020-05-07 LAB — GLUCOSE, RANDOM: Glucose, Bld: 87 mg/dL (ref 70–99)

## 2020-05-07 LAB — SARS CORONAVIRUS 2 BY RT PCR (HOSPITAL ORDER, PERFORMED IN ~~LOC~~ HOSPITAL LAB): SARS Coronavirus 2: NEGATIVE

## 2020-05-07 LAB — HEMOGLOBIN A1C
Hgb A1c MFr Bld: 7.7 % — ABNORMAL HIGH (ref 4.8–5.6)
Mean Plasma Glucose: 174.29 mg/dL

## 2020-05-07 MED ORDER — SODIUM CHLORIDE 0.9% FLUSH
3.0000 mL | Freq: Two times a day (BID) | INTRAVENOUS | Status: DC
  Administered 2020-05-07 – 2020-05-10 (×7): 3 mL via INTRAVENOUS

## 2020-05-07 MED ORDER — PRENATAL MULTIVITAMIN CH
1.0000 | ORAL_TABLET | Freq: Every day | ORAL | Status: DC
  Administered 2020-05-08 – 2020-05-10 (×3): 1 via ORAL
  Filled 2020-05-07 (×3): qty 1

## 2020-05-07 MED ORDER — INSULIN GLARGINE 100 UNIT/ML ~~LOC~~ SOLN
30.0000 [IU] | Freq: Every day | SUBCUTANEOUS | Status: DC
  Filled 2020-05-07: qty 0.3

## 2020-05-07 MED ORDER — CALCIUM CARBONATE ANTACID 500 MG PO CHEW: 2.0000 | CHEWABLE_TABLET | ORAL | Status: DC | PRN

## 2020-05-07 MED ORDER — SODIUM CHLORIDE 0.9 % IV SOLN: 250.0000 mL | INTRAVENOUS | Status: DC | PRN

## 2020-05-07 MED ORDER — ASPIRIN 81 MG PO CHEW
81.0000 mg | CHEWABLE_TABLET | Freq: Every day | ORAL | Status: DC
  Administered 2020-05-07 – 2020-05-10 (×4): 81 mg via ORAL
  Filled 2020-05-07 (×4): qty 1

## 2020-05-07 MED ORDER — NIFEDIPINE ER OSMOTIC RELEASE 30 MG PO TB24
60.0000 mg | ORAL_TABLET | Freq: Every day | ORAL | Status: DC
  Administered 2020-05-07 – 2020-05-09 (×3): 60 mg via ORAL
  Filled 2020-05-07 (×3): qty 2

## 2020-05-07 MED ORDER — GLUCOSE 40 % PO GEL
ORAL | Status: AC
  Filled 2020-05-07: qty 1

## 2020-05-07 MED ORDER — SODIUM CHLORIDE 0.9% FLUSH: 3.0000 mL | INTRAVENOUS | Status: DC | PRN

## 2020-05-07 MED ORDER — LABETALOL HCL 200 MG PO TABS
200.0000 mg | ORAL_TABLET | Freq: Three times a day (TID) | ORAL | Status: DC
  Administered 2020-05-07 – 2020-05-10 (×9): 200 mg via ORAL
  Filled 2020-05-07 (×9): qty 1

## 2020-05-07 MED ORDER — INSULIN GLARGINE 100 UNIT/ML ~~LOC~~ SOLN
15.0000 [IU] | Freq: Every day | SUBCUTANEOUS | Status: DC
  Administered 2020-05-07: 15 [IU] via SUBCUTANEOUS
  Filled 2020-05-07 (×2): qty 0.15

## 2020-05-07 MED ORDER — DOCUSATE SODIUM 100 MG PO CAPS
100.0000 mg | ORAL_CAPSULE | Freq: Every day | ORAL | Status: DC
  Administered 2020-05-07 – 2020-05-10 (×4): 100 mg via ORAL
  Filled 2020-05-07 (×4): qty 1

## 2020-05-07 MED ORDER — ZOLPIDEM TARTRATE 5 MG PO TABS: 5.0000 mg | ORAL_TABLET | Freq: Every evening | ORAL | Status: DC | PRN

## 2020-05-07 MED ORDER — INSULIN ASPART 100 UNIT/ML ~~LOC~~ SOLN: 20.0000 [IU] | Freq: Two times a day (BID) | SUBCUTANEOUS | Status: DC

## 2020-05-07 MED ORDER — FUROSEMIDE 40 MG PO TABS
40.0000 mg | ORAL_TABLET | Freq: Two times a day (BID) | ORAL | Status: DC
  Administered 2020-05-07 – 2020-05-10 (×6): 40 mg via ORAL
  Filled 2020-05-07 (×8): qty 1

## 2020-05-07 MED ORDER — POTASSIUM CHLORIDE CRYS ER 20 MEQ PO TBCR
20.0000 meq | EXTENDED_RELEASE_TABLET | Freq: Two times a day (BID) | ORAL | Status: DC
  Administered 2020-05-07 – 2020-05-10 (×6): 20 meq via ORAL
  Filled 2020-05-07 (×6): qty 1

## 2020-05-07 MED ORDER — INSULIN ASPART 100 UNIT/ML ~~LOC~~ SOLN: 6.0000 [IU] | Freq: Three times a day (TID) | SUBCUTANEOUS | Status: DC

## 2020-05-07 MED ORDER — ISOSORBIDE MONONITRATE ER 60 MG PO TB24
90.0000 mg | ORAL_TABLET | Freq: Every day | ORAL | Status: DC
  Administered 2020-05-07 – 2020-05-10 (×4): 90 mg via ORAL
  Filled 2020-05-07 (×4): qty 1

## 2020-05-07 MED ORDER — HYDRALAZINE HCL 50 MG PO TABS
50.0000 mg | ORAL_TABLET | Freq: Three times a day (TID) | ORAL | Status: DC
  Administered 2020-05-07 – 2020-05-10 (×9): 50 mg via ORAL
  Filled 2020-05-07 (×9): qty 1

## 2020-05-07 MED ORDER — ACETAMINOPHEN 325 MG PO TABS: 650.0000 mg | ORAL_TABLET | ORAL | Status: DC | PRN

## 2020-05-07 MED ORDER — INSULIN ASPART 100 UNIT/ML ~~LOC~~ SOLN: 0.0000 [IU] | Freq: Three times a day (TID) | SUBCUTANEOUS | Status: DC

## 2020-05-07 NOTE — H&P (Signed)
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  November 10, 1985 (34 yrs)  Name:       PEGGYE POON Aldredge              Visit Date: 04/29/2020 10:13 am ---------------------------------------------------------------------- Performed By  Attending:        Tama High MD        Ref. Address:     [address]  [place], [place]   Performed By:     Jacob Moores BS,       Location[facility]                    RDMS, RVT                                [facility] at                                                             [facility]                                                             [facility]  Referred By:      Chancy Milroy                    MD ---------------------------------------------------------------------- Orders  #  Description                           Code        Ordered By  1  [place] MFM OB FOLLOW UP                   96789.38    YU FANG  2  [place] MFM UA CORD DOPPLER                76820.02    YU FANG ----------------------------------------------------------------------  #  Order #                     Accession #                Episode #  1  [id number]                   [id number]                 [id number]  2  [id number]                   [id number]                 [id number] ---------------------------------------------------------------------- Indications  [redacted] weeks gestation of pregnancy  Z3A.29  Maternal care for known or suspected poor      O36.5931  fetal growth, third trimester, fetus 1 IUGR  Pre-existing diabetes, type 2, in pregnancy,   O24.19  unspecified trimester (insulin)  History of cesarean delivery, currently        O34.219  pregnant  Obesity complicating pregnancy, unspecified    O99.210  Hypertension - Chronic/Pre-existing            O10.019  Poor obstetric history: Previous gestational   O09.299  diabetes  Congestive Heart Failure  Small for gestational age fetus affecting      O29.5990  management of mother ---------------------------------------------------------------------- Vital Signs                                                 Height:        5'2" ---------------------------------------------------------------------- Fetal Evaluation  Num Of Fetuses:         1  Fetal Heart Rate(bpm):  147  Cardiac Activity:       Observed  Presentation:           Cephalic  Placenta:                Anterior  P. Cord Insertion:      Marginal insertion previously  Amniotic Fluid  AFI FV:      Within normal limits  AFI Sum(cm)     %Tile       Largest Pocket(cm)  22.58           91          7.84  RUQ(cm)       RLQ(cm)       LUQ(cm)        LLQ(cm)  7.84          2.85          5.46           6.44 ---------------------------------------------------------------------- Biometry  BPD:      70.3  mm     G. Age:  28w 2d          7  %    CI:        78.37   %    70 - 86                                                          FL/HC:      20.1   %    19.2 - 21.4  HC:      251.2  mm     G. Age:  27w 2d        < 1  %    HC/AC:      1.06        0.99 - 1.21  AC:      238.1  mm     G. Age:  28w 1d          9  %    FL/BPD:     72.0   %    71 - 87  FL:       50.6  mm     G. Age:  27w  1d        1.1  %    FL/AC:      21.3   %    20 - 24  LV:        7.1  mm  Est. FW:    1115  gm      2 lb 7 oz    2.6  % ---------------------------------------------------------------------- OB History  Gravidity:    2         Term:   1  Living:       1 ---------------------------------------------------------------------- Gestational Age  LMP:           29w 4d        Date:  10/05/19                 EDD:   07/11/20  U/S Today:     27w 5d                                        EDD:   07/24/20  Best:          29w 4d     Det. By:  LMP  (10/05/19)          EDD:   07/11/20 ---------------------------------------------------------------------- Anatomy  Cranium:               Previously seen        Aortic Arch:            Previously seen  Cavum:                 Previously seen        Ductal Arch:            Not well visualized  Ventricles:            Appears normal         Diaphragm:              Appears normal  Choroid Plexus:        Previously seen        Stomach:                Appears normal, left                                                                        sided  Cerebellum:            Previously seen        Abdomen:                 Previously seen  Posterior Fossa:       Previously seen        Abdominal Wall:         Previously seen  Nuchal Fold:           Previously seen        Cord Vessels:           Previously seen  Face:                  Orbits and profile     Kidneys:  Appear normal                         previously seen  Lips:                  Previously seen        Bladder:                Appears normal  Thoracic:              Previously seen        Spine:                  Previously seen  Heart:                 Previously seen        Upper Extremities:      RUE prev.vis; LUE                                                                        visualized  RVOT:                  Previously seen        Lower Extremities:      Previously seen  LVOT:                  Previously seen  Other:  Female gender previously visualized. Technically difficult due to          maternal habitus and fetal position. ---------------------------------------------------------------------- Doppler - Fetal Vessels  Umbilical Artery   S/D     %tile      RI    %tile                      PSV    ADFV    RDFV                                                     (cm/s)   6.62   > 97.5    0.85   > 97.5                     44.14      No      No ---------------------------------------------------------------------- Cervix Uterus Adnexa  Cervix  Not visualized (advanced GA >24wks)  Uterus  No abnormality visualized.  Right Ovary  No adnexal mass visualized.  Left Ovary  No adnexal mass visualized.  Cul De Sac  No free fluid seen.  Adnexa  No abnormality visualized. ---------------------------------------------------------------------- Impression  Pre-existing diabetes and patient takes insulin for control.  Fetal growth restriction.  On today's ultrasound, the estimated fetal weight is at the 3rd  percentile.  Interval weight gain is 368 g over 3 weeks  (suboptimal).  Amniotic fluid is normal good fetal activity  seen.  Umbilical artery Doppler showed  increased S/D ratio.  NST is nonreactive for this gestational age but no  decelerations are seen. ---------------------------------------------------------------------- Recommendations  UA Doppler and NST next week  -  BPP, UA Doppler and NST in 2 weeks and then weekly till  delivery. ----------------------------------------------------------------------                  Tama High, MD Electronically Signed Final Report   04/29/2020 01:05 pm ----------------------------------------------------------------------  [place] MFM UA CORD DOPPLER  Result Date: 04/22/2020 ----------------------------------------------------------------------  OBSTETRICS REPORT                       (Signed Final 04/22/2020 12:00 pm) ---------------------------------------------------------------------- Patient Info  ID #:       [id number]                          D.O.B.:  Jan 23, 1986 (34 yrs)  Name:       ANNALEI FRIESZ Chambless              Visit Date: 04/22/2020 09:48 am ---------------------------------------------------------------------- Performed By  Attending:        Johnell Comings MD         Ref. Address:     [place], [place]  Performed By:     Jeanene Erb BS,      Location[facility]                    RD[facility]                                     [facility] at                                                             [facility]                                                             [facility]  Referred By:      Chancy Milroy  MD ---------------------------------------------------------------------- Orders  #  Description                           Code        Ordered By  1  [place] MFM UA CORD DOPPLER                [phone]    YU FANG ----------------------------------------------------------------------   #  Order #                     Accession #                Episode #  1  [id number]                   [id number]                 [id number] ---------------------------------------------------------------------- Indications  Maternal care for known or suspected poor      O36.5931  fetal growth, third trimester, fetus 1 IUGR  Pre-existing diabetes, type 2, in pregnancy,   O24.19  unspecified trimester (insulin)  History of cesarean delivery, currently        O74.219  pregnant  Obesity complicating pregnancy, unspecified    O99.210  Hypertension - Chronic/Pre-existing            O10.019  Poor obstetric history: Previous gestational   O09.299  diabetes  Congestive Heart Failure  Small for gestational age fetus affecting      O51.5990  management of mother  [redacted] weeks gestation of pregnancy                Z3A.28 ---------------------------------------------------------------------- Vital Signs                                                 Height:        5'2" ---------------------------------------------------------------------- Fetal Evaluation  Num Of Fetuses:         1  Fetal Heart Rate(bpm):  150  Cardiac Activity:       Observed  Presentation:           Transverse, head to maternal left  Placenta:               Anterior  P. Cord Insertion:      Marginal insertion  Amniotic Fluid  AFI FV:      Within normal limits  AFI Sum(cm)     %Tile       Largest Pocket(cm)  19.49           76          5.91  RUQ(cm)       RLQ(cm)       LUQ(cm)        LLQ(cm)  5.91          3.32          5.9            4.36 ---------------------------------------------------------------------- OB History  Gravidity:    2         Term:   1  Living:       1 ---------------------------------------------------------------------- Gestational Age  LMP:           28w 4d        Date:  10/05/19  EDD:   07/11/20  Best:          Timothy Lasso 4d     Det. By:  LMP  (10/05/19)          EDD:   07/11/20  ---------------------------------------------------------------------- Anatomy  Stomach:               Visualized             Bladder:                Visualized ---------------------------------------------------------------------- Doppler - Fetal Vessels  Umbilical Artery   S/D     %tile      RI    %tile      PI    %tile     PSV    ADFV    RDFV                                                     (cm/s)    4.4       96     0.5    < 2.5     0.9       31     33.1      No      No ---------------------------------------------------------------------- Comments  This patient was seen due to an IUGR fetus.  Her pregnancy  has also been complicated by pregestational diabetes,  chronic hypertension, and history of congestive heart failure  following a myocardial infarction.  She denies any problems  since her last exam.  There was normal amniotic fluid noted on today's ultrasound  exam.  Doppler studies of the umbilical arteries performed due to  fetal growth restriction continues to show normal forward flow  with an elevated S/D ratio of 4.4.  There were no signs of  absent or reversed end-diastolic flow noted today.  The patient had a reactive nonstress test following today's  ultrasound exam.  Due to fetal growth restriction, we will continue to follow her  with weekly fetal testing and umbilical artery Doppler studies.  A follow-up exam was scheduled in 1 week. ----------------------------------------------------------------------                   Johnell Comings, MD Electronically Signed Final Report   04/22/2020 12:00 pm ----------------------------------------------------------------------    Assessment and Plan: IUP 30 %/& weeks IDDM, uncontrolled H/O CAD with stents H/O CHF CHTN H/O c section IUGR Unwanted fertility   Admit to Antenatal for glycemic control. DM education and dietary consult. Continue with home meds. Adjust insulin as necessary for control.  POC reviewed with pt.    Elenora Hawbaker L. Rip Harbour,  MD, [place] Attending [place], [facility]

## 2020-05-07 NOTE — Progress Notes (Signed)
Hypoglycemic Event  CBG: 23  Treatment: 8 oz. juice  Symptoms: profuse sweating, lethargy  Follow-up CBG: Time:1752 CBG Result:76  Possible Reasons for Event: didn't eat before arrival at hospital today  Comments/MD notified: Dr. Neita Goodnight

## 2020-05-08 DIAGNOSIS — Z3A30 30 weeks gestation of pregnancy: Secondary | ICD-10-CM | POA: Diagnosis not present

## 2020-05-08 DIAGNOSIS — R7309 Other abnormal glucose: Secondary | ICD-10-CM | POA: Diagnosis not present

## 2020-05-08 DIAGNOSIS — O99810 Abnormal glucose complicating pregnancy: Secondary | ICD-10-CM

## 2020-05-08 DIAGNOSIS — O24410 Gestational diabetes mellitus in pregnancy, diet controlled: Secondary | ICD-10-CM | POA: Diagnosis not present

## 2020-05-08 LAB — GLUCOSE, CAPILLARY
Glucose-Capillary: 124 mg/dL — ABNORMAL HIGH (ref 70–99)
Glucose-Capillary: 152 mg/dL — ABNORMAL HIGH (ref 70–99)
Glucose-Capillary: 179 mg/dL — ABNORMAL HIGH (ref 70–99)
Glucose-Capillary: 84 mg/dL (ref 70–99)

## 2020-05-08 MED ORDER — INSULIN ASPART 100 UNIT/ML ~~LOC~~ SOLN
10.0000 [IU] | Freq: Three times a day (TID) | SUBCUTANEOUS | Status: DC
  Administered 2020-05-08 (×3): 10 [IU] via SUBCUTANEOUS

## 2020-05-08 MED ORDER — INSULIN ASPART 100 UNIT/ML ~~LOC~~ SOLN
12.0000 [IU] | Freq: Three times a day (TID) | SUBCUTANEOUS | Status: DC
  Administered 2020-05-09 (×2): 12 [IU] via SUBCUTANEOUS

## 2020-05-08 MED ORDER — INSULIN ASPART 100 UNIT/ML ~~LOC~~ SOLN
0.0000 [IU] | Freq: Three times a day (TID) | SUBCUTANEOUS | Status: DC
  Administered 2020-05-08 (×2): 4 [IU] via SUBCUTANEOUS

## 2020-05-08 MED ORDER — INSULIN GLARGINE 100 UNIT/ML ~~LOC~~ SOLN
20.0000 [IU] | Freq: Every day | SUBCUTANEOUS | Status: DC
  Administered 2020-05-08: 20 [IU] via SUBCUTANEOUS
  Filled 2020-05-08 (×2): qty 0.2

## 2020-05-08 NOTE — Progress Notes (Signed)
Inpatient Diabetes Program Recommendations  AACE/ADA: New Consensus Statement on Inpatient Glycemic Control (2015)  Target Ranges:  Prepandial:   less than 140 mg/dL      Peak postprandial:   less than 180 mg/dL (1-2 hours)      Critically ill patients:  140 - 180 mg/dL   Lab Results  Component Value Date   GLUCAP 152 (H) 05/08/2020   HGBA1C 7.7 (H) 05/07/2020    Review of Glycemic Control  Diabetes history: type 2 Outpatient Diabetes medications: Lantus 30 units at HS, Novolog 20 units with each meal Current orders for Inpatient glycemic control: Lantus 15 units at HS, Novolog RESISTANT correction scale TID, Novolog 10 units TID with meals  Inpatient Diabetes Program Recommendations:   Spoke with patient on the phone. States that she had been on Lantus and Novolog prior to pregnancy. States that she had taken Novolog yesterday before coming to the hospital and had not eaten after taking it.   Recommend changing orders to Diabetes Treatment for pregnant/postpartum patients order set.  Recommend Novolog 0-14 units scale (per pregnancy order set) to be given 2 hours postprandial TID. Continue Lantus 15 units at HS and Novolog 10 units TID with meals. May need to titrate dosages as needed.   Harvel Ricks RN BSN CDE Diabetes Coordinator Pager: [phone]  8am-5pm

## 2020-05-08 NOTE — Progress Notes (Signed)
Patient ID: [name], female   DOB: 15-May-1986, 34 y.o.   MRN: [id number] [place] COMPREHENSIVE PROGRESS NOTE  TERIE LEAR is a 34 y.o. G2P1001 at [redacted]w[redacted]d  who is admitted for glycemic control.   Fetal presentation is cephalic. Length of Stay:  1  Days  Subjective: Pt has no complaints this morning Patient reports good fetal movement.  She reports no uterine contractions, no bleeding and no loss of fluid per vagina.  Vitals:  Blood pressure (!) 138/59, pulse 91, temperature 98.2 F (36.8 C), temperature source Oral, resp. rate 20, height 5\' 2"  (1.575 m), weight 128.9 kg, last menstrual period 10/05/2019, SpO2 99 %.   Physical Examination: Lungs clear Heart RRR Abd soft + BS gravid Ext non tender  Fetal Monitoring:  120-130's, + accels, no ut ctx  Labs:  Results for orders placed or performed during the hospital encounter of 05/07/20 (from the past 24 hour(s))  Comprehensive metabolic panel   Collection Time: 05/07/20  3:36 PM  Result Value Ref Range   Sodium 138 135 - 145 mmol/L   Potassium 3.2 (L) 3.5 - 5.1 mmol/L   Chloride 105 98 - 111 mmol/L   CO2 22 22 - 32 mmol/L   Glucose, Bld 78 70 - 99 mg/dL   BUN 11 6 - 20 mg/dL   Creatinine, Ser 0.86 0.44 - 1.00 mg/dL   Calcium 9.0 8.9 - 10.3 mg/dL   Total Protein 6.5 6.5 - 8.1 g/dL   Albumin 2.2 (L) 3.5 - 5.0 g/dL   AST 15 15 - 41 U/L   ALT 16 0 - 44 U/L   Alkaline Phosphatase 84 38 - 126 U/L   Total Bilirubin 0.4 0.3 - 1.2 mg/dL   GFR calc non Af Amer >60 >60 mL/min   GFR calc Af Amer >60 >60 mL/min   Anion gap 11 5 - 15  CBC on admission   Collection Time: 05/07/20  3:36 PM  Result Value Ref Range   WBC 9.9 4.0 - 10.5 K/uL   RBC 3.38 (L) 3.87 - 5.11 MIL/uL   Hemoglobin 10.0 (L) 12.0 - 15.0 g/dL   HCT 29.6 (L) 36 - 46 %   MCV 87.6 80.0 - 100.0 fL   MCH 29.6 26.0 - 34.0 pg   MCHC 33.8 30.0 - 36.0 g/dL   RDW 13.1 11.5 - 15.5 %   Platelets 336 150 - 400 K/uL   nRBC 0.0 0.0 - 0.2 %   Hemoglobin A1c   Collection Time: 05/07/20  3:36 PM  Result Value Ref Range   Hgb A1c MFr Bld 7.7 (H) 4.8 - 5.6 %   Mean Plasma Glucose 174.29 mg/dL  Protein / creatinine ratio, urine   Collection Time: 05/07/20  4:41 PM  Result Value Ref Range   Creatinine, Urine 278.95 mg/dL   Total Protein, Urine 1,116 mg/dL   Protein Creatinine Ratio 4.00 (H) 0.00 - 0.15 mg/mg[Cre]  Glucose, capillary   Collection Time: 05/07/20  5:36 PM  Result Value Ref Range   Glucose-Capillary 23 (LL) 70 - 99 mg/dL  Glucose, capillary   Collection Time: 05/07/20  5:52 PM  Result Value Ref Range   Glucose-Capillary 76 70 - 99 mg/dL  Glucose, random   Collection Time: 05/07/20  6:09 PM  Result Value Ref Range   Glucose, Bld 87 70 - 99 mg/dL  SARS Coronavirus 2 by RT PCR (hospital order, performed in [place] hospital lab) Nasopharyngeal Nasopharyngeal Swab   Collection Time:  05/07/20  8:24 PM   Specimen: Nasopharyngeal Swab  Result Value Ref Range   SARS Coronavirus 2 NEGATIVE NEGATIVE  Glucose, capillary   Collection Time: 05/07/20 10:21 PM  Result Value Ref Range   Glucose-Capillary 190 (H) 70 - 99 mg/dL  Glucose, capillary   Collection Time: 05/08/20  6:10 AM  Result Value Ref Range   Glucose-Capillary 124 (H) 70 - 99 mg/dL    Imaging Studies:    NA   Medications:  Scheduled . aspirin  81 mg Oral Daily  . docusate sodium  100 mg Oral Daily  . furosemide  40 mg Oral BID  . hydrALAZINE  50 mg Oral TID  . insulin aspart  0-20 Units Subcutaneous TID WC  . insulin aspart  10 Units Subcutaneous TID WC  . insulin glargine  15 Units Subcutaneous QHS  . isosorbide mononitrate  90 mg Oral Daily  . labetalol  200 mg Oral TID  . NIFEdipine  60 mg Oral QHS  . potassium chloride  20 mEq Oral BID  . prenatal multivitamin  1 tablet Oral Q1200  . sodium chloride flush  3 mL Intravenous Q12H   I have reviewed the patient's current medications.  ASSESSMENT: IUP 30 6/7 weeks IDDM,  uncontrolled H/O CAD with stents in place H/O CHF CHTN IUGR H/O C section Unwanted fertility  PLAN: Stable. Continue to adjust insulin for optimal control. Continue routine antenatal care.   Chancy Milroy 05/08/2020,7:41 AM

## 2020-05-08 NOTE — Progress Notes (Signed)
Nutrition consult for diet education acknowledged  Consult will be completed on Tuesday 9/7 when RD is back in house  Pt has had previous diet education, the latest on 12/11/19  Weyman Rodney M.Fredderick Severance LDN Neonatal Nutrition Support Specialist/RD III

## 2020-05-09 DIAGNOSIS — O24313 Unspecified pre-existing diabetes mellitus in pregnancy, third trimester: Secondary | ICD-10-CM | POA: Diagnosis not present

## 2020-05-09 DIAGNOSIS — O24410 Gestational diabetes mellitus in pregnancy, diet controlled: Secondary | ICD-10-CM | POA: Diagnosis not present

## 2020-05-09 DIAGNOSIS — Z3A31 31 weeks gestation of pregnancy: Secondary | ICD-10-CM | POA: Diagnosis not present

## 2020-05-09 LAB — GLUCOSE, CAPILLARY
Glucose-Capillary: 98 mg/dL (ref 70–99)
Glucose-Capillary: 119 mg/dL — ABNORMAL HIGH (ref 70–99)
Glucose-Capillary: 94 mg/dL (ref 70–99)
Glucose-Capillary: 114 mg/dL — ABNORMAL HIGH (ref 70–99)

## 2020-05-09 MED ORDER — INSULIN ASPART 100 UNIT/ML ~~LOC~~ SOLN
0.0000 [IU] | Freq: Three times a day (TID) | SUBCUTANEOUS | Status: DC
  Administered 2020-05-09 (×2): 1 [IU] via SUBCUTANEOUS

## 2020-05-09 MED ORDER — INSULIN NPH (HUMAN) (ISOPHANE) 100 UNIT/ML ~~LOC~~ SUSP: 15.0000 [IU] | Freq: Every day | SUBCUTANEOUS | Status: DC

## 2020-05-09 MED ORDER — INSULIN GLARGINE 100 UNIT/ML ~~LOC~~ SOLN
15.0000 [IU] | Freq: Every day | SUBCUTANEOUS | Status: DC
  Administered 2020-05-09: 15 [IU] via SUBCUTANEOUS
  Filled 2020-05-09 (×2): qty 0.15

## 2020-05-09 MED ORDER — INSULIN ASPART 100 UNIT/ML ~~LOC~~ SOLN
10.0000 [IU] | Freq: Three times a day (TID) | SUBCUTANEOUS | Status: DC
  Administered 2020-05-09 – 2020-05-10 (×2): 10 [IU] via SUBCUTANEOUS

## 2020-05-09 NOTE — Progress Notes (Signed)
[facility] NOTE  Lauren Delgado is a 34 y.o. G2P1001 at [redacted]w[redacted]d who is admitted for control of blood sugars.  Estimated Date of Delivery: 07/11/20 Fetal presentation is cephalic.  Length of Stay:  2 Days. Admitted 05/07/2020  Subjective: Pt seen in the afternoon.  No acute complaints. Patient reports normal fetal movement.  She denies uterine contractions, denies bleeding and leaking of fluid per vagina.  Vitals:  Blood pressure 139/71, pulse 92, temperature 98.3 F (36.8 C), temperature source Oral, resp. rate 18, height 5\' 2"  (1.575 m), weight 128.9 kg, last menstrual period 10/05/2019, SpO2 100 %. Physical Examination: CONSTITUTIONAL: Well-developed, well-nourished female in no acute distress.  HENT:  Normocephalic, atraumatic, External right and left ear normal. Oropharynx is clear and moist EYES: Conjunctivae and EOM are normal. Pupils are equal, round, and reactive to light. No scleral icterus.  NECK: Normal range of motion, supple, no masses. SKIN: Skin is warm and dry. No rash noted. Not diaphoretic. No erythema. No pallor. [place]: Alert and oriented to person, place, and time. Normal reflexes, muscle tone coordination. No cranial nerve deficit noted. PSYCHIATRIC: Normal mood and affect. Normal behavior. Normal judgment and thought content. CARDIOVASCULAR: Normal heart rate noted, regular rhythm RESPIRATORY: Effort and breath sounds normal, no problems with respiration noted MUSCULOSKELETAL: Normal range of motion. No edema and no tenderness. ABDOMEN: Soft, nontender, nondistended, gravid, obese. CERVIX: deferred  Fetal monitoring: FHR: 130 bpm, Variability: moderate, Accelerations: Present, Decelerations: Absent  Uterine activity: none  Results for orders placed or performed during the hospital encounter of 05/07/20 (from the past 48 hour(s))  Comprehensive metabolic panel     Status: Abnormal   Collection Time: 05/07/20  3:36 PM  Result Value Ref  Range   Sodium 138 135 - 145 mmol/L   Potassium 3.2 (L) 3.5 - 5.1 mmol/L   Chloride 105 98 - 111 mmol/L   CO2 22 22 - 32 mmol/L   Glucose, Bld 78 70 - 99 mg/dL    Comment: Glucose reference range applies only to samples taken after fasting for at least 8 hours.   BUN 11 6 - 20 mg/dL   Creatinine, Ser 0.86 0.44 - 1.00 mg/dL   Calcium 9.0 8.9 - 10.3 mg/dL   Total Protein 6.5 6.5 - 8.1 g/dL   Albumin 2.2 (L) 3.5 - 5.0 g/dL   AST 15 15 - 41 U/L   ALT 16 0 - 44 U/L   Alkaline Phosphatase 84 38 - 126 U/L   Total Bilirubin 0.4 0.3 - 1.2 mg/dL   GFR calc non Af Amer >60 >60 mL/min   GFR calc Af Amer >60 >60 mL/min   Anion gap 11 5 - 15    Comment: Performed at [facility], [place] [address], [place], [place] [address]  CBC on admission     Status: Abnormal   Collection Time: 05/07/20  3:36 PM  Result Value Ref Range   WBC 9.9 4.0 - 10.5 K/uL   RBC 3.38 (L) 3.87 - 5.11 MIL/uL   Hemoglobin 10.0 (L) 12.0 - 15.0 g/dL   HCT 29.6 (L) 36 - 46 %   MCV 87.6 80.0 - 100.0 fL   MCH 29.6 26.0 - 34.0 pg   MCHC 33.8 30.0 - 36.0 g/dL   RDW 13.1 11.5 - 15.5 %   Platelets 336 150 - 400 K/uL   nRBC 0.0 0.0 - 0.2 %    Comment: Performed at [facility], [place] [address]  [place], [place] [address]  Hemoglobin A1c     Status: Abnormal   Collection Time: 05/07/20  3:36 PM  Result Value Ref Range   Hgb A1c MFr Bld 7.7 (H) 4.8 - 5.6 %    Comment: (NOTE) Pre diabetes:          5.7%-6.4%  Diabetes:              >6.4%  Glycemic control for   <7.0% adults with diabetes    Mean Plasma Glucose 174.29 mg/dL    Comment: Performed at [facility] [address] [place], [place] [address]  Protein / creatinine ratio, urine     Status: Abnormal   Collection Time: 05/07/20  4:41 PM  Result Value Ref Range   Creatinine, Urine 278.95 mg/dL   Total Protein, Urine 1,116 mg/dL    Comment: NO NORMAL RANGE ESTABLISHED FOR THIS TEST RESULTS CONFIRMED BY MANUAL DILUTION    Protein Creatinine Ratio  4.00 (H) 0.00 - 0.15 mg/mg[Cre]    Comment: Performed at [facility] [address] [place], [place] [address]  Urine culture     Status: Abnormal (Preliminary result)   Collection Time: 05/07/20  4:41 PM   Specimen: Urine, Random  Result Value Ref Range   Specimen Description URINE, RANDOM    Special Requests      NONE Performed at [facility], [place] [address], [place], [place] [address]    Culture >=100,000 COLONIES/mL ESCHERICHIA COLI (A)    Report Status PENDING   Glucose, capillary     Status: Abnormal   Collection Time: 05/07/20  5:36 PM  Result Value Ref Range   Glucose-Capillary 23 (LL) 70 - 99 mg/dL    Comment: Glucose reference range applies only to samples taken after fasting for at least 8 hours.  Glucose, capillary     Status: None   Collection Time: 05/07/20  5:52 PM  Result Value Ref Range   Glucose-Capillary 76 70 - 99 mg/dL    Comment: Glucose reference range applies only to samples taken after fasting for at least 8 hours.  Glucose, random     Status: None   Collection Time: 05/07/20  6:09 PM  Result Value Ref Range   Glucose, Bld 87 70 - 99 mg/dL    Comment: Glucose reference range applies only to samples taken after fasting for at least 8 hours. Performed at [facility], [place] [address], [place], [place] [address]   SARS Coronavirus 2 by RT PCR (hospital order, performed in [facility] hospital lab) Nasopharyngeal Nasopharyngeal Swab     Status: None   Collection Time: 05/07/20  8:24 PM   Specimen: Nasopharyngeal Swab  Result Value Ref Range   SARS Coronavirus 2 NEGATIVE NEGATIVE    Comment: (NOTE) SARS-CoV-2 target nucleic acids are NOT DETECTED.  The SARS-CoV-2 RNA is generally detectable in upper and lower respiratory specimens during the acute phase of infection. The lowest concentration of SARS-CoV-2 viral copies this assay can detect is 250 copies / mL. A negative result does not preclude SARS-CoV-2 infection and should not be  used as the sole basis for treatment or other patient management decisions.  A negative result may occur with improper specimen collection / handling, submission of specimen other than nasopharyngeal swab, presence of viral mutation(s) within the areas targeted by this assay, and inadequate number of viral copies (<250 copies / mL). A negative result must be combined with clinical observations, patient history, and  epidemiological information.  Fact Sheet for Patients:   StrictlyIdeas.no  Fact Sheet for Healthcare Providers: BankingDealers.co.za  This test is not yet approved or  cleared by the [place] FDA and has been authorized for detection and/or diagnosis of SARS-CoV-2 by FDA under an Emergency Use Authorization (EUA).  This EUA will remain in effect (meaning this test can be used) for the duration of the COVID-19 declaration under Section 564(b)(1) of the Act, 21 U.S.C. section 360bbb-3(b)(1), unless the authorization is terminated or revoked sooner.  Performed at [facility], [place] [address], [place], [place] [address]   Glucose, capillary     Status: Abnormal   Collection Time: 05/07/20 10:21 PM  Result Value Ref Range   Glucose-Capillary 190 (H) 70 - 99 mg/dL    Comment: Glucose reference range applies only to samples taken after fasting for at least 8 hours.  Glucose, capillary     Status: Abnormal   Collection Time: 05/08/20  6:10 AM  Result Value Ref Range   Glucose-Capillary 124 (H) 70 - 99 mg/dL    Comment: Glucose reference range applies only to samples taken after fasting for at least 8 hours.  Glucose, capillary     Status: Abnormal   Collection Time: 05/08/20  9:15 AM  Result Value Ref Range   Glucose-Capillary 152 (H) 70 - 99 mg/dL    Comment: Glucose reference range applies only to samples taken after fasting for at least 8 hours.  Glucose, capillary     Status: Abnormal   Collection Time: 05/08/20   2:31 PM  Result Value Ref Range   Glucose-Capillary 179 (H) 70 - 99 mg/dL    Comment: Glucose reference range applies only to samples taken after fasting for at least 8 hours.  Glucose, capillary     Status: None   Collection Time: 05/08/20  8:00 PM  Result Value Ref Range   Glucose-Capillary 84 70 - 99 mg/dL    Comment: Glucose reference range applies only to samples taken after fasting for at least 8 hours.  Glucose, capillary     Status: None   Collection Time: 05/09/20  6:09 AM  Result Value Ref Range   Glucose-Capillary 98 70 - 99 mg/dL    Comment: Glucose reference range applies only to samples taken after fasting for at least 8 hours.  Glucose, capillary     Status: Abnormal   Collection Time: 05/09/20 10:14 AM  Result Value Ref Range   Glucose-Capillary 119 (H) 70 - 99 mg/dL    Comment: Glucose reference range applies only to samples taken after fasting for at least 8 hours.   Comment 1 Notify RN     I have reviewed the patient's current medications.  ASSESSMENT: Active Problems:   Poor glycemic control   PLAN: Diabetes management team contacted MD and left recommendations.  These changes have been implemented, but blood sugars already looked improved.  Will continue to monitor. Reassess in the AM   Continue routine antenatal care.   Lynnda Shields, MD [place] Attending, [facility] 05/09/2020 3:24 PM

## 2020-05-10 ENCOUNTER — Telehealth: Payer: Self-pay | Admitting: Radiology

## 2020-05-10 DIAGNOSIS — Z794 Long term (current) use of insulin: Secondary | ICD-10-CM

## 2020-05-10 DIAGNOSIS — O2343 Unspecified infection of urinary tract in pregnancy, third trimester: Secondary | ICD-10-CM | POA: Diagnosis not present

## 2020-05-10 DIAGNOSIS — Z9114 Patient's other noncompliance with medication regimen: Secondary | ICD-10-CM

## 2020-05-10 DIAGNOSIS — E119 Type 2 diabetes mellitus without complications: Secondary | ICD-10-CM

## 2020-05-10 DIAGNOSIS — B9620 Unspecified Escherichia coli [E. coli] as the cause of diseases classified elsewhere: Secondary | ICD-10-CM

## 2020-05-10 DIAGNOSIS — O24113 Pre-existing diabetes mellitus, type 2, in pregnancy, third trimester: Secondary | ICD-10-CM

## 2020-05-10 DIAGNOSIS — Z3A31 31 weeks gestation of pregnancy: Secondary | ICD-10-CM

## 2020-05-10 DIAGNOSIS — O24410 Gestational diabetes mellitus in pregnancy, diet controlled: Secondary | ICD-10-CM | POA: Diagnosis not present

## 2020-05-10 LAB — URINE CULTURE: Culture: >=100000 — AB

## 2020-05-10 LAB — GLUCOSE, CAPILLARY
Glucose-Capillary: 89 mg/dL (ref 70–99)
Glucose-Capillary: 83 mg/dL (ref 70–99)

## 2020-05-10 MED ORDER — INSULIN ASPART 100 UNIT/ML ~~LOC~~ SOLN: 10.0000 [IU] | Freq: Three times a day (TID) | SUBCUTANEOUS | 11 refills | Status: DC

## 2020-05-10 MED ORDER — INSULIN GLARGINE 100 UNIT/ML ~~LOC~~ SOLN: 15.0000 [IU] | Freq: Every day | SUBCUTANEOUS | 11 refills | Status: DC

## 2020-05-10 MED ORDER — CEFADROXIL 500 MG PO CAPS: 500.0000 mg | ORAL_CAPSULE | Freq: Two times a day (BID) | ORAL | 0 refills | Status: DC

## 2020-05-10 NOTE — Telephone Encounter (Signed)
Tried calling patient to schedule OB appt with Dr Kennon Rounds per Dr Arther Abbott request, phone number is invalid. Sent mychart message for patient to contact [email or web address].

## 2020-05-10 NOTE — Discharge Summary (Signed)
Antenatal Physician Discharge Summary  Patient ID: Lauren Delgado MRN: [id number] DOB/AGE: 04-Apr-1986 34 y.o.  Admit date: 05/07/2020 Discharge date: 05/10/2020  Admission Diagnoses:  Noncomplicance with insulin and diet, T2DM in third trimester  Discharge Diagnoses:  The same, asymptomatic bacteruria in third trimester  Prenatal Procedures: NST  Consults: Diabetic Coordinatoryh  Hospital Course:  Lauren Delgado is a 34 y.o. G2P1001 with IUP at [redacted]w[redacted]d admitted for glycemic control. Complicated cardiac medical history.  Her insulin regimen was changes and she was a diabetic diet.  Was seen by Diabetic Coordinator who helped with her regimen. By the time of discharge, CBGs were markedly improved. Emphasized importance of adherence to diet and insulin regimen.  Of note, her admission urine culture resulted with pan-sensitive E.coli, she as prescribed antibiotics to treat this.   her fetal heart rate monitoring remained reassuring, and she had no signs/symptoms of preterm labor or other maternal-fetal concerns.  She was deemed stable for discharge to home with outpatient follow up.  Discharge Exam: Temp:  [97.6 F (36.4 C)-98.3 F (36.8 C)] 98.2 F (36.8 C) (09/07 0846) Pulse Rate:  [90-94] 93 (09/07 0846) Resp:  [18] 18 (09/07 0846) BP: (131-155)/(56-91) 135/56 (09/07 0846) SpO2:  [99 %-100 %] 100 % (09/07 0846) Physical Examination: CONSTITUTIONAL: Well-developed, well-nourished female in no acute distress.  HENT:  Normocephalic, atraumatic, External right and left ear normal. Oropharynx is clear and moist EYES: Conjunctivae and EOM are normal. Pupils are equal, round, and reactive to light. No scleral icterus.  NECK: Normal range of motion, supple, no masses SKIN: Skin is warm and dry. No rash noted. Not diaphoretic. No erythema. No pallor. [place]: Alert and oriented to person, place, and time. Normal reflexes, muscle tone coordination. No cranial nerve deficit  noted. PSYCHIATRIC: Normal mood and affect. Normal behavior. Normal judgment and thought content. CARDIOVASCULAR: Normal heart rate noted, regular rhythm RESPIRATORY: Effort and breath sounds normal, no problems with respiration noted MUSCULOSKELETAL: Normal range of motion. No edema and no tenderness. 2+ distal pulses. ABDOMEN: Soft, nontender, nondistended, gravid. CERVIX:  Deferred  Fetal monitoring: FHR: 135 bpm, Variability: moderate, Accelerations: Present, Decelerations: Absent  Uterine activity: No contractions  Significant Diagnostic Studies:  Results for orders placed or performed during the hospital encounter of 05/07/20 (from the past 168 hour(s))  Comprehensive metabolic panel   Collection Time: 05/07/20  3:36 PM  Result Value Ref Range   Sodium 138 135 - 145 mmol/L   Potassium 3.2 (L) 3.5 - 5.1 mmol/L   Chloride 105 98 - 111 mmol/L   CO2 22 22 - 32 mmol/L   Glucose, Bld 78 70 - 99 mg/dL   BUN 11 6 - 20 mg/dL   Creatinine, Ser 0.86 0.44 - 1.00 mg/dL   Calcium 9.0 8.9 - 10.3 mg/dL   Total Protein 6.5 6.5 - 8.1 g/dL   Albumin 2.2 (L) 3.5 - 5.0 g/dL   AST 15 15 - 41 U/L   ALT 16 0 - 44 U/L   Alkaline Phosphatase 84 38 - 126 U/L   Total Bilirubin 0.4 0.3 - 1.2 mg/dL   GFR calc non Af Amer >60 >60 mL/min   GFR calc Af Amer >60 >60 mL/min   Anion gap 11 5 - 15  CBC on admission   Collection Time: 05/07/20  3:36 PM  Result Value Ref Range   WBC 9.9 4.0 - 10.5 K/uL   RBC 3.38 (L) 3.87 - 5.11 MIL/uL   Hemoglobin 10.0 (L) 12.0 - 15.0 g/dL  HCT 29.6 (L) 36 - 46 %   MCV 87.6 80.0 - 100.0 fL   MCH 29.6 26.0 - 34.0 pg   MCHC 33.8 30.0 - 36.0 g/dL   RDW 13.1 11.5 - 15.5 %   Platelets 336 150 - 400 K/uL   nRBC 0.0 0.0 - 0.2 %  Hemoglobin A1c   Collection Time: 05/07/20  3:36 PM  Result Value Ref Range   Hgb A1c MFr Bld 7.7 (H) 4.8 - 5.6 %   Mean Plasma Glucose 174.29 mg/dL  Urine culture   Collection Time: 05/07/20  4:41 PM   Specimen: Urine, Random  Result Value  Ref Range   Specimen Description URINE, RANDOM    Special Requests      NONE Performed at [facility], [address], [place], [place] [address]    Culture >=100,000 COLONIES/mL ESCHERICHIA COLI (A)    Report Status 05/10/2020 FINAL    Organism ID, Bacteria ESCHERICHIA COLI (A)       Susceptibility   Escherichia coli - MIC*    AMPICILLIN 4 SENSITIVE Sensitive     CEFAZOLIN <=4 SENSITIVE Sensitive     CEFEPIME <=0.12 SENSITIVE Sensitive     CEFTAZIDIME <=1 SENSITIVE Sensitive     CEFTRIAXONE <=0.25 SENSITIVE Sensitive     CIPROFLOXACIN <=0.25 SENSITIVE Sensitive     GENTAMICIN <=1 SENSITIVE Sensitive     IMIPENEM <=0.25 SENSITIVE Sensitive     TRIMETH/SULFA <=20 SENSITIVE Sensitive     AMPICILLIN/SULBACTAM <=2 SENSITIVE Sensitive     PIP/TAZO <=4 SENSITIVE Sensitive     * >=100,000 COLONIES/mL ESCHERICHIA COLI  Protein / creatinine ratio, urine   Collection Time: 05/07/20  4:41 PM  Result Value Ref Range   Creatinine, Urine 278.95 mg/dL   Total Protein, Urine 1,116 mg/dL   Protein Creatinine Ratio 4.00 (H) 0.00 - 0.15 mg/mg[Cre]  Glucose, capillary   Collection Time: 05/07/20  5:36 PM  Result Value Ref Range   Glucose-Capillary 23 (LL) 70 - 99 mg/dL  Glucose, capillary   Collection Time: 05/07/20  5:52 PM  Result Value Ref Range   Glucose-Capillary 76 70 - 99 mg/dL  Glucose, random   Collection Time: 05/07/20  6:09 PM  Result Value Ref Range   Glucose, Bld 87 70 - 99 mg/dL  SARS Coronavirus 2 by RT PCR (hospital order, performed in [facility] lab) Nasopharyngeal Nasopharyngeal Swab   Collection Time: 05/07/20  8:24 PM   Specimen: Nasopharyngeal Swab  Result Value Ref Range   SARS Coronavirus 2 NEGATIVE NEGATIVE  Glucose, capillary   Collection Time: 05/07/20 10:21 PM  Result Value Ref Range   Glucose-Capillary 190 (H) 70 - 99 mg/dL  Glucose, capillary   Collection Time: 05/08/20  6:10 AM  Result Value Ref Range   Glucose-Capillary 124 (H) 70 - 99  mg/dL  Glucose, capillary   Collection Time: 05/08/20  9:15 AM  Result Value Ref Range   Glucose-Capillary 152 (H) 70 - 99 mg/dL  Glucose, capillary   Collection Time: 05/08/20  2:31 PM  Result Value Ref Range   Glucose-Capillary 179 (H) 70 - 99 mg/dL  Glucose, capillary   Collection Time: 05/08/20  8:00 PM  Result Value Ref Range   Glucose-Capillary 84 70 - 99 mg/dL  Glucose, capillary   Collection Time: 05/09/20  6:09 AM  Result Value Ref Range   Glucose-Capillary 98 70 - 99 mg/dL  Glucose, capillary   Collection Time: 05/09/20 10:14 AM  Result Value Ref Range  Glucose-Capillary 119 (H) 70 - 99 mg/dL   Comment 1 Notify RN   Glucose, capillary   Collection Time: 05/09/20  4:11 PM  Result Value Ref Range   Glucose-Capillary 94 70 - 99 mg/dL  Glucose, capillary   Collection Time: 05/09/20  8:19 PM  Result Value Ref Range   Glucose-Capillary 114 (H) 70 - 99 mg/dL  Glucose, capillary   Collection Time: 05/10/20  8:55 AM  Result Value Ref Range   Glucose-Capillary 89 70 - 99 mg/dL   [place] MFM OB FOLLOW UP  Result Date: 04/29/2020 ----------------------------------------------------------------------  OBSTETRICS REPORT                       (Signed Final 04/29/2020 01:05 pm) ---------------------------------------------------------------------- Patient Info  ID #:       [id number]                          D.O.B.:  1986/01/05 (34 yrs)  Name:       Real Cons Frasco              Visit Date: 04/29/2020 10:13 am ---------------------------------------------------------------------- Performed By  Attending:        Tama High MD        Ref. Address:     [place], [place]  Performed By:     Jacob Moores BS,       [facility]                    RDMS, RVT                                 [facility] at                                                             [facility]  Referred By:      Chancy Milroy                    MD ---------------------------------------------------------------------- Orders  #  Description                           Code        Ordered By  1  [place] MFM OB FOLLOW UP                   [phone]    YU FANG  2  [place] MFM UA CORD DOPPLER                76820.02    YU FANG ----------------------------------------------------------------------  #  Order #                     Accession #                Episode #  1  [id number]                   [id number]                 [id number]  2  [id number]                   [id number]                 [id number] ---------------------------------------------------------------------- Indications  [redacted] weeks gestation of pregnancy                Z3A.29  Maternal care for known or suspected poor      O36.5931  fetal growth, third trimester, fetus 1 IUGR  Pre-existing diabetes, type 2, in pregnancy,   O24.19  unspecified trimester (insulin)  History of cesarean delivery, currently        O34.219  pregnant  Obesity complicating pregnancy, unspecified    O99.210  Hypertension - Chronic/Pre-existing            O10.019  Poor obstetric history: Previous gestational   O09.299  diabetes  Congestive Heart Failure  Small for gestational age fetus affecting      O75.5990  management of mother ---------------------------------------------------------------------- Vital Signs                                                 Height:        5'2" ---------------------------------------------------------------------- Fetal Evaluation  Num Of Fetuses:         1  Fetal Heart Rate(bpm):  147  Cardiac Activity:       Observed  Presentation:           Cephalic  Placenta:               Anterior  P. Cord Insertion:      Marginal insertion previously  Amniotic Fluid  AFI FV:      Within normal  limits  AFI Sum(cm)     %Tile       Largest Pocket(cm)  22.58           91          7.84  RUQ(cm)       RLQ(cm)       LUQ(cm)  LLQ(cm)  7.84          2.85          5.46           6.44 ---------------------------------------------------------------------- Biometry  BPD:      70.3  mm     G. Age:  28w 2d          7  %    CI:        78.37   %    70 - 86                                                          FL/HC:      20.1   %    19.2 - 21.4  HC:      251.2  mm     G. Age:  27w 2d        < 1  %    HC/AC:      1.06        0.99 - 1.21  AC:      238.1  mm     G. Age:  28w 1d          9  %    FL/BPD:     72.0   %    71 - 87  FL:       50.6  mm     G. Age:  27w 1d        1.1  %    FL/AC:      21.3   %    20 - 24  LV:        7.1  mm  Est. FW:    1115  gm      2 lb 7 oz    2.6  % ---------------------------------------------------------------------- OB History  Gravidity:    2         Term:   1  Living:       1 ---------------------------------------------------------------------- Gestational Age  LMP:           29w 4d        Date:  10/05/19                 EDD:   07/11/20  U/S Today:     27w 5d                                        EDD:   07/24/20  Best:          29w 4d     Det. By:  LMP  (10/05/19)          EDD:   07/11/20 ---------------------------------------------------------------------- Anatomy  Cranium:               Previously seen        Aortic Arch:            Previously seen  Cavum:                 Previously seen        Ductal Arch:            Not well visualized  Ventricles:  Appears normal         Diaphragm:              Appears normal  Choroid Plexus:        Previously seen        Stomach:                Appears normal, left                                                                        sided  Cerebellum:            Previously seen        Abdomen:                Previously seen  Posterior Fossa:       Previously seen        Abdominal Wall:         Previously seen  Nuchal Fold:            Previously seen        Cord Vessels:           Previously seen  Face:                  Orbits and profile     Kidneys:                Appear normal                         previously seen  Lips:                  Previously seen        Bladder:                Appears normal  Thoracic:              Previously seen        Spine:                  Previously seen  Heart:                 Previously seen        Upper Extremities:      RUE prev.vis; LUE                                                                        visualized  RVOT:                  Previously seen        Lower Extremities:      Previously seen  LVOT:                  Previously seen  Other:  Female gender previously visualized. Technically difficult due to          maternal habitus and fetal position. ---------------------------------------------------------------------- Doppler - Fetal Vessels  Umbilical Artery   S/D     %tile      RI    %tile                      PSV    ADFV    RDFV                                                     (cm/s)   6.62   > 97.5    0.85   > 97.5                     44.14      No      No ---------------------------------------------------------------------- Cervix Uterus Adnexa  Cervix  Not visualized (advanced GA >24wks)  Uterus  No abnormality visualized.  Right Ovary  No adnexal mass visualized.  Left Ovary  No adnexal mass visualized.  Cul De Sac  No free fluid seen.  Adnexa  No abnormality visualized. ---------------------------------------------------------------------- Impression  Pre-existing diabetes and patient takes insulin for control.  Fetal growth restriction.  On today's ultrasound, the estimated fetal weight is at the 3rd  percentile.  Interval weight gain is 368 g over 3 weeks  (suboptimal).  Amniotic fluid is normal good fetal activity  seen.  Umbilical artery Doppler showed increased S/D ratio.  NST is nonreactive for this gestational age but no  decelerations are seen.  ---------------------------------------------------------------------- Recommendations  UA Doppler and NST next week  - BPP, UA Doppler and NST in 2 weeks and then weekly till  delivery. ----------------------------------------------------------------------                  Tama High, MD Electronically Signed Final Report   04/29/2020 01:05 pm ----------------------------------------------------------------------  [place] MFM UA CORD DOPPLER  Result Date: 05/06/2020 ----------------------------------------------------------------------  OBSTETRICS REPORT                       (Signed Final 05/06/2020 12:23 pm) ---------------------------------------------------------------------- Patient Info  ID #:       [id number]                          D.O.B.:  [birth year] (34 yrs)  Name:       Lauren Delgado              Visit Date: 05/06/2020 09:09 am ---------------------------------------------------------------------- Performed By  Attending:        Tama High MD        Ref. Address:     [place], [place]  [place]  Performed By:     Jeanene Erb BS,      Location[facility]                    RDMS                                     [facility] at                                                             [facility]                                                             [facility]  Referred By:      Chancy Milroy                    MD ---------------------------------------------------------------------- Orders  #  Description                           Code        Ordered By  1  [place] MFM UA CORD DOPPLER                90240.97    Tama High ----------------------------------------------------------------------  #  Order #                     Accession #                Episode #  1  [id number]                    [id number]                 [id number] ---------------------------------------------------------------------- Indications  [redacted] weeks gestation of pregnancy                Z3A.55  Maternal care for known or suspected poor      O36.5931  fetal growth, third trimester, fetus 1 IUGR  Pre-existing diabetes, type 2, in pregnancy,   O24.19  unspecified trimester (insulin)  History of cesarean delivery, currently        O34.219  pregnant  Hypertension - Chronic/Pre-existing(On         O10.019  Meds)  Poor obstetric history: Previous gestational   O09.299  diabetes  Congestive Heart Failure  Small for gestational age fetus affecting      O71.5990  management of mother  Obesity complicating pregnancy, third          O68.213  trimester (Pregravid BMI 52) ---------------------------------------------------------------------- Vital Signs  Weight (lb): 288                               Height:        5'2"  BMI:         52.67 ----------------------------------------------------------------------  Fetal Evaluation  Num Of Fetuses:         1  Fetal Heart Rate(bpm):  143  Cardiac Activity:       Observed  Presentation:           Cephalic  Placenta:               Anterior  P. Cord Insertion:      Marginal insertion  Amniotic Fluid  AFI FV:      Within normal limits ---------------------------------------------------------------------- OB History  Gravidity:    2         Term:   1  Living:       1 ---------------------------------------------------------------------- Gestational Age  LMP:           30w 4d        Date:  10/05/19                 EDD:   07/11/20  Best:          30w 4d     Det. By:  LMP  (10/05/19)          EDD:   07/11/20 ---------------------------------------------------------------------- Doppler - Fetal Vessels  Umbilical Artery   S/D     %tile      RI    %tile      PI    %tile            ADFV    RDFV   7.46   > 97.5    0.87   > 97.5    1.93   > 97.5               No      No  ---------------------------------------------------------------------- Impression  Pregestational diabetes. Patient reports she takes Lantus  insulin 35 units at night and 25 units Humalog with each  meal. She is not checking her blood glucose regularly and  reports her fasting levels are in 120s.  Chronic hypertension: Patient is on several medications  including labetalol, hydralazine, Imdur and Lasix.  Chronic diastolic heart failure: Patient is being followed by her  cardiologist.  She does not have shortness of breath or chest  pain.  Severe fetal growth restriction: On ultrasound performed last  week, the estimated fetal weight was at the 3rd percentile  and umbilical artery Doppler flow showed increased S/D ratio.  Patient had missed her previous prenatal appointments and  her last visit was on July 14th.  On today's ultrasound, amniotic fluid is normal and good fetal  activity seen.  Umbilical artery Doppler showed increased S/D  ratio.  NST is reactive.  I explained the possibility of poor control of diabetes and risks  associated with poor control including stillbirth.  Patient has  also had missed her prenatal appointments.  I have  recommended inpatient management for good control of  diabetes.  Patient is willing to be admitted either tomorrow or  the day after..  I discussed with Dr. Elgie Congo [facility] attending), who will be making  arrangements for her admission. ---------------------------------------------------------------------- Recommendations  -Inpatient management for control of diabetes.  -Weekly antenatal testing to continue. ----------------------------------------------------------------------                  Tama High, MD Electronically Signed Final Report   05/06/2020 12:23 pm ----------------------------------------------------------------------  [place] MFM UA CORD DOPPLER  Result Date: 04/29/2020 ----------------------------------------------------------------------  OBSTETRICS REPORT                        (  Signed Final 04/29/2020 01:05 pm) ---------------------------------------------------------------------- Patient Info  ID #:       [id number]                          D.O.B.:  11/08/1985 (34 yrs)  Name:       ANNARAE MACNAIR Piccini              Visit Date: 04/29/2020 10:13 am ---------------------------------------------------------------------- Performed By  Attending:        Tama High MD        Ref. Address:     [place], [place]  Performed By:     Jacob Moores BS,       Location[facility]                    RDMS, RVT                                [facility] at                                                             [place] [facility]                                                             [facility]  Referred By:      Chancy Milroy                    MD ---------------------------------------------------------------------- Orders  #  Description                           Code        Ordered By  1  [place] MFM OB FOLLOW UP                   [id number]  Peterson Ao  2  [place] MFM UA CORD DOPPLER                [id number]    YU FANG ----------------------------------------------------------------------  #  Order #                     Accession #                Episode #  1  [id number]                   [id number]                 [id number]  2  [id number]                   [id number]                 [id number] ---------------------------------------------------------------------- Indications  [redacted] weeks gestation of pregnancy                Z3A.29  Maternal care for known or suspected poor      O36.5931  fetal growth, third trimester, fetus 1 IUGR  Pre-existing diabetes, type 2, in pregnancy,   O24.19  unspecified trimester (insulin)  History of cesarean delivery, currently        O34.219  pregnant   Obesity complicating pregnancy, unspecified    O99.210  Hypertension - Chronic/Pre-existing            O10.019  Poor obstetric history: Previous gestational   O09.299  diabetes  Congestive Heart Failure  Small for gestational age fetus affecting      O68.5990  management of mother ---------------------------------------------------------------------- Vital Signs                                                 Height:        5'2" ---------------------------------------------------------------------- Fetal Evaluation  Num Of Fetuses:         1  Fetal Heart Rate(bpm):  147  Cardiac Activity:       Observed  Presentation:           Cephalic  Placenta:               Anterior  P. Cord Insertion:      Marginal insertion previously  Amniotic Fluid  AFI FV:      Within normal limits  AFI Sum(cm)     %Tile       Largest Pocket(cm)  22.58           91          7.84  RUQ(cm)       RLQ(cm)       LUQ(cm)        LLQ(cm)  7.84          2.85          5.46           6.44 ---------------------------------------------------------------------- Biometry  BPD:      70.3  mm     G. Age:  28w 2d          7  %    CI:        78.37   %    70 - 86  FL/HC:      20.1   %    19.2 - 21.4  HC:      251.2  mm     G. Age:  27w 2d        < 1  %    HC/AC:      1.06        0.99 - 1.21  AC:      238.1  mm     G. Age:  28w 1d          9  %    FL/BPD:     72.0   %    71 - 87  FL:       50.6  mm     G. Age:  27w 1d        1.1  %    FL/AC:      21.3   %    20 - 24  LV:        7.1  mm  Est. FW:    1115  gm      2 lb 7 oz    2.6  % ---------------------------------------------------------------------- OB History  Gravidity:    2         Term:   1  Living:       1 ---------------------------------------------------------------------- Gestational Age  LMP:           29w 4d        Date:  10/05/19                 EDD:   07/11/20  U/S Today:     27w 5d                                        EDD:   07/24/20   Best:          29w 4d     Det. By:  LMP  (10/05/19)          EDD:   07/11/20 ---------------------------------------------------------------------- Anatomy  Cranium:               Previously seen        Aortic Arch:            Previously seen  Cavum:                 Previously seen        Ductal Arch:            Not well visualized  Ventricles:            Appears normal         Diaphragm:              Appears normal  Choroid Plexus:        Previously seen        Stomach:                Appears normal, left                                                                        sided  Cerebellum:            Previously seen        Abdomen:                Previously seen  Posterior Fossa:       Previously seen        Abdominal Wall:         Previously seen  Nuchal Fold:           Previously seen        Cord Vessels:           Previously seen  Face:                  Orbits and profile     Kidneys:                Appear normal                         previously seen  Lips:                  Previously seen        Bladder:                Appears normal  Thoracic:              Previously seen        Spine:                  Previously seen  Heart:                 Previously seen        Upper Extremities:      RUE prev.vis; LUE                                                                        visualized  RVOT:                  Previously seen        Lower Extremities:      Previously seen  LVOT:                  Previously seen  Other:  Female gender previously visualized. Technically difficult due to          maternal habitus and fetal position. ---------------------------------------------------------------------- Doppler - Fetal Vessels  Umbilical Artery   S/D     %tile      RI    %tile                      PSV    ADFV    RDFV                                                     (cm/s)   6.62   > 97.5    0.85   > 97.5  44.14      No      No  ---------------------------------------------------------------------- Cervix Uterus Adnexa  Cervix  Not visualized (advanced GA >24wks)  Uterus  No abnormality visualized.  Right Ovary  No adnexal mass visualized.  Left Ovary  No adnexal mass visualized.  Cul De Sac  No free fluid seen.  Adnexa  No abnormality visualized. ---------------------------------------------------------------------- Impression  Pre-existing diabetes and patient takes insulin for control.  Fetal growth restriction.  On today's ultrasound, the estimated fetal weight is at the 3rd  percentile.  Interval weight gain is 368 g over 3 weeks  (suboptimal).  Amniotic fluid is normal good fetal activity  seen.  Umbilical artery Doppler showed increased S/D ratio.  NST is nonreactive for this gestational age but no  decelerations are seen. ---------------------------------------------------------------------- Recommendations  UA Doppler and NST next week  - BPP, UA Doppler and NST in 2 weeks and then weekly till  delivery. ----------------------------------------------------------------------                  Tama High, MD Electronically Signed Final Report   04/29/2020 01:05 pm ----------------------------------------------------------------------  [place] MFM UA CORD DOPPLER  Result Date: 04/22/2020 ----------------------------------------------------------------------  OBSTETRICS REPORT                       (Signed Final 04/22/2020 12:00 pm) ---------------------------------------------------------------------- Patient Info  ID #:       [id number]                          D.O.B.:  Nov 30, 1985 (34 yrs)  Name:       Lauren Delgado              Visit Date: 04/22/2020 09:48 am ---------------------------------------------------------------------- Performed By  Attending:        Johnell Comings MD         Ref. Address:     [place], [place]  Performed By:     Jeanene Erb BS,      [facility]                                                             [place] [facility]                                                             [facility]  Referred By:      Chancy Milroy                    MD ---------------------------------------------------------------------- Orders  #  Description                           Code        Ordered By  1  [place] MFM UA CORD DOPPLER                [phone]    YU FANG ----------------------------------------------------------------------  #  Order #                     Accession #                Episode #  1  [id number]                   [id number]                 [id number] ---------------------------------------------------------------------- Indications  Maternal care for known or suspected poor      O36.5931  fetal growth, third trimester, fetus 1 IUGR  Pre-existing diabetes, type 2, in pregnancy,   O24.19  unspecified trimester (insulin)  History of cesarean delivery, currently        O65.219  pregnant  Obesity complicating pregnancy, unspecified    O99.210  Hypertension - Chronic/Pre-existing            O10.019  Poor obstetric history: Previous gestational   O09.299  diabetes  Congestive Heart Failure  Small for gestational age fetus affecting      O67.5990  management of mother  [redacted] weeks gestation of pregnancy                Z3A.28 ---------------------------------------------------------------------- Vital Signs                                                 Height:        5'2" ---------------------------------------------------------------------- Fetal Evaluation  Num Of Fetuses:         1  Fetal Heart Rate(bpm):  150  Cardiac Activity:       Observed  Presentation:           Transverse, head to maternal left  Placenta:                Anterior  P. Cord Insertion:      Marginal insertion  Amniotic Fluid  AFI FV:      Within normal limits  AFI  Sum(cm)     %Tile       Largest Pocket(cm)  19.49           76          5.91  RUQ(cm)       RLQ(cm)       LUQ(cm)        LLQ(cm)  5.91          3.32          5.9            4.36 ---------------------------------------------------------------------- OB History  Gravidity:    2         Term:   1  Living:       1 ---------------------------------------------------------------------- Gestational Age  LMP:           28w 4d        Date:  10/05/19                 EDD:   07/11/20  Best:          Timothy Lasso 4d     Det. By:  LMP  (10/05/19)          EDD:   07/11/20 ---------------------------------------------------------------------- Anatomy  Stomach:               Visualized             Bladder:                Visualized ---------------------------------------------------------------------- Doppler - Fetal Vessels  Umbilical Artery   S/D     %tile      RI    %tile      PI    %tile     PSV    ADFV    RDFV                                                     (cm/s)    4.4       96     0.5    < 2.5     0.9       31     33.1      No      No ---------------------------------------------------------------------- Comments  This patient was seen due to an IUGR fetus.  Her pregnancy  has also been complicated by pregestational diabetes,  chronic hypertension, and history of congestive heart failure  following a myocardial infarction.  She denies any problems  since her last exam.  There was normal amniotic fluid noted on today's ultrasound  exam.  Doppler studies of the umbilical arteries performed due to  fetal growth restriction continues to show normal forward flow  with an elevated S/D ratio of 4.4.  There were no signs of  absent or reversed end-diastolic flow noted today.  The patient had a reactive nonstress test following today's  ultrasound exam.  Due to fetal growth restriction, we will continue to follow her  with weekly  fetal testing and umbilical artery Doppler studies.  A follow-up exam was scheduled in 1 week. ----------------------------------------------------------------------                   Johnell Comings, MD Electronically Signed Final Report   04/22/2020 12:00 pm ----------------------------------------------------------------------   Future Appointments  Date Time Provider [place]  05/13/2020 10:30 AM  [facility] NURSE [facility]  05/13/2020 10:45 AM [facility] US5 [facility]  05/13/2020 11:15 AM [facility] NST [facility]  05/18/2020  9:30 AM [facility] NURSE [facility]  05/18/2020  9:45 AM [facility] US4 [facility]  05/18/2020 10:45 AM [facility] NST [facility]  06/03/2020 12:00 PM Bensimhon, Shaune Pascal, MD [facility] None  09/27/2020 10:00 AM [name], Nadean Corwin, MD [facility]    Discharge Condition: Stable  Discharge disposition: 01-Home or Self Care       Discharge Instructions    Discharge activity:  No Restrictions   Complete by: As directed    Discharge diet:   Complete by: As directed    Diabetic   Fetal Kick Count:  Lie on our left side for one hour after a meal, and count the number of times your baby kicks.  If it is less than 5 times, get up, move around and drink some juice.  Repeat the test 30 minutes later.  If it is still less than 5 kicks in an hour, notify your doctor.   Complete by: As directed    No sexual activity restrictions   Complete by: As directed    Notify physician for a general feeling that "something is not right"   Complete by: As directed    Notify physician for increase or change in vaginal discharge   Complete by: As directed    Notify physician for leaking of fluid   Complete by: As directed    Notify physician for pelvic pressure   Complete by: As directed    PRETERM LABOR:  Includes any of the follwing symptoms that occur between 20 - [redacted] weeks gestation.  If these symptoms are not stopped, preterm labor can result in preterm delivery, placing your  baby at risk   Complete by: As directed      Allergies as of 05/10/2020   No Known Allergies     Medication List    TAKE these medications   aspirin 81 MG chewable tablet Chew 1 tablet (81 mg total) by mouth daily.   cefadroxil 500 MG capsule Commonly known as: DURICEF Take 1 capsule (500 mg total) by mouth 2 (two) times daily.   cetirizine 10 MG tablet Commonly known as: ZyrTEC Allergy Take 1 tablet (10 mg total) by mouth daily. What changed:   when to take this  reasons to take this   furosemide 40 MG tablet Commonly known as: Lasix Take 1 tablet (40 mg total) by mouth 2 (two) times daily.   hydrALAZINE 25 MG tablet Commonly known as: APRESOLINE Take 2 tablets (50 mg total) by mouth 3 (three) times daily. What changed: when to take this   insulin aspart 100 UNIT/ML injection Commonly known as: NovoLOG Inject 10 Units into the skin 3 (three) times daily with meals. What changed:   how much to take  when to take this   insulin glargine 100 UNIT/ML injection Commonly known as: Lantus Inject 0.15 mLs (15 Units total) into the skin at bedtime. What changed: how much to take   isosorbide mononitrate 60 MG 24 hr tablet Commonly known as: IMDUR Take 1.5 tablets (90 mg total) by mouth daily.   labetalol 200 MG tablet Commonly known as: NORMODYNE TAKE 1 TABLET BY MOUTH THREE TIMES DAILY What changed: when to take this   NIFEdipine 60 MG 24 hr tablet Commonly known as: PROCARDIA XL/NIFEDICAL XL Take 1 tablet (60 mg total) by mouth at bedtime.   nitroGLYCERIN 0.4 MG SL tablet  Commonly known as: NITROSTAT Place 1 tablet (0.4 mg total) under the tongue every 5 (five) minutes as needed for chest pain.   ondansetron 4 MG tablet Commonly known as: ZOFRAN Take 1 tablet (4 mg total) by mouth every 8 (eight) hours as needed for nausea or vomiting. What changed: when to take this   pantoprazole 20 MG tablet Commonly known as: PROTONIX Take 20 mg by mouth 2 (two)  times daily.   potassium chloride 20 MEQ/15ML (10%) Soln Take 30 mLs (40 mEq total) by mouth daily.   prenatal multivitamin Tabs tablet Take 1 tablet by mouth daily at 12 noon.   valACYclovir 500 MG tablet Commonly known as: Valtrex Take 1 tablet (500 mg total) by mouth as needed.       Total discharge planning time: 25 minutes   Signed: Verita Schneiders M.D. 05/10/2020, 10:53 AM

## 2020-05-10 NOTE — Discharge Instructions (Signed)
Type 1 or Type 2 Diabetes Mellitus During Pregnancy, Self Care Caring for yourself during your pregnancy when you have type 1 diabetes (type 1 diabetes mellitus) or type 2 diabetes (type 2 diabetes mellitus) means keeping your blood sugar (glucose) under control with a balance of:  Nutrition.  Exercise.  Lifestyle changes.  Insulin or medicines, if necessary.  Support from your team of health care providers and others. The following information explains what you need to know to manage your diabetes at home during your pregnancy. What are the risks? If diabetes is treated, it is unlikely to cause problems. If it is not controlled with treatment, it may cause problems during labor and delivery, and some of those problems can be harmful to the unborn baby (fetus) and the mother. Uncontrolled diabetes may also cause the newborn baby to have breathing problems and low blood glucose. Having diabetes can put you at risk for other long-term (chronic) conditions, such as heart disease and kidney disease. Your health care provider may prescribe medicines to help prevent complications from diabetes. These medicines may include:  Aspirin.  Medicine to lower cholesterol.  Medicine to control blood pressure. How to monitor blood glucose   Check your blood glucose every day, as often as told by your health care provider.  Contact your health care provider if your blood glucose is above your target for 2 tests in a row.  Have your A1c (hemoglobin A1c) level checked at least two times a year, or as often as told by your health care provider. Your health care provider will set personal treatment goals for you. Generally, the goal of treatment is to maintain the following blood glucose levels during pregnancy:  After not eating (after fasting) for 8 hours: at or below 95 mg/dL (5.3 mmol/L).  After meals (postprandial): ? One hour after a meal: at or below 140 mg/dL (7.8 mmol/L). ? Two hours after a  meal: at or below 120 mg/dL (6.7 mmol/L).  A1c level: 6-6.5% How to manage hyperglycemia and hypoglycemia Hyperglycemia symptoms Hyperglycemia, also called high blood glucose, occurs when blood glucose is too high. Make sure you know the early signs of hyperglycemia, such as:  Increased thirst.  Hunger.  Feeling very tired.  Needing to urinate more often than usual.  Blurry vision. Hypoglycemia symptoms Hypoglycemia, also called low blood glucose, occurs with a blood glucose level at or below 70 mg/dL (3.9 mmol/L). The risk for hypoglycemia increases during or after exercise, during sleep, during illness, and when skipping meals or fasting. It is important to know the symptoms of hypoglycemia and treat it right away. Always have a 15-gram rapid-acting carbohydrate snack with you to treat low blood glucose. Family members and close friends should also know the symptoms and should understand how to treat hypoglycemia, in case you are not able to treat yourself. Symptoms may include:  Hunger.  Anxiety.  Sweating and feeling clammy.  Confusion.  Dizziness or feeling light-headed.  Sleepiness.  Nausea.  Increased heart rate.  Headache.  Blurry vision.  Irritability.  Tingling or numbness around the mouth, lips, or tongue.  A change in coordination.  Restless sleep.  Fainting.  Seizure. Treating hypoglycemia If you are alert and able to swallow safely, follow the 15:15 rule:  Take 15 grams of a rapid-acting carbohydrate. Rapid-acting options include: ? 1 tube of glucose gel. ? 3 glucose pills. ? 6-8 pieces of hard candy. ? 4 oz (120 mL) of fruit juice . ? 4 oz (120 mL)  of regular (not diet) soda.  Check your blood glucose 15 minutes after you take the carbohydrate.  If the repeat blood glucose level is still at or below 70 mg/dL (3.9 mmol/L), take 15 grams of a carbohydrate again.  If your blood glucose level does not increase above 70 mg/dL (3.9 mmol/L)  after 3 tries, seek emergency medical care.  After your blood glucose level returns to normal, eat a meal or a snack within 1 hour. Treating severe hypoglycemia Severe hypoglycemia is when your blood glucose level is at or below 54 mg/dL (3 mmol/L). Severe hypoglycemia is an emergency. Do not wait to see if the symptoms will go away. Get medical help right away. Call your local emergency services (911 in the [place] If you have severe hypoglycemia and you cannot eat or drink, you may need an injection of glucagon. A family member or close friend should learn how to check your blood glucose and how to give you a glucagon injection. Ask your health care provider if you need to have an emergency glucagon injection kit available. Severe hypoglycemia may need to be treated in a hospital. The treatment may include getting glucose through an IV. You may also need treatment for the cause of your hypoglycemia. Follow these instructions at home: Take diabetes medicines as told  If your health care provider prescribed insulin or diabetes medicines, take them every day.  Do not run out of insulin or other diabetes medicines that you take. Plan ahead so you always have these available.  If you use insulin, adjust your dosage based on how physically active you are and what foods you eat. Your health care provider will tell you how to adjust your dosage.  Your health care provider may recommend that you take one low-dose aspirin (81 mg) each day to help prevent high blood pressure during pregnancy (preeclampsia or eclampsia). You may be at risk for preeclampsia or eclampsia if: ? You had any of the following during a previous pregnancy:  Preeclampsia or eclampsia.  A fetal growth rate that was slower than normal.  An early (preterm) birth.  Separation of the placenta from the uterus (placental abruption).  Fetal loss. ? You are pregnant with more than one baby. ? You have other medical conditions, such  as high blood pressure or an autoimmune disease. Make healthy food choices  The things that you eat and drink affect your blood glucose and your insulin dosage. Making good choices helps to control your diabetes and prevent other health problems. A healthy meal plan includes eating lean proteins, complex carbohydrates, fresh fruits and vegetables, low-fat dairy products, and healthy fats. Make an appointment to see a diet and nutrition specialist (registered dietitian) to help you create an eating plan that is right for you. Make sure that you:  Follow instructions from your health care provider about eating or drinking restrictions.  Drink enough fluid to keep your urine pale yellow.  Eat healthy snacks between nutritious meals.  Keep a record of the carbohydrates that you eat. Do this by reading food labels and learning the standard serving sizes of foods.  Follow your sick day plan whenever you cannot eat or drink as usual. Make this plan in advance with your health care provider.  Stay active  Do at least 30 minutes of physical activity a day, or as much physical activity as your health care provider recommends during your pregnancy.  If you start a new exercise or activity, work with your health  care provider to adjust your insulin, medicines, or food intake as needed. Make healthy lifestyle choices  Do not drink alcohol.  Do not use any tobacco products, such as cigarettes, chewing tobacco, and e-cigarettes. If you need help quitting, ask your health care provider.  Learn to manage stress. If you need help with this, ask your health care provider. Care for your body  Keep your immunizations up to date.  Schedule an eye exam during your first trimester of your pregnancy, or as told by your health care provider.  Check your skin and feet every day for cuts, bruises, redness, blisters, or sores. Schedule a foot exam with your health care provider once every year.  Brush your  teeth and gums two times a day, and floss one or more times a day. Visit your dentist one or more times every 6 months.  Maintain a healthy weight during your pregnancy. General instructions  Take over-the-counter and prescription medicines only as told by your health care provider.  Talk with your health care provider about your risk for high blood pressure during pregnancy (preeclampsia or eclampsia).  Share your diabetes management plan with people in your workplace, school, and household.  Check your urine ketones when you are ill and as told by your health care provider.  Carry a medical alert card or wear medical alert jewelry.  Keep all follow-up visits during your pregnancy (prenatal) and after delivery (postnatal) as told by your health care provider. This is important. Questions to ask your health care provider  Do I need to meet with a diabetes educator?  Where can I find a support group for people with diabetes? Where to find more information For more information about diabetes, visit:  American Diabetes Association (ADA): www.diabetes.org  American Association of Diabetes Educators (AADE): www.diabeteseducator.org Summary  Caring for yourself when you have type 1 or type 2 diabetes means keeping your blood sugar (glucose) under control. You can do that with a balance of insulin and other medicines, nutrition, exercise, and lifestyle changes.  Check your blood glucose every day, as often as told by your health care provider.  If your health care provider prescribed insulin or diabetes medicines, take them every day.  Keep all follow-up visits during your pregnancy (prenatal) and after delivery (postnatal) as told by your health care provider. This is important. This information is not intended to replace advice given to you by your health care provider. Make sure you discuss any questions you have with your health care provider. Document Revised: 10/18/2017 Document  Reviewed: 09/23/2015 Elsevier Patient Education  2020 Elsevier Inc.  

## 2020-05-10 NOTE — Progress Notes (Signed)
  Nutrition Consult for diet education  Nutrition Dx: Food and nutrition-related knowledge deficit r/t limited comprehension of previous education ( 12/11/19) aeb pt report.  Pt has not been counting CHO's at home prior to admission. Does eliminate sweets.  Nutrition education consult for Carbohydrate Modified Gestational Diabetic Diet completed.  "Meal  plan for gestational diabetics" handout given to patient.  Basic concepts reviewed. Typical meals planned out. Stressed elimination of sugary beverages, counting CHO's. Pt usually consumes 2 meals per day plus occasional snacks. First meal is at lunch time. ( takes son to school then comes home and sleeps. Takes insulin for first time at noon.)  Questions answered.  Patient verbalizes understanding.  Weyman Rodney M.Fredderick Severance LDN Neonatal Nutrition Support Specialist/RD III

## 2020-05-10 NOTE — Telephone Encounter (Signed)
Tried calling patient to schedule OB appointment with Dr Kennon Rounds per Dr Arther Abbott request. Phone number is invalid or not working. Sent mychart message to call [facility] to schedule appointment.

## 2020-05-10 NOTE — Progress Notes (Signed)
Pt discharged after discharge instructions given. All questions answered. IV was discontinued. Pt discharged with all belongings in stable condition.

## 2020-05-11 ENCOUNTER — Telehealth: Payer: Self-pay | Admitting: *Deleted

## 2020-05-11 NOTE — Telephone Encounter (Signed)
Transition Care Management Unsuccessful Follow-up Telephone Call  Date of discharge and from where:  05/10/20, [facility]  Attempts:  1st Attempt  Reason for unsuccessful TCM follow-up call:  Left voice message.  Lenor Coffin, RN, BSN, [place] Patient [place] [phone]

## 2020-05-12 ENCOUNTER — Telehealth: Payer: Self-pay | Admitting: *Deleted

## 2020-05-12 NOTE — Telephone Encounter (Signed)
Transition Care Management Unsuccessful Follow-up Telephone Call  Date of discharge and from where:  05/10/20, [facility]  Attempts:  2nd Attempt  Reason for unsuccessful TCM follow-up call:  Left voice message.  Lenor Coffin, RN, BSN, [place] Patient [place] [phone]

## 2020-05-13 ENCOUNTER — Ambulatory Visit: Payer: Medicaid Other

## 2020-05-13 ENCOUNTER — Telehealth: Payer: Self-pay | Admitting: *Deleted

## 2020-05-13 NOTE — Telephone Encounter (Signed)
Transition Care Management Unsuccessful Follow-up Telephone Call  Date of discharge and from where: 05/10/20, [facility]  Attempts:  3rd Attempt  Reason for unsuccessful TCM follow-up call:  Left voice message.  Lenor Coffin, RN, BSN, [place] Patient [place] [phone]

## 2020-05-14 ENCOUNTER — Other Ambulatory Visit: Payer: Self-pay

## 2020-05-14 ENCOUNTER — Other Ambulatory Visit (HOSPITAL_COMMUNITY): Payer: Self-pay

## 2020-05-16 MED ORDER — HYDRALAZINE HCL 25 MG PO TABS
50.0000 mg | ORAL_TABLET | Freq: Three times a day (TID) | ORAL | 11 refills | Status: DC
Start: 2020-05-16 — End: 2020-06-01

## 2020-05-17 MED ORDER — LABETALOL HCL 200 MG PO TABS
200.0000 mg | ORAL_TABLET | Freq: Three times a day (TID) | ORAL | 1 refills | Status: DC
Start: 2020-05-17 — End: 2020-06-01

## 2020-05-18 ENCOUNTER — Other Ambulatory Visit: Payer: Self-pay

## 2020-05-18 ENCOUNTER — Other Ambulatory Visit: Payer: Self-pay | Admitting: *Deleted

## 2020-05-18 ENCOUNTER — Ambulatory Visit: Payer: Medicaid Other | Admitting: *Deleted

## 2020-05-18 ENCOUNTER — Ambulatory Visit: Payer: Medicaid Other | Attending: Obstetrics and Gynecology

## 2020-05-18 DIAGNOSIS — O365931 Maternal care for other known or suspected poor fetal growth, third trimester, fetus 1: Secondary | ICD-10-CM | POA: Diagnosis not present

## 2020-05-18 DIAGNOSIS — O24013 Pre-existing diabetes mellitus, type 1, in pregnancy, third trimester: Secondary | ICD-10-CM

## 2020-05-18 DIAGNOSIS — O0990 Supervision of high risk pregnancy, unspecified, unspecified trimester: Secondary | ICD-10-CM | POA: Insufficient documentation

## 2020-05-18 DIAGNOSIS — O1210 Gestational proteinuria, unspecified trimester: Secondary | ICD-10-CM | POA: Diagnosis present

## 2020-05-18 DIAGNOSIS — O24819 Other pre-existing diabetes mellitus in pregnancy, unspecified trimester: Secondary | ICD-10-CM

## 2020-05-18 DIAGNOSIS — Z362 Encounter for other antenatal screening follow-up: Secondary | ICD-10-CM

## 2020-05-18 DIAGNOSIS — O09293 Supervision of pregnancy with other poor reproductive or obstetric history, third trimester: Secondary | ICD-10-CM

## 2020-05-18 DIAGNOSIS — O24113 Pre-existing diabetes mellitus, type 2, in pregnancy, third trimester: Secondary | ICD-10-CM | POA: Diagnosis not present

## 2020-05-18 DIAGNOSIS — E669 Obesity, unspecified: Secondary | ICD-10-CM | POA: Diagnosis not present

## 2020-05-18 DIAGNOSIS — Z3A32 32 weeks gestation of pregnancy: Secondary | ICD-10-CM

## 2020-05-18 DIAGNOSIS — O99213 Obesity complicating pregnancy, third trimester: Secondary | ICD-10-CM

## 2020-05-18 DIAGNOSIS — Z6841 Body Mass Index (BMI) 40.0 and over, adult: Secondary | ICD-10-CM | POA: Diagnosis not present

## 2020-05-18 DIAGNOSIS — O10019 Pre-existing essential hypertension complicating pregnancy, unspecified trimester: Secondary | ICD-10-CM | POA: Diagnosis present

## 2020-05-18 DIAGNOSIS — O365930 Maternal care for other known or suspected poor fetal growth, third trimester, not applicable or unspecified: Secondary | ICD-10-CM

## 2020-05-18 DIAGNOSIS — O365990 Maternal care for other known or suspected poor fetal growth, unspecified trimester, not applicable or unspecified: Secondary | ICD-10-CM | POA: Diagnosis present

## 2020-05-18 DIAGNOSIS — O24119 Pre-existing diabetes mellitus, type 2, in pregnancy, unspecified trimester: Secondary | ICD-10-CM | POA: Insufficient documentation

## 2020-05-18 DIAGNOSIS — O34219 Maternal care for unspecified type scar from previous cesarean delivery: Secondary | ICD-10-CM

## 2020-05-18 DIAGNOSIS — O10013 Pre-existing essential hypertension complicating pregnancy, third trimester: Secondary | ICD-10-CM | POA: Diagnosis not present

## 2020-05-18 DIAGNOSIS — O10919 Unspecified pre-existing hypertension complicating pregnancy, unspecified trimester: Secondary | ICD-10-CM

## 2020-05-18 NOTE — Procedures (Signed)
Lauren Delgado 05/05/1986 [redacted]w[redacted]d  Fetus A Non-Stress Test Interpretation for 05/18/20  Indication: Diabetes  Fetal Heart Rate A Mode: External Baseline Rate (A): 140 bpm Variability: Moderate Accelerations: 10 x 10 Decelerations: None Multiple birth?: No  Uterine Activity Mode: Palpation, Toco Contraction Frequency (min): none noted Resting Tone Palpated: Relaxed Resting Time: Adequate  Interpretation (Fetal Testing) Nonstress Test Interpretation: Non-reactive Overall Impression: Reassuring for gestational age Comments: Reviewed tracing with Dr. Filbert Berthold.  Had BPP today.

## 2020-05-20 ENCOUNTER — Other Ambulatory Visit: Payer: Self-pay | Admitting: Obstetrics and Gynecology

## 2020-05-20 DIAGNOSIS — O365990 Maternal care for other known or suspected poor fetal growth, unspecified trimester, not applicable or unspecified: Secondary | ICD-10-CM

## 2020-05-26 ENCOUNTER — Ambulatory Visit: Payer: Medicaid Other

## 2020-05-27 ENCOUNTER — Ambulatory Visit (HOSPITAL_BASED_OUTPATIENT_CLINIC_OR_DEPARTMENT_OTHER): Payer: Medicaid Other

## 2020-05-27 ENCOUNTER — Encounter (HOSPITAL_COMMUNITY): Payer: Self-pay | Admitting: Obstetrics and Gynecology

## 2020-05-27 ENCOUNTER — Inpatient Hospital Stay (HOSPITAL_COMMUNITY)
Admission: AD | Admit: 2020-05-27 | Payer: Medicaid Other | Source: Home / Self Care | Admitting: Obstetrics & Gynecology

## 2020-05-27 ENCOUNTER — Ambulatory Visit: Payer: Medicaid Other

## 2020-05-27 ENCOUNTER — Inpatient Hospital Stay (HOSPITAL_COMMUNITY)
Admission: AD | Admit: 2020-05-27 | Discharge: 2020-06-01 | DRG: 783 | Disposition: A | Discharge status: Home / Self Care | Payer: Medicaid Other | Attending: Obstetrics and Gynecology | Admitting: Obstetrics and Gynecology

## 2020-05-27 ENCOUNTER — Other Ambulatory Visit: Payer: Self-pay

## 2020-05-27 DIAGNOSIS — O365930 Maternal care for other known or suspected poor fetal growth, third trimester, not applicable or unspecified: Secondary | ICD-10-CM

## 2020-05-27 DIAGNOSIS — O34219 Maternal care for unspecified type scar from previous cesarean delivery: Secondary | ICD-10-CM

## 2020-05-27 DIAGNOSIS — O09293 Supervision of pregnancy with other poor reproductive or obstetric history, third trimester: Secondary | ICD-10-CM

## 2020-05-27 DIAGNOSIS — B009 Herpesviral infection, unspecified: Secondary | ICD-10-CM | POA: Diagnosis not present

## 2020-05-27 DIAGNOSIS — O99413 Diseases of the circulatory system complicating pregnancy, third trimester: Secondary | ICD-10-CM

## 2020-05-27 DIAGNOSIS — O2412 Pre-existing diabetes mellitus, type 2, in childbirth: Secondary | ICD-10-CM | POA: Diagnosis present

## 2020-05-27 DIAGNOSIS — Z302 Encounter for sterilization: Secondary | ICD-10-CM | POA: Diagnosis not present

## 2020-05-27 DIAGNOSIS — E785 Hyperlipidemia, unspecified: Secondary | ICD-10-CM | POA: Diagnosis present

## 2020-05-27 DIAGNOSIS — E1165 Type 2 diabetes mellitus with hyperglycemia: Secondary | ICD-10-CM | POA: Diagnosis present

## 2020-05-27 DIAGNOSIS — I509 Heart failure, unspecified: Secondary | ICD-10-CM

## 2020-05-27 DIAGNOSIS — E119 Type 2 diabetes mellitus without complications: Secondary | ICD-10-CM

## 2020-05-27 DIAGNOSIS — O1012 Pre-existing hypertensive heart disease complicating childbirth: Secondary | ICD-10-CM | POA: Diagnosis present

## 2020-05-27 DIAGNOSIS — Z955 Presence of coronary angioplasty implant and graft: Secondary | ICD-10-CM | POA: Diagnosis not present

## 2020-05-27 DIAGNOSIS — R9389 Abnormal findings on diagnostic imaging of other specified body structures: Secondary | ICD-10-CM | POA: Diagnosis not present

## 2020-05-27 DIAGNOSIS — I214 Non-ST elevation (NSTEMI) myocardial infarction: Secondary | ICD-10-CM | POA: Diagnosis not present

## 2020-05-27 DIAGNOSIS — I5032 Chronic diastolic (congestive) heart failure: Secondary | ICD-10-CM | POA: Diagnosis not present

## 2020-05-27 DIAGNOSIS — Z20822 Contact with and (suspected) exposure to covid-19: Secondary | ICD-10-CM | POA: Diagnosis present

## 2020-05-27 DIAGNOSIS — O24119 Pre-existing diabetes mellitus, type 2, in pregnancy, unspecified trimester: Secondary | ICD-10-CM

## 2020-05-27 DIAGNOSIS — O10913 Unspecified pre-existing hypertension complicating pregnancy, third trimester: Secondary | ICD-10-CM

## 2020-05-27 DIAGNOSIS — O9832 Other infections with a predominantly sexual mode of transmission complicating childbirth: Secondary | ICD-10-CM | POA: Diagnosis present

## 2020-05-27 DIAGNOSIS — I2510 Atherosclerotic heart disease of native coronary artery without angina pectoris: Secondary | ICD-10-CM | POA: Diagnosis not present

## 2020-05-27 DIAGNOSIS — O34211 Maternal care for low transverse scar from previous cesarean delivery: Secondary | ICD-10-CM | POA: Diagnosis not present

## 2020-05-27 DIAGNOSIS — I5022 Chronic systolic (congestive) heart failure: Secondary | ICD-10-CM | POA: Diagnosis not present

## 2020-05-27 DIAGNOSIS — O10919 Unspecified pre-existing hypertension complicating pregnancy, unspecified trimester: Secondary | ICD-10-CM | POA: Insufficient documentation

## 2020-05-27 DIAGNOSIS — O98513 Other viral diseases complicating pregnancy, third trimester: Secondary | ICD-10-CM | POA: Diagnosis not present

## 2020-05-27 DIAGNOSIS — I110 Hypertensive heart disease with heart failure: Secondary | ICD-10-CM | POA: Diagnosis present

## 2020-05-27 DIAGNOSIS — O365990 Maternal care for other known or suspected poor fetal growth, unspecified trimester, not applicable or unspecified: Secondary | ICD-10-CM | POA: Diagnosis present

## 2020-05-27 DIAGNOSIS — I5033 Acute on chronic diastolic (congestive) heart failure: Secondary | ICD-10-CM | POA: Diagnosis not present

## 2020-05-27 DIAGNOSIS — O0990 Supervision of high risk pregnancy, unspecified, unspecified trimester: Secondary | ICD-10-CM

## 2020-05-27 DIAGNOSIS — I43 Cardiomyopathy in diseases classified elsewhere: Secondary | ICD-10-CM | POA: Diagnosis not present

## 2020-05-27 DIAGNOSIS — O9942 Diseases of the circulatory system complicating childbirth: Secondary | ICD-10-CM | POA: Diagnosis not present

## 2020-05-27 DIAGNOSIS — Z794 Long term (current) use of insulin: Secondary | ICD-10-CM | POA: Diagnosis not present

## 2020-05-27 DIAGNOSIS — O1210 Gestational proteinuria, unspecified trimester: Secondary | ICD-10-CM

## 2020-05-27 DIAGNOSIS — O99892 Other specified diseases and conditions complicating childbirth: Secondary | ICD-10-CM | POA: Diagnosis present

## 2020-05-27 DIAGNOSIS — Z8619 Personal history of other infectious and parasitic diseases: Secondary | ICD-10-CM | POA: Diagnosis present

## 2020-05-27 DIAGNOSIS — N179 Acute kidney failure, unspecified: Secondary | ICD-10-CM | POA: Clinically undetermined

## 2020-05-27 DIAGNOSIS — O283 Abnormal ultrasonic finding on antenatal screening of mother: Secondary | ICD-10-CM

## 2020-05-27 DIAGNOSIS — O24113 Pre-existing diabetes mellitus, type 2, in pregnancy, third trimester: Secondary | ICD-10-CM

## 2020-05-27 DIAGNOSIS — O169 Unspecified maternal hypertension, unspecified trimester: Secondary | ICD-10-CM | POA: Diagnosis present

## 2020-05-27 DIAGNOSIS — R944 Abnormal results of kidney function studies: Secondary | ICD-10-CM

## 2020-05-27 DIAGNOSIS — Z3A33 33 weeks gestation of pregnancy: Secondary | ICD-10-CM

## 2020-05-27 DIAGNOSIS — Z362 Encounter for other antenatal screening follow-up: Secondary | ICD-10-CM | POA: Diagnosis not present

## 2020-05-27 DIAGNOSIS — O99891 Other specified diseases and conditions complicating pregnancy: Secondary | ICD-10-CM | POA: Diagnosis not present

## 2020-05-27 DIAGNOSIS — O99213 Obesity complicating pregnancy, third trimester: Secondary | ICD-10-CM | POA: Diagnosis not present

## 2020-05-27 DIAGNOSIS — Z98891 History of uterine scar from previous surgery: Secondary | ICD-10-CM

## 2020-05-27 DIAGNOSIS — R7309 Other abnormal glucose: Secondary | ICD-10-CM | POA: Diagnosis present

## 2020-05-27 DIAGNOSIS — O99214 Obesity complicating childbirth: Secondary | ICD-10-CM | POA: Diagnosis present

## 2020-05-27 DIAGNOSIS — I429 Cardiomyopathy, unspecified: Secondary | ICD-10-CM

## 2020-05-27 DIAGNOSIS — Z3A34 34 weeks gestation of pregnancy: Secondary | ICD-10-CM | POA: Diagnosis not present

## 2020-05-27 DIAGNOSIS — A6000 Herpesviral infection of urogenital system, unspecified: Secondary | ICD-10-CM | POA: Diagnosis present

## 2020-05-27 DIAGNOSIS — E1169 Type 2 diabetes mellitus with other specified complication: Secondary | ICD-10-CM | POA: Diagnosis present

## 2020-05-27 DIAGNOSIS — O10019 Pre-existing essential hypertension complicating pregnancy, unspecified trimester: Secondary | ICD-10-CM

## 2020-05-27 LAB — CBC
HCT: 30.3 % — ABNORMAL LOW (ref 36.0–46.0)
Hemoglobin: 10.1 g/dL — ABNORMAL LOW (ref 12.0–15.0)
MCH: 29.7 pg (ref 26.0–34.0)
MCHC: 33.3 g/dL (ref 30.0–36.0)
MCV: 89.1 fL (ref 80.0–100.0)
Platelets: 337 10*3/uL (ref 150–400)
RBC: 3.4 MIL/uL — ABNORMAL LOW (ref 3.87–5.11)
RDW: 13.2 % (ref 11.5–15.5)
WBC: 10.3 10*3/uL (ref 4.0–10.5)
nRBC: 0 % (ref 0.0–0.2)

## 2020-05-27 LAB — COMPREHENSIVE METABOLIC PANEL
ALT: 16 U/L (ref 0–44)
AST: 21 U/L (ref 15–41)
Albumin: 2.2 g/dL — ABNORMAL LOW (ref 3.5–5.0)
Alkaline Phosphatase: 81 U/L (ref 38–126)
Anion gap: 10 (ref 5–15)
BUN: 18 mg/dL (ref 6–20)
CO2: 21 mmol/L — ABNORMAL LOW (ref 22–32)
Calcium: 8.8 mg/dL — ABNORMAL LOW (ref 8.9–10.3)
Chloride: 104 mmol/L (ref 98–111)
Creatinine, Ser: 1.08 mg/dL — ABNORMAL HIGH (ref 0.44–1.00)
GFR calc Af Amer: >60 mL/min (ref >60)
GFR calc non Af Amer: >60 mL/min (ref >60)
Glucose, Bld: 192 mg/dL — ABNORMAL HIGH (ref 70–99)
Potassium: 4 mmol/L (ref 3.5–5.1)
Sodium: 135 mmol/L (ref 135–145)
Total Bilirubin: 0.5 mg/dL (ref 0.3–1.2)
Total Protein: 6.5 g/dL (ref 6.5–8.1)

## 2020-05-27 LAB — GLUCOSE, CAPILLARY
Glucose-Capillary: 167 mg/dL — ABNORMAL HIGH (ref 70–99)
Glucose-Capillary: 165 mg/dL — ABNORMAL HIGH (ref 70–99)
Glucose-Capillary: 237 mg/dL — ABNORMAL HIGH (ref 70–99)

## 2020-05-27 LAB — RESPIRATORY PANEL BY RT PCR (FLU A&B, COVID)
Influenza A by PCR: NEGATIVE
Influenza B by PCR: NEGATIVE
SARS Coronavirus 2 by RT PCR: NEGATIVE

## 2020-05-27 LAB — TYPE AND SCREEN
ABO/RH(D): B POS
Antibody Screen: NEGATIVE

## 2020-05-27 MED ORDER — SODIUM CHLORIDE 0.9% FLUSH
3.0000 mL | Freq: Two times a day (BID) | INTRAVENOUS | Status: DC
  Administered 2020-05-27 – 2020-05-30 (×7): 3 mL via INTRAVENOUS

## 2020-05-27 MED ORDER — DOCUSATE SODIUM 100 MG PO CAPS
100.0000 mg | ORAL_CAPSULE | Freq: Every day | ORAL | Status: DC
  Filled 2020-05-27 (×2): qty 1

## 2020-05-27 MED ORDER — PRENATAL MULTIVITAMIN CH: 1.0000 | ORAL_TABLET | Freq: Every day | ORAL | Status: DC

## 2020-05-27 MED ORDER — CALCIUM CARBONATE ANTACID 500 MG PO CHEW: 2.0000 | CHEWABLE_TABLET | ORAL | Status: DC | PRN

## 2020-05-27 MED ORDER — HYDRALAZINE HCL 50 MG PO TABS
50.0000 mg | ORAL_TABLET | Freq: Three times a day (TID) | ORAL | Status: DC
  Administered 2020-05-27 – 2020-05-31 (×10): 50 mg via ORAL
  Filled 2020-05-27 (×11): qty 1

## 2020-05-27 MED ORDER — LABETALOL HCL 200 MG PO TABS
200.0000 mg | ORAL_TABLET | Freq: Three times a day (TID) | ORAL | Status: DC
  Administered 2020-05-27 – 2020-06-01 (×15): 200 mg via ORAL
  Filled 2020-05-27 (×15): qty 1

## 2020-05-27 MED ORDER — NIFEDIPINE ER OSMOTIC RELEASE 30 MG PO TB24
60.0000 mg | ORAL_TABLET | Freq: Every day | ORAL | Status: DC
  Administered 2020-05-27 – 2020-05-31 (×4): 60 mg via ORAL
  Filled 2020-05-27 (×4): qty 2

## 2020-05-27 MED ORDER — INSULIN ASPART 100 UNIT/ML ~~LOC~~ SOLN
10.0000 [IU] | Freq: Three times a day (TID) | SUBCUTANEOUS | Status: DC
  Administered 2020-05-27 – 2020-05-29 (×6): 10 [IU] via SUBCUTANEOUS

## 2020-05-27 MED ORDER — ACETAMINOPHEN 325 MG PO TABS: 650.0000 mg | ORAL_TABLET | ORAL | Status: DC | PRN

## 2020-05-27 MED ORDER — INSULIN ASPART 100 UNIT/ML ~~LOC~~ SOLN
0.0000 [IU] | Freq: Three times a day (TID) | SUBCUTANEOUS | Status: DC
  Administered 2020-05-27: 6 [IU] via SUBCUTANEOUS
  Administered 2020-05-27: 4 [IU] via SUBCUTANEOUS
  Administered 2020-05-28: 10 [IU] via SUBCUTANEOUS
  Administered 2020-05-28: 4 [IU] via SUBCUTANEOUS
  Administered 2020-05-28: 8 [IU] via SUBCUTANEOUS
  Administered 2020-05-29: 6 [IU] via SUBCUTANEOUS
  Administered 2020-05-29: 3 [IU] via SUBCUTANEOUS
  Administered 2020-05-29: 6 [IU] via SUBCUTANEOUS

## 2020-05-27 MED ORDER — SODIUM CHLORIDE 0.9% FLUSH: 3.0000 mL | INTRAVENOUS | Status: DC | PRN

## 2020-05-27 MED ORDER — FUROSEMIDE 40 MG PO TABS
40.0000 mg | ORAL_TABLET | Freq: Two times a day (BID) | ORAL | Status: DC
  Administered 2020-05-27 – 2020-06-01 (×10): 40 mg via ORAL
  Filled 2020-05-27 (×10): qty 1

## 2020-05-27 MED ORDER — BETAMETHASONE SOD PHOS & ACET 6 (3-3) MG/ML IJ SUSP
12.0000 mg | INTRAMUSCULAR | Status: AC
  Administered 2020-05-27 – 2020-05-28 (×2): 12 mg via INTRAMUSCULAR
  Filled 2020-05-27: qty 5

## 2020-05-27 MED ORDER — PRENATAL MULTIVITAMIN CH
1.0000 | ORAL_TABLET | Freq: Every day | ORAL | Status: DC
  Administered 2020-05-27 – 2020-05-29 (×3): 1 via ORAL
  Filled 2020-05-27 (×3): qty 1

## 2020-05-27 MED ORDER — VALACYCLOVIR HCL 500 MG PO TABS
500.0000 mg | ORAL_TABLET | Freq: Two times a day (BID) | ORAL | Status: DC
  Administered 2020-05-27 – 2020-05-29 (×5): 500 mg via ORAL
  Filled 2020-05-27 (×5): qty 1

## 2020-05-27 MED ORDER — SODIUM CHLORIDE 0.9 % IV SOLN: 250.0000 mL | INTRAVENOUS | Status: DC | PRN

## 2020-05-27 MED ORDER — ASPIRIN 81 MG PO CHEW
81.0000 mg | CHEWABLE_TABLET | Freq: Every day | ORAL | Status: DC
  Filled 2020-05-27: qty 1

## 2020-05-27 MED ORDER — ASPIRIN 81 MG PO CHEW
81.0000 mg | CHEWABLE_TABLET | Freq: Every day | ORAL | Status: DC
  Administered 2020-05-27 – 2020-05-31 (×5): 81 mg via ORAL
  Filled 2020-05-27 (×5): qty 1

## 2020-05-27 MED ORDER — INSULIN GLARGINE 100 UNIT/ML ~~LOC~~ SOLN
30.0000 [IU] | Freq: Every day | SUBCUTANEOUS | Status: DC
  Administered 2020-05-27 – 2020-05-28 (×2): 30 [IU] via SUBCUTANEOUS
  Filled 2020-05-27 (×4): qty 0.3

## 2020-05-27 NOTE — Plan of Care (Signed)
  Problem: Education: Goal: Knowledge of General Education information will improve Description: Including pain rating scale, medication(s)/side effects and non-pharmacologic comfort measures Outcome: Completed/Met
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
## 2020-05-28 ENCOUNTER — Encounter (HOSPITAL_COMMUNITY): Payer: Self-pay | Admitting: Obstetrics and Gynecology

## 2020-05-28 LAB — GLUCOSE, CAPILLARY
Glucose-Capillary: 208 mg/dL — ABNORMAL HIGH (ref 70–99)
Glucose-Capillary: 194 mg/dL — ABNORMAL HIGH (ref 70–99)
Glucose-Capillary: 265 mg/dL — ABNORMAL HIGH (ref 70–99)
Glucose-Capillary: 292 mg/dL — ABNORMAL HIGH (ref 70–99)
Glucose-Capillary: 316 mg/dL — ABNORMAL HIGH (ref 70–99)

## 2020-05-28 LAB — BASIC METABOLIC PANEL
Anion gap: 9 (ref 5–15)
BUN: 21 mg/dL — ABNORMAL HIGH (ref 6–20)
CO2: 20 mmol/L — ABNORMAL LOW (ref 22–32)
Calcium: 8.6 mg/dL — ABNORMAL LOW (ref 8.9–10.3)
Chloride: 104 mmol/L (ref 98–111)
Creatinine, Ser: 0.98 mg/dL (ref 0.44–1.00)
GFR calc Af Amer: >60 mL/min (ref >60)
GFR calc non Af Amer: >60 mL/min (ref >60)
Glucose, Bld: 235 mg/dL — ABNORMAL HIGH (ref 70–99)
Potassium: 3.7 mmol/L (ref 3.5–5.1)
Sodium: 133 mmol/L — ABNORMAL LOW (ref 135–145)

## 2020-05-28 NOTE — Plan of Care (Signed)
  Problem: Education: Goal: Knowledge of disease or condition will improve Outcome: Progressing   

## 2020-05-28 NOTE — Progress Notes (Signed)
Inpatient Diabetes Program Recommendations  AACE/ADA: New Consensus Statement on Inpatient Glycemic Control (2015)  Target Ranges:  Prepandial:   less than 140 mg/dL      Peak postprandial:   less than 180 mg/dL (1-2 hours)      Critically ill patients:  140 - 180 mg/dL   Lab Results  Component Value Date   GLUCAP 194 (H) 05/28/2020   HGBA1C 7.7 (H) 05/07/2020    Review of Glycemic Control  Diabetes history: DM2 Outpatient Diabetes medications: Lantus 30 unis QHS, Novolog 20 units tid after meals Current orders for Inpatient glycemic control: Lantus 30 units QHS, Novolog 0-16 units tid after meals and 10 units tid with meals  Inpatient Diabetes Program Recommendations:     Increase Lantus to 32 units QHS Increase Novolog to 12 units tid with meals  Will follow closely.  Thank you. Lorenda Peck, RD, LDN, CDE Inpatient Diabetes Coordinator [phone]

## 2020-05-28 NOTE — Progress Notes (Signed)
Patient ID: Lauren Delgado, female   DOB: 1986-01-31, 34 y.o.   MRN: [id number] [place] [facility]) NOTE  Lauren Delgado is a 34 y.o. G2P1001 at [redacted]w[redacted]d by early ultrasound who is admitted for IUGR, abnormal dopplers, T2DM.   Fetal presentation is cephalic. Length of Stay:  1  Days  Subjective: No complaints Patient reports the fetal movement as active. Patient reports uterine contraction  activity as none. Patient reports  vaginal bleeding as none. Patient describes fluid per vagina as None.  Vitals:  Blood pressure (!) 130/59, pulse 92, temperature 98.1 F (36.7 C), temperature source Oral, resp. rate 16, height 5\' 2"  (1.575 m), weight 128.8 kg, last menstrual period 10/05/2019, SpO2 100 %. Physical Examination:  General appearance - alert, well appearing, and in no distress Heart - normal rate and regular rhythm Abdomen - soft, nontender, nondistended Fundal Height:  size less than dates Cervical Exam: Not evaluated. Extremities: extremities normal, atraumatic, no cyanosis or edema and Homans sign is negative, no sign of DVT  Membranes:intact  Fetal Monitoring:  Fetal Heart Rate A  Mode External  [removed] filed at 05/27/2020 2252  Baseline Rate (A) 130 bpm filed at 05/27/2020 2252  Variability 6-25 BPM filed at 05/27/2020 2252  Accelerations 15 x 15 filed at 05/27/2020 2252  Decelerations None filed at 05/27/2020 2252     Labs:  Results for orders placed or performed during the hospital encounter of 05/27/20 (from the past 24 hour(s))  Respiratory Panel by RT PCR (Flu A&B, Covid) - Nasopharyngeal Swab   Collection Time: 05/27/20  1:09 PM   Specimen: Nasopharyngeal Swab  Result Value Ref Range   SARS Coronavirus 2 by RT PCR NEGATIVE NEGATIVE   Influenza A by PCR NEGATIVE NEGATIVE   Influenza B by PCR NEGATIVE NEGATIVE  Comprehensive metabolic panel   Collection Time: 05/27/20  1:09 PM  Result Value Ref Range   Sodium 135 135 - 145 mmol/L    Potassium 4.0 3.5 - 5.1 mmol/L   Chloride 104 98 - 111 mmol/L   CO2 21 (L) 22 - 32 mmol/L   Glucose, Bld 192 (H) 70 - 99 mg/dL   BUN 18 6 - 20 mg/dL   Creatinine, Ser 1.08 (H) 0.44 - 1.00 mg/dL   Calcium 8.8 (L) 8.9 - 10.3 mg/dL   Total Protein 6.5 6.5 - 8.1 g/dL   Albumin 2.2 (L) 3.5 - 5.0 g/dL   AST 21 15 - 41 U/L   ALT 16 0 - 44 U/L   Alkaline Phosphatase 81 38 - 126 U/L   Total Bilirubin 0.5 0.3 - 1.2 mg/dL   GFR calc non Af Amer >60 >60 mL/min   GFR calc Af Amer >60 >60 mL/min   Anion gap 10 5 - 15  CBC on admission   Collection Time: 05/27/20  1:09 PM  Result Value Ref Range   WBC 10.3 4.0 - 10.5 K/uL   RBC 3.40 (L) 3.87 - 5.11 MIL/uL   Hemoglobin 10.1 (L) 12.0 - 15.0 g/dL   HCT 30.3 (L) 36 - 46 %   MCV 89.1 80.0 - 100.0 fL   MCH 29.7 26.0 - 34.0 pg   MCHC 33.3 30.0 - 36.0 g/dL   RDW 13.2 11.5 - 15.5 %   Platelets 337 150 - 400 K/uL   nRBC 0.0 0.0 - 0.2 %  Type and screen [place]   Collection Time: 05/27/20  1:09 PM  Result Value Ref Range   ABO/RH(D) B  POS    Antibody Screen NEG    Sample Expiration      05/30/2020,2359 Performed at [facility], [place] [address] [place], [place] [address]   Glucose, capillary   Collection Time: 05/27/20  2:28 PM  Result Value Ref Range   Glucose-Capillary 167 (H) 70 - 99 mg/dL  Glucose, capillary   Collection Time: 05/27/20  5:11 PM  Result Value Ref Range   Glucose-Capillary 165 (H) 70 - 99 mg/dL  Glucose, capillary   Collection Time: 05/27/20  9:46 PM  Result Value Ref Range   Glucose-Capillary 237 (H) 70 - 99 mg/dL  Basic metabolic panel   Collection Time: 05/28/20  4:48 AM  Result Value Ref Range   Sodium 133 (L) 135 - 145 mmol/L   Potassium 3.7 3.5 - 5.1 mmol/L   Chloride 104 98 - 111 mmol/L   CO2 20 (L) 22 - 32 mmol/L   Glucose, Bld 235 (H) 70 - 99 mg/dL   BUN 21 (H) 6 - 20 mg/dL   Creatinine, Ser 0.98 0.44 - 1.00 mg/dL   Calcium 8.6 (L) 8.9 - 10.3 mg/dL   GFR calc non Af Amer >60  >60 mL/min   GFR calc Af Amer >60 >60 mL/min   Anion gap 9 5 - 15  Glucose, capillary   Collection Time: 05/28/20  6:38 AM  Result Value Ref Range   Glucose-Capillary 208 (H) 70 - 99 mg/dL     Medications:  Scheduled . aspirin  81 mg Oral QHS  . betamethasone acetate-betamethasone sodium phosphate  12 mg Intramuscular Q24H  . docusate sodium  100 mg Oral Daily  . furosemide  40 mg Oral BID  . hydrALAZINE  50 mg Oral Q8H  . insulin aspart  0-16 Units Subcutaneous TID PC  . insulin aspart  10 Units Subcutaneous TID WC  . insulin glargine  30 Units Subcutaneous QHS  . labetalol  200 mg Oral Q8H  . NIFEdipine  60 mg Oral QHS  . prenatal multivitamin  1 tablet Oral Q1200  . sodium chloride flush  3 mL Intravenous Q12H  . valACYclovir  500 mg Oral BID   I have reviewed the patient's current medications.  ASSESSMENT: Patient Active Problem List   Diagnosis Date Noted  . Abnormal ultrasound 05/27/2020  . Urinary tract infection affecting care of mother in third trimester, antepartum 05/10/2020  . [redacted] weeks gestation of pregnancy   . Poor glycemic control 05/07/2020  . Pregnancy affected by fetal growth restriction 04/07/2020  . Leg swelling 02/12/2020  . Anemia 02/12/2020  . 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 Type 2 diabetes mellitus in pregnancy, unspecified trimester 01/13/2020  . Hypertension in pregnancy, antepartum 01/13/2020  . Nausea and vomiting in pregnancy 01/13/2020  . Cough 01/12/2020  . Cardiac disease in mother affecting pregnancy in first trimester 12/02/2019  . Acute on chronic diastolic CHF (congestive heart failure) ([place]) 11/26/2019  . Hypertensive crisis 11/26/2019  . Hyperglycemia due to type 2 diabetes mellitus ([place]) 11/24/2019  . Proteinuria affecting pregnancy, antepartum 11/20/2019  . Supervision of high risk pregnancy, antepartum 11/18/2019  . History of herpes genitalis 11/16/2019  . Sleep apnea 07/02/2019  . Healthcare maintenance 02/12/2019  .  Coronary artery disease 12/20/2017  . Hypertension 12/20/2017  . Status post coronary artery stent placement   . Cardiomyopathy ([place]) 11/03/2017  . Hyperlipidemia associated with type 2 diabetes mellitus ([place]) 11/02/2017  . Abdominal obesity and metabolic syndrome 59/56/3875  . NSTEMI (non-ST elevated myocardial  infarction) ([place]) 11/01/2017  . ACS (acute coronary syndrome) ([place]) 11/01/2017  . Type 2 diabetes mellitus without complication ([place]) 09/62/8366  . History of cesarean section 09/11/2014  . Polycystic ovaries 10/31/2006  . Morbid obesity ([place]) 10/31/2006    PLAN: BP control, DM management. BMZ second dose today. RCS BTL in 2 days  Emeterio Reeve 05/28/2020,7:08 AM

## 2020-05-29 DIAGNOSIS — Z3A33 33 weeks gestation of pregnancy: Secondary | ICD-10-CM

## 2020-05-29 DIAGNOSIS — O283 Abnormal ultrasonic finding on antenatal screening of mother: Secondary | ICD-10-CM

## 2020-05-29 LAB — GLUCOSE, CAPILLARY
Glucose-Capillary: 124 mg/dL — ABNORMAL HIGH (ref 70–99)
Glucose-Capillary: 142 mg/dL — ABNORMAL HIGH (ref 70–99)
Glucose-Capillary: 167 mg/dL — ABNORMAL HIGH (ref 70–99)
Glucose-Capillary: 201 mg/dL — ABNORMAL HIGH (ref 70–99)
Glucose-Capillary: 203 mg/dL — ABNORMAL HIGH (ref 70–99)
Glucose-Capillary: 221 mg/dL — ABNORMAL HIGH (ref 70–99)
Glucose-Capillary: 272 mg/dL — ABNORMAL HIGH (ref 70–99)

## 2020-05-29 MED ORDER — INSULIN ASPART 100 UNIT/ML ~~LOC~~ SOLN
12.0000 [IU] | Freq: Three times a day (TID) | SUBCUTANEOUS | Status: DC
  Administered 2020-05-29 (×2): 12 [IU] via SUBCUTANEOUS

## 2020-05-29 MED ORDER — INSULIN ASPART 100 UNIT/ML ~~LOC~~ SOLN
6.0000 [IU] | Freq: Once | SUBCUTANEOUS | Status: AC
  Administered 2020-05-29: 6 [IU] via SUBCUTANEOUS

## 2020-05-29 MED ORDER — INSULIN GLARGINE 100 UNIT/ML ~~LOC~~ SOLN
32.0000 [IU] | Freq: Every day | SUBCUTANEOUS | Status: DC
  Administered 2020-05-29: 32 [IU] via SUBCUTANEOUS
  Filled 2020-05-29 (×2): qty 0.32

## 2020-05-29 MED ORDER — INSULIN ASPART 100 UNIT/ML ~~LOC~~ SOLN: 12.0000 [IU] | Freq: Once | SUBCUTANEOUS | Status: DC

## 2020-05-29 MED ORDER — INSULIN ASPART 100 UNIT/ML ~~LOC~~ SOLN
10.0000 [IU] | Freq: Once | SUBCUTANEOUS | Status: AC
  Administered 2020-05-29: 10 [IU] via SUBCUTANEOUS

## 2020-05-29 MED ORDER — LACTATED RINGERS IV BOLUS
500.0000 mL | Freq: Once | INTRAVENOUS | Status: AC
  Administered 2020-05-29: 500 mL via INTRAVENOUS

## 2020-05-29 NOTE — Progress Notes (Signed)
Patient ID: Lauren Delgado, female   DOB: [birth year], 34 y.o.   MRN: [id number] [place]) NOTE  Lauren Delgado is a 34 y.o. G2P1001 at [redacted]w[redacted]d by best clinical estimate who is admitted for IUGR, AEDF, CHF, HTN, T2DM.   Fetal presentation is cephalic. Length of Stay:  2  Days  ASSESSMENT: Principal Problem:   Abnormal ultrasound Active Problems:   Morbid obesity ([place])   History of cesarean section   Type 2 diabetes mellitus without complication ([place])   NSTEMI (non-ST elevated myocardial infarction) ([place])   Hyperlipidemia associated with type 2 diabetes mellitus ([place])   Cardiomyopathy ([place])   Status post coronary artery stent placement   History of herpes genitalis   Supervision of high risk pregnancy, antepartum   Proteinuria affecting pregnancy, antepartum   Acute on chronic diastolic CHF (congestive heart failure) ([place])   Type 2 diabetes mellitus in pregnancy, unspecified trimester   Hypertension in pregnancy, antepartum   Pregnancy affected by fetal growth restriction   Poor glycemic control   PLAN: For delivery in am. NPO after midnight Increased her Lantus to 32u and meal coverage to 12u. SSI as extra. CBGs are worse due to BMZ effect. Continue Hydralazine, Procardia and Labetalol and lasix for BP control--ok for now. RCS with BTL Repeat labs in am.   Subjective: Patient is feeling well. No complaints today/ Patient reports the fetal movement as active. Patient reports uterine contraction  activity as none. Patient reports  vaginal bleeding as none. Patient describes fluid per vagina as None.  Vitals:  Blood pressure 133/74, pulse 97, temperature 98 F (36.7 C), temperature source Oral, resp. rate 18, height 5\' 2"  (1.575 m), weight 128.8 kg, last menstrual period 10/05/2019, SpO2 95 %. Physical Examination:  General appearance - alert, well appearing, and in no distress Chest - normal effort Abdomen - gravid, non-tender Fundal  Height:  size equals dates Extremities: Homans sign is negative, no sign of DVT  Membranes:intact  Fetal Monitoring:  Baseline: 135 bpm, Variability: Good {> 6 bpm), Accelerations: Reactive and Decelerations: Absent  Labs:  Results for orders placed or performed during the hospital encounter of 05/27/20 (from the past 24 hour(s))  Glucose, capillary   Collection Time: 05/28/20  3:49 PM  Result Value Ref Range   Glucose-Capillary 265 (H) 70 - 99 mg/dL  Glucose, capillary   Collection Time: 05/28/20  8:29 PM  Result Value Ref Range   Glucose-Capillary 292 (H) 70 - 99 mg/dL  Glucose, capillary   Collection Time: 05/28/20 10:05 PM  Result Value Ref Range   Glucose-Capillary 316 (H) 70 - 99 mg/dL  Glucose, capillary   Collection Time: 05/29/20 12:26 AM  Result Value Ref Range   Glucose-Capillary 272 (H) 70 - 99 mg/dL  Glucose, capillary   Collection Time: 05/29/20  1:50 AM  Result Value Ref Range   Glucose-Capillary 221 (H) 70 - 99 mg/dL  Glucose, capillary   Collection Time: 05/29/20  3:17 AM  Result Value Ref Range   Glucose-Capillary 167 (H) 70 - 99 mg/dL  Glucose, capillary   Collection Time: 05/29/20  6:04 AM  Result Value Ref Range   Glucose-Capillary 124 (H) 70 - 99 mg/dL    Medications:  Scheduled . aspirin  81 mg Oral QHS  . docusate sodium  100 mg Oral Daily  . furosemide  40 mg Oral BID  . hydrALAZINE  50 mg Oral Q8H  . insulin aspart  0-16 Units Subcutaneous TID PC  . insulin aspart  12 Units Subcutaneous TID WC  . insulin glargine  32 Units Subcutaneous QHS  . labetalol  200 mg Oral Q8H  . NIFEdipine  60 mg Oral QHS  . prenatal multivitamin  1 tablet Oral Q1200  . sodium chloride flush  3 mL Intravenous Q12H  . valACYclovir  500 mg Oral BID   I have reviewed the patient's current medications.   Donnamae Jude, MD 05/29/2020,12:07 PM

## 2020-05-29 NOTE — Plan of Care (Signed)
  Problem: Education: Goal: Knowledge of disease or condition will improve Outcome: Progressing   

## 2020-05-30 ENCOUNTER — Encounter (HOSPITAL_COMMUNITY)
Admission: AD | Disposition: A | Discharge status: Home / Self Care | Payer: Self-pay | Source: Home / Self Care | Attending: Obstetrics and Gynecology

## 2020-05-30 ENCOUNTER — Inpatient Hospital Stay (HOSPITAL_COMMUNITY): Payer: Medicaid Other | Admitting: Certified Registered Nurse Anesthetist

## 2020-05-30 ENCOUNTER — Encounter (HOSPITAL_COMMUNITY): Payer: Self-pay | Admitting: Obstetrics and Gynecology

## 2020-05-30 DIAGNOSIS — O365930 Maternal care for other known or suspected poor fetal growth, third trimester, not applicable or unspecified: Principal | ICD-10-CM

## 2020-05-30 DIAGNOSIS — Z302 Encounter for sterilization: Secondary | ICD-10-CM

## 2020-05-30 DIAGNOSIS — Z3A33 33 weeks gestation of pregnancy: Secondary | ICD-10-CM

## 2020-05-30 DIAGNOSIS — E119 Type 2 diabetes mellitus without complications: Secondary | ICD-10-CM

## 2020-05-30 DIAGNOSIS — O34211 Maternal care for low transverse scar from previous cesarean delivery: Secondary | ICD-10-CM

## 2020-05-30 DIAGNOSIS — I110 Hypertensive heart disease with heart failure: Secondary | ICD-10-CM

## 2020-05-30 DIAGNOSIS — Z3A34 34 weeks gestation of pregnancy: Secondary | ICD-10-CM

## 2020-05-30 DIAGNOSIS — I509 Heart failure, unspecified: Secondary | ICD-10-CM

## 2020-05-30 DIAGNOSIS — O2412 Pre-existing diabetes mellitus, type 2, in childbirth: Secondary | ICD-10-CM

## 2020-05-30 DIAGNOSIS — O10913 Unspecified pre-existing hypertension complicating pregnancy, third trimester: Secondary | ICD-10-CM

## 2020-05-30 DIAGNOSIS — O9942 Diseases of the circulatory system complicating childbirth: Secondary | ICD-10-CM

## 2020-05-30 LAB — CBC
HCT: 27.6 % — ABNORMAL LOW (ref 36.0–46.0)
HCT: 27.1 % — ABNORMAL LOW (ref 36.0–46.0)
Hemoglobin: 9.3 g/dL — ABNORMAL LOW (ref 12.0–15.0)
Hemoglobin: 9.1 g/dL — ABNORMAL LOW (ref 12.0–15.0)
MCH: 30.6 pg (ref 26.0–34.0)
MCH: 30.1 pg (ref 26.0–34.0)
MCHC: 33.7 g/dL (ref 30.0–36.0)
MCHC: 33.6 g/dL (ref 30.0–36.0)
MCV: 90.8 fL (ref 80.0–100.0)
MCV: 89.7 fL (ref 80.0–100.0)
Platelets: 313 10*3/uL (ref 150–400)
Platelets: 306 10*3/uL (ref 150–400)
RBC: 3.04 MIL/uL — ABNORMAL LOW (ref 3.87–5.11)
RBC: 3.02 MIL/uL — ABNORMAL LOW (ref 3.87–5.11)
RDW: 13.4 % (ref 11.5–15.5)
RDW: 13.6 % (ref 11.5–15.5)
WBC: 12.8 10*3/uL — ABNORMAL HIGH (ref 4.0–10.5)
WBC: 13.5 10*3/uL — ABNORMAL HIGH (ref 4.0–10.5)
nRBC: 0.3 % — ABNORMAL HIGH (ref 0.0–0.2)
nRBC: 0.1 % (ref 0.0–0.2)

## 2020-05-30 LAB — BASIC METABOLIC PANEL WITH GFR
Anion gap: 9 (ref 5–15)
BUN: 21 mg/dL — ABNORMAL HIGH (ref 6–20)
CO2: 22 mmol/L (ref 22–32)
Calcium: 8.8 mg/dL — ABNORMAL LOW (ref 8.9–10.3)
Chloride: 104 mmol/L (ref 98–111)
Creatinine, Ser: 1.03 mg/dL — ABNORMAL HIGH (ref 0.44–1.00)
GFR calc Af Amer: >60 mL/min
GFR calc non Af Amer: >60 mL/min
Glucose, Bld: 96 mg/dL (ref 70–99)
Potassium: 3.7 mmol/L (ref 3.5–5.1)
Sodium: 135 mmol/L (ref 135–145)

## 2020-05-30 LAB — COMPREHENSIVE METABOLIC PANEL
ALT: 17 U/L (ref 0–44)
AST: 15 U/L (ref 15–41)
Albumin: 2 g/dL — ABNORMAL LOW (ref 3.5–5.0)
Alkaline Phosphatase: 78 U/L (ref 38–126)
Anion gap: 11 (ref 5–15)
BUN: 19 mg/dL (ref 6–20)
CO2: 20 mmol/L — ABNORMAL LOW (ref 22–32)
Calcium: 8.7 mg/dL — ABNORMAL LOW (ref 8.9–10.3)
Chloride: 103 mmol/L (ref 98–111)
Creatinine, Ser: 0.9 mg/dL (ref 0.44–1.00)
GFR calc Af Amer: >60 mL/min (ref >60)
GFR calc non Af Amer: >60 mL/min (ref >60)
Glucose, Bld: 191 mg/dL — ABNORMAL HIGH (ref 70–99)
Potassium: 3.4 mmol/L — ABNORMAL LOW (ref 3.5–5.1)
Sodium: 134 mmol/L — ABNORMAL LOW (ref 135–145)
Total Bilirubin: 0.2 mg/dL — ABNORMAL LOW (ref 0.3–1.2)
Total Protein: 5.8 g/dL — ABNORMAL LOW (ref 6.5–8.1)

## 2020-05-30 LAB — TYPE AND SCREEN
ABO/RH(D): B POS
Antibody Screen: NEGATIVE

## 2020-05-30 LAB — GLUCOSE, CAPILLARY
Glucose-Capillary: 107 mg/dL — ABNORMAL HIGH (ref 70–99)
Glucose-Capillary: 143 mg/dL — ABNORMAL HIGH (ref 70–99)
Glucose-Capillary: 129 mg/dL — ABNORMAL HIGH (ref 70–99)
Glucose-Capillary: 201 mg/dL — ABNORMAL HIGH (ref 70–99)
Glucose-Capillary: 181 mg/dL — ABNORMAL HIGH (ref 70–99)

## 2020-05-30 SURGERY — Surgical Case
Anesthesia: Spinal | Site: Abdomen | Wound class: Clean Contaminated

## 2020-05-30 MED ORDER — DIPHENHYDRAMINE HCL 25 MG PO CAPS
25.0000 mg | ORAL_CAPSULE | ORAL | Status: DC | PRN
  Filled 2020-05-30: qty 1

## 2020-05-30 MED ORDER — SCOPOLAMINE 1 MG/3DAYS TD PT72
MEDICATED_PATCH | TRANSDERMAL | Status: AC
  Filled 2020-05-30: qty 1

## 2020-05-30 MED ORDER — ONDANSETRON HCL 4 MG/2ML IJ SOLN: 4.0000 mg | Freq: Three times a day (TID) | INTRAMUSCULAR | Status: DC | PRN

## 2020-05-30 MED ORDER — TETANUS-DIPHTH-ACELL PERTUSSIS 5-2.5-18.5 LF-MCG/0.5 IM SUSP: 0.5000 mL | Freq: Once | INTRAMUSCULAR | Status: DC

## 2020-05-30 MED ORDER — PHENYLEPHRINE HCL-NACL 20-0.9 MG/250ML-% IV SOLN
INTRAVENOUS | Status: AC
  Filled 2020-05-30: qty 250

## 2020-05-30 MED ORDER — SCOPOLAMINE 1 MG/3DAYS TD PT72: 1.0000 | MEDICATED_PATCH | Freq: Once | TRANSDERMAL | Status: DC

## 2020-05-30 MED ORDER — ACETAMINOPHEN 500 MG PO TABS
1000.0000 mg | ORAL_TABLET | ORAL | Status: AC
  Administered 2020-05-30: 1000 mg via ORAL
  Filled 2020-05-30: qty 2

## 2020-05-30 MED ORDER — SCOPOLAMINE 1 MG/3DAYS TD PT72
MEDICATED_PATCH | TRANSDERMAL | Status: DC | PRN
  Administered 2020-05-30: 1 via TRANSDERMAL

## 2020-05-30 MED ORDER — NALBUPHINE HCL 10 MG/ML IJ SOLN: 5.0000 mg | INTRAMUSCULAR | Status: DC | PRN

## 2020-05-30 MED ORDER — MEPERIDINE HCL 25 MG/ML IJ SOLN: 6.2500 mg | INTRAMUSCULAR | Status: DC | PRN

## 2020-05-30 MED ORDER — SENNOSIDES-DOCUSATE SODIUM 8.6-50 MG PO TABS: 2.0000 | ORAL_TABLET | Freq: Every evening | ORAL | Status: DC | PRN

## 2020-05-30 MED ORDER — PROMETHAZINE HCL 25 MG/ML IJ SOLN: 6.2500 mg | INTRAMUSCULAR | Status: DC | PRN

## 2020-05-30 MED ORDER — INSULIN ASPART 100 UNIT/ML ~~LOC~~ SOLN
6.0000 [IU] | Freq: Three times a day (TID) | SUBCUTANEOUS | Status: DC
  Administered 2020-05-30 – 2020-06-01 (×5): 6 [IU] via SUBCUTANEOUS

## 2020-05-30 MED ORDER — PHENYLEPHRINE HCL-NACL 20-0.9 MG/250ML-% IV SOLN
INTRAVENOUS | Status: DC | PRN
  Administered 2020-05-30: 60 ug/min via INTRAVENOUS

## 2020-05-30 MED ORDER — OXYCODONE HCL 5 MG PO TABS: 5.0000 mg | ORAL_TABLET | Freq: Once | ORAL | Status: DC | PRN

## 2020-05-30 MED ORDER — LACTATED RINGERS IV SOLN: INTRAVENOUS | Status: DC | PRN

## 2020-05-30 MED ORDER — ENOXAPARIN SODIUM 60 MG/0.6ML ~~LOC~~ SOLN
60.0000 mg | SUBCUTANEOUS | Status: DC
  Administered 2020-05-31 – 2020-06-01 (×2): 60 mg via SUBCUTANEOUS
  Filled 2020-05-30 (×2): qty 0.6

## 2020-05-30 MED ORDER — MORPHINE SULFATE (PF) 0.5 MG/ML IJ SOLN
INTRAMUSCULAR | Status: DC | PRN
Start: 2020-05-30 — End: 2020-05-30
  Administered 2020-05-30: 150 ug via INTRATHECAL

## 2020-05-30 MED ORDER — FENTANYL CITRATE (PF) 100 MCG/2ML IJ SOLN
INTRAMUSCULAR | Status: DC | PRN
Start: 2020-05-30 — End: 2020-05-30
  Administered 2020-05-30: 15 ug via INTRATHECAL

## 2020-05-30 MED ORDER — OXYTOCIN-SODIUM CHLORIDE 30-0.9 UT/500ML-% IV SOLN
INTRAVENOUS | Status: AC
  Filled 2020-05-30: qty 500

## 2020-05-30 MED ORDER — DEXTROSE 5 % IV SOLN
3.0000 g | INTRAVENOUS | Status: AC
  Administered 2020-05-30: 3 g via INTRAVENOUS

## 2020-05-30 MED ORDER — SODIUM CHLORIDE 0.9 % IR SOLN
Status: DC | PRN
  Administered 2020-05-30: 1000 mL

## 2020-05-30 MED ORDER — KETOROLAC TROMETHAMINE 30 MG/ML IJ SOLN
30.0000 mg | Freq: Once | INTRAMUSCULAR | Status: AC | PRN
  Administered 2020-05-30: 30 mg via INTRAVENOUS

## 2020-05-30 MED ORDER — NALBUPHINE HCL 10 MG/ML IJ SOLN: 5.0000 mg | Freq: Once | INTRAMUSCULAR | Status: DC | PRN

## 2020-05-30 MED ORDER — NALOXONE HCL 0.4 MG/ML IJ SOLN: 0.4000 mg | INTRAMUSCULAR | Status: DC | PRN

## 2020-05-30 MED ORDER — POVIDONE-IODINE 10 % EX SWAB
2.0000 "application " | Freq: Once | CUTANEOUS | Status: DC
  Administered 2020-05-30: 2 via TOPICAL

## 2020-05-30 MED ORDER — COCONUT OIL OIL: 1.0000 "application " | TOPICAL_OIL | Status: DC | PRN

## 2020-05-30 MED ORDER — ONDANSETRON HCL 4 MG/2ML IJ SOLN
INTRAMUSCULAR | Status: DC | PRN
  Administered 2020-05-30: 4 mg via INTRAVENOUS

## 2020-05-30 MED ORDER — NALBUPHINE HCL 10 MG/ML IJ SOLN
5.0000 mg | INTRAMUSCULAR | Status: DC | PRN
  Administered 2020-05-30: 5 mg via INTRAVENOUS
  Filled 2020-05-30: qty 1

## 2020-05-30 MED ORDER — SODIUM CHLORIDE 0.9% FLUSH: 3.0000 mL | INTRAVENOUS | Status: DC | PRN

## 2020-05-30 MED ORDER — SOD CITRATE-CITRIC ACID 500-334 MG/5ML PO SOLN
30.0000 mL | ORAL | Status: AC
  Administered 2020-05-30: 30 mL via ORAL
  Filled 2020-05-30: qty 15

## 2020-05-30 MED ORDER — GABAPENTIN 100 MG PO CAPS
100.0000 mg | ORAL_CAPSULE | ORAL | Status: AC
  Administered 2020-05-30: 100 mg via ORAL
  Filled 2020-05-30: qty 1

## 2020-05-30 MED ORDER — INSULIN ASPART 100 UNIT/ML ~~LOC~~ SOLN
0.0000 [IU] | Freq: Three times a day (TID) | SUBCUTANEOUS | Status: DC
  Administered 2020-05-30: 3 [IU] via SUBCUTANEOUS
  Administered 2020-05-30: 7 [IU] via SUBCUTANEOUS
  Administered 2020-05-31: 5 [IU] via SUBCUTANEOUS
  Administered 2020-05-31: 3 [IU] via SUBCUTANEOUS
  Administered 2020-06-01: 5 [IU] via SUBCUTANEOUS
  Administered 2020-06-01: 3 [IU] via SUBCUTANEOUS

## 2020-05-30 MED ORDER — INSULIN GLARGINE 100 UNIT/ML ~~LOC~~ SOLN
12.0000 [IU] | Freq: Every day | SUBCUTANEOUS | Status: DC
  Administered 2020-05-30 – 2020-06-01 (×2): 12 [IU] via SUBCUTANEOUS
  Filled 2020-05-30 (×3): qty 0.12

## 2020-05-30 MED ORDER — OXYTOCIN-SODIUM CHLORIDE 30-0.9 UT/500ML-% IV SOLN: 2.5000 [IU]/h | INTRAVENOUS | Status: DC

## 2020-05-30 MED ORDER — ONDANSETRON HCL 4 MG/2ML IJ SOLN
INTRAMUSCULAR | Status: AC
  Filled 2020-05-30: qty 2

## 2020-05-30 MED ORDER — FENTANYL CITRATE (PF) 100 MCG/2ML IJ SOLN
INTRAMUSCULAR | Status: AC
  Filled 2020-05-30: qty 2

## 2020-05-30 MED ORDER — MENTHOL 3 MG MT LOZG: 1.0000 | LOZENGE | OROMUCOSAL | Status: DC | PRN

## 2020-05-30 MED ORDER — OXYTOCIN-SODIUM CHLORIDE 30-0.9 UT/500ML-% IV SOLN
INTRAVENOUS | Status: DC | PRN
  Administered 2020-05-30: 30 [IU] via INTRAVENOUS

## 2020-05-30 MED ORDER — SIMETHICONE 80 MG PO CHEW
80.0000 mg | CHEWABLE_TABLET | Freq: Three times a day (TID) | ORAL | Status: DC
  Administered 2020-05-30 – 2020-06-01 (×7): 80 mg via ORAL
  Filled 2020-05-30 (×7): qty 1

## 2020-05-30 MED ORDER — OXYCODONE HCL 5 MG/5ML PO SOLN: 5.0000 mg | Freq: Once | ORAL | Status: DC | PRN

## 2020-05-30 MED ORDER — SODIUM CHLORIDE 0.9 % IV SOLN: INTRAVENOUS | Status: DC | PRN

## 2020-05-30 MED ORDER — NALOXONE HCL 4 MG/10ML IJ SOLN
1.0000 ug/kg/h | INTRAVENOUS | Status: DC | PRN
  Filled 2020-05-30: qty 5

## 2020-05-30 MED ORDER — PHENYLEPHRINE HCL (PRESSORS) 10 MG/ML IV SOLN
INTRAVENOUS | Status: DC | PRN
  Administered 2020-05-30: 80 ug via INTRAVENOUS

## 2020-05-30 MED ORDER — LACTATED RINGERS BOLUS PEDS
500.0000 mL | Freq: Once | INTRAVENOUS | Status: AC
  Administered 2020-05-30: 500 mL via INTRAVENOUS

## 2020-05-30 MED ORDER — LACTATED RINGERS IV SOLN: INTRAVENOUS | Status: DC

## 2020-05-30 MED ORDER — OXYCODONE HCL 5 MG PO TABS
5.0000 mg | ORAL_TABLET | ORAL | Status: DC | PRN
  Administered 2020-05-31 (×2): 10 mg via ORAL
  Administered 2020-06-01: 5 mg via ORAL
  Filled 2020-05-30: qty 2
  Filled 2020-05-30: qty 1
  Filled 2020-05-30: qty 2

## 2020-05-30 MED ORDER — DIBUCAINE (PERIANAL) 1 % EX OINT: 1.0000 "application " | TOPICAL_OINTMENT | CUTANEOUS | Status: DC | PRN

## 2020-05-30 MED ORDER — MORPHINE SULFATE (PF) 0.5 MG/ML IJ SOLN
INTRAMUSCULAR | Status: AC
  Filled 2020-05-30: qty 10

## 2020-05-30 MED ORDER — ACETAMINOPHEN 325 MG PO TABS
650.0000 mg | ORAL_TABLET | Freq: Four times a day (QID) | ORAL | Status: DC
  Administered 2020-05-30 – 2020-06-01 (×8): 650 mg via ORAL
  Filled 2020-05-30 (×8): qty 2

## 2020-05-30 MED ORDER — INSULIN GLARGINE 100 UNIT/ML ~~LOC~~ SOLN
16.0000 [IU] | Freq: Every day | SUBCUTANEOUS | Status: DC
  Filled 2020-05-30: qty 0.16

## 2020-05-30 MED ORDER — STERILE WATER FOR IRRIGATION IR SOLN
Status: DC | PRN
  Administered 2020-05-30: 1000 mL

## 2020-05-30 MED ORDER — INSULIN ASPART 100 UNIT/ML ~~LOC~~ SOLN
3.0000 [IU] | Freq: Once | SUBCUTANEOUS | Status: AC
  Administered 2020-05-30: 3 [IU] via SUBCUTANEOUS
  Filled 2020-05-30: qty 0.03

## 2020-05-30 MED ORDER — DEXTROSE 5 % IV SOLN
INTRAVENOUS | Status: AC
  Filled 2020-05-30: qty 3000

## 2020-05-30 MED ORDER — BUPIVACAINE IN DEXTROSE 0.75-8.25 % IT SOLN
INTRATHECAL | Status: DC | PRN
  Administered 2020-05-30: 1.6 mL via INTRATHECAL

## 2020-05-30 MED ORDER — PRENATAL MULTIVITAMIN CH
1.0000 | ORAL_TABLET | Freq: Every day | ORAL | Status: DC
  Administered 2020-05-30 – 2020-06-01 (×3): 1 via ORAL
  Filled 2020-05-30 (×3): qty 1

## 2020-05-30 MED ORDER — DIPHENHYDRAMINE HCL 25 MG PO CAPS
25.0000 mg | ORAL_CAPSULE | Freq: Four times a day (QID) | ORAL | Status: DC | PRN
  Administered 2020-05-30: 25 mg via ORAL

## 2020-05-30 MED ORDER — DIPHENHYDRAMINE HCL 50 MG/ML IJ SOLN: 12.5000 mg | INTRAMUSCULAR | Status: DC | PRN

## 2020-05-30 MED ORDER — HYDROMORPHONE HCL 1 MG/ML IJ SOLN: 0.2500 mg | INTRAMUSCULAR | Status: DC | PRN

## 2020-05-30 MED ORDER — KETOROLAC TROMETHAMINE 30 MG/ML IJ SOLN
INTRAMUSCULAR | Status: AC
  Filled 2020-05-30: qty 1

## 2020-05-30 MED ORDER — WITCH HAZEL-GLYCERIN EX PADS: 1.0000 "application " | MEDICATED_PAD | CUTANEOUS | Status: DC | PRN

## 2020-05-30 SURGICAL SUPPLY — 33 items
APL SKNCLS STERI-STRIP NONHPOA (GAUZE/BANDAGES/DRESSINGS) ×1
BENZOIN TINCTURE PRP APPL 2/3 (GAUZE/BANDAGES/DRESSINGS) ×2 IMPLANT
CLOTH BEACON ORANGE TIMEOUT ST (SAFETY) ×3 IMPLANT
DRESSING PREVENA PLUS CUSTOM (GAUZE/BANDAGES/DRESSINGS) IMPLANT
DRSG OPSITE POSTOP 4X10 (GAUZE/BANDAGES/DRESSINGS) ×3 IMPLANT
DRSG PREVENA PLUS CUSTOM (GAUZE/BANDAGES/DRESSINGS) ×3
ELECT REM PT RETURN 9FT ADLT (ELECTROSURGICAL) ×3
ELECTRODE REM PT RTRN 9FT ADLT (ELECTROSURGICAL) ×1 IMPLANT
GLOVE BIOGEL PI IND STRL 7.0 (GLOVE) ×1 IMPLANT
GLOVE BIOGEL PI INDICATOR 7.0 (GLOVE) ×2
GLOVE SURG ORTHO 8.0 STRL STRW (GLOVE) ×3 IMPLANT
GOWN STRL REUS W/TWL LRG LVL3 (GOWN DISPOSABLE) ×6 IMPLANT
HOVERMATT SINGLE USE (MISCELLANEOUS) ×2 IMPLANT
KIT PREVENA INCISION MGT20CM45 (CANNISTER) ×2 IMPLANT
NS IRRIG 1000ML POUR BTL (IV SOLUTION) ×3 IMPLANT
PACK C SECTION WH (CUSTOM PROCEDURE TRAY) ×3 IMPLANT
PAD OB MATERNITY 4.3X12.25 (PERSONAL CARE ITEMS) ×3 IMPLANT
PENCIL SMOKE EVAC W/HOLSTER (ELECTROSURGICAL) ×3 IMPLANT
RETRACTOR TRAXI PANNICULUS (MISCELLANEOUS) IMPLANT
RTRCTR C-SECT PINK 25CM LRG (MISCELLANEOUS) ×2 IMPLANT
SUT MON AB-0 CT1 36 (SUTURE) ×6 IMPLANT
SUT PLAIN 0 NONE (SUTURE) ×2 IMPLANT
SUT PLAIN 2 0 XLH (SUTURE) ×2 IMPLANT
SUT VIC AB 0 CT1 27 (SUTURE) ×6
SUT VIC AB 0 CT1 27XBRD ANBCTR (SUTURE) ×2 IMPLANT
SUT VIC AB 0 CT1 36 (SUTURE) ×2 IMPLANT
SUT VIC AB 2-0 CT1 27 (SUTURE) ×3
SUT VIC AB 2-0 CT1 TAPERPNT 27 (SUTURE) ×1 IMPLANT
SUT VIC AB 4-0 KS 27 (SUTURE) ×3 IMPLANT
TOWEL OR 17X24 6PK STRL BLUE (TOWEL DISPOSABLE) ×3 IMPLANT
TRAXI PANNICULUS RETRACTOR (MISCELLANEOUS) ×2
TRAY FOLEY W/BAG SLVR 14FR LF (SET/KITS/TRAYS/PACK) ×3 IMPLANT
WATER STERILE IRR 1000ML POUR (IV SOLUTION) ×3 IMPLANT

## 2020-05-30 NOTE — Anesthesia Postprocedure Evaluation (Signed)
Anesthesia Post Note  Patient: Lauren Delgado  Procedure(s) Performed: CESAREAN SECTION (N/A Abdomen)     Patient location during evaluation: PACU Anesthesia Type: Spinal Level of consciousness: awake and alert Pain management: pain level controlled Vital Signs Assessment: post-procedure vital signs reviewed and stable Respiratory status: spontaneous breathing, nonlabored ventilation and respiratory function stable Cardiovascular status: blood pressure returned to baseline and stable Postop Assessment: no apparent nausea or vomiting Anesthetic complications: no   No complications documented.  Last Vitals:  Vitals:   05/30/20 1230 05/30/20 1242  BP: 139/83 130/77  Pulse: 78 82  Resp: 19 20  Temp:  (!) 36.3 C  SpO2: 100% 100%    Last Pain:  Vitals:   05/30/20 1245  TempSrc:   PainSc: 0-No pain   Pain Goal: Patients Stated Pain Goal: 3 (05/29/20 0900)                 Lynda Rainwater

## 2020-05-30 NOTE — Anesthesia Procedure Notes (Signed)
Spinal  Patient location during procedure: OB Start time: 05/30/2020 9:57 AM End time: 05/30/2020 10:02 AM Staffing Performed: anesthesiologist  Anesthesiologist: Lynda Rainwater, MD Preanesthetic Checklist Completed: patient identified, IV checked, risks and benefits discussed, surgical consent, monitors and equipment checked, pre-op evaluation and timeout performed Spinal Block Patient position: sitting Prep: DuraPrep and site prepped and draped Patient monitoring: heart rate, cardiac monitor, continuous pulse ox and blood pressure Approach: midline Location: L3-4 Injection technique: single-shot Needle Needle type: Pencan  Needle gauge: 24 G Needle length: 10 cm Assessment Sensory level: T4 Additional Notes SAB attempt X 1 by SRNA with 24g.  SAB attempt X 1 by anesthesiologist with 24g.  Needle too short.  Successful SAB by anesthesiologist with 22g [place]
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
## 2020-05-30 NOTE — Progress Notes (Signed)
Inpatient Diabetes Program Recommendations  AACE/ADA: New Consensus Statement on Inpatient Glycemic Control (2015)  Target Ranges:  Prepandial:   less than 140 mg/dL      Peak postprandial:   less than 180 mg/dL (1-2 hours)      Critically ill patients:  140 - 180 mg/dL   Lab Results  Component Value Date   GLUCAP 143 (H) 05/30/2020   HGBA1C 7.7 (H) 05/07/2020    Review of Glycemic Control Results for Lauren Delgado, Lauren Delgado (MRN [id number]) as of 05/30/2020 13:49  Ref. Range 05/29/2020 12:14 05/29/2020 16:06 05/29/2020 21:17 05/30/2020 09:10 05/30/2020 11:51  Glucose-Capillary Latest Ref Range: 70 - 99 mg/dL 203 (H) 201 (H) 142 (H) 107 (H) 143 (H)   Diabetes history:  DM2 Outpatient diabetes medications during pregnancy: Lantus 30 units qhs Novolog 10 units tid with meals Current orders for Inpatient glycemic control:  Lantus 16 units daily Novolog 0-16 units tid after meals Novolog 6 units tid with meals  Inpatient Diabetes Program Recommendations:     Insulin needs will likely decrease after delivery.  Please consider,   Levemir 10 units daily Novolog 0-9 unit tid Hold off on meal coverage for now  Will continue to follow while inpatient.  Thank you, Reche Dixon, RN, BSN Diabetes Coordinator Inpatient Diabetes Program [phone] (team pager from 8a-5p)

## 2020-05-30 NOTE — Op Note (Signed)
Operative Note   SURGERY DATE: 05/30/2020  PRE-OP DIAGNOSIS:  *Pregnancy at 40/0 *FGR at <5% with persistent absent end diastolic umbilical artery dopplers *BMI 50 *History of c-section and desire for repeat *Desire for permanent sterilization  POST-OP DIAGNOSIS: Same. Delivered   PROCEDURE: Repeat low transverse cesarean section via pfannenstiel skin incision with double layer uterine closure and bilateral tubal ligation via [place] Method. Placement of Prevena Dressing  SURGEON: Surgeon(s) and Role:    * [place] Zou, Eduard Clos, MD - Primary  ASSISTANT: None  ANESTHESIA: spinal  ESTIMATED BLOOD LOSS: 548mL  DRAINS: 157mL UOP via indwelling foley  TOTAL IV FLUIDS: 836mL crystalloid  VTE PROPHYLAXIS: SCDs to bilateral lower extremities  ANTIBIOTICS: Three grams of Cefazolin were given., within 1 hour of skin incision  SPECIMENS: placenta to pathology  COMPLICATIONS: None  INDICATIONS: Delivery at 34 weeks per MFM  FINDINGS: No intra-abdominal adhesions were noted. Grossly normal uterus, tubes and ovaries. Clear amniotic fluid, cephalic, female infant, weight 1700gm, APGARs 9/9, intact placenta.  PROCEDURE IN DETAIL: The patient was taken to the operating room where anesthesia was administered and normal fetal heart tones were confirmed. She was then prepped and draped in the normal fashion in the dorsal supine position with a leftward tilt.  After a time out was performed, a pfannensteil  skin incision was made with the scalpel and carried through to the underlying layer of fascia. The fascia was then incised at the midline and this incision was extended laterally with the mayo scissors. Attention was turned to the superior aspect of the fascial incision which was grasped with the kocher clamps x 2, tented up and the rectus muscles were dissected off with the scalpel. In a similar fashion the inferior aspect of the fascial incision was grasped with the kocher clamps, tented up and the  rectus muscles dissected off with the mayo scissors. The rectus muscles were then separated in the midline and the peritoneum was entered bluntly. The bladder blade was inserted and the vesicouterine peritoneum was identified, tented up and entered with the metzenbaum scissors. This incision was extended laterally and the bladder flap was created digitally. The bladder blade was reinserted.  A low transverse hysterotomy was made with the scalpel until the endometrial cavity was breached and the amniotic sac ruptured, yielding clear amniotic fluid. This incision was extended bluntly and the infant's head, shoulders and body were delivered atraumatically.The cord was clamped x 2 and cut, and the infant was handed to the awaiting pediatricians, after delayed cord clamping was done.  The placenta was then gradually expressed from the uterus and then the uterus was exteriorized and cleared of all clots and debris. The hysterotomy was repaired with a running suture of 1-0 Monocryl. A second imbricating layer of 1-0 Monocryl suture was then placed to achieve excellent hemostasis.   The left Fallopian tube was identified by tracing out to the fimbraie, grasped with the Babcock clamps. An avascular midsection of the tube approximately 3-4cm from the cornua was grasped with the babcock clamps and the distal and proximal aspects were ligated with a suture of 2-0 vicryl , with the intervening portion of tube was transected and removed, via the Metzenbaum scissors. Another suture tie was then placed below both stumps.  Attention was then turned to the right fallopian tube after confirmation by tracing the tube out to the fimbriae. The same procedure was then performed on the right Fallopian tube, with excellent hemostasis was noted from both BTL sites.   The uterus  and adnexa were then returned to the abdomen, and the hysterotomy and all operative sites were reinspected and excellent hemostasis was noted after irrigation  and suction of the abdomen with warm saline.  The peritoneum was closed with a running stitch of 3-0 Vicryl. The fascia was reapproximated with 0 Vicryl in a simple running fashion bilaterally. The subcutaneous layer was then reapproximated with interrupted sutures of 2-0 plain gut, and the skin was then closed with 4-0 monocryl, in a subcuticular fashion.  Prevena wound dressing was then placed and and suctioned to desired setting.   The patient  tolerated the procedure well. Sponge, lap, needle, and instrument counts were correct x 2. The patient was transferred to the recovery room awake, alert and breathing independently in stable condition.  Durene Romans MD Attending Center for [facility])

## 2020-05-30 NOTE — Progress Notes (Signed)
Daily Antepartum Note  Admission Date: 05/27/2020 Current Date: 05/30/2020 9:13 AM  Lauren Delgado is a 34 y.o. G2P1001 @ [redacted]w[redacted]d, HD#4, admitted for fetal growth restriction.  Pregnancy complicated by: Patient Active Problem List   Diagnosis Date Noted  . Abnormal ultrasound 05/27/2020  . Urinary tract infection affecting care of mother in third trimester, antepartum 05/10/2020  . Poor glycemic control 05/07/2020  . Pregnancy affected by fetal growth restriction 04/07/2020  . Leg swelling 02/12/2020  . Anemia 02/12/2020  . 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 Type 2 diabetes mellitus in pregnancy, unspecified trimester 01/13/2020  . Hypertension in pregnancy, antepartum 01/13/2020  . Nausea and vomiting in pregnancy 01/13/2020  . Cough 01/12/2020  . Cardiac disease in mother affecting pregnancy in first trimester 12/02/2019  . Acute on chronic diastolic CHF (congestive heart failure) ([place]) 11/26/2019  . Hypertensive crisis 11/26/2019  . Hyperglycemia due to type 2 diabetes mellitus ([place]) 11/24/2019  . Proteinuria affecting pregnancy, antepartum 11/20/2019  . Supervision of high risk pregnancy, antepartum 11/18/2019  . History of herpes genitalis 11/16/2019  . Sleep apnea 07/02/2019  . Healthcare maintenance 02/12/2019  . Coronary artery disease 12/20/2017  . Hypertension 12/20/2017  . Status post coronary artery stent placement   . Cardiomyopathy ([place]) 11/03/2017  . Hyperlipidemia associated with type 2 diabetes mellitus ([place]) 11/02/2017  . Abdominal obesity and metabolic syndrome 67/67/2094  . NSTEMI (non-ST elevated myocardial infarction) ([place]) 11/01/2017  . ACS (acute coronary syndrome) ([place]) 11/01/2017  . Type 2 diabetes mellitus without complication ([place]) 70/96/2836  . History of cesarean section 09/11/2014  . Polycystic ovaries 10/31/2006  . Morbid obesity ([place]) 10/31/2006    Overnight/24hr events:  none  Subjective:  No s/s of pre-eclampsia, decreased fetal  movement, chest pain, sob  Objective:    Current Vital Signs 24h Vital Sign Ranges  T 97.9 F (36.6 C) Temp  Avg: 97.9 F (36.6 C)  Min: 97.5 F (36.4 C)  Max: 98.3 F (36.8 C)  BP 140/72 BP  Min: 120/82  Max: 157/96  HR 93 Pulse  Avg: 94.7  Min: 91  Max: 98  RR 18 Resp  Avg: 17.7  Min: 16  Max: 18  SaO2 99 %  (Room Air) SpO2  Avg: 98.2 %  Min: 95 %  Max: 100 %       24 Hour I/O Current Shift I/O  Time Ins Outs No intake/output data recorded. No intake/output data recorded.   Patient Vitals for the past 24 hrs:  BP Temp Temp src Pulse Resp SpO2  05/30/20 0731 140/72 97.9 F (36.6 C) Oral 93 18 99 %  05/30/20 0620 (!) 157/96 (!) 97.5 F (36.4 C) Oral 98 18 99 %  05/30/20 0010 (!) 142/63 98.3 F (36.8 C) Oral 96 18 98 %  05/29/20 2000 (!) 152/81 -- -- 91 16 98 %  05/29/20 1603 120/82 97.9 F (36.6 C) Oral 93 18 100 %  05/29/20 1113 133/74 98 F (36.7 C) Oral 97 18 95 %   FHTs: 140s. Non stress test just started Physical exam: General: Well nourished, well developed female in no acute distress. Abdomen: gravid, obese, nttp Cardiovascular: S1, S2 normal, no murmur, rub or gallop, regular rate and rhythm Respiratory: CTAB Extremities: no clubbing, cyanosis or edema Skin: Warm and dry.   Medications: Current Facility-Administered Medications  Medication Dose Route Frequency Provider Last Rate Last Admin  . 0.9 %  sodium chloride infusion  250 mL Intravenous PRN Sloan Leiter, MD      .  acetaminophen (TYLENOL) tablet 650 mg  650 mg Oral Q4H PRN Sloan Leiter, MD      . aspirin chewable tablet 81 mg  81 mg Oral QHS Sloan Leiter, MD   81 mg at 05/29/20 2115  . calcium carbonate (TUMS - dosed in mg elemental calcium) chewable tablet 400 mg of elemental calcium  2 tablet Oral Q4H PRN Sloan Leiter, MD      . ceFAZolin (ANCEF) 3 g in dextrose 5 % 50 mL IVPB  3 g Intravenous On Call to [place], MD      . docusate sodium (COLACE) capsule 100 mg  100 mg Oral Daily  Sloan Leiter, MD      . furosemide (LASIX) tablet 40 mg  40 mg Oral BID Sloan Leiter, MD   40 mg at 05/30/20 0802  . hydrALAZINE (APRESOLINE) tablet 50 mg  50 mg Oral Q8H Sloan Leiter, MD   50 mg at 05/30/20 9371  . insulin aspart (novoLOG) injection 0-16 Units  0-16 Units Subcutaneous TID PC Sloan Leiter, MD   3 Units at 05/29/20 2124  . insulin aspart (novoLOG) injection 12 Units  12 Units Subcutaneous TID WC Donnamae Jude, MD   12 Units at 05/29/20 1838  . insulin glargine (LANTUS) injection 32 Units  32 Units Subcutaneous QHS Donnamae Jude, MD   32 Units at 05/29/20 2123  . labetalol (NORMODYNE) tablet 200 mg  200 mg Oral Q8H Sloan Leiter, MD   200 mg at 05/30/20 6967  . lactated ringers infusion   Intravenous Continuous Donnamae Jude, MD 125 mL/hr at 05/30/20 0856 New Bag at 05/30/20 0856  . NIFEdipine (PROCARDIA-XL/NIFEDICAL-XL) 24 hr tablet 60 mg  60 mg Oral QHS Sloan Leiter, MD   60 mg at 05/29/20 2114  . povidone-iodine 10 % swab 2 application  2 application Topical Once Donnamae Jude, MD      . prenatal multivitamin tablet 1 tablet  1 tablet Oral Q1200 Sloan Leiter, MD   1 tablet at 05/29/20 1207  . sodium chloride flush (NS) 0.9 % injection 3 mL  3 mL Intravenous Q12H Sloan Leiter, MD   3 mL at 05/30/20 0853  . sodium chloride flush (NS) 0.9 % injection 3 mL  3 mL Intravenous PRN Sloan Leiter, MD      . sodium citrate-citric acid (ORACIT) solution 30 mL  30 mL Oral On Call to [place], MD      . valACYclovir Estell Harpin) tablet 500 mg  500 mg Oral BID Sloan Leiter, MD   500 mg at 05/29/20 2115    Labs:  Recent Labs  Lab 05/27/20 1309 05/30/20 0525  WBC 10.3 12.8*  HGB 10.1* 9.3*  HCT 30.3* 27.6*  PLT 337 313    Recent Labs  Lab 05/27/20 1309 05/28/20 0448 05/30/20 0525  NA 135 133* 134*  K 4.0 3.7 3.4*  CL 104 104 103  CO2 21* 20* 20*  BUN 18 21* 19  CREATININE 1.08* 0.98 0.90  CALCIUM 8.8* 8.6* 8.7*  PROT 6.5  --  5.8*  BILITOT 0.5   --  0.2*  ALKPHOS 81  --  78  ALT 16  --  17  AST 21  --  15  GLUCOSE 192* 235* 191*   CBG just checked by RN and it's 107  Radiology:  No new imaging 9/24: ceph, 8/8, afi 21, persistent aedf  9/15: normal UA dopplers   Assessment & Plan:  Pt stable *Pregnancy: routine care. EFM currently reassuring *FGR: for delivery this morning. Pt desires rpt c/s. She also desires BTL.  *CV: I touched based with Cardiology and they said to keep on same meds PP. Pt does plan to breastfeed. She has appt for 10/1 and I will talk to her later and if she can't keep then will need to call and change but I think it should be okay. Try to not fluid overload patient PP *DM2: normal CBG this morning. likey will just cut her dose in half PP to lantus 16 qday and aspart 6/6/6.  *Preterm: s/p bmz on 9/24 and 9/25.  *PPx: SCDs *FEN/GI: pt confirms NPO after midnight  Durene Romans MD Attending Center for [facility]) GYN Consult Phone: [phone] (M-F, [phone]) & [phone] (Off hours, weekends, holidays)

## 2020-05-30 NOTE — Anesthesia Preprocedure Evaluation (Signed)
Anesthesia Evaluation  Patient identified by MRN, date of birth, ID band Patient awake    Reviewed: Allergy & Precautions, H&P , Patient's Chart, lab work & pertinent test results  Airway Mallampati: IV  TM Distance: >3 FB Neck ROM: full    Dental   Pulmonary sleep apnea ,    breath sounds clear to auscultation       Cardiovascular hypertension, Pt. on medications + CAD, + Past MI and +CHF   Rhythm:regular Rate:Normal     Neuro/Psych    GI/Hepatic   Endo/Other  diabetesHypothyroidism Morbid obesity  Renal/GU      Musculoskeletal   Abdominal (+) + obese,   Peds  Hematology   Anesthesia Other Findings   Reproductive/Obstetrics (+) Pregnancy                             Anesthesia Physical  Anesthesia Plan  ASA: IV  Anesthesia Plan: Spinal   Post-op Pain Management:    Induction:   PONV Risk Score and Plan: 2 and Treatment may vary due to age or medical condition  Airway Management Planned: Natural Airway  Additional Equipment:   Intra-op Plan:   Post-operative Plan:   Informed Consent: I have reviewed the patients History and Physical, chart, labs and discussed the procedure including the risks, benefits and alternatives for the proposed anesthesia with the patient or authorized representative who has indicated his/her understanding and acceptance.       Plan Discussed with:   Anesthesia Plan Comments:         Anesthesia Quick Evaluation

## 2020-05-30 NOTE — Transfer of Care (Signed)
Immediate Anesthesia Transfer of Care Note  Patient: Lauren Delgado  Procedure(s) Performed: CESAREAN SECTION (N/A Abdomen)  Patient Location: PACU  Anesthesia Type:Spinal  Level of Consciousness: awake, alert  and oriented  Airway & Oxygen Therapy: Patient Spontanous Breathing  Post-op Assessment: Report given to RN and Post -op Vital signs reviewed and stable  Post vital signs: Reviewed and stable  Last Vitals:  Vitals Value Taken Time  BP 136/81 05/30/20 1134  Temp    Pulse 75 05/30/20 1137  Resp 17 05/30/20 1137  SpO2 98 % 05/30/20 1137  Vitals shown include unvalidated device data.  Last Pain:  Vitals:   05/30/20 0745  TempSrc:   PainSc: 0-No pain      Patients Stated Pain Goal: 3 (62/37/62 8315)  Complications: No complications documented.

## 2020-05-31 DIAGNOSIS — I5032 Chronic diastolic (congestive) heart failure: Secondary | ICD-10-CM

## 2020-05-31 DIAGNOSIS — N179 Acute kidney failure, unspecified: Secondary | ICD-10-CM | POA: Clinically undetermined

## 2020-05-31 DIAGNOSIS — I2510 Atherosclerotic heart disease of native coronary artery without angina pectoris: Secondary | ICD-10-CM

## 2020-05-31 LAB — GLUCOSE, CAPILLARY
Glucose-Capillary: 146 mg/dL — ABNORMAL HIGH (ref 70–99)
Glucose-Capillary: 128 mg/dL — ABNORMAL HIGH (ref 70–99)
Glucose-Capillary: 161 mg/dL — ABNORMAL HIGH (ref 70–99)

## 2020-05-31 LAB — CBC
HCT: 24.4 % — ABNORMAL LOW (ref 36.0–46.0)
Hemoglobin: 8 g/dL — ABNORMAL LOW (ref 12.0–15.0)
MCH: 29.9 pg (ref 26.0–34.0)
MCHC: 32.8 g/dL (ref 30.0–36.0)
MCV: 91 fL (ref 80.0–100.0)
Platelets: 272 10*3/uL (ref 150–400)
RBC: 2.68 MIL/uL — ABNORMAL LOW (ref 3.87–5.11)
RDW: 13.5 % (ref 11.5–15.5)
WBC: 11.2 10*3/uL — ABNORMAL HIGH (ref 4.0–10.5)
nRBC: 0.2 % (ref 0.0–0.2)

## 2020-05-31 LAB — BASIC METABOLIC PANEL
Anion gap: 9 (ref 5–15)
BUN: 26 mg/dL — ABNORMAL HIGH (ref 6–20)
CO2: 22 mmol/L (ref 22–32)
Calcium: 8.7 mg/dL — ABNORMAL LOW (ref 8.9–10.3)
Chloride: 104 mmol/L (ref 98–111)
Creatinine, Ser: 1.1 mg/dL — ABNORMAL HIGH (ref 0.44–1.00)
GFR calc Af Amer: >60 mL/min (ref >60)
GFR calc non Af Amer: >60 mL/min (ref >60)
Glucose, Bld: 135 mg/dL — ABNORMAL HIGH (ref 70–99)
Potassium: 3.8 mmol/L (ref 3.5–5.1)
Sodium: 135 mmol/L (ref 135–145)

## 2020-05-31 MED ORDER — SODIUM CHLORIDE 0.9 % IV SOLN
510.0000 mg | Freq: Once | INTRAVENOUS | Status: AC
  Administered 2020-05-31: 510 mg via INTRAVENOUS
  Filled 2020-05-31: qty 17

## 2020-05-31 MED ORDER — BENAZEPRIL HCL 5 MG PO TABS
10.0000 mg | ORAL_TABLET | Freq: Every day | ORAL | Status: DC
  Administered 2020-05-31 – 2020-06-01 (×2): 10 mg via ORAL
  Filled 2020-05-31 (×2): qty 2

## 2020-05-31 NOTE — Lactation Note (Signed)
This note was copied from a baby's chart. Lactation Consultation Note  Patient Name: Lauren Delgado [id number] Date: 05/31/2020 Reason for consult: Follow-up assessment;Late-preterm 34-36.6wks;1st time breastfeeding;Infant < 6lbs;NICU baby  P2 mother whose infant is now 91 hours old.  This is a LPTI at 34+0 weeks with a CGA of 34+1 weeks weighing < 4 lbs and in the NICU.  Mother had a DEBP set up at bedside and has started to pump.  She informed me that "nothing is coming out."  Educated mother on the importance of beginning to use the breast pump on a consistent schedule to help establish a good milk supply.  Reassured mother that, with continued pumping, she will eventually begin to see colostrum drops.  She is familiar with hand expression and I encouraged continuing to practice.  She is able to see a few drops of colostrum.  Container provided and milk storage times reviewed.   Mom made aware of O/P services, breastfeeding support groups, community resources, and our phone # for post-discharge questions.  No support person present at this time, however, mother was engaged in a phone conversation when I arrived.  Offered to return as needed for any questions/concerns.      Maternal Data Formula Feeding for Exclusion: No Has patient been taught Hand Expression?: Yes Does the patient have breastfeeding experience prior to this delivery?: No (Attempted in hospital with first child but baby did not latch)  Feeding Feeding Type: Donor Breast Milk  LATCH Score                   Interventions    Lactation Tools Discussed/Used Pump Review:  (No review needed)   Consult Status Consult Status: Follow-up Date: 06/01/20 Follow-up type: In-patient    [name] R [name] 05/31/2020, 10:17 AM

## 2020-05-31 NOTE — Progress Notes (Signed)
OB Note Patient not in room (in NICU). I will come back later to round. Cardiology consult pending  Durene Romans MD [facility] 05/31/2020 Time: [phone]

## 2020-05-31 NOTE — Lactation Note (Signed)
This note was copied from a baby's chart. Lactation Consultation Note  Patient Name: Lauren Delgado [id number] Date: 05/31/2020 Reason for consult: Follow-up assessment  LC Follow Up Visit:  Attempted to visit with mother, however, she was not in her room.  I will attempt to visit with her during my NICU rounds.   Maternal Data    Feeding Feeding Type: Donor Breast Milk  LATCH Score                   Interventions    Lactation Tools Discussed/Used     Consult Status Consult Status: Follow-up Date: 06/01/20 Follow-up type: In-patient    Demir Titsworth R Jessey Stehlin 05/31/2020, 8:42 AM

## 2020-05-31 NOTE — Lactation Note (Signed)
This note was copied from a baby's chart. Lactation Consultation Note Baby 68 hrs old in NICU, 34 wks. Gest. LC went into rm w/Lab. Mom resting, didn't look happy. LC saw DEBP connected at bedside. LC mention to mom that Lactation would come back to see her during the day when she wasn't resting. Lactation brochure and NICU booklet left at bedside w/pumping kit. LC told mom I was leaving this information, mom stated OK. Mom seems tired. LC informed RN of attempted visit.  Patient Name: Lauren Delgado Today's Date: 05/31/2020     Maternal Data    Feeding Feeding Type: Donor Breast Milk  LATCH Score                   Interventions    Lactation Tools Discussed/Used     Consult Status      Sweta Halseth, Elta Guadeloupe 05/31/2020, 5:57 AM
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
## 2020-05-31 NOTE — Progress Notes (Signed)
Daily Antepartum Note  Admission Date: 05/27/2020 Current Date: 05/31/2020 11:10 AM  Lauren Delgado is a 34 y.o. [id number] HD#5/POD#1 rpt c-section and BTL @ 34wks for fetal growth restriction. Patient admitted for fetal growth restriction  Pregnancy complicated by: Patient Active Problem List   Diagnosis Date Noted  . AKI (acute kidney injury) ([place]) 05/31/2020  . Abnormal ultrasound 05/27/2020  . Urinary tract infection affecting care of mother in third trimester, antepartum 05/10/2020  . Poor glycemic control 05/07/2020  . Pregnancy affected by fetal growth restriction 04/07/2020  . Leg swelling 02/12/2020  . Anemia 02/12/2020  . 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 Type 2 diabetes mellitus in pregnancy, unspecified trimester 01/13/2020  . Hypertension in pregnancy, antepartum 01/13/2020  . Nausea and vomiting in pregnancy 01/13/2020  . Cough 01/12/2020  . Cardiac disease in mother affecting pregnancy in first trimester 12/02/2019  . Acute on chronic diastolic CHF (congestive heart failure) ([place]) 11/26/2019  . Hypertensive crisis 11/26/2019  . Hyperglycemia due to type 2 diabetes mellitus ([place]) 11/24/2019  . Proteinuria affecting pregnancy, antepartum 11/20/2019  . Supervision of high risk pregnancy, antepartum 11/18/2019  . History of herpes genitalis 11/16/2019  . Sleep apnea 07/02/2019  . Healthcare maintenance 02/12/2019  . Coronary artery disease 12/20/2017  . Hypertension 12/20/2017  . Status post coronary artery stent placement   . Cardiomyopathy ([place]) 11/03/2017  . Hyperlipidemia associated with type 2 diabetes mellitus ([place]) 11/02/2017  . Abdominal obesity and metabolic syndrome 40/97/3532  . NSTEMI (non-ST elevated myocardial infarction) ([place]) 11/01/2017  . ACS (acute coronary syndrome) ([place]) 11/01/2017  . Type 2 diabetes mellitus without complication ([place]) 99/24/2683  . History of cesarean section 09/11/2014  . Polycystic ovaries 10/31/2006  . Morbid obesity  ([place]) 10/31/2006    Overnight/24hr events:  BP meds held overnight due to BPs being low  Subjective:  No chest pain, sob, flatus, nausea, vomiting. Normal lochia. No s/s of anemia.   Objective:    Current Vital Signs 24h Vital Sign Ranges  T 98.1 F (36.7 C) Temp  Avg: 97.8 F (36.6 C)  Min: 97.4 F (36.3 C)  Max: 98.1 F (36.7 C)  BP 133/77 BP  Min: 83/59  Max: 145/87  HR 92 Pulse  Avg: 85.9  Min: 76  Max: 111  RR 18 Resp  Avg: 18.1  Min: 16  Max: 21  SaO2 100 % (room air) Room Air SpO2  Avg: 99.3 %  Min: 97 %  Max: 100 %       24 Hour I/O Current Shift I/O  Time Ins Outs 09/27 0701 - 09/28 0700 In: 2211.7 [P.O.:1260; I.V.:841.7] Out: 1498 [Urine:990] 09/28 0701 - 09/28 1900 In: 0  Out: 100 [Urine:100]    Intake/Output Summary (Last 24 hours) at 05/31/2020 1113 Last data filed at 05/31/2020 0800 Gross per 24 hour  Intake 2161.7 ml  Output 1448 ml  Net 713.7 ml   Patient Vitals for the past 24 hrs:  BP Temp Temp src Pulse Resp SpO2  05/31/20 0821 133/77 98.1 F (36.7 C) Oral 92 18 100 %  05/31/20 0609 135/70 98.1 F (36.7 C) Oral 91 17 99 %  05/31/20 0100 132/81 (!) 97.5 F (36.4 C) Oral 94 18 98 %  05/30/20 2235 98/66 -- -- (!) 105 -- --  05/30/20 2232 (!) 89/55 -- -- (!) 111 -- --  05/30/20 2226 119/71 -- -- 91 -- --  05/30/20 2225 (!) 111/56 -- -- 86 -- --  05/30/20 2103 (!) 116/57 -- --  84 17 100 %  05/30/20 1941 (!) 83/59 98 F (36.7 C) Oral 82 16 100 %  05/30/20 1749 -- -- -- -- -- 100 %  05/30/20 1744 -- -- -- -- -- 100 %  05/30/20 1739 -- -- -- -- -- 100 %  05/30/20 1734 -- -- -- -- -- 99 %  05/30/20 1729 -- -- -- -- -- 99 %  05/30/20 1724 -- -- -- -- -- 99 %  05/30/20 1719 -- -- -- -- -- 99 %  05/30/20 1714 -- -- -- -- -- 99 %  05/30/20 1709 -- -- -- -- -- 100 %  05/30/20 1704 -- -- -- -- -- 99 %  05/30/20 1700 -- -- -- -- -- 99 %  05/30/20 1654 -- -- -- -- -- 99 %  05/30/20 1644 -- -- -- -- -- 100 %  05/30/20 1639 -- -- -- -- -- 100 %   05/30/20 1634 -- -- -- -- -- 100 %  05/30/20 1629 -- -- -- -- -- 99 %  05/30/20 1624 -- -- -- -- -- 99 %  05/30/20 1619 -- -- -- -- -- 99 %  05/30/20 1614 -- -- -- -- -- 99 %  05/30/20 1609 -- -- -- -- -- 99 %  05/30/20 1604 -- -- -- -- -- 99 %  05/30/20 1600 122/74 -- -- 83 16 --  05/30/20 1559 -- -- -- -- -- 99 %  05/30/20 1508 113/76 (!) 97.5 F (36.4 C) Oral 76 16 --  05/30/20 1500 -- -- -- -- -- 99 %  05/30/20 1403 -- 97.8 F (36.6 C) Oral -- -- --  05/30/20 1401 129/80 -- -- 81 18 --  05/30/20 1302 131/74 -- -- 77 18 --  05/30/20 1242 130/77 (!) 97.4 F (36.3 C) Oral 82 20 100 %  05/30/20 1230 139/83 -- -- 78 19 100 %  05/30/20 1215 (!) 144/89 97.8 F (36.6 C) Oral 77 20 99 %  05/30/20 1200 (!) 145/87 -- -- 79 19 99 %  05/30/20 1145 135/76 -- -- 82 (!) 21 99 %  05/30/20 1134 136/81 97.9 F (36.6 C) Oral 82 19 97 %   UOP: 50-64mL/hr  Physical exam: General: Well nourished, well developed female in no acute distress. Abdomen: obese, prevena dressing functioning correctly. Rare BS, nttp Cardiovascular: S1, S2 normal, no murmur, rub or gallop, regular rate and rhythm GU: foley with clear UOP. Approximately 91mL in bag.  Respiratory: CTAB Extremities: no clubbing, cyanosis or edema Skin: Warm and dry.   Medications: Current Facility-Administered Medications  Medication Dose Route Frequency Provider Last Rate Last Admin  . 0.9 %  sodium chloride infusion  250 mL Intravenous PRN Aletha Halim, MD      . acetaminophen (TYLENOL) tablet 650 mg  650 mg Oral Q6H Aletha Halim, MD   650 mg at 05/31/20 9323  . aspirin chewable tablet 81 mg  81 mg Oral QHS Aletha Halim, MD   81 mg at 05/30/20 2244  . calcium carbonate (TUMS - dosed in mg elemental calcium) chewable tablet 400 mg of elemental calcium  2 tablet Oral Q4H PRN Aletha Halim, MD      . coconut oil  1 application Topical PRN Aletha Halim, MD      . witch hazel-glycerin (TUCKS) pad 1 application  1  application Topical PRN Aletha Halim, MD       And  . dibucaine (NUPERCAINAL) 1 % rectal ointment 1 application  1 application Rectal  PRN Aletha Halim, MD      . diphenhydrAMINE (BENADRYL) injection 12.5 mg  12.5 mg Intravenous Q4H PRN Lynda Rainwater, MD       Or  . diphenhydrAMINE (BENADRYL) capsule 25 mg  25 mg Oral Q4H PRN Lynda Rainwater, MD      . diphenhydrAMINE (BENADRYL) capsule 25 mg  25 mg Oral Q6H PRN Aletha Halim, MD   25 mg at 05/30/20 1645  . enoxaparin (LOVENOX) injection 60 mg  60 mg Subcutaneous Q24H Aletha Halim, MD   60 mg at 05/31/20 0818  . ferumoxytol (FERAHEME) 510 mg in sodium chloride 0.9 % 100 mL IVPB  510 mg Intravenous Once Aletha Halim, MD      . furosemide (LASIX) tablet 40 mg  40 mg Oral BID Aletha Halim, MD   40 mg at 05/31/20 0817  . hydrALAZINE (APRESOLINE) tablet 50 mg  50 mg Oral Q8H Aletha Halim, MD   50 mg at 05/31/20 0612  . insulin aspart (novoLOG) injection 0-16 Units  0-16 Units Subcutaneous TID Ria Clock, MD   7 Units at 05/30/20 2115  . insulin aspart (novoLOG) injection 6 Units  6 Units Subcutaneous TID WC Aletha Halim, MD   6 Units at 05/31/20 1046  . insulin glargine (LANTUS) injection 12 Units  12 Units Subcutaneous QHS Aletha Halim, MD   12 Units at 05/30/20 2245  . labetalol (NORMODYNE) tablet 200 mg  200 mg Oral Q8H Aletha Halim, MD   200 mg at 05/31/20 6160  . menthol-cetylpyridinium (CEPACOL) lozenge 3 mg  1 lozenge Oral Q2H PRN Aletha Halim, MD      . nalbuphine (NUBAIN) injection 5 mg  5 mg Intravenous Q4H PRN Lynda Rainwater, MD   5 mg at 05/30/20 1857   Or  . nalbuphine (NUBAIN) injection 5 mg  5 mg Subcutaneous Q4H PRN Lynda Rainwater, MD      . nalbuphine (NUBAIN) injection 5 mg  5 mg Intravenous Once PRN Lynda Rainwater, MD       Or  . nalbuphine (NUBAIN) injection 5 mg  5 mg Subcutaneous Once PRN Lynda Rainwater, MD      . naloxone [facility]) injection 0.4 mg  0.4  mg Intravenous PRN Lynda Rainwater, MD       And  . sodium chloride flush (NS) 0.9 % injection 3 mL  3 mL Intravenous PRN Lynda Rainwater, MD      . naloxone HCl [facility]) 2 mg in dextrose 5 % 250 mL infusion  1-4 mcg/kg/hr Intravenous Continuous PRN Lynda Rainwater, MD      . NIFEdipine (PROCARDIA-XL/NIFEDICAL-XL) 24 hr tablet 60 mg  60 mg Oral QHS Aletha Halim, MD   60 mg at 05/29/20 2114  . ondansetron (ZOFRAN) injection 4 mg  4 mg Intravenous Q8H PRN Lynda Rainwater, MD      . oxyCODONE (Oxy IR/ROXICODONE) immediate release tablet 5-10 mg  5-10 mg Oral Q4H PRN Aletha Halim, MD   10 mg at 05/31/20 0953  . prenatal multivitamin tablet 1 tablet  1 tablet Oral Q1200 Aletha Halim, MD   1 tablet at 05/30/20 1429  . scopolamine (TRANSDERM-SCOP) 1 MG/3DAYS 1.5 mg  1 patch Transdermal Once Lynda Rainwater, MD      . senna-docusate (Senokot-S) tablet 2 tablet  2 tablet Oral QHS PRN Aletha Halim, MD      . simethicone [facility]) chewable tablet 80 mg  80 mg Oral TID PC Irby Fails, Ivan,  MD   80 mg at 05/31/20 0953  . sodium chloride flush (NS) 0.9 % injection 3 mL  3 mL Intravenous PRN Aletha Halim, MD      . Tdap (BOOSTRIX) injection 0.5 mL  0.5 mL Intramuscular Once Aletha Halim, MD        Labs:  Recent Labs  Lab 05/30/20 0525 05/30/20 1801 05/31/20 0555  WBC 12.8* 13.5* 11.2*  HGB 9.3* 9.1* 8.0*  HCT 27.6* 27.1* 24.4*  PLT 313 306 272    Recent Labs  Lab 05/27/20 1309 05/28/20 0448 05/30/20 0525 05/30/20 1801 05/31/20 0959  NA 135   < > 134* 135 135  K 4.0   < > 3.4* 3.7 3.8  CL 104   < > 103 104 104  CO2 21*   < > 20* 22 22  BUN 18   < > 19 21* 26*  CREATININE 1.08*   < > 0.90 1.03* 1.10*  CALCIUM 8.8*   < > 8.7* 8.8* 8.7*  PROT 6.5  --  5.8*  --   --   BILITOT 0.5  --  0.2*  --   --   ALKPHOS 81  --  78  --   --   ALT 16  --  17  --   --   AST 21  --  15  --   --   GLUCOSE 192*   < > 191* 96 135*   < > = values in this interval not  displayed.   Results for KAYANI, RAPAPORT (MRN [id number]) as of 05/31/2020 09:03  Ref. Range 05/30/2020 18:01 05/30/2020 20:51 05/30/2020 22:29 05/31/2020 05:55 05/31/2020 08:32  Glucose-Capillary Latest Ref Range: 70 - 99 mg/dL  201 (H) 181 (H)  146 (H)    Radiology:  No new imaging  Assessment & Plan:  Pt stable *Postpartum: B POS. Breast feeding *CV: Cardiology called and they will see this morning. Meds continue to be held *Renal: Rpt BMP ordered for this morning shows slight increase in Cr close to her admit level. Foley in place with adequate UOP, will d/c foley. Continue home lasix for now *DM2: will see how she does today since she got lantus restarted last night.  *Anemia: asymptomatic. Rpt cbc tomorrow. IV feraheme ordered.  *PPx: lovenox, oob ad lib *FEN/GI: regular diet *Dispo: likely pod#3-4  Durene Romans MD Attending Center for [place] (Faculty Practice) GYN Consult Phone: [phone] (M-F, 0800-1700) & [phone] (Off hours, weekends, holidays)

## 2020-06-01 ENCOUNTER — Ambulatory Visit: Payer: Self-pay

## 2020-06-01 ENCOUNTER — Other Ambulatory Visit: Payer: Medicaid Other

## 2020-06-01 ENCOUNTER — Ambulatory Visit: Payer: Medicaid Other

## 2020-06-01 ENCOUNTER — Inpatient Hospital Stay (HOSPITAL_COMMUNITY): Payer: Medicaid Other

## 2020-06-01 DIAGNOSIS — I5022 Chronic systolic (congestive) heart failure: Secondary | ICD-10-CM

## 2020-06-01 DIAGNOSIS — R9389 Abnormal findings on diagnostic imaging of other specified body structures: Secondary | ICD-10-CM

## 2020-06-01 LAB — BASIC METABOLIC PANEL
Anion gap: 8 (ref 5–15)
BUN: 21 mg/dL — ABNORMAL HIGH (ref 6–20)
CO2: 24 mmol/L (ref 22–32)
Calcium: 8.8 mg/dL — ABNORMAL LOW (ref 8.9–10.3)
Chloride: 106 mmol/L (ref 98–111)
Creatinine, Ser: 0.94 mg/dL (ref 0.44–1.00)
GFR calc Af Amer: >60 mL/min (ref >60)
GFR calc non Af Amer: >60 mL/min (ref >60)
Glucose, Bld: 136 mg/dL — ABNORMAL HIGH (ref 70–99)
Potassium: 3.6 mmol/L (ref 3.5–5.1)
Sodium: 138 mmol/L (ref 135–145)

## 2020-06-01 LAB — ECHOCARDIOGRAM COMPLETE
Area-P 1/2: 3.48 cm2
Calc EF: 44.7 %
Height: 62 in
S' Lateral: 3.9 cm
Single Plane A2C EF: 45.7 %
Single Plane A4C EF: 47 %
Weight: 4544 oz

## 2020-06-01 LAB — GLUCOSE, CAPILLARY
Glucose-Capillary: 135 mg/dL — ABNORMAL HIGH (ref 70–99)
Glucose-Capillary: 188 mg/dL — ABNORMAL HIGH (ref 70–99)

## 2020-06-01 LAB — SURGICAL PATHOLOGY

## 2020-06-01 LAB — CBC
HCT: 23.9 % — ABNORMAL LOW (ref 36.0–46.0)
Hemoglobin: 7.8 g/dL — ABNORMAL LOW (ref 12.0–15.0)
MCH: 29.9 pg (ref 26.0–34.0)
MCHC: 32.6 g/dL (ref 30.0–36.0)
MCV: 91.6 fL (ref 80.0–100.0)
Platelets: 254 10*3/uL (ref 150–400)
RBC: 2.61 MIL/uL — ABNORMAL LOW (ref 3.87–5.11)
RDW: 13.6 % (ref 11.5–15.5)
WBC: 10.1 10*3/uL (ref 4.0–10.5)
nRBC: 0 % (ref 0.0–0.2)

## 2020-06-01 MED ORDER — POTASSIUM CHLORIDE CRYS ER 20 MEQ PO TBCR: 20.0000 meq | EXTENDED_RELEASE_TABLET | Freq: Every day | ORAL | Status: DC

## 2020-06-01 MED ORDER — OXYCODONE-ACETAMINOPHEN 5-325 MG PO TABS: 1.0000 | ORAL_TABLET | Freq: Four times a day (QID) | ORAL | 0 refills | Status: DC | PRN

## 2020-06-01 MED ORDER — LABETALOL HCL 200 MG PO TABS
200.0000 mg | ORAL_TABLET | Freq: Three times a day (TID) | ORAL | 1 refills | Status: DC
Start: 2020-06-01 — End: 2020-07-06

## 2020-06-01 MED ORDER — BENAZEPRIL HCL 10 MG PO TABS
10.0000 mg | ORAL_TABLET | Freq: Every day | ORAL | 1 refills | Status: DC
Start: 2020-06-02 — End: 2020-06-07

## 2020-06-01 MED ORDER — FUROSEMIDE 40 MG PO TABS: 40.0000 mg | ORAL_TABLET | Freq: Every day | ORAL | Status: DC

## 2020-06-01 MED ORDER — SIMETHICONE 80 MG PO CHEW: 80.0000 mg | CHEWABLE_TABLET | Freq: Four times a day (QID) | ORAL | 0 refills | Status: DC | PRN

## 2020-06-01 MED ORDER — INSULIN ASPART 100 UNIT/ML ~~LOC~~ SOLN: 8.0000 [IU] | Freq: Three times a day (TID) | SUBCUTANEOUS | 11 refills | Status: DC

## 2020-06-01 MED ORDER — POTASSIUM CHLORIDE CRYS ER 20 MEQ PO TBCR: 20.0000 meq | EXTENDED_RELEASE_TABLET | Freq: Every day | ORAL | 1 refills | Status: DC

## 2020-06-01 MED ORDER — INSULIN GLARGINE 100 UNIT/ML ~~LOC~~ SOLN: 17.0000 [IU] | Freq: Every day | SUBCUTANEOUS | 11 refills | Status: DC

## 2020-06-01 MED ORDER — FUROSEMIDE 40 MG PO TABS
40.0000 mg | ORAL_TABLET | Freq: Every day | ORAL | 1 refills | Status: DC
Start: 2020-06-02 — End: 2021-09-05

## 2020-06-01 MED ORDER — INSULIN GLARGINE 100 UNIT/ML ~~LOC~~ SOLN
15.0000 [IU] | Freq: Every day | SUBCUTANEOUS | Status: DC
  Filled 2020-06-01: qty 0.15

## 2020-06-01 MED FILL — BENAZEPRIL HCL 10 MG TABLET: 10 | 30 days supply | Qty: 30 | Fill #0

## 2020-06-01 MED FILL — FUROSEMIDE 40 MG TABLET: 40 | 30 days supply | Qty: 30 | Fill #0

## 2020-06-01 MED FILL — MI-ACID GAS 80 MG TAB CHEW: 80 | 7 days supply | Qty: 30 | Fill #0

## 2020-06-01 MED FILL — POTASSIUM CHLORIDE 20meqER: 20 | 30 days supply | Qty: 30 | Fill #0

## 2020-06-01 MED FILL — OXYCODONE-APAP 5-325MG: 5-325 | 4 days supply | Qty: 30 | Fill #0

## 2020-06-01 NOTE — Progress Notes (Addendum)
Advanced Heart Failure Rounding Note  PCP-Cardiologist: Pixie Casino, MD   Subjective:   Yesterday hydralazine stopped and benazepril added.   ECHO completed EF 55% with Grade II DD.   Denies SOB. Feeling better.   Objective:   Weight Range: 128.8 kg Body mass index is 51.94 kg/m.   Vital Signs:   Temp:  [97.7 F (36.5 C)-98.6 F (37 C)] 97.7 F (36.5 C) (09/29 0822) Pulse Rate:  [84-94] 84 (09/29 0822) Resp:  [17-20] 20 (09/29 0822) BP: (104-130)/(47-65) 104/64 (09/29 0822) SpO2:  [98 %-100 %] 100 % (09/29 0822) Last BM Date: 05/30/20  Weight change: Filed Weights   05/27/20 1130  Weight: 128.8 kg    Intake/Output:   Intake/Output Summary (Last 24 hours) at 06/01/2020 1022 Last data filed at 06/01/2020 0435 Gross per 24 hour  Intake --  Output 2800 ml  Net -2800 ml      Physical Exam    General:  Well appearing. No resp difficulty. Sitting on the side of the bed.  HEENT: Normal Neck: Supple. JVP flat  . Carotids 2+ bilat; no bruits. No lymphadenopathy or thyromegaly appreciated. Cor: PMI nondisplaced. Regular rate & rhythm. No rubs, gallops or murmurs. Lungs: Clear Abdomen: Soft, nontender, nondistended. No hepatosplenomegaly. No bruits or masses. Good bowel sounds. Extremities: No cyanosis, clubbing, rash, edema Neuro: Alert & orientedx3, cranial nerves grossly intact. moves all 4 extremities w/o difficulty. Affect pleasant   Telemetry   n/a  EKG    n/a  Labs    CBC Recent Labs    05/31/20 0555 06/01/20 0537  WBC 11.2* 10.1  HGB 8.0* 7.8*  HCT 24.4* 23.9*  MCV 91.0 91.6  PLT 272 024   Basic Metabolic Panel Recent Labs    05/31/20 0959 06/01/20 0537  NA 135 138  K 3.8 3.6  CL 104 106  CO2 22 24  GLUCOSE 135* 136*  BUN 26* 21*  CREATININE 1.10* 0.94  CALCIUM 8.7* 8.8*   Liver Function Tests Recent Labs    05/30/20 0525  AST 15  ALT 17  ALKPHOS 78  BILITOT 0.2*  PROT 5.8*  ALBUMIN 2.0*   No results for  input(s): LIPASE, AMYLASE in the last 72 hours. Cardiac Enzymes No results for input(s): CKTOTAL, CKMB, CKMBINDEX, TROPONINI in the last 72 hours.  BNP: BNP (last 3 results) Recent Labs    11/24/19 0245 12/10/19 1004 01/12/20 1543  BNP 123.0* 194.7* 141.7*    ProBNP (last 3 results) No results for input(s): PROBNP in the last 8760 hours.   D-Dimer No results for input(s): DDIMER in the last 72 hours. Hemoglobin A1C No results for input(s): HGBA1C in the last 72 hours. Fasting Lipid Panel No results for input(s): CHOL, HDL, LDLCALC, TRIG, CHOLHDL, LDLDIRECT in the last 72 hours. Thyroid Function Tests No results for input(s): TSH, T4TOTAL, T3FREE, THYROIDAB in the last 72 hours.  Invalid input(s): FREET3  Other results:   Imaging     No results found.   Medications:     Scheduled Medications: . acetaminophen  650 mg Oral Q6H  . aspirin  81 mg Oral QHS  . benazepril  10 mg Oral Daily  . enoxaparin (LOVENOX) injection  60 mg Subcutaneous Q24H  . furosemide  40 mg Oral BID  . insulin aspart  0-16 Units Subcutaneous TID PC  . insulin aspart  6 Units Subcutaneous TID WC  . insulin glargine  12 Units Subcutaneous QHS  . labetalol  200 mg Oral  Q8H  . NIFEdipine  60 mg Oral QHS  . prenatal multivitamin  1 tablet Oral Q1200  . scopolamine  1 patch Transdermal Once  . simethicone  80 mg Oral TID PC  . Tdap  0.5 mL Intramuscular Once     Infusions: . sodium chloride    . naLOXone [facility]) adult infusion for PRURITIS       PRN Medications:  sodium chloride, calcium carbonate, coconut oil, witch hazel-glycerin **AND** dibucaine, diphenhydrAMINE **OR** diphenhydrAMINE, diphenhydrAMINE, menthol-cetylpyridinium, nalbuphine **OR** nalbuphine, nalbuphine **OR** nalbuphine, naloxone **AND** sodium chloride flush, naLOXone (NARCAN) adult infusion for PRURITIS, ondansetron (ZOFRAN) IV, oxyCODONE, senna-docusate, sodium chloride flush    Assessment/Plan  1. S/P C  Section & bilateral tubal ligation   2. Chronic Systolic HF  ICM. ECHOs EF 2019 EF 35-40% --> EF 02/2020 EF 55% RV normal  - ECHO repeated today. EF 60-65% RV normal Grade II DD. ECHO discussed and reviewed by Dr Haroldine Laws.  - Volume status stable. Cut back lasix to 40 mg daily.   - Plans to breast feed for this reason will stop hydralazine/imdur.  - Reviewed with pharmacy team.   - Continue labetalol 200 mg three times a day .  - Continue benazepril 10 mg daily  - Continue procardia 60 mg daily - Renal function stable.   3. CAD S/P 3V stenting with 80% prox RCA (PCI + DES), 70% distal RCA (treated medically), 80% RI (PCI +DES) and 99% prox to mid LAD (PCI +DES) - No chest pain.  - No statin with breast feeding. Once she stops breast feeding will need to restart atorvastatin again.  - On aspirin.   4. HTN  -Stable today.   Will set up follow up in the HF clinic.   Heart failure team will sign off as of 06/01/20  HF Team Medication Recommendations for Home: Lasix 40 mg daily  Labetalol 200 mg three times a day Benazepril 10 mg daily Procardia 60 mg daily Aspirin 81 mg daily     Length of Stay: 5  Amy Clegg, NP  06/01/2020, 10:22 AM  Advanced Heart Failure Team Pager [phone] (M-F; 7a - 4p)  Please contact [facility] for night-coverage after hours (4p -7a ) and weekends on [email or web address]  Doing great today. BP improved. No edema.   Echo reviewed personaly EF 55% Grade II DD.   General:  Well appearing. No resp difficulty HEENT: normal Neck: supple. no JVD. Carotids 2+ bilat; no bruits. No lymphadenopathy or thryomegaly appreciated. Cor: PMI nondisplaced. Regular rate & rhythm. No rubs, gallops or murmurs. Lungs: clear Abdomen: obese soft, nontender, nondistended. No hepatosplenomegaly. No bruits or masses. Good bowel sounds. Extremities: no cyanosis, clubbing, rash, edema Neuro: alert & orientedx3, cranial nerves grossly intact. moves all 4 extremities w/o  difficulty. Affect pleasant  Doing well. BP and volume status ok. No ischemic symptoms.   Can go home today from our standpoint on above regimen. Can wean lasix as needed. Med therapy limited by lactation status. If stops breastfeeding will adjust meds.   Glori Bickers, MD  11:33 AM

## 2020-06-01 NOTE — Discharge Summary (Signed)
Postpartum Discharge Summary      Patient Name: Lauren Delgado DOB: Jun 16, 1986 MRN: [id number]  Date of admission: 05/27/2020 Delivery date:05/30/2020  Delivering provider: Griffin Basil  Date of discharge: 06/01/2020  Admitting diagnosis: Pregnancy affected by fetal growth restriction [O36.5990] Intrauterine pregnancy: [redacted]w[redacted]d    Secondary diagnosis:  Principal Problem:   Abnormal ultrasound Active Problems:   Morbid [place] ([place]   History of cesarean section   Type 2 diabetes mellitus without complication ([place]   NSTEMI (non-ST elevated myocardial infarction) ([place]   Hyperlipidemia associated with type 2 diabetes mellitus ([place]   Cardiomyopathy ([place]   Status post coronary artery stent placement   History of herpes genitalis   Supervision of high risk pregnancy, antepartum   Proteinuria affecting pregnancy, antepartum   Acute on chronic diastolic CHF (congestive heart failure) ([place]   Type 2 diabetes mellitus in pregnancy, unspecified trimester   Hypertension in pregnancy, antepartum   Pregnancy affected by fetal growth restriction   Poor glycemic control   AKI (acute kidney injury) ([facility]     Discharge diagnosis: s/p rpt c-section and BTL with Prevena placement                                              Post partum procedures:None Augmentation: N/A Complications: None  Hospital course: Patient admitted on 9/24 at 33/4 for FGR with abnormal dopplers and MFM recommendation for steroids and delivery at 34 weeks. Prior to delivery, her CBGs and BPs were optimally titrated and she underwent her scheduled c-section and BTL and prevena placement at 34/0. Postoperatively, she had a routine course and was meeting all post op goals. She did receive a dose of IV feraheme.   Cardiac-wise, she did well and was followed by Cardiology and her BP medications adjusted accordingly.   Magnesium Sulfate received: No BMZ received: Yes Rhophylac:N/A MMR:No T-DaP:Given Flu:  No Transfusion:No  Physical exam  Vitals:   06/01/20 0025 06/01/20 0430 06/01/20 0822 06/01/20 1309  BP: (!) 130/59 122/65 104/64 (!) 102/53  Pulse: 94 92 84 91  Resp: _0 Temp: 98.1 F (36.7 C) 98.6 F (37 C) 97.7 F (36.5 C) 98 F (36.7 C)  TempSrc: Oral Oral Oral Oral  SpO2:  98% 100% 100%  Weight:      Height:       General: alert Lochia: appropriate Uterine Fundus: firm Incision: Prevena dressing c/d/i and functioning DVT Evaluation: No evidence of DVT seen on physical exam. Labs: CBC Latest Ref Rng & Units 06/01/2020 05/31/2020 05/30/2020  WBC 4.0 - 10.5 K/uL 10.1 11.2(H) 13.5(H)  Hemoglobin 12.0 - 15.0 g/dL 7.8(L) 8.0(L) 9.1(L)  Hematocrit 36 - 46 % 23.9(L) 24.4(L) 27.1(L)  Platelets 150 - 400 K/uL 254 272 306    CMP Latest Ref Rng & Units 06/01/2020  Glucose 70 - 99 mg/dL 136(H)  BUN 6 - 20 mg/dL 21(H)  Creatinine 0.44 - 1.00 mg/dL 0.94  Sodium 135 - 145 mmol/L 138  Potassium 3.5 - 5.1 mmol/L 3.6  Chloride 98 - 111 mmol/L 106  CO2 22 - 32 mmol/L 24  Calcium 8.9 - 10.3 mg/dL 8.8(L)  Total Protein 6.5 - 8.1 g/dL -  Total Bilirubin 0.3 - 1.2 mg/dL -  Alkaline Phos 38 - 126 U/L -  AST 15 - 41 U/L -  ALT 0 - 44  U/L -   Edinburgh Score: No flowsheet data found.    After visit meds:  Allergies as of 06/01/2020   No Known Allergies     Medication List    STOP taking these medications   cefadroxil 500 MG capsule Commonly known as: DURICEF   hydrALAZINE 25 MG tablet Commonly known as: APRESOLINE   isosorbide mononitrate 60 MG 24 hr tablet Commonly known as: IMDUR   potassium chloride 20 MEQ/15ML (10%) Soln   valACYclovir 500 MG tablet Commonly known as: Valtrex     TAKE these medications   aspirin 81 MG chewable tablet Chew 1 tablet (81 mg total) by mouth daily.   benazepril 10 MG tablet Commonly known as: LOTENSIN Take 1 tablet (10 mg total) by mouth daily. Start taking on: June 02, 2020   cetirizine 10 MG tablet Commonly  known as: ZyrTEC Allergy Take 1 tablet (10 mg total) by mouth daily. What changed:   when to take this  reasons to take this   furosemide 40 MG tablet Commonly known as: LASIX Take 1 tablet (40 mg total) by mouth daily. Start taking on: June 02, 2020 What changed: when to take this   insulin aspart 100 UNIT/ML injection Commonly known as: NovoLOG Inject 8 Units into the skin 3 (three) times daily with meals. What changed: how much to take   insulin glargine 100 UNIT/ML injection Commonly known as: Lantus Inject 0.17 mLs (17 Units total) into the skin at bedtime. What changed: how much to take   labetalol 200 MG tablet Commonly known as: NORMODYNE Take 1 tablet (200 mg total) by mouth every 8 (eight) hours. What changed: when to take this   NIFEdipine 60 MG 24 hr tablet Commonly known as: PROCARDIA XL/NIFEDICAL XL Take 1 tablet (60 mg total) by mouth at bedtime.   nitroGLYCERIN 0.4 MG SL tablet Commonly known as: NITROSTAT Place 1 tablet (0.4 mg total) under the tongue every 5 (five) minutes as needed for chest pain.   ondansetron 4 MG tablet Commonly known as: ZOFRAN Take 1 tablet (4 mg total) by mouth every 8 (eight) hours as needed for nausea or vomiting. What changed:   when to take this  reasons to take this   oxyCODONE-acetaminophen 5-325 MG tablet Commonly known as: PERCOCET/ROXICET Take 1-2 tablets by mouth every 6 (six) hours as needed.   pantoprazole 20 MG tablet Commonly known as: PROTONIX Take 20 mg by mouth 2 (two) times daily.   potassium chloride SA 20 MEQ tablet Commonly known as: KLOR-CON Take 1 tablet (20 mEq total) by mouth daily.   prenatal multivitamin Tabs tablet Take 1 tablet by mouth daily at 12 noon.   simethicone 80 MG chewable tablet Commonly known as: MYLICON Chew 1 tablet (80 mg total) by mouth 4 (four) times daily as needed for flatulence.        Discharge home in stable condition Infant Feeding: Breast Infant  Disposition:NICU Discharge instruction: per After Visit Summary and Postpartum booklet. Activity: Advance as tolerated. Pelvic rest for 6 weeks.  Diet: carb modified diet  Anticipated Birth Control: s/p BTL Postpartum Appointment:1 week Additional Postpartum F/U: pt to follow up with cardiology Future Appointments: Future Appointments  Date Time Provider [place]  06/16/2020  3:30 PM [facility] PA/NP [facility] None  09/27/2020 10:00 AM [name], Nadean Corwin, MD [facility]   Follow up Visit:  Follow-up Information    [facility] Follow up on 06/16/2020.   Specialty: Cardiology  Why: at 3:30 Contact information: [address] [id number] [place] [facility] [place] [facility]. Go in 1 week(s).   Specialty: Obstetrics and Gynecology Why: blood pressure, prevena removal and lab check  Contact information: [address] [place] [address] [phone]                  06/01/2020 Aletha Halim, MD

## 2020-06-01 NOTE — Lactation Note (Signed)
This note was copied from a baby's chart. Lactation Consultation Note  Patient Name: Lauren Delgado Today's Date: 06/01/2020   [place] attempted to visit mom but she was in NICU. Lactation will revisit.  Maternal Data    Feeding    LATCH Score                   Interventions    Lactation Tools Discussed/Used     Consult Status      Ferne Coe [facility] 06/01/2020, 12:40 PM

## 2020-06-01 NOTE — Lactation Note (Signed)
This note was copied from a baby's chart. Lactation Consultation Note  Patient Name: Lauren Delgado [id number] Date: 06/01/2020 Reason for consult: Follow-up assessment;NICU baby  Mom resting in bed.  She states she is going to pump after sleeping.  She doesn't have any discomfort with the flanges/pump.  She denies questions regarding the pump.    Mom told the [facility] that she did not get any out when she last pumped.  LC reviewed importance of consistently pumping in addition to hand expressing and the milk she is able to collect will increase.    [facility] congratulated her on the birth of her son "Lauren Delgado".  Mom understands she can call out for assistance or questions regarding pumping if needed.     Maternal Data    Feeding    LATCH Score                   Interventions    Lactation Tools Discussed/Used Tools: Pump   Consult Status Consult Status: Follow-up Date: 06/02/20 Follow-up type: In-patient    Ferne Coe [facility] 06/01/2020, 2:09 PM

## 2020-06-01 NOTE — Lactation Note (Signed)
This note was copied from a baby's chart. Lactation Consultation Note  Patient Name: Lauren Delgado [id number] Date: 06/01/2020     Mom being [name] this evening.  Infant in NICU.  RN provided mom with a manual breastpump.  Mom has an appt. With [facility] at 10 am tomorrow morning.   Maternal Data    Feeding    LATCH Score                   Interventions    Lactation Tools Discussed/Used     Consult Status      Ferne Coe [facility] 06/01/2020, 6:26 PM

## 2020-06-01 NOTE — Progress Notes (Signed)
Inpatient Diabetes Program Recommendations  AACE/ADA: New Consensus Statement on Inpatient Glycemic Control (2015)  Target Ranges:  Prepandial:   less than 140 mg/dL      Peak postprandial:   less than 180 mg/dL (1-2 hours)      Critically ill patients:  140 - 180 mg/dL   Lab Results  Component Value Date   GLUCAP 135 (H) 05/31/2020   HGBA1C 7.7 (H) 05/07/2020    Review of Glycemic Control Results for Lauren Delgado, Lauren Delgado (MRN [id number]) as of 06/01/2020 08:52  Ref. Range 05/31/2020 08:32 05/31/2020 13:02 05/31/2020 17:37 05/31/2020 23:38  Glucose-Capillary Latest Ref Range: 70 - 99 mg/dL 146 (H) 128 (H) 161 (H) 135 (H)    Diabetes history:  DM2 Outpatient diabetes medications during pregnancy: Lantus 30 units qhs Novolog 10 units tid with meals Current orders for Inpatient glycemic control:  Lantus 12 units daily Novolog 0-16 units tid after meals Novolog 6 units tid with meals  Inpatient Diabetes Program Recommendations:     If to remain inpatient, consider increasing Lantus to 15 units QD and changing correction to Novolog 0-15 units TID & HS (in order to be with meals).   Thanks, Bronson Curb, MSN, RNC-OB Diabetes Coordinator [phone] (8a-5p)

## 2020-06-01 NOTE — Progress Notes (Signed)
  Echocardiogram 2D Echocardiogram has been performed.  Lauren Delgado 06/01/2020, 9:53 AM

## 2020-06-01 NOTE — Progress Notes (Signed)
Daily Antepartum Note  Admission Date: 05/27/2020 Current Date: 06/01/2020 10:29 AM  Lauren Delgado is a 34 y.o. [id number] HD#6/POD#2 rpt c-section and BTL @ 34wks for fetal growth restriction. Patient admitted for fetal growth restriction  Pregnancy complicated by: Patient Active Problem List   Diagnosis Date Noted  . AKI (acute kidney injury) ([place]) 05/31/2020  . Abnormal ultrasound 05/27/2020  . Urinary tract infection affecting care of mother in third trimester, antepartum 05/10/2020  . Poor glycemic control 05/07/2020  . Pregnancy affected by fetal growth restriction 04/07/2020  . Leg swelling 02/12/2020  . Anemia 02/12/2020  . 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 Type 2 diabetes mellitus in pregnancy, unspecified trimester 01/13/2020  . Hypertension in pregnancy, antepartum 01/13/2020  . Nausea and vomiting in pregnancy 01/13/2020  . Cough 01/12/2020  . Cardiac disease in mother affecting pregnancy in first trimester 12/02/2019  . Acute on chronic diastolic CHF (congestive heart failure) ([place]) 11/26/2019  . Hypertensive crisis 11/26/2019  . Hyperglycemia due to type 2 diabetes mellitus ([place]) 11/24/2019  . Proteinuria affecting pregnancy, antepartum 11/20/2019  . Supervision of high risk pregnancy, antepartum 11/18/2019  . History of herpes genitalis 11/16/2019  . Sleep apnea 07/02/2019  . Healthcare maintenance 02/12/2019  . Coronary artery disease 12/20/2017  . Hypertension 12/20/2017  . Status post coronary artery stent placement   . Cardiomyopathy ([place]) 11/03/2017  . Hyperlipidemia associated with type 2 diabetes mellitus ([place]) 11/02/2017  . Abdominal obesity and metabolic syndrome 16/60/6301  . NSTEMI (non-ST elevated myocardial infarction) ([place]) 11/01/2017  . ACS (acute coronary syndrome) ([place]) 11/01/2017  . Type 2 diabetes mellitus without complication ([place]) 60/06/9322  . History of cesarean section 09/11/2014  . Polycystic ovaries 10/31/2006  . Morbid obesity  ([place]) 10/31/2006    Overnight/24hr events:  Seen by cardiology yesterday IV feraheme #1 yesterday  Subjective:  No chest pain, sob, flatus, nausea, vomiting. Normal lochia. Patient getting her echo in room  Objective:    Current Vital Signs 24h Vital Sign Ranges  T 97.7 F (36.5 C) Temp  Avg: 98.1 F (36.7 C)  Min: 97.7 F (36.5 C)  Max: 98.6 F (37 C)  BP 104/64 BP  Min: 104/64  Max: 130/59  HR 84 Pulse  Avg: 90.6  Min: 84  Max: 94  RR 20 Resp  Avg: 18.2  Min: 17  Max: 20  SaO2 100 % Room Air SpO2  Avg: 98.8 %  Min: 98 %  Max: 100 %       24 Hour I/O Current Shift I/O  Time Ins Outs 09/28 0701 - 09/29 0700 In: 0  Out: 2900 [Urine:2900] No intake/output data recorded.    Intake/Output Summary (Last 24 hours) at 06/01/2020 1029 Last data filed at 06/01/2020 0435 Gross per 24 hour  Intake --  Output 2800 ml  Net -2800 ml   Patient Vitals for the past 24 hrs:  BP Temp Temp src Pulse Resp SpO2  06/01/20 0822 104/64 97.7 F (36.5 C) Oral 84 20 100 %  06/01/20 0430 122/65 98.6 F (37 C) Oral 92 18 98 %  06/01/20 0025 (!) 130/59 98.1 F (36.7 C) Oral 94 18 --  05/31/20 2205 130/65 98 F (36.7 C) Oral 91 18 99 %  05/31/20 1633 (!) 109/47 98.2 F (36.8 C) Oral 92 17 98 %    Physical exam: General: Well nourished, well developed female in no acute distress. Abdomen: obese, prevena dressing functioning correctly.  nttp Respiratory: no respiratory distress Skin: Warm and  dry.   Medications: Current Facility-Administered Medications  Medication Dose Route Frequency Provider Last Rate Last Admin  . 0.9 %  sodium chloride infusion  250 mL Intravenous PRN Aletha Halim, MD      . acetaminophen (TYLENOL) tablet 650 mg  650 mg Oral Q6H Aletha Halim, MD   650 mg at 06/01/20 4680  . aspirin chewable tablet 81 mg  81 mg Oral QHS Aletha Halim, MD   81 mg at 05/31/20 2221  . benazepril (LOTENSIN) tablet 10 mg  10 mg Oral Daily Larey Dresser, MD   10 mg at  05/31/20 1404  . calcium carbonate (TUMS - dosed in mg elemental calcium) chewable tablet 400 mg of elemental calcium  2 tablet Oral Q4H PRN Aletha Halim, MD      . coconut oil  1 application Topical PRN Aletha Halim, MD      . witch hazel-glycerin (TUCKS) pad 1 application  1 application Topical PRN Aletha Halim, MD       And  . dibucaine (NUPERCAINAL) 1 % rectal ointment 1 application  1 application Rectal PRN Aletha Halim, MD      . diphenhydrAMINE (BENADRYL) injection 12.5 mg  12.5 mg Intravenous Q4H PRN Lynda Rainwater, MD       Or  . diphenhydrAMINE (BENADRYL) capsule 25 mg  25 mg Oral Q4H PRN Lynda Rainwater, MD      . diphenhydrAMINE (BENADRYL) capsule 25 mg  25 mg Oral Q6H PRN Aletha Halim, MD   25 mg at 05/30/20 1645  . enoxaparin (LOVENOX) injection 60 mg  60 mg Subcutaneous Q24H Aletha Halim, MD   60 mg at 06/01/20 0813  . furosemide (LASIX) tablet 40 mg  40 mg Oral BID Aletha Halim, MD   40 mg at 06/01/20 0813  . insulin aspart (novoLOG) injection 0-16 Units  0-16 Units Subcutaneous TID PC Aletha Halim, MD   3 Units at 06/01/20 0020  . insulin aspart (novoLOG) injection 6 Units  6 Units Subcutaneous TID WC Aletha Halim, MD   6 Units at 05/31/20 2123  . insulin glargine (LANTUS) injection 12 Units  12 Units Subcutaneous QHS Aletha Halim, MD   12 Units at 06/01/20 0021  . labetalol (NORMODYNE) tablet 200 mg  200 mg Oral Q8H Aletha Halim, MD   200 mg at 06/01/20 3212  . menthol-cetylpyridinium (CEPACOL) lozenge 3 mg  1 lozenge Oral Q2H PRN Aletha Halim, MD      . nalbuphine (NUBAIN) injection 5 mg  5 mg Intravenous Q4H PRN Lynda Rainwater, MD   5 mg at 05/30/20 1857   Or  . nalbuphine (NUBAIN) injection 5 mg  5 mg Subcutaneous Q4H PRN Lynda Rainwater, MD      . nalbuphine (NUBAIN) injection 5 mg  5 mg Intravenous Once PRN Lynda Rainwater, MD       Or  . nalbuphine (NUBAIN) injection 5 mg  5 mg Subcutaneous Once PRN Lynda Rainwater, MD      . naloxone [facility]) injection 0.4 mg  0.4 mg Intravenous PRN Lynda Rainwater, MD       And  . sodium chloride flush (NS) 0.9 % injection 3 mL  3 mL Intravenous PRN Lynda Rainwater, MD      . naloxone HCl [facility]) 2 mg in dextrose 5 % 250 mL infusion  1-4 mcg/kg/hr Intravenous Continuous PRN Lynda Rainwater, MD      . NIFEdipine (PROCARDIA-XL/NIFEDICAL-XL) 24 hr tablet 60 mg  60 mg Oral QHS Aletha Halim, MD   60 mg at 05/31/20 2221  . ondansetron (ZOFRAN) injection 4 mg  4 mg Intravenous Q8H PRN Lynda Rainwater, MD      . oxyCODONE (Oxy IR/ROXICODONE) immediate release tablet 5-10 mg  5-10 mg Oral Q4H PRN Aletha Halim, MD   10 mg at 05/31/20 2221  . prenatal multivitamin tablet 1 tablet  1 tablet Oral Q1200 Aletha Halim, MD   1 tablet at 05/31/20 1221  . scopolamine (TRANSDERM-SCOP) 1 MG/3DAYS 1.5 mg  1 patch Transdermal Once Lynda Rainwater, MD      . senna-docusate (Senokot-S) tablet 2 tablet  2 tablet Oral QHS PRN Aletha Halim, MD      . simethicone (MYLICON) chewable tablet 80 mg  80 mg Oral TID Ria Clock, MD   80 mg at 06/01/20 0813  . sodium chloride flush (NS) 0.9 % injection 3 mL  3 mL Intravenous PRN Aletha Halim, MD      . Tdap (BOOSTRIX) injection 0.5 mL  0.5 mL Intramuscular Once Aletha Halim, MD        Labs:  Recent Labs  Lab 05/30/20 1801 05/31/20 0555 06/01/20 0537  WBC 13.5* 11.2* 10.1  HGB 9.1* 8.0* 7.8*  HCT 27.1* 24.4* 23.9*  PLT 306 272 254    Recent Labs  Lab 05/27/20 1309 05/28/20 0448 05/30/20 0525 05/30/20 0525 05/30/20 1801 05/31/20 0959 06/01/20 0537  NA 135   < > 134*   < > 135 135 138  K 4.0   < > 3.4*   < > 3.7 3.8 3.6  CL 104   < > 103   < > 104 104 106  CO2 21*   < > 20*   < > 22 22 24   BUN 18   < > 19   < > 21* 26* 21*  CREATININE 1.08*   < > 0.90   < > 1.03* 1.10* 0.94  CALCIUM 8.8*   < > 8.7*   < > 8.8* 8.7* 8.8*  PROT 6.5  --  5.8*  --   --   --   --   BILITOT 0.5  --   0.2*  --   --   --   --   ALKPHOS 81  --  78  --   --   --   --   ALT 16  --  17  --   --   --   --   AST 21  --  15  --   --   --   --   GLUCOSE 192*   < > 191*   < > 96 135* 136*   < > = values in this interval not displayed.   Results for JOLETTA, MANNER (MRN [id number]) as of 06/01/2020 10:29  Ref. Range 05/31/2020 08:32 05/31/2020 09:59 05/31/2020 13:02 05/31/2020 17:37 05/31/2020 23:38  Glucose-Capillary Latest Ref Range: 70 - 99 mg/dL 146 (H)  128 (H) 161 (H) 135 (H)   Radiology:  No new imaging  Assessment & Plan:  Pt stable *Postpartum: B POS. Breast feeding. BTL *CV: Cardiology following. Follow up echo results. I asked her to see if she could keep her Friday already scheduled (made before she was admitted) cardiology appt and pt is unsure. I told her if she can't to call them asap to reschedule.  *Renal: no issues *DM2: increase lantus to 15 qhs *Anemia: asymptomatic.  -IV feraheme #1 on 9/29 *PPx: lovenox,  oob ad lib *FEN/GI: regular diet *Dispo: likely pod#3  Durene Romans MD [facility]ending [facility] [place] [facility] GYN Consult Phone: [phone] (M-F, 0800-1700) & [phone] (Off hours, weekends, holidays)

## 2020-06-01 NOTE — Discharge Instructions (Signed)
Hypertension During Pregnancy Hypertension is also called high blood pressure. High blood pressure means that the force of your blood moving in your body is too strong. It can cause problems for you and your baby. Different types of high blood pressure can happen during pregnancy. The types are:  High blood pressure before you got pregnant. This is called chronic hypertension.  This can continue during your pregnancy. Your doctor will want to keep checking your blood pressure. You may need medicine to keep your blood pressure under control while you are pregnant. You will need follow-up visits after you have your baby.  High blood pressure that goes up during pregnancy when it was normal before. This is called gestational hypertension. It will usually get better after you have your baby, but your doctor will need to watch your blood pressure to make sure that it is getting better.  Very high blood pressure during pregnancy. This is called preeclampsia. Very high blood pressure is an emergency that needs to be checked and treated right away.  You may develop very high blood pressure after giving birth. This is called postpartum preeclampsia. This usually occurs within 48 hours after childbirth but may occur up to 6 weeks after giving birth. This is rare. How does this affect me? If you have high blood pressure during pregnancy, you have a higher chance of developing high blood pressure:  As you get older.  If you get pregnant again. In some cases, high blood pressure during pregnancy can cause:  Stroke.  Heart attack.  Damage to the kidneys, lungs, or liver.  Preeclampsia.  Jerky movements you cannot control (convulsions or seizures).  Problems with the placenta.   What can I do to lower my risk?   Keep a healthy weight.  Eat a healthy diet.  Follow what your doctor tells you about treating any medical problems that you had before becoming pregnant. It is very important to go to  all of your doctor visits. Your doctor will check your blood pressure and make sure that your pregnancy is progressing as it should. Treatment should start early if a problem is found.   Follow these instructions at home:  Take your blood pressure 1-2 times per day. Call the office if your blood pressure is 155 or higher for the top number or 105 or higher for the bottom number.    Eating and drinking   Drink enough fluid to keep your pee (urine) pale yellow.  Avoid caffeine. Lifestyle  Do not use any products that contain nicotine or tobacco, such as cigarettes, e-cigarettes, and chewing tobacco. If you need help quitting, ask your doctor.  Do not use alcohol or drugs.  Avoid stress.  Rest and get plenty of sleep.  Regular exercise can help. Ask your doctor what kinds of exercise are best for you. General instructions  Take over-the-counter and prescription medicines only as told by your doctor.  Keep all prenatal and follow-up visits as told by your doctor. This is important. Contact a doctor if:  You have symptoms that your doctor told you to watch for, such as: ? Headaches. ? Nausea. ? Vomiting. ? Belly (abdominal) pain. ? Dizziness. ? Light-headedness. Get help right away if:  You have: ? Very bad belly pain that does not get better with treatment. ? A very bad headache that does not get better. ? Vomiting that does not get better. ? Sudden, fast weight gain. ? Sudden swelling in your hands, ankles, or face. ?  moving in your body is too strong. High blood pressure can cause problems for you and your baby. Keep all follow-up visits as told by your doctor. This is important. This information is not intended to replace advice given to you by your health care provider. Make sure you discuss any  questions you have with your health care provider. Document Released: 09/22/2010 Document Revised: 12/11/2018 Document Reviewed: 09/16/2018 Elsevier Patient Education  2020 Elsevier Inc.    Cesarean Delivery, Care After Refer to this sheet in the next few weeks. These instructions provide you with information on caring for yourself after your procedure. Your health care provider may also give you specific instructions. Your treatment has been planned according to current medical practices, but problems sometimes occur. Call your health care provider if you have any problems or questions after you go home. HOME CARE INSTRUCTIONS   Only take over-the-counter or prescription medications as directed by your health care provider. Do not drink alcohol, especially if you are breastfeeding or taking medication to relieve pain. Do not  smoke tobacco. Continue to use good perineal care. Good perineal care includes: Wiping your perineum from front to back. Keeping your perineum clean. Check your surgical cut (incision) daily for increased redness, drainage, swelling, or separation of skin. Shower and clean your incision gently with soap and water every day, by letting warm and soapy water run over the incision, and then pat it dry. If your health care provider says it is okay, leave the incision uncovered. Use a bandage (dressing) if the incision is draining fluid or appears irritated. If the adhesive strips across the incision do not fall off within 7 days, carefully peel them off, after a shower. Hug a pillow when coughing or sneezing until your incision is healed. This helps to relieve pain. Do not use tampons, douches or have sexual intercourse, until your health care provider says it is okay. Wear a well-fitting bra that provides breast support. Limit wearing support panties or control-top hose. Drink enough fluids to keep your urine clear or pale yellow. Eat high-fiber foods such as whole grain  cereals and breads, brown rice, beans, and fresh fruits and vegetables every day. These foods may help prevent or relieve constipation. Resume activities such as climbing stairs, driving, lifting, exercising, or traveling as directed by your health care provider. Try to have someone help you with your household activities and your newborn for at least a few days after you leave the hospital. Rest as much as possible. Try to rest or take a nap when your newborn is sleeping. Increase your activities gradually. Do not lift more than 15lbs until directed by a provider. Keep all of your scheduled postpartum appointments. It is very important to keep your scheduled follow-up appointments. At these appointments, your health care provider will be checking to make sure that you are healing physically and emotionally. SEEK MEDICAL CARE IF:  You are passing large clots from your vagina. Save any clots to show your health care provider. You have a foul smelling discharge from your vagina. You have trouble urinating. You are urinating frequently. You have pain when you urinate. You have a change in your bowel movements. You have increasing redness, pain, or swelling near your incision. You have pus draining from your incision. Your incision is separating. You have painful, hard, or reddened breasts. You have a severe headache. You have blurred vision or see spots. You feel sad or depressed. You have thoughts of hurting yourself or your   newborn. You have questions about your care, the care of your newborn, or medications. You are dizzy or light-headed. You have a rash. You have pain, redness, or swelling at the site of the removed intravenous access (IV) tube. You have nausea or vomiting. You stopped breastfeeding and have not had a menstrual period within 12 weeks of stopping. You are not breastfeeding and have not had a menstrual period within 12 weeks of delivery. You have a fever. SEEK IMMEDIATE  MEDICAL CARE IF: You have persistent pain. You have chest pain. You have shortness of breath. You faint. You have leg pain. You have stomach pain. Your vaginal bleeding saturates 2 or more sanitary pads in 1 hour. MAKE SURE YOU:  Understand these instructions. Will watch your condition. Will get help right away if you are not doing well or get worse. Document Released: 05/12/2002 Document Revised: 01/04/2014 Document Reviewed: 04/16/2012 ExitCare Patient Information 2015 [employer]. This information is not intended to replace advice given to you by your health care provider. Make sure you discuss any questions you have with your health care provider.  

## 2020-06-02 ENCOUNTER — Telehealth: Payer: Self-pay | Admitting: *Deleted

## 2020-06-02 NOTE — Telephone Encounter (Signed)
Transition Care Management Follow-up Telephone Call  Date of discharge and from where: 06/01/20 [facility]  How have you been since you were released from the hospital? "Still having a little pain."  Any questions or concerns? No  Items Reviewed:  Did the pt receive and understand the discharge instructions provided? Yes   Medications obtained and verified? Yes   Any new allergies since your discharge? No   Dietary orders reviewed? Yes  Do you have support at home? Yes   Functional Questionnaire: (I = Independent and D = Dependent) ADLs: I  Bathing/Dressing- I  Meal Prep- I  Eating- I  Maintaining continence- I  Transferring/Ambulation- I  Managing Meds- I  Follow up appointments reviewed:   PCP Hospital f/u appt confirmed? Yes  Scheduled to see Dr. Higinio Plan on 06/08/20 @ 1450.  [facility] f/u appt confirmed? No    Are transportation arrangements needed? No   If their condition worsens, is the pt aware to call PCP or go to the Emergency Dept.? Yes  Was the patient provided with contact information for the PCP's office or ED? Yes  Was to pt encouraged to call back with questions or concerns? Yes

## 2020-06-02 NOTE — Clinical Social Work Maternal (Signed)
CLINICAL SOCIAL WORK MATERNAL/CHILD NOTE  Patient Details  Name: Lauren Delgado MRN: [id number] Date of Birth: 1986/06/26  Date:  06/02/2020  Clinical Social Worker Initiating Note:  Lauren Delgado, [place] Date/Time: Initiated:  06/01/20/1500     Child's Name:  Lauren Delgado   Biological Parents:  Mother   Need for Interpreter:  None   Reason for Referral:  Other (Comment) (Infant's NICU Admission)   Address:  [address] [place] [address]    Phone number:  [phone] (home)     Additional phone number:   Household Members/Support Persons (HM/SP):   Household Member/Support Person 1, Household Member/Support Person 2   HM/SP Name Relationship DOB or Age  HM/SP -1   mom    HM/SP -2 Lauren Delgado son 09/09/14  HM/SP -3        HM/SP -4        HM/SP -5        HM/SP -6        HM/SP -7        HM/SP -8          Natural Supports (not living in the home):      Professional Supports: Other (Comment) (Was seen by [facility] specialist twice during pregnancy)   Employment: Unemployed   Type of Work:     Education:  9 to 11 years (11th Grade)   Homebound arranged: No  Museum/gallery curator Resources:  [employer]   Other Resources:  [employer], Physicist, medical    Cultural/Religious Considerations Which May Impact Care:    Strengths:  Ability to meet basic needs , Engineer, materials, Understanding of illness   Psychotropic Medications:         Pediatrician:    [place]  Pediatrician List:   [place]  [place]      Pediatrician Fax Number:    Risk Factors/Current Problems:  None   Cognitive State:  Able to Concentrate , Alert , Linear Thinking , Goal Oriented    Mood/Affect:  Calm , Interested , Apprehensive    CSW Assessment: CSW met with [name] at bedside to discuss infant's NICU admission. CSW introduced self and explained reason for visit. [name] was  calm and remained engaged during assessment. [name] reported that she resides with her mom and older son. [name] reported that she receives both [facility] and food stamps. [name] reported that she has started to shop for infant and has a car seat and basinet. CSW informed [name] about [employer] if any assistance is needed obtaining items for infant. [name] verbalized understanding. CSW inquired about [name]'s support system, [name] reported that her mom is a support.   CSW inquired about [name]'s mental health history, [name] denied any mental health history. [name] denied any postpartum depression with her older son. CSW inquired about how [name] was feeling emotionally after giving birth, [name] reported that she was feeling okay. [name] presented calm and did not demonstrate any acute mental health signs/symptoms. CSW assessed for safety, [name] denied SI, HI and domestic violence. CSW assessed for stressors, [name] reported none.   CSW provided education regarding the baby blues period vs. perinatal mood disorders, discussed treatment and gave resources for mental health follow up if concerns arise.  CSW recommends self-evaluation during the postpartum time period using the New Mom Checklist from Postpartum Progress and encouraged [name] to contact  a medical professional if symptoms are noted at any time.    CSW provided review of Sudden Infant Death Syndrome (SIDS) precautions.    CSW and [name] discussed infant's NICU admission. [name] reported that she is feeling good about infant's NICU admission. CSW informed [name] about the NICU, what to expect and resources/supports are available while is infant admitted to the NICU. [name] denied any transportation barriers with visiting infant in the NICU. [name] denied any questions/concerns regarding the NICU.   CSW will continue to offer resources/supports while infant is admitted to the NICU.    CSW Plan/Description:  Sudden Infant Death Syndrome (SIDS) Education, Perinatal Mood and  Anxiety Disorder (PMADs) Education, Other Patient/Family Education    Lauren Medin, LCSW 06/02/2020, 10:36 AM

## 2020-06-03 ENCOUNTER — Encounter (HOSPITAL_COMMUNITY): Payer: Medicaid Other | Admitting: Internal Medicine

## 2020-06-06 ENCOUNTER — Ambulatory Visit: Payer: Self-pay

## 2020-06-06 NOTE — Lactation Note (Signed)
This note was copied from a baby's chart. Lactation Consultation Note  Patient Name: Lauren Delgado HIDUP'B Date: 06/06/2020   Staff nurse phoned [facility] and ask if mother could have consult at 63;00 Today. Due to conflict with another patient suggested that 5 pm today would be a better time. Scheduled 5 pm consult for latch assist for today.      Maternal Data    Feeding Feeding Type: Donor Breast Milk  LATCH Score                   Interventions    Lactation Tools Discussed/Used     Consult Status      [name] 06/06/2020, 1:52 PM

## 2020-06-06 NOTE — Lactation Note (Signed)
This note was copied from a baby's chart. Lactation Consultation Note  Patient Name: Lauren Delgado NSQZY'T Date: 06/06/2020 Reason for consult: Mother's request;Late-preterm 34-36.6wks;Infant < 6lbs;NICU baby P2, 75 day old ETI infant -1% weight loss. Per mom, she really wants to BF infant , she did not BF her 33 year old son due to latch difficulties. Mom has only been using the DEBP 5 times per day, ntot the recommended 8 times , nor did she bring her Medela pump kit with her, when visiting son in NICU, per mom she hasn't been pumping while staying the night with infant in NICU. LC reviewed hand expression and mom has good supply. LC entered room infant was cuing to BF, mom had expressed 37ms of colostrum out of left breast prior to latching infant, Mom used the football hold position, rubbing breast below infant's nose, bring infant to breast, infant latched with deep latch, swallows observed , infant sustained latch for 7 minutes before becoming tired. Afterwards Infant is being supplemented with 35 mls of human milk fortified with breast milk. LC praised mom for her breastfeeding efforts and explained infant had a good latch for this feeding.  LC gave mom hand pump, she was still pumping had about 20 mls in hand pump, her EBM will be offered at the next 3 hour feeding at (2000 pm).  Mom's current feeding plan: 1. Mom will BF infant first with for all  feeding for 24 hours and then infant will be supplement with human milk fortifier ,   Mom knows to ask RN for assistance tonight  with latching infant at the breast  if needed. 2. After latching infant at the breast  mom will use the  hand pump (only for today)  due to not bringing her DEBP attachments to the hospital with her.  3. Infant will be supplementing after BF with mom's EBM that she hand pumped or hand express. 4. Going forward mom will start using her DEBP every 3 hours for 15 minutes 8 times per day instead of 5 times per day to  help establish her milk supply with hand expression. 539 Mom will start bringing her DEBP attachments to the hospital when visiting the infant or staying overnight and after feeding infant at the breast  she will pump in NICU so that infant will have additional volume of EBM. 680 Mom is in verbal agreement of infant feeding plan as advised by LC.    Maternal Data    Feeding Feeding Type: Donor Breast Milk  LATCH Score Latch: Grasps breast easily, tongue down, lips flanged, rhythmical sucking.  Audible Swallowing: Spontaneous and intermittent  Type of Nipple: Everted at rest and after stimulation  Comfort (Breast/Nipple): Soft / non-tender  Hold (Positioning): Assistance needed to correctly position infant at breast and maintain latch.  LATCH Score: 9  Interventions Interventions: Skin to skin;Hand express;Position options;Support pillows;Adjust position;Breast compression;Assisted with latch;Hand pump;Breast massage  Lactation Tools Discussed/Used     Consult Status Consult Status: Follow-up Date: 06/07/20 Follow-up type: In-patient    [name]10/12/2019, 5:51 PM

## 2020-06-07 ENCOUNTER — Ambulatory Visit (INDEPENDENT_AMBULATORY_CARE_PROVIDER_SITE_OTHER): Payer: Medicaid Other | Admitting: *Deleted

## 2020-06-07 ENCOUNTER — Other Ambulatory Visit: Payer: Self-pay

## 2020-06-07 ENCOUNTER — Encounter: Payer: Self-pay | Admitting: *Deleted

## 2020-06-07 VITALS — BP 184/95 | HR 92 | Ht 62.0 in | Wt 272.4 lb

## 2020-06-07 DIAGNOSIS — Z0130 Encounter for examination of blood pressure without abnormal findings: Secondary | ICD-10-CM

## 2020-06-07 DIAGNOSIS — I10 Essential (primary) hypertension: Secondary | ICD-10-CM

## 2020-06-07 DIAGNOSIS — Z4889 Encounter for other specified surgical aftercare: Secondary | ICD-10-CM

## 2020-06-07 MED ORDER — BENAZEPRIL HCL 10 MG PO TABS: 20.0000 mg | ORAL_TABLET | Freq: Every day | ORAL | 1 refills | Status: DC

## 2020-06-07 NOTE — Progress Notes (Signed)
Pt seen earlier today as scheduled for BP check and incision check s/p C/S on 9/27. BP - 184/95, P - 92  Pt denies H/A or visual disturbances. She did not take BP meds yet today and has not been checking BP @ home due to spending a lot of time in the hospital with her baby that is still in NICU. Prevena dressing was removed and incision was found to be mostly well healed. There was one small area on Rt side of incision approximately 1.5 inches in length where the skin edges were not completely approximated. No drainage, bleeding, redness or swelling was noted. Dr. Roselie Awkward was informed of BP and also came in to assess incision. Benazepril was increased to 20 mg once daily. Pt was instructed to check BP once daily and record results to [facility] app. No treatment required for incision. Pt was advised of proper cleansing for the incision and to keep it dry. Pt to return for BP check in one week if she is not able to check BP @ home and record to [facility]. Otherwise, she may follow up @ PP appt to be scheduled. Pt was offered [facility] appt due to elevated PHQ-9 and GAD 7 scores and she declined. She voiced understanding of all information and instructions given.

## 2020-06-08 ENCOUNTER — Ambulatory Visit: Payer: Medicaid Other | Admitting: Family Medicine

## 2020-06-08 ENCOUNTER — Ambulatory Visit: Payer: Medicaid Other

## 2020-06-08 ENCOUNTER — Other Ambulatory Visit: Payer: Medicaid Other

## 2020-06-08 NOTE — Progress Notes (Deleted)
    SUBJECTIVE:   CHIEF COMPLAINT / HPI:   ***  PERTINENT  PMH / PSH: ***  OBJECTIVE:   LMP 10/05/2019 (Exact Date)   ***  ASSESSMENT/PLAN:   No problem-specific Assessment & Plan notes found for this encounter.     Lauren Delgado, [place]

## 2020-06-10 ENCOUNTER — Ambulatory Visit: Payer: Self-pay

## 2020-06-10 ENCOUNTER — Telehealth (INDEPENDENT_AMBULATORY_CARE_PROVIDER_SITE_OTHER): Payer: Medicaid Other

## 2020-06-10 DIAGNOSIS — I10 Essential (primary) hypertension: Secondary | ICD-10-CM

## 2020-06-10 NOTE — Telephone Encounter (Signed)
Received notification from Babyscripts that pt documented 149/72 with no sx's.  Per chart review, pt was seen in office on 06/07/20 and Benazepril dosage was increased and pt advised to continue to record BP via Babyscripts.  Will continue to monitor BP's.    Mel Almond, RN  06/10/20

## 2020-06-10 NOTE — Lactation Note (Signed)
This note was copied from a baby's chart. Lactation Consultation Note  Patient Name: Lauren Delgado HYIFO'Y Date: 06/10/2020 Reason for consult: Follow-up assessment;NICU baby;Infant < 6lbs;Late-preterm 34-36.6wks  LC in to speak with P2 Mom of 19 day old baby in the NICU.  Baby is taking 28% po and gavage feeding remainder.   Noted that baby hasn't had mother's own milk since 10/4.    Mom sitting in chair with baby swaddled in her arms.  Baby had just taken 13 ml 24 cal formula by bottle and remainder of feeding by gavage.    Asked Mom how her pumping is going.  Mom prefers to use hand pump, pumping every 3hrs but didn't pump last night.  Mom states she expressed about 15 ml each time.  Mom has a DEBP ([place]) at home.  Talked about the benefits of using the double pump with regards to stimulating her milk supply.  Mom appeared surprised that using the DEBP was preferred.  Talked about benefits of STS with baby.  Mom asked about whether she would like to breastfeed, and she said yes, but no one has brought it up to her. Mom states baby went to breast once and latched.    Mom states she has EBM at home that she forgot to bring in as she is moving and left the milk in the refrigerator.  Mom aware that milk is safe in frig for 4 days.    Offered assistance with positioning and latching baby, and Mom states she is leaving to be with her 61 yr old son at home.  Mom would like lactation assistance tomorrow at 8 am feeding.  Encouraged use of DEBP every 2-3 hrs during the day and 3-4 hrs at night, pumping for 15-30 mins.    Encourage lots of STS.    Interventions Interventions: Breast feeding basics reviewed;Skin to skin;Breast massage;Hand express;Hand pump;DEBP  Lactation Tools Discussed/Used Tools: Pump;Bottle Breast pump type: Manual;Double-Electric Breast Pump   Consult Status Consult Status: Follow-up Date: 06/11/20 Follow-up type: In-patient    Lauren Delgado 06/10/2020,  8:51 AM

## 2020-06-11 ENCOUNTER — Ambulatory Visit: Payer: Self-pay

## 2020-06-11 NOTE — Lactation Note (Signed)
This note was copied from a baby's chart. Lactation Consultation Note  Patient Name: Lauren Delgado BHALP'F Date: 06/11/2020 Reason for consult: Follow-up assessment;Mother's request;NICU baby;Infant < 6lbs  LC in to meet with Mom for a breastfeeding assist.  RN stated that baby had already fed and Mom was showering.  [name] asked if she could ask Mom if she would like to speak to [facility] today.  RN stated that Mom has not been pumping or providing breastmilk.  Will follow-up with Mom prn.   Consult Status Consult Status: PRN Date: 06/11/20 Follow-up type: In-patient    [name] 06/11/2020, 10:33 AM

## 2020-06-15 ENCOUNTER — Encounter (HOSPITAL_COMMUNITY): Payer: Medicaid Other

## 2020-06-15 ENCOUNTER — Ambulatory Visit: Payer: Self-pay

## 2020-06-15 ENCOUNTER — Ambulatory Visit: Payer: Medicaid Other

## 2020-06-15 NOTE — Lactation Note (Signed)
This note was copied from a baby's chart. Lactation Consultation Note  Patient Name: Lauren Delgado KDPTE'L Date: 06/15/2020   Infant is 45 weeks 48 weeks old. LC went in to visit with Mom but she was at home. She will be in this evening at 9 pm. LC called and talk to Mom at home. Mom states pumping going well and she has her Medela pump parts from the NICU. She denies any signs of engorgement or nipple pain. I told her I put a pump in her room and she can bring in her pump parts to use in the hospital.   Infant is on a set schedule of 40 ml q 3 hours. I spoke with the nurse is last feeding was 32 ml of Similac special care and 7 ml down the NG tube.   Mom states she is aware how to transport her EBM safely from home as she lives only 20 minutes away.

## 2020-06-16 ENCOUNTER — Ambulatory Visit: Payer: Self-pay

## 2020-06-16 ENCOUNTER — Encounter (HOSPITAL_COMMUNITY): Payer: Medicaid Other

## 2020-06-16 ENCOUNTER — Other Ambulatory Visit: Payer: Self-pay | Admitting: Family Medicine

## 2020-06-16 NOTE — Lactation Note (Signed)
This note was copied from a baby's chart. Lactation Consultation Note  Patient Name: Lauren Delgado Today's Date: 06/16/2020   Mother is out of the hospital. [facility] will come back to room at another time as possible.     Feeding Feeding Type: Bottle Fed - Formula Nipple Type: Dr. Myra Gianotti Preemie  [name] 06/16/2020, 4:45 PM

## 2020-06-16 NOTE — Lactation Note (Signed)
This note was copied from a baby's chart. Lactation Consultation Note Called to Peds. D/t mom has cracked nipples and is in a lot of pain w/pumping so she isn't pumping or anything to that Lt. Breast. Encouraged hand expression to have stimulation. Mom stated she couldn't tolerate it. Comfort gels given. Asked mom to see the flanges. Mom has #27 flanges. Asked mom if she has any smaller flanges, mom stated no.  LC gave mom #24 flanges. She may could even use #21 but LC worried w/o seeing mom pump nipple and areola tissue will rub causing more damage. Encouraged mom to get coconut oil to apply.  Asked mom how often is she pumping, mom stated every 3 hrs. Praised mom. Encouraged to call for [facility] if not helping.  Patient Name: Lauren Delgado PEJYL'T Date: 06/16/2020 Reason for consult: Mother's request;Nipple pain/trauma;NICU baby;Late-preterm 34-36.6wks   Maternal Data    Feeding Feeding Type: Bottle Fed - Formula Nipple Type: Dr. Myra Gianotti Preemie  LATCH Score       Type of Nipple: Flat  Comfort (Breast/Nipple): Engorged, cracked, bleeding, large blisters, severe discomfort (Lt. nipple cracked around base of nipple.)        Interventions Interventions: Comfort gels  Lactation Tools Discussed/Used Tools: Flanges Flange Size: 24 (mom using #27 flanges needs 24)   Consult Status Consult Status: PRN Date: 06/17/20 Follow-up type: In-patient    Karver Fadden, Elta [place] 06/16/2020, 2:10 AM

## 2020-06-21 ENCOUNTER — Telehealth: Payer: Self-pay

## 2020-06-21 DIAGNOSIS — O0990 Supervision of high risk pregnancy, unspecified, unspecified trimester: Secondary | ICD-10-CM

## 2020-06-21 DIAGNOSIS — O1210 Gestational proteinuria, unspecified trimester: Secondary | ICD-10-CM

## 2020-06-21 DIAGNOSIS — O24119 Pre-existing diabetes mellitus, type 2, in pregnancy, unspecified trimester: Secondary | ICD-10-CM

## 2020-06-21 DIAGNOSIS — O10019 Pre-existing essential hypertension complicating pregnancy, unspecified trimester: Secondary | ICD-10-CM

## 2020-06-21 NOTE — Telephone Encounter (Signed)
Elevated BP alert received from Babyscripts. 06/20/20 at 4:43 PM BP was 142/61. Per chart review pt has history of hypertension in pregnancy. Called pt to follow up, unable to leave VM. MyChart message sent.

## 2020-06-26 ENCOUNTER — Encounter: Payer: Self-pay | Admitting: Emergency Medicine

## 2020-06-26 ENCOUNTER — Emergency Department: Payer: Medicaid Other

## 2020-06-26 ENCOUNTER — Emergency Department
Admission: EM | Admit: 2020-06-26 | Discharge: 2020-06-26 | Disposition: A | Discharge status: Home / Self Care | Payer: Medicaid Other | Attending: Emergency Medicine | Admitting: Emergency Medicine

## 2020-06-26 ENCOUNTER — Other Ambulatory Visit: Payer: Self-pay

## 2020-06-26 DIAGNOSIS — K2900 Acute gastritis without bleeding: Secondary | ICD-10-CM | POA: Diagnosis not present

## 2020-06-26 DIAGNOSIS — Z79899 Other long term (current) drug therapy: Secondary | ICD-10-CM | POA: Insufficient documentation

## 2020-06-26 DIAGNOSIS — Z794 Long term (current) use of insulin: Secondary | ICD-10-CM | POA: Diagnosis not present

## 2020-06-26 DIAGNOSIS — Z7982 Long term (current) use of aspirin: Secondary | ICD-10-CM | POA: Diagnosis not present

## 2020-06-26 DIAGNOSIS — R0789 Other chest pain: Secondary | ICD-10-CM | POA: Diagnosis not present

## 2020-06-26 DIAGNOSIS — R112 Nausea with vomiting, unspecified: Secondary | ICD-10-CM | POA: Diagnosis not present

## 2020-06-26 DIAGNOSIS — R1110 Vomiting, unspecified: Secondary | ICD-10-CM | POA: Diagnosis not present

## 2020-06-26 DIAGNOSIS — I2510 Atherosclerotic heart disease of native coronary artery without angina pectoris: Secondary | ICD-10-CM | POA: Diagnosis not present

## 2020-06-26 DIAGNOSIS — E039 Hypothyroidism, unspecified: Secondary | ICD-10-CM | POA: Insufficient documentation

## 2020-06-26 DIAGNOSIS — I5033 Acute on chronic diastolic (congestive) heart failure: Secondary | ICD-10-CM | POA: Insufficient documentation

## 2020-06-26 DIAGNOSIS — R1010 Upper abdominal pain, unspecified: Secondary | ICD-10-CM | POA: Diagnosis not present

## 2020-06-26 DIAGNOSIS — I110 Hypertensive heart disease with heart failure: Secondary | ICD-10-CM | POA: Insufficient documentation

## 2020-06-26 DIAGNOSIS — E1165 Type 2 diabetes mellitus with hyperglycemia: Secondary | ICD-10-CM | POA: Diagnosis not present

## 2020-06-26 DIAGNOSIS — R110 Nausea: Secondary | ICD-10-CM | POA: Diagnosis not present

## 2020-06-26 LAB — BASIC METABOLIC PANEL
Anion gap: 8 (ref 5–15)
BUN: 12 mg/dL (ref 6–20)
CO2: 31 mmol/L (ref 22–32)
Calcium: 8.8 mg/dL — ABNORMAL LOW (ref 8.9–10.3)
Chloride: 98 mmol/L (ref 98–111)
Creatinine, Ser: 0.81 mg/dL (ref 0.44–1.00)
GFR, Estimated: >60 mL/min (ref >60)
Glucose, Bld: 439 mg/dL — ABNORMAL HIGH (ref 70–99)
Potassium: 3.3 mmol/L — ABNORMAL LOW (ref 3.5–5.1)
Sodium: 137 mmol/L (ref 135–145)

## 2020-06-26 LAB — HEPATIC FUNCTION PANEL
ALT: 38 U/L (ref 0–44)
AST: 81 U/L — ABNORMAL HIGH (ref 15–41)
Albumin: 3 g/dL — ABNORMAL LOW (ref 3.5–5.0)
Alkaline Phosphatase: 136 U/L — ABNORMAL HIGH (ref 38–126)
Bilirubin, Direct: 0.6 mg/dL — ABNORMAL HIGH (ref 0.0–0.2)
Indirect Bilirubin: 0.8 mg/dL (ref 0.3–0.9)
Total Bilirubin: 1.4 mg/dL — ABNORMAL HIGH (ref 0.3–1.2)
Total Protein: 7.1 g/dL (ref 6.5–8.1)

## 2020-06-26 LAB — TROPONIN I (HIGH SENSITIVITY)
Troponin I (High Sensitivity): 8 ng/L (ref <18)
Troponin I (High Sensitivity): 7 ng/L (ref <18)

## 2020-06-26 LAB — CBC
HCT: 35 % — ABNORMAL LOW (ref 36.0–46.0)
Hemoglobin: 12.1 g/dL (ref 12.0–15.0)
MCH: 30.9 pg (ref 26.0–34.0)
MCHC: 34.6 g/dL (ref 30.0–36.0)
MCV: 89.3 fL (ref 80.0–100.0)
Platelets: 349 10*3/uL (ref 150–400)
RBC: 3.92 MIL/uL (ref 3.87–5.11)
RDW: 12.9 % (ref 11.5–15.5)
WBC: 10.8 10*3/uL — ABNORMAL HIGH (ref 4.0–10.5)
nRBC: 0 % (ref 0.0–0.2)

## 2020-06-26 LAB — LIPASE, BLOOD: Lipase: 71 U/L — ABNORMAL HIGH (ref 11–51)

## 2020-06-26 MED ORDER — METOCLOPRAMIDE HCL 5 MG/ML IJ SOLN
10.0000 mg | Freq: Once | INTRAMUSCULAR | Status: AC
  Administered 2020-06-26: 10 mg via INTRAVENOUS
  Filled 2020-06-26: qty 2

## 2020-06-26 MED ORDER — SUCRALFATE 1 G PO TABS: 1.0000 g | ORAL_TABLET | Freq: Four times a day (QID) | ORAL | 1 refills | Status: DC

## 2020-06-26 MED ORDER — KETOROLAC TROMETHAMINE 30 MG/ML IJ SOLN
30.0000 mg | Freq: Once | INTRAMUSCULAR | Status: AC
  Administered 2020-06-26: 30 mg via INTRAVENOUS
  Filled 2020-06-26: qty 1

## 2020-06-26 MED ORDER — FAMOTIDINE 20 MG PO TABS: 20.0000 mg | ORAL_TABLET | Freq: Two times a day (BID) | ORAL | 0 refills | Status: DC

## 2020-06-26 MED ORDER — ONDANSETRON 4 MG PO TBDP
4.0000 mg | ORAL_TABLET | Freq: Once | ORAL | Status: AC
  Administered 2020-06-26: 4 mg via ORAL
  Filled 2020-06-26: qty 1

## 2020-06-26 MED ORDER — IOHEXOL 300 MG/ML  SOLN
100.0000 mL | Freq: Once | INTRAMUSCULAR | Status: AC | PRN
  Administered 2020-06-26: 100 mL via INTRAVENOUS

## 2020-06-26 MED ORDER — ONDANSETRON HCL 4 MG/2ML IJ SOLN
4.0000 mg | Freq: Once | INTRAMUSCULAR | Status: AC
  Administered 2020-06-26: 4 mg via INTRAVENOUS
  Filled 2020-06-26: qty 2

## 2020-06-26 MED ORDER — FAMOTIDINE 20 MG PO TABS
40.0000 mg | ORAL_TABLET | Freq: Once | ORAL | Status: AC
  Administered 2020-06-26: 40 mg via ORAL
  Filled 2020-06-26: qty 2

## 2020-06-26 MED ORDER — PANTOPRAZOLE SODIUM 40 MG IV SOLR
40.0000 mg | Freq: Once | INTRAVENOUS | Status: AC
  Administered 2020-06-26: 40 mg via INTRAVENOUS
  Filled 2020-06-26: qty 40

## 2020-06-26 MED ORDER — METOCLOPRAMIDE HCL 10 MG PO TABS: 10.0000 mg | ORAL_TABLET | Freq: Four times a day (QID) | ORAL | 0 refills | Status: DC | PRN

## 2020-06-26 MED ORDER — SUCRALFATE 1 G PO TABS
1.0000 g | ORAL_TABLET | Freq: Once | ORAL | Status: AC
  Administered 2020-06-26: 1 g via ORAL
  Filled 2020-06-26: qty 1

## 2020-06-26 NOTE — ED Notes (Signed)
Pt with moderate amt emesis

## 2020-06-26 NOTE — ED Notes (Signed)
Patient transported to CT 

## 2020-06-26 NOTE — ED Notes (Signed)
Pt returned from [place]; her brother is at her bedside

## 2020-06-26 NOTE — ED Triage Notes (Signed)
Pt to ED via POV c/o chest pain and shortness of breath since this afternoon around 2 pm. Pt states that she had just woke up and was getting her kids dressed when the pain started. Pt states that the pain is in the center of her chest and radiates into her back. Pt states that she has shortness of breath with exertion. Pt states that she had 1 episode of vomiting before she came to the hospital. Pt denies any other symptoms. Pt is in NAD.
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
## 2020-06-27 ENCOUNTER — Telehealth: Payer: Self-pay | Admitting: *Deleted

## 2020-06-27 NOTE — Telephone Encounter (Signed)
Transition Care Management Unsuccessful Follow-up Telephone Call  Date of discharge and from where:  06/26/20 [facility] ED  Attempts:  1st Attempt  Reason for unsuccessful TCM follow-up call:  Unable to reach patient

## 2020-06-28 NOTE — Telephone Encounter (Signed)
Transition Care Management Unsuccessful Follow-up Telephone Call  Date of discharge and from where:  06/26/20 [facility] ED  Attempts:  2nd Attempt  Reason for unsuccessful TCM follow-up call:  Unable to reach patient

## 2020-06-30 NOTE — Telephone Encounter (Signed)
Message will be closed as we are outside of the 72 hour window to contact patient.

## 2020-07-05 ENCOUNTER — Encounter: Payer: Self-pay | Admitting: Family Medicine

## 2020-07-05 ENCOUNTER — Other Ambulatory Visit: Payer: Self-pay

## 2020-07-05 ENCOUNTER — Ambulatory Visit (INDEPENDENT_AMBULATORY_CARE_PROVIDER_SITE_OTHER): Payer: Medicaid Other | Admitting: Family Medicine

## 2020-07-05 VITALS — BP 150/88 | HR 103 | Wt 269.4 lb

## 2020-07-05 DIAGNOSIS — R110 Nausea: Secondary | ICD-10-CM | POA: Diagnosis not present

## 2020-07-05 DIAGNOSIS — E119 Type 2 diabetes mellitus without complications: Secondary | ICD-10-CM

## 2020-07-05 DIAGNOSIS — I10 Essential (primary) hypertension: Secondary | ICD-10-CM

## 2020-07-05 MED ORDER — ONDANSETRON HCL 4 MG PO TABS: 4.0000 mg | ORAL_TABLET | Freq: Three times a day (TID) | ORAL | 0 refills | Status: DC | PRN

## 2020-07-05 MED ORDER — LIRAGLUTIDE 18 MG/3ML ~~LOC~~ SOPN: 0.6000 mg | PEN_INJECTOR | Freq: Every day | SUBCUTANEOUS | 0 refills | Status: DC

## 2020-07-05 NOTE — Progress Notes (Signed)
    SUBJECTIVE:   CHIEF COMPLAINT / HPI: Follow-up  Lauren Delgado is a 34 year old female presenting discussed the following:  Postpartum: Repeat low transverse C-section on 9/29 at 34 weeks.  Pregnancy complicated by past medical history as below in addition to fetal growth restriction with under 5% with persistent absent end-diastolic umbilical artery Dopplers.  Had tubal ligation at this time as well.  C-section scar healed well.  Following up with her OB/GYN tomorrow, 11/3.  She is currently breast-feeding.  T2DM: Last A1c 7.7 on 05/07/2020.  Prescribed Lantus 17 units, NovoLog 8 units 3 times daily--reports she usually does her NovoLog twice a day.  She is hoping to decrease the amount of injection she does on a daily basis.  Fasting glucose is usually anywhere from 80-105, postprandial around 120-130.  Previously intolerant to Metformin due to recurrent diarrhea and abdominal cramping.  Hypertension: Follows with cardiology, She sees them is 09/2020.  She reports compliance to benazepril, furosemide, labetalol, and nifedipine.  States she has not taken her medication yet this morning.  Denies any chest pain, shortness of breath.   PERTINENT  PMH / PSH: Type 2 diabetes, history of NSTEMI, cardiomyopathy, previous HFrEF recovered now chronic diastolic CHF, hypertension  OBJECTIVE:   BP (!) 150/88   Pulse (!) 103   Wt 269 lb 6.4 oz (122.2 kg)   LMP 10/05/2019 (Exact Date)   SpO2 97%   BMI 49.27 kg/m   General: Alert, NAD HEENT: NCAT, MMM Cardiac: RRR no m/g/r Lungs: Clear bilaterally, no increased WOB on RA Msk: Moves all extremities spontaneously  Ext: Warm, dry, no edema   ASSESSMENT/PLAN:   Type 2 diabetes mellitus without complication (HCC) Last J3H 7.7 in 05/2020, however suspect improved control with most recent CBGs.  In efforts to decrease daily injections and provide additional cardiovascular benefit/weight loss, Rx'd Victoza daily in place of NovoLog BID.  Discussed that  liraglutide is likely compatible with breast-feeding especially given large molecule size, however to closely monitor.  Educated on common side effects.  Continue Lantus as is.  Monitor CBGs and bring in journal on next visit.  Hypertension Elevated, had not taken antihypertensives yet today.  Encouraged to monitor BP intermittently at home and bring in journal next visit.  Will continue regimen of benazepril, furosemide, labetalol, nifedipine as is for now.    Follow-up in 3 weeks for above or sooner if needed.   Patriciaann Clan, [place]

## 2020-07-05 NOTE — Patient Instructions (Signed)
It was wonderful to see you today.  Reviewed and try to switch to the injectable Victoza which you will take once daily.  If you are able to pick this up, you can stop taking the NovoLog 8 units twice a day.  Continue taking your Lantus.  Please continue to check your sugar fasting and 2 hours after your largest meal, keep a journal of this and bring it in in the next few weeks.  If you can, please also check your blood pressure at home intermittently and keep a journal of this.

## 2020-07-05 NOTE — Assessment & Plan Note (Signed)
Elevated, had not taken antihypertensives yet today.  Encouraged to monitor BP intermittently at home and bring in journal next visit.  Will continue regimen of benazepril, furosemide, labetalol, nifedipine as is for now.

## 2020-07-05 NOTE — Assessment & Plan Note (Addendum)
Last A1c 7.7 in 05/2020, however suspect improved control with most recent CBGs.  In efforts to decrease daily injections and provide additional cardiovascular benefit/weight loss, Rx'd Victoza daily in place of NovoLog BID.  Discussed that liraglutide is likely compatible with breast-feeding especially given large molecule size, however to closely monitor.  Educated on common side effects.  Continue Lantus as is.  Monitor CBGs and bring in journal on next visit.

## 2020-07-06 ENCOUNTER — Ambulatory Visit: Payer: Medicaid Other | Admitting: Obstetrics and Gynecology

## 2020-07-06 ENCOUNTER — Encounter: Payer: Self-pay | Admitting: Family Medicine

## 2020-07-06 ENCOUNTER — Other Ambulatory Visit: Payer: Self-pay | Admitting: Family Medicine

## 2020-07-08 ENCOUNTER — Other Ambulatory Visit: Payer: Self-pay | Admitting: Family Medicine

## 2020-07-08 ENCOUNTER — Other Ambulatory Visit: Payer: Self-pay | Admitting: Obstetrics & Gynecology

## 2020-07-08 DIAGNOSIS — E119 Type 2 diabetes mellitus without complications: Secondary | ICD-10-CM

## 2020-07-08 DIAGNOSIS — I10 Essential (primary) hypertension: Secondary | ICD-10-CM

## 2020-07-08 MED ORDER — INSULIN PEN NEEDLE 31G X 4 MM MISC: 1.0000 | Freq: Two times a day (BID) | 1 refills | Status: DC

## 2020-07-12 ENCOUNTER — Telehealth: Payer: Self-pay

## 2020-07-12 NOTE — Telephone Encounter (Signed)
Received fax from pharmacy requesting prior authorization of Victoza. Form placed in MD's box for completion.

## 2020-07-18 NOTE — Telephone Encounter (Signed)
Returned to [employer] team.   Patriciaann Clan, DO

## 2020-07-19 NOTE — Telephone Encounter (Signed)
Form faxed to Medicaid with additional information.

## 2020-07-20 ENCOUNTER — Other Ambulatory Visit: Payer: Self-pay | Admitting: Family Medicine

## 2020-07-20 NOTE — Telephone Encounter (Signed)
Received fax from [facility] confirming Victoza approval. The pharmacy and the patient have been updated.

## 2020-08-02 ENCOUNTER — Ambulatory Visit: Payer: Medicaid Other | Admitting: Family Medicine

## 2020-08-16 ENCOUNTER — Other Ambulatory Visit: Payer: Self-pay | Admitting: Family Medicine

## 2020-08-16 DIAGNOSIS — E119 Type 2 diabetes mellitus without complications: Secondary | ICD-10-CM

## 2020-08-17 ENCOUNTER — Encounter: Payer: Self-pay | Admitting: Obstetrics and Gynecology

## 2020-09-07 ENCOUNTER — Other Ambulatory Visit: Payer: Self-pay | Admitting: Family Medicine

## 2020-09-09 IMAGING — US US MFM OB DETAIL+14 WK
1 series · 12 of 28 positions shown · non-contrast
Comparison: none

[Series 1: us mfm ob detail+14 wk · 12 of 50 slices shown]
[im 2/50]
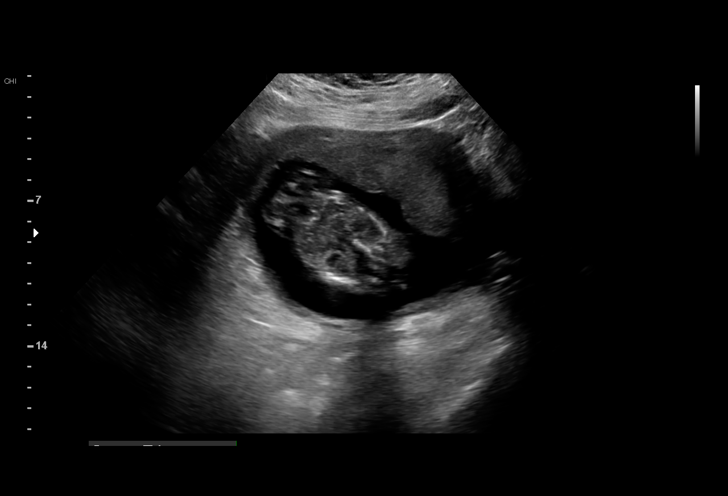
[im 6/50]
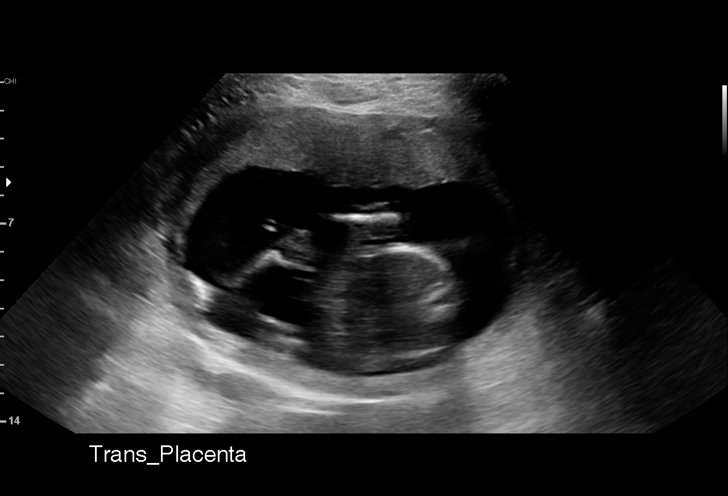
[im 10/50]
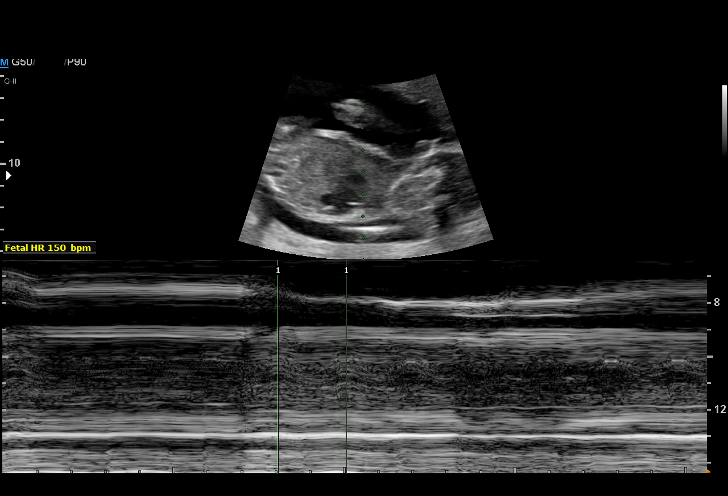
[im 15/50]
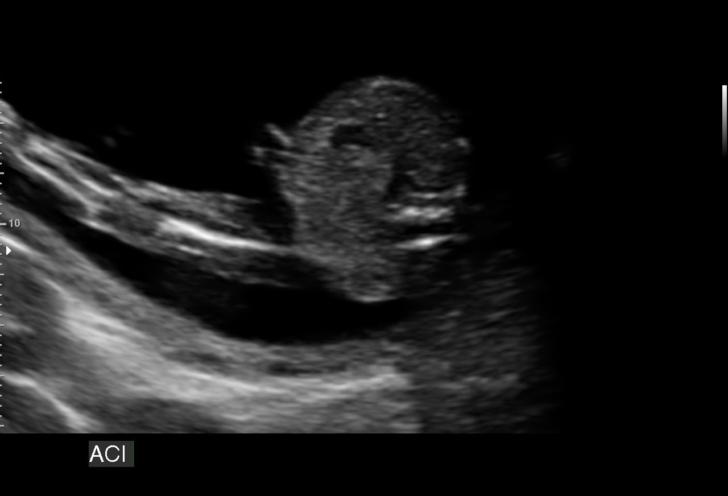
[im 19/50]
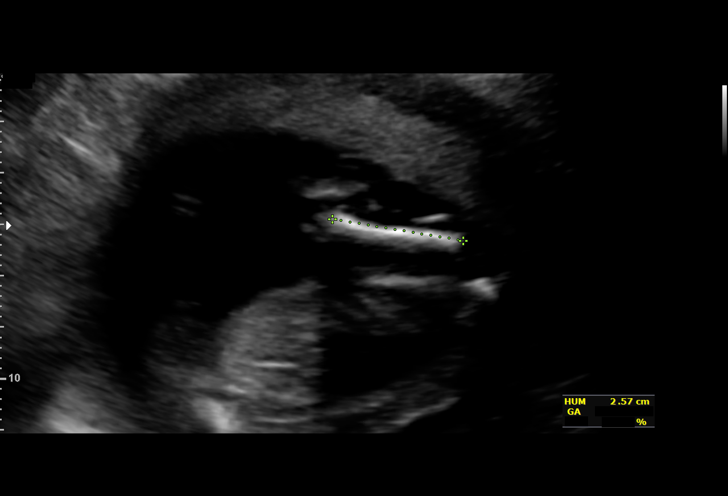
[im 22/50]
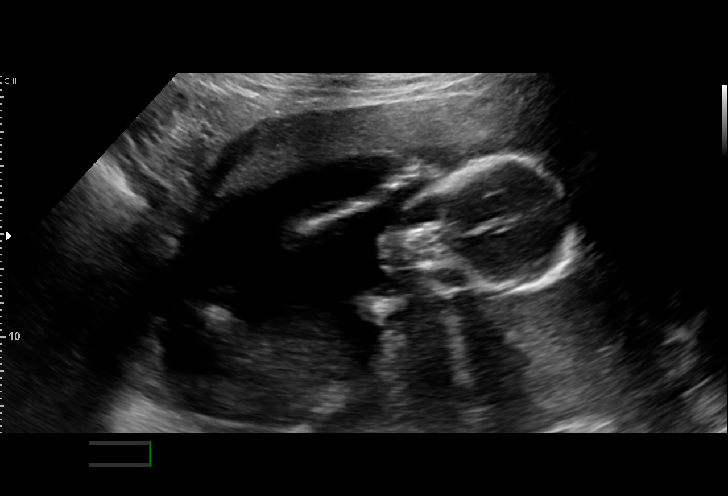
[im 28/50]
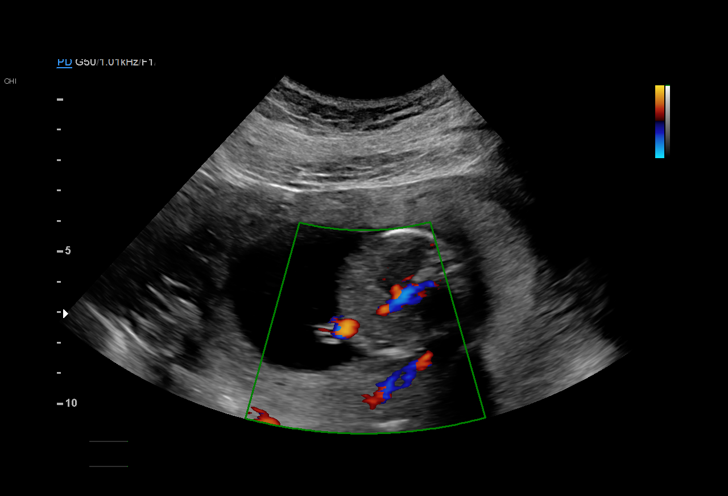
[im 31/50]
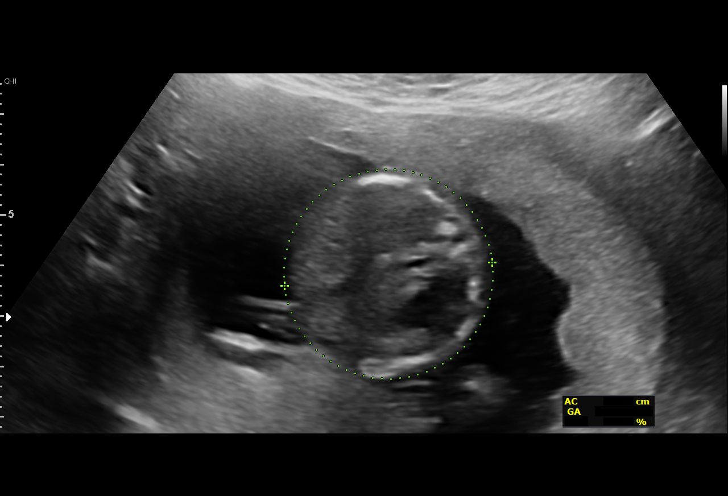
[im 35/50]
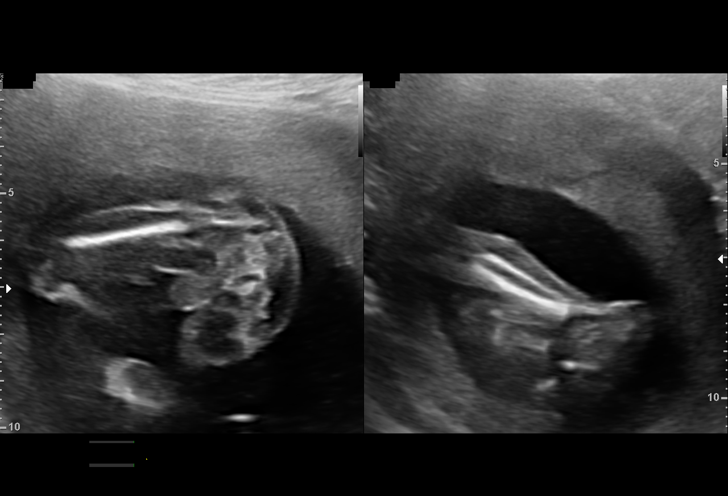
[im 40/50]
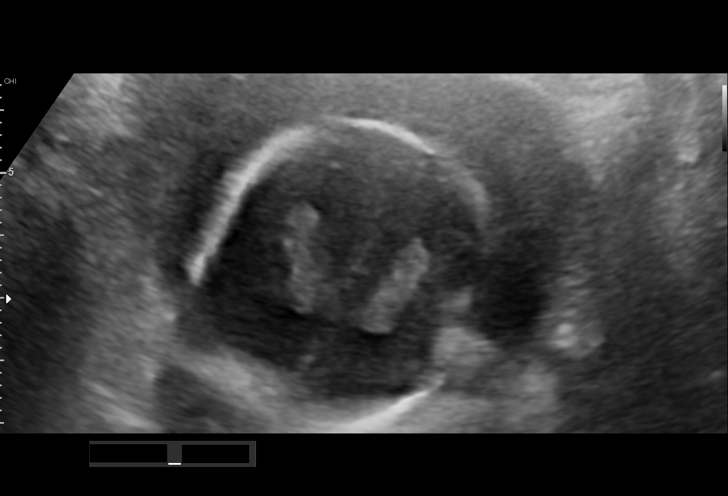
[im 44/50]
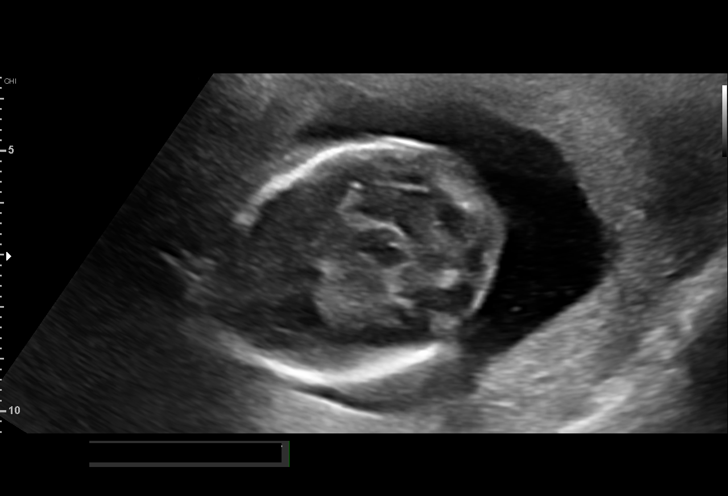
[im 48/50]
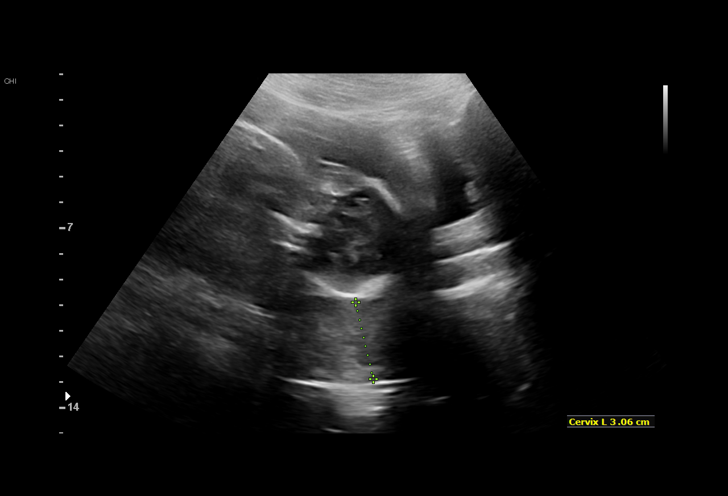

[12 of 28 positions shown; findings below may reference images not displayed]

Name:       WOO CHUL JEOHN              Visit Date: 02/15/2020 [REDACTED]

                                                      NELVIS

Indications

 Pre-existing diabetes, type 2, in pregnancy,
 first trimester (Insulin)
 Obesity complicating pregnancy, first
 trimester
 Hypertension - Chronic/Pre-existing
 Encounter for antenatal screening for
 malformations
 History of cesarean delivery, currently
 pregnant
 Poor obstetric history: Previous gestational
 diabetes
 Congestive Heart Failure
 19 weeks gestation of pregnancy
Vital Signs

 BMI:
Fetal Evaluation

 Num Of Fetuses:         1
 Fetal Heart Rate(bpm):  150
 Cardiac Activity:       Observed
 Presentation:           Cephalic
 Placenta:               Anterior
 P. Cord Insertion:      Not well visualized
 Amniotic Fluid
 AFI FV:      Within normal limits

                             Largest Pocket(cm)

Biometry

 BPD:      42.1  mm     G. Age:  18w 5d         39  %    CI:        73.03   %    70 - 86
                                                         FL/HC:      17.7   %    16.1 -
 HC:      156.6  mm     G. Age:  18w 4d         23  %    HC/AC:      1.18        1.09 -
 AC:      133.1  mm     G. Age:  18w 6d         39  %    FL/BPD:     65.8   %
 FL:       27.7  mm     G. Age:  18w 3d         25  %    FL/AC:      20.8   %    20 - 24
 HUM:      27.1  mm     G. Age:  18w 4d         40  %
 CER:      19.2  mm     G. Age:  18w 5d         23  %
 NFT:       3.2  mm

 CM:        4.7  mm

 Est. FW:     250  gm      0 lb 9 oz     26  %
OB History

 Gravidity:    2         Term:   1
 Living:       1
Gestational Age

 LMP:           19w 0d        Date:  [DATE]                 EDD:   10/05/19
 U/S Today:     18w 5d                                        EDD:   07/11/20
 Best:          19w 0d     Det. By:  LMP  (07/13/20)          EDD:   10/05/19
Anatomy

 Cranium:               Appears normal         Aortic Arch:            Not well visualized
 Cavum:                 Not well visualized    Ductal Arch:            Not well visualized
 Ventricles:            Not well visualized    Diaphragm:              Appears normal
 Choroid Plexus:        Appears normal         Stomach:                Appears normal, left
                                                                       sided
 Cerebellum:            Appears normal         Abdomen:                Appears normal
 Posterior Fossa:       Appears normal         Abdominal Wall:         Appears nml (cord
                                                                       insert, abd wall)
 Nuchal Fold:           Appears normal         Cord Vessels:           Appears normal (3
                                                                       vessel cord)
 Face:                  Orbits nl; profile not Kidneys:                Not well visualized
                        well visualized
 Lips:                  Not well visualized    Bladder:                Appears normal
 Thoracic:              Appears normal         Spine:                  Appears normal
 Heart:                 Not well visualized    Upper Extremities:      RUE appears
                                                                       normal
 RVOT:                  Not well visualized    Lower Extremities:      Appears normal
 LVOT:                  Not well visualized

 Other:  Male gender. Technically difficult due to maternal habitus and fetal
         position.
Cervix Uterus Adnexa

 Cervix
 Length:              3  cm.
 Normal appearance by transabdominal scan.
 Uterus
 No abnormality visualized.

 Right Ovary
 No adnexal mass visualized.

 Left Ovary
 No adnexal mass visualized.

 Cul De Sac
 No free fluid seen.

 Adnexa
 No abnormality visualized.
Comments

 This patient was seen for a detailed fetal anatomy scan due
 to maternal obesity, pregestational diabetes, chronic
 hypertension, and congestive heart failure following a
 myocardial infarction in 7045.  The patient reports that she
 was diagnosed with diabetes 4 years ago.  She is currently
 treated with NovoLog and Lantus insulin.  Her hemoglobin
 A1c earlier in her pregnancy was 7.8%.  The patient reports
 that she developed congestive heart failure following her
 myocardial infarction.  She is currently treated with labetalol,
 hydralazine, nifedipine, and Lasix.  She reports that she is
 being followed by a cardiologist due to her congestive heart
 failure.  She has a cardiology appointment and maternal
 echocardiogram scheduled tomorrow.  She does not know
 what her current ejection fraction is.  She reports one prior
 full-term cesarean delivery due to recurrent decelerations.
 That pregnancy occurred prior to her myocardial infarction
 and development of congestive heart failure.
 Her cell free DNA test drawn earlier in her current pregnancy
 showed insufficient fetal DNA.
 She was informed that the fetal growth and amniotic fluid
 level were appropriate for her gestational age.
 The views of the fetal anatomy were limited today due to
 extreme maternal body habitus, the fetal position, and the
 fact that she could not lay fully supine due to congestive heart
 failure symptoms.
 The patient was informed that anomalies may be missed due
 to technical limitations. If the fetus is in a suboptimal position
 or maternal habitus is increased, visualization of the fetus in
 the maternal uterus may be impaired.
 The patient was advised to consult with her cardiologist to
 determine if it is safe for her to go through with this
 pregnancy.  She was advised that due to her symptoms, she
 is at significant risk for maternal morbidity and mortality.  Her
 care plan should be discussed at a future multidisciplinary
 meeting.
 She was encouraged to maintain euglycemic control using
 her current insulin regimen as it will provide her with the most
 optimal obstetrical outcomes.
 She should have another cell free DNA test drawn at her next
 prenatal visit for detection of fetal aneuploidy.
 A follow-up exam was scheduled in 4 weeks to complete the
 views of the fetal anatomy.  We will then continue to follow
 her with serial growth ultrasounds.  Twice-weekly fetal testing
 should be started at around 32 weeks.

## 2020-09-19 ENCOUNTER — Other Ambulatory Visit: Payer: Self-pay | Admitting: Family Medicine

## 2020-09-19 DIAGNOSIS — R059 Cough, unspecified: Secondary | ICD-10-CM

## 2020-09-21 ENCOUNTER — Telehealth: Payer: Self-pay | Admitting: Family Medicine

## 2020-09-21 NOTE — Telephone Encounter (Signed)
I attempted to reach [name] today to get her scheduled for a telephone appt with the High Risk Managed Medicaid team. I left my name and number for her to return my call. I will reach out again in the next 7-14 days if I don't hear back from her.

## 2020-09-27 ENCOUNTER — Ambulatory Visit (INDEPENDENT_AMBULATORY_CARE_PROVIDER_SITE_OTHER): Payer: Medicaid Other | Admitting: Internal Medicine

## 2020-09-27 ENCOUNTER — Encounter: Payer: Self-pay | Admitting: Internal Medicine

## 2020-09-27 ENCOUNTER — Ambulatory Visit: Payer: Medicaid Other | Admitting: Internal Medicine

## 2020-09-27 ENCOUNTER — Other Ambulatory Visit: Payer: Self-pay

## 2020-09-27 VITALS — BP 126/72 | HR 90 | Ht 62.0 in | Wt 282.0 lb

## 2020-09-27 DIAGNOSIS — I5042 Chronic combined systolic (congestive) and diastolic (congestive) heart failure: Secondary | ICD-10-CM | POA: Diagnosis not present

## 2020-09-27 DIAGNOSIS — I2510 Atherosclerotic heart disease of native coronary artery without angina pectoris: Secondary | ICD-10-CM | POA: Diagnosis not present

## 2020-09-27 DIAGNOSIS — E785 Hyperlipidemia, unspecified: Secondary | ICD-10-CM

## 2020-09-27 DIAGNOSIS — I10 Essential (primary) hypertension: Secondary | ICD-10-CM | POA: Diagnosis not present

## 2020-09-27 NOTE — Patient Instructions (Signed)
Medication Instructions:  Continue current medications  *If you need a refill on your cardiac medications before your next appointment, please call your pharmacy*   Lab Work: None Ordered   Testing/Procedures: None Ordered   Follow-Up: At [facility], you and your health needs are our priority.  As part of our continuing mission to provide you with exceptional heart care, we have created designated Provider Care Teams.  These Care Teams include your primary Cardiologist (physician) and Advanced Practice Providers (APPs -  Physician Assistants and Nurse Practitioners) who all work together to provide you with the care you need, when you need it.  We recommend signing up for the patient portal called "MyChart".  Sign up information is provided on this After Visit Summary.  MyChart is used to connect with patients for Virtual Visits (Telemedicine).  Patients are able to view lab/test results, encounter notes, upcoming appointments, etc.  Non-urgent messages can be sent to your provider as well.   To learn more about what you can do with MyChart, go to [email or web address].    Your next appointment:   6 month(s)  The format for your next appointment:   In Person  Provider:   You may see Kenneth C [name], MD or one of the following Advanced Practice Providers on your designated Care Team:    Hao Meng, PA-C  Angela Duke, PA-C or   Krista Kroeger, PA-C     
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
## 2020-10-13 ENCOUNTER — Other Ambulatory Visit: Payer: Self-pay

## 2020-10-13 ENCOUNTER — Ambulatory Visit (HOSPITAL_COMMUNITY)
Admission: EM | Admit: 2020-10-13 | Discharge: 2020-10-13 | Disposition: A | Discharge status: Home / Self Care | Payer: Self-pay | Attending: Emergency Medicine | Admitting: Emergency Medicine

## 2020-10-13 ENCOUNTER — Encounter (HOSPITAL_COMMUNITY): Payer: Self-pay | Admitting: Emergency Medicine

## 2020-10-13 DIAGNOSIS — S46812A Strain of other muscles, fascia and tendons at shoulder and upper arm level, left arm, initial encounter: Secondary | ICD-10-CM

## 2020-10-13 DIAGNOSIS — M79605 Pain in left leg: Secondary | ICD-10-CM

## 2020-10-13 MED ORDER — TIZANIDINE HCL 4 MG PO TABS: 4.0000 mg | ORAL_TABLET | Freq: Three times a day (TID) | ORAL | 0 refills | Status: DC | PRN

## 2020-10-13 MED ORDER — IBUPROFEN 600 MG PO TABS: 600.0000 mg | ORAL_TABLET | Freq: Four times a day (QID) | ORAL | 0 refills | Status: DC | PRN
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
## 2020-10-13 NOTE — Discharge Instructions (Addendum)
Take 600 mg of ibuprofen combined with 1000 mg of Tylenol 3-4 times a day as needed for pain.  If you are breast-feeding, consider using formula while you are taking the Zanaflex.  There is no evidence that it is passed through the milk, but I would use formula just to be on the safe side.  You may be more sore in the next few days.  Some people are sore for several weeks.  Deep tissue massage can be very helpful.  [facility] in excellent location.  Heat or ice, whichever feels better.
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
## 2020-10-20 ENCOUNTER — Ambulatory Visit (INDEPENDENT_AMBULATORY_CARE_PROVIDER_SITE_OTHER): Payer: Self-pay | Admitting: Family Medicine

## 2020-10-20 ENCOUNTER — Other Ambulatory Visit: Payer: Self-pay

## 2020-10-20 ENCOUNTER — Encounter: Payer: Self-pay | Admitting: Family Medicine

## 2020-10-20 VITALS — BP 198/108 | HR 98 | Wt 287.0 lb

## 2020-10-20 DIAGNOSIS — E119 Type 2 diabetes mellitus without complications: Secondary | ICD-10-CM

## 2020-10-20 DIAGNOSIS — Z041 Encounter for examination and observation following transport accident: Secondary | ICD-10-CM | POA: Insufficient documentation

## 2020-10-20 DIAGNOSIS — I10 Essential (primary) hypertension: Secondary | ICD-10-CM

## 2020-10-20 DIAGNOSIS — M549 Dorsalgia, unspecified: Secondary | ICD-10-CM

## 2020-10-20 LAB — POCT GLYCOSYLATED HEMOGLOBIN (HGB A1C): HbA1c POC (<> result, manual entry): >15 % (ref 4.0–5.6)

## 2020-10-20 MED ORDER — CYCLOBENZAPRINE HCL 5 MG PO TABS: 5.0000 mg | ORAL_TABLET | Freq: Three times a day (TID) | ORAL | 0 refills | Status: DC | PRN
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
## 2020-10-20 NOTE — Assessment & Plan Note (Addendum)
A1c >15 today, repeated to confirm, in the setting of discontinuing all hypoglycemic agents for the past several weeks.  No s/sx concerning for DKA/HHS as likely subacute/chronic CBG change, will check BMP.  Through extensive discussion, she is willing to restart her Lantus 17 U.  Normal check CBG twice daily, keep journal.  Follow-up in 1 week, more likely to consider addition of glipizide for cost and oral admin.

## 2020-10-20 NOTE — Assessment & Plan Note (Signed)
Significantly elevated today, had not taken a few of her medications and also with increased stress/pain.  Asymptomatic otherwise.  Instructed to take all BP medications, check BP at home when relaxed and send measurements via MyChart tomorrow.

## 2020-10-20 NOTE — Patient Instructions (Addendum)
It was wonderful to see you today!   Please start checking your sugar fasting, 2 hours after your largest meal. Start taking lantus 17 U again, can hold on the victoza.   It is very important that you take your medications. Please bring them in your next office visit.   If any weakness, numbness/tingling please follow up.

## 2020-10-21 ENCOUNTER — Encounter: Payer: Self-pay | Admitting: Family Medicine

## 2020-10-21 LAB — BASIC METABOLIC PANEL
BUN/Creatinine Ratio: 23 (ref 9–23)
BUN: 23 mg/dL — ABNORMAL HIGH (ref 6–20)
CO2: 23 mmol/L (ref 20–29)
Calcium: 9.2 mg/dL (ref 8.7–10.2)
Chloride: 97 mmol/L (ref 96–106)
Creatinine, Ser: 1.02 mg/dL — ABNORMAL HIGH (ref 0.57–1.00)
GFR calc Af Amer: 84 mL/min/{1.73_m2} (ref >59)
GFR calc non Af Amer: 72 mL/min/{1.73_m2} (ref >59)
Glucose: 375 mg/dL — ABNORMAL HIGH (ref 65–99)
Potassium: 4.1 mmol/L (ref 3.5–5.2)
Sodium: 134 mmol/L (ref 134–144)

## 2020-11-15 IMAGING — US US MFM UA CORD DOPPLER
1 series · 14 of 28 positions shown · non-contrast
Comparison: none

[Series 1: us mfm ua cord doppler · 28 acquisitions, 14 frames shown]
[im 2/28]
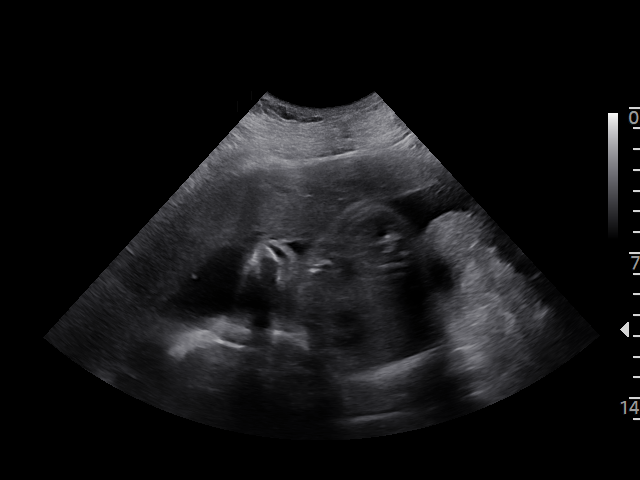
[im 4/28]
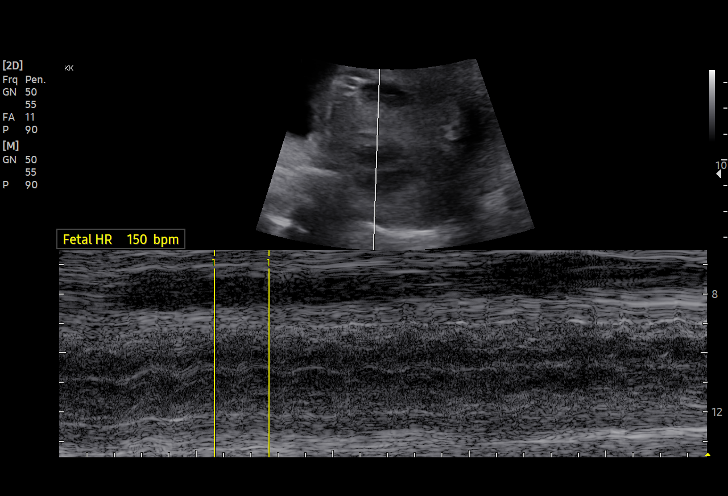
[im 6/28]
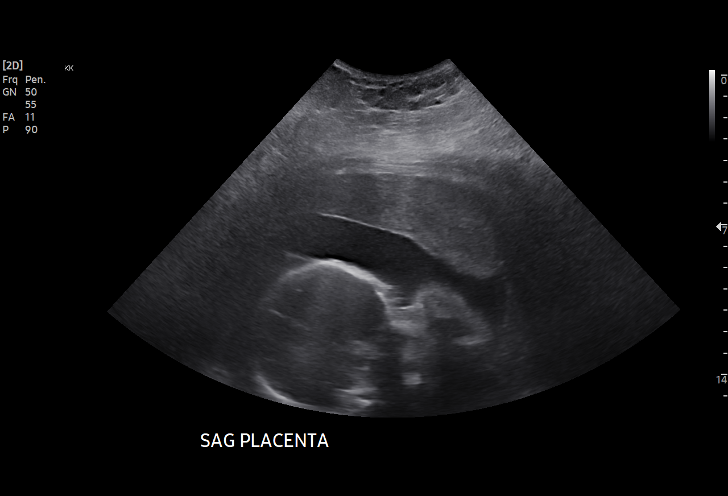
[im 8/28]
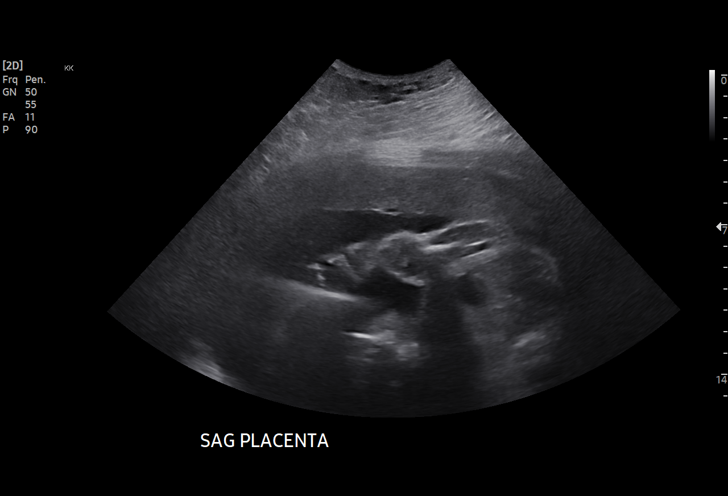
[im 10/28]
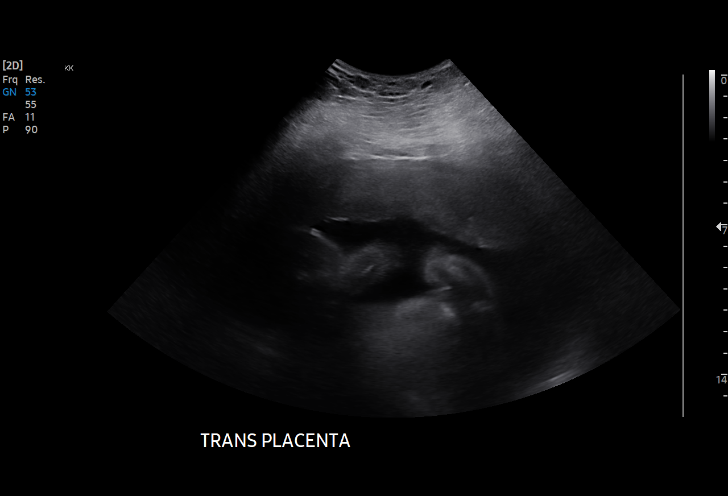
[im 12/28]
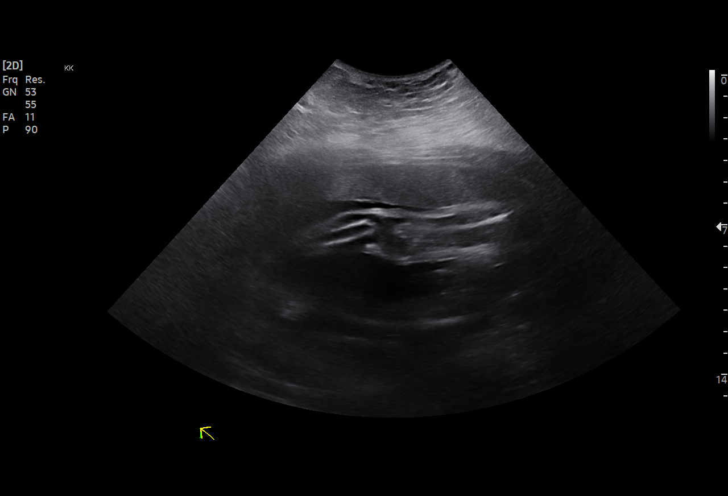
[im 14/28]
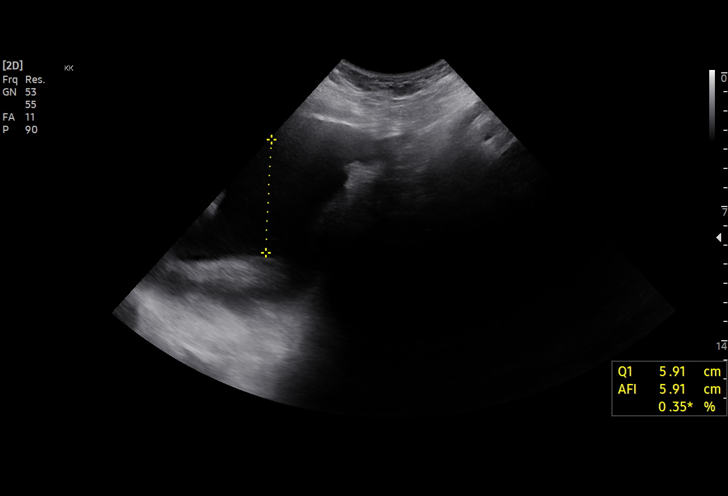
[im 16/28]
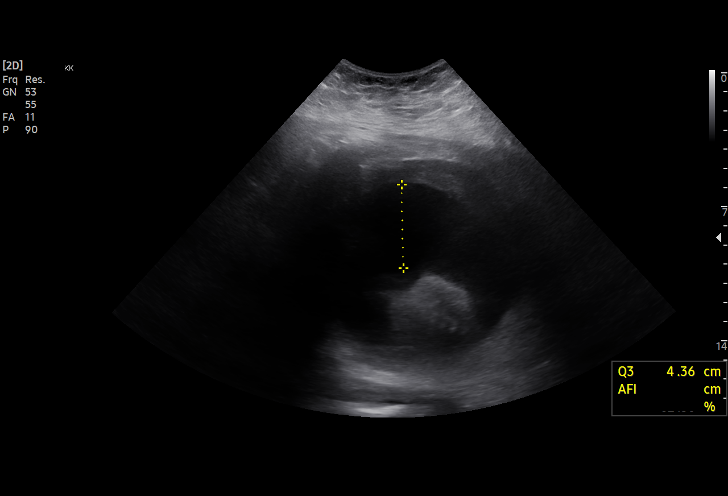
[im 18/28]
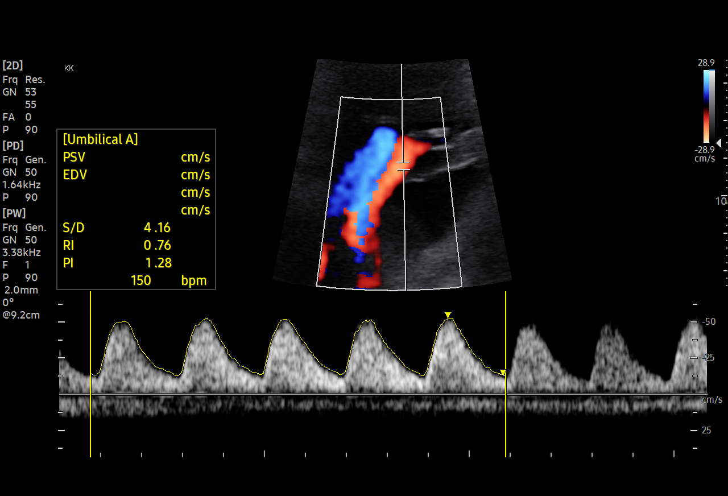
[im 20/28]
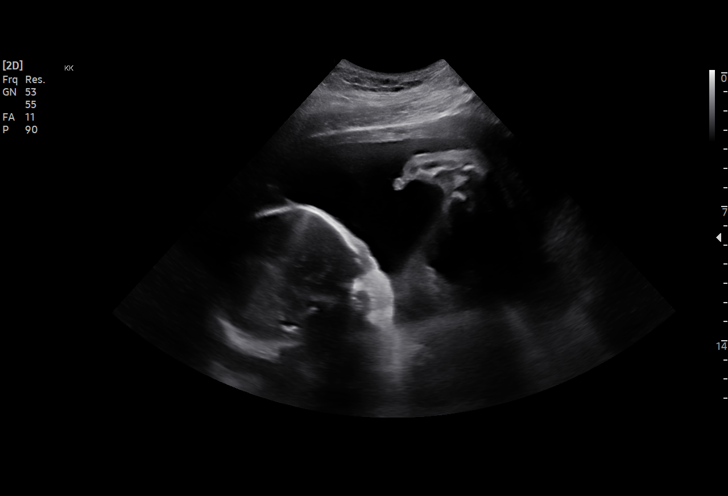
[im 22/28]
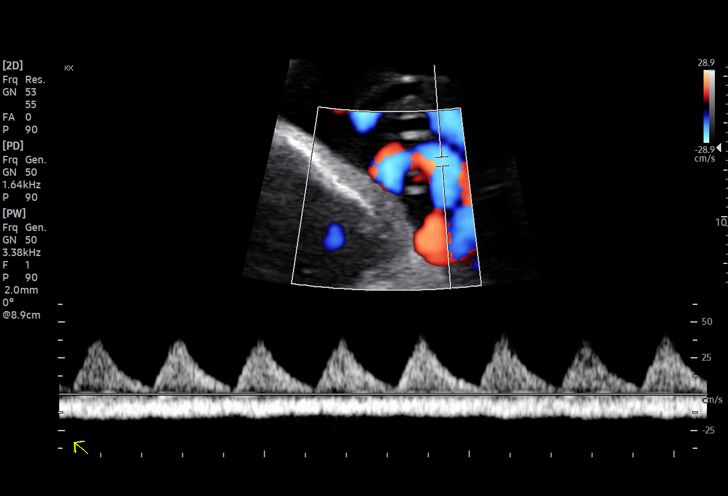
[im 24/28]
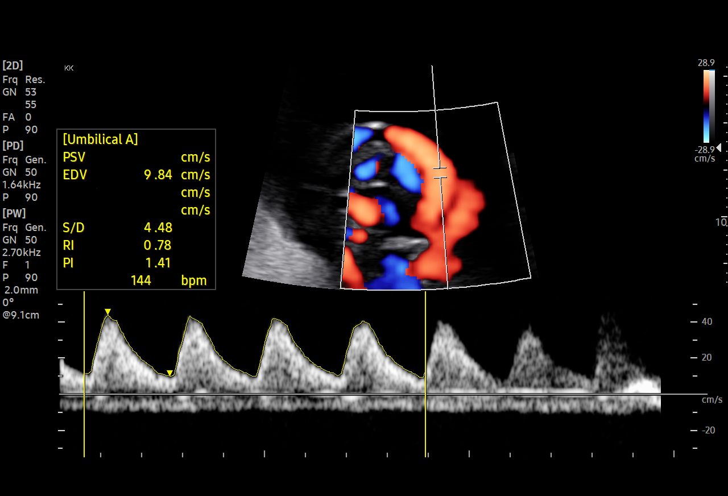
[im 26/28]
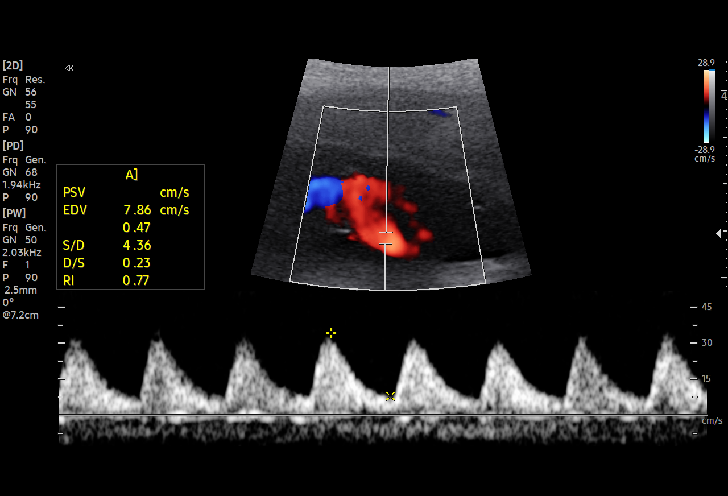
[im 28/28]
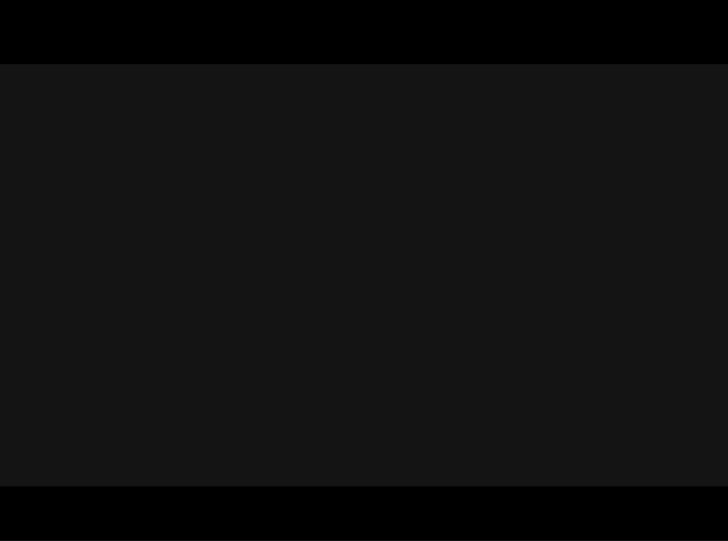

[14 of 28 positions shown; findings below may reference images not displayed]

Indications

 Maternal care for known or suspected poor
 fetal growth, third trimester, fetus 1 IUGR
 Pre-existing diabetes, type 2, in pregnancy,
 unspecified trimester (insulin)
 History of cesarean delivery, currently
 pregnant
 Obesity complicating pregnancy, unspecified
 Hypertension - Chronic/Pre-existing
 Poor obstetric history: Previous gestational
 diabetes
 Congestive Heart Failure
 Small for gestational age fetus affecting
 management of mother
 28 weeks gestation of pregnancy
Vital Signs

                                                Height:        5'2"
Fetal Evaluation

 Num Of Fetuses:         1
 Fetal Heart Rate(bpm):  150
 Cardiac Activity:       Observed
 Presentation:           Transverse, head to maternal left
 Placenta:               Anterior
 P. Cord Insertion:      Marginal insertion
 Amniotic Fluid
 AFI FV:      Within normal limits

 AFI Sum(cm)     %Tile       Largest Pocket(cm)
 19.49           76

 RUQ(cm)       RLQ(cm)       LUQ(cm)        LLQ(cm)

OB History

 Gravidity:    2         Term:   1
 Living:       1
Gestational Age

 LMP:           28w 4d        Date:  10/05/19                 EDD:   07/11/20
 Best:          28w 4d     Det. By:  LMP  (10/05/19)          EDD:   07/11/20
Anatomy

 Stomach:               Visualized             Bladder:                Visualized
Doppler - Fetal Vessels

 Umbilical Artery
  S/D     %tile      RI    %tile      PI    %tile     PSV    ADFV    RDFV
                                                    (cm/s)
   4.4       96     0.5    < 2.5     0.9       31     33.1      No      No

Comments

 This patient was seen due to an IUGR fetus.  Her pregnancy
 has also been complicated by pregestational diabetes,
 chronic hypertension, and history of congestive heart failure
 following a myocardial infarction.  She denies any problems
 since her last exam.
 There was normal amniotic fluid noted on today's ultrasound
 exam.
 Doppler studies of the umbilical arteries performed due to
 fetal growth restriction continues to show normal forward flow
 with an elevated S/D ratio of 4.4.  There were no signs of
 absent or reversed end-diastolic flow noted today.
 The patient had a reactive nonstress test following today's
 ultrasound exam.
 Due to fetal growth restriction, we will continue to follow her
 with weekly fetal testing and umbilical artery Doppler studies.
 A follow-up exam was scheduled in 1 week.

## 2020-11-17 ENCOUNTER — Other Ambulatory Visit: Payer: Self-pay | Admitting: Family Medicine

## 2020-11-17 ENCOUNTER — Encounter: Payer: Self-pay | Admitting: Family Medicine

## 2020-11-17 MED ORDER — VALACYCLOVIR HCL 500 MG PO TABS
500.0000 mg | ORAL_TABLET | Freq: Two times a day (BID) | ORAL | 0 refills | Status: DC
Start: 2020-11-17 — End: 2020-11-18

## 2020-11-18 ENCOUNTER — Other Ambulatory Visit: Payer: Self-pay | Admitting: Family Medicine

## 2020-11-18 DIAGNOSIS — Z8619 Personal history of other infectious and parasitic diseases: Secondary | ICD-10-CM

## 2020-11-18 MED ORDER — VALACYCLOVIR HCL 500 MG PO TABS: 500.0000 mg | ORAL_TABLET | Freq: Two times a day (BID) | ORAL | 0 refills | Status: DC | PRN

## 2020-11-21 ENCOUNTER — Ambulatory Visit: Payer: Medicaid Other | Admitting: Internal Medicine

## 2020-11-25 ENCOUNTER — Encounter: Payer: Self-pay | Admitting: Family Medicine

## 2020-12-11 IMAGING — US US MFM UA CORD DOPPLER
1 series · 13 of 28 positions shown · non-contrast
Comparison: none

[Series 1: us mfm ua cord doppler · 59 acquisitions, 13 frames shown]
[im 3/59]
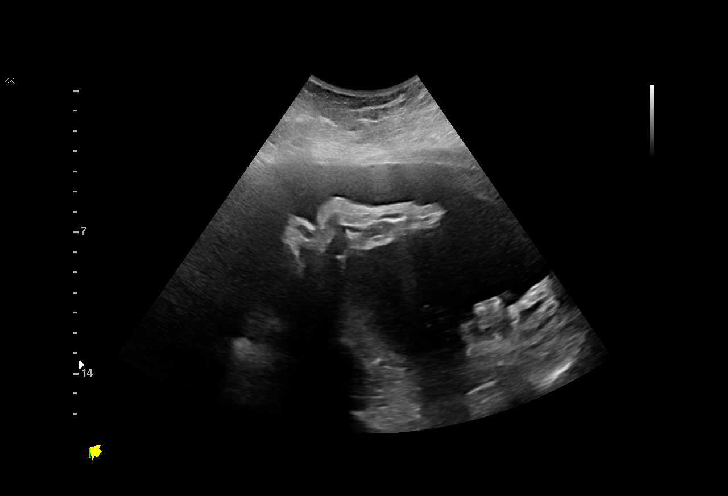
[im 7/59]
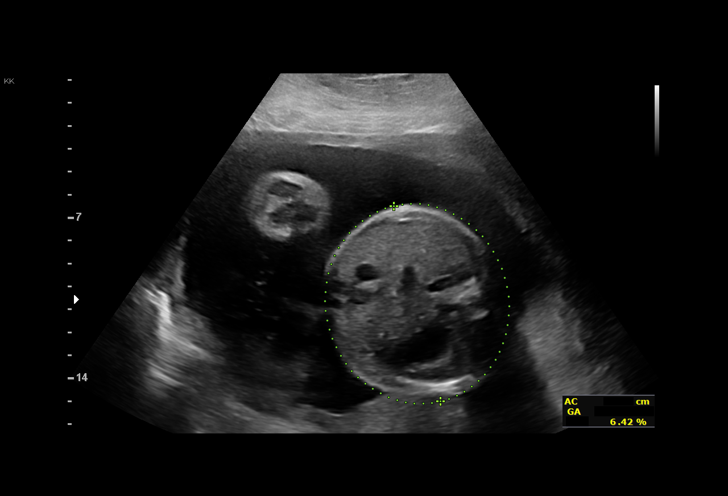
[im 11/59]
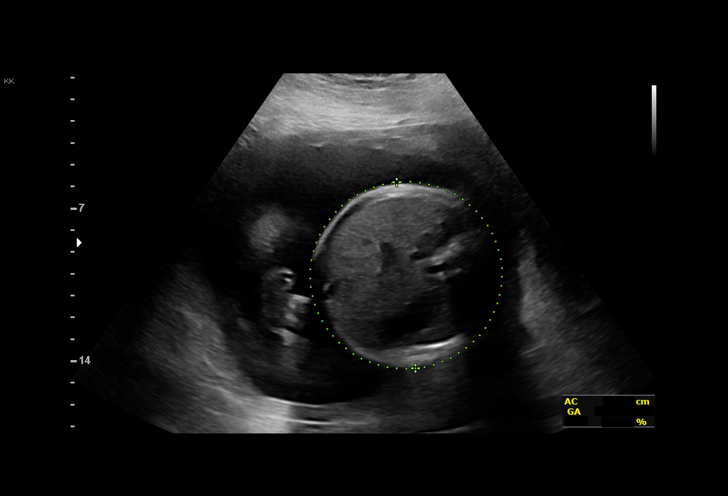
[im 16/59]
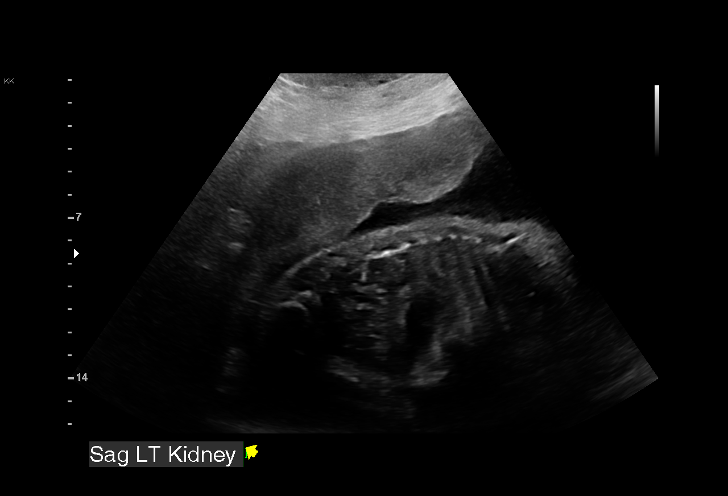
[im 20/59]
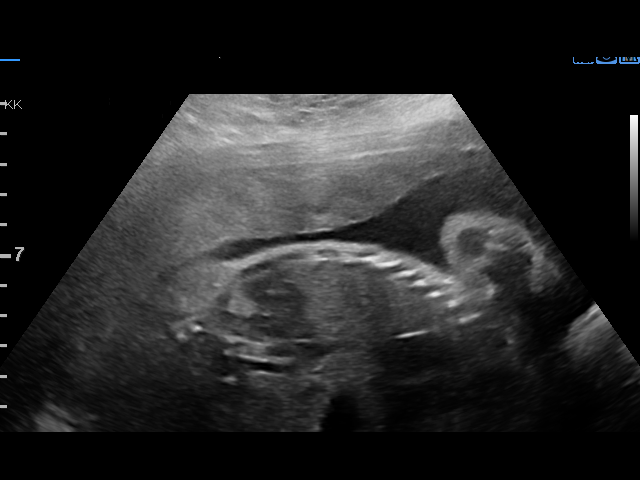
[im 24/59]
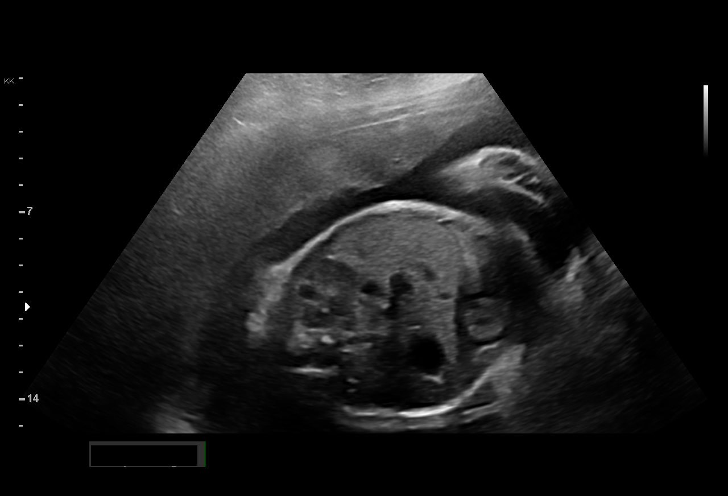
[im 31/59]
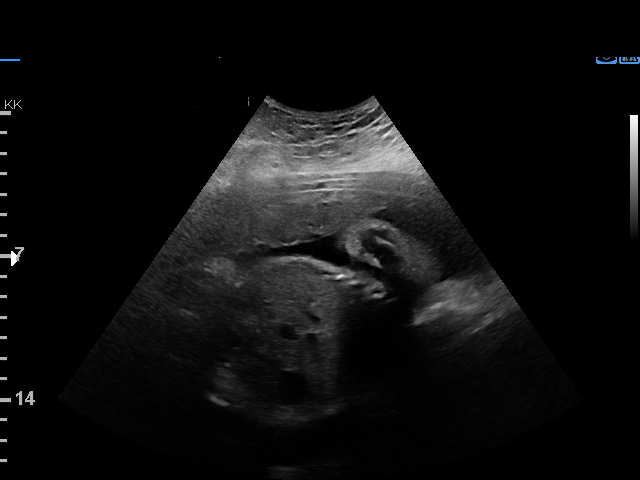
[im 35/59]
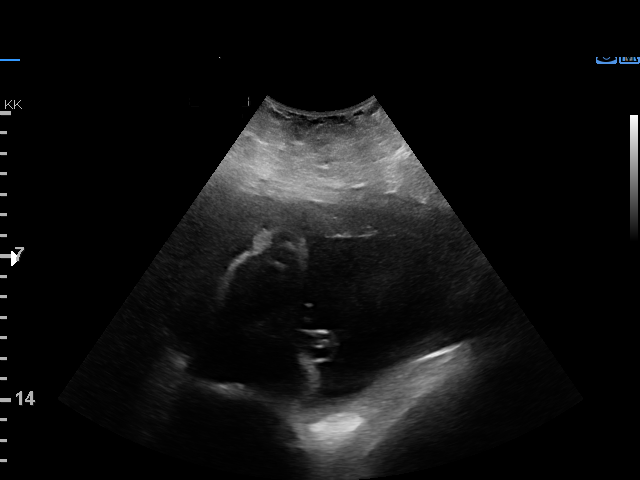
[im 39/59]
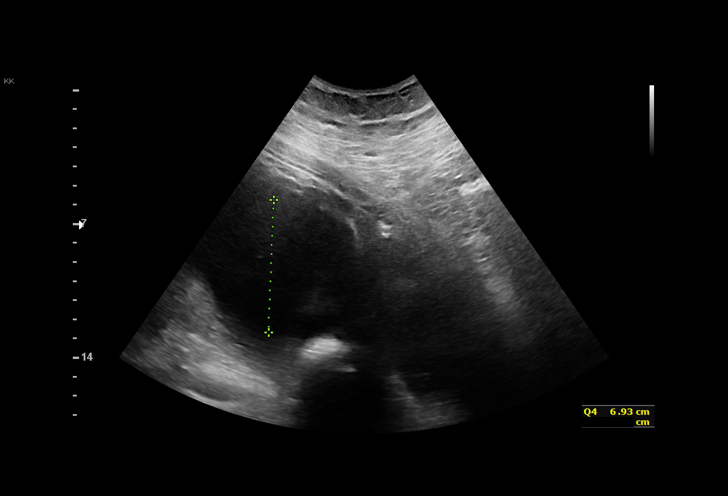
[im 43/59]
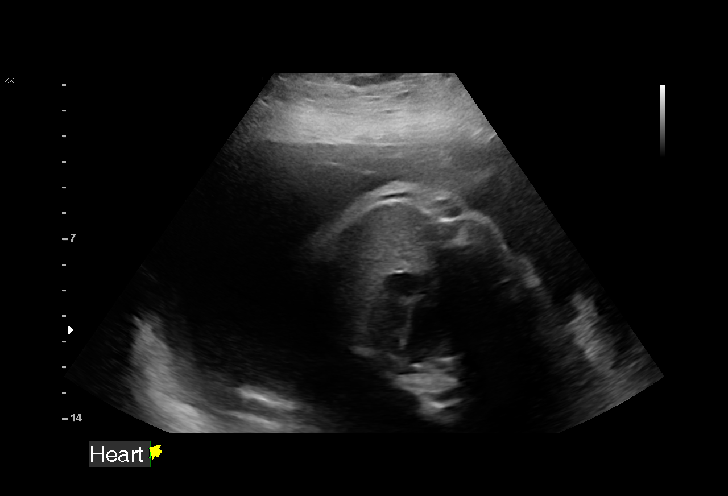
[im 48/59]
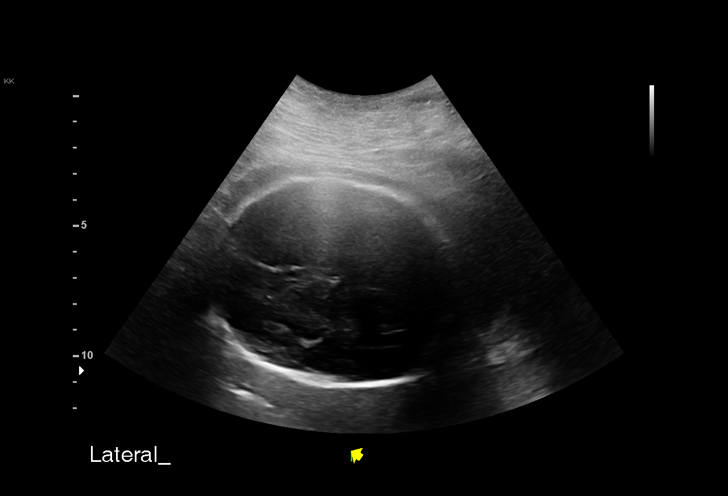
[im 52/59]
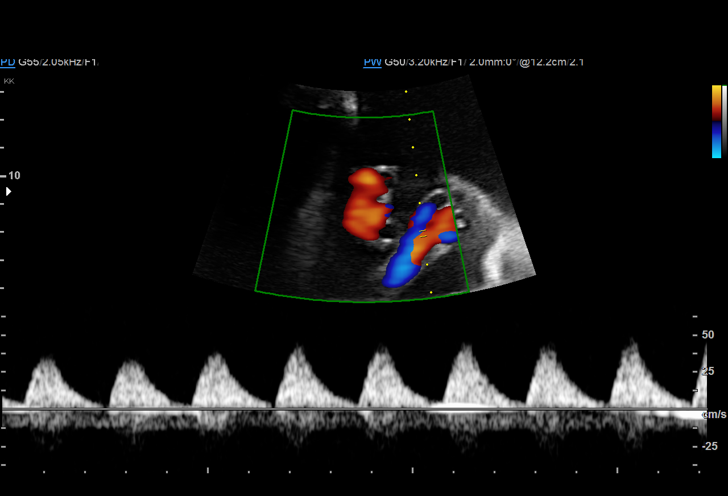
[im 56/59]
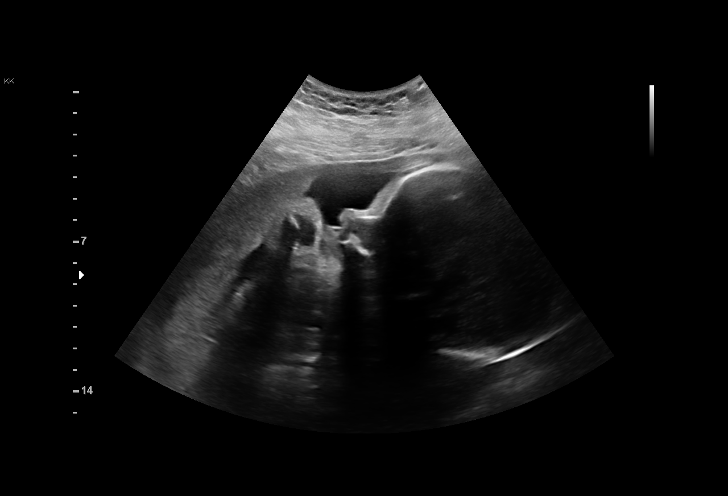

[13 of 28 positions shown; findings below may reference images not displayed]

W/NONSTRESS

Indications

 Pre-existing diabetes, type 2, in pregnancy,
 unspecified trimester (insulin)
 Hypertension - Chronic/Pre-existing(On
 Meds)
 Obesity complicating pregnancy, third
 trimester (Pregravid BMI 52)
 Maternal care for known or suspected poor
 fetal growth, third trimester, fetus 1 IUGR
 History of cesarean delivery, currently
 pregnant
 Poor obstetric history: Previous gestational
 diabetes
 Congestive Heart Failure
 Small for gestational age fetus affecting
 management of mother
 32 weeks gestation of pregnancy
 Encounter for other antenatal screening
 follow-up
Vital Signs

                                                Height:        5'2"
Fetal Evaluation

 Num Of Fetuses:         1
 Fetal Heart Rate(bpm):  149
 Cardiac Activity:       Observed
 Presentation:           Cephalic
 Placenta:               Anterior
 P. Cord Insertion:      Marginal insertion

 Amniotic Fluid
 AFI FV:      Within normal limits

 AFI Sum(cm)     %Tile       Largest Pocket(cm)
 24.08           94

 RUQ(cm)       RLQ(cm)       LUQ(cm)        LLQ(cm)

Biophysical Evaluation

 Amniotic F.V:   Pocket => 2 cm             F. Tone:        Observed
 F. Movement:    Observed                   Score:          [DATE]
 F. Breathing:   Observed
Biometry

 BPD:      75.7  mm     G. Age:  30w 3d        3.7  %    CI:        74.03   %    70 - 86
                                                         FL/HC:      19.7   %    19.1 -
 HC:      279.4  mm     G. Age:  30w 4d        < 1  %    HC/AC:      1.05        0.96 -
 AC:      266.5  mm     G. Age:  30w 5d         11  %    FL/BPD:     72.7   %    71 - 87
 FL:         55  mm     G. Age:  29w 0d        < 1  %    FL/AC:      20.6   %    20 - 24

 Est. FW:    7110  gm      3 lb 6 oz    2.8  %
OB History

 Gravidity:    2         Term:   1
 Living:       1
Gestational Age

 LMP:           32w 2d        Date:  10/05/19                 EDD:   07/11/20
 U/S Today:     30w 1d                                        EDD:   07/26/20
 Best:          32w 2d     Det. By:  LMP  (10/05/19)          EDD:   07/11/20
Anatomy

 Cranium:               Previously seen        Aortic Arch:            Previously seen
 Cavum:                 Previously seen        Ductal Arch:            Not well visualized
 Ventricles:            Appears normal         Diaphragm:              Appears normal
 Choroid Plexus:        Previously seen        Stomach:                Appears normal, left
                                                                       sided
 Cerebellum:            Previously seen        Abdomen:                Previously seen
 Posterior Fossa:       Previously seen        Abdominal Wall:         Previously seen
 Nuchal Fold:           Previously seen        Cord Vessels:           Previously seen
 Face:                  Orbits and profile     Kidneys:                Appear normal
                        previously seen
 Lips:                  Previously seen        Bladder:                Appears normal
 Thoracic:              Previously seen        Spine:                  Previously seen
 Heart:                 Previously seen        Upper Extremities:      RUE prev.vis; LUE
                                                                       visualized
 RVOT:                  Previously seen        Lower Extremities:      Previously seen
 LVOT:                  Previously seen

 Other:  Male gender previously visualized. Technically difficult due to
         maternal habitus and fetal position.
Doppler - Fetal Vessels

 Umbilical Artery
  S/D     %tile      RI    %tile                             ADFV    RDFV
  5.71   > 97.5    1.18   > 97.5                                No      No

Comments

 This patient was seen for a follow up growth scan due to fetal
 growth restriction noted during her prior ultrasound exams.
 Her pregnancy has also been complicated by chronic
 hypertension, diabetes, and heart failure.  She was
 hospitalized last week for glycemic control.
 On today's exam, the EFW measures at the 3rd percentile for
 her gestational age indicating fetal growth restriction.  The
 fetus has grown close to one pound over the past 3 weeks.
 There was normal amniotic fluid noted.
 A biophysical profile performed today due to fetal growth
 restriction was [DATE].  The patient also had a nonstress
 test today that showed 10 x 10 accelerations with no
 decelerations.
 Doppler studies of the umbilical arteries showed a slightly
 elevated S/D ratio of 5.71.  There were no signs of absent or
 reversed end-diastolic flow.
 Due to fetal growth restriction, we will continue to follow her
 with weekly fetal testing and umbilical artery Doppler studies.
 Another exam was scheduled in 1 week.

## 2021-02-07 ENCOUNTER — Other Ambulatory Visit: Payer: Self-pay | Admitting: Family Medicine

## 2021-03-28 ENCOUNTER — Other Ambulatory Visit: Payer: Self-pay | Admitting: Family Medicine

## 2021-03-28 DIAGNOSIS — Z8619 Personal history of other infectious and parasitic diseases: Secondary | ICD-10-CM

## 2021-04-06 ENCOUNTER — Other Ambulatory Visit: Payer: Self-pay

## 2021-04-06 ENCOUNTER — Emergency Department
Admission: EM | Admit: 2021-04-06 | Discharge: 2021-04-06 | Disposition: A | Discharge status: Home / Self Care | Payer: Self-pay | Attending: Emergency Medicine | Admitting: Emergency Medicine

## 2021-04-06 ENCOUNTER — Inpatient Hospital Stay: Admission: RE | Admit: 2021-04-06 | Payer: Self-pay | Source: Ambulatory Visit

## 2021-04-06 DIAGNOSIS — Z955 Presence of coronary angioplasty implant and graft: Secondary | ICD-10-CM | POA: Insufficient documentation

## 2021-04-06 DIAGNOSIS — I509 Heart failure, unspecified: Secondary | ICD-10-CM | POA: Insufficient documentation

## 2021-04-06 DIAGNOSIS — Z79899 Other long term (current) drug therapy: Secondary | ICD-10-CM | POA: Insufficient documentation

## 2021-04-06 DIAGNOSIS — I2510 Atherosclerotic heart disease of native coronary artery without angina pectoris: Secondary | ICD-10-CM | POA: Insufficient documentation

## 2021-04-06 DIAGNOSIS — Z794 Long term (current) use of insulin: Secondary | ICD-10-CM | POA: Insufficient documentation

## 2021-04-06 DIAGNOSIS — N3000 Acute cystitis without hematuria: Secondary | ICD-10-CM | POA: Insufficient documentation

## 2021-04-06 DIAGNOSIS — E119 Type 2 diabetes mellitus without complications: Secondary | ICD-10-CM | POA: Insufficient documentation

## 2021-04-06 DIAGNOSIS — E039 Hypothyroidism, unspecified: Secondary | ICD-10-CM | POA: Insufficient documentation

## 2021-04-06 DIAGNOSIS — Z7982 Long term (current) use of aspirin: Secondary | ICD-10-CM | POA: Insufficient documentation

## 2021-04-06 DIAGNOSIS — I110 Hypertensive heart disease with heart failure: Secondary | ICD-10-CM | POA: Insufficient documentation

## 2021-04-06 LAB — URINALYSIS, COMPLETE (UACMP) WITH MICROSCOPIC
Bilirubin Urine: NEGATIVE
Glucose, UA: >=500 mg/dL — AB
Hgb urine dipstick: NEGATIVE
Ketones, ur: NEGATIVE mg/dL
Leukocytes,Ua: NEGATIVE
Nitrite: POSITIVE — AB
Protein, ur: >=300 mg/dL — AB
Specific Gravity, Urine: 1.024 (ref 1.005–1.030)
pH: 6 (ref 5.0–8.0)

## 2021-04-06 LAB — POC URINE PREG, ED: Preg Test, Ur: NEGATIVE

## 2021-04-06 MED ORDER — CEPHALEXIN 500 MG PO CAPS
500.0000 mg | ORAL_CAPSULE | Freq: Once | ORAL | Status: AC
  Administered 2021-04-06: 500 mg via ORAL
  Filled 2021-04-06: qty 1

## 2021-04-06 MED ORDER — CEPHALEXIN 500 MG PO CAPS: 500.0000 mg | ORAL_CAPSULE | Freq: Three times a day (TID) | ORAL | 0 refills | Status: AC

## 2021-04-06 NOTE — ED Notes (Signed)
See triage note.  Presents with flank pain which moves into groin area  Ambulates well denies any injury

## 2021-04-06 NOTE — Discharge Instructions (Addendum)
Take the antibiotic as directed. Follow-up with [facility] as needed. Return to the ED if necessary.

## 2021-04-06 NOTE — ED Triage Notes (Addendum)
Pt ambulatory to triage with c/o rt lower back pain radiating around side starting last Friday after standing up from couch. Pt denies any urinary pain or symptoms. NAD noted at this time.   Pt hx of hypertension and takes medication but states did not take it this morning.
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
## 2021-04-08 LAB — URINE CULTURE: Culture: >=100000 — AB

## 2021-07-21 ENCOUNTER — Other Ambulatory Visit: Payer: Self-pay | Admitting: Family Medicine

## 2021-07-21 ENCOUNTER — Other Ambulatory Visit: Payer: Self-pay

## 2021-07-21 DIAGNOSIS — Z8619 Personal history of other infectious and parasitic diseases: Secondary | ICD-10-CM

## 2021-07-21 MED ORDER — VALACYCLOVIR HCL 500 MG PO TABS
500.0000 mg | ORAL_TABLET | Freq: Two times a day (BID) | ORAL | 0 refills | Status: DC | PRN
  Filled 2021-07-21: qty 30, 15d supply, fill #0

## 2021-07-24 ENCOUNTER — Other Ambulatory Visit: Payer: Self-pay

## 2021-07-31 ENCOUNTER — Other Ambulatory Visit: Payer: Self-pay

## 2021-08-18 ENCOUNTER — Ambulatory Visit: Payer: Self-pay | Admitting: Adult Health

## 2021-09-01 ENCOUNTER — Emergency Department: Payer: Medicaid Other

## 2021-09-01 DIAGNOSIS — Z5321 Procedure and treatment not carried out due to patient leaving prior to being seen by health care provider: Secondary | ICD-10-CM | POA: Diagnosis not present

## 2021-09-01 DIAGNOSIS — R531 Weakness: Secondary | ICD-10-CM | POA: Diagnosis not present

## 2021-09-01 NOTE — ED Triage Notes (Signed)
Pt is now complaining of numbness/tingling of the right face. No facial droop noted at this time. Equal grip strength. Pt brought to triage 2 and vitals re-assessed. First nurse made aware at this time and is contacting physician.

## 2021-09-01 NOTE — ED Triage Notes (Signed)
Pt is also complaining of her right thumb and right pointer finger feel numb which started a few minutes ago per pt.

## 2021-09-01 NOTE — ED Triage Notes (Signed)
Pt states that she was cooking at home and her right arm became weak. Pt states that now her right hand is feeling like it is going to lock up. She also states that her peripheral vision on the right side became diminished for around a minute or so. Pt denies any visual changes at this time.

## 2021-09-02 ENCOUNTER — Emergency Department
Admission: EM | Admit: 2021-09-02 | Discharge: 2021-09-02 | Disposition: A | Discharge status: Home / Self Care | Payer: Medicaid Other | Attending: Emergency Medicine | Admitting: Emergency Medicine

## 2021-09-02 ENCOUNTER — Emergency Department (HOSPITAL_BASED_OUTPATIENT_CLINIC_OR_DEPARTMENT_OTHER): Payer: Medicaid Other

## 2021-09-02 ENCOUNTER — Inpatient Hospital Stay (HOSPITAL_BASED_OUTPATIENT_CLINIC_OR_DEPARTMENT_OTHER)
Admission: EM | Admit: 2021-09-02 | Discharge: 2021-09-05 | DRG: 065 | Disposition: A | Discharge status: Home / Self Care | Payer: Medicaid Other | Attending: Internal Medicine | Admitting: Internal Medicine

## 2021-09-02 ENCOUNTER — Inpatient Hospital Stay (HOSPITAL_COMMUNITY): Payer: Medicaid Other

## 2021-09-02 ENCOUNTER — Other Ambulatory Visit: Payer: Self-pay

## 2021-09-02 ENCOUNTER — Encounter (HOSPITAL_BASED_OUTPATIENT_CLINIC_OR_DEPARTMENT_OTHER): Payer: Self-pay | Admitting: *Deleted

## 2021-09-02 DIAGNOSIS — E039 Hypothyroidism, unspecified: Secondary | ICD-10-CM | POA: Diagnosis present

## 2021-09-02 DIAGNOSIS — Z5986 Financial insecurity: Secondary | ICD-10-CM | POA: Diagnosis not present

## 2021-09-02 DIAGNOSIS — I110 Hypertensive heart disease with heart failure: Secondary | ICD-10-CM | POA: Diagnosis present

## 2021-09-02 DIAGNOSIS — E669 Obesity, unspecified: Secondary | ICD-10-CM | POA: Diagnosis present

## 2021-09-02 DIAGNOSIS — E8881 Metabolic syndrome: Secondary | ICD-10-CM | POA: Diagnosis present

## 2021-09-02 DIAGNOSIS — Q2112 Patent foramen ovale: Secondary | ICD-10-CM

## 2021-09-02 DIAGNOSIS — G8191 Hemiplegia, unspecified affecting right dominant side: Secondary | ICD-10-CM | POA: Diagnosis present

## 2021-09-02 DIAGNOSIS — Z810 Family history of intellectual disabilities: Secondary | ICD-10-CM

## 2021-09-02 DIAGNOSIS — Z818 Family history of other mental and behavioral disorders: Secondary | ICD-10-CM

## 2021-09-02 DIAGNOSIS — Z7982 Long term (current) use of aspirin: Secondary | ICD-10-CM

## 2021-09-02 DIAGNOSIS — R479 Unspecified speech disturbances: Secondary | ICD-10-CM | POA: Diagnosis not present

## 2021-09-02 DIAGNOSIS — Z79899 Other long term (current) drug therapy: Secondary | ICD-10-CM

## 2021-09-02 DIAGNOSIS — Z833 Family history of diabetes mellitus: Secondary | ICD-10-CM

## 2021-09-02 DIAGNOSIS — Z20822 Contact with and (suspected) exposure to covid-19: Secondary | ICD-10-CM | POA: Diagnosis present

## 2021-09-02 DIAGNOSIS — Z8249 Family history of ischemic heart disease and other diseases of the circulatory system: Secondary | ICD-10-CM

## 2021-09-02 DIAGNOSIS — Z809 Family history of malignant neoplasm, unspecified: Secondary | ICD-10-CM

## 2021-09-02 DIAGNOSIS — I252 Old myocardial infarction: Secondary | ICD-10-CM

## 2021-09-02 DIAGNOSIS — I2510 Atherosclerotic heart disease of native coronary artery without angina pectoris: Secondary | ICD-10-CM | POA: Diagnosis present

## 2021-09-02 DIAGNOSIS — Z9114 Patient's other noncompliance with medication regimen: Secondary | ICD-10-CM

## 2021-09-02 DIAGNOSIS — I429 Cardiomyopathy, unspecified: Secondary | ICD-10-CM

## 2021-09-02 DIAGNOSIS — Z825 Family history of asthma and other chronic lower respiratory diseases: Secondary | ICD-10-CM

## 2021-09-02 DIAGNOSIS — R4701 Aphasia: Secondary | ICD-10-CM | POA: Diagnosis present

## 2021-09-02 DIAGNOSIS — R29810 Facial weakness: Secondary | ICD-10-CM | POA: Diagnosis present

## 2021-09-02 DIAGNOSIS — E1169 Type 2 diabetes mellitus with other specified complication: Secondary | ICD-10-CM | POA: Diagnosis present

## 2021-09-02 DIAGNOSIS — E785 Hyperlipidemia, unspecified: Secondary | ICD-10-CM | POA: Diagnosis present

## 2021-09-02 DIAGNOSIS — R29702 NIHSS score 2: Secondary | ICD-10-CM | POA: Diagnosis present

## 2021-09-02 DIAGNOSIS — E871 Hypo-osmolality and hyponatremia: Secondary | ICD-10-CM | POA: Diagnosis not present

## 2021-09-02 DIAGNOSIS — Z794 Long term (current) use of insulin: Secondary | ICD-10-CM

## 2021-09-02 DIAGNOSIS — R531 Weakness: Secondary | ICD-10-CM | POA: Diagnosis present

## 2021-09-02 DIAGNOSIS — Z8679 Personal history of other diseases of the circulatory system: Secondary | ICD-10-CM

## 2021-09-02 DIAGNOSIS — Z6841 Body Mass Index (BMI) 40.0 and over, adult: Secondary | ICD-10-CM

## 2021-09-02 DIAGNOSIS — E1165 Type 2 diabetes mellitus with hyperglycemia: Secondary | ICD-10-CM | POA: Diagnosis not present

## 2021-09-02 DIAGNOSIS — E119 Type 2 diabetes mellitus without complications: Secondary | ICD-10-CM

## 2021-09-02 DIAGNOSIS — I6389 Other cerebral infarction: Secondary | ICD-10-CM | POA: Diagnosis not present

## 2021-09-02 DIAGNOSIS — Z8632 Personal history of gestational diabetes: Secondary | ICD-10-CM

## 2021-09-02 DIAGNOSIS — Z8261 Family history of arthritis: Secondary | ICD-10-CM

## 2021-09-02 DIAGNOSIS — I5042 Chronic combined systolic (congestive) and diastolic (congestive) heart failure: Secondary | ICD-10-CM | POA: Diagnosis present

## 2021-09-02 DIAGNOSIS — I639 Cerebral infarction, unspecified: Secondary | ICD-10-CM | POA: Diagnosis not present

## 2021-09-02 DIAGNOSIS — Z821 Family history of blindness and visual loss: Secondary | ICD-10-CM

## 2021-09-02 DIAGNOSIS — R4781 Slurred speech: Secondary | ICD-10-CM | POA: Diagnosis present

## 2021-09-02 DIAGNOSIS — Z955 Presence of coronary angioplasty implant and graft: Secondary | ICD-10-CM

## 2021-09-02 DIAGNOSIS — E1159 Type 2 diabetes mellitus with other circulatory complications: Secondary | ICD-10-CM | POA: Diagnosis present

## 2021-09-02 DIAGNOSIS — I10 Essential (primary) hypertension: Secondary | ICD-10-CM | POA: Diagnosis present

## 2021-09-02 DIAGNOSIS — Z823 Family history of stroke: Secondary | ICD-10-CM

## 2021-09-02 LAB — URINALYSIS, ROUTINE W REFLEX MICROSCOPIC
Bilirubin Urine: NEGATIVE
Glucose, UA: >1000 mg/dL — AB
Nitrite: NEGATIVE
Protein, ur: >300 mg/dL — AB
Specific Gravity, Urine: 1.032 — ABNORMAL HIGH (ref 1.005–1.030)
pH: 6 (ref 5.0–8.0)

## 2021-09-02 LAB — RAPID URINE DRUG SCREEN, HOSP PERFORMED
Amphetamines: NOT DETECTED
Barbiturates: NOT DETECTED
Benzodiazepines: NOT DETECTED
Cocaine: NOT DETECTED
Opiates: NOT DETECTED
Tetrahydrocannabinol: NOT DETECTED

## 2021-09-02 LAB — GLUCOSE, CAPILLARY
Glucose-Capillary: 264 mg/dL — ABNORMAL HIGH (ref 70–99)
Glucose-Capillary: 382 mg/dL — ABNORMAL HIGH (ref 70–99)

## 2021-09-02 LAB — PROTIME-INR
INR: 1 (ref 0.8–1.2)
Prothrombin Time: 12.7 seconds (ref 11.4–15.2)

## 2021-09-02 LAB — COMPREHENSIVE METABOLIC PANEL
ALT: 11 U/L (ref 0–44)
AST: 23 U/L (ref 15–41)
Albumin: 3.1 g/dL — ABNORMAL LOW (ref 3.5–5.0)
Alkaline Phosphatase: 97 U/L (ref 38–126)
Anion gap: 10 (ref 5–15)
BUN: 15 mg/dL (ref 6–20)
CO2: 25 mmol/L (ref 22–32)
Calcium: 8.9 mg/dL (ref 8.9–10.3)
Chloride: 94 mmol/L — ABNORMAL LOW (ref 98–111)
Creatinine, Ser: 1.08 mg/dL — ABNORMAL HIGH (ref 0.44–1.00)
GFR, Estimated: >60 mL/min (ref >60)
Glucose, Bld: 512 mg/dL (ref 70–99)
Potassium: 4.3 mmol/L (ref 3.5–5.1)
Sodium: 129 mmol/L — ABNORMAL LOW (ref 135–145)
Total Bilirubin: 0.5 mg/dL (ref 0.3–1.2)
Total Protein: 7.4 g/dL (ref 6.5–8.1)

## 2021-09-02 LAB — CBC
HCT: 42.2 % (ref 36.0–46.0)
Hemoglobin: 14.3 g/dL (ref 12.0–15.0)
MCH: 29.9 pg (ref 26.0–34.0)
MCHC: 33.9 g/dL (ref 30.0–36.0)
MCV: 88.3 fL (ref 80.0–100.0)
Platelets: 379 10*3/uL (ref 150–400)
RBC: 4.78 MIL/uL (ref 3.87–5.11)
RDW: 12.5 % (ref 11.5–15.5)
WBC: 9.7 10*3/uL (ref 4.0–10.5)
nRBC: 0 % (ref 0.0–0.2)

## 2021-09-02 LAB — DIFFERENTIAL
Abs Immature Granulocytes: 0.02 10*3/uL (ref 0.00–0.07)
Basophils Absolute: 0 10*3/uL (ref 0.0–0.1)
Basophils Relative: 0 %
Eosinophils Absolute: 0 10*3/uL (ref 0.0–0.5)
Eosinophils Relative: 0 %
Immature Granulocytes: 0 %
Lymphocytes Relative: 32 %
Lymphs Abs: 3.1 10*3/uL (ref 0.7–4.0)
Monocytes Absolute: 0.5 10*3/uL (ref 0.1–1.0)
Monocytes Relative: 5 %
Neutro Abs: 6 10*3/uL (ref 1.7–7.7)
Neutrophils Relative %: 63 %

## 2021-09-02 LAB — PREGNANCY, URINE: Preg Test, Ur: NEGATIVE

## 2021-09-02 LAB — HIV ANTIBODY (ROUTINE TESTING W REFLEX): HIV Screen 4th Generation wRfx: NONREACTIVE

## 2021-09-02 LAB — TSH: TSH: 2.737 u[IU]/mL (ref 0.350–4.500)

## 2021-09-02 LAB — CBG MONITORING, ED
Glucose-Capillary: 476 mg/dL — ABNORMAL HIGH (ref 70–99)
Glucose-Capillary: 347 mg/dL — ABNORMAL HIGH (ref 70–99)

## 2021-09-02 LAB — RESP PANEL BY RT-PCR (FLU A&B, COVID) ARPGX2
Influenza A by PCR: NEGATIVE
Influenza B by PCR: NEGATIVE
SARS Coronavirus 2 by RT PCR: NEGATIVE

## 2021-09-02 LAB — ETHANOL: Alcohol, Ethyl (B): <10 mg/dL (ref <10)

## 2021-09-02 LAB — APTT: aPTT: 26 seconds (ref 24–36)

## 2021-09-02 MED ORDER — IOHEXOL 350 MG/ML SOLN
100.0000 mL | Freq: Once | INTRAVENOUS | Status: AC | PRN
  Administered 2021-09-02: 100 mL via INTRAVENOUS

## 2021-09-02 MED ORDER — INSULIN ASPART 100 UNIT/ML IJ SOLN
10.0000 [IU] | Freq: Once | INTRAMUSCULAR | Status: AC
  Administered 2021-09-02: 10 [IU] via SUBCUTANEOUS

## 2021-09-02 MED ORDER — SENNOSIDES-DOCUSATE SODIUM 8.6-50 MG PO TABS: 1.0000 | ORAL_TABLET | Freq: Every evening | ORAL | Status: DC | PRN

## 2021-09-02 MED ORDER — ACETAMINOPHEN 160 MG/5ML PO SOLN: 650.0000 mg | ORAL | Status: DC | PRN

## 2021-09-02 MED ORDER — STROKE: EARLY STAGES OF RECOVERY BOOK
Freq: Once | Status: AC
  Filled 2021-09-02: qty 1

## 2021-09-02 MED ORDER — ENOXAPARIN SODIUM 40 MG/0.4ML IJ SOSY
40.0000 mg | PREFILLED_SYRINGE | INTRAMUSCULAR | Status: DC
  Administered 2021-09-03 – 2021-09-04 (×3): 40 mg via SUBCUTANEOUS
  Filled 2021-09-02 (×3): qty 0.4

## 2021-09-02 MED ORDER — ASPIRIN 81 MG PO CHEW
243.0000 mg | CHEWABLE_TABLET | Freq: Once | ORAL | Status: AC
  Administered 2021-09-02: 243 mg via ORAL
  Filled 2021-09-02: qty 3

## 2021-09-02 MED ORDER — INSULIN GLARGINE-YFGN 100 UNIT/ML ~~LOC~~ SOLN
17.0000 [IU] | Freq: Once | SUBCUTANEOUS | Status: AC
  Administered 2021-09-02: 17 [IU] via SUBCUTANEOUS
  Filled 2021-09-02: qty 10

## 2021-09-02 MED ORDER — INSULIN ASPART 100 UNIT/ML IJ SOLN
0.0000 [IU] | Freq: Every day | INTRAMUSCULAR | Status: DC
  Administered 2021-09-02: 5 [IU] via SUBCUTANEOUS
  Administered 2021-09-03: 4 [IU] via SUBCUTANEOUS

## 2021-09-02 MED ORDER — INSULIN GLARGINE-YFGN 100 UNIT/ML ~~LOC~~ SOLN
15.0000 [IU] | Freq: Every day | SUBCUTANEOUS | Status: DC
  Administered 2021-09-02 – 2021-09-03 (×2): 15 [IU] via SUBCUTANEOUS
  Filled 2021-09-02 (×3): qty 0.15

## 2021-09-02 MED ORDER — INSULIN ASPART 100 UNIT/ML IJ SOLN
0.0000 [IU] | Freq: Three times a day (TID) | INTRAMUSCULAR | Status: DC
Start: 2021-09-02 — End: 2021-09-04
  Administered 2021-09-02: 8 [IU] via SUBCUTANEOUS
  Administered 2021-09-03: 3 [IU] via SUBCUTANEOUS
  Administered 2021-09-03 (×2): 11 [IU] via SUBCUTANEOUS
  Administered 2021-09-04: 15 [IU] via SUBCUTANEOUS

## 2021-09-02 MED ORDER — ACETAMINOPHEN 325 MG PO TABS: 650.0000 mg | ORAL_TABLET | ORAL | Status: DC | PRN

## 2021-09-02 MED ORDER — LORAZEPAM 2 MG/ML IJ SOLN: 1.0000 mg | Freq: Once | INTRAMUSCULAR | Status: DC

## 2021-09-02 MED ORDER — ACETAMINOPHEN 650 MG RE SUPP: 650.0000 mg | RECTAL | Status: DC | PRN

## 2021-09-02 MED ORDER — FAMOTIDINE 20 MG PO TABS
20.0000 mg | ORAL_TABLET | Freq: Two times a day (BID) | ORAL | Status: DC
  Administered 2021-09-02 – 2021-09-04 (×5): 20 mg via ORAL
  Filled 2021-09-02 (×5): qty 1

## 2021-09-02 MED ORDER — ASPIRIN 325 MG PO TABS
325.0000 mg | ORAL_TABLET | Freq: Every day | ORAL | Status: DC
  Administered 2021-09-02 – 2021-09-04 (×3): 325 mg via ORAL
  Filled 2021-09-02 (×4): qty 1

## 2021-09-02 MED ORDER — ATORVASTATIN CALCIUM 80 MG PO TABS
80.0000 mg | ORAL_TABLET | Freq: Every day | ORAL | Status: DC
  Administered 2021-09-02 – 2021-09-04 (×3): 80 mg via ORAL
  Filled 2021-09-02 (×3): qty 1

## 2021-09-02 NOTE — Progress Notes (Signed)
Patient arrived to 3w-07, vitals taken, denies pain, patient resting comfortably in bed t his time.
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
## 2021-09-02 NOTE — ED Notes (Signed)
Patient, called, no answer; not seen in lobby

## 2021-09-02 NOTE — Plan of Care (Signed)
°  Problem: Education: Goal: Knowledge of disease or condition will improve Outcome: Progressing Goal: Knowledge of secondary prevention will improve (SELECT ALL) Outcome: Progressing Goal: Knowledge of patient specific risk factors will improve (INDIVIDUALIZE FOR PATIENT) Outcome: Progressing   Problem: Coping: Goal: Will verbalize positive feelings about self Outcome: Progressing   Problem: Health Behavior/Discharge Planning: Goal: Ability to manage health-related needs will improve Outcome: Progressing   Problem: Self-Care: Goal: Ability to participate in self-care as condition permits will improve Outcome: Progressing   Problem: Ischemic Stroke/TIA Tissue Perfusion: Goal: Complications of ischemic stroke/TIA will be minimized Outcome: Progressing

## 2021-09-02 NOTE — ED Notes (Signed)
Report called to Marlene Lard RN, [place] [facility]
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
## 2021-09-02 NOTE — ED Notes (Signed)
CRITICAL VALUE STICKER  CRITICAL VALUE:Glucose 512  RECEIVER (on-site recipient of call):Shawnie Pons, RN  DATE & TIME NOTIFIED: 09-02-2021 1030  MESSENGER (representative from lab):  MD NOTIFIED: Dr. Sherry Ruffing  TIME OF NOTIFICATION:1030  RESPONSE:

## 2021-09-02 NOTE — ED Notes (Signed)
Patient called, no answer, not seen in lobby

## 2021-09-02 NOTE — Progress Notes (Signed)
Plan of Care Note for accepted transfer   Patient: Lauren Delgado MRN: [id number] DOB: 12-03-1985  Received a phone call from Facility: [facility]  Requesting MD: [place] Patient with h/o CAD s/p stent; DM; HSV; HTN; HLD; hypothyroidism; morbid obesity; and chronic diastolic CHF presenting with stroke-like symptoms.  Dr. Rory Percy saw her and she did have a stroke, needs admission.  Had R-sided weakness, dysarthria and went to [facility] and sat all night with head CT that was ok.  Finally LWOS and went to [facility].  Called Code Stroke.  CT/CTA looks different from yesterday with left-sided stroke.  Recommends ASA 325 and permissive HTN.  Needs admission for MRI, echo.  Will give some insulin for glucose 500. Plan of care: The patient will be accepted for admission to telemetry at [facility] when bed is available.  Recommend giving home Lantus dose and 10 units Novolog now and monitor glucose.  If able to pass swallow evaluation, she can eat while pending arrival at [facility].    Nursing staff, Please call the [place] number at the top of Amion at the time of the patient's arrival so that the patient can be paged to the admitting physician.   Carlyon Shadow, M.D. [facility]   Plan of care: The patient will be accepted for admission to Telemetry unit, at [facility].    Author: Karmen Bongo 09/02/2021  For on call review [email or web address].

## 2021-09-02 NOTE — ED Notes (Signed)
EKG given to Dr. Sherry Ruffing CBG results reported to Dr. Sherry Ruffing

## 2021-09-02 NOTE — ED Triage Notes (Signed)
Observed to be normal yesterday at 5 pm

## 2021-09-02 NOTE — ED Notes (Signed)
Patient called, no answer; not seen in lobby.

## 2021-09-03 DIAGNOSIS — I6389 Other cerebral infarction: Secondary | ICD-10-CM | POA: Diagnosis not present

## 2021-09-03 DIAGNOSIS — E1169 Type 2 diabetes mellitus with other specified complication: Secondary | ICD-10-CM | POA: Diagnosis not present

## 2021-09-03 DIAGNOSIS — E039 Hypothyroidism, unspecified: Secondary | ICD-10-CM | POA: Diagnosis not present

## 2021-09-03 DIAGNOSIS — R479 Unspecified speech disturbances: Secondary | ICD-10-CM

## 2021-09-03 DIAGNOSIS — I429 Cardiomyopathy, unspecified: Secondary | ICD-10-CM | POA: Diagnosis not present

## 2021-09-03 DIAGNOSIS — Q2112 Patent foramen ovale: Secondary | ICD-10-CM | POA: Diagnosis not present

## 2021-09-03 DIAGNOSIS — I5042 Chronic combined systolic (congestive) and diastolic (congestive) heart failure: Secondary | ICD-10-CM | POA: Diagnosis not present

## 2021-09-03 DIAGNOSIS — Z20822 Contact with and (suspected) exposure to covid-19: Secondary | ICD-10-CM | POA: Diagnosis not present

## 2021-09-03 DIAGNOSIS — E871 Hypo-osmolality and hyponatremia: Secondary | ICD-10-CM | POA: Diagnosis not present

## 2021-09-03 DIAGNOSIS — I110 Hypertensive heart disease with heart failure: Secondary | ICD-10-CM | POA: Diagnosis not present

## 2021-09-03 DIAGNOSIS — G8191 Hemiplegia, unspecified affecting right dominant side: Secondary | ICD-10-CM | POA: Diagnosis not present

## 2021-09-03 DIAGNOSIS — E1165 Type 2 diabetes mellitus with hyperglycemia: Secondary | ICD-10-CM | POA: Diagnosis not present

## 2021-09-03 DIAGNOSIS — I2510 Atherosclerotic heart disease of native coronary artery without angina pectoris: Secondary | ICD-10-CM | POA: Diagnosis not present

## 2021-09-03 DIAGNOSIS — I252 Old myocardial infarction: Secondary | ICD-10-CM | POA: Diagnosis not present

## 2021-09-03 DIAGNOSIS — R4701 Aphasia: Secondary | ICD-10-CM | POA: Diagnosis present

## 2021-09-03 DIAGNOSIS — I509 Heart failure, unspecified: Secondary | ICD-10-CM | POA: Diagnosis not present

## 2021-09-03 DIAGNOSIS — E669 Obesity, unspecified: Secondary | ICD-10-CM | POA: Diagnosis not present

## 2021-09-03 DIAGNOSIS — I639 Cerebral infarction, unspecified: Secondary | ICD-10-CM | POA: Diagnosis not present

## 2021-09-03 DIAGNOSIS — E785 Hyperlipidemia, unspecified: Secondary | ICD-10-CM | POA: Diagnosis present

## 2021-09-03 DIAGNOSIS — R531 Weakness: Secondary | ICD-10-CM | POA: Diagnosis present

## 2021-09-03 DIAGNOSIS — Z6841 Body Mass Index (BMI) 40.0 and over, adult: Secondary | ICD-10-CM | POA: Diagnosis not present

## 2021-09-03 DIAGNOSIS — E8881 Metabolic syndrome: Secondary | ICD-10-CM | POA: Diagnosis present

## 2021-09-03 LAB — GLUCOSE, CAPILLARY
Glucose-Capillary: 344 mg/dL — ABNORMAL HIGH (ref 70–99)
Glucose-Capillary: 320 mg/dL — ABNORMAL HIGH (ref 70–99)
Glucose-Capillary: 183 mg/dL — ABNORMAL HIGH (ref 70–99)
Glucose-Capillary: 307 mg/dL — ABNORMAL HIGH (ref 70–99)

## 2021-09-03 LAB — HEMOGLOBIN A1C
Hgb A1c MFr Bld: 14.7 % — ABNORMAL HIGH (ref 4.8–5.6)
Mean Plasma Glucose: 375.19 mg/dL

## 2021-09-03 LAB — LIPID PANEL
Cholesterol: 313 mg/dL — ABNORMAL HIGH (ref 0–200)
HDL: 44 mg/dL (ref >40)
LDL Cholesterol: 229 mg/dL — ABNORMAL HIGH (ref 0–99)
Total CHOL/HDL Ratio: 7.1 RATIO
Triglycerides: 199 mg/dL — ABNORMAL HIGH (ref <150)
VLDL: 40 mg/dL (ref 0–40)

## 2021-09-03 LAB — SEDIMENTATION RATE: Sed Rate: 133 mm/hr — ABNORMAL HIGH (ref 0–22)

## 2021-09-03 LAB — C-REACTIVE PROTEIN: CRP: 1.3 mg/dL — ABNORMAL HIGH (ref <1.0)

## 2021-09-03 LAB — ANTITHROMBIN III: AntiThromb III Func: 125 % — ABNORMAL HIGH (ref 75–120)

## 2021-09-03 MED ORDER — GLIPIZIDE ER 5 MG PO TB24
5.0000 mg | ORAL_TABLET | Freq: Every day | ORAL | Status: DC
  Administered 2021-09-04: 5 mg via ORAL
  Filled 2021-09-03 (×2): qty 1

## 2021-09-03 MED ORDER — VALACYCLOVIR HCL 500 MG PO TABS
500.0000 mg | ORAL_TABLET | Freq: Two times a day (BID) | ORAL | Status: DC
  Administered 2021-09-03 – 2021-09-04 (×4): 500 mg via ORAL
  Filled 2021-09-03 (×4): qty 1

## 2021-09-03 MED ORDER — CLOPIDOGREL BISULFATE 75 MG PO TABS
75.0000 mg | ORAL_TABLET | Freq: Every day | ORAL | Status: DC
  Administered 2021-09-03 – 2021-09-04 (×2): 75 mg via ORAL
  Filled 2021-09-03 (×2): qty 1

## 2021-09-03 MED ORDER — METFORMIN HCL 500 MG PO TABS: 1000.0000 mg | ORAL_TABLET | Freq: Two times a day (BID) | ORAL | Status: DC

## 2021-09-03 NOTE — Progress Notes (Signed)
PROGRESS NOTE  Lauren Delgado  DOB: Oct 25, 1985  PCP: Eppie Gibson, MD MWN:[id number]  DOA: 09/02/2021  LOS: 1 day  Hospital Day: 2  Chief Complaint  Patient presents with   Numbness   Aphasia   Brief narrative: Lauren Delgado is a 35 y.o. female with PMH significant for morbid obesity, DM2, HTN, HLD, CHF, CAD, hypothyroidism and noncompliance to medications because of financial issues. Patient started having sudden right-sided weakness, facial droop and trouble with speech around 5 PM on 12/30.  She visited ED at [facility], got a head CT which was normal.  But she did not stay for further evaluation.  After next few hours, she presented herself to ED at [facility] for evaluation. CT scan of head showed subtle left frontal infarct. CT angio of head and neck did not show any emergent large vessel occlusion CT perfusion study showed what appears to be a watershed looking pattern versus artifact bilateral hemispheres. She was seen by teleneurology and recommended transfer to [facility] for further work-up. Admitted to hospitalist service MRI brain showed a small acute left frontal and parietal infarcts. Neurology following  Subjective: Patient was seen and examined this morning.  Sitting up at the edge of the bed.  Not in distress.  Mother at bedside.  Assessment/Plan: Acute left frontal and parietal stroke  -Presented with sudden onset right-sided weakness, facial droop and trouble with speech -Imagings as above. -Stroke work-up in process. -Pending echocardiogram -A1c 14.7, HDL 44, LDL 229 -PT/OT/ST eval pending -Patient reported noncompliance to medication because of financial issues but states she was taking a baby aspirin  Uncontrolled type 2 diabetes mellitus Hyperglycemia -A1c significantly up at 14.7 -Not on any antidiabetic medications -Currently on Semglee 15 units daily, sliding scale insulin with Accu-Cheks. Recent Labs  Lab 09/02/21 1530 09/02/21 1737  09/02/21 2132 09/03/21 0620 09/03/21 1139  GLUCAP 347* 264* 382* 344* 320*   Essential hypertension -Not on meds at home  -Currently permissive hypertension allowed   Hyperlipidemia -HDL 44, LDL significantly elevated to 229. -Statin  Morbid obesity  -Body mass index is 51.21 kg/m. Patient has been advised to make an attempt to improve diet and exercise patterns to aid in weight loss.   Chronic combined systolic and diastolic congestive heart failure -Last echo from 2021 with LVEF 45-50%, grade 2 DD  CAD s/p multivessel PCI -Only taking baby aspirin   Psudohyponatremia -from elevated blood sugars.  Trend Recent Labs  Lab 09/02/21 0934  NA 129*    Mobility: PT eval Living condition: Lives at home Goals of care:   Code Status: Full Code  Nutritional status: Body mass index is 51.21 kg/m.      Diet:  Diet Order             Diet heart healthy/carb modified Room service appropriate? Yes; Fluid consistency: Thin  Diet effective now                  DVT prophylaxis:  enoxaparin (LOVENOX) injection 40 mg Start: 09/02/21 2200   Antimicrobials: None Fluid: None Consultants: Neurology Family Communication: Mother at bedside  Status is: Inpatient  Continue in-hospital care because: Stroke work-up Level of care: Telemetry Medical   Dispo: The patient is from: Home              Anticipated d/c is to: Home in 1 to 2 days              Patient currently is not medically stable  to d/c.   Difficult to place patient No     Infusions:    Scheduled Meds:  aspirin  325 mg Oral Daily   atorvastatin  80 mg Oral Daily   clopidogrel  75 mg Oral Daily   enoxaparin (LOVENOX) injection  40 mg Subcutaneous Q24H   famotidine  20 mg Oral BID   [START ON 09/04/2021] glipiZIDE  5 mg Oral Q breakfast   insulin aspart  0-15 Units Subcutaneous TID WC   insulin aspart  0-5 Units Subcutaneous QHS   insulin glargine-yfgn  15 Units Subcutaneous QHS   LORazepam  1 mg  Intravenous Once   [START ON 09/06/2021] metFORMIN  1,000 mg Oral BID WC    PRN meds: acetaminophen **OR** acetaminophen (TYLENOL) oral liquid 160 mg/5 mL **OR** acetaminophen, senna-docusate   Antimicrobials: Anti-infectives (From admission, onward)    None       Objective: Vitals:   09/03/21 0728 09/03/21 1136  BP: (!) 155/88 (!) 150/83  Pulse: 90 95  Resp: 18 18  Temp: 98.2 F (36.8 C) 97.8 F (36.6 C)  SpO2: 99% 100%    Intake/Output Summary (Last 24 hours) at 09/03/2021 1436 Last data filed at 09/03/2021 1002 Gross per 24 hour  Intake 716 ml  Output --  Net 716 ml   Filed Weights   09/02/21 0924  Weight: 127 kg   Weight change:  Body mass index is 51.21 kg/m.   Physical Exam: General exam: Pleasant, young African-American female.  Not in distress Skin: No rashes, lesions or ulcers. HEENT: Atraumatic, normocephalic, no obvious bleeding Lungs: Clear to auscultation bilaterally CVS: Regular rate and rhythm, no murmur GI/Abd soft, nontender nondistended, bowel sound present CNS: Alert, awake, oriented x3 Psychiatry: Mood appropriate Extremities: No pedal edema, no calf tenderness  Data Review: I have personally reviewed the laboratory data and studies available.  F/u labs ordered Unresulted Labs (From admission, onward)     Start     Ordered   09/09/21 0500  Creatinine, serum  (enoxaparin (LOVENOX)    CrCl >/= 30 ml/min)  Weekly,   R     Comments: while on enoxaparin therapy    09/02/21 1722   09/03/21 1141  Protein C activity  (Hypercoagulable Panel, Comprehensive (PNL))  Once,   R        09/03/21 1141   09/03/21 1141  Protein C, total  (Hypercoagulable Panel, Comprehensive (PNL))  Once,   R        09/03/21 1141   09/03/21 1141  Protein S activity  (Hypercoagulable Panel, Comprehensive (PNL))  Once,   R        09/03/21 1141   09/03/21 1141  Protein S, total  (Hypercoagulable Panel, Comprehensive (PNL))  Once,   R        09/03/21 1141   09/03/21 1141   Lupus anticoagulant panel  (Hypercoagulable Panel, Comprehensive (PNL))  Once,   R        09/03/21 1141   09/03/21 1141  Beta-2-glycoprotein i abs, IgG/M/A  (Hypercoagulable Panel, Comprehensive (PNL))  Once,   R        09/03/21 1141   09/03/21 1141  Homocysteine, serum  (Hypercoagulable Panel, Comprehensive (PNL))  Once,   R        09/03/21 1141   09/03/21 1141  Factor 5 leiden  (Hypercoagulable Panel, Comprehensive (PNL))  Once,   R        09/03/21 1141   09/03/21 1141  Prothrombin gene mutation  (  Hypercoagulable Panel, Comprehensive (PNL))  Once,   R        09/03/21 1141   09/03/21 1141  Cardiolipin antibodies, IgG, IgM, IgA  (Hypercoagulable Panel, Comprehensive (PNL))  Once,   R        09/03/21 1141            Signed, Terrilee Croak, MD [facility] 09/03/2021

## 2021-09-03 NOTE — Evaluation (Signed)
Occupational Therapy Evaluation/Discharge Patient Details Name: Lauren Delgado MRN: [id number] DOB: 04/15/1986 Today's Date: 09/03/2021   History of Present Illness Lauren Delgado is a 36 y.o. female presenting with R sided weakness and impaired speech. 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 PMH: CHF, CAD with 3 stents placed, DM, HSV, HTN   Clinical Impression   PTA, pt completely Independent in all daily tasks and cares for her 35 y/o and 63 y/o children at home. Pt presents now feeling back to baseline without residual deficits that would impact completion of daily tasks. Pt overall Independent for ADLs/mobility without AD in room/in hallway. No observable deficits in coordination, vision or sensation with B UE strength 5/5. No overt LOB noted with functional challenges. No further skilled OT services needed at acute level or on DC. Pt hopeful to be able to discharge home today. OT to sign off.     Recommendations for follow up therapy are one component of a multi-disciplinary discharge planning process, led by the attending physician.  Recommendations may be updated based on patient status, additional functional criteria and insurance authorization.   Follow Up Recommendations  No OT follow up    Assistance Recommended at Discharge PRN  Functional Status Assessment  Patient has not had a recent decline in their functional status  Equipment Recommendations  None recommended by OT    Recommendations for Other Services       Precautions / Restrictions Precautions Precautions: None Restrictions Weight Bearing Restrictions: No      Mobility Bed Mobility Overal bed mobility: Independent                  Transfers Overall transfer level: Independent Equipment used: None                      Balance Overall balance assessment: No apparent balance deficits (not formally assessed)                                         ADL  either performed or assessed with clinical judgement   ADL Overall ADL's : Independent                                       General ADL Comments: able to don socks, mobilize in room and hallway without DME. Pt reports leaving Lauren Delgado, showering and then presenting to Lauren Delgado despite CVA symptoms. Pt appears without residual effects from CVA, no LOB picking up items from floor     Vision Baseline Vision/History: 1 Wears glasses (only for nighttime driving) Ability to See in Adequate Light: 0 Adequate Patient Visual Report: No change from baseline Vision Assessment?: No apparent visual deficits Additional Comments: able to read whiteboard in room easily     Perception     Praxis      Pertinent Vitals/Pain Pain Assessment: No/denies pain     Hand Dominance Right   Extremity/Trunk Assessment Upper Extremity Assessment Upper Extremity Assessment: Overall WFL for tasks assessed   Lower Extremity Assessment Lower Extremity Assessment: Defer to PT evaluation   Cervical / Trunk Assessment Cervical / Trunk Assessment: Normal   Communication Communication Communication: No difficulties   Cognition Arousal/Alertness: Awake/alert Behavior During Therapy: WFL for tasks assessed/performed Overall Cognitive Status: Within Functional Limits for tasks assessed  General Comments  Pt's mom at bedside    Exercises     Shoulder Instructions      Home Living Family/patient expects to be discharged to:: Private residence Living Arrangements: Children Available Help at Discharge: Family Type of Home: Other(Comment) (townhouse) Home Access: Level entry     Home Layout: Two level;Bed/bath upstairs Alternate Level Stairs-Number of Steps: flight   Bathroom Shower/Tub: Teacher, early years/pre: Standard     Home Equipment: None          Prior Functioning/Environment Prior Level of Function :  Independent/Modified Independent;Driving             Mobility Comments: no use of AD ADLs Comments: Independent with ADLs, IADLs, cares for 1 and 6 y/o children        OT Problem List:        OT Treatment/Interventions:      OT Goals(Current goals can be found in the care plan section) Acute Rehab OT Goals Patient Stated Goal: go home today if possible OT Goal Formulation: All assessment and education complete, DC therapy  OT Frequency:     Barriers to D/C:            Co-evaluation              AM-PAC OT "6 Clicks" Daily Activity     Outcome Measure Help from another person eating meals?: None Help from another person taking care of personal grooming?: None Help from another person toileting, which includes using toliet, bedpan, or urinal?: None Help from another person bathing (including washing, rinsing, drying)?: None Help from another person to put on and taking off regular upper body clothing?: None Help from another person to put on and taking off regular lower body clothing?: None 6 Click Score: 24   End of Session Nurse Communication: Mobility status  Activity Tolerance: Patient tolerated treatment well Patient left:  (in hallway with PT)  OT Visit Diagnosis: Other abnormalities of gait and mobility (R26.89)                Time: 6237-6283 OT Time Calculation (min): 10 min Charges:  OT General Charges $OT Visit: 1 Visit OT Evaluation $OT Eval Low Complexity: 1 Low  Malachy Chamber, OTR/L Acute Rehab Services Office: [phone]   Layla Maw 09/03/2021, 11:05 AM

## 2021-09-03 NOTE — Social Work (Signed)
CSW notes that pt does not have insurance at this time. CSW referred pt to financial counseling for further assistance. TOC will continue to follow for further needs.

## 2021-09-03 NOTE — Evaluation (Signed)
Physical Therapy Evaluation and Discharge Patient Details Name: Lauren Delgado MRN: [id number] DOB: 1986-07-10 Today's Date: 09/03/2021  History of Present Illness  Lauren Delgado is a 36 y.o. female presenting 09/02/2021 with R sided weakness and impaired speech. 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 PMH: CHF, CAD with 3 stents placed, DM, HSV, HTN  Clinical Impression  Patient evaluated by Physical Therapy with no further acute PT needs identified. Pt overall is mobilizing well. Ambulating 400 feet with no assistive device and negotiated 2 steps without physical difficulty. HR up to 121 bpm. No focal deficits noted in strength. Pt does display decreased gait speed and cardiopulmonary endurance in regards to her age. Education provided regarding AHA recommendations for physical activity. All education has been completed and the patient has no further questions. No follow-up Physical Therapy or equipment needs. PT is signing off. Thank you for this referral.      Recommendations for follow up therapy are one component of a multi-disciplinary discharge planning process, led by the attending physician.  Recommendations may be updated based on patient status, additional functional criteria and insurance authorization.  Follow Up Recommendations No PT follow up    Assistance Recommended at Discharge None  Functional Status Assessment Patient has not had a recent decline in their functional status  Equipment Recommendations  None recommended by PT    Recommendations for Other Services       Precautions / Restrictions Precautions Precautions: None Restrictions Weight Bearing Restrictions: No      Mobility  Bed Mobility Overal bed mobility: Independent                  Transfers Overall transfer level: Independent Equipment used: None                    Ambulation/Gait Ambulation/Gait assistance: Modified independent (Device/Increase time) Gait  Distance (Feet): 400 Feet Assistive device: None Gait Pattern/deviations: Step-through pattern;Decreased stride length;Wide base of support       General Gait Details: Slower pace for age  19 Stairs: Yes Stairs assistance: Independent Stair Management: No rails Number of Stairs: 2 General stair comments: step over step pattern  Wheelchair Mobility    Modified Rankin (Stroke Patients Only) Modified Rankin (Stroke Patients Only) Pre-Morbid Rankin Score: No significant disability Modified Rankin: No significant disability     Balance Overall balance assessment: No apparent balance deficits (not formally assessed)                               Standardized Balance Assessment Standardized Balance Assessment : Dynamic Gait Index   Dynamic Gait Index Level Surface: Mild Impairment Change in Gait Speed: Mild Impairment Gait with Horizontal Head Turns: Normal Gait with Vertical Head Turns: Normal Gait and Pivot Turn: Normal Step Over Obstacle: Normal Step Around Obstacles: Normal Steps: Normal Total Score: 22       Pertinent Vitals/Pain Pain Assessment: No/denies pain    Home Living Family/patient expects to be discharged to:: Private residence Living Arrangements: Children Available Help at Discharge: Family Type of Home: Other(Comment) (townhouse) Home Access: Level entry     Alternate Level Stairs-Number of Steps: flight Home Layout: Two level;Bed/bath upstairs Home Equipment: None      Prior Function Prior Level of Function : Independent/Modified Independent;Driving             Mobility Comments: no use of AD ADLs Comments: Independent with ADLs, IADLs, cares  for 1 and 6 y/o children     Hand Dominance   Dominant Hand: Right    Extremity/Trunk Assessment   Upper Extremity Assessment Upper Extremity Assessment: Defer to OT evaluation    Lower Extremity Assessment Lower Extremity Assessment: RLE deficits/detail;LLE  deficits/detail RLE Deficits / Details: Strength 5/5 LLE Deficits / Details: Strength 5/5    Cervical / Trunk Assessment Cervical / Trunk Assessment: Normal  Communication   Communication: No difficulties  Cognition Arousal/Alertness: Awake/alert Behavior During Therapy: WFL for tasks assessed/performed Overall Cognitive Status: Within Functional Limits for tasks assessed                                          General Comments General comments (skin integrity, edema, etc.): Pt's mom at bedside    Exercises     Assessment/Plan    PT Assessment Patient does not need any further PT services  PT Problem List         PT Treatment Interventions      PT Goals (Current goals can be found in the Care Plan section)  Acute Rehab PT Goals Patient Stated Goal: go home PT Goal Formulation: All assessment and education complete, DC therapy    Frequency     Barriers to discharge        Co-evaluation               AM-PAC PT "6 Clicks" Mobility  Outcome Measure Help needed turning from your back to your side while in a flat bed without using bedrails?: None Help needed moving from lying on your back to sitting on the side of a flat bed without using bedrails?: None Help needed moving to and from a bed to a chair (including a wheelchair)?: None Help needed standing up from a chair using your arms (e.g., wheelchair or bedside chair)?: None Help needed to walk in hospital room?: None Help needed climbing 3-5 steps with a railing? : None 6 Click Score: 24    End of Session   Activity Tolerance: Patient tolerated treatment well Patient left: in bed;with call bell/phone within reach;with family/visitor present   PT Visit Diagnosis: Other symptoms and signs involving the nervous system (S56.812)    Time: 7517-0017 PT Time Calculation (min) (ACUTE ONLY): 17 min   Charges:   PT Evaluation $PT Eval Low Complexity: [place], PT,  DPT Acute Rehabilitation Services Pager [phone] Office [phone]   Deno Etienne 09/03/2021, 12:32 PM

## 2021-09-03 NOTE — Plan of Care (Signed)
°  Problem: Education: Goal: Knowledge of disease or condition will improve Outcome: Progressing Goal: Knowledge of secondary prevention will improve (SELECT ALL) Outcome: Progressing Goal: Knowledge of patient specific risk factors will improve (INDIVIDUALIZE FOR PATIENT) Outcome: Progressing   Problem: Coping: Goal: Will verbalize positive feelings about self Outcome: Progressing   Problem: Health Behavior/Discharge Planning: Goal: Ability to manage health-related needs will improve Outcome: Progressing   Problem: Self-Care: Goal: Ability to participate in self-care as condition permits will improve Outcome: Progressing   Problem: Ischemic Stroke/TIA Tissue Perfusion: Goal: Complications of ischemic stroke/TIA will be minimized Outcome: Progressing

## 2021-09-03 NOTE — Progress Notes (Addendum)
STROKE TEAM PROGRESS NOTE   INTERVAL HISTORY Her mom is at the bedside.  Numbness in face improving. Non compliant with medications. Difficulty with medication adherence and motivation. TOC consult placed. Hypercoagulability panel sent.  Consider TEE and Loop recorder prior to discharge.   Vitals:   09/02/21 2210 09/03/21 0000 09/03/21 0305 09/03/21 0728  BP: (!) 168/90 128/81 (!) 138/97 (!) 155/88  Pulse:    90  Resp: 16 16 14 18   Temp: 98.2 F (36.8 C) 98.7 F (37.1 C) 97.8 F (36.6 C) 98.2 F (36.8 C)  TempSrc: Oral Oral Oral Oral  SpO2: 100% 100% 99% 99%  Weight:      Height:       CBC:  Recent Labs  Lab 09/02/21 0934  WBC 9.7  NEUTROABS 6.0  HGB 14.3  HCT 42.2  MCV 88.3  PLT 676   Basic Metabolic Panel:  Recent Labs  Lab 09/02/21 0934  NA 129*  K 4.3  CL 94*  CO2 25  GLUCOSE 512*  BUN 15  CREATININE 1.08*  CALCIUM 8.9   Lipid Panel:  Recent Labs  Lab 09/03/21 0139  CHOL 313*  TRIG 199*  HDL 44  CHOLHDL 7.1  VLDL 40  LDLCALC 229*   HgbA1c:  Recent Labs  Lab 09/03/21 0139  HGBA1C 14.7*   Urine Drug Screen:  Recent Labs  Lab 09/02/21 1015  LABOPIA NONE DETECTED  COCAINSCRNUR NONE DETECTED  LABBENZ NONE DETECTED  AMPHETMU NONE DETECTED  THCU NONE DETECTED  LABBARB NONE DETECTED    Alcohol Level  Recent Labs  Lab 09/02/21 0934  ETH <10    IMAGING past 24 hours MR BRAIN WO CONTRAST  Result Date: 09/02/2021 CLINICAL DATA:  Neuro deficit, acute, stroke suspected. Right-sided weakness, speech disturbance, and facial droop. EXAM: MRI HEAD WITHOUT CONTRAST TECHNIQUE: Multiplanar, multiecho pulse sequences of the brain and surrounding structures were obtained without intravenous contrast. COMPARISON:  Head and neck CTA and CTP 09/02/2021 FINDINGS: Brain: There are small acute cortical and subcortical infarcts in the left frontal and parietal lobes (MCA distribution). There is some associated petechial hemorrhage in the left frontal lobe. The  brain is normal in signal elsewhere. No mass, midline shift, or extra-axial fluid collection is evident. The ventricles are normal in size. Vascular: Major intracranial vascular flow voids are preserved. Skull and upper cervical spine: Unremarkable bone marrow signal. Sinuses/Orbits: Unremarkable orbits. Paranasal sinuses and mastoid air cells are clear. Other: None. 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 Electronically Signed   By: Logan Bores M.D.   On: 09/02/2021 19:55   CT ANGIO HEAD NECK W WO CM W PERF (CODE STROKE)  Result Date: 09/02/2021 CLINICAL DATA:  Stroke-like symptoms. EXAM: CT ANGIOGRAPHY HEAD AND NECK CT PERFUSION BRAIN TECHNIQUE: Multidetector CT imaging of the head and neck was performed using the standard protocol during bolus administration of intravenous contrast. Multiplanar CT image reconstructions and MIPs were obtained to evaluate the vascular anatomy. Carotid stenosis measurements (when applicable) are obtained utilizing NASCET criteria, using the distal internal carotid diameter as the denominator. Multiphase CT imaging of the brain was performed following IV bolus contrast injection. Subsequent parametric perfusion maps were calculated using RAPID software. CONTRAST:  179mL OMNIPAQUE IOHEXOL 350 MG/ML SOLN COMPARISON:  Noncontrast head CT from today FINDINGS: CTA NECK FINDINGS Aortic arch: Minimal coverage is negative Right carotid system: Low-density atheromatous type wall thickening at the report right bifurcation with proximal ICA stenosis measuring 40%. No ulceration or beading. Left carotid system:  Vessels are smooth and widely patent. Vertebral arteries: Proximal subclavian arteries are smooth and widely patent. Dominant right vertebral artery. Both vertebral arteries are smoothly contoured and widely patent. Skeleton: No acute finding Other neck: Subcentimeter bilateral thyroid nodules. No followup recommended (ref: J Am Coll Radiol. 2015 Feb;12(2):  143-50). Upper chest: biapical reticulonodular opacity with calcification, chronic by prior dedicated chest CT. Review of the MIP images confirms the above findings CTA HEAD FINDINGS Anterior circulation: Mild atheromatous plaque on the carotid siphons. No major branch occlusion, beading, or aneurysm. Azygous A2. Posterior circulation: Vessels are smooth and widely patent. No branch occlusion, beading, or aneurysm. Venous sinuses: Unremarkable in the arterial phase. Anatomic variants: As above Review of the MIP images confirms the above findings CT Brain Perfusion Findings: ASPECTS: 9 CBF (<30%) Volume: 19mL Perfusion (Tmax>6.0s) volume: 14mL-but non anatomic in distribution given the history and CTA findings IMPRESSION: 1. No emergent large vessel occlusion. 2. Noncontributory CT perfusion. 3. 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 Electronically Signed   By: Jorje Guild M.D.   On: 09/02/2021 10:20    PHYSICAL EXAM  Physical Exam  Constitutional: Appears well-developed and well-nourished.  Psych: Affect appropriate to situation Eyes: No scleral injection HENT: No OP obstrucion MSK: no joint deformities.  Cardiovascular: Normal rate and regular rhythm.  Respiratory: Effort normal, non-labored breathing GI: Soft.  No distension. There is no tenderness.  Skin: WDI  Neuro: Mental Status: Patient is awake, alert, oriented to person, place, month, year, and situation. Patient is able to give a clear and coherent history. No signs of aphasia or neglect Cranial Nerves: II: Visual Fields are full. Pupils are equal, round, and reactive to light.   III,IV, VI: EOMI without ptosis or diploplia.  V: Facial sensation is symmetric to temperature VII: Facial movement is symmetric resting and smiling VIII: Hearing is intact to voice X: Palate elevates symmetrically XI: Shoulder shrug is symmetric. XII: Tongue protrudes midline without atrophy or fasciculations.   Motor: Tone is normal. Bulk is normal. 5/5 strength was present in all four extremities. She states that she feels weaker in her RLE Sensory: Sensation is symmetric to light touch and temperature in the arms and legs. No extinction to DSS present.  Cerebellar: FNF and HKS are intact bilaterally Gait: Deferred  ASSESSMENT/PLAN [name] is a 36 y.o. female with history of extensive cardiac history and noncompliant to medications, presenting for evaluation of dizziness, right sided weakness worse in the leg than arm which is intermittent as well as slurred speech and word finding difficulty-the word finding difficulty has resolved but speech still remained slurred in the ED, noted to have a possible left frontal area of evolving infarct in comparison to head CT that was done yesterday when she had initially presented but did not complete evaluation.  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 ASPECTS 9 CTA head & neck did not show any emergent large vessel occlusion, right proximal ICA stenosis measuring 40% CT perfusion watershed looking pattern versus artifact bilateral hemispheres. MRI  small acute left frontal and parietal infarcts. 2D Echo pending 2021- EF 45-50%, decreased LV function LDL 229 HgbA1c 14.7 VTE prophylaxis - Lovenox 40 aspirin 81 mg daily prior to admission, now on aspirin 325 mg daily and clopidogrel 75 mg daily.  Therapy recommendations:  No follow up recommended Disposition:  Pending  Hypertension Home meds:  benazepril,  nifedipine- not currently taking Unstable Permissive hypertension (OK if < 220/120) but gradually normalize in 5-7 days Long-term BP goal normotensive  Hyperlipidemia Home meds:  none LDL 229, goal < 70 Add atorvastatin 80mg   High intensity statin not indicated Continue statin at discharge  Diabetes type II Uncontrolled Home meds:   Insulin non compliant HgbA1c 14.7, goal < 7.0 CBGs Recent Labs    09/02/21 1737 09/02/21 2132 09/03/21 0620  GLUCAP 264* 382* 344*    SSI  Other Stroke Risk Factors Obesity, Body mass index is 51.21 kg/m., BMI >/= 30 associated with increased stroke risk, recommend weight loss, diet and exercise as appropriate  Coronary artery disease Heart Failure Multivessel PCI- sees Dr. Haroldine Laws and Dr. Debara Pickett outpatient  Most recent echo - 2021- LVEF 45-50%    Other Active Problems Socioeconomic constraint TOC consult placed Hyponatremia Regulate blood glucose Daily labs  Hospital day # 1  Patient seen and examined by NP/APP with MD. MD to update note as needed.   Lauren Ores, DNP, FNP-BC [facility] Pager: [phone]  ATTENDING ATTESTATION: Non complaint with home medications. Left frontal/parietal CVA. Young stroke workup pending.   Dr. Reeves Forth evaluated pt independently, reviewed imaging, chart, labs. Discussed and formulated plan with the APP. Please see APP note above for details.   Total 30 minutes spent on counseling patient and coordinating care, writing notes and reviewing chart.  Ova Gillentine,MD    To contact Stroke Continuity provider, please refer to [email or web address] After hours, contact General Neurology
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
## 2021-09-04 ENCOUNTER — Inpatient Hospital Stay (HOSPITAL_COMMUNITY): Payer: Medicaid Other

## 2021-09-04 DIAGNOSIS — I6389 Other cerebral infarction: Secondary | ICD-10-CM

## 2021-09-04 DIAGNOSIS — I639 Cerebral infarction, unspecified: Secondary | ICD-10-CM

## 2021-09-04 LAB — GLUCOSE, CAPILLARY
Glucose-Capillary: 358 mg/dL — ABNORMAL HIGH (ref 70–99)
Glucose-Capillary: 139 mg/dL — ABNORMAL HIGH (ref 70–99)
Glucose-Capillary: 92 mg/dL (ref 70–99)
Glucose-Capillary: 200 mg/dL — ABNORMAL HIGH (ref 70–99)
Glucose-Capillary: 146 mg/dL — ABNORMAL HIGH (ref 70–99)

## 2021-09-04 LAB — ECHOCARDIOGRAM COMPLETE
AR max vel: 2.32 cm2
AV Area VTI: 1.98 cm2
AV Area mean vel: 2.12 cm2
AV Mean grad: 3.5 mmHg
AV Peak grad: 5.9 mmHg
Ao pk vel: 1.21 m/s
Area-P 1/2: 4.57 cm2
Calc EF: 45.5 %
Height: 62 in
S' Lateral: 4.1 cm
Single Plane A2C EF: 54.5 %
Single Plane A4C EF: 30.8 %
Weight: 4479.75 oz

## 2021-09-04 MED ORDER — INSULIN ASPART PROT & ASPART (70-30 MIX) 100 UNIT/ML ~~LOC~~ SUSP: 10.0000 [IU] | Freq: Two times a day (BID) | SUBCUTANEOUS | Status: DC

## 2021-09-04 MED ORDER — INSULIN ASPART 100 UNIT/ML IJ SOLN: 0.0000 [IU] | Freq: Every day | INTRAMUSCULAR | Status: DC

## 2021-09-04 MED ORDER — INSULIN ASPART PROT & ASPART (70-30 MIX) 100 UNIT/ML ~~LOC~~ SUSP
7.0000 [IU] | Freq: Two times a day (BID) | SUBCUTANEOUS | Status: DC
  Administered 2021-09-04: 7 [IU] via SUBCUTANEOUS
  Filled 2021-09-04: qty 10

## 2021-09-04 MED ORDER — LIVING WELL WITH DIABETES BOOK
Freq: Once | Status: AC
  Filled 2021-09-04: qty 1

## 2021-09-04 MED ORDER — SODIUM CHLORIDE 0.9 % IV SOLN: INTRAVENOUS | Status: DC

## 2021-09-04 MED ORDER — INSULIN STARTER KIT- PEN NEEDLES (ENGLISH)
1.0000 | Freq: Once | Status: AC
  Administered 2021-09-04: 1
  Filled 2021-09-04: qty 1

## 2021-09-04 MED ORDER — INSULIN ASPART 100 UNIT/ML IJ SOLN
0.0000 [IU] | Freq: Three times a day (TID) | INTRAMUSCULAR | Status: DC
  Administered 2021-09-04: 1 [IU] via SUBCUTANEOUS
  Administered 2021-09-05: 2 [IU] via SUBCUTANEOUS

## 2021-09-04 MED ORDER — INSULIN ASPART PROT & ASPART (70-30 MIX) 100 UNIT/ML ~~LOC~~ SUSP
15.0000 [IU] | Freq: Two times a day (BID) | SUBCUTANEOUS | Status: DC
  Administered 2021-09-04: 15 [IU] via SUBCUTANEOUS
  Filled 2021-09-04: qty 10

## 2021-09-04 NOTE — Progress Notes (Signed)
°  Echocardiogram 2D Echocardiogram has been performed.  Fidel Levy 09/04/2021, 11:04 AM
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
## 2021-09-04 NOTE — Progress Notes (Signed)
TCD bubbles completed with Dr. Leonie Man. Refer to "CV Proc" under chart review to view preliminary results.  09/04/2021 3:38 PM Kelby Aline., MHA, RVT, RDCS, RDMS

## 2021-09-04 NOTE — Plan of Care (Signed)
Nutrition Education Note  RD consulted for nutrition education regarding a Heart Healthy Carb Modified diet.   Lab Results  Component Value Date   HGBA1C 14.7 (H) 09/03/2021   Lipid Panel     Component Value Date/Time   CHOL 313 (H) 09/03/2021 0139   CHOL 125 01/21/2018 1116   TRIG 199 (H) 09/03/2021 0139   HDL 44 09/03/2021 0139   HDL 50 01/21/2018 1116   CHOLHDL 7.1 09/03/2021 0139   VLDL 40 09/03/2021 0139   LDLCALC 229 (H) 09/03/2021 0139   LDLCALC 64 01/21/2018 1116   Pt out of room at time of RD visit. RD provided "Heart Healthy Consistent Carbohydrate Nutrition Therapy" handout from the Academy of Nutrition and Dietetics and left it on pt's bedside table. Handout provides examples on ways to decrease sodium and fat intake in diet, discusses different food groups and their effects on blood sugar, provides list of carbohydrates and recommended serving sizes of common foods, discusses importance of controlled and consistent carbohydrate intake throughout the day. Note that pt has had education from this clinical nutrition team on multiple occassions, but RD will follow-up as able to reinforce education and answer any potential questions from pt.   Expect poor compliance.  Body mass index is 51.21 kg/m. Pt meets criteria for morbid obesity based on current BMI.  Current diet order is heart healthy/carb modified, patient is consuming approximately 90% of meals at this time. Labs and medications reviewed. No further nutrition interventions warranted at this time. RD contact information provided. If additional nutrition issues arise, please re-consult RD.   Theone Stanley., MS, RD, LDN (she/her/hers) RD pager number and weekend/on-call pager number located in [place].

## 2021-09-04 NOTE — Progress Notes (Addendum)
STROKE TEAM PROGRESS NOTE   INTERVAL HISTORY Patient is sitting up comfortably in bed.  She states her numbness continues to improve.  She has some intermittent right facial droop when she is attempting to talk fast.  No new complaints.  Vital signs stable.   Echocardiogram shows reduced ejection fraction of 45 to 50% with mild global hypokinesis.  Telemetry monitoring does not show cardiac arrhythmia so far Vitals:   09/04/21 0120 09/04/21 0541 09/04/21 0811 09/04/21 1217  BP: (!) 143/87 (!) 158/86 (!) 146/105 (!) 147/98  Pulse:    (!) 105  Resp: 16 16 16 16   Temp: 98.7 F (37.1 C) 98.6 F (37 C) 98.3 F (36.8 C) 98.6 F (37 C)  TempSrc: Oral Oral Oral Oral  SpO2: 99% 98% 99% 100%  Weight:      Height:       CBC:  Recent Labs  Lab 09/02/21 0934  WBC 9.7  NEUTROABS 6.0  HGB 14.3  HCT 42.2  MCV 88.3  PLT 824   Basic Metabolic Panel:  Recent Labs  Lab 09/02/21 0934  NA 129*  K 4.3  CL 94*  CO2 25  GLUCOSE 512*  BUN 15  CREATININE 1.08*  CALCIUM 8.9   Lipid Panel:  Recent Labs  Lab 09/03/21 0139  CHOL 313*  TRIG 199*  HDL 44  CHOLHDL 7.1  VLDL 40  LDLCALC 229*   HgbA1c:  Recent Labs  Lab 09/03/21 0139  HGBA1C 14.7*   Urine Drug Screen:  Recent Labs  Lab 09/02/21 1015  LABOPIA NONE DETECTED  COCAINSCRNUR NONE DETECTED  LABBENZ NONE DETECTED  AMPHETMU NONE DETECTED  THCU NONE DETECTED  LABBARB NONE DETECTED    Alcohol Level  Recent Labs  Lab 09/02/21 0934  ETH <10    IMAGING past 24 hours ECHOCARDIOGRAM COMPLETE  Result Date: 09/04/2021    ECHOCARDIOGRAM REPORT   Patient Name:   Lauren Delgado Date of Exam: 09/04/2021 Medical Rec #:  [id number]          Height:       62.0 in Accession #:    [id number]         Weight:       280.0 lb Date of Birth:  02-08-1986           BSA:          2.206 m Patient Age:    35 years           BP:           158/86 mmHg Patient Gender: F                  HR:           95 bpm. Exam Location:  Inpatient Procedure:  2D Echo, Cardiac Doppler and Color Doppler Indications:    Stroke I63.9  History:        Patient has prior history of Echocardiogram examinations, most                 recent 06/01/2020. CHF, Previous Myocardial Infarction and CAD;                 Risk Factors:Hypertension, Dyslipidemia and Diabetes.  Sonographer:    Beryle Beams RDCS Referring Phys: [place]  1. Left ventricular ejection fraction, by estimation, is 45 to 50%. The left ventricle has mildly decreased function. The left ventricle demonstrates global hypokinesis. Left ventricular diastolic parameters are consistent with Grade  I diastolic dysfunction (impaired relaxation).  2. Right ventricular systolic function is normal. The right ventricular size is normal. Tricuspid regurgitation signal is inadequate for assessing PA pressure.  3. The mitral valve is normal in structure. Trivial mitral valve regurgitation. No evidence of mitral stenosis.  4. The aortic valve has an indeterminant number of cusps. Aortic valve regurgitation is not visualized. No aortic stenosis is present.  5. IVC is small, suggesting low RA pressure and hypovolemia. FINDINGS  Left Ventricle: Left ventricular ejection fraction, by estimation, is 45 to 50%. The left ventricle has mildly decreased function. The left ventricle demonstrates global hypokinesis. The left ventricular internal cavity size was normal in size. There is  no left ventricular hypertrophy. Left ventricular diastolic parameters are consistent with Grade I diastolic dysfunction (impaired relaxation). Normal left ventricular filling pressure. Right Ventricle: The right ventricular size is normal. No increase in right ventricular wall thickness. Right ventricular systolic function is normal. Tricuspid regurgitation signal is inadequate for assessing PA pressure. Left Atrium: Left atrial size was normal in size. Right Atrium: Right atrial size was normal in size. Pericardium: There is no evidence  of pericardial effusion. Mitral Valve: The mitral valve is normal in structure. Trivial mitral valve regurgitation. No evidence of mitral valve stenosis. Tricuspid Valve: The tricuspid valve is normal in structure. Tricuspid valve regurgitation is not demonstrated. No evidence of tricuspid stenosis. Aortic Valve: The aortic valve has an indeterminant number of cusps. Aortic valve regurgitation is not visualized. No aortic stenosis is present. Aortic valve mean gradient measures 3.5 mmHg. Aortic valve peak gradient measures 5.9 mmHg. Aortic valve area, by VTI measures 1.98 cm. Pulmonic Valve: The pulmonic valve was not well visualized. Pulmonic valve regurgitation is not visualized. No evidence of pulmonic stenosis. Aorta: The aortic root is normal in size and structure. Venous: IVC is small, suggesting low RA pressure and hypovolemia. IAS/Shunts: No atrial level shunt detected by color flow Doppler.  LEFT VENTRICLE PLAX 2D LVIDd:         5.10 cm      Diastology LVIDs:         4.10 cm      LV e' medial:    7.34 cm/s LV PW:         1.00 cm      LV E/e' medial:  12.8 LV IVS:        1.00 cm      LV e' lateral:   11.60 cm/s LVOT diam:     1.90 cm      LV E/e' lateral: 8.1 LV SV:         43 LV SV Index:   20 LVOT Area:     2.84 cm  LV Volumes (MOD) LV vol d, MOD A2C: 146.0 ml LV vol d, MOD A4C: 115.0 ml LV vol s, MOD A2C: 66.5 ml LV vol s, MOD A4C: 79.6 ml LV SV MOD A2C:     79.5 ml LV SV MOD A4C:     115.0 ml LV SV MOD BP:      62.3 ml RIGHT VENTRICLE RV S prime:     13.10 cm/s TAPSE (M-mode): 1.6 cm LEFT ATRIUM           Index        RIGHT ATRIUM          Index LA diam:      3.40 cm 1.54 cm/m   RA Area:     6.58 cm LA Vol (A2C): 46.2 ml  20.95 ml/m  RA Volume:   9.23 ml  4.18 ml/m LA Vol (A4C): 50.0 ml 22.67 ml/m  AORTIC VALVE                    PULMONIC VALVE AV Area (Vmax):    2.32 cm     PV Vmax:       0.76 m/s AV Area (Vmean):   2.12 cm     PV Vmean:      48.200 cm/s AV Area (VTI):     1.98 cm     PV VTI:         0.145 m AV Vmax:           121.19 cm/s  PV Peak grad:  2.3 mmHg AV Vmean:          88.605 cm/s  PV Mean grad:  1.0 mmHg AV VTI:            0.217 m AV Peak Grad:      5.9 mmHg AV Mean Grad:      3.5 mmHg LVOT Vmax:         99.00 cm/s LVOT Vmean:        66.200 cm/s LVOT VTI:          0.152 m LVOT/AV VTI ratio: 0.70  AORTA Ao Root diam: 2.40 cm Ao Asc diam:  2.80 cm MITRAL VALVE MV Area (PHT): 4.57 cm     SHUNTS MV Decel Time: 166 msec     Systemic VTI:  0.15 m MV E velocity: 94.10 cm/s   Systemic Diam: 1.90 cm MV A velocity: 104.00 cm/s MV E/A ratio:  0.90 Carlyle Dolly MD Electronically signed by Carlyle Dolly MD Signature Date/Time: 09/04/2021/11:39:15 AM    Final     PHYSICAL EXAM  Physical Exam  Constitutional: Obese young African-American lady not in distress Psych: Affect appropriate to situation Eyes: No scleral injection HENT: No OP obstrucion MSK: no joint deformities.  Cardiovascular: Normal rate and regular rhythm.  Respiratory: Effort normal, non-labored breathing GI: Soft.  No distension. There is no tenderness.  Skin: WDI  Neurological Exam ;  Awake  Alert oriented x 3. Normal speech and language.eye movements full without nystagmus.fundi were not visualized. Vision acuity and fields appear normal. Hearing is normal. Palatal movements are normal. Face symmetric. Tongue midline. Normal strength, tone, reflexes and coordination. Normal sensation. Gait deferred.  ASSESSMENT/PLAN Ms. RAELEA GOSSE is a 36 y.o. female with history of extensive cardiac history and noncompliant to medications, presenting for evaluation of dizziness, right sided weakness worse in the leg than arm which is intermittent as well as slurred speech and word finding difficulty-the word finding difficulty has resolved but speech still remained slurred in the ED, noted to have a possible left frontal area of evolving infarct in comparison to head CT that was done yesterday when she had initially  presented but did not complete evaluation.  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 ASPECTS 9 CTA head & neck did not show any emergent large vessel occlusion, right proximal ICA stenosis measuring 40% CT perfusion watershed looking pattern versus artifact bilateral hemispheres. MRI  small acute left frontal and parietal infarcts. 2D Echo :EF 45-50%, decreased LV function.  Mild global hypokinesis LDL 229 HgbA1c 14.7 VTE prophylaxis - Lovenox 40 aspirin 81 mg daily prior to admission, now on aspirin 325 mg daily and clopidogrel  75 mg daily. X 3 weeks followed by Plavix alone Therapy recommendations:  No follow up recommended Disposition:  Pending  Hypertension Home meds:  benazepril, nifedipine- not currently taking Unstable Permissive hypertension (OK if < 220/120) but gradually normalize in 5-7 days Long-term BP goal normotensive  Hyperlipidemia Home meds:  none LDL 229, goal < 70 Add atorvastatin 80mg   High intensity statin not indicated Continue statin at discharge  Diabetes type II Uncontrolled Home meds:  Insulin non compliant HgbA1c 14.7, goal < 7.0 CBGs Recent Labs    09/03/21 2125 09/04/21 0700 09/04/21 1214  GLUCAP 307* 358* 139*    SSI  Other Stroke Risk Factors Obesity, Body mass index is 51.21 kg/m., BMI >/= 30 associated with increased stroke risk, recommend weight loss, diet and exercise as appropriate  Coronary artery disease Heart Failure Multivessel PCI- sees Dr. Haroldine Laws and Dr. Debara Pickett outpatient  Most recent echo - 2021- LVEF 45-50%    Other Active Problems Socioeconomic constraint TOC consult placed Hyponatremia Regulate blood glucose Daily labs  Hospital day # 2  I have personally obtained history,examined this patient, reviewed notes, independently viewed imaging studies, participated in medical decision making and plan of  care.ROS completed by me personally and pertinent positives fully documented  I have made any additions or clarifications directly to the above note.  Recommend check TCD bubble study for PFO today and TEE tomorrow.  Follow hypercoagulable panel labs.  Continue aspirin and Plavix for 3 weeks followed by Plavix alone.  Greater than 50% time during this 35-minute visit was spent in counseling and coordination of care and discussion with patient and care team and answering questions.  [name], MD Medical Director [facility] Pager: [phone] 09/04/2021 1:25 PM       To contact Stroke Continuity provider, please refer to [email or web address] After hours, contact General Neurology

## 2021-09-04 NOTE — Progress Notes (Signed)
PROGRESS NOTE  Lauren Delgado  DOB: [birth year]  PCP: Eppie Gibson, MD YWV:[id number]  DOA: 09/02/2021  LOS: 2 days  Hospital Day: 3  Chief Complaint  Patient presents with   Numbness   Aphasia   Brief narrative: Lauren Delgado is a 36 y.o. female with PMH significant for morbid obesity, DM2, HTN, HLD, CHF, CAD, hypothyroidism and noncompliance to medications because of financial issues. Patient started having sudden right-sided weakness, facial droop and trouble with speech around 5 PM on 12/30.  She visited ED at [facility], got a head CT which was normal.  But she did not stay for further evaluation.  After next few hours, she presented herself to ED at [facility] for evaluation. CT scan of head showed subtle left frontal infarct. CT angio of head and neck did not show any emergent large vessel occlusion CT perfusion study showed what appears to be a watershed looking pattern versus artifact bilateral hemispheres. She was seen by teleneurology and recommended transfer to [facility] for further work-up. Admitted to hospitalist service MRI brain showed a small acute left frontal and parietal infarcts. Neurology following  Subjective: Patient was seen and examined this morning.  Looking around the room.  Not in distress.  No new symptoms.  Family not at bedside today.    Assessment/Plan: Acute left frontal and parietal stroke  -Presented with sudden onset right-sided weakness, facial droop and trouble with speech -Imagings as above. -Stroke work-up in process. -echocardiogram with EF 45 to 50%, global hypokinesis, grade 1 diastolic dysfunction -W5I 14.7, HDL 44, LDL 229 -PT/OT/ST eval obtained.  No follow-up. -Patient reported noncompliance to medication because of financial issues but states she was taking a baby aspirin -Per neurology condition, and she is currently on aspirin 325 mg daily and Plavix 75 mg daily for 3 weeks followed by Plavix alone. -Continue  statin. -Noted a plan to do TEE tomorrow.  Uncontrolled type 2 diabetes mellitus Hyperglycemia -A1c significantly up at 14.7 -Not on any antidiabetic medications -Patient has no medical insurance and is at risk of noncompliance with medications.  To minimize her current financial burden, I chose to start her on insulin 70/30 at 15 units twice daily.  Also started on glipizide.  Plan to start on metformin as an outpatient. Recent Labs  Lab 09/03/21 1139 09/03/21 1608 09/03/21 2125 09/04/21 0700 09/04/21 1214  GLUCAP 320* 183* 307* 57* 139*   Essential hypertension -Not on meds at home  -Currently permissive hypertension allowed   Hyperlipidemia -HDL 44, LDL significantly elevated to 229. -Statin  Morbid obesity  -Body mass index is 51.21 kg/m. Patient has been advised to make an attempt to improve diet and exercise patterns to aid in weight loss.   Chronic combined systolic and diastolic congestive heart failure -Last echo from 2021 with LVEF 45-50%, grade 2 DD  CAD s/p multivessel PCI -Only taking baby aspirin   Mobility: PT eval Living condition: Lives at home Goals of care:   Code Status: Full Code  Nutritional status: Body mass index is 51.21 kg/m.      Diet:  Diet Order             Diet heart healthy/carb modified Room service appropriate? Yes with Assist; Fluid consistency: Thin  Diet effective now                  DVT prophylaxis:  enoxaparin (LOVENOX) injection 40 mg Start: 09/02/21 2200   Antimicrobials: None Fluid: None Consultants: Neurology Family Communication:  Mother at bedside  Status is: Inpatient  Continue in-hospital care because: Stroke work-up, TEE tomorrow Level of care: Telemetry Medical   Dispo: The patient is from: Home              Anticipated d/c is to: Home in 1 to 2 days              Patient currently is not medically stable to d/c.   Difficult to place patient No     Infusions:    Scheduled Meds:  aspirin   325 mg Oral Daily   atorvastatin  80 mg Oral Daily   clopidogrel  75 mg Oral Daily   enoxaparin (LOVENOX) injection  40 mg Subcutaneous Q24H   famotidine  20 mg Oral BID   glipiZIDE  5 mg Oral Q breakfast   insulin aspart  0-5 Units Subcutaneous QHS   insulin aspart  0-9 Units Subcutaneous TID WC   insulin aspart protamine- aspart  15 Units Subcutaneous BID WC   LORazepam  1 mg Intravenous Once   [START ON 09/06/2021] metFORMIN  1,000 mg Oral BID WC   valACYclovir  500 mg Oral BID    PRN meds: acetaminophen **OR** acetaminophen (TYLENOL) oral liquid 160 mg/5 mL **OR** acetaminophen, senna-docusate   Antimicrobials: Anti-infectives (From admission, onward)    Start     Dose/Rate Route Frequency Ordered Stop   09/03/21 1545  valACYclovir (VALTREX) tablet 500 mg        500 mg Oral 2 times daily 09/03/21 1449         Objective: Vitals:   09/04/21 0811 09/04/21 1217  BP: (!) 146/105 (!) 147/98  Pulse:  (!) 105  Resp: 16 16  Temp: 98.3 F (36.8 C) 98.6 F (37 C)  SpO2: 99% 100%   No intake or output data in the 24 hours ending 09/04/21 1356  Filed Weights   09/02/21 0924  Weight: 127 kg   Weight change:  Body mass index is 51.21 kg/m.   Physical Exam: General exam: Pleasant, young African-American female.  Not in distress.  No new symptoms Skin: No rashes, lesions or ulcers. HEENT: Atraumatic, normocephalic, no obvious bleeding Lungs: Clear to auscultation bilaterally CVS: Regular rate and rhythm, no murmur GI/Abd soft, nontender nondistended, bowel sound present CNS: Alert, awake, oriented x3 Psychiatry: Mood appropriate Extremities: No pedal edema, no calf tenderness  Data Review: I have personally reviewed the laboratory data and studies available.  F/u labs ordered Unresulted Labs (From admission, onward)     Start     Ordered   09/09/21 0500  Creatinine, serum  (enoxaparin (LOVENOX)    CrCl >/= 30 ml/min)  Weekly,   R     Comments: while on enoxaparin  therapy    09/02/21 1722   09/03/21 1141  Protein C activity  (Hypercoagulable Panel, Comprehensive (PNL))  Once,   R        09/03/21 1141   09/03/21 1141  Protein C, total  (Hypercoagulable Panel, Comprehensive (PNL))  Once,   R        09/03/21 1141   09/03/21 1141  Protein S activity  (Hypercoagulable Panel, Comprehensive (PNL))  Once,   R        09/03/21 1141   09/03/21 1141  Protein S, total  (Hypercoagulable Panel, Comprehensive (PNL))  Once,   R        09/03/21 1141   09/03/21 1141  Lupus anticoagulant panel  (Hypercoagulable Panel, Comprehensive (PNL))  Once,  R        09/03/21 1141   09/03/21 1141  Beta-2-glycoprotein i abs, IgG/M/A  (Hypercoagulable Panel, Comprehensive (PNL))  Once,   R        09/03/21 1141   09/03/21 1141  Homocysteine, serum  (Hypercoagulable Panel, Comprehensive (PNL))  Once,   R        09/03/21 1141   09/03/21 1141  Factor 5 leiden  (Hypercoagulable Panel, Comprehensive (PNL))  Once,   R        09/03/21 1141   09/03/21 1141  Prothrombin gene mutation  (Hypercoagulable Panel, Comprehensive (PNL))  Once,   R        09/03/21 1141   09/03/21 1141  Cardiolipin antibodies, IgG, IgM, IgA  (Hypercoagulable Panel, Comprehensive (PNL))  Once,   R        09/03/21 1141            Signed, Terrilee Croak, MD [facility] 09/04/2021

## 2021-09-05 ENCOUNTER — Inpatient Hospital Stay (HOSPITAL_COMMUNITY): Payer: Medicaid Other | Admitting: Certified Registered"

## 2021-09-05 ENCOUNTER — Encounter (HOSPITAL_COMMUNITY): Payer: Self-pay | Admitting: Internal Medicine

## 2021-09-05 ENCOUNTER — Encounter (HOSPITAL_COMMUNITY)
Admission: EM | Disposition: A | Discharge status: Home / Self Care | Payer: Self-pay | Source: Home / Self Care | Attending: Internal Medicine

## 2021-09-05 ENCOUNTER — Other Ambulatory Visit (HOSPITAL_COMMUNITY): Payer: Self-pay

## 2021-09-05 ENCOUNTER — Inpatient Hospital Stay (HOSPITAL_COMMUNITY): Payer: Medicaid Other

## 2021-09-05 HISTORY — PX: TEE WITHOUT CARDIOVERSION: SHX5443

## 2021-09-05 HISTORY — PX: BUBBLE STUDY: SHX6837

## 2021-09-05 LAB — BETA-2-GLYCOPROTEIN I ABS, IGG/M/A
Beta-2 Glyco I IgG: <9 GPI IgG units (ref 0–20)
Beta-2-Glycoprotein I IgA: <9 GPI IgA units (ref 0–25)
Beta-2-Glycoprotein I IgM: 20 GPI IgM units (ref 0–32)

## 2021-09-05 LAB — GLUCOSE, CAPILLARY: Glucose-Capillary: 158 mg/dL — ABNORMAL HIGH (ref 70–99)

## 2021-09-05 LAB — HOMOCYSTEINE: Homocysteine: 11.7 umol/L (ref 0.0–14.5)

## 2021-09-05 SURGERY — ECHOCARDIOGRAM, TRANSESOPHAGEAL
Anesthesia: Monitor Anesthesia Care

## 2021-09-05 MED ORDER — INSULIN PEN NEEDLE 32G X 4 MM MISC
0 refills | Status: DC
  Filled 2021-09-05: qty 100, 25d supply, fill #0

## 2021-09-05 MED ORDER — INSULIN ISOPHANE & REGULAR (HUMAN 70-30)100 UNIT/ML KWIKPEN
PEN_INJECTOR | SUBCUTANEOUS | 0 refills | Status: DC
  Filled 2021-09-05: qty 6, 28d supply, fill #0
  Filled 2021-10-02 (×2): qty 6, 28d supply, fill #1
  Filled 2021-10-04: qty 6, 28d supply, fill #0
  Filled 2021-10-05: qty 6, 30d supply, fill #0
  Filled 2021-11-07: qty 6, 30d supply, fill #1

## 2021-09-05 MED ORDER — SODIUM CHLORIDE 0.9 % IV SOLN
INTRAVENOUS | Status: AC | PRN
  Administered 2021-09-05: 500 mL via INTRAVENOUS

## 2021-09-05 MED ORDER — LISINOPRIL 10 MG PO TABS
10.0000 mg | ORAL_TABLET | Freq: Every day | ORAL | 2 refills | Status: DC
  Filled 2021-09-05: qty 30, 30d supply, fill #0
  Filled 2021-10-02 (×2): qty 30, 30d supply, fill #1

## 2021-09-05 MED ORDER — INSULIN ASPART PROT & ASPART (70-30 MIX) 100 UNIT/ML ~~LOC~~ SUSP: 10.0000 [IU] | Freq: Two times a day (BID) | SUBCUTANEOUS | Status: DC

## 2021-09-05 MED ORDER — GLIPIZIDE ER 5 MG PO TB24
5.0000 mg | ORAL_TABLET | Freq: Every day | ORAL | 2 refills | Status: DC
Start: 2021-09-05 — End: 2021-11-30
  Filled 2021-09-05: qty 30, 30d supply, fill #0
  Filled 2021-10-02 (×2): qty 30, 30d supply, fill #1
  Filled 2021-10-29: qty 30, 30d supply, fill #2

## 2021-09-05 MED ORDER — ASPIRIN 325 MG PO TABS
325.0000 mg | ORAL_TABLET | Freq: Every day | ORAL | 0 refills | Status: AC
  Filled 2021-09-05: qty 21, 21d supply, fill #0

## 2021-09-05 MED ORDER — AMLODIPINE BESYLATE 5 MG PO TABS
5.0000 mg | ORAL_TABLET | Freq: Every day | ORAL | 2 refills | Status: DC
  Filled 2021-09-05: qty 30, 30d supply, fill #0
  Filled 2021-10-02 (×2): qty 30, 30d supply, fill #1

## 2021-09-05 MED ORDER — BUTAMBEN-TETRACAINE-BENZOCAINE 2-2-14 % EX AERO
INHALATION_SPRAY | CUTANEOUS | Status: DC | PRN
  Administered 2021-09-05: 2 via TOPICAL

## 2021-09-05 MED ORDER — ATORVASTATIN CALCIUM 80 MG PO TABS
80.0000 mg | ORAL_TABLET | Freq: Every day | ORAL | 2 refills | Status: DC
  Filled 2021-09-05: qty 30, 30d supply, fill #0
  Filled 2021-10-02 (×2): qty 30, 30d supply, fill #1
  Filled 2021-10-29: qty 30, 30d supply, fill #2

## 2021-09-05 MED ORDER — LIDOCAINE HCL (CARDIAC) PF 100 MG/5ML IV SOSY
PREFILLED_SYRINGE | INTRAVENOUS | Status: DC | PRN
  Administered 2021-09-05: 100 mg via INTRAVENOUS

## 2021-09-05 MED ORDER — PROPOFOL 500 MG/50ML IV EMUL
INTRAVENOUS | Status: DC | PRN
  Administered 2021-09-05: 125 ug/kg/min via INTRAVENOUS

## 2021-09-05 MED ORDER — METFORMIN HCL 1000 MG PO TABS
1000.0000 mg | ORAL_TABLET | Freq: Two times a day (BID) | ORAL | 2 refills | Status: DC
  Filled 2021-09-05: qty 60, 30d supply, fill #0

## 2021-09-05 MED ORDER — PROPOFOL 10 MG/ML IV BOLUS
INTRAVENOUS | Status: DC | PRN
Start: 2021-09-05 — End: 2021-09-05
  Administered 2021-09-05: 10 mg via INTRAVENOUS
  Administered 2021-09-05: 20 mg via INTRAVENOUS

## 2021-09-05 MED ORDER — CLOPIDOGREL BISULFATE 75 MG PO TABS
75.0000 mg | ORAL_TABLET | Freq: Every day | ORAL | 2 refills | Status: DC
  Filled 2021-09-05: qty 30, 30d supply, fill #0
  Filled 2021-10-02 (×2): qty 30, 30d supply, fill #1
  Filled 2021-10-29: qty 30, 30d supply, fill #2

## 2021-09-05 MED ORDER — GLYCOPYRROLATE 0.2 MG/ML IJ SOLN
INTRAMUSCULAR | Status: DC | PRN
Start: 2021-09-05 — End: 2021-09-05
  Administered 2021-09-05 (×2): .1 mg via INTRAVENOUS

## 2021-09-05 NOTE — Anesthesia Procedure Notes (Signed)
Procedure Name: MAC Date/Time: 09/05/2021 11:58 AM Performed by: Barrington Ellison, CRNA Pre-anesthesia Checklist: Patient identified, Emergency Drugs available, Suction available and Patient being monitored Patient Re-evaluated:Patient Re-evaluated prior to induction Oxygen Delivery Method: Simple face mask

## 2021-09-05 NOTE — Progress Notes (Signed)
°  Echocardiogram Echocardiogram Transesophageal has been performed.  Lauren Delgado 09/05/2021, 12:32 PM

## 2021-09-05 NOTE — Progress Notes (Signed)
[facility] night cross cover note:  I was contacted by RN with concern regarding rate of decline of the patient's blood sugar, noting interval decrease in CBG from 300-92 following initiation of 70/30 insulin earlier today, at rate of 15 units twice daily, in this patient who has exhibited decline in oral intake as well as with impending n.p.o. status starting at midnight.  In light of this history and these factors, I have placed order to reduce dose of 70/30 insulin to 7 units subcu twice daily, with first administration of this reduced dose to occur this evening (09/04/21).     Babs Bertin, DO Hospitalist

## 2021-09-05 NOTE — Discharge Summary (Signed)
Physician Discharge Summary  Lauren Delgado:[id number] DOB: 1986-02-17 DOA: 09/02/2021  PCP: Eppie Gibson, MD  Admit date: 09/02/2021 Discharge date: 09/05/2021  Admitted From: Home Discharge disposition: Home   Code Status: Full Code   Discharge Diagnosis:   Principal Problem:   Acute CVA (cerebrovascular accident) [facility]) Active Problems:   Type 2 diabetes mellitus without complication ([place])   Hyperlipidemia associated with type 2 diabetes mellitus ([place])   Abdominal obesity and metabolic syndrome   Cardiomyopathy ([place])   Status post coronary artery stent placement   Hypertension    Chief Complaint  Patient presents with   Numbness   Aphasia   Brief narrative: Lauren Delgado is a 36 y.o. female with PMH significant for morbid obesity, DM2, HTN, HLD, CHF, CAD, hypothyroidism and noncompliance to medications because of financial issues. Patient started having sudden right-sided weakness, facial droop and trouble with speech around 5 PM on 12/30.  She visited ED at [facility], got a head CT which was normal.  But she did not stay for further evaluation.  After next few hours, she presented herself to ED at [facility] for evaluation. CT scan of head showed subtle left frontal infarct. CT angio of head and neck did not show any emergent large vessel occlusion CT perfusion study showed what appears to be a watershed looking pattern versus artifact bilateral hemispheres. She was seen by teleneurology and recommended transfer to [facility] for further work-up. Admitted to hospitalist service MRI brain showed a small acute left frontal and parietal infarcts. Neurology following  Subjective: Patient was seen and examined this morning.   Not in distress. Underwent TEE today.  Hospital course: Acute left frontal and parietal stroke  -Presented with sudden onset right-sided weakness, facial droop and trouble with speech -Imagings as above. -Stroke work-up in  process. -echocardiogram with EF 45 to 50%, global hypokinesis, grade 1 diastolic dysfunction -W1X 14.7, HDL 44, LDL 229 -PT/OT/ST eval obtained.  No follow-up. -Patient reported noncompliance to medication because of financial issues but states she was taking a baby aspirin -Per neurology condition, and she is currently on aspirin 325 mg daily and Plavix 75 mg daily for 3 weeks followed by Plavix alone. -Continue statin. -Underwent TEE today.  No cardiac source of embolism. -Hypercoagulability work-up sent by neurology.  Pending report.  Follow-up with neurology as an outpatient.  Uncontrolled type 2 diabetes mellitus Hyperglycemia -A1c significantly up at 14.7 -Not on any antidiabetic medications -Patient has no medical insurance and is at risk of noncompliance with medications.  To minimize her current financial burden, I chose to start her on insulin 70/30 at 10units twice daily.  Also started on glipizide and metformin. Recent Labs  Lab 09/04/21 1214 09/04/21 1551 09/04/21 1948 09/04/21 2311 09/05/21 0623  GLUCAP 139* 92 200* 146* 158*   Chronic combined systolic and diastolic congestive heart failure Essential hypertension -LVEF 45-50%, grade 2 DD -Euvolemic without diuretics. -Blood pressure elevated to 150s.  Started the patient on amlodipine and lisinopril.  CAD s/p multivessel PCI -Plan of antiplatelet as above.  Hyperlipidemia -HDL 44, LDL significantly elevated to 229. -Statin  Morbid obesity  -Body mass index is 51.21 kg/m. Patient has been advised to make an attempt to improve diet and exercise patterns to aid in weight loss.    Mobility: PT eval obtained. Living condition: Lives at home Goals of care:   Code Status: Full Code  Nutritional status: Body mass index is 51.21 kg/m.      Discharge  Medications:   Allergies as of 09/05/2021   No Known Allergies      Medication List     STOP taking these medications    Adult One Daily Gummies Chew    aspirin 81 MG chewable tablet Replaced by: aspirin 325 MG tablet   benazepril 10 MG tablet Commonly known as: LOTENSIN   cyclobenzaprine 5 MG tablet Commonly known as: FLEXERIL   famotidine 20 MG tablet Commonly known as: PEPCID   furosemide 40 MG tablet Commonly known as: LASIX   ibuprofen 600 MG tablet Commonly known as: ADVIL   insulin glargine 100 UNIT/ML injection Commonly known as: Lantus   Insulin Pen Needle 31G X 4 MM Misc   labetalol 200 MG tablet Commonly known as: NORMODYNE   NIFEdipine 60 MG 24 hr tablet Commonly known as: PROCARDIA XL/NIFEDICAL XL   nitroGLYCERIN 0.4 MG SL tablet Commonly known as: NITROSTAT   ondansetron 4 MG tablet Commonly known as: ZOFRAN   pantoprazole 20 MG tablet Commonly known as: PROTONIX   tiZANidine 4 MG tablet Commonly known as: ZANAFLEX   valACYclovir 500 MG tablet Commonly known as: VALTREX   Victoza 18 MG/3ML Sopn Generic drug: liraglutide       TAKE these medications    amLODipine 5 MG tablet Commonly known as: NORVASC Take 1 tablet (5 mg total) by mouth daily.   aspirin 325 MG tablet Take 1 tablet (325 mg total) by mouth daily for 21 days. Replaces: aspirin 81 MG chewable tablet   atorvastatin 80 MG tablet Commonly known as: LIPITOR Take 1 tablet (80 mg total) by mouth daily.   clopidogrel 75 MG tablet Commonly known as: PLAVIX Take 1 tablet (75 mg total) by mouth daily.   glipiZIDE 5 MG 24 hr tablet Commonly known as: GLUCOTROL XL Take 1 tablet (5 mg total) by mouth daily with breakfast.   insulin NPH-regular Human (70-30) 100 UNIT/ML injection 10 units in am and 10 units in pm. Please provide pen needles as well. For 3 months supply   lisinopril 10 MG tablet Commonly known as: ZESTRIL Take 1 tablet (10 mg total) by mouth daily.   metFORMIN 1000 MG tablet Commonly known as: GLUCOPHAGE Take 1 tablet (1,000 mg total) by mouth 2 (two) times daily with a meal. Start taking on: September 09, 2021        Wound care:   Incision (Closed) 05/30/20 Abdomen Other (Comment) (Active)  Date First Assessed/Time First Assessed: 05/30/20 1114   Location: Abdomen  Location Orientation: Other (Comment)    Assessments 05/30/2020 11:34 AM 06/01/2020  8:15 AM  Dressing Type Negative pressure wound therapy Negative pressure wound therapy  Dressing Clean;Dry;Intact Clean;Dry;Intact  Dressing Change Frequency -- PRN  Site / Wound Assessment Dressing in place / Unable to assess Dry;Clean  Margins -- Attached edges (approximated)  Closure Other (Comment) Sutures  Drainage Amount None Scant  Treatment Negative pressure wound therapy Negative pressure wound therapy     No Linked orders to display    Discharge Instructions:   Discharge Instructions     Call MD for:  difficulty breathing, headache or visual disturbances   Complete by: As directed    Call MD for:  extreme fatigue   Complete by: As directed    Call MD for:  hives   Complete by: As directed    Call MD for:  persistant dizziness or light-headedness   Complete by: As directed    Call MD for:  persistant nausea and vomiting  Complete by: As directed    Call MD for:  severe uncontrolled pain   Complete by: As directed    Call MD for:  temperature >100.4   Complete by: As directed    Diet general   Complete by: As directed    Discharge instructions   Complete by: As directed    Discharge instructions for diabetes mellitus: Check blood sugar 3 times a day and bedtime at home. If blood sugar running above 200 or less than 70 please call your MD to adjust insulin. If you notice signs and symptoms of hypoglycemia (low blood sugar) like jitteriness, confusion, thirst, tremor and sweating, please check blood sugar, drink sugary drink/biscuits/sweets to increase sugar level and call MD or return to ER.    Discharge instructions for CHF Check weight daily -preferably same time every day. Restrict fluid intake to 1200 ml  daily Restrict salt intake to less than 2 g daily. Call MD if you have one of the following symptoms 1) 3 pound weight gain in 24 hours or 5 pounds in 1 week  2) swelling in the hands, feet or stomach  3) progressive shortness of breath 4) if you have to sleep on extra pillows at night in order to breathe     General discharge instructions:  Follow with Primary MD Eppie Gibson, MD in 7 days   Get CBC/BMP checked in next visit within 1 week by PCP or SNF MD. (We routinely change or add medications that can affect your baseline labs and fluid status, therefore we recommend that you get the mentioned basic workup next visit with your PCP, your PCP may decide not to get them or add new tests based on their clinical decision)  On your next visit with your PCP, please get your medicines reviewed and adjusted.  Please request your PCP  to go over all hospital tests, procedures, radiology results at the follow up, please get all Hospital records sent to your PCP by signing hospital release before you go home.  Activity: As tolerated with Full fall precautions use walker/cane & assistance as needed  Avoid using any recreational substances like cigarette, tobacco, alcohol, or non-prescribed drug.  If you experience worsening of your admission symptoms, develop shortness of breath, life threatening emergency, suicidal or homicidal thoughts you must seek medical attention immediately by calling 911 or calling your MD immediately  if symptoms less severe.  You must read complete instructions/literature along with all the possible adverse reactions/side effects for all the medicines you take and that have been prescribed to you. Take any new medicine only after you have completely understood and accepted all the possible adverse reactions/side effects.   Do not drive, operate heavy machinery, perform activities at heights, swimming or participation in water activities or provide baby sitting  services if your were admitted for syncope or siezures until you have seen by Primary MD or a Neurologist and advised to do so again.  Do not drive when taking Pain medications.  Do not take more than prescribed Pain, Sleep and Anxiety Medications  Wear Seat belts while driving.  Please note You were cared for by a hospitalist during your hospital stay. If you have any questions about your discharge medications or the care you received while you were in the hospital after you are discharged, you can call the unit and asked to speak with the hospitalist on call if the hospitalist that took care of you is not available. Once you are discharged,  your primary care physician will handle any further medical issues. Please note that NO REFILLS for any discharge medications will be authorized once you are discharged, as it is imperative that you return to your primary care physician (or establish a relationship with a primary care physician if you do not have one) for your aftercare needs so that they can reassess your need for medications and monitor your lab values.   Increase activity slowly   Complete by: As directed        Follow ups:    Follow-up Information     [name], Nadean Corwin, MD .   Specialty: Cardiology Contact information: [address] [place] [address] [place] Follow up.   Why: [address] Use this location for your pharmacy needs. Contact information: [place] [address] [place], [facility]. Schedule an appointment as soon as possible for a visit in 1 week(s).   Why: If this is more convenient for you please call to schedule an appointment Contact information: [place] [address] [phone]         [facility] Follow up on 10/04/2021.   Why: Your  appointment is at 1:30 pm. Please arrive early and bring a picture ID and your current medications. Contact information: [place] [address] [phone]        Eppie Gibson, MD Follow up.   Specialty: Family Medicine Contact information: [place] [address] [phone]         Pixie Casino, MD .   Specialty: Cardiology Contact information: [address] [place] [address] [phone]                 Discharge Exam:   Vitals:   09/05/21 0405 09/05/21 0803 09/05/21 1125 09/05/21 1224  BP: 126/67 (!) 148/86 (!) 165/90 (!) 106/25  Pulse: 98 98 97 92  Resp: 18 18 17  (!) 25  Temp: 98.7 F (37.1 C) 98.1 F (36.7 C) (!) 96.9 F (36.1 C) (P) 98.2 F (36.8 C)  TempSrc: Oral Oral Temporal (P) Oral  SpO2: 100% 100% 100% 100%  Weight:      Height:        Body mass index is 51.21 kg/m.  General exam: Pleasant, young African-American female.  Not in distress.  No new symptoms Skin: No rashes, lesions or ulcers. HEENT: Atraumatic, normocephalic, no obvious bleeding Lungs: Clear to auscultation bilaterally CVS: Regular rate and rhythm, no murmur GI/Abd soft, nontender nondistended, bowel sound present CNS: Alert, awake, oriented x3 Psychiatry: Mood appropriate Extremities: No pedal edema, no calf tenderness  Time coordinating discharge: 35 minutes   The results of significant diagnostics from this hospitalization (including imaging, microbiology, ancillary and laboratory) are listed below for reference.    Procedures and Diagnostic Studies:   MR BRAIN WO CONTRAST  Result Date: 09/02/2021 CLINICAL DATA:  Neuro deficit, acute, stroke suspected. Right-sided weakness, speech disturbance, and facial droop. EXAM: MRI HEAD WITHOUT CONTRAST TECHNIQUE: Multiplanar, multiecho pulse sequences of the brain and surrounding structures were obtained without intravenous contrast.  COMPARISON:  Head and neck CTA and CTP 09/02/2021 FINDINGS: Brain: There are small acute cortical and subcortical infarcts in the left frontal and parietal lobes (MCA distribution). There is some associated  petechial hemorrhage in the left frontal lobe. The brain is normal in signal elsewhere. No mass, midline shift, or extra-axial fluid collection is evident. The ventricles are normal in size. Vascular: Major intracranial vascular flow voids are preserved. Skull and upper cervical spine: Unremarkable bone marrow signal. Sinuses/Orbits: Unremarkable orbits. Paranasal sinuses and mastoid air cells are clear. Other: None. 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 Electronically Signed   By: Logan Bores M.D.   On: 09/02/2021 19:55   CT ANGIO HEAD NECK W WO CM W PERF (CODE STROKE)  Result Date: 09/02/2021 CLINICAL DATA:  Stroke-like symptoms. EXAM: CT ANGIOGRAPHY HEAD AND NECK CT PERFUSION BRAIN TECHNIQUE: Multidetector CT imaging of the head and neck was performed using the standard protocol during bolus administration of intravenous contrast. Multiplanar CT image reconstructions and MIPs were obtained to evaluate the vascular anatomy. Carotid stenosis measurements (when applicable) are obtained utilizing NASCET criteria, using the distal internal carotid diameter as the denominator. Multiphase CT imaging of the brain was performed following IV bolus contrast injection. Subsequent parametric perfusion maps were calculated using RAPID software. CONTRAST:  177mL OMNIPAQUE IOHEXOL 350 MG/ML SOLN COMPARISON:  Noncontrast head CT from today FINDINGS: CTA NECK FINDINGS Aortic arch: Minimal coverage is negative Right carotid system: Low-density atheromatous type wall thickening at the report right bifurcation with proximal ICA stenosis measuring 40%. No ulceration or beading. Left carotid system: Vessels are smooth and widely patent. Vertebral arteries: Proximal subclavian arteries are smooth and  widely patent. Dominant right vertebral artery. Both vertebral arteries are smoothly contoured and widely patent. Skeleton: No acute finding Other neck: Subcentimeter bilateral thyroid nodules. No followup recommended (ref: J Am Coll Radiol. 2015 Feb;12(2): 143-50). Upper chest: biapical reticulonodular opacity with calcification, chronic by prior dedicated chest CT. Review of the MIP images confirms the above findings CTA HEAD FINDINGS Anterior circulation: Mild atheromatous plaque on the carotid siphons. No major branch occlusion, beading, or aneurysm. Azygous A2. Posterior circulation: Vessels are smooth and widely patent. No branch occlusion, beading, or aneurysm. Venous sinuses: Unremarkable in the arterial phase. Anatomic variants: As above Review of the MIP images confirms the above findings CT Brain Perfusion Findings: ASPECTS: 9 CBF (<30%) Volume: 11mL Perfusion (Tmax>6.0s) volume: 40mL-but non anatomic in distribution given the history and CTA findings IMPRESSION: 1. No emergent large vessel occlusion. 2. Noncontributory CT perfusion. 3. 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 Electronically Signed   By: Jorje Guild M.D.   On: 09/02/2021 10:20     Labs:   Basic Metabolic Panel: Recent Labs  Lab 09/02/21 0934  NA 129*  K 4.3  CL 94*  CO2 25  GLUCOSE 512*  BUN 15  CREATININE 1.08*  CALCIUM 8.9   GFR Estimated Creatinine Clearance: 93.7 mL/min (A) (by C-G formula based on SCr of 1.08 mg/dL (H)). Liver Function Tests: Recent Labs  Lab 09/02/21 0934  AST 23  ALT 11  ALKPHOS 97  BILITOT 0.5  PROT 7.4  ALBUMIN 3.1*   No results for input(s): LIPASE, AMYLASE in the last 168 hours. No results for input(s): AMMONIA in the last 168 hours. Coagulation profile Recent Labs  Lab 09/02/21 0934  INR 1.0    CBC: Recent Labs  Lab 09/02/21 0934  WBC 9.7  NEUTROABS 6.0  HGB 14.3  HCT 42.2  MCV 88.3  PLT 379   Cardiac Enzymes: No results  for input(s): CKTOTAL, CKMB, CKMBINDEX, TROPONINI in the last 168 hours. BNP: Invalid input(s): POCBNP CBG:  Recent Labs  Lab 09/04/21 1214 09/04/21 1551 09/04/21 1948 09/04/21 2311 09/05/21 0623  GLUCAP 139* 92 200* 146* 158*   D-Dimer No results for input(s): DDIMER in the last 72 hours. Hgb A1c Recent Labs    09/03/21 0139  HGBA1C 14.7*   Lipid Profile Recent Labs    09/03/21 0139  CHOL 313*  HDL 44  LDLCALC 229*  TRIG 199*  CHOLHDL 7.1   Thyroid function studies Recent Labs    09/02/21 1735  TSH 2.737   Anemia work up No results for input(s): VITAMINB12, FOLATE, FERRITIN, TIBC, IRON, RETICCTPCT in the last 72 hours. Microbiology Recent Results (from the past 240 hour(s))  Resp Panel by RT-PCR (Flu A&B, Covid) Nasopharyngeal Swab     Status: None   Collection Time: 09/02/21 10:15 AM   Specimen: Nasopharyngeal Swab; Nasopharyngeal(NP) swabs in vial transport medium  Result Value Ref Range Status   SARS Coronavirus 2 by RT PCR NEGATIVE NEGATIVE Final    Comment: (NOTE) SARS-CoV-2 target nucleic acids are NOT DETECTED.  The SARS-CoV-2 RNA is generally detectable in upper respiratory specimens during the acute phase of infection. The lowest concentration of SARS-CoV-2 viral copies this assay can detect is 138 copies/mL. A negative result does not preclude SARS-Cov-2 infection and should not be used as the sole basis for treatment or other patient management decisions. A negative result may occur with  improper specimen collection/handling, submission of specimen other than nasopharyngeal swab, presence of viral mutation(s) within the areas targeted by this assay, and inadequate number of viral copies(<138 copies/mL). A negative result must be combined with clinical observations, patient history, and epidemiological information. The expected result is Negative.  Fact Sheet for Patients:  EntrepreneurPulse.com.au  Fact Sheet for Healthcare  Providers:  IncredibleEmployment.be  This test is no t yet approved or cleared by the [place] FDA and  has been authorized for detection and/or diagnosis of SARS-CoV-2 by FDA under an Emergency Use Authorization (EUA). This EUA will remain  in effect (meaning this test can be used) for the duration of the COVID-19 declaration under Section 564(b)(1) of the Act, 21 U.S.C.section 360bbb-3(b)(1), unless the authorization is terminated  or revoked sooner.       Influenza A by PCR NEGATIVE NEGATIVE Final   Influenza B by PCR NEGATIVE NEGATIVE Final    Comment: (NOTE) The Xpert Xpress SARS-CoV-2/FLU/RSV plus assay is intended as an aid in the diagnosis of influenza from Nasopharyngeal swab specimens and should not be used as a sole basis for treatment. Nasal washings and aspirates are unacceptable for Xpert Xpress SARS-CoV-2/FLU/RSV testing.  Fact Sheet for Patients: EntrepreneurPulse.com.au  Fact Sheet for Healthcare Providers: IncredibleEmployment.be  This test is not yet approved or cleared by the [place] FDA and has been authorized for detection and/or diagnosis of SARS-CoV-2 by FDA under an Emergency Use Authorization (EUA). This EUA will remain in effect (meaning this test can be used) for the duration of the COVID-19 declaration under Section 564(b)(1) of the Act, 21 U.S.C. section 360bbb-3(b)(1), unless the authorization is terminated or revoked.  Performed at [facility], [address], [place], [place] [address]      Signed: Terrilee Croak  [facility] 09/05/2021, 12:34 PM

## 2021-09-05 NOTE — Plan of Care (Signed)
°  Problem: Education: Goal: Knowledge of disease or condition will improve 09/05/2021 1322 by Dorris Carnes, RN Outcome: Adequate for Discharge 09/05/2021 1322 by Dorris Carnes, RN Outcome: Progressing Goal: Knowledge of secondary prevention will improve (SELECT ALL) 09/05/2021 1322 by Dorris Carnes, RN Outcome: Adequate for Discharge 09/05/2021 1322 by Dorris Carnes, RN Outcome: Progressing Goal: Knowledge of patient specific risk factors will improve (INDIVIDUALIZE FOR PATIENT) 09/05/2021 1322 by Dorris Carnes, RN Outcome: Adequate for Discharge 09/05/2021 1322 by Dorris Carnes, RN Outcome: Progressing   Problem: Coping: Goal: Will verbalize positive feelings about self 09/05/2021 1322 by Dorris Carnes, RN Outcome: Adequate for Discharge 09/05/2021 1322 by Dorris Carnes, RN Outcome: Progressing   Problem: Health Behavior/Discharge Planning: Goal: Ability to manage health-related needs will improve 09/05/2021 1322 by Dorris Carnes, RN Outcome: Adequate for Discharge 09/05/2021 1322 by Dorris Carnes, RN Outcome: Progressing   Problem: Ischemic Stroke/TIA Tissue Perfusion: Goal: Complications of ischemic stroke/TIA will be minimized 09/05/2021 1322 by Dorris Carnes, RN Outcome: Adequate for Discharge 09/05/2021 1322 by Dorris Carnes, RN Outcome: Progressing

## 2021-09-05 NOTE — TOC Transition Note (Signed)
Transition of Care [facility]) - CM/SW Discharge Note   Patient Details  Name: Lauren Delgado MRN: [id number] Date of Birth: 08/31/1986  [facility]) CM/SW Contact:  Pollie Friar, RN Phone Number: 09/05/2021, 2:14 PM   Clinical Narrative:    Patient discharging home with self care. No f/u per PT/OT and no DME needs.  PCP arranged through [facility] with information on the AVS.  Medications delivered to the room per [place] and CM provided MATCH to assist with the cost.  Pt has transportation home.    Final next level of care: Home/Self Care Barriers to Discharge: Inadequate or no insurance, Barriers Unresolved (comment)   Patient Goals and CMS Choice        Discharge Placement                       Discharge Plan and Services   Discharge Planning Services: CM Consult                                 Social Determinants of Health (SDOH) Interventions     Readmission Risk Interventions No flowsheet data found.

## 2021-09-05 NOTE — Progress Notes (Signed)
STROKE TEAM PROGRESS NOTE   INTERVAL HISTORY Patient is sitting up comfortably in bed.  She states her numbness continues to improve.  She has some intermittent mild right facial droop    No new complaints.  Vital signs stable.   Transesophageal echocardiogram shows reduced ejection fraction of 45 to 50% with no clot or PFO or cardiac source of embolism.  Telemetry monitoring does not show cardiac arrhythmia so far.  Hypercoagulable panel labs are yet pending.  Plans are to discharge patient home today Vitals:   09/05/21 0803 09/05/21 1125 09/05/21 1224 09/05/21 1234  BP: (!) 148/86 (!) 165/90 (!) 106/25 (!) 115/34  Pulse: 98 97 92 97  Resp: 18 17 (!) 25 (!) 28  Temp: 98.1 F (36.7 C) (!) 96.9 F (36.1 C) 98.2 F (36.8 C)   TempSrc: Oral Temporal Oral   SpO2: 100% 100% 100% 99%  Weight:      Height:       CBC:  Recent Labs  Lab 09/02/21 0934  WBC 9.7  NEUTROABS 6.0  HGB 14.3  HCT 42.2  MCV 88.3  PLT 474   Basic Metabolic Panel:  Recent Labs  Lab 09/02/21 0934  NA 129*  K 4.3  CL 94*  CO2 25  GLUCOSE 512*  BUN 15  CREATININE 1.08*  CALCIUM 8.9   Lipid Panel:  Recent Labs  Lab 09/03/21 0139  CHOL 313*  TRIG 199*  HDL 44  CHOLHDL 7.1  VLDL 40  LDLCALC 229*   HgbA1c:  Recent Labs  Lab 09/03/21 0139  HGBA1C 14.7*   Urine Drug Screen:  Recent Labs  Lab 09/02/21 1015  LABOPIA NONE DETECTED  COCAINSCRNUR NONE DETECTED  LABBENZ NONE DETECTED  AMPHETMU NONE DETECTED  THCU NONE DETECTED  LABBARB NONE DETECTED    Alcohol Level  Recent Labs  Lab 09/02/21 0934  ETH <10    IMAGING past 24 hours VAS [place] TRANSCRANIAL DOPPLER W BUBBLES  Result Date: 09/05/2021  Transcranial Doppler with Bubble Patient Name:  Lauren Delgado  Date of Exam:   09/04/2021 Medical Rec #: [id number]           Accession #:    [id number] Date of Birth: Sep 19, 1985            Patient Gender: F Patient Age:   38 years Exam Location:  [facility] Procedure:      VAS [place]  TRANSCRANIAL DOPPLER W BUBBLES Referring Phys: Elwin Sleight [place] --------------------------------------------------------------------------------  Indications: Stroke. Comparison Study: No prior study Performing Technologist: Maudry Mayhew MHA, RDMS, RVT, RDCS  Examination Guidelines: A complete evaluation includes B-mode imaging, spectral Doppler, color Doppler, and power Doppler as needed of all accessible portions of each vessel. Bilateral testing is considered an integral part of a complete examination. Limited examinations for reoccurring indications may be performed as noted.  Summary: No HITS at rest or during Valsalva. Negative transcranial Doppler Bubble study with no evidence of right to left intracardiac communication.  A vascular evaluation was performed. The right middle cerebral artery was studied. An IV was inserted into the patient's left forearm. Verbal informed consent was obtained.  NEGATIVE TCD Bubble study without any evidence of right to left shunt *See table(s) above for TCD measurements and observations.  Diagnosing physician: [name] MD Electronically signed by [name] MD on 09/05/2021 at 2:20:56 PM.    Final    ECHO TEE  Result Date: 09/05/2021    TRANSESOPHOGEAL ECHO REPORT   Patient Name:  Lauren Delgado Date of Exam: 09/05/2021 Medical Rec #:  [id number]          Height:       62.0 in Accession #:    [id number]         Weight:       280.0 lb Date of Birth:  06-26-1986           BSA:          2.206 m Patient Age:    35 years           BP:           148/86 mmHg Patient Gender: F                  HR:           87 bpm. Exam Location:  Inpatient Procedure: 2D Echo, Cardiac Doppler, Color Doppler and Saline Contrast Bubble            Study Indications:     Stroke  History:         Patient has prior history of Echocardiogram examinations, most                  recent 09/04/2021. Cardiomyopathy, Previous Myocardial                  Infarction, Stroke; Risk Factors:Diabetes,  Sleep Apnea and                  Dyslipidemia.  Sonographer:     Roseanna Rainbow RDCS Referring Phys:  [place] Diagnosing Phys: Adrian Prows MD PROCEDURE: After discussion of the risks and benefits of a TEE, an informed consent was obtained from the patient. The transesophogeal probe was passed without difficulty through the esophogus of the patient. Imaged were obtained with the patient in a left lateral decubitus position. Sedation performed by different physician. The patient was monitored while under deep sedation. Anesthestetic sedation was provided intravenously by Anesthesiology: 300mg  of Propofol, 100mg  of Lidocaine. The patient developed no complications during the procedure. IMPRESSIONS  1. Left ventricular ejection fraction, by estimation, is 40 to 45%. The left ventricle has mildly decreased function. The left ventricle demonstrates global hypokinesis. There is mild left ventricular hypertrophy. Left ventricular diastolic function could not be evaluated.  2. Right ventricular systolic function is normal. The right ventricular size is normal.  3. No left atrial/left atrial appendage thrombus was detected.  4. The mitral valve is normal in structure. No evidence of mitral valve regurgitation. No evidence of mitral stenosis.  5. The aortic valve is normal in structure. Aortic valve regurgitation is not visualized. No aortic stenosis is present.  6. There is mild (Grade II) plaque involving the aortic arch and descending aorta.  7. Agitated saline contrast bubble study was negative, with no evidence of any interatrial shunt. Conclusion(s)/Recommendation(s): No LA/LAA thrombus identified. Negative bubble study for interatrial shunt. No intracardiac source of embolism detected on this on this transesophageal echocardiogram. FINDINGS  Left Ventricle: Left ventricular ejection fraction, by estimation, is 40 to 45%. The left ventricle has mildly decreased function. The left ventricle demonstrates global  hypokinesis. The left ventricular internal cavity size was normal in size. There is  mild left ventricular hypertrophy. Left ventricular diastolic function could not be evaluated. Right Ventricle: The right ventricular size is normal. No increase in right ventricular wall thickness. Right ventricular systolic function is normal. Left Atrium: Left atrial size was normal in size. No left  atrial/left atrial appendage thrombus was detected. Right Atrium: Right atrial size was normal in size. Pericardium: There is no evidence of pericardial effusion. Mitral Valve: The mitral valve is normal in structure. No evidence of mitral valve regurgitation. No evidence of mitral valve stenosis. Tricuspid Valve: The tricuspid valve is normal in structure. Tricuspid valve regurgitation is not demonstrated. No evidence of tricuspid stenosis. Aortic Valve: The aortic valve is normal in structure. Aortic valve regurgitation is not visualized. No aortic stenosis is present. Pulmonic Valve: The pulmonic valve was normal in structure. Pulmonic valve regurgitation is not visualized. No evidence of pulmonic stenosis. Aorta: The aortic root, ascending aorta and aortic arch are all structurally normal, with no evidence of dilitation or obstruction. There is mild (Grade II) plaque involving the aortic arch and descending aorta. IAS/Shunts: No atrial level shunt detected by color flow Doppler. Agitated saline contrast was given intravenously to evaluate for intracardiac shunting. Agitated saline contrast bubble study was negative, with no evidence of any interatrial shunt. Adrian Prows MD Electronically signed by Adrian Prows MD Signature Date/Time: 09/05/2021/2:55:02 PM    Final     PHYSICAL EXAM  Physical Exam  Constitutional: Obese young African-American lady not in distress Psych: Affect appropriate to situation Eyes: No scleral injection HENT: No OP obstrucion MSK: no joint deformities.  Cardiovascular: Normal rate and regular rhythm.   Respiratory: Effort normal, non-labored breathing GI: Soft.  No distension. There is no tenderness.  Skin: WDI  Neurological Exam ;  Awake  Alert oriented x 3. Normal speech and language.eye movements full without nystagmus.fundi were not visualized. Vision acuity and fields appear normal. Hearing is normal. Palatal movements are normal. Face symmetric. Tongue midline. Normal strength, tone, reflexes and coordination. Normal sensation. Gait deferred.  ASSESSMENT/PLAN Lauren Delgado is a 36 y.o. female with history of extensive cardiac history and noncompliant to medications, presenting for evaluation of dizziness, right sided weakness worse in the leg than arm which is intermittent as well as slurred speech and word finding difficulty-the word finding difficulty has resolved but speech still remained slurred in the ED, noted to have a possible left frontal area of evolving infarct in comparison to head CT that was done yesterday when she had initially presented but did not complete evaluation.  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 ASPECTS 9 CTA head & neck did not show any emergent large vessel occlusion, right proximal ICA stenosis measuring 40% CT perfusion watershed looking pattern versus artifact bilateral hemispheres. MRI  small acute left frontal and parietal infarcts. 2D Echo :EF 45-50%, decreased LV function.  Mild global hypokinesis TEE no cardiac source of embolism, clot or PFO TCD bubble study negative for PFO LDL 229 HgbA1c 14.7 VTE prophylaxis - Lovenox 40 aspirin 81 mg daily prior to admission, now on aspirin 325 mg daily and clopidogrel 75 mg daily. X 3 weeks followed by Plavix alone Therapy recommendations:  No follow up recommended Disposition:  Pending  Hypertension Home meds:  benazepril, nifedipine- not currently taking Unstable Permissive  hypertension (OK if < 220/120) but gradually normalize in 5-7 days Long-term BP goal normotensive  Hyperlipidemia Home meds:  none LDL 229, goal < 70 Add atorvastatin 80mg   High intensity statin not indicated Continue statin at discharge  Diabetes type II Uncontrolled Home meds:  Insulin non compliant HgbA1c 14.7, goal < 7.0 CBGs Recent Labs    09/04/21 1948 09/04/21 2311 09/05/21  5436  GLUCAP 200* 146* 158*    SSI  Other Stroke Risk Factors Obesity, Body mass index is 51.21 kg/m., BMI >/= 30 associated with increased stroke risk, recommend weight loss, diet and exercise as appropriate  Coronary artery disease Heart Failure Multivessel PCI- sees Dr. Haroldine Laws and Dr. Debara Pickett outpatient  Most recent echo - 2021- LVEF 45-50%    Other Active Problems Socioeconomic constraint TOC consult placed Hyponatremia Regulate blood glucose Daily labs  Hospital day # 3  Recommend discharge home today.  Outpatient follow-up in stroke clinic in 2 months.  Follow hypercoagulable panel labs.  Continue aspirin and Plavix for 3 weeks followed by Plavix alone.  Greater than 50% time during this 25-minute visit was spent in counseling and coordination of care and discussion with patient and care team and answering questions.  Stroke team will sign off.  Kindly call for questions  [name], MD Medical Director [place] Pager: [phone] 09/05/2021 2:57 PM       To contact Stroke Continuity provider, please refer to [email or web address] After hours, contact General Neurology

## 2021-09-05 NOTE — Plan of Care (Signed)
°  Problem: Coping: Goal: Will verbalize positive feelings about self Outcome: Progressing   Problem: Ischemic Stroke/TIA Tissue Perfusion: Goal: Complications of ischemic stroke/TIA will be minimized Outcome: Progressing

## 2021-09-05 NOTE — H&P (Signed)
°  Patient will hypertension, hyperlipidemia, diabetes mellitus, obesity admitted with acute left frontoparietal infarct, I was asked to perform TEE.  Patient is presently doing well, had right facial droop and right-sided weakness has essentially resolved.  She denies any chest pain or dyspnea.  Physical Exam Constitutional:      Comments: Morbidly obese in no acute distress.  Cardiovascular:     Rate and Rhythm: Normal rate and regular rhythm.     Pulses:          Carotid pulses are 2+ on the right side and 2+ on the left side.      Dorsalis pedis pulses are 2+ on the right side and 2+ on the left side.       Posterior tibial pulses are 2+ on the right side and 2+ on the left side.     Heart sounds: Normal heart sounds. No murmur heard.   No gallop.     Comments: Femoral and popliteal pulse difficult to feel due to patient's body habitus.  No leg edema. JVD difficult to see due to short neck. Pulmonary:     Effort: Pulmonary effort is normal.     Breath sounds: Normal breath sounds.  Abdominal:     General: Bowel sounds are normal.     Palpations: Abdomen is soft.     Comments: Obese. Pannus present   Acute CVA, patient referred for TEE.  I discussed the procedure with the patient.  Explained to her regarding risk of esophageal perforation, bleeding or minor trauma, aspiration pneumonia.  Patient is willing to proceed.  All questions answered.   Adrian Prows, MD, [facility] 09/05/2021, 11:58 AM Office: [phone] Fax: [phone] Pager: [phone]

## 2021-09-05 NOTE — Interval H&P Note (Signed)
History and Physical Interval Note:  09/05/2021 11:58 AM  Lauren Delgado  has presented today for surgery, with the diagnosis of stroke.  The various methods of treatment have been discussed with the patient and family. After consideration of risks, benefits and other options for treatment, the patient has consented to  Procedure(s): TRANSESOPHAGEAL ECHOCARDIOGRAM (TEE) (N/A) as a surgical intervention.  The patient's history has been reviewed, patient examined, no change in status, stable for surgery.  I have reviewed the patient's chart and labs.  Questions were answered to the patient's satisfaction.     Adrian Prows

## 2021-09-05 NOTE — Anesthesia Preprocedure Evaluation (Addendum)
Anesthesia Evaluation  Patient identified by MRN, date of birth, ID band Patient awake    Reviewed: Allergy & Precautions, Patient's Chart, lab work & pertinent test results  Airway Mallampati: II  TM Distance: >3 FB Neck ROM: Full    Dental  (+) Teeth Intact, Dental Advisory Given   Pulmonary sleep apnea ,    breath sounds clear to auscultation       Cardiovascular hypertension, + CAD, + Cardiac Stents and +CHF   Rhythm:Regular Rate:Normal     Neuro/Psych CVA negative psych ROS   GI/Hepatic negative GI ROS, Neg liver ROS,   Endo/Other  diabetesHypothyroidism   Renal/GU negative Renal ROS     Musculoskeletal negative musculoskeletal ROS (+)   Abdominal Normal abdominal exam  (+)   Peds  Hematology   Anesthesia Other Findings   Reproductive/Obstetrics                            Anesthesia Physical Anesthesia Plan  ASA: 3  Anesthesia Plan: MAC   Post-op Pain Management:    Induction: Intravenous  PONV Risk Score and Plan: 0 and Propofol infusion  Airway Management Planned: Natural Airway and Simple Face Mask  Additional Equipment: None  Intra-op Plan:   Post-operative Plan:   Informed Consent: I have reviewed the patients History and Physical, chart, labs and discussed the procedure including the risks, benefits and alternatives for the proposed anesthesia with the patient or authorized representative who has indicated his/her understanding and acceptance.       Plan Discussed with: CRNA  Anesthesia Plan Comments:         Anesthesia Quick Evaluation

## 2021-09-05 NOTE — Anesthesia Postprocedure Evaluation (Signed)
Anesthesia Post Note  Patient: Lauren Delgado  Procedure(s) Performed: TRANSESOPHAGEAL ECHOCARDIOGRAM (TEE) BUBBLE STUDY     Patient location during evaluation: PACU Anesthesia Type: MAC Level of consciousness: awake and alert Pain management: pain level controlled Vital Signs Assessment: post-procedure vital signs reviewed and stable Respiratory status: spontaneous breathing, nonlabored ventilation, respiratory function stable and patient connected to nasal cannula oxygen Cardiovascular status: stable and blood pressure returned to baseline Postop Assessment: no apparent nausea or vomiting Anesthetic complications: no   No notable events documented.  Last Vitals:  Vitals:   09/05/21 1224 09/05/21 1234  BP: (!) 106/25 (!) 115/34  Pulse: 92 97  Resp: (!) 25 (!) 28  Temp: 36.8 C   SpO2: 100% 99%    Last Pain:  Vitals:   09/05/21 1224  TempSrc: Oral  PainSc:                  [name]

## 2021-09-05 NOTE — CV Procedure (Addendum)
TEE: Under deep sedation using propafol, TEE was performed without complications: LV: Mild to moderate LVH, global hypokinesis.  EF 40 to 45%.. RV: Normal  LA: Normal. Left atrial appendage: Normal without thrombus. Normal function. Inter atrial septum is intact without defect. Double contrast study negative for atrial level shunting. No late appearance of bubbles either.  RA: Normal  MV: Normal Trace MR. TV: Normal . No TR AV: Normal. No AI or AS. PV: Normal. No PI  Thoracic and ascending aorta: Minimal soft plaque noted in the arch and descending thoracic aorta.  No definite cardiac source of cerebral embolism noted   Adrian Prows, MD, [facility] 09/05/2021, 12:20 PM Office: [phone] Fax: [phone] Pager: [phone]

## 2021-09-05 NOTE — Transfer of Care (Signed)
Immediate Anesthesia Transfer of Care Note  Patient: Lauren Delgado  Procedure(s) Performed: TRANSESOPHAGEAL ECHOCARDIOGRAM (TEE) BUBBLE STUDY  Patient Location: PACU  Anesthesia Type:MAC  Level of Consciousness: drowsy and patient cooperative  Airway & Oxygen Therapy: Patient Spontanous Breathing and Patient connected to face mask oxygen  Post-op Assessment: Report given to RN and Post -op Vital signs reviewed and stable  Post vital signs: Reviewed and stable  Last Vitals:  Vitals Value Taken Time  BP 106/25 09/05/21 1224  Temp    Pulse 92 09/05/21 1224  Resp 25 09/05/21 1224  SpO2 100 % 09/05/21 1224  Vitals shown include unvalidated device data.  Last Pain:  Vitals:   09/05/21 1125  TempSrc: Temporal  PainSc: 0-No pain         Complications: No notable events documented.

## 2021-09-06 LAB — LUPUS ANTICOAGULANT PANEL
DRVVT: 37 s (ref 0.0–47.0)
PTT Lupus Anticoagulant: 32.2 s (ref 0.0–51.9)

## 2021-09-06 LAB — PROTEIN S, TOTAL: Protein S Ag, Total: 148 % (ref 60–150)

## 2021-09-06 LAB — CARDIOLIPIN ANTIBODIES, IGG, IGM, IGA
Anticardiolipin IgA: <9 APL U/mL (ref 0–11)
Anticardiolipin IgG: <9 GPL U/mL (ref 0–14)
Anticardiolipin IgM: 14 MPL U/mL — ABNORMAL HIGH (ref 0–12)

## 2021-09-06 LAB — PROTEIN S ACTIVITY: Protein S Activity: 53 % — ABNORMAL LOW (ref 63–140)

## 2021-09-06 LAB — PROTEIN C ACTIVITY: Protein C Activity: 197 % — ABNORMAL HIGH (ref 73–180)

## 2021-09-06 LAB — PROTEIN C, TOTAL: Protein C, Total: 146 % (ref 60–150)

## 2021-09-08 ENCOUNTER — Encounter (HOSPITAL_COMMUNITY): Payer: Self-pay | Admitting: Cardiology

## 2021-09-08 LAB — FACTOR 5 LEIDEN

## 2021-09-09 ENCOUNTER — Other Ambulatory Visit: Payer: Self-pay

## 2021-09-09 ENCOUNTER — Emergency Department (HOSPITAL_BASED_OUTPATIENT_CLINIC_OR_DEPARTMENT_OTHER): Payer: Medicaid Other

## 2021-09-09 ENCOUNTER — Emergency Department (HOSPITAL_BASED_OUTPATIENT_CLINIC_OR_DEPARTMENT_OTHER)
Admission: EM | Admit: 2021-09-09 | Discharge: 2021-09-09 | Disposition: A | Discharge status: Home / Self Care | Payer: Medicaid Other | Attending: Emergency Medicine | Admitting: Emergency Medicine

## 2021-09-09 ENCOUNTER — Emergency Department (HOSPITAL_COMMUNITY): Payer: Medicaid Other

## 2021-09-09 ENCOUNTER — Encounter (HOSPITAL_BASED_OUTPATIENT_CLINIC_OR_DEPARTMENT_OTHER): Payer: Self-pay | Admitting: *Deleted

## 2021-09-09 DIAGNOSIS — Z794 Long term (current) use of insulin: Secondary | ICD-10-CM | POA: Diagnosis not present

## 2021-09-09 DIAGNOSIS — Z79899 Other long term (current) drug therapy: Secondary | ICD-10-CM | POA: Insufficient documentation

## 2021-09-09 DIAGNOSIS — J90 Pleural effusion, not elsewhere classified: Secondary | ICD-10-CM | POA: Diagnosis not present

## 2021-09-09 DIAGNOSIS — R531 Weakness: Secondary | ICD-10-CM | POA: Insufficient documentation

## 2021-09-09 DIAGNOSIS — I10 Essential (primary) hypertension: Secondary | ICD-10-CM | POA: Insufficient documentation

## 2021-09-09 DIAGNOSIS — I6521 Occlusion and stenosis of right carotid artery: Secondary | ICD-10-CM | POA: Diagnosis not present

## 2021-09-09 DIAGNOSIS — R200 Anesthesia of skin: Secondary | ICD-10-CM | POA: Diagnosis not present

## 2021-09-09 DIAGNOSIS — I639 Cerebral infarction, unspecified: Secondary | ICD-10-CM | POA: Diagnosis not present

## 2021-09-09 DIAGNOSIS — R2990 Unspecified symptoms and signs involving the nervous system: Secondary | ICD-10-CM

## 2021-09-09 DIAGNOSIS — Z7982 Long term (current) use of aspirin: Secondary | ICD-10-CM | POA: Diagnosis not present

## 2021-09-09 DIAGNOSIS — Z20822 Contact with and (suspected) exposure to covid-19: Secondary | ICD-10-CM | POA: Diagnosis not present

## 2021-09-09 DIAGNOSIS — Z955 Presence of coronary angioplasty implant and graft: Secondary | ICD-10-CM | POA: Diagnosis not present

## 2021-09-09 DIAGNOSIS — R202 Paresthesia of skin: Secondary | ICD-10-CM | POA: Insufficient documentation

## 2021-09-09 LAB — COMPREHENSIVE METABOLIC PANEL
ALT: 12 U/L (ref 0–44)
AST: 20 U/L (ref 15–41)
Albumin: 3 g/dL — ABNORMAL LOW (ref 3.5–5.0)
Alkaline Phosphatase: 83 U/L (ref 38–126)
Anion gap: 9 (ref 5–15)
BUN: 14 mg/dL (ref 6–20)
CO2: 24 mmol/L (ref 22–32)
Calcium: 8.5 mg/dL — ABNORMAL LOW (ref 8.9–10.3)
Chloride: 105 mmol/L (ref 98–111)
Creatinine, Ser: 0.87 mg/dL (ref 0.44–1.00)
GFR, Estimated: >60 mL/min (ref >60)
Glucose, Bld: 189 mg/dL — ABNORMAL HIGH (ref 70–99)
Potassium: 3.9 mmol/L (ref 3.5–5.1)
Sodium: 138 mmol/L (ref 135–145)
Total Bilirubin: 0.3 mg/dL (ref 0.3–1.2)
Total Protein: 6.7 g/dL (ref 6.5–8.1)

## 2021-09-09 LAB — URINALYSIS, ROUTINE W REFLEX MICROSCOPIC
Bilirubin Urine: NEGATIVE
Glucose, UA: 250 mg/dL — AB
Ketones, ur: NEGATIVE mg/dL
Nitrite: NEGATIVE
Protein, ur: >300 mg/dL — AB
Specific Gravity, Urine: 1.019 (ref 1.005–1.030)
WBC, UA: >50 WBC/hpf — ABNORMAL HIGH (ref 0–5)
pH: 6 (ref 5.0–8.0)

## 2021-09-09 LAB — DIFFERENTIAL
Abs Immature Granulocytes: 0.01 10*3/uL (ref 0.00–0.07)
Basophils Absolute: 0 10*3/uL (ref 0.0–0.1)
Basophils Relative: 0 %
Eosinophils Absolute: 0.1 10*3/uL (ref 0.0–0.5)
Eosinophils Relative: 1 %
Immature Granulocytes: 0 %
Lymphocytes Relative: 26 %
Lymphs Abs: 2.1 10*3/uL (ref 0.7–4.0)
Monocytes Absolute: 0.4 10*3/uL (ref 0.1–1.0)
Monocytes Relative: 5 %
Neutro Abs: 5.4 10*3/uL (ref 1.7–7.7)
Neutrophils Relative %: 68 %

## 2021-09-09 LAB — CBC
HCT: 36.9 % (ref 36.0–46.0)
Hemoglobin: 12.2 g/dL (ref 12.0–15.0)
MCH: 30 pg (ref 26.0–34.0)
MCHC: 33.1 g/dL (ref 30.0–36.0)
MCV: 90.7 fL (ref 80.0–100.0)
Platelets: 341 10*3/uL (ref 150–400)
RBC: 4.07 MIL/uL (ref 3.87–5.11)
RDW: 12.7 % (ref 11.5–15.5)
WBC: 8 10*3/uL (ref 4.0–10.5)
nRBC: 0 % (ref 0.0–0.2)

## 2021-09-09 LAB — RAPID URINE DRUG SCREEN, HOSP PERFORMED
Amphetamines: NOT DETECTED
Barbiturates: NOT DETECTED
Benzodiazepines: NOT DETECTED
Cocaine: NOT DETECTED
Opiates: NOT DETECTED
Tetrahydrocannabinol: NOT DETECTED

## 2021-09-09 LAB — RESP PANEL BY RT-PCR (FLU A&B, COVID) ARPGX2
Influenza A by PCR: NEGATIVE
Influenza B by PCR: NEGATIVE
SARS Coronavirus 2 by RT PCR: NEGATIVE

## 2021-09-09 LAB — APTT: aPTT: 29 seconds (ref 24–36)

## 2021-09-09 LAB — PROTIME-INR
INR: 1 (ref 0.8–1.2)
Prothrombin Time: 12.8 seconds (ref 11.4–15.2)

## 2021-09-09 LAB — PREGNANCY, URINE: Preg Test, Ur: NEGATIVE

## 2021-09-09 LAB — ETHANOL: Alcohol, Ethyl (B): <10 mg/dL (ref <10)

## 2021-09-09 MED ORDER — SODIUM CHLORIDE 0.9% FLUSH
3.0000 mL | Freq: Once | INTRAVENOUS | Status: AC
  Administered 2021-09-09: 3 mL via INTRAVENOUS
  Filled 2021-09-09: qty 3

## 2021-09-09 MED ORDER — LACTATED RINGERS IV BOLUS
1000.0000 mL | Freq: Once | INTRAVENOUS | Status: AC
Start: 2021-09-09 — End: 2021-09-09
  Administered 2021-09-09: 1000 mL via INTRAVENOUS

## 2021-09-09 MED ORDER — IOHEXOL 350 MG/ML SOLN
100.0000 mL | Freq: Once | INTRAVENOUS | Status: AC | PRN
Start: 2021-09-09 — End: 2021-09-09
  Administered 2021-09-09: 75 mL via INTRAVENOUS

## 2021-09-09 NOTE — Consult Note (Signed)
[facility] Telemedicine Consult   Requesting Provider: Shawna Orleans Consult Participants: Patient, bedside nurse Location of the provider: [facility] Location of the patient: [facility]  This consult was provided via telemedicine with 2-way video and audio communication. The patient/family was informed that care would be provided in this way and agreed to receive care in this manner.    Chief Complaint: Right sided numbness  HPI: 36 year old female with a history of recent stroke who underwent work-up with TEE, telemetry, echo with mildly reduced EF, but not markedly so he was discharged on aspirin and Plavix.  She was discharged on 1/3.  She was in a steady state until this morning.  This morning she has been "dizzy" by which she means both lightheadedness and room spinning.    LKW: 1/6 prior to bed tpa given?: No, recent stroke IR Thrombectomy? No, mild symptoms Time of teleneurologist evaluation: 11:01 AM  Exam: Vitals:   09/09/21 1041  BP: (!) 156/101  Pulse: 99  Resp: (!) 27  Temp: 98.1 F (36.7 C)  SpO2: 100%    General: In bed, NAD  1A: Level of Consciousness - 0 1B: Ask Month and Age - 0 1C: 'Blink Eyes' & 'Squeeze Hands' - 0 2: Test Horizontal Extraocular Movements - 0 3: Test Visual Fields - 0 4: Test Facial Palsy - 0 5A: Test Left Arm Motor Drift - 0 5B: Test Right Arm Motor Drift - 0 6A: Test Left Leg Motor Drift - 0 6B: Test Right Leg Motor Drift - 0 7: Test Limb Ataxia - 0 8: Test Sensation -she endorses mild difference in sensation, one 9: Test Language/Aphasia- 0 10: Test Dysarthria - 0 11: Test Extinction/Inattention - 0 NIHSS score: 1   Imaging Reviewed: CT head-redemonstrated recent strokes, no acute new normalities  Labs reviewed in epic and pertinent values follow: Glucose 189   Assessment: 36 year old female with waxing/waning of recent stroke symptoms.  She has not hypotensive to suggest  hypoperfusion.  My suspicion is that she does have some waxing and waning of her previous symptoms, but we will need to assess for new stroke.  Recommendations:  1) CT angio head neck 2) continue antiplatelets as previously prescribed 3) MRI brain 4) Further recommendations after above imaging    Roland Rack, MD [facility] [phone]  If 7pm- 7am, please page neurology on call as listed in [place].

## 2021-09-09 NOTE — Discharge Instructions (Addendum)
Imaging of your head did not reveal any acute findings, this is likely just an exacerbation of your last stroke, please continue with all medications that were prescribed to you.  Please follow-up with your neurologist in the next 1 to 2 weeks for reevaluation.  It is also noted that your chest x-ray shows some small amount of fluid in your lungs, I would like you to restart your Lasix 40 mg once daily for the next 5 days time.  Please call your cardiologist on Monday to inform them of this new finding and for if they would like you to continue with this and/or if they like to see you earlier in the office.  Come back to the emergency department if you develop chest pain, shortness of breath, severe abdominal pain, uncontrolled nausea, vomiting, diarrhea.

## 2021-09-09 NOTE — ED Triage Notes (Signed)
BIB family for R arm numbness and weakness onset present upon waking at 0715, LKN at 0200 when she went to bed. Seen and admitted for CVA 12/31.

## 2021-09-09 NOTE — ED Provider Notes (Signed)
Patient was originally seen by Dr. Meredith Mody at [place] emergency department, was sent to [facility] for rule out of a new stroke.  Please see her note for full detail.  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  Started around 8:30 AM, went to bed last night without any abnormalities.  Upon patient's arrival to [facility] Dr. Leonel Ramsay of neurology has evaluated the patient, and recommended CT angio head and neck continue platelet therapy MRI brain for further evaluation.   Current work-up CBC unremarkable CMP shows glucose of 189 calcium 8.5 albumin 3.0 INR prothrombin time unremarkable APTT unremarkable urine pregnancy negative, ethanol less than 10, UA shows large leukocytes red blood cells white blood cells many bacteria squames cells present.  Urine rapid drug screen negative respiratory panel negative, CT head shows scattered appearance of small left frontal lobe infarction seen by MRI on 12/31 no acute abnormalities present.,  CTA head neck negative for acute findings, does show pleural effusions new since December do recommend dedicated chest x-ray.  MRI brain was negative for acute findings.  Patient is reassessed she states that her only complaint at this time as she has slight paresthesias in her right lower leg, no weakness or paresthesias in her right upper arm or face, no headaches or change in vision.  It was noted that patient had an abnormal UA she has no urinary symptoms, I will defer on culturing this as it appears to be contaminated I have very low suspicion for UTI [place] or kidney stones at this time.  Of note patient had possible pleural effusion seen on her CTA of head and neck lung sounds were clear bilaterally, patient does not endorse orthopnea or worsening peripheral edema, will obtain dedicated chest x-ray for further evaluation.  Chest  x-ray shows small bilateral pleural effusions with diffuse interstitial fine opacities may reflect edema or infection.  I have very low suspicion for infection at this time as she is not endorse any URI-like symptoms lung sounds are clear bilaterally, no leukocytosis, afebrile nontachycardic nontachypneic. After  reviewing patient's chart patient was diagnosed with congestive heart failure during her last pregnancy, she recently had an echo which shows an EF of 45-50 with grade 1 diastolic failure.  She was originally on 40 of Lasix once daily, we will have her restart this for the next 4 days time, patient states she still has a bout of this which she did not use we will have her start this.  And have her follow-up with her cardiologist for further evaluation.  I instructed her to contact them on Monday for further recommendations.  She is agreement this plan.  Vital signs have remained stable, no indication for hospital admission.  Patient discussed with attending and they agreed with assessment and plan.  Patient given at home care as well strict return precautions.  Patient verbalized that they understood agreed to said plan.        Marcello Fennel, PA-C 09/09/21 1842    Pattricia Boss, MD 09/10/21 2031

## 2021-09-09 NOTE — ED Provider Notes (Signed)
Please see prior provider note for more detail.   Briefly: Patient is 36 y.o.   Here for MRI please see prior provider note.  DDX: concern for stroke  Plan: MRI      Physical Exam  BP (!) 137/91 (BP Location: Right Wrist)    Pulse 96    Temp 98.3 F (36.8 C) (Oral)    Resp 18    Ht 5\' 2"  (1.575 m)    Wt 122.9 kg    LMP 08/25/2021 (Exact Date)    SpO2 100%    BMI 49.57 kg/m   Physical Exam  Procedures  Procedures Results for orders placed or performed during the hospital encounter of 09/09/21  Resp Panel by RT-PCR (Flu A&B, Covid) Nasopharyngeal Swab   Specimen: Nasopharyngeal Swab; Nasopharyngeal(NP) swabs in vial transport medium  Result Value Ref Range   SARS Coronavirus 2 by RT PCR NEGATIVE NEGATIVE   Influenza A by PCR NEGATIVE NEGATIVE   Influenza B by PCR NEGATIVE NEGATIVE  Protime-INR  Result Value Ref Range   Prothrombin Time 12.8 11.4 - 15.2 seconds   INR 1.0 0.8 - 1.2  APTT  Result Value Ref Range   aPTT 29 24 - 36 seconds  CBC  Result Value Ref Range   WBC 8.0 4.0 - 10.5 K/uL   RBC 4.07 3.87 - 5.11 MIL/uL   Hemoglobin 12.2 12.0 - 15.0 g/dL   HCT 36.9 36.0 - 46.0 %   MCV 90.7 80.0 - 100.0 fL   MCH 30.0 26.0 - 34.0 pg   MCHC 33.1 30.0 - 36.0 g/dL   RDW 12.7 11.5 - 15.5 %   Platelets 341 150 - 400 K/uL   nRBC 0.0 0.0 - 0.2 %  Differential  Result Value Ref Range   Neutrophils Relative % 68 %   Neutro Abs 5.4 1.7 - 7.7 K/uL   Lymphocytes Relative 26 %   Lymphs Abs 2.1 0.7 - 4.0 K/uL   Monocytes Relative 5 %   Monocytes Absolute 0.4 0.1 - 1.0 K/uL   Eosinophils Relative 1 %   Eosinophils Absolute 0.1 0.0 - 0.5 K/uL   Basophils Relative 0 %   Basophils Absolute 0.0 0.0 - 0.1 K/uL   Immature Granulocytes 0 %   Abs Immature Granulocytes 0.01 0.00 - 0.07 K/uL  Comprehensive metabolic panel  Result Value Ref Range   Sodium 138 135 - 145 mmol/L   Potassium 3.9 3.5 - 5.1 mmol/L   Chloride 105 98 - 111 mmol/L   CO2 24 22 - 32 mmol/L   Glucose, Bld  189 (H) 70 - 99 mg/dL   BUN 14 6 - 20 mg/dL   Creatinine, Ser 0.87 0.44 - 1.00 mg/dL   Calcium 8.5 (L) 8.9 - 10.3 mg/dL   Total Protein 6.7 6.5 - 8.1 g/dL   Albumin 3.0 (L) 3.5 - 5.0 g/dL   AST 20 15 - 41 U/L   ALT 12 0 - 44 U/L   Alkaline Phosphatase 83 38 - 126 U/L   Total Bilirubin 0.3 0.3 - 1.2 mg/dL   GFR, Estimated >60 >60 mL/min   Anion gap 9 5 - 15  Pregnancy, urine  Result Value Ref Range   Preg Test, Ur NEGATIVE NEGATIVE  Ethanol  Result Value Ref Range   Alcohol, Ethyl (B) <10 <10 mg/dL  Urine rapid drug screen (hosp performed)  Result Value Ref Range   Opiates NONE DETECTED NONE DETECTED   Cocaine NONE DETECTED NONE DETECTED   Benzodiazepines NONE  DETECTED NONE DETECTED   Amphetamines NONE DETECTED NONE DETECTED   Tetrahydrocannabinol NONE DETECTED NONE DETECTED   Barbiturates NONE DETECTED NONE DETECTED  Urinalysis, Routine w reflex microscopic  Result Value Ref Range   Color, Urine YELLOW YELLOW   APPearance HAZY (A) CLEAR   Specific Gravity, Urine 1.019 1.005 - 1.030   pH 6.0 5.0 - 8.0   Glucose, UA 250 (A) NEGATIVE mg/dL   Hgb urine dipstick MODERATE (A) NEGATIVE   Bilirubin Urine NEGATIVE NEGATIVE   Ketones, ur NEGATIVE NEGATIVE mg/dL   Protein, ur >300 (A) NEGATIVE mg/dL   Nitrite NEGATIVE NEGATIVE   Leukocytes,Ua LARGE (A) NEGATIVE   RBC / HPF 21-50 0 - 5 RBC/hpf   WBC, UA >50 (H) 0 - 5 WBC/hpf   Bacteria, UA MANY (A) NONE SEEN   Squamous Epithelial / LPF 11-20 0 - 5   Mucus PRESENT    Budding Yeast PRESENT    CT ANGIO HEAD NECK W WO CM  Result Date: 09/09/2021 CLINICAL DATA:  Code stroke. 36 year old female with dizziness and right upper extremity numbness. Scattered small left MCA territory the infarcts on December 31st. EXAM: CT ANGIOGRAPHY HEAD AND NECK TECHNIQUE: Multidetector CT imaging of the head and neck was performed using the standard protocol during bolus administration of intravenous contrast. Multiplanar CT image reconstructions and MIPs  were obtained to evaluate the vascular anatomy. Carotid stenosis measurements (when applicable) are obtained utilizing NASCET criteria, using the distal internal carotid diameter as the denominator. CONTRAST:  79mL OMNIPAQUE IOHEXOL 350 MG/ML SOLN COMPARISON:  Plain head CT 1055 hours today. CTA head and neck 09/02/2021. FINDINGS: CTA NECK Skeleton: No acute osseous abnormality identified. Upper chest: Layering pleural effusions are new since 09/02/2021, visible fluid volume is small. Confluent postinflammatory appearing tree-in-bud nodular opacities in the upper lobes are stable and there are several small postinflammatory calcified superior mediastinal lymph nodes again noted. Visible central pulmonary arteries appear patent. Other neck: Negative. Aortic arch: 3 vessel arch configuration.  No arch atherosclerosis. Right carotid system: Negative right CCA. Probable soft plaque right ICA origin (series 13, image 142) but less than 50 % stenosis with respect to the distal vessel. Mildly tortuous right ICA to the skull base. Left carotid system: Negative left CCA. Negative left carotid bifurcation. Tortuous cervical left ICA with a partially retropharyngeal course, but no plaque or stenosis to the skull base. Vertebral arteries: Negative proximal right subclavian artery and normal right vertebral artery origin. Dominant right vertebral artery appears patent and within normal limits to the skull base. Patent proximal left subclavian artery without plaque or stenosis. Non dominant left vertebral artery, origin not well seen with left subclavian venous contrast artifact today. But the left vertebral artery remains patent though diminutive to the skull base, stable caliber from last month. CTA HEAD Posterior circulation: Patent distal vertebral arteries and vertebrobasilar junction with dominant right V4 segment. Patent PICA origins. Patent basilar artery. No vertebral or basilar stenosis. Patent SCA and PCA origins.  Bilateral PCA branches are within normal limits, stable. Anterior circulation: Both ICA siphons are patent, the left appears somewhat dominant. Minimal left siphon calcified plaque suspected. No siphon stenosis. Patent carotid termini. Patent MCA and ACA origins. Dominant left ACA A1 as before. Normal anterior communicating artery. Azygous ACA anatomy. ACA branches are within normal limits. Right MCA M1 segment and trifurcation are patent without stenosis. Right MCA branches are stable and within normal limits. Left MCA M1 segment and trifurcation are patent without stenosis. Left MCA branches  are stable and within normal limits. No branch occlusion identified. Venous sinuses: Pain. Anatomic variants: Dominant right vertebral artery, left is diminutive. Dominant left ACA A1 and azygous A2 anatomy. Review of the MIP images confirms the above findings IMPRESSION: 1. Negative for large vessel occlusion. 2. Stable CTA head and neck since 09/02/2021. Isolated though age advanced atherosclerosis at the right ICA origin without hemodynamically significant stenosis. 3. Layering pleural effusions are new since December. Recommend correlation with dedicated chest imaging. Electronically Signed   By: Genevie Ann M.D.   On: 09/09/2021 12:09   CT Head Wo Contrast  Result Date: 09/02/2021 CLINICAL DATA:  Code stroke. Aphasia with right arm and leg weakness EXAM: CT HEAD WITHOUT CONTRAST TECHNIQUE: Contiguous axial images were obtained from the base of the skull through the vertex without intravenous contrast. COMPARISON:  Yesterday FINDINGS: Brain: Small low-density along the left frontal cortex laterally, interval and consistent with acute infarct. No hemorrhage, hydrocephalus, or collection. Vascular: No hyperdense vessel or unexpected calcification. Skull: Normal. Negative for fracture or focal lesion. Sinuses/Orbits: No acute finding. Other: These results were communicated to Dr. Rory Percy at 10:08 am on 09/02/2021 by text page  via the [facility] messaging system. ASPECTS [facility] Stroke Program Early CT Score) - Ganglionic level infarction (caudate, lentiform nuclei, internal capsule, insula, M1-M3 cortex): 7 - Supraganglionic infarction (M4-M6 cortex): 2 Total score (0-10 with 10 being normal): 9 IMPRESSION: 1. Small acute infarct in the left frontal cortex.  ASPECTS is 9. 2.  No acute hemorrhage. Electronically Signed   By: Jorje Guild M.D.   On: 09/02/2021 10:10   MR BRAIN WO CONTRAST  Result Date: 09/02/2021 CLINICAL DATA:  Neuro deficit, acute, stroke suspected. Right-sided weakness, speech disturbance, and facial droop. EXAM: MRI HEAD WITHOUT CONTRAST TECHNIQUE: Multiplanar, multiecho pulse sequences of the brain and surrounding structures were obtained without intravenous contrast. COMPARISON:  Head and neck CTA and CTP 09/02/2021 FINDINGS: Brain: There are small acute cortical and subcortical infarcts in the left frontal and parietal lobes (MCA distribution). There is some associated petechial hemorrhage in the left frontal lobe. The brain is normal in signal elsewhere. No mass, midline shift, or extra-axial fluid collection is evident. The ventricles are normal in size. Vascular: Major intracranial vascular flow voids are preserved. Skull and upper cervical spine: Unremarkable bone marrow signal. Sinuses/Orbits: Unremarkable orbits. Paranasal sinuses and mastoid air cells are clear. Other: None. 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 Electronically Signed   By: Logan Bores M.D.   On: 09/02/2021 19:55   VAS [place] TRANSCRANIAL DOPPLER W BUBBLES  Result Date: 09/05/2021  Transcranial Doppler with Bubble Patient Name:  NELIA ROGOFF  Date of Exam:   09/04/2021 Medical Rec #: [id number]           Accession #:    [id number] Date of Birth: [birth year]            Patient Gender: F Patient Age:   43 years Exam Location:  [facility] Procedure:      VAS [place] TRANSCRANIAL DOPPLER W BUBBLES Referring Phys:  Elwin Sleight [place] --------------------------------------------------------------------------------  Indications: Stroke. Comparison Study: No prior study Performing Technologist: Maudry Mayhew MHA, RDMS, RVT, RDCS  Examination Guidelines: A complete evaluation includes B-mode imaging, spectral Doppler, color Doppler, and power Doppler as needed of all accessible portions of each vessel. Bilateral testing is considered an integral part of a complete examination. Limited examinations for reoccurring indications may be performed as noted.  Summary: No HITS at  rest or during Valsalva. Negative transcranial Doppler Bubble study with no evidence of right to left intracardiac communication.  A vascular evaluation was performed. The right middle cerebral artery was studied. An IV was inserted into the patient's left forearm. Verbal informed consent was obtained.  NEGATIVE TCD Bubble study without any evidence of right to left shunt *See table(s) above for TCD measurements and observations.  Diagnosing physician: [name] MD Electronically signed by [name] MD on 09/05/2021 at 2:20:56 PM.    Final    ECHOCARDIOGRAM COMPLETE  Result Date: 09/04/2021    ECHOCARDIOGRAM REPORT   Patient Name:   OMEKA HOLBEN Date of Exam: 09/04/2021 Medical Rec #:  [id number]          Height:       62.0 in Accession #:    [id number]         Weight:       280.0 lb Date of Birth:  1986/02/23           BSA:          2.206 m Patient Age:    80 years           BP:           158/86 mmHg Patient Gender: F                  HR:           95 bpm. Exam Location:  Inpatient Procedure: 2D Echo, Cardiac Doppler and Color Doppler Indications:    Stroke I63.9  History:        Patient has prior history of Echocardiogram examinations, most                 recent 06/01/2020. CHF, Previous Myocardial Infarction and CAD;                 Risk Factors:Hypertension, Dyslipidemia and Diabetes.  Sonographer:    Beryle Beams RDCS Referring Phys: [place]  1. Left ventricular ejection fraction, by estimation, is 45 to 50%. The left ventricle has mildly decreased function. The left ventricle demonstrates global hypokinesis. Left ventricular diastolic parameters are consistent with Grade I diastolic dysfunction (impaired relaxation).  2. Right ventricular systolic function is normal. The right ventricular size is normal. Tricuspid regurgitation signal is inadequate for assessing PA pressure.  3. The mitral valve is normal in structure. Trivial mitral valve regurgitation. No evidence of mitral stenosis.  4. The aortic valve has an indeterminant number of cusps. Aortic valve regurgitation is not visualized. No aortic stenosis is present.  5. IVC is small, suggesting low RA pressure and hypovolemia. FINDINGS  Left Ventricle: Left ventricular ejection fraction, by estimation, is 45 to 50%. The left ventricle has mildly decreased function. The left ventricle demonstrates global hypokinesis. The left ventricular internal cavity size was normal in size. There is  no left ventricular hypertrophy. Left ventricular diastolic parameters are consistent with Grade I diastolic dysfunction (impaired relaxation). Normal left ventricular filling pressure. Right Ventricle: The right ventricular size is normal. No increase in right ventricular wall thickness. Right ventricular systolic function is normal. Tricuspid regurgitation signal is inadequate for assessing PA pressure. Left Atrium: Left atrial size was normal in size. Right Atrium: Right atrial size was normal in size. Pericardium: There is no evidence of pericardial effusion. Mitral Valve: The mitral valve is normal in structure. Trivial mitral valve regurgitation. No evidence of mitral valve stenosis. Tricuspid Valve: The tricuspid valve  is normal in structure. Tricuspid valve regurgitation is not demonstrated. No evidence of tricuspid stenosis. Aortic Valve: The aortic valve has an indeterminant  number of cusps. Aortic valve regurgitation is not visualized. No aortic stenosis is present. Aortic valve mean gradient measures 3.5 mmHg. Aortic valve peak gradient measures 5.9 mmHg. Aortic valve area, by VTI measures 1.98 cm. Pulmonic Valve: The pulmonic valve was not well visualized. Pulmonic valve regurgitation is not visualized. No evidence of pulmonic stenosis. Aorta: The aortic root is normal in size and structure. Venous: IVC is small, suggesting low RA pressure and hypovolemia. IAS/Shunts: No atrial level shunt detected by color flow Doppler.  LEFT VENTRICLE PLAX 2D LVIDd:         5.10 cm      Diastology LVIDs:         4.10 cm      LV e' medial:    7.34 cm/s LV PW:         1.00 cm      LV E/e' medial:  12.8 LV IVS:        1.00 cm      LV e' lateral:   11.60 cm/s LVOT diam:     1.90 cm      LV E/e' lateral: 8.1 LV SV:         43 LV SV Index:   20 LVOT Area:     2.84 cm  LV Volumes (MOD) LV vol d, MOD A2C: 146.0 ml LV vol d, MOD A4C: 115.0 ml LV vol s, MOD A2C: 66.5 ml LV vol s, MOD A4C: 79.6 ml LV SV MOD A2C:     79.5 ml LV SV MOD A4C:     115.0 ml LV SV MOD BP:      62.3 ml RIGHT VENTRICLE RV S prime:     13.10 cm/s TAPSE (M-mode): 1.6 cm LEFT ATRIUM           Index        RIGHT ATRIUM          Index LA diam:      3.40 cm 1.54 cm/m   RA Area:     6.58 cm LA Vol (A2C): 46.2 ml 20.95 ml/m  RA Volume:   9.23 ml  4.18 ml/m LA Vol (A4C): 50.0 ml 22.67 ml/m  AORTIC VALVE                    PULMONIC VALVE AV Area (Vmax):    2.32 cm     PV Vmax:       0.76 m/s AV Area (Vmean):   2.12 cm     PV Vmean:      48.200 cm/s AV Area (VTI):     1.98 cm     PV VTI:        0.145 m AV Vmax:           121.19 cm/s  PV Peak grad:  2.3 mmHg AV Vmean:          88.605 cm/s  PV Mean grad:  1.0 mmHg AV VTI:            0.217 m AV Peak Grad:      5.9 mmHg AV Mean Grad:      3.5 mmHg LVOT Vmax:         99.00 cm/s LVOT Vmean:        66.200 cm/s LVOT VTI:          0.152 m  LVOT/AV VTI ratio: 0.70  AORTA Ao Root diam: 2.40 cm Ao  Asc diam:  2.80 cm MITRAL VALVE MV Area (PHT): 4.57 cm     SHUNTS MV Decel Time: 166 msec     Systemic VTI:  0.15 m MV E velocity: 94.10 cm/s   Systemic Diam: 1.90 cm MV A velocity: 104.00 cm/s MV E/A ratio:  0.90 Carlyle Dolly MD Electronically signed by Carlyle Dolly MD Signature Date/Time: 09/04/2021/11:39:15 AM    Final    ECHO TEE  Result Date: 09/05/2021    TRANSESOPHOGEAL ECHO REPORT   Patient Name:   [name] Date of Exam: 09/05/2021 Medical Rec #:  [id number]          Height:       62.0 in Accession #:    [id number]         Weight:       280.0 lb Date of Birth:  03-08-1986           BSA:          2.206 m Patient Age:    35 years           BP:           148/86 mmHg Patient Gender: F                  HR:           87 bpm. Exam Location:  Inpatient Procedure: 2D Echo, Cardiac Doppler, Color Doppler and Saline Contrast Bubble            Study Indications:     Stroke  History:         Patient has prior history of Echocardiogram examinations, most                  recent 09/04/2021. Cardiomyopathy, Previous Myocardial                  Infarction, Stroke; Risk Factors:Diabetes, Sleep Apnea and                  Dyslipidemia.  Sonographer:     Roseanna Rainbow RDCS Referring Phys:  [place] Diagnosing Phys: Adrian Prows MD PROCEDURE: After discussion of the risks and benefits of a TEE, an informed consent was obtained from the patient. The transesophogeal probe was passed without difficulty through the esophogus of the patient. Imaged were obtained with the patient in a left lateral decubitus position. Sedation performed by different physician. The patient was monitored while under deep sedation. Anesthestetic sedation was provided intravenously by Anesthesiology: 300mg  of Propofol, 100mg  of Lidocaine. The patient developed no complications during the procedure. IMPRESSIONS  1. Left ventricular ejection fraction, by estimation, is 40 to 45%. The left ventricle has mildly decreased function. The left ventricle  demonstrates global hypokinesis. There is mild left ventricular hypertrophy. Left ventricular diastolic function could not be evaluated.  2. Right ventricular systolic function is normal. The right ventricular size is normal.  3. No left atrial/left atrial appendage thrombus was detected.  4. The mitral valve is normal in structure. No evidence of mitral valve regurgitation. No evidence of mitral stenosis.  5. The aortic valve is normal in structure. Aortic valve regurgitation is not visualized. No aortic stenosis is present.  6. There is mild (Grade II) plaque involving the aortic arch and descending aorta.  7. Agitated saline contrast bubble study was negative, with no evidence of any interatrial shunt. Conclusion(s)/Recommendation(s): No LA/LAA thrombus identified.  Negative bubble study for interatrial shunt. No intracardiac source of embolism detected on this on this transesophageal echocardiogram. FINDINGS  Left Ventricle: Left ventricular ejection fraction, by estimation, is 40 to 45%. The left ventricle has mildly decreased function. The left ventricle demonstrates global hypokinesis. The left ventricular internal cavity size was normal in size. There is  mild left ventricular hypertrophy. Left ventricular diastolic function could not be evaluated. Right Ventricle: The right ventricular size is normal. No increase in right ventricular wall thickness. Right ventricular systolic function is normal. Left Atrium: Left atrial size was normal in size. No left atrial/left atrial appendage thrombus was detected. Right Atrium: Right atrial size was normal in size. Pericardium: There is no evidence of pericardial effusion. Mitral Valve: The mitral valve is normal in structure. No evidence of mitral valve regurgitation. No evidence of mitral valve stenosis. Tricuspid Valve: The tricuspid valve is normal in structure. Tricuspid valve regurgitation is not demonstrated. No evidence of tricuspid stenosis. Aortic Valve: The  aortic valve is normal in structure. Aortic valve regurgitation is not visualized. No aortic stenosis is present. Pulmonic Valve: The pulmonic valve was normal in structure. Pulmonic valve regurgitation is not visualized. No evidence of pulmonic stenosis. Aorta: The aortic root, ascending aorta and aortic arch are all structurally normal, with no evidence of dilitation or obstruction. There is mild (Grade II) plaque involving the aortic arch and descending aorta. IAS/Shunts: No atrial level shunt detected by color flow Doppler. Agitated saline contrast was given intravenously to evaluate for intracardiac shunting. Agitated saline contrast bubble study was negative, with no evidence of any interatrial shunt. Adrian Prows MD Electronically signed by Adrian Prows MD Signature Date/Time: 09/05/2021/2:55:02 PM    Final    CT HEAD CODE STROKE WO CONTRAST  Addendum Date: 09/09/2021   ADDENDUM REPORT: 09/09/2021 11:27 ADDENDUM: Study discussed by telephone with Dr. Campbell Stall on 7/0/6237 at 1105 hours. Electronically Signed   By: Genevie Ann M.D.   On: 09/09/2021 11:27   Result Date: 09/09/2021 CLINICAL DATA:  Code stroke. 36 year old female with dizziness and right upper extremity numbness. Scattered small left MCA territory infarcts on December 31st. EXAM: CT HEAD WITHOUT CONTRAST TECHNIQUE: Contiguous axial images were obtained from the base of the skull through the vertex without intravenous contrast. COMPARISON:  Brain MRI 09/02/2021 and earlier. FINDINGS: Brain: Normal cerebral volume. Scattered small areas of left frontal lobe hypodensity (series 3, image 17) corresponding to abnormal diffusion on the recent MRI. This includes some of the left perirolandic cortex. No superimposed midline shift, ventriculomegaly, mass effect, evidence of mass lesion, intracranial hemorrhage or evidence of new cortically based acute infarction. Vascular: No suspicious intracranial vascular hyperdensity. Skull: Negative. Sinuses/Orbits:  Visualized paranasal sinuses and mastoids are stable and well aerated. Other: Visualized orbits and scalp soft tissues are within normal limits. ASPECTS [facility] Stroke Program Early CT Score) Total score (0-10 with 10 being normal): 10 (left MCA hypodensity from known 09/02/2021 infarct). IMPRESSION: 1. Expected CT appearance of scattered small left frontal lobe infarcts seen by MRI 09/02/2021. No associated hemorrhage or mass effect. 2. No new intracranial abnormality.  ASPECTS 10. Electronically Signed: By: Genevie Ann M.D. On: 09/09/2021 11:01   CT ANGIO HEAD NECK W WO CM W PERF (CODE STROKE)  Result Date: 09/02/2021 CLINICAL DATA:  Stroke-like symptoms. EXAM: CT ANGIOGRAPHY HEAD AND NECK CT PERFUSION BRAIN TECHNIQUE: Multidetector CT imaging of the head and neck was performed using the standard protocol during bolus administration of intravenous contrast. Multiplanar CT image reconstructions  and MIPs were obtained to evaluate the vascular anatomy. Carotid stenosis measurements (when applicable) are obtained utilizing NASCET criteria, using the distal internal carotid diameter as the denominator. Multiphase CT imaging of the brain was performed following IV bolus contrast injection. Subsequent parametric perfusion maps were calculated using RAPID software. CONTRAST:  162mL OMNIPAQUE IOHEXOL 350 MG/ML SOLN COMPARISON:  Noncontrast head CT from today FINDINGS: CTA NECK FINDINGS Aortic arch: Minimal coverage is negative Right carotid system: Low-density atheromatous type wall thickening at the report right bifurcation with proximal ICA stenosis measuring 40%. No ulceration or beading. Left carotid system: Vessels are smooth and widely patent. Vertebral arteries: Proximal subclavian arteries are smooth and widely patent. Dominant right vertebral artery. Both vertebral arteries are smoothly contoured and widely patent. Skeleton: No acute finding Other neck: Subcentimeter bilateral thyroid nodules. No followup  recommended (ref: J Am Coll Radiol. 2015 Feb;12(2): 143-50). Upper chest: biapical reticulonodular opacity with calcification, chronic by prior dedicated chest CT. Review of the MIP images confirms the above findings CTA HEAD FINDINGS Anterior circulation: Mild atheromatous plaque on the carotid siphons. No major branch occlusion, beading, or aneurysm. Azygous A2. Posterior circulation: Vessels are smooth and widely patent. No branch occlusion, beading, or aneurysm. Venous sinuses: Unremarkable in the arterial phase. Anatomic variants: As above Review of the MIP images confirms the above findings CT Brain Perfusion Findings: ASPECTS: 9 CBF (<30%) Volume: 25mL Perfusion (Tmax>6.0s) volume: 51mL-but non anatomic in distribution given the history and CTA findings IMPRESSION: 1. No emergent large vessel occlusion. 2. Noncontributory CT perfusion. 3. 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 Electronically Signed   By: Jorje Guild M.D.   On: 09/02/2021 10:20    ED Course / MDM     Patient here for MRI to evaluate for stroke. No complaints at this time.     Pati Gallo [place], [place] 09/09/21 1532    Wyvonnia Dusky, MD 09/10/21 [phone]

## 2021-09-09 NOTE — ED Provider Notes (Signed)
[facility] EMERGENCY DEPT Provider Note   CSN: [id number] Arrival date & time: 09/09/21  1006     History  Chief Complaint  Patient presents with   Weakness    R arm numbness and weakness onset present upon waking, LKN at 0200 when she went to bed    Lauren Delgado is a 36 y.o. female.  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 Pt states she woke up normal but began have right UE and right LE numbness/tingling at 0830 this morning. Denies speech changes or facial asymmetry.  Denies motor deficits. Denies vision changes.  The history is provided by the patient. No language interpreter was used.  Weakness Associated symptoms: no abdominal pain, no arthralgias, no chest pain, no cough, no dysuria, no fever, no seizures, no shortness of breath and no vomiting       Home Medications Prior to Admission medications   Medication Sig Start Date End Date Taking? Authorizing Provider  amLODipine (NORVASC) 5 MG tablet Take 1 tablet (5 mg total) by mouth daily. 09/05/21 12/04/21  Terrilee Croak, MD  aspirin 325 MG tablet Take 1 tablet (325 mg total) by mouth daily for 21 days. 09/05/21 09/26/21  Terrilee Croak, MD  atorvastatin (LIPITOR) 80 MG tablet Take 1 tablet (80 mg total) by mouth daily. 09/05/21 12/04/21  Terrilee Croak, MD  clopidogrel (PLAVIX) 75 MG tablet Take 1 tablet (75 mg total) by mouth daily. 09/05/21 12/04/21  Terrilee Croak, MD  glipiZIDE (GLUCOTROL XL) 5 MG 24 hr tablet Take 1 tablet (5 mg total) by mouth daily with breakfast. 09/05/21 12/04/21  Dahal, Marlowe Aschoff, MD  insulin isophane & regular human KwikPen (HUMULIN 70/30 MIX) (70-30) 100 UNIT/ML KwikPen Inject 10 units every morning and evening. 09/05/21   Dahal, Marlowe Aschoff, MD  Insulin Pen Needle 32G X 4 MM MISC Use to inject insulin up to 4 times daily as needed. 09/05/21   Terrilee Croak, MD   lisinopril (ZESTRIL) 10 MG tablet Take 1 tablet (10 mg total) by mouth daily. 09/05/21 12/04/21  Terrilee Croak, MD  metFORMIN (GLUCOPHAGE) 1000 MG tablet Take 1 tablet (1,000 mg total) by mouth 2 (two) times daily with a meal. 09/09/21 12/08/21  Dahal, Marlowe Aschoff, MD      Allergies    Patient has no known allergies.    Review of Systems   Review of Systems  Constitutional:  Negative for chills and fever.  HENT:  Negative for ear pain and sore throat.   Eyes:  Negative for pain and visual disturbance.  Respiratory:  Negative for cough and shortness of breath.   Cardiovascular:  Negative for chest pain and palpitations.  Gastrointestinal:  Negative for abdominal pain and vomiting.  Genitourinary:  Negative for dysuria and hematuria.  Musculoskeletal:  Negative for arthralgias and back pain.  Skin:  Negative for color change and rash.  Neurological:  Positive for numbness. Negative for seizures, syncope and weakness.  All other systems reviewed and are negative.  Physical Exam Updated Vital Signs BP (!) 156/101 (BP Location: Left Arm)    Pulse 99    Temp 98.1 F (36.7 C) (Oral)    Resp (!) 27    Ht 5\' 2"  (1.575 m)    Wt 122.9 kg    LMP 08/25/2021 (Exact Date)    SpO2 100%    BMI 49.57 kg/m  Physical Exam Vitals  and nursing note reviewed.  Constitutional:      General: She is not in acute distress.    Appearance: She is well-developed. She is morbidly obese.  HENT:     Head: Normocephalic and atraumatic.  Eyes:     General: Lids are normal. Vision grossly intact.     Conjunctiva/sclera: Conjunctivae normal.     Pupils: Pupils are equal, round, and reactive to light.     Visual Fields: Right eye visual fields normal and left eye visual fields normal.  Cardiovascular:     Rate and Rhythm: Normal rate and regular rhythm.     Heart sounds: No murmur heard. Pulmonary:     Effort: Pulmonary effort is normal. No respiratory distress.     Breath sounds: Normal breath sounds.  Abdominal:      Palpations: Abdomen is soft.     Tenderness: There is no abdominal tenderness.  Musculoskeletal:        General: No swelling.     Cervical back: Neck supple.  Skin:    General: Skin is warm and dry.     Capillary Refill: Capillary refill takes less than 2 seconds.  Neurological:     Mental Status: She is alert and oriented to person, place, and time.     GCS: GCS eye subscore is 4. GCS verbal subscore is 5. GCS motor subscore is 6.     Cranial Nerves: Cranial nerves 2-12 are intact.     Sensory: Sensory deficit present.     Motor: Motor function is intact.     Coordination: Coordination is intact.  Psychiatric:        Mood and Affect: Mood normal.    ED Results / Procedures / Treatments   Labs (all labs ordered are listed, but only abnormal results are displayed) Labs Reviewed  RESP PANEL BY RT-PCR (FLU A&B, COVID) ARPGX2  PROTIME-INR  APTT  CBC  DIFFERENTIAL  COMPREHENSIVE METABOLIC PANEL  PREGNANCY, URINE  BASIC METABOLIC PANEL  ETHANOL  RAPID URINE DRUG SCREEN, HOSP PERFORMED  URINALYSIS, ROUTINE W REFLEX MICROSCOPIC  CBG MONITORING, ED    EKG None  Radiology No results found.  Procedures .Critical Care Performed by: Lianne Cure, DO Authorized by: Lianne Cure, DO   Critical care provider statement:    Critical care time (minutes):  32   Critical care was necessary to treat or prevent imminent or life-threatening deterioration of the following conditions: code stroke, hx strokes, new neuro deficits.   Critical care was time spent personally by me on the following activities:  Development of treatment plan with patient or surrogate, discussions with consultants, evaluation of patient's response to treatment, examination of patient, ordering and review of laboratory studies, ordering and review of radiographic studies, ordering and performing treatments and interventions, pulse oximetry, re-evaluation of patient's condition and review of old charts Comments:      Neurology at [place]     Medications Ordered in ED Medications  sodium chloride flush (NS) 0.9 % injection 3 mL (has no administration in time range)    ED Course/ Medical Decision Making/ A&P                           Medical Decision Making  Patient originally triaged to room as a NO Code Stroke with the understanding that symptoms started upon waking and outside of TPA window.  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 On my evaluation patient states that she woke up with no symptoms and that symptom onset was 0830 this morning. Code Stroke Activated. Patient taken directly to CT.  NIH Scale 1 for mild-moderate sensation loss: less sharp in right upper extremity when compared to left.   11:10 AM CT head demonstrates no acute process. Pt on with Teleneurology.  Tele neuro Dr. Leonel Ramsay recommends CTA head and neck then ER to ER transfer for MRI.   Patient accepted by ED physician Dr. Langston Masker to [facility].   12:20 PM CTA demonstrates: 1. Negative for large vessel occlusion. 2. Stable CTA head and neck since 09/02/2021. Isolated though age advanced atherosclerosis at the right ICA origin without hemodynamically significant stenosis. 3. Layering pleural effusions are new since December. Recommend correlation with dedicated chest imaging.  EMS at [facility]. Pt stable.          Final Clinical Impression(s) / ED Diagnoses Final diagnoses:  Stroke-like symptom    Rx / DC Orders ED Discharge Orders     None         Lianne Cure, DO 65/78/46 [id number]

## 2021-09-09 NOTE — ED Notes (Signed)
Pt transported to MRI 

## 2021-09-09 NOTE — ED Notes (Signed)
Lauren Delgado with [facility] called for transport Ed to ED

## 2021-09-09 NOTE — ED Notes (Signed)
MRI called to inform that is here from [facility]. Pt arrives via [facility].

## 2021-09-11 LAB — PROTHROMBIN GENE MUTATION

## 2021-09-15 ENCOUNTER — Other Ambulatory Visit (HOSPITAL_COMMUNITY): Payer: Self-pay

## 2021-09-15 ENCOUNTER — Telehealth (HOSPITAL_COMMUNITY): Payer: Self-pay

## 2021-09-15 NOTE — Telephone Encounter (Signed)
Transitions of Care Pharmacy  ° °Call attempted for a pharmacy transitions of care follow-up. HIPAA appropriate voicemail was left with call back information provided.  ° °Call attempt #1. Will follow-up in 2-3 days.  °  °

## 2021-09-18 ENCOUNTER — Other Ambulatory Visit (HOSPITAL_COMMUNITY): Payer: Self-pay

## 2021-09-18 ENCOUNTER — Telehealth (HOSPITAL_COMMUNITY): Payer: Self-pay | Admitting: Pharmacy Technician

## 2021-09-18 NOTE — Telephone Encounter (Signed)
Transitions of Care Pharmacy   Call attempted for a pharmacy transitions of care follow-up. HIPAA appropriate voicemail was left.   Call attempt #2. Will follow-up in 2-3 days.   Parthenia Ames, PharmD

## 2021-09-19 ENCOUNTER — Other Ambulatory Visit (HOSPITAL_COMMUNITY): Payer: Self-pay

## 2021-09-19 ENCOUNTER — Telehealth (HOSPITAL_COMMUNITY): Payer: Self-pay | Admitting: Pharmacist

## 2021-09-19 NOTE — Telephone Encounter (Signed)
Transitions of Care Pharmacy   Call attempted for a pharmacy transitions of care follow-up. HIPAA appropriate voicemail was left with call back information provided.   Call attempt #3. No further follow-up necessary

## 2021-10-02 ENCOUNTER — Other Ambulatory Visit (HOSPITAL_COMMUNITY): Payer: Self-pay

## 2021-10-04 ENCOUNTER — Other Ambulatory Visit: Payer: Self-pay

## 2021-10-04 ENCOUNTER — Other Ambulatory Visit (HOSPITAL_COMMUNITY): Payer: Self-pay

## 2021-10-04 ENCOUNTER — Ambulatory Visit (INDEPENDENT_AMBULATORY_CARE_PROVIDER_SITE_OTHER): Payer: Medicaid Other | Admitting: Primary Care

## 2021-10-04 ENCOUNTER — Encounter (INDEPENDENT_AMBULATORY_CARE_PROVIDER_SITE_OTHER): Payer: Self-pay | Admitting: Primary Care

## 2021-10-04 VITALS — BP 160/106 | HR 96 | Temp 97.7°F | Ht 62.0 in | Wt 273.0 lb

## 2021-10-04 DIAGNOSIS — Z09 Encounter for follow-up examination after completed treatment for conditions other than malignant neoplasm: Secondary | ICD-10-CM | POA: Diagnosis not present

## 2021-10-04 DIAGNOSIS — Z7689 Persons encountering health services in other specified circumstances: Secondary | ICD-10-CM | POA: Diagnosis not present

## 2021-10-04 DIAGNOSIS — I639 Cerebral infarction, unspecified: Secondary | ICD-10-CM | POA: Diagnosis not present

## 2021-10-04 DIAGNOSIS — E119 Type 2 diabetes mellitus without complications: Secondary | ICD-10-CM | POA: Diagnosis not present

## 2021-10-04 DIAGNOSIS — Z6841 Body Mass Index (BMI) 40.0 and over, adult: Secondary | ICD-10-CM

## 2021-10-04 DIAGNOSIS — I10 Essential (primary) hypertension: Secondary | ICD-10-CM

## 2021-10-04 MED ORDER — SEMAGLUTIDE (1 MG/DOSE) 4 MG/3ML ~~LOC~~ SOPN
1.0000 mg | PEN_INJECTOR | SUBCUTANEOUS | 4 refills | Status: DC
  Filled 2021-10-04: qty 3, 28d supply, fill #0

## 2021-10-04 MED ORDER — HYDROCHLOROTHIAZIDE 25 MG PO TABS
25.0000 mg | ORAL_TABLET | Freq: Every day | ORAL | 3 refills | Status: DC
  Filled 2021-10-04: qty 90, 90d supply, fill #0

## 2021-10-04 MED ORDER — VALSARTAN 40 MG PO TABS
40.0000 mg | ORAL_TABLET | Freq: Every day | ORAL | 3 refills | Status: DC
  Filled 2021-10-04: qty 30, 30d supply, fill #0
  Filled 2021-10-29 – 2021-11-07 (×2): qty 90, 90d supply, fill #1
  Filled 2021-11-08: qty 30, 30d supply, fill #1
  Filled 2021-11-08 (×2): qty 90, 90d supply, fill #1

## 2021-10-04 MED ORDER — OZEMPIC (0.25 OR 0.5 MG/DOSE) 2 MG/1.5ML ~~LOC~~ SOPN
0.2500 mg | PEN_INJECTOR | SUBCUTANEOUS | 1 refills | Status: DC
  Filled 2021-10-04: qty 1.5, 56d supply, fill #0
  Filled 2021-10-05: qty 1.5, 28d supply, fill #0
  Filled 2021-11-21: qty 1.5, 28d supply, fill #1

## 2021-10-04 NOTE — Patient Instructions (Addendum)
Medication changes increase amlodipine to 10 mg daily currently on 5 mg will be at the pharmacy you will take 1 a day.  Added hydrochlorothiazide 25 mg take daily  semaglutide Injection What is this medication? SEMAGLUTIDE (SEM a GLOO tide) treats type 2 diabetes. It works by increasing insulin levels in your body, which decreases your blood sugar (glucose). It also reduces the amount of sugar released into the blood and slows down your digestion. It can also be used to lower the risk of heart attack and stroke in people with type 2 diabetes. Changes to diet and exercise are often combined with this medication. This medicine may be used for other purposes; ask your health care provider or pharmacist if you have questions. COMMON BRAND NAME(S): OZEMPIC What should I tell my care team before I take this medication? They need to know if you have any of these conditions: Endocrine tumors (MEN 2) or if someone in your family had these tumors Eye disease, vision problems History of pancreatitis Kidney disease Stomach problems Thyroid cancer or if someone in your family had thyroid cancer An unusual or allergic reaction to semaglutide, other medications, foods, dyes, or preservatives Pregnant or trying to get pregnant Breast-feeding How should I use this medication? This medication is for injection under the skin of your upper leg (thigh), stomach area, or upper arm. It is given once every week (every 7 days). You will be taught how to prepare and give this medication. Use exactly as directed. Take your medication at regular intervals. Do not take it more often than directed. If you use this medication with insulin, you should inject this medication and the insulin separately. Do not mix them together. Do not give the injections right next to each other. Change (rotate) injection sites with each injection. It is important that you put your used needles and syringes in a special sharps container. Do  not put them in a trash can. If you do not have a sharps container, call your pharmacist or care team to get one. A special MedGuide will be given to you by the pharmacist with each prescription and refill. Be sure to read this information carefully each time. This medication comes with INSTRUCTIONS FOR USE. Ask your pharmacist for directions on how to use this medication. Read the information carefully. Talk to your pharmacist or care team if you have questions. Talk to your care team about the use of this medication in children. Special care may be needed. Overdosage: If you think you have taken too much of this medicine contact a poison control center or emergency room at once. NOTE: This medicine is only for you. Do not share this medicine with others. What if I miss a dose? If you miss a dose, take it as soon as you can within 5 days after the missed dose. Then take your next dose at your regular weekly time. If it has been longer than 5 days after the missed dose, do not take the missed dose. Take the next dose at your regular time. Do not take double or extra doses. If you have questions about a missed dose, contact your care team for advice. What may interact with this medication? Other medications for diabetes Many medications may cause changes in blood sugar, these include: Alcohol containing beverages Antiviral medications for HIV or AIDS Aspirin and aspirin-like medications Certain medications for blood pressure, heart disease, irregular heart beat Chromium Diuretics Female hormones, such as estrogens or progestins, birth control pills  Fenofibrate Gemfibrozil Isoniazid Lanreotide Female hormones or anabolic steroids MAOIs like Carbex, Eldepryl, Marplan, Nardil, and Parnate Medications for weight loss Medications for allergies, asthma, cold, or cough Medications for depression, anxiety, or psychotic disturbances Niacin Nicotine NSAIDs, medications for pain and inflammation, like  ibuprofen or naproxen Octreotide Pasireotide Pentamidine Phenytoin Probenecid Quinolone antibiotics such as ciprofloxacin, levofloxacin, ofloxacin Some herbal dietary supplements Steroid medications such as prednisone or cortisone Sulfamethoxazole; trimethoprim Thyroid hormones Some medications can hide the warning symptoms of low blood sugar (hypoglycemia). You may need to monitor your blood sugar more closely if you are taking one of these medications. These include: Beta-blockers, often used for high blood pressure or heart problems (examples include atenolol, metoprolol, propranolol) Clonidine Guanethidine Reserpine This list may not describe all possible interactions. Give your health care provider a list of all the medicines, herbs, non-prescription drugs, or dietary supplements you use. Also tell them if you smoke, drink alcohol, or use illegal drugs. Some items may interact with your medicine. What should I watch for while using this medication? Visit your care team for regular checks on your progress. Drink plenty of fluids while taking this medication. Check with your care team if you get an attack of severe diarrhea, nausea, and vomiting. The loss of too much body fluid can make it dangerous for you to take this medication. A test called the HbA1C (A1C) will be monitored. This is a simple blood test. It measures your blood sugar control over the last 2 to 3 months. You will receive this test every 3 to 6 months. Learn how to check your blood sugar. Learn the symptoms of low and high blood sugar and how to manage them. Always carry a quick-source of sugar with you in case you have symptoms of low blood sugar. Examples include hard sugar candy or glucose tablets. Make sure others know that you can choke if you eat or drink when you develop serious symptoms of low blood sugar, such as seizures or unconsciousness. They must get medical help at once. Tell your care team if you have high  blood sugar. You might need to change the dose of your medication. If you are sick or exercising more than usual, you might need to change the dose of your medication. Do not skip meals. Ask your care team if you should avoid alcohol. Many nonprescription cough and cold products contain sugar or alcohol. These can affect blood sugar. Pens should never be shared. Even if the needle is changed, sharing may result in passing of viruses like hepatitis or HIV. Wear a medical ID bracelet or chain, and carry a card that describes your disease and details of your medication and dosage times. Do not become pregnant while taking this medication. Women should inform their care team if they wish to become pregnant or think they might be pregnant. There is a potential for serious side effects to an unborn child. Talk to your care team for more information. What side effects may I notice from receiving this medication? Side effects that you should report to your care team as soon as possible: Allergic reactions--skin rash, itching, hives, swelling of the face, lips, tongue, or throat Change in vision Dehydration--increased thirst, dry mouth, feeling faint or lightheaded, headache, dark yellow or brown urine Gallbladder problems--severe stomach pain, nausea, vomiting, fever Heart palpitations--rapid, pounding, or irregular heartbeat Kidney injury--decrease in the amount of urine, swelling of the ankles, hands, or feet Pancreatitis--severe stomach pain that spreads to your back or gets worse  after eating or when touched, fever, nausea, vomiting Thyroid cancer--new mass or lump in the neck, pain or trouble swallowing, trouble breathing, hoarseness Side effects that usually do not require medical attention (report to your care team if they continue or are bothersome): Diarrhea Loss of appetite Nausea Stomach pain Vomiting This list may not describe all possible side effects. Call your doctor for medical advice  about side effects. You may report side effects to FDA at 1-800-FDA-1088. Where should I keep my medication? Keep out of the reach of children. Store unopened pens in a refrigerator between 2 and 8 degrees C (36 and 46 degrees F). Do not freeze. Protect from light and heat. After you first use the pen, it can be stored for 56 days at room temperature between 15 and 30 degrees C (59 and 86 degrees F) or in a refrigerator. Throw away your used pen after 56 days or after the expiration date, whichever comes first. Do not store your pen with the needle attached. If the needle is left on, medication may leak from the pen. NOTE: This sheet is a summary. It may not cover all possible information. If you have questions about this medicine, talk to your doctor, pharmacist, or health care provider.  2022 [employer]/Gold Standard (2020-11-24 00:00:00)
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
## 2021-10-05 ENCOUNTER — Other Ambulatory Visit (HOSPITAL_COMMUNITY): Payer: Self-pay

## 2021-10-06 ENCOUNTER — Other Ambulatory Visit (HOSPITAL_COMMUNITY): Payer: Self-pay

## 2021-10-10 ENCOUNTER — Ambulatory Visit (INDEPENDENT_AMBULATORY_CARE_PROVIDER_SITE_OTHER): Payer: Medicaid Other | Admitting: Primary Care

## 2021-10-10 ENCOUNTER — Other Ambulatory Visit: Payer: Self-pay

## 2021-10-10 ENCOUNTER — Encounter (INDEPENDENT_AMBULATORY_CARE_PROVIDER_SITE_OTHER): Payer: Self-pay | Admitting: Primary Care

## 2021-10-10 ENCOUNTER — Other Ambulatory Visit (HOSPITAL_COMMUNITY): Payer: Self-pay

## 2021-10-10 VITALS — BP 148/96 | HR 95 | Temp 98.0°F | Ht 62.0 in | Wt 282.6 lb

## 2021-10-10 DIAGNOSIS — N390 Urinary tract infection, site not specified: Secondary | ICD-10-CM

## 2021-10-10 DIAGNOSIS — R319 Hematuria, unspecified: Secondary | ICD-10-CM

## 2021-10-10 DIAGNOSIS — I10 Essential (primary) hypertension: Secondary | ICD-10-CM

## 2021-10-10 DIAGNOSIS — N898 Other specified noninflammatory disorders of vagina: Secondary | ICD-10-CM | POA: Diagnosis not present

## 2021-10-10 DIAGNOSIS — F418 Other specified anxiety disorders: Secondary | ICD-10-CM | POA: Diagnosis not present

## 2021-10-10 LAB — POCT URINALYSIS DIP (CLINITEK)
Bilirubin, UA: NEGATIVE
Glucose, UA: 100 mg/dL — AB
Ketones, POC UA: NEGATIVE mg/dL
Leukocytes, UA: NEGATIVE
Nitrite, UA: POSITIVE — AB
POC PROTEIN,UA: >=300 — AB
Spec Grav, UA: 1.025 (ref 1.010–1.025)
Urobilinogen, UA: 1 E.U./dL
pH, UA: 6 (ref 5.0–8.0)

## 2021-10-10 MED ORDER — NITROFURANTOIN MONOHYD MACRO 100 MG PO CAPS: 100.0000 mg | ORAL_CAPSULE | Freq: Two times a day (BID) | ORAL | 0 refills | Status: DC

## 2021-10-10 MED ORDER — FLUCONAZOLE 150 MG PO TABS: 150.0000 mg | ORAL_TABLET | Freq: Once | ORAL | 0 refills | Status: AC

## 2021-10-10 NOTE — Patient Instructions (Addendum)
Urinary Tract Infection, Adult A urinary tract infection (UTI) is an infection of any part of the urinary tract. The urinary tract includes the kidneys, ureters, bladder, and urethra. These organs make, store, and get rid of urine in the body. An upper UTI affects the ureters and kidneys. A lower UTI affects the bladder and urethra. What are the causes? Most urinary tract infections are caused by bacteria in your genital area around your urethra, where urine leaves your body. These bacteria grow and cause inflammation of your urinary tract. What increases the risk? You are more likely to develop this condition if: You have a urinary catheter that stays in place. You are not able to control when you urinate or have a bowel movement (incontinence). You are female and you: Use a spermicide or diaphragm for birth control. Have low estrogen levels. Are pregnant. You have certain genes that increase your risk. You are sexually active. You take antibiotic medicines. You have a condition that causes your flow of urine to slow down, such as: An enlarged prostate, if you are female. Blockage in your urethra. A kidney stone. A nerve condition that affects your bladder control (neurogenic bladder). Not getting enough to drink, or not urinating often. You have certain medical conditions, such as: Diabetes. A weak disease-fighting system (immunesystem). Sickle cell disease. Gout. Spinal cord injury. What are the signs or symptoms? Symptoms of this condition include: Needing to urinate right away (urgency). Frequent urination. This may include small amounts of urine each time you urinate. Pain or burning with urination. Blood in the urine. Urine that smells bad or unusual. Trouble urinating. Cloudy urine. Vaginal discharge, if you are female. Pain in the abdomen or the lower back. You may also have: Vomiting or a decreased appetite. Confusion. Irritability or tiredness. A fever or  chills. Diarrhea. The first symptom in older adults may be confusion. In some cases, they may not have any symptoms until the infection has worsened. How is this diagnosed? This condition is diagnosed based on your medical history and a physical exam. You may also have other tests, including: Urine tests. Blood tests. Tests for STIs (sexually transmitted infections). If you have had more than one UTI, a cystoscopy or imaging studies may be done to determine the cause of the infections. How is this treated? Treatment for this condition includes: Antibiotic medicine. Over-the-counter medicines to treat discomfort. Drinking enough water to stay hydrated. If you have frequent infections or have other conditions such as a kidney stone, you may need to see a health care provider who specializes in the urinary tract (urologist). In rare cases, urinary tract infections can cause sepsis. Sepsis is a life-threatening condition that occurs when the body responds to an infection. Sepsis is treated in the hospital with IV antibiotics, fluids, and other medicines. Follow these instructions at home: Medicines Take over-the-counter and prescription medicines only as told by your health care provider. If you were prescribed an antibiotic medicine, take it as told by your health care provider. Do not stop using the antibiotic even if you start to feel better. General instructions Make sure you: Empty your bladder often and completely. Do not hold urine for long periods of time. Empty your bladder after sex. Wipe from front to back after urinating or having a bowel movement if you are female. Use each tissue only one time when you wipe. Drink enough fluid to keep your urine pale yellow. Keep all follow-up visits. This is important. Contact a health care provider   if: Your symptoms do not get better after 1-2 days. Your symptoms go away and then return. Get help right away if: You have severe pain in your  back or your lower abdomen. You have a fever or chills. You have nausea or vomiting. Summary A urinary tract infection (UTI) is an infection of any part of the urinary tract, which includes the kidneys, ureters, bladder, and urethra. Most urinary tract infections are caused by bacteria in your genital area. Treatment for this condition often includes antibiotic medicines. If you were prescribed an antibiotic medicine, take it as told by your health care provider. Do not stop using the antibiotic even if you start to feel better. Keep all follow-up visits. This is important. This information is not intended to replace advice given to you by your health care provider. Make sure you discuss any questions you have with your health care provider. Document Revised: 04/01/2020 Document Reviewed: 04/01/2020 Elsevier Patient Education  2022 Elsevier Inc.  
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
## 2021-10-11 ENCOUNTER — Encounter: Payer: Self-pay | Admitting: Physician Assistant

## 2021-10-11 ENCOUNTER — Encounter: Payer: Self-pay | Admitting: Radiology

## 2021-10-11 ENCOUNTER — Other Ambulatory Visit: Payer: Self-pay | Admitting: *Deleted

## 2021-10-11 ENCOUNTER — Ambulatory Visit (INDEPENDENT_AMBULATORY_CARE_PROVIDER_SITE_OTHER): Payer: Medicaid Other | Admitting: Nurse Practitioner

## 2021-10-11 VITALS — BP 176/110 | HR 98 | Ht 62.0 in | Wt 273.0 lb

## 2021-10-11 DIAGNOSIS — E785 Hyperlipidemia, unspecified: Secondary | ICD-10-CM

## 2021-10-11 DIAGNOSIS — I5042 Chronic combined systolic (congestive) and diastolic (congestive) heart failure: Secondary | ICD-10-CM | POA: Diagnosis not present

## 2021-10-11 DIAGNOSIS — Z794 Long term (current) use of insulin: Secondary | ICD-10-CM | POA: Diagnosis not present

## 2021-10-11 DIAGNOSIS — E118 Type 2 diabetes mellitus with unspecified complications: Secondary | ICD-10-CM

## 2021-10-11 DIAGNOSIS — I2510 Atherosclerotic heart disease of native coronary artery without angina pectoris: Secondary | ICD-10-CM | POA: Diagnosis not present

## 2021-10-11 DIAGNOSIS — I10 Essential (primary) hypertension: Secondary | ICD-10-CM

## 2021-10-11 DIAGNOSIS — Z8673 Personal history of transient ischemic attack (TIA), and cerebral infarction without residual deficits: Secondary | ICD-10-CM

## 2021-10-11 MED ORDER — BLOOD PRESSURE MONITORING KIT: 1.0000 | PACK | Freq: Every day | 0 refills | Status: DC

## 2021-10-11 MED ORDER — POTASSIUM CHLORIDE CRYS ER 20 MEQ PO TBCR: 20.0000 meq | EXTENDED_RELEASE_TABLET | Freq: Every day | ORAL | 3 refills | Status: DC

## 2021-10-11 MED ORDER — FUROSEMIDE 40 MG PO TABS: 40.0000 mg | ORAL_TABLET | Freq: Every day | ORAL | 3 refills | Status: DC

## 2021-10-11 MED ORDER — EMPAGLIFLOZIN 10 MG PO TABS: 10.0000 mg | ORAL_TABLET | Freq: Every day | ORAL | 11 refills | Status: DC

## 2021-10-11 MED ORDER — CARVEDILOL 12.5 MG PO TABS
12.5000 mg | ORAL_TABLET | Freq: Two times a day (BID) | ORAL | 3 refills | Status: DC
Start: 2021-10-11 — End: 2022-03-03

## 2021-10-11 NOTE — Progress Notes (Signed)
Enrolled patient for a 30 day [employer] Event Monitor to be mailed to patients home  

## 2021-10-11 NOTE — Patient Instructions (Addendum)
Medication Instructions:  Your physician has recommended you make the following change in your medication:   STOP HCTZ  START Jardiance 10 mg taking 1 daily  START Lasix 40 mg taking 1 daily  START Carvedilol 12.5 mg taking 1 twice a day   START Potassium 20 meq taking 1 daily   *If you need a refill on your cardiac medications before your next appointment, please call your pharmacy*   Lab Work: TODAY:  BMET  1 WEEK:  10/18/21, COME TO THE OFFICE FOR:  FASTING LIPID & BMET  If you have labs (blood work) drawn today and your tests are completely normal, you will receive your results only by: [place] (if you have MyChart) OR A paper copy in the mail If you have any lab test that is abnormal or we need to change your treatment, we will call you to review the results.   Testing/Procedures: [employer] Cardiac Event Monitor Instructions Your physician has requested you wear your cardiac event monitor for 30 days, (1-30). [employer] may call or text to confirm a shipping address. The monitor will be sent to a land address via UPS. [employer] will not ship a monitor to a PO BOX. It typically takes 3-5 days to receive your monitor after it has been enrolled. [employer] will assist with USPS tracking if your package is delayed. The telephone number for [employer] is [phone]. Once you have received your monitor, please review the enclosed instructions. Instruction tutorials can also be viewed under help and settings on the enclosed cell phone. Your monitor has already been registered assigning a specific monitor serial # to you.  Applying the monitor Remove cell phone from case and turn it on. The cell phone works as Dealer and needs to be within [employer] of you at all times. The cell phone will need to be charged on a daily basis. We recommend you plug the cell phone into the enclosed charger at your bedside table every night.  Monitor batteries: You will receive  two monitor batteries labelled #1 and #2. These are your recorders. Plug battery #2 onto the second connection on the enclosed charger. Keep one battery on the charger at all times. This will keep the monitor battery deactivated. It will also keep it fully charged for when you need to switch your monitor batteries. A small light will be blinking on the battery emblem when it is charging. The light on the battery emblem will remain on when the battery is fully charged.  Open package of a Monitor strip. Insert battery #1 into black hood on strip and gently squeeze monitor battery onto connection as indicated in instruction booklet. Set aside while preparing skin.  Choose location for your strip, vertical or horizontal, as indicated in the instruction booklet. Shave to remove all hair from location. There cannot be any lotions, oils, powders, or colognes on skin where monitor is to be applied. Wipe skin clean with enclosed Saline wipe. Dry skin completely.  Peel paper labeled #1 off the back of the Monitor strip exposing the adhesive. Place the monitor on the chest in the vertical or horizontal position shown in the instruction booklet. One arrow on the monitor strip must be pointing upward. Carefully remove paper labeled #2, attaching remainder of strip to your skin. Try not to create any folds or wrinkles in the strip as you apply it.  Firmly press and release the circle in the center of the monitor battery. You will hear a small beep.  This is turning the monitor battery on. The heart emblem on the monitor battery will light up every 5 seconds if the monitor battery in turned on and connected to the patient securely. Do not push and hold the circle down as this turns the monitor battery off. The cell phone will locate the monitor battery. A screen will appear on the cell phone checking the connection of your monitor strip. This may read poor connection initially but change to good connection  within the next minute. Once your monitor accepts the connection you will hear a series of 3 beeps followed by a climbing crescendo of beeps. A screen will appear on the cell phone showing the two monitor strip placement options. Touch the picture that demonstrates where you applied the monitor strip.  Your monitor strip and battery are waterproof. You are able to shower, bathe, or swim with the monitor on. They just ask you do not submerge deeper than 3 feet underwater. We recommend removing the monitor if you are swimming in a lake, river, or ocean.  Your monitor battery will need to be switched to a fully charged monitor battery approximately once a week. The cell phone will alert you of an action which needs to be made.  On the cell phone, tap for details to reveal connection status, monitor battery status, and cell phone battery status. The green dots indicates your monitor is in good status. A red dot indicates there is something that needs your attention.  To record a symptom, click the circle on the monitor battery. In 30-60 seconds a list of symptoms will appear on the cell phone. Select your symptom and tap save. Your monitor will record a sustained or significant arrhythmia regardless of you clicking the button. Some patients do not feel the heart rhythm irregularities. [employer] will notify us of any serious or critical events.  Refer to instruction booklet for instructions on switching batteries, changing strips, the Do not disturb or Pause features, or any additional questions.  Call [employer] at [phone], to confirm your monitor is transmitting and record your baseline. They will answer any questions you may have regarding the monitor instructions at that time.  Returning the monitor to [place] all equipment back into blue box. Peel off strip of paper to expose adhesive and close box securely. There is a prepaid UPS shipping label on this box. Drop in a UPS  drop box, or at a UPS facility like [employer]. You may also contact [employer] to arrange UPS to pick up monitor package at your home.    Follow-Up: At [facility], you and your health needs are our priority.  As part of our continuing mission to provide you with exceptional heart care, we have created designated Provider Care Teams.  These Care Teams include your primary Cardiologist (physician) and Advanced Practice Providers (APPs -  Physician Assistants and Nurse Practitioners) who all work together to provide you with the care you need, when you need it.  We recommend signing up for the patient portal called "MyChart".  Sign up information is provided on this After Visit Summary.  MyChart is used to connect with patients for Virtual Visits (Telemedicine).  Patients are able to view lab/test results, encounter notes, upcoming appointments, etc.  Non-urgent messages can be sent to your provider as well.   To learn more about what you can do with MyChart, go to [email or web address].    Your next appointment:   1 month(s)  11/09/2021 ARRIVE AT 1:15  The format for your next appointment:   In Person  Provider:   Pixie Casino, MD  or Rosaria Ferries, PA-C, Coletta Memos, FNP, Fabian Sharp, PA-C, Sande Rives, PA-C, Vikki Ports, PA-C, Caron Presume, PA-C, Jory Sims, DNP, ANP, Almyra Deforest, PA-C, or Diona Browner, NP        Other Instructions Your physician recommends that you weigh, daily, at the same time every day, and in the same amount of clothing. If you notice a weight gain of 3 lbs in 24 hours or 5 lbs in a week, please let us know.  Monitor your blood pressure, at least 1-2 hours after your morning medications, keep them on a log, bring with you to your next appointment.
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
## 2021-10-12 ENCOUNTER — Telehealth: Payer: Self-pay | Admitting: Nurse Practitioner

## 2021-10-12 ENCOUNTER — Other Ambulatory Visit: Payer: Self-pay

## 2021-10-12 NOTE — Telephone Encounter (Deleted)
**Note De-Identified Audre Cenci Obfuscation** Jardiance PA started through covermymeds. KeyJeronimo Norma

## 2021-10-12 NOTE — Telephone Encounter (Signed)
Pt c/o medication issue:  1. Name of Medication:  empagliflozin (JARDIANCE) 10 MG TABS tablet  2. How are you currently taking this medication (dosage and times per day)? Not currently taking   3. Are you having a reaction (difficulty breathing--STAT)? No  4. What is your medication issue? Patient is calling that she needs PA in order to get this medication.

## 2021-10-12 NOTE — Telephone Encounter (Signed)
Spoke with patient to inform her I will send preauthorization request for jardiance to our pharmd. Verified her pharmacy in [place], [place]

## 2021-10-12 NOTE — Telephone Encounter (Signed)
Sent preauthorization for Jardiance to [employer] and Derrick Ravel.

## 2021-10-12 NOTE — Telephone Encounter (Signed)
**Note De-Identified Jerrel Tiberio Obfuscation** Elisabeth Most Key: Estrella Deeds - PA Case ID: [id number] Outcome: Approved today Request Reference Number: [id number].  JARDIANCE TAB 10MG  is approved through 10/12/2022.  Drug: Jardiance 10MG  tablets Form: [employer] Electronic Prior Authorization Form (2017 NCPDP)  I have notified the pt and [facility] [address] [place] [phone] of this approval.

## 2021-10-13 LAB — BASIC METABOLIC PANEL
BUN/Creatinine Ratio: 15 (ref 9–23)
BUN: 13 mg/dL (ref 6–20)
CO2: 26 mmol/L (ref 20–29)
Calcium: 9.3 mg/dL (ref 8.7–10.2)
Chloride: 103 mmol/L (ref 96–106)
Creatinine, Ser: 0.89 mg/dL (ref 0.57–1.00)
Glucose: 90 mg/dL (ref 70–99)
Potassium: 3.5 mmol/L (ref 3.5–5.2)
Sodium: 145 mmol/L — ABNORMAL HIGH (ref 134–144)
eGFR: 87 mL/min/{1.73_m2} (ref >59)

## 2021-10-16 ENCOUNTER — Other Ambulatory Visit: Payer: Self-pay | Admitting: Student

## 2021-10-17 MED ORDER — AMLODIPINE BESYLATE 10 MG PO TABS
10.0000 mg | ORAL_TABLET | Freq: Every day | ORAL | 3 refills | Status: DC
Start: 2021-10-17 — End: 2021-11-09
  Filled 2021-10-17 – 2021-10-18 (×2): qty 90, 90d supply, fill #0

## 2021-10-18 ENCOUNTER — Other Ambulatory Visit (HOSPITAL_COMMUNITY): Payer: Self-pay

## 2021-10-18 ENCOUNTER — Ambulatory Visit (INDEPENDENT_AMBULATORY_CARE_PROVIDER_SITE_OTHER): Payer: Medicaid Other

## 2021-10-18 ENCOUNTER — Ambulatory Visit (INDEPENDENT_AMBULATORY_CARE_PROVIDER_SITE_OTHER): Payer: Medicaid Other | Admitting: Primary Care

## 2021-10-18 DIAGNOSIS — I10 Essential (primary) hypertension: Secondary | ICD-10-CM | POA: Diagnosis not present

## 2021-10-18 DIAGNOSIS — E118 Type 2 diabetes mellitus with unspecified complications: Secondary | ICD-10-CM

## 2021-10-18 DIAGNOSIS — I2510 Atherosclerotic heart disease of native coronary artery without angina pectoris: Secondary | ICD-10-CM | POA: Diagnosis not present

## 2021-10-18 DIAGNOSIS — I5042 Chronic combined systolic (congestive) and diastolic (congestive) heart failure: Secondary | ICD-10-CM | POA: Diagnosis not present

## 2021-10-18 DIAGNOSIS — I4891 Unspecified atrial fibrillation: Secondary | ICD-10-CM

## 2021-10-18 DIAGNOSIS — E785 Hyperlipidemia, unspecified: Secondary | ICD-10-CM

## 2021-10-18 DIAGNOSIS — I639 Cerebral infarction, unspecified: Secondary | ICD-10-CM | POA: Diagnosis not present

## 2021-10-18 DIAGNOSIS — Z8673 Personal history of transient ischemic attack (TIA), and cerebral infarction without residual deficits: Secondary | ICD-10-CM | POA: Diagnosis not present

## 2021-10-18 DIAGNOSIS — Z794 Long term (current) use of insulin: Secondary | ICD-10-CM | POA: Diagnosis not present

## 2021-10-18 LAB — BASIC METABOLIC PANEL
BUN/Creatinine Ratio: 14 (ref 9–23)
BUN: 14 mg/dL (ref 6–20)
CO2: 24 mmol/L (ref 20–29)
Calcium: 9 mg/dL (ref 8.7–10.2)
Chloride: 100 mmol/L (ref 96–106)
Creatinine, Ser: 1.02 mg/dL — ABNORMAL HIGH (ref 0.57–1.00)
Glucose: 155 mg/dL — ABNORMAL HIGH (ref 70–99)
Potassium: 3.4 mmol/L — ABNORMAL LOW (ref 3.5–5.2)
Sodium: 138 mmol/L (ref 134–144)
eGFR: 74 mL/min/{1.73_m2} (ref >59)

## 2021-10-18 LAB — LIPID PANEL
Chol/HDL Ratio: 3.6 ratio (ref 0.0–4.4)
Cholesterol, Total: 167 mg/dL (ref 100–199)
HDL: 46 mg/dL (ref >39)
LDL Chol Calc (NIH): 96 mg/dL (ref 0–99)
Triglycerides: 144 mg/dL (ref 0–149)
VLDL Cholesterol Cal: 25 mg/dL (ref 5–40)

## 2021-10-20 ENCOUNTER — Other Ambulatory Visit: Payer: Self-pay

## 2021-10-20 DIAGNOSIS — E875 Hyperkalemia: Secondary | ICD-10-CM

## 2021-10-27 ENCOUNTER — Telehealth: Payer: Self-pay | Admitting: Clinical

## 2021-10-27 NOTE — Telephone Encounter (Signed)
I attempted to call pt per message from provider, No answer, left vm.

## 2021-10-30 ENCOUNTER — Other Ambulatory Visit (HOSPITAL_COMMUNITY): Payer: Self-pay

## 2021-10-30 DIAGNOSIS — E875 Hyperkalemia: Secondary | ICD-10-CM | POA: Diagnosis not present

## 2021-10-30 LAB — BASIC METABOLIC PANEL
BUN/Creatinine Ratio: 18 (ref 9–23)
BUN: 19 mg/dL (ref 6–20)
CO2: 26 mmol/L (ref 20–29)
Calcium: 8.4 mg/dL — ABNORMAL LOW (ref 8.7–10.2)
Chloride: 103 mmol/L (ref 96–106)
Creatinine, Ser: 1.06 mg/dL — ABNORMAL HIGH (ref 0.57–1.00)
Glucose: 147 mg/dL — ABNORMAL HIGH (ref 70–99)
Potassium: 3.4 mmol/L — ABNORMAL LOW (ref 3.5–5.2)
Sodium: 141 mmol/L (ref 134–144)
eGFR: 71 mL/min/{1.73_m2} (ref >59)

## 2021-10-31 ENCOUNTER — Other Ambulatory Visit (HOSPITAL_COMMUNITY): Payer: Self-pay

## 2021-10-31 ENCOUNTER — Other Ambulatory Visit: Payer: Self-pay

## 2021-10-31 MED ORDER — SPIRONOLACTONE 25 MG PO TABS: 12.5000 mg | ORAL_TABLET | Freq: Every day | ORAL | 3 refills | Status: DC

## 2021-11-02 ENCOUNTER — Other Ambulatory Visit (HOSPITAL_COMMUNITY): Payer: Self-pay

## 2021-11-03 ENCOUNTER — Other Ambulatory Visit (HOSPITAL_COMMUNITY): Payer: Self-pay

## 2021-11-06 NOTE — Telephone Encounter (Signed)
I attempted to call pt, no answer, left vm.

## 2021-11-07 ENCOUNTER — Other Ambulatory Visit (HOSPITAL_COMMUNITY): Payer: Self-pay

## 2021-11-08 ENCOUNTER — Other Ambulatory Visit (HOSPITAL_COMMUNITY): Payer: Self-pay
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
## 2021-11-09 ENCOUNTER — Encounter: Payer: Self-pay | Admitting: Nurse Practitioner

## 2021-11-09 ENCOUNTER — Ambulatory Visit (INDEPENDENT_AMBULATORY_CARE_PROVIDER_SITE_OTHER): Payer: Medicaid Other | Admitting: Nurse Practitioner

## 2021-11-09 ENCOUNTER — Other Ambulatory Visit (HOSPITAL_COMMUNITY): Payer: Self-pay

## 2021-11-09 ENCOUNTER — Other Ambulatory Visit: Payer: Self-pay

## 2021-11-09 VITALS — BP 120/80 | HR 91 | Ht 62.0 in | Wt 264.8 lb

## 2021-11-09 DIAGNOSIS — E118 Type 2 diabetes mellitus with unspecified complications: Secondary | ICD-10-CM

## 2021-11-09 DIAGNOSIS — Z794 Long term (current) use of insulin: Secondary | ICD-10-CM

## 2021-11-09 DIAGNOSIS — I5042 Chronic combined systolic (congestive) and diastolic (congestive) heart failure: Secondary | ICD-10-CM

## 2021-11-09 DIAGNOSIS — I10 Essential (primary) hypertension: Secondary | ICD-10-CM | POA: Diagnosis not present

## 2021-11-09 DIAGNOSIS — Z79899 Other long term (current) drug therapy: Secondary | ICD-10-CM | POA: Diagnosis not present

## 2021-11-09 DIAGNOSIS — E785 Hyperlipidemia, unspecified: Secondary | ICD-10-CM

## 2021-11-09 DIAGNOSIS — Z8673 Personal history of transient ischemic attack (TIA), and cerebral infarction without residual deficits: Secondary | ICD-10-CM

## 2021-11-09 DIAGNOSIS — I2510 Atherosclerotic heart disease of native coronary artery without angina pectoris: Secondary | ICD-10-CM | POA: Diagnosis not present

## 2021-11-09 MED ORDER — ENTRESTO 24-26 MG PO TABS: 1.0000 | ORAL_TABLET | Freq: Two times a day (BID) | ORAL | 3 refills | Status: DC

## 2021-11-09 MED ORDER — CLOPIDOGREL BISULFATE 75 MG PO TABS: 75.0000 mg | ORAL_TABLET | Freq: Every day | ORAL | 2 refills | Status: AC

## 2021-11-09 MED ORDER — ATORVASTATIN CALCIUM 80 MG PO TABS: 80.0000 mg | ORAL_TABLET | Freq: Every day | ORAL | 2 refills | Status: DC

## 2021-11-09 MED ORDER — AMLODIPINE BESYLATE 10 MG PO TABS: 10.0000 mg | ORAL_TABLET | Freq: Every day | ORAL | 3 refills | Status: DC

## 2021-11-09 NOTE — Patient Instructions (Addendum)
Medication Instructions:  ?Stop Valsartan as directed ?Start Entresto 24/26 mg one tablet twice daily  ? ?*If you need a refill on your cardiac medications before your next appointment, please call your pharmacy* ? ? ?Lab Work: ?Your physician recommends that you complete labs today and in 2 weeks ?BMET ? ? ?If you have labs (blood work) drawn today and your tests are completely normal, you will receive your results only by: ?MyChart Message (if you have MyChart) OR ?A paper copy in the mail ?If you have any lab test that is abnormal or we need to change your treatment, we will call you to review the results. ? ? ?Testing/Procedures: ?NONE ordered at this time of appointment  ? ? ?Follow-Up: ?At [facility], you and your health needs are our priority.  As part of our continuing mission to provide you with exceptional heart care, we have created designated Provider Care Teams.  These Care Teams include your primary Cardiologist (physician) and Advanced Practice Providers (APPs -  Physician Assistants and Nurse Practitioners) who all work together to provide you with the care you need, when you need it. ? ?We recommend signing up for the patient portal called "MyChart".  Sign up information is provided on this After Visit Summary.  MyChart is used to connect with patients for Virtual Visits (Telemedicine).  Patients are able to view lab/test results, encounter notes, upcoming appointments, etc.  Non-urgent messages can be sent to your provider as well.   ?To learn more about what you can do with MyChart, go to [email or web address].   ? ?Your next appointment:   ?3 month(s) ? ?The format for your next appointment:   ?In Person ? ?Provider:   ?Diona Browner, NP      ? ? ?Other Instructions ?Monitor Blood Pressure. Report if blood pressure reading is consistently greater than 130/80 or if your systolic (top  number) is consistently less 110.  ? ?Report weight if you gain 3 lbs over night or over 5 lbs in 1 week.   ? ?Referral sent to Neurology.  ?

## 2021-11-10 ENCOUNTER — Telehealth: Payer: Self-pay

## 2021-11-10 LAB — BASIC METABOLIC PANEL
BUN/Creatinine Ratio: 21 (ref 9–23)
BUN: 24 mg/dL — ABNORMAL HIGH (ref 6–20)
CO2: 19 mmol/L — ABNORMAL LOW (ref 20–29)
Calcium: 8.8 mg/dL (ref 8.7–10.2)
Chloride: 106 mmol/L (ref 96–106)
Creatinine, Ser: 1.17 mg/dL — ABNORMAL HIGH (ref 0.57–1.00)
Glucose: 100 mg/dL — ABNORMAL HIGH (ref 70–99)
Potassium: 3.9 mmol/L (ref 3.5–5.2)
Sodium: 143 mmol/L (ref 134–144)
eGFR: 63 mL/min/{1.73_m2} (ref >59)

## 2021-11-10 NOTE — Telephone Encounter (Addendum)
Spoke with pt. Pt was notified of results and recommendations of labs in 2 weeks as previously scheduled. ----- Message from [name], NP sent at 11/10/2021  1:47 PM EST ----- ?Recent labs show overall stable kidney function, creatinine is just slightly elevated previous labs, potassium is now normal.  Recommend continuing current medications and repeat BMET as scheduled in 2 weeks.  Follow-up as planned. Thank you. ?

## 2021-11-13 ENCOUNTER — Telehealth: Payer: Self-pay | Admitting: Nurse Practitioner

## 2021-11-13 MED ORDER — VALSARTAN 40 MG PO TABS: 40.0000 mg | ORAL_TABLET | Freq: Every day | ORAL | 3 refills | Status: DC

## 2021-11-13 NOTE — Telephone Encounter (Signed)
Returned call to patient and advised her of Diona Browner NP's recommendations.  ? ?Entresto removed from medication list and added as an intolerance.  ? ?Prescription for Valsartan '40mg'$  Once daily sent to patient preferred pharmacy.  ? ?Patient is aware of all instructions and verbalized understanding.  ? ?[name], NP  You 7 minutes ago (10:46 AM)  ? ?We will stop Entresto in that case and restart valsartan 40 mg daily.  I'm sorry that she felt so bad on the medication.  I am glad she notified us.  We should listed in her allergies.  Thank you.   ? ?

## 2021-11-13 NOTE — Telephone Encounter (Signed)
Pt c/o medication issue: ? ?1. Name of Medication: sacubitril-valsartan (ENTRESTO) 24-26 MG ? ?2. How are you currently taking this medication (dosage and times per day)? Did not take last night, took this morning.  ? ?3. Are you having a reaction (difficulty breathing--STAT)? Yes ? ?4. What is your medication issue? Patient is calling stating she started taking this medication on Friday. On Saturday she began having diarrhea and vomiting. She reports there is a bad smell with her vomit and she can smell it when she burps. She did not take the medication last night but did this morning and has already started dry heaving. Please advise.  ?

## 2021-11-13 NOTE — Telephone Encounter (Signed)
Returned call to patient who states that she started [facility] last Friday, and that evening she started getting nauseous, and started vomiting and having diarrhea. Patient states this continued into the weekend. Patient reports that she also burps a lot and reports a nasty after taste that causes her to vomit. Patient states that she did not take the Entresto last night and reports that she had no burping and she felt better. Patient states that this morning she tried the medication again and reports that the symptoms of nausea, burping, and vomiting returned. Patient reports that she was on Valsartan before and tolerated it well. Advised patient that I would forward message to Raquel Sarna, NP and PharmD for review and advice. Patient verbalized understanding.  ?

## 2021-11-17 ENCOUNTER — Other Ambulatory Visit (HOSPITAL_COMMUNITY): Payer: Self-pay

## 2021-11-21 ENCOUNTER — Other Ambulatory Visit: Payer: Self-pay | Admitting: Nurse Practitioner

## 2021-11-21 ENCOUNTER — Other Ambulatory Visit (HOSPITAL_COMMUNITY): Payer: Self-pay

## 2021-11-21 DIAGNOSIS — I2510 Atherosclerotic heart disease of native coronary artery without angina pectoris: Secondary | ICD-10-CM

## 2021-11-21 DIAGNOSIS — I10 Essential (primary) hypertension: Secondary | ICD-10-CM

## 2021-11-21 DIAGNOSIS — I639 Cerebral infarction, unspecified: Secondary | ICD-10-CM

## 2021-11-21 DIAGNOSIS — E118 Type 2 diabetes mellitus with unspecified complications: Secondary | ICD-10-CM

## 2021-11-21 DIAGNOSIS — I4891 Unspecified atrial fibrillation: Secondary | ICD-10-CM

## 2021-11-21 DIAGNOSIS — Z8673 Personal history of transient ischemic attack (TIA), and cerebral infarction without residual deficits: Secondary | ICD-10-CM

## 2021-11-21 DIAGNOSIS — E785 Hyperlipidemia, unspecified: Secondary | ICD-10-CM

## 2021-11-21 DIAGNOSIS — I5042 Chronic combined systolic (congestive) and diastolic (congestive) heart failure: Secondary | ICD-10-CM

## 2021-11-21 MED ORDER — OZEMPIC (0.25 OR 0.5 MG/DOSE) 2 MG/3ML ~~LOC~~ SOPN
PEN_INJECTOR | SUBCUTANEOUS | 0 refills | Status: DC
  Filled 2021-11-21: qty 3, 28d supply, fill #0

## 2021-11-22 LAB — HM DIABETES EYE EXAM

## 2021-11-25 ENCOUNTER — Other Ambulatory Visit: Payer: Self-pay

## 2021-11-28 ENCOUNTER — Telehealth: Payer: Self-pay

## 2021-11-28 NOTE — Telephone Encounter (Signed)
Lmom, to discuss results. Waiting on a return call.  

## 2021-11-29 ENCOUNTER — Other Ambulatory Visit (HOSPITAL_COMMUNITY): Payer: Self-pay

## 2021-11-30 ENCOUNTER — Other Ambulatory Visit (INDEPENDENT_AMBULATORY_CARE_PROVIDER_SITE_OTHER): Payer: Self-pay

## 2021-11-30 NOTE — Telephone Encounter (Signed)
Medication Refill - Medication: glipiZIDE (GLUCOTROL XL) 5 MG 24 hr tablet ?insulin isophane & regular human KwikPen (HUMULIN 70/30 MIX) (70-30) 100 UNIT/ML KwikPen ?Semaglutide,0.25 or 0.'5MG'$ /DOS, (OZEMPIC, 0.25 OR 0.5 MG/DOSE,) 2 MG/3ML SOPN ? ?Requesting pin tips for the needles  ? ?Has the patient contacted their pharmacy? Yes.   ?(Agent: If no, request that the patient contact the pharmacy for the refill. If patient does not wish to contact the pharmacy document the reason why and proceed with request.) ?(Agent: If yes, when and what did the pharmacy advise?) ? ?Preferred Pharmacy (with phone number or street name):  ?[id number] [id number] ?Phone: [phone]: [phone] ? ?Has the patient been seen for an appointment in the last year OR does the patient have an upcoming appointment? Yes.   ? ?Agent: Please be advised that RX refills may take up to 3 business days. We ask that you follow-up with your pharmacy. ? ?

## 2021-12-01 ENCOUNTER — Other Ambulatory Visit (HOSPITAL_COMMUNITY): Payer: Self-pay

## 2021-12-01 ENCOUNTER — Telehealth: Payer: Self-pay

## 2021-12-01 NOTE — Telephone Encounter (Signed)
Requested medication (s) are due for refill today- yes ? ?Requested medication (s) are on the active medication list -yes ? ?Future visit scheduled -yes ? ?Last refill: glipizide- 09/05/21 #30 2RF ?                 Humulin- 09/05/21 33m ?                 Ozempic -10/04/21 326m?                 Pen- 09/05/21 #100 ? ?Notes to clinic: Request RF: outside provider, Ozempic possible duplicate ? ?Requested Prescriptions  ?Pending Prescriptions Disp Refills  ? glipiZIDE (GLUCOTROL XL) 5 MG 24 hr tablet 30 tablet 2  ?  Sig: Take 1 tablet (5 mg total) by mouth daily with breakfast.  ?  ? Endocrinology:  Diabetes - Sulfonylureas Failed - 11/30/2021  3:17 PM  ?  ?  Failed - HBA1C is between 0 and 7.9 and within 180 days  ?  HbA1c, POC (controlled diabetic range)  ?Date Value Ref Range Status  ?02/11/2020 7.8 (A) 0.0 - 7.0 % Final  ? ?HbA1c POC (<> result, manual entry)  ?Date Value Ref Range Status  ?10/20/2020 >15.0 4.0 - 5.6 % Final  ? ?Hgb A1c MFr Bld  ?Date Value Ref Range Status  ?09/03/2021 14.7 (H) 4.8 - 5.6 % Final  ?  Comment:  ?  (NOTE) ?Pre diabetes:          5.7%-6.4% ? ?Diabetes:              >6.4% ? ?Glycemic control for   <7.0% ?adults with diabetes ?  ?  ?  ?  ?  Failed - Cr in normal range and within 360 days  ?  Creat  ?Date Value Ref Range Status  ?04/04/2015 0.73 0.50 - 1.10 mg/dL Final  ? ?Creatinine, Ser  ?Date Value Ref Range Status  ?11/09/2021 1.17 (H) 0.57 - 1.00 mg/dL Final  ? ?Creatinine, Urine  ?Date Value Ref Range Status  ?05/07/2020 278.95 mg/dL Final  ?  ?  ?  ?  Passed - Valid encounter within last 6 months  ?  Recent Outpatient Visits   ? ?      ? 1 month [facility] Ed[facility]in [facility]  ? 1 month ago [facility]  [facility] EdKerin [facility]erna[facility]  ? ?  ?  ?Future Appointments   ? ?        ? In 3 days [place] [facility]? In 2 months [name], Em[facility]ane GuntherNP [facility]  ? ?  ? ?  ?  ?  ? insulin isophane & regular human KwikPen (HUMULIN 70/30 MIX) (70-30) 100 UNIT/ML KwikPen 21 mL 0  ?  Sig: Inject 10 units every morning and evening.  ?  ? Endocrinology:  Diabetes - Insulins Failed - 11/30/2021  3:17 PM  ?  ?  Failed - HBA1C is between 0 and 7.9 and within 180 days  ?  HbA1c, POC (controlled diabetic range)  ?Date Value Ref Range Status  ?02/11/2020 7.8 (A) 0.0 - 7.0 % Final  ? ?HbA1c POC (<> result, manual entry)  ?Date Value Ref Range Status  ?10/20/2020 >15.0 4.0 - 5.6 % Final  ? ?Hgb A1c MFr Bld  ?Date Value Ref Range Status  ?09/03/2021 14.7 (  H) 4.8 - 5.6 % Final  ?  Comment:  ?  (NOTE) ?Pre diabetes:          5.7%-6.4% ? ?Diabetes:              >6.4% ? ?Glycemic control for   <7.0% ?adults with diabetes ?  ?  ?  ?  ?  Passed - Valid encounter within last 6 months  ?  Recent Outpatient Visits   ? ?      ? 1 month ago Vaginal odor  ? [facility] Kerin Perna, NP  ? 1 month ago Hospital discharge follow-up  ? [facility] Kerin Perna, NP  ? ?  ?  ?Future Appointments   ? ?        ? In 3 days Kerin Perna, NP [place]  ? In 2 months [name], Helane Gunther, NP [facility]  ? ?  ? ?  ?  ?  ? Semaglutide,0.25 or 0.'5MG'$ /DOS, (OZEMPIC, 0.25 OR 0.5 MG/DOSE,) 2 MG/3ML SOPN 3 mL 0  ?  Sig: Inject 0.'25mg'$  into the skin for 4 weeks, then increase to 0.'5mg'$  once a week for 4 weeks, then '1mg'$  every week  ?  ? There is no refill protocol information for this order  ?  ? Insulin Pen Needle 32G X 4 MM MISC 100 each 0  ?  Sig: Use to inject insulin up to 4 times daily as needed.  ?  ? Endocrinology: Diabetes - Testing Supplies Passed - 11/30/2021  3:17 PM  ?  ?  Passed - Valid encounter within last 12 months  ?  Recent Outpatient Visits   ? ?      ? 1 month ago Vaginal odor  ? [facility] Kerin Perna, NP  ? 1 month ago Hospital discharge follow-up   ? [facility] Kerin Perna, NP  ? ?  ?  ?Future Appointments   ? ?        ? In 3 days Kerin Perna, NP [place]  ? In 2 months [name], Helane Gunther, NP [facility], [facility]  ? ?  ? ?  ?  ?  ? ? ? ?Requested Prescriptions  ?Pending Prescriptions Disp Refills  ? glipiZIDE (GLUCOTROL XL) 5 MG 24 hr tablet 30 tablet 2  ?  Sig: Take 1 tablet (5 mg total) by mouth daily with breakfast.  ?  ? Endocrinology:  Diabetes - Sulfonylureas Failed - 11/30/2021  3:17 PM  ?  ?  Failed - HBA1C is between 0 and 7.9 and within 180 days  ?  HbA1c, POC (controlled diabetic range)  ?Date Value Ref Range Status  ?02/11/2020 7.8 (A) 0.0 - 7.0 % Final  ? ?HbA1c POC (<> result, manual entry)  ?Date Value Ref Range Status  ?10/20/2020 >15.0 4.0 - 5.6 % Final  ? ?Hgb A1c MFr Bld  ?Date Value Ref Range Status  ?09/03/2021 14.7 (H) 4.8 - 5.6 % Final  ?  Comment:  ?  (NOTE) ?Pre diabetes:          5.7%-6.4% ? ?Diabetes:              >6.4% ? ?Glycemic control for   <7.0% ?adults with diabetes ?  ?  ?  ?  ?  Failed - Cr in normal range and within 360 days  ?  Creat  ?Date Value Ref Range Status  ?04/04/2015 0.73 0.50 - 1.10 mg/dL Final  ? ?Creatinine, Ser  ?Date Value Ref Range Status  ?11/09/2021 1.17 (H) 0.57 - 1.00 mg/dL Final  ? ?Creatinine, Urine  ?Date Value Ref Range Status  ?05/07/2020 278.95 mg/dL Final  ?  ?  ?  ?  Passed - Valid encounter within last 6 months  ?  Recent Outpatient Visits   ? ?      ? 1 month ago Vaginal odor  ? [facility] Kerin Perna, NP  ? 1 month ago Hospital discharge follow-up  ? [facility] Kerin Perna, NP  ? ?  ?  ?Future Appointments   ? ?        ? In 3 days Kerin Perna, NP [place]  ? In 2 months [name], Helane Gunther, NP [facility]  ? ?  ? ?  ?  ?  ? insulin isophane & regular human KwikPen (HUMULIN 70/30 MIX) (70-30) 100 UNIT/ML  KwikPen 21 mL 0  ?  Sig: Inject 10 units every morning and evening.  ?  ? Endocrinology:  Diabetes - Insulins Failed - 11/30/2021  3:17 PM  ?  ?  Failed - HBA1C is between 0 and 7.9 and within 180 days  ?  HbA1c, POC (controlled diabetic range)  ?Date Value Ref Range Status  ?02/11/2020 7.8 (A) 0.0 - 7.0 % Final  ? ?HbA1c POC (<> result, manual entry)  ?Date Value Ref Range Status  ?10/20/2020 >15.0 4.0 - 5.6 % Final  ? ?Hgb A1c MFr Bld  ?Date Value Ref Range Status  ?09/03/2021 14.7 (H) 4.8 - 5.6 % Final  ?  Comment:  ?  (NOTE) ?Pre diabetes:          5.7%-6.4% ? ?Diabetes:              >6.4% ? ?Glycemic control for   <7.0% ?adults with diabetes ?  ?  ?  ?  ?  Passed - Valid encounter within last 6 months  ?  Recent Outpatient Visits   ? ?      ? 1 month ago Vaginal odor  ? [facility] Kerin Perna, NP  ? 1 month ago Hospital discharge follow-up  ? [facility] Kerin Perna, NP  ? ?  ?  ?Future Appointments   ? ?        ? In 3 days Kerin Perna, NP [place]  ? In 2 months [name], Helane Gunther, NP [facility]  ? ?  ? ?  ?  ?  ? Semaglutide,0.25 or 0.'5MG'$ /DOS, (OZEMPIC, 0.25 OR 0.5 MG/DOSE,) 2 MG/3ML SOPN 3 mL 0  ?  Sig: Inject 0.'25mg'$  into the skin for 4 weeks, then increase to 0.'5mg'$  once a week for 4 weeks, then '1mg'$  every week  ?  ? There is no refill protocol information for this order  ?  ? Insulin Pen Needle 32G X 4 MM MISC 100 each 0  ?  Sig: Use to inject insulin up to 4 times daily as needed.  ?  ? Endocrinology: Diabetes - Testing Supplies Passed - 11/30/2021  3:17 PM  ?  ?  Passed - Valid encounter within last 12 months  ?  Recent Outpatient Visits   ? ?      ? 1  month ago Vaginal odor  ? [facility] Kerin Perna, NP  ? 1 month ago Hospital discharge follow-up  ? [facility] Kerin Perna, NP  ? ?  ?  ?Future Appointments   ? ?        ? In 3 days  Kerin Perna, NP [place]  ? In 2 months [name], Helane Gunther, NP [facility]  ? ?  ? ?  ?  ?  ? ? ? ?

## 2021-12-01 NOTE — Telephone Encounter (Signed)
Routed to PCP 

## 2021-12-01 NOTE — Telephone Encounter (Signed)
Spoke with pt. Pt was notified of results. Pt will f/u as directed per Diona Browner, NP.  ?

## 2021-12-03 MED ORDER — INSULIN ISOPHANE & REGULAR (HUMAN 70-30)100 UNIT/ML KWIKPEN: PEN_INJECTOR | SUBCUTANEOUS | 0 refills | Status: DC

## 2021-12-03 MED ORDER — INSULIN PEN NEEDLE 32G X 4 MM MISC: 0 refills | Status: DC

## 2021-12-03 MED ORDER — OZEMPIC (0.25 OR 0.5 MG/DOSE) 2 MG/3ML ~~LOC~~ SOPN: PEN_INJECTOR | SUBCUTANEOUS | 0 refills | Status: DC

## 2021-12-03 MED ORDER — GLIPIZIDE ER 5 MG PO TB24: 5.0000 mg | ORAL_TABLET | Freq: Every day | ORAL | 2 refills | Status: DC

## 2021-12-04 ENCOUNTER — Encounter (INDEPENDENT_AMBULATORY_CARE_PROVIDER_SITE_OTHER): Payer: Self-pay

## 2021-12-04 ENCOUNTER — Ambulatory Visit (INDEPENDENT_AMBULATORY_CARE_PROVIDER_SITE_OTHER): Payer: Medicaid Other | Admitting: Primary Care

## 2022-01-01 ENCOUNTER — Other Ambulatory Visit (INDEPENDENT_AMBULATORY_CARE_PROVIDER_SITE_OTHER): Payer: Self-pay | Admitting: Student

## 2022-01-03 ENCOUNTER — Other Ambulatory Visit (INDEPENDENT_AMBULATORY_CARE_PROVIDER_SITE_OTHER): Payer: Self-pay | Admitting: Student

## 2022-01-04 ENCOUNTER — Encounter: Payer: Self-pay | Admitting: Neurology

## 2022-01-04 ENCOUNTER — Ambulatory Visit: Payer: Medicaid Other | Admitting: Neurology

## 2022-01-04 VITALS — BP 204/115 | HR 84 | Ht 62.0 in | Wt 264.0 lb

## 2022-01-04 DIAGNOSIS — Z9189 Other specified personal risk factors, not elsewhere classified: Secondary | ICD-10-CM

## 2022-01-04 DIAGNOSIS — I639 Cerebral infarction, unspecified: Secondary | ICD-10-CM | POA: Diagnosis not present

## 2022-01-04 DIAGNOSIS — I6602 Occlusion and stenosis of left middle cerebral artery: Secondary | ICD-10-CM | POA: Diagnosis not present

## 2022-01-04 DIAGNOSIS — R7989 Other specified abnormal findings of blood chemistry: Secondary | ICD-10-CM | POA: Diagnosis not present

## 2022-01-04 DIAGNOSIS — E7800 Pure hypercholesterolemia, unspecified: Secondary | ICD-10-CM | POA: Diagnosis not present

## 2022-01-04 DIAGNOSIS — R4701 Aphasia: Secondary | ICD-10-CM | POA: Diagnosis not present

## 2022-01-04 DIAGNOSIS — R7309 Other abnormal glucose: Secondary | ICD-10-CM | POA: Diagnosis not present

## 2022-01-04 DIAGNOSIS — R799 Abnormal finding of blood chemistry, unspecified: Secondary | ICD-10-CM | POA: Diagnosis not present

## 2022-01-04 MED ORDER — EZETIMIBE 10 MG PO TABS: 10.0000 mg | ORAL_TABLET | Freq: Every day | ORAL | 5 refills | Status: DC

## 2022-01-04 NOTE — Patient Instructions (Signed)
I had a long d/w patient about her recent cryptogenic embolic stroke, risk for recurrent stroke/TIAs, personally independently reviewed imaging studies and stroke evaluation results and answered questions.Continue Plavix 75 mg daily for secondary stroke prevention and maintain strict control of hypertension with blood pressure goal below 130/90, diabetes with hemoglobin A1c goal below 6.5% and lipids with LDL cholesterol goal below 70 mg/dL. I also advised the patient to eat a healthy diet with plenty of whole grains, cereals, fruits and vegetables, exercise regularly and maintain ideal body weight.  Check polysomnogram for sleep apnea.  Repeat lipid profile, hemoglobin A1c and anticardiolipin antibodies.  Add Zetia 10 mg daily as her lipids are still suboptimally controlled.  Followup in the future with my nurse practitioner in 6 months or call earlier if necessary. ?Stroke Prevention ?Some medical conditions and behaviors can lead to a higher chance of having a stroke. You can help prevent a stroke by eating healthy, exercising, not smoking, and managing any medical conditions you have. ?Stroke is a leading cause of functional impairment. Primary prevention is particularly important because a majority of strokes are first-time events. Stroke changes the lives of not only those who experience a stroke but also their family and other caregivers. ?How can this condition affect me? ?A stroke is a medical emergency and should be treated right away. A stroke can lead to brain damage and can sometimes be life-threatening. If a person gets medical treatment right away, there is a better chance of surviving and recovering from a stroke. ?What can increase my risk? ?The following medical conditions may increase your risk of a stroke: ?Cardiovascular disease. ?High blood pressure (hypertension). ?Diabetes. ?High cholesterol. ?Sickle cell disease. ?Blood clotting disorders (hypercoagulable state). ?Obesity. ?Sleep disorders  (obstructive sleep apnea). ?Other risk factors include: ?Being older than age 1. ?Having a history of blood clots, stroke, or mini-stroke (transient ischemic attack, TIA). ?Genetic factors, such as race, ethnicity, or a family history of stroke. ?Smoking cigarettes or using other tobacco products. ?Taking birth control pills, especially if you also use tobacco. ?Heavy use of alcohol or drugs, especially cocaine and methamphetamine. ?Physical inactivity. ?What actions can I take to prevent this? ?Manage your health conditions ?High cholesterol levels. ?Eating a healthy diet is important for preventing high cholesterol. If cholesterol cannot be managed through diet alone, you may need to take medicines. ?Take any prescribed medicines to control your cholesterol as told by your health care provider. ?Hypertension. ?To reduce your risk of stroke, try to keep your blood pressure below 130/80. ?Eating a healthy diet and exercising regularly are important for controlling blood pressure. If these steps are not enough to manage your blood pressure, you may need to take medicines. ?Take any prescribed medicines to control hypertension as told by your health care provider. ?Ask your health care provider if you should monitor your blood pressure at home. ?Have your blood pressure checked every year, even if your blood pressure is normal. Blood pressure increases with age and some medical conditions. ?Diabetes. ?Eating a healthy diet and exercising regularly are important parts of managing your blood sugar (glucose). If your blood sugar cannot be managed through diet and exercise, you may need to take medicines. ?Take any prescribed medicines to control your diabetes as told by your health care provider. ?Get evaluated for obstructive sleep apnea. Talk to your health care provider about getting a sleep evaluation if you snore a lot or have excessive sleepiness. ?Make sure that any other medical conditions you have, such as  atrial fibrillation or atherosclerosis, are managed. ?Nutrition ?Follow instructions from your health care provider about what to eat or drink to help manage your health condition. These instructions may include: ?Reducing your daily calorie intake. ?Limiting how much salt (sodium) you use to 1,500 milligrams (mg) each day. ?Using only healthy fats for cooking, such as olive oil, canola oil, or sunflower oil. ?Eating healthy foods. You can do this by: ?Choosing foods that are high in fiber, such as whole grains, and fresh fruits and vegetables. ?Eating at least 5 servings of fruits and vegetables a day. Try to fill one-half of your plate with fruits and vegetables at each meal. ?Choosing lean protein foods, such as lean cuts of meat, poultry without skin, fish, tofu, beans, and nuts. ?Eating low-fat dairy products. ?Avoiding foods that are high in sodium. This can help lower blood pressure. ?Avoiding foods that have saturated fat, trans fat, and cholesterol. This can help prevent high cholesterol. ?Avoiding processed and prepared foods. ?Counting your daily carbohydrate intake. ? ?Lifestyle ?If you drink alcohol: ?Limit how much you have to: ?0-1 drink a day for women who are not pregnant. ?0-2 drinks a day for men. ?Know how much alcohol is in your drink. In the [place] one drink equals one 12 oz bottle of beer (375m), one 5 oz glass of wine (1442m, or one 1? oz glass of hard liquor (4473m ?Do not use any products that contain nicotine or tobacco. These products include cigarettes, chewing tobacco, and vaping devices, such as e-cigarettes. If you need help quitting, ask your health care provider. ?Avoid secondhand smoke. ?Do not use drugs. ?Activity ? ?Try to stay at a healthy weight. ?Get at least 30 minutes of exercise on most days, such as: ?Fast walking. ?Biking. ?Swimming. ?Medicines ?Take over-the-counter and prescription medicines only as told by your health care provider. Aspirin or blood thinners  (antiplatelets or anticoagulants) may be recommended to reduce your risk of forming blood clots that can lead to stroke. ?Avoid taking birth control pills. Talk to your health care provider about the risks of taking birth control pills if: ?You are over 35 24ars old. ?You smoke. ?You get very bad headaches. ?You have had a blood clot. ?Where to find more information ?American Stroke Association: www.strokeassociation.org ?Get help right away if: ?You or a loved one has any symptoms of a stroke. "BE FAST" is an easy way to remember the main warning signs of a stroke: ?B - Balance. Signs are dizziness, sudden trouble walking, or loss of balance. ?E - Eyes. Signs are trouble seeing or a sudden change in vision. ?F - Face. Signs are sudden weakness or numbness of the face, or the face or eyelid drooping on one side. ?A - Arms. Signs are weakness or numbness in an arm. This happens suddenly and usually on one side of the body. ?S - Speech. Signs are sudden trouble speaking, slurred speech, or trouble understanding what people say. ?T - Time. Time to call emergency services. Write down what time symptoms started. ?You or a loved one has other signs of a stroke, such as: ?A sudden, severe headache with no known cause. ?Nausea or vomiting. ?Seizure. ?These symptoms may represent a serious problem that is an emergency. Do not wait to see if the symptoms will go away. Get medical help right away. Call your local emergency services (911 in the [place] Do not drive yourself to the hospital. ?Summary ?You can help to prevent a stroke by eating healthy, exercising, not smoking,  limiting alcohol intake, and managing any medical conditions you may have. ?Do not use any products that contain nicotine or tobacco. These include cigarettes, chewing tobacco, and vaping devices, such as e-cigarettes. If you need help quitting, ask your health care provider. ?Remember "BE FAST" for warning signs of a stroke. Get help right away if you or a  loved one has any of these signs. ?This information is not intended to replace advice given to you by your health care provider. Make sure you discuss any questions you have with your health care provider. ?[employer]
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
## 2022-01-04 NOTE — Telephone Encounter (Signed)
Sent to PCP ?

## 2022-01-05 ENCOUNTER — Encounter: Payer: Self-pay | Admitting: Neurology

## 2022-01-06 LAB — LIPID PANEL
Chol/HDL Ratio: 2.9 ratio (ref 0.0–4.4)
Cholesterol, Total: 124 mg/dL (ref 100–199)
HDL: 43 mg/dL (ref >39)
LDL Chol Calc (NIH): 63 mg/dL (ref 0–99)
Triglycerides: 93 mg/dL (ref 0–149)
VLDL Cholesterol Cal: 18 mg/dL (ref 5–40)

## 2022-01-06 LAB — CARDIOLIPIN ANTIBODIES, IGG, IGM, IGA
Anticardiolipin IgA: <9 APL U/mL (ref 0–11)
Anticardiolipin IgG: <9 GPL U/mL (ref 0–14)
Anticardiolipin IgM: <9 MPL U/mL (ref 0–12)

## 2022-01-06 LAB — HEMOGLOBIN A1C
Est. average glucose Bld gHb Est-mCnc: 154 mg/dL
Hgb A1c MFr Bld: 7 % — ABNORMAL HIGH (ref 4.8–5.6)

## 2022-01-07 ENCOUNTER — Other Ambulatory Visit (INDEPENDENT_AMBULATORY_CARE_PROVIDER_SITE_OTHER): Payer: Self-pay | Admitting: Student

## 2022-01-08 ENCOUNTER — Telehealth: Payer: Self-pay | Admitting: *Deleted

## 2022-01-08 ENCOUNTER — Encounter: Payer: Self-pay | Admitting: Neurology

## 2022-01-08 NOTE — Telephone Encounter (Addendum)
Note from Dr. Leonie Man: ? ? Your cholesterol profile is now quite satisfactory.  Good work.  Hemoglobin A1c is also significantly down to almost at goal.  Continue the good work.  Antiphospholipid antibody test result is not back yet  ? ?

## 2022-01-08 NOTE — Telephone Encounter (Signed)
I called patient. I discussed her lab results with her. We will let her know when the other labs are resulted. Pt verbalized understanding of results. Pt had no questions at this time but was encouraged to call back if questions arise. ? ?

## 2022-01-11 ENCOUNTER — Other Ambulatory Visit: Payer: Self-pay | Admitting: Pharmacist

## 2022-01-11 MED ORDER — OZEMPIC (0.25 OR 0.5 MG/DOSE) 2 MG/3ML ~~LOC~~ SOPN: 0.5000 mg | PEN_INJECTOR | SUBCUTANEOUS | 1 refills | Status: DC

## 2022-01-24 ENCOUNTER — Encounter: Payer: Self-pay | Admitting: Emergency Medicine

## 2022-01-24 ENCOUNTER — Ambulatory Visit (INDEPENDENT_AMBULATORY_CARE_PROVIDER_SITE_OTHER): Payer: Medicaid Other | Admitting: Emergency Medicine

## 2022-01-24 VITALS — BP 140/86 | HR 94 | Temp 97.7°F | Ht 62.0 in | Wt 266.0 lb

## 2022-01-24 DIAGNOSIS — E785 Hyperlipidemia, unspecified: Secondary | ICD-10-CM | POA: Diagnosis not present

## 2022-01-24 DIAGNOSIS — Z8673 Personal history of transient ischemic attack (TIA), and cerebral infarction without residual deficits: Secondary | ICD-10-CM | POA: Diagnosis not present

## 2022-01-24 DIAGNOSIS — E1169 Type 2 diabetes mellitus with other specified complication: Secondary | ICD-10-CM

## 2022-01-24 DIAGNOSIS — Z7689 Persons encountering health services in other specified circumstances: Secondary | ICD-10-CM

## 2022-01-24 DIAGNOSIS — I214 Non-ST elevation (NSTEMI) myocardial infarction: Secondary | ICD-10-CM

## 2022-01-24 DIAGNOSIS — I429 Cardiomyopathy, unspecified: Secondary | ICD-10-CM | POA: Diagnosis not present

## 2022-01-24 DIAGNOSIS — I5042 Chronic combined systolic (congestive) and diastolic (congestive) heart failure: Secondary | ICD-10-CM | POA: Diagnosis not present

## 2022-01-24 MED ORDER — FUROSEMIDE 40 MG PO TABS: 40.0000 mg | ORAL_TABLET | Freq: Every day | ORAL | 3 refills | Status: DC

## 2022-01-24 NOTE — Assessment & Plan Note (Signed)
Lab Results  Component Value Date   HGBA1C 7.0 (H) 01/04/2022  Well-controlled diabetes.  On multiple medications.  No hypoglycemic events reported. Continue Ozempic 0.5 mg weekly, Humulin insulin 70/30 10 units twice a day, Jardiance 10 mg daily, and glipizide 5 mg daily. Diet and nutrition discussed. Follow-up in 3 months.

## 2022-01-24 NOTE — Patient Instructions (Signed)
Hypertension, Adult High blood pressure (hypertension) is when the force of blood pumping through the arteries is too strong. The arteries are the blood vessels that carry blood from the heart throughout the body. Hypertension forces the heart to work harder to pump blood and may cause arteries to become narrow or stiff. Untreated or uncontrolled hypertension can lead to a heart attack, heart failure, a stroke, kidney disease, and other problems. A blood pressure reading consists of a higher number over a lower number. Ideally, your blood pressure should be below 120/80. The first ("top") number is called the systolic pressure. It is a measure of the pressure in your arteries as your heart beats. The second ("bottom") number is called the diastolic pressure. It is a measure of the pressure in your arteries as the heart relaxes. What are the causes? The exact cause of this condition is not known. There are some conditions that result in high blood pressure. What increases the risk? Certain factors may make you more likely to develop high blood pressure. Some of these risk factors are under your control, including: Smoking. Not getting enough exercise or physical activity. Being overweight. Having too much fat, sugar, calories, or salt (sodium) in your diet. Drinking too much alcohol. Other risk factors include: Having a personal history of heart disease, diabetes, high cholesterol, or kidney disease. Stress. Having a family history of high blood pressure and high cholesterol. Having obstructive sleep apnea. Age. The risk increases with age. What are the signs or symptoms? High blood pressure may not cause symptoms. Very high blood pressure (hypertensive crisis) may cause: Headache. Fast or irregular heartbeats (palpitations). Shortness of breath. Nosebleed. Nausea and vomiting. Vision changes. Severe chest pain, dizziness, and seizures. How is this diagnosed? This condition is diagnosed by  measuring your blood pressure while you are seated, with your arm resting on a flat surface, your legs uncrossed, and your feet flat on the floor. The cuff of the blood pressure monitor will be placed directly against the skin of your upper arm at the level of your heart. Blood pressure should be measured at least twice using the same arm. Certain conditions can cause a difference in blood pressure between your right and left arms. If you have a high blood pressure reading during one visit or you have normal blood pressure with other risk factors, you may be asked to: Return on a different day to have your blood pressure checked again. Monitor your blood pressure at home for 1 week or longer. If you are diagnosed with hypertension, you may have other blood or imaging tests to help your health care provider understand your overall risk for other conditions. How is this treated? This condition is treated by making healthy lifestyle changes, such as eating healthy foods, exercising more, and reducing your alcohol intake. You may be referred for counseling on a healthy diet and physical activity. Your health care provider may prescribe medicine if lifestyle changes are not enough to get your blood pressure under control and if: Your systolic blood pressure is above 130. Your diastolic blood pressure is above 80. Your personal target blood pressure may vary depending on your medical conditions, your age, and other factors. Follow these instructions at home: Eating and drinking  Eat a diet that is high in fiber and potassium, and low in sodium, added sugar, and fat. An example of this eating plan is called the DASH diet. DASH stands for Dietary Approaches to Stop Hypertension. To eat this way: Eat   plenty of fresh fruits and vegetables. Try to fill one half of your plate at each meal with fruits and vegetables. Eat whole grains, such as whole-wheat pasta, brown rice, or whole-grain bread. Fill about one  fourth of your plate with whole grains. Eat or drink low-fat dairy products, such as skim milk or low-fat yogurt. Avoid fatty cuts of meat, processed or cured meats, and poultry with skin. Fill about one fourth of your plate with lean proteins, such as fish, chicken without skin, beans, eggs, or tofu. Avoid pre-made and processed foods. These tend to be higher in sodium, added sugar, and fat. Reduce your daily sodium intake. Many people with hypertension should eat less than 1,500 mg of sodium a day. Do not drink alcohol if: Your health care provider tells you not to drink. You are pregnant, may be pregnant, or are planning to become pregnant. If you drink alcohol: Limit how much you have to: 0-1 drink a day for women. 0-2 drinks a day for men. Know how much alcohol is in your drink. In the [place] one drink equals one 12 oz bottle of beer (355 mL), one 5 oz glass of wine (148 mL), or one 1 oz glass of hard liquor (44 mL). Lifestyle  Work with your health care provider to maintain a healthy body weight or to lose weight. Ask what an ideal weight is for you. Get at least 30 minutes of exercise that causes your heart to beat faster (aerobic exercise) most days of the week. Activities may include walking, swimming, or biking. Include exercise to strengthen your muscles (resistance exercise), such as Pilates or lifting weights, as part of your weekly exercise routine. Try to do these types of exercises for 30 minutes at least 3 days a week. Do not use any products that contain nicotine or tobacco. These products include cigarettes, chewing tobacco, and vaping devices, such as e-cigarettes. If you need help quitting, ask your health care provider. Monitor your blood pressure at home as told by your health care provider. Keep all follow-up visits. This is important. Medicines Take over-the-counter and prescription medicines only as told by your health care provider. Follow directions carefully. Blood  pressure medicines must be taken as prescribed. Do not skip doses of blood pressure medicine. Doing this puts you at risk for problems and can make the medicine less effective. Ask your health care provider about side effects or reactions to medicines that you should watch for. Contact a health care provider if you: Think you are having a reaction to a medicine you are taking. Have headaches that keep coming back (recurring). Feel dizzy. Have swelling in your ankles. Have trouble with your vision. Get help right away if you: Develop a severe headache or confusion. Have unusual weakness or numbness. Feel faint. Have severe pain in your chest or abdomen. Vomit repeatedly. Have trouble breathing. These symptoms may be an emergency. Get help right away. Call 911. Do not wait to see if the symptoms will go away. Do not drive yourself to the hospital. Summary Hypertension is when the force of blood pumping through your arteries is too strong. If this condition is not controlled, it may put you at risk for serious complications. Your personal target blood pressure may vary depending on your medical conditions, your age, and other factors. For most people, a normal blood pressure is less than 120/80. Hypertension is treated with lifestyle changes, medicines, or a combination of both. Lifestyle changes include losing weight, eating a healthy,   low-sodium diet, exercising more, and limiting alcohol. This information is not intended to replace advice given to you by your health care provider. Make sure you discuss any questions you have with your health care provider. Document Revised: 06/27/2021 Document Reviewed: 06/27/2021 Elsevier Patient Education  2023 [employer]  
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
## 2022-01-24 NOTE — Assessment & Plan Note (Signed)
Clinical stable.  Continue cardiac medications as recommended by cardiologist.

## 2022-01-24 NOTE — Assessment & Plan Note (Signed)
Stable.  History of stent placement.  Continue daily Plavix 75 mg.

## 2022-01-24 NOTE — Assessment & Plan Note (Signed)
No signs of congestive heart failure at present time. Continue medications as recommended by cardiologist.

## 2022-02-02 ENCOUNTER — Other Ambulatory Visit (INDEPENDENT_AMBULATORY_CARE_PROVIDER_SITE_OTHER): Payer: Self-pay | Admitting: Student

## 2022-02-05 ENCOUNTER — Ambulatory Visit (INDEPENDENT_AMBULATORY_CARE_PROVIDER_SITE_OTHER): Payer: Medicaid Other | Admitting: Nurse Practitioner

## 2022-02-05 ENCOUNTER — Encounter: Payer: Self-pay | Admitting: Nurse Practitioner

## 2022-02-05 VITALS — BP 136/90 | HR 90 | Ht 62.0 in | Wt 272.8 lb

## 2022-02-05 DIAGNOSIS — Z8673 Personal history of transient ischemic attack (TIA), and cerebral infarction without residual deficits: Secondary | ICD-10-CM

## 2022-02-05 DIAGNOSIS — I10 Essential (primary) hypertension: Secondary | ICD-10-CM

## 2022-02-05 DIAGNOSIS — I5042 Chronic combined systolic (congestive) and diastolic (congestive) heart failure: Secondary | ICD-10-CM

## 2022-02-05 DIAGNOSIS — R197 Diarrhea, unspecified: Secondary | ICD-10-CM | POA: Diagnosis not present

## 2022-02-05 DIAGNOSIS — I2510 Atherosclerotic heart disease of native coronary artery without angina pectoris: Secondary | ICD-10-CM

## 2022-02-05 DIAGNOSIS — R42 Dizziness and giddiness: Secondary | ICD-10-CM

## 2022-02-05 DIAGNOSIS — E66813 Obesity, class 3: Secondary | ICD-10-CM

## 2022-02-05 DIAGNOSIS — E785 Hyperlipidemia, unspecified: Secondary | ICD-10-CM

## 2022-02-05 MED ORDER — ENTRESTO 24-26 MG PO TABS: 1.0000 | ORAL_TABLET | Freq: Two times a day (BID) | ORAL | 3 refills | Status: DC
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
## 2022-02-05 NOTE — Patient Instructions (Addendum)
Medication Instructions:  STOP Valsartan START Entresto 24/26 mg twice daily  *If you need a refill on your cardiac medications before your next appointment, please call your pharmacy*   Lab Work: Your physician recommends that you return for lab work in 2 weeks BMET  If you have labs (blood work) drawn today and your tests are completely normal, you will receive your results only by: [place] (if you have MyChart) OR A paper copy in the mail If you have any lab test that is abnormal or we need to change your treatment, we will call you to review the results.   Testing/Procedures: NONE ordered at this time of appointment     Follow-Up: At [facility], you and your health needs are our priority.  As part of our continuing mission to provide you with exceptional heart care, we have created designated Provider Care Teams.  These Care Teams include your primary Cardiologist (physician) and Advanced Practice Providers (APPs -  Physician Assistants and Nurse Practitioners) who all work together to provide you with the care you need, when you need it.  We recommend signing up for the patient portal called "MyChart".  Sign up information is provided on this After Visit Summary.  MyChart is used to connect with patients for Virtual Visits (Telemedicine).  Patients are able to view lab/test results, encounter notes, upcoming appointments, etc.  Non-urgent messages can be sent to your provider as well.   To learn more about what you can do with MyChart, go to [email or web address].    Your next appointment:   3 month(s)  The format for your next appointment:   In Person  Provider:   Diona Browner, NP        Other Instructions Monitor blood pressure. Report blood pressure consistently greater than 130/80.  Important Information About Sugar

## 2022-02-13 ENCOUNTER — Ambulatory Visit: Payer: Medicaid Other | Admitting: Emergency Medicine

## 2022-02-20 ENCOUNTER — Other Ambulatory Visit: Payer: Self-pay

## 2022-02-20 ENCOUNTER — Other Ambulatory Visit (INDEPENDENT_AMBULATORY_CARE_PROVIDER_SITE_OTHER): Payer: Self-pay | Admitting: Student

## 2022-02-21 ENCOUNTER — Other Ambulatory Visit: Payer: Self-pay

## 2022-02-22 ENCOUNTER — Telehealth: Payer: Self-pay | Admitting: Nurse Practitioner

## 2022-02-22 MED ORDER — ENTRESTO 24-26 MG PO TABS: 1.0000 | ORAL_TABLET | Freq: Two times a day (BID) | ORAL | 3 refills | Status: DC

## 2022-02-22 NOTE — Telephone Encounter (Addendum)
**Note De-Identified Lauren Delgado Obfuscation** Delene Loll PA started through covermymeds Key: [id number]  Per Darrin Nipper, NPs office visit notes from 02/05/2022: Medication Instructions:  STOP Valsartan START Entresto 24/26 mg twice daily  Entresto was added but Valsartan was not removed from the pts med list. I have removed it.  Also, I have e-scribed a Entresto 24-26 mg RX #60 with 3 refills to [id number] [place] [address] [id number] Phone:  [phone] Fax:  [phone] is the pts pharmacy of choice per her refill HX as I have not received a PA request for this pt.

## 2022-02-22 NOTE — Telephone Encounter (Signed)
Samples placed up front for pick up Pt made aware  Entresto 24-26 mg Qty: 1 bottle Lot # [id number] Exp: 8/25

## 2022-02-22 NOTE — Telephone Encounter (Signed)
Pt c/o medication issue:  1. Name of Medication: sacubitril-valsartan (ENTRESTO) 24-26 MG  2. How are you currently taking this medication (dosage and times per day)? As directed  3. Are you having a reaction (difficulty breathing--STAT)? no  4. What is your medication issue? Patient is waiting on a Prior Auth for this medication   Patient is out of medication

## 2022-02-23 DIAGNOSIS — I2510 Atherosclerotic heart disease of native coronary artery without angina pectoris: Secondary | ICD-10-CM | POA: Diagnosis not present

## 2022-02-23 DIAGNOSIS — Z8673 Personal history of transient ischemic attack (TIA), and cerebral infarction without residual deficits: Secondary | ICD-10-CM | POA: Diagnosis not present

## 2022-02-23 DIAGNOSIS — E785 Hyperlipidemia, unspecified: Secondary | ICD-10-CM | POA: Diagnosis not present

## 2022-02-23 DIAGNOSIS — I10 Essential (primary) hypertension: Secondary | ICD-10-CM | POA: Diagnosis not present

## 2022-02-23 DIAGNOSIS — I5042 Chronic combined systolic (congestive) and diastolic (congestive) heart failure: Secondary | ICD-10-CM | POA: Diagnosis not present

## 2022-02-24 LAB — BASIC METABOLIC PANEL
BUN/Creatinine Ratio: 21 (ref 9–23)
BUN: 21 mg/dL — ABNORMAL HIGH (ref 6–20)
CO2: 22 mmol/L (ref 20–29)
Calcium: 9.1 mg/dL (ref 8.7–10.2)
Chloride: 102 mmol/L (ref 96–106)
Creatinine, Ser: 0.99 mg/dL (ref 0.57–1.00)
Glucose: 290 mg/dL — ABNORMAL HIGH (ref 70–99)
Potassium: 3.9 mmol/L (ref 3.5–5.2)
Sodium: 141 mmol/L (ref 134–144)
eGFR: 77 mL/min/{1.73_m2} (ref >59)

## 2022-02-26 ENCOUNTER — Telehealth: Payer: Self-pay

## 2022-03-02 ENCOUNTER — Emergency Department (HOSPITAL_BASED_OUTPATIENT_CLINIC_OR_DEPARTMENT_OTHER): Payer: Medicaid Other

## 2022-03-02 ENCOUNTER — Observation Stay (HOSPITAL_BASED_OUTPATIENT_CLINIC_OR_DEPARTMENT_OTHER)
Admission: EM | Admit: 2022-03-02 | Discharge: 2022-03-03 | Disposition: A | Discharge status: Home / Self Care | Payer: Medicaid Other | Attending: Internal Medicine | Admitting: Internal Medicine

## 2022-03-02 ENCOUNTER — Other Ambulatory Visit: Payer: Self-pay

## 2022-03-02 ENCOUNTER — Encounter (HOSPITAL_BASED_OUTPATIENT_CLINIC_OR_DEPARTMENT_OTHER): Payer: Self-pay | Admitting: Emergency Medicine

## 2022-03-02 DIAGNOSIS — R7989 Other specified abnormal findings of blood chemistry: Secondary | ICD-10-CM

## 2022-03-02 DIAGNOSIS — E1169 Type 2 diabetes mellitus with other specified complication: Secondary | ICD-10-CM | POA: Diagnosis present

## 2022-03-02 DIAGNOSIS — E119 Type 2 diabetes mellitus without complications: Secondary | ICD-10-CM

## 2022-03-02 DIAGNOSIS — Z794 Long term (current) use of insulin: Secondary | ICD-10-CM | POA: Diagnosis not present

## 2022-03-02 DIAGNOSIS — R0789 Other chest pain: Secondary | ICD-10-CM | POA: Diagnosis not present

## 2022-03-02 DIAGNOSIS — I5043 Acute on chronic combined systolic (congestive) and diastolic (congestive) heart failure: Secondary | ICD-10-CM | POA: Diagnosis not present

## 2022-03-02 DIAGNOSIS — Z7902 Long term (current) use of antithrombotics/antiplatelets: Secondary | ICD-10-CM | POA: Insufficient documentation

## 2022-03-02 DIAGNOSIS — Z79899 Other long term (current) drug therapy: Secondary | ICD-10-CM | POA: Diagnosis not present

## 2022-03-02 DIAGNOSIS — Z9189 Other specified personal risk factors, not elsewhere classified: Secondary | ICD-10-CM

## 2022-03-02 DIAGNOSIS — I2510 Atherosclerotic heart disease of native coronary artery without angina pectoris: Secondary | ICD-10-CM

## 2022-03-02 DIAGNOSIS — I110 Hypertensive heart disease with heart failure: Secondary | ICD-10-CM | POA: Insufficient documentation

## 2022-03-02 DIAGNOSIS — E039 Hypothyroidism, unspecified: Secondary | ICD-10-CM | POA: Insufficient documentation

## 2022-03-02 DIAGNOSIS — E785 Hyperlipidemia, unspecified: Secondary | ICD-10-CM | POA: Insufficient documentation

## 2022-03-02 DIAGNOSIS — I160 Hypertensive urgency: Secondary | ICD-10-CM | POA: Diagnosis not present

## 2022-03-02 DIAGNOSIS — Z7984 Long term (current) use of oral hypoglycemic drugs: Secondary | ICD-10-CM | POA: Diagnosis not present

## 2022-03-02 DIAGNOSIS — Z8673 Personal history of transient ischemic attack (TIA), and cerebral infarction without residual deficits: Secondary | ICD-10-CM | POA: Diagnosis not present

## 2022-03-02 DIAGNOSIS — E1159 Type 2 diabetes mellitus with other circulatory complications: Secondary | ICD-10-CM | POA: Diagnosis present

## 2022-03-02 DIAGNOSIS — Z7985 Long-term (current) use of injectable non-insulin antidiabetic drugs: Secondary | ICD-10-CM | POA: Diagnosis not present

## 2022-03-02 DIAGNOSIS — Z955 Presence of coronary angioplasty implant and graft: Secondary | ICD-10-CM | POA: Diagnosis not present

## 2022-03-02 DIAGNOSIS — R079 Chest pain, unspecified: Secondary | ICD-10-CM | POA: Diagnosis present

## 2022-03-02 DIAGNOSIS — I25118 Atherosclerotic heart disease of native coronary artery with other forms of angina pectoris: Secondary | ICD-10-CM

## 2022-03-02 DIAGNOSIS — I10 Essential (primary) hypertension: Secondary | ICD-10-CM | POA: Diagnosis present

## 2022-03-02 HISTORY — DX: Acute myocardial infarction, unspecified: I21.9

## 2022-03-02 LAB — CBC WITH DIFFERENTIAL/PLATELET
Abs Immature Granulocytes: 0.03 10*3/uL (ref 0.00–0.07)
Basophils Absolute: 0 10*3/uL (ref 0.0–0.1)
Basophils Relative: 0 %
Eosinophils Absolute: 0.1 10*3/uL (ref 0.0–0.5)
Eosinophils Relative: 1 %
HCT: 34.2 % — ABNORMAL LOW (ref 36.0–46.0)
Hemoglobin: 11.2 g/dL — ABNORMAL LOW (ref 12.0–15.0)
Immature Granulocytes: 0 %
Lymphocytes Relative: 20 %
Lymphs Abs: 2.1 10*3/uL (ref 0.7–4.0)
MCH: 29.7 pg (ref 26.0–34.0)
MCHC: 32.7 g/dL (ref 30.0–36.0)
MCV: 90.7 fL (ref 80.0–100.0)
Monocytes Absolute: 0.5 10*3/uL (ref 0.1–1.0)
Monocytes Relative: 5 %
Neutro Abs: 7.7 10*3/uL (ref 1.7–7.7)
Neutrophils Relative %: 74 %
Platelets: 291 10*3/uL (ref 150–400)
RBC: 3.77 MIL/uL — ABNORMAL LOW (ref 3.87–5.11)
RDW: 13.3 % (ref 11.5–15.5)
WBC: 10.5 10*3/uL (ref 4.0–10.5)
nRBC: 0 % (ref 0.0–0.2)

## 2022-03-02 LAB — GLUCOSE, CAPILLARY
Glucose-Capillary: 189 mg/dL — ABNORMAL HIGH (ref 70–99)
Glucose-Capillary: 228 mg/dL — ABNORMAL HIGH (ref 70–99)

## 2022-03-02 LAB — TROPONIN I (HIGH SENSITIVITY)
Troponin I (High Sensitivity): 6 ng/L (ref <18)
Troponin I (High Sensitivity): 7 ng/L (ref <18)

## 2022-03-02 LAB — BASIC METABOLIC PANEL
Anion gap: 6 (ref 5–15)
BUN: 22 mg/dL — ABNORMAL HIGH (ref 6–20)
CO2: 26 mmol/L (ref 22–32)
Calcium: 9.1 mg/dL (ref 8.9–10.3)
Chloride: 104 mmol/L (ref 98–111)
Creatinine, Ser: 1.06 mg/dL — ABNORMAL HIGH (ref 0.44–1.00)
GFR, Estimated: >60 mL/min (ref >60)
Glucose, Bld: 222 mg/dL — ABNORMAL HIGH (ref 70–99)
Potassium: 3.5 mmol/L (ref 3.5–5.1)
Sodium: 136 mmol/L (ref 135–145)

## 2022-03-02 LAB — MAGNESIUM: Magnesium: 1.9 mg/dL (ref 1.7–2.4)

## 2022-03-02 LAB — CBG MONITORING, ED
Glucose-Capillary: 199 mg/dL — ABNORMAL HIGH (ref 70–99)
Glucose-Capillary: 258 mg/dL — ABNORMAL HIGH (ref 70–99)

## 2022-03-02 LAB — HCG, QUANTITATIVE, PREGNANCY: hCG, Beta Chain, Quant, S: <1 m[IU]/mL (ref <5)

## 2022-03-02 LAB — BRAIN NATRIURETIC PEPTIDE: B Natriuretic Peptide: 240.2 pg/mL — ABNORMAL HIGH (ref 0.0–100.0)

## 2022-03-02 MED ORDER — NITROGLYCERIN 0.4 MG SL SUBL
0.4000 mg | SUBLINGUAL_TABLET | SUBLINGUAL | Status: DC | PRN
  Administered 2022-03-02 (×3): 0.4 mg via SUBLINGUAL
  Filled 2022-03-02: qty 1

## 2022-03-02 MED ORDER — METOPROLOL TARTRATE 5 MG/5ML IV SOLN
5.0000 mg | INTRAVENOUS | Status: DC | PRN
  Filled 2022-03-02: qty 5

## 2022-03-02 MED ORDER — FUROSEMIDE 10 MG/ML IJ SOLN
40.0000 mg | Freq: Every day | INTRAMUSCULAR | Status: DC
  Administered 2022-03-03: 40 mg via INTRAVENOUS
  Filled 2022-03-02: qty 4

## 2022-03-02 MED ORDER — SODIUM CHLORIDE 0.9% FLUSH
3.0000 mL | Freq: Two times a day (BID) | INTRAVENOUS | Status: DC
  Administered 2022-03-02 – 2022-03-03 (×2): 3 mL via INTRAVENOUS

## 2022-03-02 MED ORDER — ACETAMINOPHEN 650 MG RE SUPP: 650.0000 mg | Freq: Four times a day (QID) | RECTAL | Status: DC | PRN

## 2022-03-02 MED ORDER — ENOXAPARIN SODIUM 60 MG/0.6ML IJ SOSY
60.0000 mg | PREFILLED_SYRINGE | INTRAMUSCULAR | Status: DC
  Administered 2022-03-02: 60 mg via SUBCUTANEOUS
  Filled 2022-03-02: qty 0.6

## 2022-03-02 MED ORDER — ACETAMINOPHEN 325 MG PO TABS: 650.0000 mg | ORAL_TABLET | Freq: Four times a day (QID) | ORAL | Status: DC | PRN

## 2022-03-02 MED ORDER — SODIUM CHLORIDE 0.9 % IV SOLN: 250.0000 mL | INTRAVENOUS | Status: DC | PRN

## 2022-03-02 MED ORDER — ASPIRIN 81 MG PO CHEW
324.0000 mg | CHEWABLE_TABLET | Freq: Once | ORAL | Status: AC
Start: 2022-03-02 — End: 2022-03-02
  Administered 2022-03-02: 324 mg via ORAL
  Filled 2022-03-02: qty 4

## 2022-03-02 MED ORDER — CLOPIDOGREL BISULFATE 75 MG PO TABS
75.0000 mg | ORAL_TABLET | Freq: Every morning | ORAL | Status: DC
  Administered 2022-03-03: 75 mg via ORAL
  Filled 2022-03-02: qty 1

## 2022-03-02 MED ORDER — INSULIN ASPART 100 UNIT/ML IJ SOLN
0.0000 [IU] | Freq: Three times a day (TID) | INTRAMUSCULAR | Status: DC
  Administered 2022-03-02: 3 [IU] via SUBCUTANEOUS
  Administered 2022-03-03: 8 [IU] via SUBCUTANEOUS
  Administered 2022-03-03: 3 [IU] via SUBCUTANEOUS

## 2022-03-02 MED ORDER — SODIUM CHLORIDE 0.9% FLUSH: 3.0000 mL | INTRAVENOUS | Status: DC | PRN

## 2022-03-02 MED ORDER — ENOXAPARIN SODIUM 40 MG/0.4ML IJ SOSY: 40.0000 mg | PREFILLED_SYRINGE | INTRAMUSCULAR | Status: DC

## 2022-03-02 MED ORDER — EMPAGLIFLOZIN 10 MG PO TABS
10.0000 mg | ORAL_TABLET | Freq: Every day | ORAL | Status: DC
  Administered 2022-03-03: 10 mg via ORAL
  Filled 2022-03-02: qty 1

## 2022-03-02 MED ORDER — CARVEDILOL 12.5 MG PO TABS
12.5000 mg | ORAL_TABLET | Freq: Two times a day (BID) | ORAL | Status: DC
  Administered 2022-03-02: 12.5 mg via ORAL
  Filled 2022-03-02: qty 1

## 2022-03-02 MED ORDER — HYDRALAZINE HCL 20 MG/ML IJ SOLN: 10.0000 mg | Freq: Three times a day (TID) | INTRAMUSCULAR | Status: DC | PRN

## 2022-03-02 MED ORDER — FUROSEMIDE 10 MG/ML IJ SOLN
40.0000 mg | Freq: Once | INTRAMUSCULAR | Status: AC
Start: 2022-03-02 — End: 2022-03-02
  Administered 2022-03-02: 40 mg via INTRAVENOUS
  Filled 2022-03-02: qty 4

## 2022-03-02 MED ORDER — ATORVASTATIN CALCIUM 80 MG PO TABS
80.0000 mg | ORAL_TABLET | Freq: Every day | ORAL | Status: DC
  Administered 2022-03-02 – 2022-03-03 (×2): 80 mg via ORAL
  Filled 2022-03-02 (×2): qty 1

## 2022-03-02 MED ORDER — ISOSORBIDE MONONITRATE ER 30 MG PO TB24
30.0000 mg | ORAL_TABLET | Freq: Every day | ORAL | Status: DC
  Administered 2022-03-02 – 2022-03-03 (×2): 30 mg via ORAL
  Filled 2022-03-02 (×2): qty 1

## 2022-03-02 MED ORDER — SPIRONOLACTONE 25 MG PO TABS
25.0000 mg | ORAL_TABLET | ORAL | Status: DC
  Administered 2022-03-02: 25 mg via ORAL
  Filled 2022-03-02 (×2): qty 1

## 2022-03-02 MED ORDER — SACUBITRIL-VALSARTAN 24-26 MG PO TABS
1.0000 | ORAL_TABLET | Freq: Two times a day (BID) | ORAL | Status: DC
  Administered 2022-03-02 – 2022-03-03 (×2): 1 via ORAL
  Filled 2022-03-02 (×2): qty 1

## 2022-03-02 NOTE — ED Notes (Signed)
Report given to [facility] 

## 2022-03-02 NOTE — Assessment & Plan Note (Addendum)
CAD s/p NSTEMI, DESx 4 (p-mLAD, Ramus, pRCA) in 2019. -Myoview in January 2021showedEF 36%, intermediate risk study with apical scar and moderate anterior apical ischemia with this scarring of anterior septum -troponin wnl x 2 -ekg with no acute changes  -she is chest pain free at this time, not indicative of ACS  -continue plavix, lipitor, coreg
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
## 2022-03-02 NOTE — Assessment & Plan Note (Signed)
Recent lipid panel with LDL <70 Continue high intensity statin: lipitor '80mg'$  daily

## 2022-03-02 NOTE — ED Notes (Signed)
CBG results 258 reported to Margurite Auerbach RN

## 2022-03-02 NOTE — Assessment & Plan Note (Addendum)
Recent A1C in 01/2022 was 7.0 Stopped taking ozempic due to SE Hold glipizide  Continue jardiance On 70/30 10 units BID-hold this  will do moderate SSI while inpatient  accuchecks ac/hs

## 2022-03-02 NOTE — ED Triage Notes (Signed)
POV, pt sts that midsternal chest pain that radiated to left arm began last night approx 1130pm. Pt sts that she felt SOB and used mothers O2 tank. Pt took two nitro at home, last dose approx 130 with some relief, chest pain from 10/10 to 8/10. Still feels SOB. Denies associated symptoms of N/V, diaphoresis, dizziness or pain with deep breathing. Alert and oriented x4, ambulatory to room.

## 2022-03-02 NOTE — Assessment & Plan Note (Addendum)
Elevated, but has not had her medication since 6/28 Continue coreg 12.'5mg'$  BID, entresto, aldactone every other day Hold norvasc for now  Hydralazine PRN for SBP >180

## 2022-03-02 NOTE — Assessment & Plan Note (Addendum)
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  Bubble study was negative for PFO. TEEwithoutcardiac source of embolism.Telemetrymonitoring showed no arrhythmia. -PCP recently ordered a  30 day event monitor -Hypercoagulable studies pending per neurology. -referral placed to neurology as she never followed up post hospitalization. -continue plavix and statin

## 2022-03-02 NOTE — Plan of Care (Signed)
Name: Lauren Delgado [facility]  1986-06-02 MR# [id number] Dr Florina Ou from [facility] ED  Patient is a 47 days old woman with history of hypertension, diabetes mellitus, hyperlipidemia, congestive heart failure with mildly reduced ejection fraction 45 to 50%, coronary artery disease status post prior stents who came with chest pain.  Work-up negative for acute coronary syndrome, with negative EKG, troponins x2.  Per patient nitroglycerin brought some relief, and currently patient is chest pain-free.  Vital signs stable, no hypoxia.  Chest x-ray showing possible pulmonary vascular congestion, fibrosis versus edema.  proBNP pending. ED physician did not think, that chest pain pattern was resembling pulmonary emboli hence he did not do work-up towards this etiology.  He wants to get her admitted under observation status, given her prior medical history and being high risk of complications.  Patient can be admitted to cardiac telemetry at [facility]  Dr [facility]

## 2022-03-02 NOTE — Assessment & Plan Note (Signed)
Was supposed to have an outpatient study done, but they have not called her F/u with PCP

## 2022-03-02 NOTE — Inpatient Diabetes Management (Signed)
Inpatient Diabetes Program Recommendations  AACE/ADA: New Consensus Statement on Inpatient Glycemic Control (2015)  Target Ranges:  Prepandial:   less than 140 mg/dL      Peak postprandial:   less than 180 mg/dL (1-2 hours)      Critically ill patients:  140 - 180 mg/dL   Lab Results  Component Value Date   GLUCAP 258 (H) 03/02/2022   HGBA1C 7.0 (H) 01/04/2022    Review of Glycemic Control  Latest Reference Range & Units 03/02/22 07:17 03/02/22 12:28  Glucose-Capillary 70 - 99 mg/dL 199 (H) 258 (H)  (H): Data is abnormally high  Diabetes history: DM2 Outpatient Diabetes medications: Jardiance 10 mg QD, Glipizide 5 mg with BF, 70/30 10 units BID, Ozempic 0.25 or 0.5 weekly Current orders for Inpatient glycemic control: No DM meds ordered  Inpatient Diabetes Program Recommendations:    Please consider:  Novolog 0-15 units TID and 0-5 units QHS Novolog 2 units TID with meals if consumes at least 50%   Will continue to follow while inpatient.  Thank you, Reche Dixon, MSN, [place] Diabetes Coordinator Inpatient Diabetes Program [phone] (team pager from 8a-5p)
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
## 2022-03-02 NOTE — Assessment & Plan Note (Addendum)
36 year old with history of CAD s/p PCI and combined CHF presenting with 2 day history of shortness of breath and chest pain with associated leg swelling, weight gain, cough and orthopnea found to be in acute on chronic combined CHF by clinical symptoms, elevated BNP and findings on CXR of possible recurrent edema.  -obs to telemetry -strict I/O -takes lasix '40mg'$  prn outpatient, but has been using more frequently recently and last took her '40mg'$  pill last night -will give '40mg'$  IV x 1 now and monitor response -strict I/O and daily weights, unsure about weight gain as weight has been trending upward since stopping the ozempic  -last echo in 09/2021 with EF of 45-50% with mildly decreased LVF and global hypokinesis. Grade 1 DD.   -repeat echo, has only been on entresto <1 month so cardiology may not feel this is necessary  -continue entresto, jardiance, coreg, aldactone.  -cardiology consulted
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
## 2022-03-03 ENCOUNTER — Observation Stay (HOSPITAL_BASED_OUTPATIENT_CLINIC_OR_DEPARTMENT_OTHER): Payer: Medicaid Other

## 2022-03-03 DIAGNOSIS — I2510 Atherosclerotic heart disease of native coronary artery without angina pectoris: Secondary | ICD-10-CM

## 2022-03-03 DIAGNOSIS — I5043 Acute on chronic combined systolic (congestive) and diastolic (congestive) heart failure: Secondary | ICD-10-CM

## 2022-03-03 DIAGNOSIS — I5020 Unspecified systolic (congestive) heart failure: Secondary | ICD-10-CM | POA: Diagnosis not present

## 2022-03-03 DIAGNOSIS — I10 Essential (primary) hypertension: Secondary | ICD-10-CM | POA: Diagnosis not present

## 2022-03-03 DIAGNOSIS — E119 Type 2 diabetes mellitus without complications: Secondary | ICD-10-CM

## 2022-03-03 DIAGNOSIS — Z9861 Coronary angioplasty status: Secondary | ICD-10-CM | POA: Diagnosis not present

## 2022-03-03 DIAGNOSIS — R7989 Other specified abnormal findings of blood chemistry: Secondary | ICD-10-CM

## 2022-03-03 DIAGNOSIS — Z8673 Personal history of transient ischemic attack (TIA), and cerebral infarction without residual deficits: Secondary | ICD-10-CM

## 2022-03-03 LAB — BASIC METABOLIC PANEL
Anion gap: 9 (ref 5–15)
BUN: 25 mg/dL — ABNORMAL HIGH (ref 6–20)
CO2: 27 mmol/L (ref 22–32)
Calcium: 8.7 mg/dL — ABNORMAL LOW (ref 8.9–10.3)
Chloride: 103 mmol/L (ref 98–111)
Creatinine, Ser: 1.02 mg/dL — ABNORMAL HIGH (ref 0.44–1.00)
GFR, Estimated: >60 mL/min (ref >60)
Glucose, Bld: 191 mg/dL — ABNORMAL HIGH (ref 70–99)
Potassium: 3.8 mmol/L (ref 3.5–5.1)
Sodium: 139 mmol/L (ref 135–145)

## 2022-03-03 LAB — GLUCOSE, CAPILLARY
Glucose-Capillary: 279 mg/dL — ABNORMAL HIGH (ref 70–99)
Glucose-Capillary: 177 mg/dL — ABNORMAL HIGH (ref 70–99)

## 2022-03-03 LAB — ECHOCARDIOGRAM COMPLETE
AR max vel: 2.43 cm2
AV Peak grad: 7 mmHg
Ao pk vel: 1.32 m/s
Area-P 1/2: 4.01 cm2
Calc EF: 48.3 %
Height: 62 in
S' Lateral: 4.2 cm
Single Plane A2C EF: 44.6 %
Single Plane A4C EF: 48.3 %
Weight: 4444.8 oz

## 2022-03-03 LAB — TSH: TSH: 4.679 u[IU]/mL — ABNORMAL HIGH (ref 0.350–4.500)

## 2022-03-03 MED ORDER — POTASSIUM CHLORIDE CRYS ER 20 MEQ PO TBCR
20.0000 meq | EXTENDED_RELEASE_TABLET | Freq: Every day | ORAL | Status: DC
Start: 2022-03-03 — End: 2022-03-03

## 2022-03-03 MED ORDER — CARVEDILOL 12.5 MG PO TABS: 25.0000 mg | ORAL_TABLET | Freq: Two times a day (BID) | ORAL | 0 refills | Status: DC

## 2022-03-03 MED ORDER — ISOSORBIDE MONONITRATE ER 30 MG PO TB24: 30.0000 mg | ORAL_TABLET | Freq: Every day | ORAL | 0 refills | Status: DC

## 2022-03-03 MED ORDER — CARVEDILOL 25 MG PO TABS
25.0000 mg | ORAL_TABLET | Freq: Two times a day (BID) | ORAL | Status: DC
  Administered 2022-03-03: 25 mg via ORAL
  Filled 2022-03-03: qty 1

## 2022-03-03 MED ORDER — FUROSEMIDE 40 MG PO TABS: 40.0000 mg | ORAL_TABLET | Freq: Every day | ORAL | 0 refills | Status: DC

## 2022-03-03 MED ORDER — AMLODIPINE BESYLATE 10 MG PO TABS: 5.0000 mg | ORAL_TABLET | Freq: Every day | ORAL | 3 refills | Status: DC

## 2022-03-03 NOTE — Progress Notes (Signed)
Discharged to home. D/c instructions and ff up appt discussed with pt. Verbalized understanding.

## 2022-03-03 NOTE — Assessment & Plan Note (Signed)
Mild elevation of TSH. Patient with no hypothyroid symptoms.   Plan to follow up thyroid function testing as outpatient, for now no further inpatient intervention.

## 2022-03-03 NOTE — Hospital Course (Signed)
Lauren Delgado was admitted to the hospital with the working diagnosis of decompensated heart failure.   36 yo female with the past medical history of heart failure, coronary artery disease, hypertension, dyslipidemia, I3JA, left embolic CVA, and obesity class 3 who presented with dyspnea and chest pain. Reported left precordial chest pain, associated with dyspnea. Only mild improvement with nitroglycerin, prompting her to come to the hospital. On her initial physical examination her blood pressure was 180/103, HR 91, RR 19 and 02 saturation 98% on room air. Lungs with no wheezing or rhonchi, heart with S1 and S2 present and rhythmic, abdomen not distended, trace lower extremity edema.   Na 136. K 3.5 Cl 104, bicarbonate 26 glucose 222 bun 22 cr 1,0  BNP 240  High sensitive troponin 6 and 7  Wbc 10,5 hgb 11,2 plt 291   Chest radiograph with mild cardiomegaly, with bilateral reticular infiltrates.   EKG 94 bpm, normal axis, normal intervals, sinus rhythm with poor R wave progression, q wave V1 and V2, no significant ST segment or T wave changes.   Patient was placed on furosemide for diuresis.

## 2022-03-03 NOTE — Progress Notes (Signed)
Progress Note  Patient Name: Lauren Delgado Date of Encounter: 03/03/2022  Primary Cardiologist: Pixie Casino, MD  Subjective   Breathing at baseline and leg edema better, she is wearing compression stockings this morning.  No orthopnea or PND.  Inpatient Medications    Scheduled Meds:  atorvastatin  80 mg Oral Daily   carvedilol  12.5 mg Oral BID   clopidogrel  75 mg Oral q AM   empagliflozin  10 mg Oral QAC breakfast   enoxaparin (LOVENOX) injection  60 mg Subcutaneous Q24H   furosemide  40 mg Intravenous Daily   insulin aspart  0-15 Units Subcutaneous TID WC   isosorbide mononitrate  30 mg Oral Daily   sacubitril-valsartan  1 tablet Oral BID   sodium chloride flush  3 mL Intravenous Q12H   spironolactone  25 mg Oral QODAY   Continuous Infusions:  sodium chloride     PRN Meds: sodium chloride, acetaminophen **OR** acetaminophen, hydrALAZINE, nitroGLYCERIN, sodium chloride flush   Vital Signs    Vitals:   03/02/22 1620 03/02/22 1927 03/02/22 2355 03/03/22 0438  BP: (!) 154/93 135/69 130/63 135/70  Pulse: 89 89 83 86  Resp: '18 20 20 20  '$ Temp: 97.7 F (36.5 C) 98.8 F (37.1 C) 97.8 F (36.6 C) 98.7 F (37.1 C)  TempSrc: Oral Oral Oral Oral  SpO2: 100% 97% 100% 97%  Weight:    126 kg  Height:        Intake/Output Summary (Last 24 hours) at 03/03/2022 0740 Last data filed at 03/03/2022 0622 Gross per 24 hour  Intake 360 ml  Output 1550 ml  Net -1190 ml   Filed Weights   03/02/22 0327 03/03/22 0438  Weight: 123.7 kg 126 kg    Telemetry    Sinus rhythm.  Personally reviewed.  ECG    An ECG dated 03/02/2022 was personally reviewed today and demonstrated:  Sinus rhythm with old anterior infarct pattern and nonspecific ST changes.  Physical Exam   GEN: No acute distress.   Neck: No JVD. Cardiac: RRR, 1/6 systolic murmur, rub, or gallop.  Respiratory: Nonlabored. Clear to auscultation bilaterally. GI: Soft, nontender, bowel sounds present. MS:  Compression stockings bilaterally. Neuro:  Nonfocal. Psych: Alert and oriented x 3. Normal affect.  Labs    Chemistry Recent Labs  Lab 03/02/22 0332  NA 136  K 3.5  CL 104  CO2 26  GLUCOSE 222*  BUN 22*  CREATININE 1.06*  CALCIUM 9.1  GFRNONAA >60  ANIONGAP 6     Hematology Recent Labs  Lab 03/02/22 0334  WBC 10.5  RBC 3.77*  HGB 11.2*  HCT 34.2*  MCV 90.7  MCH 29.7  MCHC 32.7  RDW 13.3  PLT 291    Cardiac Enzymes Recent Labs  Lab 03/02/22 0332 03/02/22 0532  TROPONINIHS 6 7    BNP Recent Labs  Lab 03/02/22 0334  BNP 240.2*     Radiology    DG Chest Portable 1 View  Result Date: 03/02/2022 CLINICAL DATA:  Chest pain EXAM: PORTABLE CHEST 1 VIEW COMPARISON:  09/09/2021 FINDINGS: Cardiac enlargement. Diffuse interstitial pattern to the lungs is similar to prior study and may represent fibrosis or recurrent edema. No pleural effusions. No pneumothorax. Mediastinal contours appear intact. IMPRESSION: Cardiac enlargement. Persistent diffuse interstitial pattern to the lungs possibly representing chronic fibrosis or recurrent edema. Electronically Signed   By: Lucienne Capers M.D.   On: 03/02/2022 03:55    Cardiac Studies   Echocardiogram 09/05/2021:  1. Left ventricular ejection fraction, by estimation, is 40 to 45%. The  left ventricle has mildly decreased function. The left ventricle  demonstrates global hypokinesis. There is mild left ventricular  hypertrophy. Left ventricular diastolic function  could not be evaluated.   2. Right ventricular systolic function is normal. The right ventricular  size is normal.   3. No left atrial/left atrial appendage thrombus was detected.   4. The mitral valve is normal in structure. No evidence of mitral valve  regurgitation. No evidence of mitral stenosis.   5. The aortic valve is normal in structure. Aortic valve regurgitation is  not visualized. No aortic stenosis is present.   6. There is mild (Grade II)  plaque involving the aortic arch and  descending aorta.   7. Agitated saline contrast bubble study was negative, with no evidence  of any interatrial shunt.   Assessment & Plan    1.  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  Approximately 1200 cc net output last 24 hours and she is feeling better symptomatically.  LVEF 40 to 45% and normal RV contraction by echocardiogram in January.  Follow-up study pending.  2.  Multivessel CAD status post DES to the RCA, DES x2 to the ramus intermedius, and DES to the LAD in 2019.  High-sensitivity troponin I levels argue against ACS at this time.  Imdur was added at initial cardiology consultation.  3.  Essential hypertension.  Recent systolics [id number].  4.  Type 2 diabetes mellitus.  States that she did not tolerate Ozempic previously.  Currently on Jardiance and insulin.  Discussed situation this morning, anticipate discharge home later this afternoon after echocardiogram completed.  As far as her current medical regimen would suggest increasing Coreg to 25 mg twice daily, continue Jardiance, continue Entresto, when she reinitiates Norvasc at home go to 5 mg daily, and continue Aldactone 12.5 mg daily.  Imdur 30 mg daily is a new addition as well.  She has been using Lasix on an as-needed basis, would suggest that she take Lasix 40 mg daily for now with repeat BMET in 2 weeks and office visit with cardiology at that time.  Signed, Rozann Lesches, MD  03/03/2022, 7:40 AM

## 2022-03-03 NOTE — Discharge Summary (Signed)
Physician Discharge Summary   Patient: Lauren Delgado MRN: [id number] DOB: 01-21-1986  Admit date:     03/02/2022  Discharge date: 03/03/22  Discharge Physician: Tawni Millers   PCP: Horald Pollen, MD   Recommendations at discharge:    Patient will follow up with Primary Care in 7 to 10 days.  Carvedilol has been increased to 25 mg bid Added isosorbide and changed furosemide from as needed to daily dosing Amlodipine changed to 5 mg daily from 10 mg daily.   Discharge Diagnoses: Principal Problem:   Acute on chronic combined systolic and diastolic CHF (congestive heart failure) (HCC) Active Problems:   chest pain with hx of CAD S/P percutaneous coronary angioplasty   History of CVA (cerebrovascular accident)   Type 2 diabetes mellitus without complication ([place])   Hypertension   Hyperlipidemia associated with type 2 diabetes mellitus ([place])   At risk for sleep apnea   Chest pain  Resolved Problems:   Status post coronary artery stent placement  Hospital Course: Lauren Delgado was admitted to the hospital with the working diagnosis of decompensated heart failure.   36 yo female with the past medical history of heart failure, coronary artery disease, hypertension, dyslipidemia, F8HW, left embolic CVA, and obesity class 3 who presented with dyspnea and chest pain. Reported left precordial chest pain, associated with dyspnea. Only mild improvement with nitroglycerin, prompting her to come to the hospital. On her initial physical examination her blood pressure was 180/103, HR 91, RR 19 and 02 saturation 98% on room air. Lungs with no wheezing or rhonchi, heart with S1 and S2 present and rhythmic, abdomen not distended, trace lower extremity edema.   Na 136. K 3.5 Cl 104, bicarbonate 26 glucose 222 bun 22 cr 1,0  BNP 240  High sensitive troponin 6 and 7  Wbc 10,5 hgb 11,2 plt 291   Chest radiograph with mild cardiomegaly, with bilateral reticular infiltrates.   EKG  94 bpm, normal axis, normal intervals, sinus rhythm with poor R wave progression, q wave V1 and V2, no significant ST segment or T wave changes.   Patient was placed on furosemide for diuresis.    Assessment and Plan: * Acute on chronic combined systolic and diastolic CHF (congestive heart failure) ([place]) 36 year old with history of CAD s/p PCI and combined CHF presenting with 2 day history of shortness of breath and chest pain with associated leg swelling, weight gain, cough and orthopnea found to be in acute on chronic combined CHF by clinical symptoms, elevated BNP and findings on CXR of possible recurrent edema.  -obs to telemetry -strict I/O -takes lasix '40mg'$  prn outpatient, but has been using more frequently recently and last took her '40mg'$  pill last night -will give '40mg'$  IV x 1 now and monitor response -strict I/O and daily weights, unsure about weight gain as weight has been trending upward since stopping the ozempic  -last echo in 09/2021 with EF of 45-50% with mildly decreased LVF and global hypokinesis. Grade 1 DD.   -repeat echo, has only been on entresto <1 month so cardiology may not feel this is necessary  -continue entresto, jardiance, coreg, aldactone.  -cardiology consulted    chest pain with hx of CAD S/P percutaneous coronary angioplasty CAD s/p NSTEMI, DESx 4 (p-mLAD, Ramus, pRCA) in 2019. - Myoview in January 2021 showed EF 36%, intermediate risk study with apical scar and moderate anterior apical ischemia with this scarring of anterior septum -troponin wnl x 2 -ekg with no  acute changes  -she is chest pain free at this time, not indicative of ACS  -continue plavix, lipitor, coreg   History of CVA (cerebrovascular accident) embolic left MCA branch infarct in 12/22-Unknown source.   Bubble study was negative for PFO. TEE without cardiac source of embolism. Telemetry monitoring showed no arrhythmia. -PCP recently ordered a  30 day event monitor  -Hypercoagulable  studies pending per neurology.   -referral placed to neurology as she never followed up post hospitalization. -continue plavix and statin   Type 2 diabetes mellitus without complication ([place]) Recent A1C in 01/2022 was 7.0 Stopped taking ozempic due to SE Hold glipizide  Continue jardiance On 70/30 10 units BID-hold this  will do moderate SSI while inpatient  accuchecks ac/hs   Hypertension Elevated, but has not had her medication since 6/28 Continue coreg 12.'5mg'$  BID, entresto, aldactone every other day Hold norvasc for now  Hydralazine PRN for SBP >180    Hyperlipidemia associated with type 2 diabetes mellitus (HCC) Recent lipid panel with LDL <70 Continue high intensity statin: lipitor '80mg'$  daily   At risk for sleep apnea Was supposed to have an outpatient study done, but they have not called her F/u with PCP   Elevated TSH Mild elevation of TSH. Patient with no hypothyroid symptoms.   Plan to follow up thyroid function testing as outpatient, for now no further inpatient intervention.          Consultants: Cardiology  Procedures performed: none   Disposition: Home Diet recommendation:  Cardiac diet DISCHARGE MEDICATION: Allergies as of 03/03/2022       Reactions   Metformin And Related Diarrhea   Ozempic (0.25 Or 0.5 Mg-dose) [semaglutide(0.25 Or 0.'5mg'$ -dos)] Diarrhea        Medication List     STOP taking these medications    Ozempic (0.25 or 0.5 MG/DOSE) 2 MG/3ML Sopn Generic drug: Semaglutide(0.25 or 0.'5MG'$ /DOS)       TAKE these medications    amLODipine 10 MG tablet Commonly known as: NORVASC Take 0.5 tablets (5 mg total) by mouth daily. What changed: how much to take   atorvastatin 80 MG tablet Commonly known as: LIPITOR Take 1 tablet (80 mg total) by mouth daily.   BD Pen Needle Nano 2nd Gen 32G X 4 MM Misc Generic drug: Insulin Pen Needle USE TO INJECT INSULIN UP TO 4 TIMES DAILY AS NEEDED.   carvedilol 12.5 MG tablet Commonly known  as: COREG Take 2 tablets (25 mg total) by mouth 2 (two) times daily. What changed: how much to take   clopidogrel 75 MG tablet Commonly known as: PLAVIX Take 75 mg by mouth in the morning.   empagliflozin 10 MG Tabs tablet Commonly known as: Jardiance Take 1 tablet (10 mg total) by mouth daily before breakfast.   Entresto 24-26 MG Generic drug: sacubitril-valsartan Take 1 tablet by mouth 2 (two) times daily.   furosemide 40 MG tablet Commonly known as: LASIX Take 1 tablet (40 mg total) by mouth daily. What changed:  when to take this reasons to take this   glipiZIDE 5 MG 24 hr tablet Commonly known as: GLUCOTROL XL TAKE 1 TABLET BY MOUTH EVERY DAY WITH BREAKFAST What changed: See the new instructions.   insulin isophane & regular human KwikPen (70-30) 100 UNIT/ML KwikPen Commonly known as: HUMULIN 70/30 MIX Inject 10 units every morning and evening.   isosorbide mononitrate 30 MG 24 hr tablet Commonly known as: IMDUR Take 1 tablet (30 mg total) by mouth daily. Start  taking on: March 04, 2022   spironolactone 25 MG tablet Commonly known as: ALDACTONE Take 0.5 tablets (12.5 mg total) by mouth daily. What changed:  how much to take when to take this        Discharge Exam: Filed Weights   03/02/22 0327 03/03/22 0438  Weight: 123.7 kg 126 kg   BP 126/62 (BP Location: Left Arm)   Pulse 79   Temp 98.5 F (36.9 C) (Oral)   Resp 20   Ht '5\' 2"'$  (1.575 m)   Wt 126 kg   SpO2 95%   BMI 50.81 kg/m   Patient is feeling better, with no dyspnea or lower extremity edema  Neurology awake and alert ENT with no pallor  Cardiovascular with S1 and S2 present and rhythmic with no gallops or murmurs Respiratory with no rales or wheezing Abdomen not distended or tender No lower extremity edema   Condition at discharge: stable  The results of significant diagnostics from this hospitalization (including imaging, microbiology, ancillary and laboratory) are listed below for  reference.   Imaging Studies: ECHOCARDIOGRAM COMPLETE  Result Date: 03/03/2022    ECHOCARDIOGRAM REPORT   Patient Name:   MELISSIA LAHMAN Date of Exam: 03/03/2022 Medical Rec #:  [id number]          Height:       62.0 in Accession #:    [id number]         Weight:       277.8 lb Date of Birth:  1986/08/26           BSA:          2.198 m Patient Age:    35 years           BP:           135/70 mmHg Patient Gender: F                  HR:           83 bpm. Exam Location:  Inpatient Procedure: 2D Echo, Cardiac Doppler and Color Doppler Indications:    CHF  History:        Patient has prior history of Echocardiogram examinations. CHF,                 CAD; Risk Factors:Hypertension and Diabetes.  Sonographer:    Jyl Heinz Referring Phys: [id number] [place]  1. Left ventricular ejection fraction, by estimation, is 45 to 50%. The left ventricle has mildly decreased function. The left ventricle demonstrates regional wall motion abnormalities (see scoring diagram/findings for description). There is mild asymmetric left ventricular hypertrophy of the septal segment. Left ventricular diastolic parameters are consistent with Grade II diastolic dysfunction (pseudonormalization). Elevated left ventricular end-diastolic pressure.  2. Right ventricular systolic function is normal. The right ventricular size is normal. Tricuspid regurgitation signal is inadequate for assessing PA pressure.  3. A small pericardial effusion is present. The pericardial effusion is anterior to the right ventricle.  4. The mitral valve is grossly normal. Mild mitral valve regurgitation.  5. The aortic valve is tricuspid. Aortic valve regurgitation is not visualized.  6. The inferior vena cava is normal in size with greater than 50% respiratory variability, suggesting right atrial pressure of 3 mmHg. Comparison(s): Prior images reviewed side by side. LVEF has improved somewhat in comparison. FINDINGS  Left Ventricle: Left ventricular  ejection fraction, by estimation, is 45 to 50%. The left ventricle has mildly decreased function. The left ventricle demonstrates  regional wall motion abnormalities. The left ventricular internal cavity size was normal in size. There is mild asymmetric left ventricular hypertrophy of the septal segment. Left ventricular diastolic parameters are consistent with Grade II diastolic dysfunction (pseudonormalization). Elevated left ventricular end-diastolic pressure.  LV Wall Scoring: The inferior wall, basal inferolateral segment, and basal anterolateral segment are hypokinetic. The entire anterior wall, mid and distal lateral wall, entire septum, entire apex, and mid anterolateral segment are normal. Right Ventricle: The right ventricular size is normal. No increase in right ventricular wall thickness. Right ventricular systolic function is normal. Tricuspid regurgitation signal is inadequate for assessing PA pressure. Left Atrium: Left atrial size was normal in size. Right Atrium: Right atrial size was normal in size. Pericardium: A small pericardial effusion is present. The pericardial effusion is anterior to the right ventricle. Mitral Valve: The mitral valve is grossly normal. Mild mitral valve regurgitation. Tricuspid Valve: The tricuspid valve is grossly normal. Tricuspid valve regurgitation is trivial. Aortic Valve: The aortic valve is tricuspid. Aortic valve regurgitation is not visualized. Aortic valve peak gradient measures 7.0 mmHg. Pulmonic Valve: The pulmonic valve was grossly normal. Pulmonic valve regurgitation is trivial. Aorta: The aortic root is normal in size and structure. Venous: The inferior vena cava is normal in size with greater than 50% respiratory variability, suggesting right atrial pressure of 3 mmHg. IAS/Shunts: No atrial level shunt detected by color flow Doppler.  LEFT VENTRICLE PLAX 2D LVIDd:         5.00 cm      Diastology LVIDs:         4.20 cm      LV e' medial:    4.56 cm/s LV PW:          0.90 cm      LV E/e' medial:  17.3 LV IVS:        1.20 cm      LV e' lateral:   3.77 cm/s LVOT diam:     2.10 cm      LV E/e' lateral: 20.9 LV SV:         60 LV SV Index:   27 LVOT Area:     3.46 cm  LV Volumes (MOD) LV vol d, MOD A2C: 135.0 ml LV vol d, MOD A4C: 126.0 ml LV vol s, MOD A2C: 74.8 ml LV vol s, MOD A4C: 65.2 ml LV SV MOD A2C:     60.2 ml LV SV MOD A4C:     126.0 ml LV SV MOD BP:      65.3 ml RIGHT VENTRICLE             IVC RV Basal diam:  2.80 cm     IVC diam: 1.40 cm RV Mid diam:    2.40 cm RV S prime:     12.90 cm/s TAPSE (M-mode): 2.2 cm LEFT ATRIUM             Index        RIGHT ATRIUM           Index LA diam:        4.10 cm 1.87 cm/m   RA Area:     14.20 cm LA Vol (A2C):   51.3 ml 23.34 ml/m  RA Volume:   35.10 ml  15.97 ml/m LA Vol (A4C):   47.8 ml 21.75 ml/m LA Biplane Vol: 49.5 ml 22.52 ml/m  AORTIC VALVE AV Area (Vmax): 2.43 cm AV Vmax:        132.00 cm/s  AV Peak Grad:   7.0 mmHg LVOT Vmax:      92.60 cm/s LVOT Vmean:     73.200 cm/s LVOT VTI:       0.172 m  AORTA Ao Root diam: 2.60 cm Ao Asc diam:  2.60 cm MITRAL VALVE MV Area (PHT): 4.01 cm    SHUNTS MV Decel Time: 189 msec    Systemic VTI:  0.17 m MV E velocity: 78.80 cm/s  Systemic Diam: 2.10 cm MV A velocity: 72.20 cm/s MV E/A ratio:  1.09 Rozann Lesches MD Electronically signed by Rozann Lesches MD Signature Date/Time: 03/03/2022/1:56:11 PM    Final    DG Chest Portable 1 View  Result Date: 03/02/2022 CLINICAL DATA:  Chest pain EXAM: PORTABLE CHEST 1 VIEW COMPARISON:  09/09/2021 FINDINGS: Cardiac enlargement. Diffuse interstitial pattern to the lungs is similar to prior study and may represent fibrosis or recurrent edema. No pleural effusions. No pneumothorax. Mediastinal contours appear intact. IMPRESSION: Cardiac enlargement. Persistent diffuse interstitial pattern to the lungs possibly representing chronic fibrosis or recurrent edema. Electronically Signed   By: Lucienne Capers M.D.   On: 03/02/2022 03:55     Microbiology: Results for orders placed or performed during the hospital encounter of 09/09/21  Resp Panel by RT-PCR (Flu A&B, Covid) Nasopharyngeal Swab     Status: None   Collection Time: 09/09/21 10:45 AM   Specimen: Nasopharyngeal Swab; Nasopharyngeal(NP) swabs in vial transport medium  Result Value Ref Range Status   SARS Coronavirus 2 by RT PCR NEGATIVE NEGATIVE Final    Comment: (NOTE) SARS-CoV-2 target nucleic acids are NOT DETECTED.  The SARS-CoV-2 RNA is generally detectable in upper respiratory specimens during the acute phase of infection. The lowest concentration of SARS-CoV-2 viral copies this assay can detect is 138 copies/mL. A negative result does not preclude SARS-Cov-2 infection and should not be used as the sole basis for treatment or other patient management decisions. A negative result may occur with  improper specimen collection/handling, submission of specimen other than nasopharyngeal swab, presence of viral mutation(s) within the areas targeted by this assay, and inadequate number of viral copies(<138 copies/mL). A negative result must be combined with clinical observations, patient history, and epidemiological information. The expected result is Negative.  Fact Sheet for Patients:  EntrepreneurPulse.com.au  Fact Sheet for Healthcare Providers:  IncredibleEmployment.be  This test is no t yet approved or cleared by the [place] FDA and  has been authorized for detection and/or diagnosis of SARS-CoV-2 by FDA under an Emergency Use Authorization (EUA). This EUA will remain  in effect (meaning this test can be used) for the duration of the COVID-19 declaration under Section 564(b)(1) of the Act, 21 U.S.C.section 360bbb-3(b)(1), unless the authorization is terminated  or revoked sooner.       Influenza A by PCR NEGATIVE NEGATIVE Final   Influenza B by PCR NEGATIVE NEGATIVE Final    Comment: (NOTE) The Xpert  Xpress SARS-CoV-2/FLU/RSV plus assay is intended as an aid in the diagnosis of influenza from Nasopharyngeal swab specimens and should not be used as a sole basis for treatment. Nasal washings and aspirates are unacceptable for Xpert Xpress SARS-CoV-2/FLU/RSV testing.  Fact Sheet for Patients: EntrepreneurPulse.com.au  Fact Sheet for Healthcare Providers: IncredibleEmployment.be  This test is not yet approved or cleared by the [place] FDA and has been authorized for detection and/or diagnosis of SARS-CoV-2 by FDA under an Emergency Use Authorization (EUA). This EUA will remain in effect (meaning this test can be used) for the duration  of the COVID-19 declaration under Section 564(b)(1) of the Act, 21 U.S.C. section 360bbb-3(b)(1), unless the authorization is terminated or revoked.  Performed at [facility], [address] [place], [place] [address]     Labs: CBC: Recent Labs  Lab 03/02/22 0334  WBC 10.5  NEUTROABS 7.7  HGB 11.2*  HCT 34.2*  MCV 90.7  PLT 338   Basic Metabolic Panel: Recent Labs  Lab 03/02/22 0332 03/02/22 1615  NA 136  --   K 3.5  --   CL 104  --   CO2 26  --   GLUCOSE 222*  --   BUN 22*  --   CREATININE 1.06*  --   CALCIUM 9.1  --   MG  --  1.9   Liver Function Tests: No results for input(s): "AST", "ALT", "ALKPHOS", "BILITOT", "PROT", "ALBUMIN" in the last 168 hours. CBG: Recent Labs  Lab 03/02/22 1228 03/02/22 1617 03/02/22 2048 03/03/22 0602 03/03/22 1126  GLUCAP 258* 189* 228* 279* 177*    Discharge time spent: greater than 30 minutes.  Signed: Tawni Millers, MD [facility] 03/03/2022

## 2022-03-05 ENCOUNTER — Other Ambulatory Visit: Payer: Self-pay | Admitting: Nurse Practitioner

## 2022-03-07 ENCOUNTER — Telehealth: Payer: Self-pay | Admitting: Emergency Medicine

## 2022-03-07 NOTE — Telephone Encounter (Signed)
..   Medicaid Managed Care   Unsuccessful Outreach Note  03/07/2022 Name: Lauren Delgado MRN: [id number] DOB: [birth year]  Referred by: Horald Pollen, MD Reason for referral : High Risk Managed Medicaid (I called the patient today to get her scheduled with the MM Team. I left my name and number on her VM.)   An unsuccessful telephone outreach was attempted today. The patient was referred to the case management team for assistance with care management and care coordination.   Follow Up Plan: The care management team will reach out to the patient again over the next 7-14 days.   [place]

## 2022-03-30 ENCOUNTER — Other Ambulatory Visit (INDEPENDENT_AMBULATORY_CARE_PROVIDER_SITE_OTHER): Payer: Self-pay | Admitting: Student

## 2022-04-03 ENCOUNTER — Ambulatory Visit (INDEPENDENT_AMBULATORY_CARE_PROVIDER_SITE_OTHER): Payer: Medicaid Other | Admitting: Emergency Medicine

## 2022-04-03 ENCOUNTER — Other Ambulatory Visit: Payer: Self-pay | Admitting: Emergency Medicine

## 2022-04-03 ENCOUNTER — Encounter: Payer: Self-pay | Admitting: Emergency Medicine

## 2022-04-03 VITALS — BP 132/76 | HR 83 | Temp 98.6°F | Ht 62.0 in | Wt 283.2 lb

## 2022-04-03 DIAGNOSIS — E1165 Type 2 diabetes mellitus with hyperglycemia: Secondary | ICD-10-CM

## 2022-04-03 DIAGNOSIS — E1169 Type 2 diabetes mellitus with other specified complication: Secondary | ICD-10-CM

## 2022-04-03 DIAGNOSIS — R079 Chest pain, unspecified: Secondary | ICD-10-CM | POA: Diagnosis not present

## 2022-04-03 DIAGNOSIS — Z8673 Personal history of transient ischemic attack (TIA), and cerebral infarction without residual deficits: Secondary | ICD-10-CM

## 2022-04-03 DIAGNOSIS — E785 Hyperlipidemia, unspecified: Secondary | ICD-10-CM | POA: Diagnosis not present

## 2022-04-03 DIAGNOSIS — I5042 Chronic combined systolic (congestive) and diastolic (congestive) heart failure: Secondary | ICD-10-CM

## 2022-04-03 LAB — POCT GLYCOSYLATED HEMOGLOBIN (HGB A1C): Hemoglobin A1C: 8.1 % — AB (ref 4.0–5.6)

## 2022-04-03 MED ORDER — CARVEDILOL 25 MG PO TABS: 25.0000 mg | ORAL_TABLET | Freq: Two times a day (BID) | ORAL | 3 refills | Status: DC

## 2022-04-03 MED ORDER — RYBELSUS 7 MG PO TABS: 7.0000 mg | ORAL_TABLET | Freq: Every day | ORAL | 5 refills | Status: DC

## 2022-04-03 NOTE — Patient Instructions (Signed)

## 2022-04-03 NOTE — Assessment & Plan Note (Signed)
Clinically stable.  Normal EKG. Most likely noncardiac origin. Follows up with cardiologist on a regular basis.
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
## 2022-04-03 NOTE — Assessment & Plan Note (Signed)
No clinical signs of congestive heart failure.  Stable. Continue Entresto twice a day and carvedilol 25 mg twice a day

## 2022-04-03 NOTE — Assessment & Plan Note (Signed)
Uncontrolled diabetes with hemoglobin A1c higher than before at 8.1. On NovoLog 70/30 insulin 10 units twice a day Continue Jardiance 10 mg daily and glipizide 5 mg once a day. Was unable to tolerate high doses of Ozempic Start Rybelsus 7 mg daily. Diet and nutrition discussed. Continue atorvastatin 80 mg daily. Cardiovascular risks associated with diabetes and dyslipidemia discussed. Follow-up in 3 months.

## 2022-04-05 ENCOUNTER — Telehealth: Payer: Self-pay | Admitting: Emergency Medicine

## 2022-04-05 NOTE — Telephone Encounter (Signed)
Patient was prescribed Rybelsus and her insurance does not cover it.  Please send in a different medication.  Please send to [facility] in [place], [place]

## 2022-04-11 ENCOUNTER — Encounter: Payer: Self-pay | Admitting: Emergency Medicine

## 2022-04-11 ENCOUNTER — Other Ambulatory Visit (INDEPENDENT_AMBULATORY_CARE_PROVIDER_SITE_OTHER): Payer: Self-pay | Admitting: Student

## 2022-04-11 ENCOUNTER — Telehealth: Payer: Self-pay | Admitting: Emergency Medicine

## 2022-04-11 DIAGNOSIS — N644 Mastodynia: Secondary | ICD-10-CM

## 2022-04-11 NOTE — Telephone Encounter (Signed)
Please order mammogram as requested.  Thanks.

## 2022-04-11 NOTE — Telephone Encounter (Signed)
Patient is still experiencing pain in her left breast.  She would like to have a mammogram ordered [facility] [place] - Please advise.

## 2022-04-12 ENCOUNTER — Telehealth: Payer: Self-pay | Admitting: *Deleted

## 2022-04-12 ENCOUNTER — Other Ambulatory Visit: Payer: Self-pay | Admitting: Emergency Medicine

## 2022-04-12 MED ORDER — TRULICITY 0.75 MG/0.5ML ~~LOC~~ SOAJ: 0.7500 mg | SUBCUTANEOUS | 0 refills | Status: DC

## 2022-04-12 MED ORDER — INSULIN ISOPHANE & REGULAR (HUMAN 70-30)100 UNIT/ML KWIKPEN: PEN_INJECTOR | SUBCUTANEOUS | 0 refills | Status: DC

## 2022-04-12 MED ORDER — ISOSORBIDE MONONITRATE ER 30 MG PO TB24: 30.0000 mg | ORAL_TABLET | Freq: Every day | ORAL | 1 refills | Status: DC

## 2022-04-12 NOTE — Telephone Encounter (Signed)
Okay to fill.  Thanks.

## 2022-04-12 NOTE — Telephone Encounter (Signed)
PA for Trulicity submitted, Awaiting response  Key: [id number]

## 2022-04-12 NOTE — Telephone Encounter (Signed)
New prescriptions sent to requested pharmacy

## 2022-04-12 NOTE — Telephone Encounter (Signed)
Patient requesting a refill of her Insulin 70/30 and her Imdur. Original rx was sent in by another provider please advise if ok to fill   Patient also wants to try Trulicity due to Rybelsus not being covered by her insurance

## 2022-04-12 NOTE — Addendum Note (Signed)
Addended by: Rae Mar on: 04/12/2022 01:24 PM   Modules accepted: Orders

## 2022-04-12 NOTE — Telephone Encounter (Signed)
Sent pt request to PCP.

## 2022-04-12 NOTE — Telephone Encounter (Signed)
Mammogram ordered placed

## 2022-04-12 NOTE — Telephone Encounter (Signed)
Yes 0.75 mg weekly.

## 2022-04-13 ENCOUNTER — Other Ambulatory Visit: Payer: Self-pay | Admitting: Emergency Medicine

## 2022-04-13 DIAGNOSIS — N644 Mastodynia: Secondary | ICD-10-CM

## 2022-04-13 NOTE — Telephone Encounter (Signed)
Rec'd determination This request has received a Favorable outcome. Effective 04/13/22 through 04/13/2023. Faxed approval to [email or web address]

## 2022-04-16 ENCOUNTER — Encounter: Payer: Self-pay | Admitting: Emergency Medicine

## 2022-04-16 ENCOUNTER — Other Ambulatory Visit: Payer: Self-pay | Admitting: Emergency Medicine

## 2022-04-16 MED ORDER — VALACYCLOVIR HCL 500 MG PO TABS: 500.0000 mg | ORAL_TABLET | Freq: Two times a day (BID) | ORAL | 3 refills | Status: DC

## 2022-04-16 NOTE — Telephone Encounter (Signed)
New prescription for valacyclovir sent to pharmacy of record.  Thanks.

## 2022-04-19 ENCOUNTER — Other Ambulatory Visit (INDEPENDENT_AMBULATORY_CARE_PROVIDER_SITE_OTHER): Payer: Self-pay | Admitting: Emergency Medicine

## 2022-04-24 ENCOUNTER — Other Ambulatory Visit: Payer: Self-pay | Admitting: Emergency Medicine

## 2022-04-26 ENCOUNTER — Ambulatory Visit: Payer: Medicaid Other | Admitting: Emergency Medicine

## 2022-04-27 ENCOUNTER — Telehealth: Payer: Self-pay | Admitting: Emergency Medicine

## 2022-04-27 NOTE — Telephone Encounter (Signed)
..   Medicaid Managed Care   Unsuccessful Outreach Note  04/27/2022 Name: Lauren Delgado MRN: [id number] DOB: September 10, 1985  Referred by: Horald Pollen, MD Reason for referral : High Risk Managed Medicaid (I called the patient today to get her scheduled with the MM Team. I left my name and number on her VM.)   A second unsuccessful telephone outreach was attempted today. The patient was referred to the case management team for assistance with care management and care coordination.   Follow Up Plan: The care management team will reach out to the patient again over the next 14 days.    [place]

## 2022-04-28 ENCOUNTER — Other Ambulatory Visit: Payer: Self-pay | Admitting: Nurse Practitioner

## 2022-04-30 ENCOUNTER — Ambulatory Visit
Admission: RE | Admit: 2022-04-30 | Discharge: 2022-04-30 | Disposition: A | Discharge status: Home / Self Care | Payer: Medicaid Other | Source: Ambulatory Visit | Attending: Emergency Medicine | Admitting: Emergency Medicine

## 2022-04-30 ENCOUNTER — Other Ambulatory Visit: Payer: Self-pay | Admitting: Emergency Medicine

## 2022-04-30 DIAGNOSIS — N644 Mastodynia: Secondary | ICD-10-CM | POA: Diagnosis not present

## 2022-04-30 DIAGNOSIS — N6310 Unspecified lump in the right breast, unspecified quadrant: Secondary | ICD-10-CM

## 2022-04-30 DIAGNOSIS — R928 Other abnormal and inconclusive findings on diagnostic imaging of breast: Secondary | ICD-10-CM | POA: Diagnosis not present

## 2022-05-08 ENCOUNTER — Ambulatory Visit: Payer: Medicaid Other | Attending: Nurse Practitioner | Admitting: Nurse Practitioner

## 2022-05-08 ENCOUNTER — Telehealth: Payer: Self-pay | Admitting: Emergency Medicine

## 2022-05-08 ENCOUNTER — Encounter: Payer: Self-pay | Admitting: Nurse Practitioner

## 2022-05-08 VITALS — BP 126/72 | HR 84 | Ht 62.0 in | Wt 292.0 lb

## 2022-05-08 DIAGNOSIS — Z8673 Personal history of transient ischemic attack (TIA), and cerebral infarction without residual deficits: Secondary | ICD-10-CM | POA: Diagnosis not present

## 2022-05-08 DIAGNOSIS — I5042 Chronic combined systolic (congestive) and diastolic (congestive) heart failure: Secondary | ICD-10-CM | POA: Diagnosis not present

## 2022-05-08 DIAGNOSIS — I10 Essential (primary) hypertension: Secondary | ICD-10-CM

## 2022-05-08 DIAGNOSIS — I2510 Atherosclerotic heart disease of native coronary artery without angina pectoris: Secondary | ICD-10-CM | POA: Diagnosis not present

## 2022-05-08 DIAGNOSIS — R42 Dizziness and giddiness: Secondary | ICD-10-CM

## 2022-05-08 DIAGNOSIS — E785 Hyperlipidemia, unspecified: Secondary | ICD-10-CM

## 2022-05-08 DIAGNOSIS — Z794 Long term (current) use of insulin: Secondary | ICD-10-CM | POA: Diagnosis not present

## 2022-05-08 DIAGNOSIS — E118 Type 2 diabetes mellitus with unspecified complications: Secondary | ICD-10-CM

## 2022-05-08 MED ORDER — SACUBITRIL-VALSARTAN 49-51 MG PO TABS: 1.0000 | ORAL_TABLET | Freq: Two times a day (BID) | ORAL | 1 refills | Status: DC
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
## 2022-05-08 NOTE — Telephone Encounter (Signed)
..   Medicaid Managed Care   Unsuccessful Outreach Note  05/08/2022 Name: Lauren Delgado MRN: [id number] DOB: Nov 01, 1985  Referred by: Horald Pollen, MD Reason for referral : High Risk Managed Medicaid (I called the patient today to get her scheduled with the MM Team. I left my name and number on her VM.)   Third unsuccessful telephone outreach was attempted today. The patient was referred to the case management team for assistance with care management and care coordination. The patient's primary care provider has been notified of our unsuccessful attempts to make or maintain contact with the patient. The care management team is pleased to engage with this patient at any time in the future should he/she be interested in assistance from the care management team.   Follow Up Plan: We have been unable to make contact with the patient for follow up. The care management team is available to follow up with the patient after provider conversation with the patient regarding recommendation for care management engagement and subsequent re-referral to the care management team.    [place], [place]

## 2022-05-08 NOTE — Patient Instructions (Addendum)
Medication Instructions:  Increase Entresto 49/51 mg twice daily  *If you need a refill on your cardiac medications before your next appointment, please call your pharmacy*   Lab Work: Your physician recommends that you return for lab work in 2 weeks BMET If you have labs (blood work) drawn today and your tests are completely normal, you will receive your results only by: [place] (if you have MyChart) OR A paper copy in the mail If you have any lab test that is abnormal or we need to change your treatment, we will call you to review the results.   Testing/Procedures: NONE ordered at this time of appointment     Follow-Up: At [facility], you and your health needs are our priority.  As part of our continuing mission to provide you with exceptional heart care, we have created designated Provider Care Teams.  These Care Teams include your primary Cardiologist (physician) and Advanced Practice Providers (APPs -  Physician Assistants and Nurse Practitioners) who all work together to provide you with the care you need, when you need it.  We recommend signing up for the patient portal called "MyChart".  Sign up information is provided on this After Visit Summary.  MyChart is used to connect with patients for Virtual Visits (Telemedicine).  Patients are able to view lab/test results, encounter notes, upcoming appointments, etc.  Non-urgent messages can be sent to your provider as well.   To learn more about what you can do with MyChart, go to [email or web address].    Your next appointment:   3 & 6  month(s)  The format for your next appointment:   In Person  Provider:   Pixie Casino, MD (6 Months)  or Diona Browner, NP (3 months)       Other Instructions Heart Failure Education:  Weigh yourself EVERY morning after you go to the bathroom but before you eat or drink anything. Write this number down in a weight log/diary. If you gain 3 pounds overnight or 5 pounds in  a week, call the office. Take your medicines as prescribed. If you have concerns about your medications, please call us before you stop taking them. Eat low salt foods--Limit salt (sodium) to 2000 mg per day. This will help prevent your body from holding onto fluid. Read food labels as many processed foods have a lot of sodium, especially canned goods and prepackaged meats. If you would like some assistance choosing low sodium foods, we would be happy to set you up with a nutritionist. Limit all fluids for the day to less than 2 liters (64 ounces). Fluid includes all drinks, coffee, juice, ice chips, soup, jello, and all other liquids. Stay as active as you can everyday. Staying active will give you more energy and make your muscles stronger. Start with 5 minutes at a time and work your way up to 30 minutes a day. Break up your activities--do some in the morning and some in the afternoon. Start with 3 days per week and work your way up to 5 days as you can.  If you have chest pain, feel short of breath, dizzy, or lightheaded, STOP. If you don't feel better after a short rest, call 911. If you do feel better, call the office to let us know you have symptoms with exercise.  Important Information About Sugar

## 2022-05-10 ENCOUNTER — Other Ambulatory Visit: Payer: Self-pay | Admitting: Emergency Medicine

## 2022-05-22 ENCOUNTER — Other Ambulatory Visit: Payer: Self-pay | Admitting: Emergency Medicine

## 2022-06-05 ENCOUNTER — Other Ambulatory Visit: Payer: Self-pay | Admitting: Nurse Practitioner

## 2022-06-12 ENCOUNTER — Other Ambulatory Visit: Payer: Self-pay | Admitting: Emergency Medicine

## 2022-06-24 ENCOUNTER — Emergency Department (HOSPITAL_COMMUNITY): Payer: Medicaid Other

## 2022-06-24 ENCOUNTER — Emergency Department (HOSPITAL_BASED_OUTPATIENT_CLINIC_OR_DEPARTMENT_OTHER): Payer: Medicaid Other

## 2022-06-24 ENCOUNTER — Other Ambulatory Visit: Payer: Self-pay

## 2022-06-24 ENCOUNTER — Encounter (HOSPITAL_BASED_OUTPATIENT_CLINIC_OR_DEPARTMENT_OTHER): Payer: Self-pay | Admitting: Emergency Medicine

## 2022-06-24 ENCOUNTER — Emergency Department (HOSPITAL_BASED_OUTPATIENT_CLINIC_OR_DEPARTMENT_OTHER)
Admission: EM | Admit: 2022-06-24 | Discharge: 2022-06-24 | Disposition: A | Discharge status: Home / Self Care | Payer: Medicaid Other | Attending: Emergency Medicine | Admitting: Emergency Medicine

## 2022-06-24 DIAGNOSIS — Z79899 Other long term (current) drug therapy: Secondary | ICD-10-CM | POA: Insufficient documentation

## 2022-06-24 DIAGNOSIS — R531 Weakness: Secondary | ICD-10-CM | POA: Insufficient documentation

## 2022-06-24 DIAGNOSIS — R29818 Other symptoms and signs involving the nervous system: Secondary | ICD-10-CM | POA: Diagnosis not present

## 2022-06-24 DIAGNOSIS — R200 Anesthesia of skin: Secondary | ICD-10-CM | POA: Insufficient documentation

## 2022-06-24 DIAGNOSIS — Z794 Long term (current) use of insulin: Secondary | ICD-10-CM | POA: Diagnosis not present

## 2022-06-24 DIAGNOSIS — E119 Type 2 diabetes mellitus without complications: Secondary | ICD-10-CM | POA: Diagnosis not present

## 2022-06-24 DIAGNOSIS — I509 Heart failure, unspecified: Secondary | ICD-10-CM | POA: Insufficient documentation

## 2022-06-24 DIAGNOSIS — Z7984 Long term (current) use of oral hypoglycemic drugs: Secondary | ICD-10-CM | POA: Insufficient documentation

## 2022-06-24 DIAGNOSIS — Z7902 Long term (current) use of antithrombotics/antiplatelets: Secondary | ICD-10-CM | POA: Diagnosis not present

## 2022-06-24 DIAGNOSIS — R918 Other nonspecific abnormal finding of lung field: Secondary | ICD-10-CM | POA: Diagnosis not present

## 2022-06-24 DIAGNOSIS — I110 Hypertensive heart disease with heart failure: Secondary | ICD-10-CM | POA: Insufficient documentation

## 2022-06-24 DIAGNOSIS — I2510 Atherosclerotic heart disease of native coronary artery without angina pectoris: Secondary | ICD-10-CM | POA: Insufficient documentation

## 2022-06-24 LAB — DIFFERENTIAL
Abs Immature Granulocytes: 0.02 10*3/uL (ref 0.00–0.07)
Basophils Absolute: 0 10*3/uL (ref 0.0–0.1)
Basophils Relative: 0 %
Eosinophils Absolute: 0 10*3/uL (ref 0.0–0.5)
Eosinophils Relative: 1 %
Immature Granulocytes: 0 %
Lymphocytes Relative: 26 %
Lymphs Abs: 2 10*3/uL (ref 0.7–4.0)
Monocytes Absolute: 0.4 10*3/uL (ref 0.1–1.0)
Monocytes Relative: 6 %
Neutro Abs: 5.1 10*3/uL (ref 1.7–7.7)
Neutrophils Relative %: 67 %

## 2022-06-24 LAB — URINALYSIS, ROUTINE W REFLEX MICROSCOPIC
Bilirubin Urine: NEGATIVE
Glucose, UA: >1000 mg/dL — AB
Ketones, ur: NEGATIVE mg/dL
Nitrite: NEGATIVE
Protein, ur: 100 mg/dL — AB
Specific Gravity, Urine: 1.021 (ref 1.005–1.030)
pH: 5.5 (ref 5.0–8.0)

## 2022-06-24 LAB — RAPID URINE DRUG SCREEN, HOSP PERFORMED
Amphetamines: NOT DETECTED
Barbiturates: NOT DETECTED
Benzodiazepines: NOT DETECTED
Cocaine: NOT DETECTED
Opiates: NOT DETECTED
Tetrahydrocannabinol: NOT DETECTED

## 2022-06-24 LAB — COMPREHENSIVE METABOLIC PANEL
ALT: 8 U/L (ref 0–44)
AST: 14 U/L — ABNORMAL LOW (ref 15–41)
Albumin: 3.8 g/dL (ref 3.5–5.0)
Alkaline Phosphatase: 106 U/L (ref 38–126)
Anion gap: 10 (ref 5–15)
BUN: 21 mg/dL — ABNORMAL HIGH (ref 6–20)
CO2: 24 mmol/L (ref 22–32)
Calcium: 9.5 mg/dL (ref 8.9–10.3)
Chloride: 104 mmol/L (ref 98–111)
Creatinine, Ser: 0.93 mg/dL (ref 0.44–1.00)
GFR, Estimated: >60 mL/min (ref >60)
Glucose, Bld: 157 mg/dL — ABNORMAL HIGH (ref 70–99)
Potassium: 4.4 mmol/L (ref 3.5–5.1)
Sodium: 138 mmol/L (ref 135–145)
Total Bilirubin: 0.4 mg/dL (ref 0.3–1.2)
Total Protein: 7.1 g/dL (ref 6.5–8.1)

## 2022-06-24 LAB — PREGNANCY, URINE: Preg Test, Ur: NEGATIVE

## 2022-06-24 LAB — CBC
HCT: 36.9 % (ref 36.0–46.0)
Hemoglobin: 12.2 g/dL (ref 12.0–15.0)
MCH: 29.5 pg (ref 26.0–34.0)
MCHC: 33.1 g/dL (ref 30.0–36.0)
MCV: 89.3 fL (ref 80.0–100.0)
Platelets: 283 10*3/uL (ref 150–400)
RBC: 4.13 MIL/uL (ref 3.87–5.11)
RDW: 13.2 % (ref 11.5–15.5)
WBC: 7.6 10*3/uL (ref 4.0–10.5)
nRBC: 0 % (ref 0.0–0.2)

## 2022-06-24 LAB — APTT: aPTT: 29 seconds (ref 24–36)

## 2022-06-24 LAB — PROTIME-INR
INR: 1 (ref 0.8–1.2)
Prothrombin Time: 13.2 seconds (ref 11.4–15.2)

## 2022-06-24 LAB — ETHANOL: Alcohol, Ethyl (B): <10 mg/dL (ref <10)

## 2022-06-24 MED ORDER — IOHEXOL 350 MG/ML SOLN
100.0000 mL | Freq: Once | INTRAVENOUS | Status: AC | PRN
  Administered 2022-06-24: 75 mL via INTRAVENOUS
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
## 2022-06-24 NOTE — ED Notes (Signed)
Patient transported to MRI 

## 2022-06-24 NOTE — ED Notes (Signed)
Pt transferred from [facility] via [facility] for MRI. Pt experienced R sided arm numbness and R sided facial numbness. LKW 1155. R sided arm numbness has resolved but R sided facial numbness remains. CT clear per [facility]. VSS w/ [facility].

## 2022-06-24 NOTE — Discharge Instructions (Signed)
The MRI shows no evidence of stroke.  We recommend that you follow-up with your neurologist as planned early next month.  Return to the emergency room if you have repeat episode of one-sided weakness, numbness, slurred speech, sudden vision change or loss, sudden balance issues.

## 2022-06-24 NOTE — ED Triage Notes (Addendum)
Pt via pov from home with right sided face and arm numbness that began at 1155 this morning. Pt has hx of stroke in 2022 with same symptoms. Pt states her arm is mostly resolved and that her face is still numb. Pt  also reports that she could not express herself well (she doesn't remember if she was talking or not) for a period of approximately 5 minutes. Pt alert & oriented, nad noted.   No facial droop noted; no drift of arms or legs; strength equal in both hands.

## 2022-06-24 NOTE — ED Provider Notes (Addendum)
  Physical Exam  BP 139/74   Pulse 73   Temp 98.1 F (36.7 C)   Resp 20   Ht '5\' 2"'$  (1.575 m)   Wt 133.4 kg   LMP 06/09/2022 (Approximate)   SpO2 98%   BMI 53.77 kg/m   Physical Exam  Procedures  Procedures  ED Course / MDM    Medical Decision Making Amount and/or Complexity of Data Reviewed Labs: ordered. Radiology: ordered.  Risk Prescription drug management.   Patient transferred to [facility] emergency room for MRI.  Patient has history of diabetes, CAD, previous stroke.  Patient states that with her previous stroke she had right-sided deficits.  The current episode also consistent with previous stroke, and that patient is having right-sided facial numbness, right-sided weakness and slurred speech.  Her symptoms have improved over time, and she is feeling a lot better.  She was transferred to [facility], ER for MRI.  If MRI negative, patient feeling better then she can be discharged.    Varney Biles, MD 06/24/22 1638   8:22 PM I am not able to see the radiology read.  Discussed case with Dr. Cheral Marker.  There is no evidence of acute stroke.  I called again the radiology service.  They said that the MRI was read around 630.  Impression they indicate that patient had areas of hyperdensity with a differential of chronic stroke, demyelinating process.  Dr. Cheral Marker did not appreciate anything that was concerning for MS.  He wanted to see what the official read was, but that official read is still not up.  I discussed the findings with the patient.  I asked her specifically if she has been having episodes of numbness or tingling or weakness in different areas of her body or different period of time, and she denied.  She said today she had a sudden onset episode that has completely resolved.  She has not had recurrent episodes of neurologic deficits over different parts of her body.  Patient has a neurologist and she has an appointment early next month.  Patient is  stable for discharge from emergency perspective.  I have advised her to have her neurologist review the MRI that was completed today as well.  Strict ER return precautions have been discussed, and patient is agreeing with the plan and is comfortable with the workup done and the recommendations from the ER.    Varney Biles, MD 06/24/22 2024

## 2022-06-24 NOTE — Progress Notes (Signed)
Code stroke activated @ 2707.  Dr. Rory Percy on camera @ 1314.  Pt to CT @ 8675 with return @ 1332.  [name] BSN, RN [name]

## 2022-07-04 ENCOUNTER — Ambulatory Visit (INDEPENDENT_AMBULATORY_CARE_PROVIDER_SITE_OTHER): Payer: Medicaid Other | Admitting: Emergency Medicine

## 2022-07-04 ENCOUNTER — Encounter: Payer: Self-pay | Admitting: Emergency Medicine

## 2022-07-04 VITALS — BP 134/80 | HR 80 | Temp 98.3°F | Ht 62.0 in | Wt 294.0 lb

## 2022-07-04 DIAGNOSIS — E785 Hyperlipidemia, unspecified: Secondary | ICD-10-CM

## 2022-07-04 DIAGNOSIS — I429 Cardiomyopathy, unspecified: Secondary | ICD-10-CM

## 2022-07-04 DIAGNOSIS — E1165 Type 2 diabetes mellitus with hyperglycemia: Secondary | ICD-10-CM | POA: Diagnosis not present

## 2022-07-04 DIAGNOSIS — E1159 Type 2 diabetes mellitus with other circulatory complications: Secondary | ICD-10-CM | POA: Diagnosis not present

## 2022-07-04 DIAGNOSIS — Z23 Encounter for immunization: Secondary | ICD-10-CM | POA: Diagnosis not present

## 2022-07-04 DIAGNOSIS — E1169 Type 2 diabetes mellitus with other specified complication: Secondary | ICD-10-CM

## 2022-07-04 DIAGNOSIS — I152 Hypertension secondary to endocrine disorders: Secondary | ICD-10-CM | POA: Diagnosis not present

## 2022-07-04 LAB — POCT GLYCOSYLATED HEMOGLOBIN (HGB A1C): Hemoglobin A1C: 7.7 % — AB (ref 4.0–5.6)

## 2022-07-04 MED ORDER — ISOSORBIDE MONONITRATE ER 30 MG PO TB24: 30.0000 mg | ORAL_TABLET | Freq: Every day | ORAL | 1 refills | Status: DC

## 2022-07-04 MED ORDER — TRULICITY 1.5 MG/0.5ML ~~LOC~~ SOAJ: 1.5000 mg | SUBCUTANEOUS | 3 refills | Status: DC

## 2022-07-04 MED ORDER — GVOKE HYPOPEN 2-PACK 0.5 MG/0.1ML ~~LOC~~ SOAJ: 0.5000 mg | Freq: Every day | SUBCUTANEOUS | 3 refills | Status: DC | PRN

## 2022-07-04 NOTE — Assessment & Plan Note (Signed)
No signs or symptoms of congestive heart failure. Continue Aldactone 12.5 mg daily, Entresto 49-51 mg twice a day, Imdur 30 mg daily, and carvedilol 25 mg twice a day. Continue Plavix 75 mg daily Continue following up with cardiologist as scheduled.

## 2022-07-04 NOTE — Patient Instructions (Signed)

## 2022-07-04 NOTE — Assessment & Plan Note (Addendum)
Improved hemoglobin A1c at 7.7 Cardiovascular risks associated with uncontrolled diabetes discussed Diet and nutrition discussed Continue Humulin 70/30 10 units twice a day Increase Trulicity to 1.5 mg weekly Continue glipizide 5 mg daily and Jardiance 10 mg daily Continue atorvastatin 80 mg daily. Follow-up in 3 months.
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
## 2022-07-04 NOTE — Assessment & Plan Note (Signed)
Well-controlled hypertension. BP Readings from Last 3 Encounters:  07/04/22 134/80  06/24/22 (!) 147/86  05/08/22 126/72  Continue amlodipine 10 mg daily, carvedilol 25 mg twice a day

## 2022-07-11 ENCOUNTER — Other Ambulatory Visit: Payer: Self-pay | Admitting: Nurse Practitioner
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
## 2022-07-12 ENCOUNTER — Ambulatory Visit: Payer: Medicaid Other | Admitting: Neurology

## 2022-07-12 ENCOUNTER — Encounter: Payer: Self-pay | Admitting: Neurology

## 2022-07-12 VITALS — BP 158/88 | HR 90 | Ht 62.0 in | Wt 296.0 lb

## 2022-07-12 DIAGNOSIS — Z8673 Personal history of transient ischemic attack (TIA), and cerebral infarction without residual deficits: Secondary | ICD-10-CM | POA: Diagnosis not present

## 2022-07-12 DIAGNOSIS — I639 Cerebral infarction, unspecified: Secondary | ICD-10-CM

## 2022-07-12 DIAGNOSIS — I5042 Chronic combined systolic (congestive) and diastolic (congestive) heart failure: Secondary | ICD-10-CM | POA: Diagnosis not present

## 2022-07-12 DIAGNOSIS — E1159 Type 2 diabetes mellitus with other circulatory complications: Secondary | ICD-10-CM | POA: Diagnosis not present

## 2022-07-12 DIAGNOSIS — I152 Hypertension secondary to endocrine disorders: Secondary | ICD-10-CM

## 2022-07-12 DIAGNOSIS — I429 Cardiomyopathy, unspecified: Secondary | ICD-10-CM

## 2022-07-12 DIAGNOSIS — Z9189 Other specified personal risk factors, not elsewhere classified: Secondary | ICD-10-CM | POA: Diagnosis not present

## 2022-07-12 NOTE — Patient Instructions (Signed)
I will refer you for sleep study  Keep a watch on your BP goal is < 130/90 Continue the Plavix  Stay on Lipitor, goal for LDL < 70 Work on Diabetes, goal is A1C < 7.0 Recommend weight loss, exercises, healthy eating  See you back in 6 months

## 2022-07-17 ENCOUNTER — Other Ambulatory Visit: Payer: Self-pay | Admitting: Emergency Medicine

## 2022-08-07 ENCOUNTER — Telehealth: Payer: Self-pay | Admitting: Neurology

## 2022-08-07 NOTE — Telephone Encounter (Signed)
I placed a referral for sleep consult given cryptogenic stroke, snoring, BMI 54 with significant risk factors, I have been told she saw Dr. Claiborne Billings back in 2021 for sleep study, she will apparently need to follow up with at that office for her sleep.   IMPRESSIONS - Increased Upper Airway Resistance Syndrome (UARS) without definitive sleep apnea overall (AHI 3.8/h; but RDI 16.5/h) with mild sleep apnea with supine sleep (AHI 5.8/h) and during REM sleep (AHI 8.6/h). - No significant central sleep apnea occurred during this study (CAI = 0.0/h). - Moderate oxygen desaturation to a nadir of 83.0%. - The patient snored with moderate snoring volume. - Reduced latency to REM sleep. - No cardiac abnormalities were noted during this study. - Clinically significant periodic limb movements did not occur during sleep. No significant associated arousals.   DIAGNOSIS - Sleep Apnea, unspecified G47.30 - UARS - Nocturnal Hypoxemia (327.26 [G47.36 ICD-10]) - Moderate snoring - Excessive Daytime Sleepiness   RECOMMENDATIONS - At present patient may not meet criteria for CPAP therapy. If patient is symptomatic consider initial alternatives to CPAP and snoring.   - Effort should be made to optimize nasal and oropharyngeal patency. - If patient continues to have significant daytime sleepiness (ESS 15), consider re-evaluation with a following day multiple latency sleep test (MLST) to evaluate for narcolepsy or idiopathic hypersomnolence.  - Avoid alcohol, sedatives and other CNS depressants that may worsen sleep apnea and disrupt normal sleep architecture. - Sleep hygiene should be reviewed to assess factors that may improve sleep quality. - Weight management (BMI54) and regular exercise should be initiated.   [Electronically signed] 11/01/2019 02:53 PM   Shelva Majestic MD, [facility], ABSM Diplomate, American Board of Sleep Medicine     NPI: [id number] [place] PH: [phone]   FX:  [phone] [place]

## 2022-08-13 ENCOUNTER — Encounter: Payer: Self-pay | Admitting: Nurse Practitioner

## 2022-08-13 ENCOUNTER — Other Ambulatory Visit: Payer: Self-pay

## 2022-08-13 ENCOUNTER — Ambulatory Visit: Payer: Medicaid Other | Attending: Nurse Practitioner | Admitting: Nurse Practitioner

## 2022-08-13 VITALS — BP 140/90 | HR 85 | Ht 62.0 in | Wt 298.8 lb

## 2022-08-13 DIAGNOSIS — E785 Hyperlipidemia, unspecified: Secondary | ICD-10-CM

## 2022-08-13 DIAGNOSIS — I2510 Atherosclerotic heart disease of native coronary artery without angina pectoris: Secondary | ICD-10-CM

## 2022-08-13 DIAGNOSIS — I10 Essential (primary) hypertension: Secondary | ICD-10-CM

## 2022-08-13 DIAGNOSIS — I5042 Chronic combined systolic (congestive) and diastolic (congestive) heart failure: Secondary | ICD-10-CM

## 2022-08-13 DIAGNOSIS — Z8673 Personal history of transient ischemic attack (TIA), and cerebral infarction without residual deficits: Secondary | ICD-10-CM

## 2022-08-13 DIAGNOSIS — R42 Dizziness and giddiness: Secondary | ICD-10-CM

## 2022-08-13 MED ORDER — ENTRESTO 24-26 MG PO TABS: 1.0000 | ORAL_TABLET | Freq: Two times a day (BID) | ORAL | 3 refills | Status: DC

## 2022-08-13 MED ORDER — SPIRONOLACTONE 25 MG PO TABS: 25.0000 mg | ORAL_TABLET | Freq: Every day | ORAL | 3 refills | Status: DC
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
## 2022-08-13 NOTE — Patient Instructions (Addendum)
Medication Instructions:  Decrease Entresto 24/26 mg twice daily Increase Spironolactone 25 mg daily  *If you need a refill on your cardiac medications before your next appointment, please call your pharmacy*   Lab Work: Your physician recommends that you return for lab work in 2 weeks. BMET  If you have labs (blood work) drawn today and your tests are completely normal, you will receive your results only by: [place] (if you have MyChart) OR A paper copy in the mail If you have any lab test that is abnormal or we need to change your treatment, we will call you to review the results.   Testing/Procedures: NONE ordered at this time of appointment     Follow-Up: At [facility], you and your health needs are our priority.  As part of our continuing mission to provide you with exceptional heart care, we have created designated Provider Care Teams.  These Care Teams include your primary Cardiologist (physician) and Advanced Practice Providers (APPs -  Physician Assistants and Nurse Practitioners) who all work together to provide you with the care you need, when you need it.  We recommend signing up for the patient portal called "MyChart".  Sign up information is provided on this After Visit Summary.  MyChart is used to connect with patients for Virtual Visits (Telemedicine).  Patients are able to view lab/test results, encounter notes, upcoming appointments, etc.  Non-urgent messages can be sent to your provider as well.   To learn more about what you can do with MyChart, go to [email or web address].    Your next appointment:    Keep follow up   The format for your next appointment:   In Person  Provider:   Pixie Casino, MD     Other Instructions   Important Information About Sugar

## 2022-08-15 ENCOUNTER — Other Ambulatory Visit (INDEPENDENT_AMBULATORY_CARE_PROVIDER_SITE_OTHER): Payer: Self-pay | Admitting: Emergency Medicine

## 2022-09-06 ENCOUNTER — Other Ambulatory Visit: Payer: Self-pay

## 2022-09-06 ENCOUNTER — Emergency Department (HOSPITAL_BASED_OUTPATIENT_CLINIC_OR_DEPARTMENT_OTHER): Payer: Medicaid Other | Admitting: Radiology

## 2022-09-06 ENCOUNTER — Emergency Department (HOSPITAL_BASED_OUTPATIENT_CLINIC_OR_DEPARTMENT_OTHER)
Admission: EM | Admit: 2022-09-06 | Discharge: 2022-09-06 | Disposition: A | Discharge status: Home / Self Care | Payer: Medicaid Other | Attending: Emergency Medicine | Admitting: Emergency Medicine

## 2022-09-06 ENCOUNTER — Encounter (HOSPITAL_BASED_OUTPATIENT_CLINIC_OR_DEPARTMENT_OTHER): Payer: Self-pay | Admitting: Emergency Medicine

## 2022-09-06 DIAGNOSIS — R079 Chest pain, unspecified: Secondary | ICD-10-CM | POA: Insufficient documentation

## 2022-09-06 DIAGNOSIS — R0602 Shortness of breath: Secondary | ICD-10-CM | POA: Diagnosis not present

## 2022-09-06 LAB — PREGNANCY, URINE: Preg Test, Ur: NEGATIVE

## 2022-09-06 LAB — BASIC METABOLIC PANEL
Anion gap: 11 (ref 5–15)
BUN: 21 mg/dL — ABNORMAL HIGH (ref 6–20)
CO2: 26 mmol/L (ref 22–32)
Calcium: 9 mg/dL (ref 8.9–10.3)
Chloride: 103 mmol/L (ref 98–111)
Creatinine, Ser: 1.04 mg/dL — ABNORMAL HIGH (ref 0.44–1.00)
GFR, Estimated: >60 mL/min (ref >60)
Glucose, Bld: 171 mg/dL — ABNORMAL HIGH (ref 70–99)
Potassium: 3.9 mmol/L (ref 3.5–5.1)
Sodium: 140 mmol/L (ref 135–145)

## 2022-09-06 LAB — CBC
HCT: 35.3 % — ABNORMAL LOW (ref 36.0–46.0)
Hemoglobin: 11.8 g/dL — ABNORMAL LOW (ref 12.0–15.0)
MCH: 29.6 pg (ref 26.0–34.0)
MCHC: 33.4 g/dL (ref 30.0–36.0)
MCV: 88.7 fL (ref 80.0–100.0)
Platelets: 369 10*3/uL (ref 150–400)
RBC: 3.98 MIL/uL (ref 3.87–5.11)
RDW: 12.6 % (ref 11.5–15.5)
WBC: 10.1 10*3/uL (ref 4.0–10.5)
nRBC: 0 % (ref 0.0–0.2)

## 2022-09-06 LAB — TROPONIN I (HIGH SENSITIVITY)
Troponin I (High Sensitivity): 5 ng/L (ref <18)
Troponin I (High Sensitivity): 5 ng/L (ref <18)

## 2022-09-06 LAB — BRAIN NATRIURETIC PEPTIDE: B Natriuretic Peptide: 93.8 pg/mL (ref 0.0–100.0)

## 2022-09-06 MED ORDER — KETOROLAC TROMETHAMINE 15 MG/ML IJ SOLN
15.0000 mg | Freq: Once | INTRAMUSCULAR | Status: AC
  Administered 2022-09-06: 15 mg via INTRAVENOUS
  Filled 2022-09-06: qty 1

## 2022-09-06 NOTE — ED Notes (Signed)
Discharge instructions and follow up care with PCP and cardiology reviewed and explained, pt verbalized understanding and had no further questions on d/c. Pt caox4 and ambulatory on d/c stating she is feeling better.

## 2022-09-06 NOTE — ED Notes (Signed)
Patient transported to X-ray 

## 2022-09-06 NOTE — ED Triage Notes (Signed)
Chest pain center, right arm hurting, started about a week ago, arm started hurting yesterday. No sickness noted

## 2022-09-06 NOTE — ED Provider Notes (Cosign Needed)
[facility] DEPT Provider Note   CSN: [id number] Arrival date & time: 09/06/22  0841     History  Chief Complaint  Patient presents with   Chest Pain    Lauren Delgado is a 37 y.o. female.  Lauren Delgado is a 37 y.o. female  with history of coronary artery disease and myocardial infarction s/p stent, congestive heart failure, diabetes, hypertension, hyperlipidemia and obesity, who presents to the emergency department for evaluation of chest pain.  Patient reports central chest pain that has been occurring intermittently for 1 week.  She reports that yesterday she also noted some pain in her right arm.  She reports chest pain often last a few minutes up to a few hours.  The worst episode has been today where pain started around 4:00 in the morning and has continued her ED evaluation this morning.  She reports pain can occur at rest or with movement and is not directly associated with exertion.  She has not noted any other weird triggering factors or alleviating factors.  No associated nausea or vomiting.  Pain has not radiated anywhere other than her right arm starting yesterday.  No associated lightheadedness or syncope.  She does report some shortness of breath at times when chest pain comes on.  She has not noted any significant lower extremity swelling or leg pain.  No recent long distance travel or surgeries.  Reports she has been compliant with all medications.  She is followed by Dr. Debara Pickett with cardiology.  No other aggravating relieving factors.  The history is provided by the patient and medical records.  Chest Pain Associated symptoms: shortness of breath   Associated symptoms: no abdominal pain, no cough, no dizziness, no fever, no nausea, no palpitations and no vomiting        Home Medications Prior to Admission medications   Medication Sig Start Date End Date Taking? Authorizing Provider  amLODipine (NORVASC) 10 MG tablet Take 10 mg by mouth  daily.    [provider]  atorvastatin (LIPITOR) 80 MG tablet TAKE 1 TABLET BY MOUTH EVERY DAY 06/05/22   [name], Nadean Corwin, MD  BD PEN NEEDLE NANO 2ND GEN 32G X 4 MM MISC USE TO INJECT INSULIN UP TO 4 TIMES DAILY AS NEEDED. 04/19/22   Horald Pollen, MD  carvedilol (COREG) 25 MG tablet Take 1 tablet (25 mg total) by mouth 2 (two) times daily with a meal. 04/03/22 03/29/23  Horald Pollen, MD  clopidogrel (PLAVIX) 75 MG tablet TAKE 1 TABLET BY MOUTH EVERY DAY 06/05/22   [name], Nadean Corwin, MD  Dulaglutide (TRULICITY) 1.5 NW/2.9FA SOPN Inject 1.5 mg into the skin once a week. 07/04/22   Horald Pollen, MD  empagliflozin (JARDIANCE) 10 MG TABS tablet Take 1 tablet (10 mg total) by mouth daily before breakfast. 10/11/21   [name], NP  furosemide (LASIX) 40 MG tablet Take 1 tablet (40 mg total) by mouth daily. 03/03/22 05/08/22  Arrien, Jimmy Picket, MD  glipiZIDE (GLUCOTROL XL) 5 MG 24 hr tablet TAKE 1 TABLET BY MOUTH EVERY DAY WITH BREAKFAST 08/15/22   Horald Pollen, MD  insulin isophane & regular human KwikPen (HUMULIN 70/30 KWIKPEN) (70-30) 100 UNIT/ML KwikPen INJECT 10 UNITS EVERY MORNING AND EVENING. 07/18/22   Horald Pollen, MD  isosorbide mononitrate (IMDUR) 30 MG 24 hr tablet Take 1 tablet (30 mg total) by mouth daily. 07/04/22 12/31/22  Horald Pollen, MD  sacubitril-valsartan (ENTRESTO) 24-26 MG Take 1  tablet by mouth 2 (two) times daily. 08/13/22   [name], NP  spironolactone (ALDACTONE) 25 MG tablet Take 1 tablet (25 mg total) by mouth daily. 08/13/22   [name], NP  valACYclovir (VALTREX) 500 MG tablet TAKE 1 TABLET BY MOUTH TWICE A DAY 06/12/22   Horald Pollen, MD      Allergies    Metformin and related and Ozempic (0.25 or 0.5 mg-dose) [semaglutide(0.25 or 0.'5mg'$ -dos)]    Review of Systems   Review of Systems  Constitutional:  Negative for chills and fever.  HENT: Negative.    Respiratory:  Positive for shortness of  breath. Negative for cough.   Cardiovascular:  Positive for chest pain. Negative for palpitations and leg swelling.  Gastrointestinal:  Negative for abdominal pain, diarrhea, nausea and vomiting.  Genitourinary:  Negative for dysuria.  Musculoskeletal:  Negative for arthralgias and myalgias.  Neurological:  Negative for dizziness, syncope and light-headedness.  All other systems reviewed and are negative.   Physical Exam Updated Vital Signs BP (!) 178/100   Pulse 90   Temp 98.2 F (36.8 C)   Resp 20   LMP 08/28/2022 (Approximate)   SpO2 100%  Physical Exam Vitals and nursing note reviewed.  Constitutional:      General: She is not in acute distress.    Appearance: Normal appearance. She is well-developed. She is obese. She is not diaphoretic.  HENT:     Head: Normocephalic and atraumatic.  Eyes:     General:        Right eye: No discharge.        Left eye: No discharge.     Pupils: Pupils are equal, round, and reactive to light.  Cardiovascular:     Rate and Rhythm: Normal rate and regular rhythm.     Pulses: Normal pulses.          Radial pulses are 2+ on the right side and 2+ on the left side.       Dorsalis pedis pulses are 2+ on the right side and 2+ on the left side.     Heart sounds: Normal heart sounds. No murmur heard.    No friction rub. No gallop.  Pulmonary:     Effort: Pulmonary effort is normal. No respiratory distress.     Breath sounds: Normal breath sounds. No wheezing or rales.     Comments: Respirations equal and unlabored, patient able to speak in full sentences, lungs clear to auscultation bilaterally  Chest:     Chest wall: Tenderness present.  Abdominal:     General: Bowel sounds are normal. There is no distension.     Palpations: Abdomen is soft. There is no mass.     Tenderness: There is no abdominal tenderness. There is no guarding.     Comments: Abdomen soft, nondistended, nontender to palpation in all quadrants without guarding or peritoneal  signs  Musculoskeletal:        General: No deformity.     Cervical back: Neck supple.     Right lower leg: No tenderness. No edema.     Left lower leg: No tenderness. No edema.  Skin:    General: Skin is warm and dry.     Capillary Refill: Capillary refill takes less than 2 seconds.  Neurological:     Mental Status: She is alert and oriented to person, place, and time.     Coordination: Coordination normal.     Comments: Speech is clear, able to follow  commands Moves extremities without ataxia, coordination intact  Psychiatric:        Mood and Affect: Mood normal.        Behavior: Behavior normal.     ED Results / Procedures / Treatments   Labs (all labs ordered are listed, but only abnormal results are displayed) Labs Reviewed  BASIC METABOLIC PANEL - Abnormal; Notable for the following components:      Result Value   Glucose, Bld 171 (*)    BUN 21 (*)    Creatinine, Ser 1.04 (*)    All other components within normal limits  CBC - Abnormal; Notable for the following components:   Hemoglobin 11.8 (*)    HCT 35.3 (*)    All other components within normal limits  PREGNANCY, URINE  BRAIN NATRIURETIC PEPTIDE  TROPONIN I (HIGH SENSITIVITY)  TROPONIN I (HIGH SENSITIVITY)    EKG EKG Interpretation  Date/Time:  Thursday September 06 2022 09:11:46 EST Ventricular Rate:  94 PR Interval:  146 QRS Duration: 100 QT Interval:  404 QTC Calculation: 506 R Axis:   48 Text Interpretation: Sinus rhythm Anteroseptal infarct, old Prolonged QT interval when compard to prior, similar to prior No STEMI Confirmed by Antony Blackbird [phone]) on 09/06/2022 9:18:43 AM  Radiology DG Chest 2 View  Result Date: 09/06/2022 CLINICAL DATA:  Chest pain starting 1 week ago EXAM: CHEST - 2 VIEW COMPARISON:  CT chest 06/24/2022, chest radiograph 03/02/2022 FINDINGS: The cardiomediastinal silhouette is normal Fine nodularity with coarsened interstitial markings are again seen throughout both lungs  consistent with findings on prior chest CT suggestive of prior granulomatous infection. There is no superimposed acute airspace opacity. There is no pulmonary edema. There is no pleural effusion or pneumothorax There is no acute osseous abnormality. IMPRESSION: No radiographic evidence of acute cardiopulmonary process. Stable fine nodularity and coarsened interstitial markings in both lungs as above. Electronically Signed   By: Valetta Mole M.D.   On: 09/06/2022 09:10    Procedures Procedures    Medications Ordered in ED Medications  ketorolac (TORADOL) 15 MG/ML injection 15 mg (15 mg Intravenous Given 09/06/22 1500)    ED Course/ Medical Decision Making/ A&P                           Medical Decision Making Amount and/or Complexity of Data Reviewed Labs: ordered. Radiology: ordered.  Risk Prescription drug management.   Patient presents to the emergency department with chest pain. Patient nontoxic appearing, in no apparent distress, vitals without significant abnormality. Fairly benign physical exam.   DDX including but not limited to: ACS, pulmonary embolism, dissection, pneumothorax, pneumonia, arrhythmia, severe anemia, MSK, GERD, anxiety, abdominal process .   Additional history obtained:  Chart & nursing note reviewed.  Significant cardiac history with prior MI with stenting, most recent echo showed EF of 45-50%.  Followed by Dr. Debara Pickett  EKG: Sinus rhythm with no significant changes, no STEMI, mild QT prolongation  Lab Tests:  I reviewed & interpreted labs including:  No leukocytosis and stable hemoglobin, glucose 171, creatinine at baseline, no other electrolyte derangements, troponins negative x 2, BNP is not elevated, negative pregnancy  Imaging Studies ordered:  I ordered and viewed the following imaging, agree with radiologist impression:  No active cardiopulmonary disease  ED Course:  I ordered medications including Toradol for potential musculoskeletal chest wall  pain   EKG without obvious acute ischemia, delta troponin negative, doubt ACS. Patient is low risk wells,  PERC negative, doubt pulmonary embolism. Pain is not a tearing sensation, symmetric pulses, no widening of mediastinum on CXR, doubt dissection. Cardiac monitor reviewed, no notable arrhythmias or tachycardia   I consulted cardiology given patient's significant cardiac history and discussed case with Dr. Johnsie Cancel, who reports that this is unlikely cardiac given 2 negative troponins despite ongoing pain, no further emergent workup indicated.  Based on patient's chief complaint, I considered admission might be necessary, however after reassuring ED workup feel patient is reasonable for discharge.   I discussed results, treatment plan, need for PCP and cardiology follow-up, and return precautions with the patient. Provided opportunity for questions, patient confirmed understanding and is in agreement with plan.     Portions of this note were generated with [employer]. Dictation errors may occur despite best attempts at proofreading.         Final Clinical Impression(s) / ED Diagnoses Final diagnoses:  Central chest pain    Rx / DC Orders ED Discharge Orders     None         Jacqlyn Larsen, [place] 09/27/22 4037

## 2022-09-06 NOTE — Discharge Instructions (Signed)
You were seen in the emergency department today for chest pain. Your work-up in the emergency department has been overall reassuring. Your labs have been fairly normal and or similar to previous blood work you have had done. Your EKG and the enzyme we use to check your heart did not show an acute heart attack at this time. Your chest x-ray was normal.   We would like you to follow up closely with your primary care provider and your  cardiologist. Return to the ER immediately should you experience any new or worsening symptoms including but not limited to return of pain, worsened pain, vomiting, shortness of breath, dizziness, lightheadedness, passing out, or any other concerns that you may have.

## 2022-09-07 ENCOUNTER — Telehealth: Payer: Self-pay

## 2022-09-07 NOTE — Patient Outreach (Signed)
Transition Care Management Unsuccessful Follow-up Telephone Call  Date of discharge and from where:  09/06/22 [facility]  Attempts:  1st Attempt  Reason for unsuccessful TCM follow-up call:  Left voice message  Mickel Fuchs, BSW, [place] Medicaid Team  [phone]

## 2022-10-09 ENCOUNTER — Encounter: Payer: Self-pay | Admitting: Emergency Medicine

## 2022-10-09 ENCOUNTER — Ambulatory Visit (INDEPENDENT_AMBULATORY_CARE_PROVIDER_SITE_OTHER): Payer: Medicaid Other | Admitting: Emergency Medicine

## 2022-10-09 VITALS — BP 138/88 | HR 85 | Temp 98.3°F | Ht 62.0 in | Wt 301.1 lb

## 2022-10-09 DIAGNOSIS — Z9189 Other specified personal risk factors, not elsewhere classified: Secondary | ICD-10-CM | POA: Diagnosis not present

## 2022-10-09 DIAGNOSIS — E1169 Type 2 diabetes mellitus with other specified complication: Secondary | ICD-10-CM | POA: Diagnosis not present

## 2022-10-09 DIAGNOSIS — E785 Hyperlipidemia, unspecified: Secondary | ICD-10-CM | POA: Diagnosis not present

## 2022-10-09 DIAGNOSIS — I429 Cardiomyopathy, unspecified: Secondary | ICD-10-CM

## 2022-10-09 DIAGNOSIS — E1159 Type 2 diabetes mellitus with other circulatory complications: Secondary | ICD-10-CM

## 2022-10-09 DIAGNOSIS — E1165 Type 2 diabetes mellitus with hyperglycemia: Secondary | ICD-10-CM

## 2022-10-09 DIAGNOSIS — I152 Hypertension secondary to endocrine disorders: Secondary | ICD-10-CM

## 2022-10-09 DIAGNOSIS — R29818 Other symptoms and signs involving the nervous system: Secondary | ICD-10-CM

## 2022-10-09 DIAGNOSIS — I5042 Chronic combined systolic (congestive) and diastolic (congestive) heart failure: Secondary | ICD-10-CM | POA: Diagnosis not present

## 2022-10-09 LAB — POCT GLYCOSYLATED HEMOGLOBIN (HGB A1C): Hemoglobin A1C: 7.3 % — AB (ref 4.0–5.6)

## 2022-10-09 NOTE — Assessment & Plan Note (Signed)
Stable.  Continue atorvastatin 80 mg daily. Diet and nutrition discussed.

## 2022-10-09 NOTE — Assessment & Plan Note (Signed)
Well-controlled hypertension Continue Aldactone 25 mg daily, Entresto 24-26 twice a day, amlodipine 10 mg and carvedilol 25 mg twice a day Well-controlled diabetes with hemoglobin A1c at 7.3 Continue glipizide 5 mg, Jardiance 10 mg daily, weekly Trulicity 1.5 mg and Humulin insulin 70/30 10 units twice a day Recommend endocrinology evaluation Referral placed today. Cardiovascular risks associated with hypertension and diabetes discussed Diet and nutrition discussed.

## 2022-10-09 NOTE — Patient Instructions (Signed)

## 2022-10-09 NOTE — Assessment & Plan Note (Signed)
Stable.  No clinical findings of acute congestive heart failure

## 2022-10-09 NOTE — Assessment & Plan Note (Signed)
Stable.  No signs of acute congestive heart failure Continue Aldactone 25 mg daily, Entresto 24-26 twice a day, carvedilol 25 mg twice a day and Jardiance 10 mg daily Sees cardiologist on a regular basis

## 2022-10-09 NOTE — Assessment & Plan Note (Signed)
Suspected sleep apnea. Refer for sleep studies today.
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
## 2022-10-24 ENCOUNTER — Other Ambulatory Visit: Payer: Self-pay | Admitting: Nurse Practitioner

## 2022-11-08 ENCOUNTER — Ambulatory Visit: Payer: Medicaid Other | Attending: Internal Medicine | Admitting: Internal Medicine

## 2022-11-08 VITALS — BP 165/99 | HR 83 | Ht 62.0 in | Wt 302.8 lb

## 2022-11-08 DIAGNOSIS — E785 Hyperlipidemia, unspecified: Secondary | ICD-10-CM | POA: Diagnosis not present

## 2022-11-08 DIAGNOSIS — I5042 Chronic combined systolic (congestive) and diastolic (congestive) heart failure: Secondary | ICD-10-CM

## 2022-11-08 DIAGNOSIS — I2510 Atherosclerotic heart disease of native coronary artery without angina pectoris: Secondary | ICD-10-CM

## 2022-11-08 DIAGNOSIS — I10 Essential (primary) hypertension: Secondary | ICD-10-CM

## 2022-11-08 NOTE — Patient Instructions (Signed)
Medication Instructions:  Your physician recommends that you continue on your current medications as directed. Please refer to the Current Medication list given to you today.  *If you need a refill on your cardiac medications before your next appointment, please call your pharmacy*   Follow-Up: At [facility], you and your health needs are our priority.  As part of our continuing mission to provide you with exceptional heart care, we have created designated Provider Care Teams.  These Care Teams include your primary Cardiologist (physician) and Advanced Practice Providers (APPs -  Physician Assistants and Nurse Practitioners) who all work together to provide you with the care you need, when you need it.  We recommend signing up for the patient portal called "MyChart".  Sign up information is provided on this After Visit Summary.  MyChart is used to connect with patients for Virtual Visits (Telemedicine).  Patients are able to view lab/test results, encounter notes, upcoming appointments, etc.  Non-urgent messages can be sent to your provider as well.   To learn more about what you can do with MyChart, go to [email or web address].    Your next appointment:    6 months with Raquel Sarna NP  12 months with Dr. Debara Pickett
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
## 2022-12-20 ENCOUNTER — Other Ambulatory Visit: Payer: Self-pay | Admitting: Nurse Practitioner

## 2023-01-02 ENCOUNTER — Ambulatory Visit: Payer: Medicaid Other | Admitting: Emergency Medicine

## 2023-01-08 ENCOUNTER — Ambulatory Visit: Payer: Medicaid Other | Admitting: Emergency Medicine

## 2023-01-16 ENCOUNTER — Ambulatory Visit: Payer: Medicaid Other | Admitting: Emergency Medicine

## 2023-01-17 ENCOUNTER — Encounter: Payer: Self-pay | Admitting: Emergency Medicine

## 2023-01-17 ENCOUNTER — Ambulatory Visit (INDEPENDENT_AMBULATORY_CARE_PROVIDER_SITE_OTHER): Payer: Medicaid Other | Admitting: Emergency Medicine

## 2023-01-17 VITALS — BP 128/76 | HR 85 | Temp 98.4°F | Ht 62.0 in | Wt 306.0 lb

## 2023-01-17 DIAGNOSIS — I152 Hypertension secondary to endocrine disorders: Secondary | ICD-10-CM | POA: Diagnosis not present

## 2023-01-17 DIAGNOSIS — E1159 Type 2 diabetes mellitus with other circulatory complications: Secondary | ICD-10-CM

## 2023-01-17 DIAGNOSIS — I5042 Chronic combined systolic (congestive) and diastolic (congestive) heart failure: Secondary | ICD-10-CM | POA: Diagnosis not present

## 2023-01-17 DIAGNOSIS — E785 Hyperlipidemia, unspecified: Secondary | ICD-10-CM

## 2023-01-17 DIAGNOSIS — E1165 Type 2 diabetes mellitus with hyperglycemia: Secondary | ICD-10-CM

## 2023-01-17 DIAGNOSIS — Z794 Long term (current) use of insulin: Secondary | ICD-10-CM | POA: Diagnosis not present

## 2023-01-17 DIAGNOSIS — Z7984 Long term (current) use of oral hypoglycemic drugs: Secondary | ICD-10-CM | POA: Diagnosis not present

## 2023-01-17 DIAGNOSIS — E1169 Type 2 diabetes mellitus with other specified complication: Secondary | ICD-10-CM

## 2023-01-17 LAB — POCT GLYCOSYLATED HEMOGLOBIN (HGB A1C): Hemoglobin A1C: 9 % — AB (ref 4.0–5.6)

## 2023-01-17 MED ORDER — TRULICITY 0.75 MG/0.5ML ~~LOC~~ SOAJ: 0.7500 mg | SUBCUTANEOUS | 5 refills | Status: DC

## 2023-01-17 MED ORDER — EMPAGLIFLOZIN 25 MG PO TABS: 25.0000 mg | ORAL_TABLET | Freq: Every day | ORAL | 3 refills | Status: DC
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
## 2023-01-17 NOTE — Patient Instructions (Addendum)
Decrease dose of Trulicity to 0.75 mg weekly Increase dose of Jardiance to 25 mg daily Increase insulin to 15 units twice a day Continue glipizide 5 mg once a day Follow-up with endocrinology.  Diabetes Mellitus and Nutrition, Adult When you have diabetes, or diabetes mellitus, it is very important to have healthy eating habits because your blood sugar (glucose) levels are greatly affected by what you eat and drink. Eating healthy foods in the right amounts, at about the same times every day, can help you: Manage your blood glucose. Lower your risk of heart disease. Improve your blood pressure. Reach or maintain a healthy weight. What can affect my meal plan? Every person with diabetes is different, and each person has different needs for a meal plan. Your health care provider may recommend that you work with a dietitian to make a meal plan that is best for you. Your meal plan may vary depending on factors such as: The calories you need. The medicines you take. Your weight. Your blood glucose, blood pressure, and cholesterol levels. Your activity level. Other health conditions you have, such as heart or kidney disease. How do carbohydrates affect me? Carbohydrates, also called carbs, affect your blood glucose level more than any other type of food. Eating carbs raises the amount of glucose in your blood. It is important to know how many carbs you can safely have in each meal. This is different for every person. Your dietitian can help you calculate how many carbs you should have at each meal and for each snack. How does alcohol affect me? Alcohol can cause a decrease in blood glucose (hypoglycemia), especially if you use insulin or take certain diabetes medicines by mouth. Hypoglycemia can be a life-threatening condition. Symptoms of hypoglycemia, such as sleepiness, dizziness, and confusion, are similar to symptoms of having too much alcohol. Do not drink alcohol if: Your health care  provider tells you not to drink. You are pregnant, may be pregnant, or are planning to become pregnant. If you drink alcohol: Limit how much you have to: 0-1 drink a day for women. 0-2 drinks a day for men. Know how much alcohol is in your drink. In the [place] one drink equals one 12 oz bottle of beer (355 mL), one 5 oz glass of wine (148 mL), or one 1 oz glass of hard liquor (44 mL). Keep yourself hydrated with water, diet soda, or unsweetened iced tea. Keep in mind that regular soda, juice, and other mixers may contain a lot of sugar and must be counted as carbs. What are tips for following this plan?  Reading food labels Start by checking the serving size on the Nutrition Facts label of packaged foods and drinks. The number of calories and the amount of carbs, fats, and other nutrients listed on the label are based on one serving of the item. Many items contain more than one serving per package. Check the total grams (g) of carbs in one serving. Check the number of grams of saturated fats and trans fats in one serving. Choose foods that have a low amount or none of these fats. Check the number of milligrams (mg) of salt (sodium) in one serving. Most people should limit total sodium intake to less than 2,300 mg per day. Always check the nutrition information of foods labeled as "low-fat" or "nonfat." These foods may be higher in added sugar or refined carbs and should be avoided. Talk to your dietitian to identify your daily goals for nutrients listed on  the label. Shopping Avoid buying canned, pre-made, or processed foods. These foods tend to be high in fat, sodium, and added sugar. Shop around the outside edge of the grocery store. This is where you will most often find fresh fruits and vegetables, bulk grains, fresh meats, and fresh dairy products. Cooking Use low-heat cooking methods, such as baking, instead of high-heat cooking methods, such as deep frying. Cook using healthy oils, such  as olive, canola, or sunflower oil. Avoid cooking with butter, cream, or high-fat meats. Meal planning Eat meals and snacks regularly, preferably at the same times every day. Avoid going long periods of time without eating. Eat foods that are high in fiber, such as fresh fruits, vegetables, beans, and whole grains. Eat 4-6 oz (112-168 g) of lean protein each day, such as lean meat, chicken, fish, eggs, or tofu. One ounce (oz) (28 g) of lean protein is equal to: 1 oz (28 g) of meat, chicken, or fish. 1 egg.  cup (62 g) of tofu. Eat some foods each day that contain healthy fats, such as avocado, nuts, seeds, and fish. What foods should I eat? Fruits Berries. Apples. Oranges. Peaches. Apricots. Plums. Grapes. Mangoes. Papayas. Pomegranates. Kiwi. Cherries. Vegetables Leafy greens, including lettuce, spinach, kale, chard, collard greens, mustard greens, and cabbage. Beets. Cauliflower. Broccoli. Carrots. Green beans. Tomatoes. Peppers. Onions. Cucumbers. Brussels sprouts. Grains Whole grains, such as whole-wheat or whole-grain bread, crackers, tortillas, cereal, and pasta. Unsweetened oatmeal. Quinoa. Brown or wild rice. Meats and other proteins Seafood. Poultry without skin. Lean cuts of poultry and beef. Tofu. Nuts. Seeds. Dairy Low-fat or fat-free dairy products such as milk, yogurt, and cheese. The items listed above may not be a complete list of foods and beverages you can eat and drink. Contact a dietitian for more information. What foods should I avoid? Fruits Fruits canned with syrup. Vegetables Canned vegetables. Frozen vegetables with butter or cream sauce. Grains Refined white flour and flour products such as bread, pasta, snack foods, and cereals. Avoid all processed foods. Meats and other proteins Fatty cuts of meat. Poultry with skin. Breaded or fried meats. Processed meat. Avoid saturated fats. Dairy Full-fat yogurt, cheese, or milk. Beverages Sweetened drinks, such as  soda or iced tea. The items listed above may not be a complete list of foods and beverages you should avoid. Contact a dietitian for more information. Questions to ask a health care provider Do I need to meet with a certified diabetes care and education specialist? Do I need to meet with a dietitian? What number can I call if I have questions? When are the best times to check my blood glucose? Where to find more information: American Diabetes Association: diabetes.org Academy of Nutrition and Dietetics: eatright.[employer] of Diabetes and Digestive and Kidney Diseases: [employer] Association of Diabetes Care & Education Specialists: diabeteseducator.org Summary It is important to have healthy eating habits because your blood sugar (glucose) levels are greatly affected by what you eat and drink. It is important to use alcohol carefully. A healthy meal plan will help you manage your blood glucose and lower your risk of heart disease. Your health care provider may recommend that you work with a dietitian to make a meal plan that is best for you. This information is not intended to replace advice given to you by your health care provider. Make sure you discuss any questions you have with your health care provider. Document Revised: 03/23/2020 Document Reviewed: 03/23/2020 Elsevier Patient Education  [place].

## 2023-01-17 NOTE — Assessment & Plan Note (Signed)
Chronic stable condition Continue atorvastatin 80 mg daily Diet and nutrition discussed

## 2023-01-17 NOTE — Assessment & Plan Note (Signed)
Diet and nutrition discussed.  Advised to decrease amount of daily carbohydrate intake and daily calories and increase amount of plant-based protein in her diet. 

## 2023-01-17 NOTE — Assessment & Plan Note (Signed)
BP Readings from Last 3 Encounters:  01/17/23 128/76  11/08/22 (!) 165/99  10/09/22 138/88  Well-controlled hypertension Continue amlodipine 10 mg, carvedilol 25 mg twice a day, furosemide 40 mg, Aldactone 25 mg daily Uncontrolled diabetes with hemoglobin A1c at 9.0 higher than before Not tolerating higher dose of Trulicity Has not been able to connect with endocrinology yet Recommend to decrease dose of Trulicity to 0.75 mg weekly, Increase Jardiance to 25 mg daily, continue daily glipizide 5 mg and increase dose of insulin to 15 units twice a day Cardiovascular risk associated with uncontrolled diabetes discussed Diet and nutrition discussed Recommend to follow-up in 3 months Needs to also follow-up with endocrinology.

## 2023-01-17 NOTE — Assessment & Plan Note (Signed)
Clinically stable.  No signs of acute CHF. Continue Aldactone 25 mg daily, Entresto 24-26 mg twice a day, Jardiance 25 mg daily, furosemide 40 mg daily, carvedilol 25 mg twice a day

## 2023-01-23 ENCOUNTER — Encounter: Payer: Self-pay | Admitting: Emergency Medicine

## 2023-01-24 NOTE — Telephone Encounter (Signed)
Okay to go back to the lower dose.  Thanks.

## 2023-01-25 ENCOUNTER — Other Ambulatory Visit: Payer: Self-pay | Admitting: Emergency Medicine

## 2023-01-25 MED ORDER — SEMAGLUTIDE(0.25 OR 0.5MG/DOS) 2 MG/3ML ~~LOC~~ SOPN: 0.5000 mg | PEN_INJECTOR | SUBCUTANEOUS | 5 refills | Status: DC

## 2023-01-25 NOTE — Telephone Encounter (Signed)
New prescription for Ozempic 0.5 mg weekly sent to pharmacy of record today.  Thanks.

## 2023-01-31 ENCOUNTER — Ambulatory Visit: Payer: Medicaid Other | Admitting: Neurology

## 2023-02-13 LAB — HM DIABETES EYE EXAM

## 2023-03-06 ENCOUNTER — Telehealth: Payer: Self-pay

## 2023-03-06 ENCOUNTER — Other Ambulatory Visit (HOSPITAL_COMMUNITY): Payer: Self-pay

## 2023-03-06 NOTE — Telephone Encounter (Signed)
Patient Advocate Encounter  Prior Authorization for Ozempic (0.25 or 0.5 MG/DOSE) 2MG /3ML pen-injectors has been approved through [employer].    Key: [id number]  Effective: 03-06-2023 to 03-05-2024

## 2023-04-03 ENCOUNTER — Encounter (INDEPENDENT_AMBULATORY_CARE_PROVIDER_SITE_OTHER): Payer: Self-pay

## 2023-04-10 ENCOUNTER — Ambulatory Visit (INDEPENDENT_AMBULATORY_CARE_PROVIDER_SITE_OTHER): Payer: Medicaid Other | Admitting: Neurology

## 2023-04-10 ENCOUNTER — Encounter: Payer: Self-pay | Admitting: Neurology

## 2023-04-10 VITALS — BP 162/93 | HR 89 | Ht 62.0 in | Wt 303.5 lb

## 2023-04-10 DIAGNOSIS — I152 Hypertension secondary to endocrine disorders: Secondary | ICD-10-CM | POA: Diagnosis not present

## 2023-04-10 DIAGNOSIS — E785 Hyperlipidemia, unspecified: Secondary | ICD-10-CM | POA: Diagnosis not present

## 2023-04-10 DIAGNOSIS — Z8673 Personal history of transient ischemic attack (TIA), and cerebral infarction without residual deficits: Secondary | ICD-10-CM

## 2023-04-10 DIAGNOSIS — Z9189 Other specified personal risk factors, not elsewhere classified: Secondary | ICD-10-CM

## 2023-04-10 DIAGNOSIS — Z7985 Long-term (current) use of injectable non-insulin antidiabetic drugs: Secondary | ICD-10-CM | POA: Diagnosis not present

## 2023-04-10 DIAGNOSIS — E1169 Type 2 diabetes mellitus with other specified complication: Secondary | ICD-10-CM | POA: Diagnosis not present

## 2023-04-10 DIAGNOSIS — I639 Cerebral infarction, unspecified: Secondary | ICD-10-CM

## 2023-04-10 DIAGNOSIS — E1159 Type 2 diabetes mellitus with other circulatory complications: Secondary | ICD-10-CM | POA: Diagnosis not present

## 2023-04-10 NOTE — Patient Instructions (Signed)
I will place referral for sleep consult with Dr. Tresa Endo as she has been in the past  Continue to work on weight loss, working with your primary care doctor strict management of vascular risk factors with a goal BP less than 130/90, A1c less than 7.0, LDL less than 70 for secondary stroke prevention. Stay on Plavix for secondary stroke prevention.  Will be due to A1C recheck and Lipid panel
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
## 2023-04-14 NOTE — Progress Notes (Signed)
I agree with the above plan 

## 2023-04-15 ENCOUNTER — Telehealth: Payer: Self-pay | Admitting: Neurology

## 2023-04-15 NOTE — Telephone Encounter (Signed)
Referral for Cardiology fax to [facility] to Dr. Tresa Endo. Phone: [phone], Fax:[phone].

## 2023-04-22 ENCOUNTER — Ambulatory Visit: Payer: Medicaid Other | Admitting: Emergency Medicine

## 2023-04-24 ENCOUNTER — Other Ambulatory Visit (HOSPITAL_COMMUNITY): Payer: Self-pay

## 2023-04-25 ENCOUNTER — Ambulatory Visit: Payer: Medicaid Other | Admitting: Emergency Medicine

## 2023-05-07 ENCOUNTER — Ambulatory Visit (INDEPENDENT_AMBULATORY_CARE_PROVIDER_SITE_OTHER): Payer: Medicaid Other | Admitting: Emergency Medicine

## 2023-05-07 ENCOUNTER — Encounter: Payer: Self-pay | Admitting: Emergency Medicine

## 2023-05-07 VITALS — BP 132/84 | HR 86 | Temp 98.2°F | Resp 16 | Ht 62.0 in | Wt 304.2 lb

## 2023-05-07 DIAGNOSIS — Z7985 Long-term (current) use of injectable non-insulin antidiabetic drugs: Secondary | ICD-10-CM

## 2023-05-07 DIAGNOSIS — I152 Hypertension secondary to endocrine disorders: Secondary | ICD-10-CM

## 2023-05-07 DIAGNOSIS — E1169 Type 2 diabetes mellitus with other specified complication: Secondary | ICD-10-CM | POA: Diagnosis not present

## 2023-05-07 DIAGNOSIS — E1159 Type 2 diabetes mellitus with other circulatory complications: Secondary | ICD-10-CM

## 2023-05-07 DIAGNOSIS — E1165 Type 2 diabetes mellitus with hyperglycemia: Secondary | ICD-10-CM | POA: Diagnosis not present

## 2023-05-07 DIAGNOSIS — I5042 Chronic combined systolic (congestive) and diastolic (congestive) heart failure: Secondary | ICD-10-CM | POA: Diagnosis not present

## 2023-05-07 DIAGNOSIS — E785 Hyperlipidemia, unspecified: Secondary | ICD-10-CM | POA: Diagnosis not present

## 2023-05-07 LAB — COMPREHENSIVE METABOLIC PANEL
ALT: 10 U/L (ref 0–35)
AST: 12 U/L (ref 0–37)
Albumin: 3.6 g/dL (ref 3.5–5.2)
Alkaline Phosphatase: 99 U/L (ref 39–117)
BUN: 17 mg/dL (ref 6–23)
CO2: 26 meq/L (ref 19–32)
Calcium: 9.6 mg/dL (ref 8.4–10.5)
Chloride: 105 meq/L (ref 96–112)
Creatinine, Ser: 1.18 mg/dL (ref 0.40–1.20)
GFR: 59.57 mL/min — ABNORMAL LOW (ref >60.00)
Glucose, Bld: 138 mg/dL — ABNORMAL HIGH (ref 70–99)
Potassium: 4.1 meq/L (ref 3.5–5.1)
Sodium: 139 meq/L (ref 135–145)
Total Bilirubin: 0.3 mg/dL (ref 0.2–1.2)
Total Protein: 7.5 g/dL (ref 6.0–8.3)

## 2023-05-07 LAB — CBC WITH DIFFERENTIAL/PLATELET
Basophils Absolute: 0 10*3/uL (ref 0.0–0.1)
Basophils Relative: 0.2 % (ref 0.0–3.0)
Eosinophils Absolute: 0.1 10*3/uL (ref 0.0–0.7)
Eosinophils Relative: 0.6 % (ref 0.0–5.0)
HCT: 35.6 % — ABNORMAL LOW (ref 36.0–46.0)
Hemoglobin: 11.5 g/dL — ABNORMAL LOW (ref 12.0–15.0)
Lymphocytes Relative: 25.2 % (ref 12.0–46.0)
Lymphs Abs: 2.1 10*3/uL (ref 0.7–4.0)
MCHC: 32.3 g/dL (ref 30.0–36.0)
MCV: 89.6 fl (ref 78.0–100.0)
Monocytes Absolute: 0.5 10*3/uL (ref 0.1–1.0)
Monocytes Relative: 6.2 % (ref 3.0–12.0)
Neutro Abs: 5.7 10*3/uL (ref 1.4–7.7)
Neutrophils Relative %: 67.8 % (ref 43.0–77.0)
Platelets: 379 10*3/uL (ref 150.0–400.0)
RBC: 3.98 Mil/uL (ref 3.87–5.11)
RDW: 13.6 % (ref 11.5–15.5)
WBC: 8.4 10*3/uL (ref 4.0–10.5)

## 2023-05-07 LAB — LIPID PANEL
Cholesterol: 163 mg/dL (ref 0–200)
HDL: 37.5 mg/dL — ABNORMAL LOW (ref >39.00)
LDL Cholesterol: 98 mg/dL (ref 0–99)
NonHDL: 125.54
Total CHOL/HDL Ratio: 4
Triglycerides: 137 mg/dL (ref 0.0–149.0)
VLDL: 27.4 mg/dL (ref 0.0–40.0)

## 2023-05-07 LAB — POCT GLYCOSYLATED HEMOGLOBIN (HGB A1C): Hemoglobin A1C: 8.8 % — AB (ref 4.0–5.6)

## 2023-05-07 NOTE — Assessment & Plan Note (Signed)
Clinically stable.  Normovolemic. Has cardiology follow-up coming up soon Continues Entresto 24-26 twice a day, Lasix 40 mg daily, Jardiance 25 mg daily, Aldactone 25 mg daily

## 2023-05-07 NOTE — Assessment & Plan Note (Signed)
BP Readings from Last 3 Encounters:  05/07/23 132/84  04/10/23 (!) 162/93  01/17/23 128/76  Well-controlled hypertension Continue amlodipine 10 mg, carvedilol 25 mg twice a day, Entresto 24-26 twice a day, and Aldactone 25 mg daily Uncontrolled diabetes with hemoglobin A1c at 8.8. Needs endocrinology evaluation.  Referral placed today. Continues weekly Ozempic 0.5 mg.  Complaining of diarrhea Continue Humulin insulin 10 units twice a day Continue glipizide 5 mg daily Continue Jardiance 25 mg daily Cardiovascular risks associated with uncontrolled diabetes discussed Diet and nutrition discussed

## 2023-05-07 NOTE — Patient Instructions (Signed)
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
## 2023-05-07 NOTE — Assessment & Plan Note (Signed)
Uncontrolled diabetes with hemoglobin A1c at 8.8 Continues atorvastatin 80 mg daily Diet and nutrition discussed.

## 2023-05-08 LAB — MICROALBUMIN / CREATININE URINE RATIO
Creatinine,U: 36 mg/dL
Microalb Creat Ratio: 96.2 mg/g — ABNORMAL HIGH (ref 0.0–30.0)
Microalb, Ur: 34.6 mg/dL — ABNORMAL HIGH (ref 0.0–1.9)

## 2023-05-09 ENCOUNTER — Encounter: Payer: Self-pay | Admitting: Nurse Practitioner

## 2023-05-09 ENCOUNTER — Ambulatory Visit: Payer: Medicaid Other | Attending: Nurse Practitioner | Admitting: Nurse Practitioner

## 2023-05-09 ENCOUNTER — Ambulatory Visit: Payer: Medicaid Other | Admitting: Nurse Practitioner

## 2023-05-09 VITALS — BP 130/78 | HR 87 | Ht 62.0 in | Wt 305.0 lb

## 2023-05-09 DIAGNOSIS — I10 Essential (primary) hypertension: Secondary | ICD-10-CM | POA: Diagnosis not present

## 2023-05-09 DIAGNOSIS — R42 Dizziness and giddiness: Secondary | ICD-10-CM

## 2023-05-09 DIAGNOSIS — E785 Hyperlipidemia, unspecified: Secondary | ICD-10-CM

## 2023-05-09 DIAGNOSIS — E118 Type 2 diabetes mellitus with unspecified complications: Secondary | ICD-10-CM

## 2023-05-09 DIAGNOSIS — I5042 Chronic combined systolic (congestive) and diastolic (congestive) heart failure: Secondary | ICD-10-CM

## 2023-05-09 DIAGNOSIS — Z8673 Personal history of transient ischemic attack (TIA), and cerebral infarction without residual deficits: Secondary | ICD-10-CM

## 2023-05-09 DIAGNOSIS — Z794 Long term (current) use of insulin: Secondary | ICD-10-CM | POA: Diagnosis not present

## 2023-05-09 DIAGNOSIS — I2510 Atherosclerotic heart disease of native coronary artery without angina pectoris: Secondary | ICD-10-CM | POA: Diagnosis not present

## 2023-05-09 MED ORDER — NITROGLYCERIN 0.4 MG SL SUBL: 0.4000 mg | SUBLINGUAL_TABLET | SUBLINGUAL | 2 refills | Status: DC | PRN

## 2023-05-09 NOTE — Patient Instructions (Signed)
Medication Instructions:  Start nitroglycerin 0.4 MG as needed for chest pain. *If you need a refill on your cardiac medications before your next appointment, please call your pharmacy*   Lab Work: Fasting Lipid Panel at earliest convenience. If you have labs (blood work) drawn today and your tests are completely normal, you will receive your results only by: MyChart Message (if you have MyChart) OR A paper copy in the mail If you have any lab test that is abnormal or we need to change your treatment, we will call you to review the results.   Testing/Procedures: No test   Follow-Up: At [facility], you and your health needs are our priority.  As part of our continuing mission to provide you with exceptional heart care, we have created designated Provider Care Teams.  These Care Teams include your primary Cardiologist (physician) and Advanced Practice Providers (APPs -  Physician Assistants and Nurse Practitioners) who all work together to provide you with the care you need, when you need it.  We recommend signing up for the patient portal called "MyChart".  Sign up information is provided on this After Visit Summary.  MyChart is used to connect with patients for Virtual Visits (Telemedicine).  Patients are able to view lab/test results, encounter notes, upcoming appointments, etc.  Non-urgent messages can be sent to your provider as well.   To learn more about what you can do with MyChart, go to [email or web address].    Your next appointment:   6 month(s)  Provider:   Chrystie Nose, MD

## 2023-05-09 NOTE — Progress Notes (Signed)
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 by me today - EKG Interpretation Date/Time:  Thursday May 09 2023 15:30:45 EDT Ventricular Rate:  87 PR Interval:  136 QRS Duration:  98 QT Interval:  414 QTC Calculation: 498 R Axis:   28  Text Interpretation: Normal sinus rhythm Low voltage QRS Confirmed by Bernadene Person ([id number]) on 05/09/2023 3:56:46 PM  - no acute changes.   Lab Results  Component Value Date   WBC 8.4 05/07/2023   HGB 11.5 (L) 05/07/2023   HCT 35.6 (L) 05/07/2023   MCV 89.6 05/07/2023   PLT 379.0 05/07/2023   Lab Results  Component Value Date   CREATININE 1.18 05/07/2023   BUN 17 05/07/2023   NA 139 05/07/2023   K 4.1 05/07/2023   CL 105 05/07/2023   CO2 26 05/07/2023   Lab Results  Component Value Date   ALT 10 05/07/2023   AST 12 05/07/2023   ALKPHOS 99 05/07/2023   BILITOT 0.3 05/07/2023   Lab Results  Component Value Date   CHOL 163 05/07/2023   HDL 37.50 (L) 05/07/2023   LDLCALC 98 05/07/2023   TRIG 137.0  05/07/2023   CHOLHDL 4 05/07/2023    Lab Results  Component Value Date   HGBA1C 8.8 (A) 05/07/2023    Assessment & Plan    1. Chronic combined systolic and diastolic heart failure: Diagnosed in 2019 in the setting of NSTEMI.  Most recent echo in 02/2022 showed EF 45 to 50%, RWMA, mild asymmetric LVH of the septal segment, G2 DD, normal RV systolic function, mild mitral valve regurgitation. Of note, she underwent tubal ligation following the birth of her child in 2021; she is not breast-feeding. Euvolemic and well compensated on exam.  Continue carvedilol, Jardiance, Lasix, Entresto, and spironolactone as above.  2. CAD:  CAD s/p NSTEMI, DESx 4 (p-mLAD, Ramus, pRCA) in 2019. Myoview in January 2021 showed EF 36%, intermediate risk study with apical scar and moderate anterior apical ischemia with this scarring of anterior septum. Medical management was advised.  Recent episodes of chest discomfort while lying in bed, relieved with nitroglycerin, denies any exertional symptoms. Will give prescription for nitroglycerin. We discussed possibly increasing her Imdur.  Will continue to monitor symptoms, if she has progressive symptoms concerning for angina, consider ischemic evaluation.  Will defer for now. Continue Plavix, amlodipine,  Imdur, Lipitor and current medications as above.     3. Lightheadedness: Cardiac monitor was unremarkable. Most recent echo as above.  CT of the neck in January 2023 showed less than 50% stenosis of R ICA, not considered hemodynamically significant.  Improved with decreased Entresto.     4. Hypertension: BP well controlled. Continue  current antihypertensive regimen.   5. Hyperlipidemia: LDL was 98 on 05/07/2023.  She states she was not fasting at this time.  Will repeat fasting lipid panel.  Continue Lipitor.  6. H/o CVA: Hospitalized 08/2021 in the setting of CVA. Unknown source.  Bubble study was negative for PFO. TEE without cardiac source of embolism.  30-day event  monitor showed normal sinus rhythm, no signs of atrial fibrillation or other arrhythmia. Following with neurology.  Continue Plavix, Lipitor.  7. Type 2 diabetes/obesity: A1c was 9.0 in 01/2023. Monitored and managed per PCP.    8. Disposition: Follow-up in 6 months.     Joylene Grapes, NP 05/09/2023, 5:13 PM

## 2023-07-21 ENCOUNTER — Other Ambulatory Visit (INDEPENDENT_AMBULATORY_CARE_PROVIDER_SITE_OTHER): Payer: Self-pay | Admitting: Emergency Medicine

## 2023-07-21 ENCOUNTER — Other Ambulatory Visit: Payer: Self-pay | Admitting: Internal Medicine

## 2023-08-05 ENCOUNTER — Other Ambulatory Visit (HOSPITAL_BASED_OUTPATIENT_CLINIC_OR_DEPARTMENT_OTHER): Payer: Self-pay

## 2023-08-06 ENCOUNTER — Ambulatory Visit: Payer: Medicaid Other | Admitting: Emergency Medicine

## 2023-08-09 ENCOUNTER — Emergency Department (HOSPITAL_COMMUNITY): Payer: No Typology Code available for payment source

## 2023-08-09 ENCOUNTER — Encounter (HOSPITAL_COMMUNITY): Payer: Self-pay

## 2023-08-09 ENCOUNTER — Emergency Department (HOSPITAL_COMMUNITY)
Admission: EM | Admit: 2023-08-09 | Discharge: 2023-08-09 | Disposition: A | Discharge status: Home / Self Care | Payer: No Typology Code available for payment source

## 2023-08-09 DIAGNOSIS — Y92410 Unspecified street and highway as the place of occurrence of the external cause: Secondary | ICD-10-CM | POA: Insufficient documentation

## 2023-08-09 DIAGNOSIS — R0789 Other chest pain: Secondary | ICD-10-CM | POA: Insufficient documentation

## 2023-08-09 DIAGNOSIS — M25531 Pain in right wrist: Secondary | ICD-10-CM | POA: Diagnosis not present

## 2023-08-09 DIAGNOSIS — I10 Essential (primary) hypertension: Secondary | ICD-10-CM | POA: Diagnosis not present

## 2023-08-09 DIAGNOSIS — S299XXA Unspecified injury of thorax, initial encounter: Secondary | ICD-10-CM | POA: Diagnosis not present

## 2023-08-09 DIAGNOSIS — R079 Chest pain, unspecified: Secondary | ICD-10-CM | POA: Diagnosis not present

## 2023-08-09 LAB — CBC
HCT: 37.9 % (ref 36.0–46.0)
Hemoglobin: 12.6 g/dL (ref 12.0–15.0)
MCH: 29.3 pg (ref 26.0–34.0)
MCHC: 33.2 g/dL (ref 30.0–36.0)
MCV: 88.1 fL (ref 80.0–100.0)
Platelets: 366 10*3/uL (ref 150–400)
RBC: 4.3 MIL/uL (ref 3.87–5.11)
RDW: 13.2 % (ref 11.5–15.5)
WBC: 8 10*3/uL (ref 4.0–10.5)
nRBC: 0 % (ref 0.0–0.2)

## 2023-08-09 LAB — BASIC METABOLIC PANEL
Anion gap: 9 (ref 5–15)
BUN: 25 mg/dL — ABNORMAL HIGH (ref 6–20)
CO2: 25 mmol/L (ref 22–32)
Calcium: 8.9 mg/dL (ref 8.9–10.3)
Chloride: 102 mmol/L (ref 98–111)
Creatinine, Ser: 1.26 mg/dL — ABNORMAL HIGH (ref 0.44–1.00)
GFR, Estimated: 57 mL/min — ABNORMAL LOW (ref >60)
Glucose, Bld: 322 mg/dL — ABNORMAL HIGH (ref 70–99)
Potassium: 3.6 mmol/L (ref 3.5–5.1)
Sodium: 136 mmol/L (ref 135–145)

## 2023-08-09 LAB — TROPONIN I (HIGH SENSITIVITY)
Troponin I (High Sensitivity): 11 ng/L (ref <18)
Troponin I (High Sensitivity): 13 ng/L (ref <18)

## 2023-08-09 MED ORDER — CYCLOBENZAPRINE HCL 10 MG PO TABS: 5.0000 mg | ORAL_TABLET | Freq: Every day | ORAL | 0 refills | Status: AC
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
## 2023-08-14 ENCOUNTER — Ambulatory Visit: Payer: Medicaid Other | Admitting: Emergency Medicine

## 2023-08-19 ENCOUNTER — Ambulatory Visit: Payer: Medicaid Other | Admitting: Emergency Medicine

## 2023-08-21 ENCOUNTER — Telehealth: Payer: Self-pay | Admitting: Emergency Medicine

## 2023-08-21 ENCOUNTER — Ambulatory Visit: Payer: Medicaid Other | Admitting: Emergency Medicine

## 2023-08-21 VITALS — BP 114/76 | HR 91 | Temp 98.0°F | Wt 309.0 lb

## 2023-08-21 DIAGNOSIS — E1169 Type 2 diabetes mellitus with other specified complication: Secondary | ICD-10-CM

## 2023-08-21 DIAGNOSIS — E785 Hyperlipidemia, unspecified: Secondary | ICD-10-CM

## 2023-08-21 DIAGNOSIS — E1159 Type 2 diabetes mellitus with other circulatory complications: Secondary | ICD-10-CM

## 2023-08-21 DIAGNOSIS — I152 Hypertension secondary to endocrine disorders: Secondary | ICD-10-CM | POA: Diagnosis not present

## 2023-08-21 DIAGNOSIS — I5042 Chronic combined systolic (congestive) and diastolic (congestive) heart failure: Secondary | ICD-10-CM | POA: Diagnosis not present

## 2023-08-21 LAB — POCT GLYCOSYLATED HEMOGLOBIN (HGB A1C): Hemoglobin A1C: 10.6 % — AB (ref 4.0–5.6)

## 2023-08-21 MED ORDER — TIRZEPATIDE 5 MG/0.5ML ~~LOC~~ SOAJ
5.0000 mg | SUBCUTANEOUS | 3 refills | Status: DC
Start: 2023-08-21 — End: 2023-12-17
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
## 2023-08-21 NOTE — Assessment & Plan Note (Signed)
Clinically stable.  Normovolemic. Most recent cardiologist office visit notes reviewed Continues Entresto 24-26 twice a day, Lasix 40 mg daily, Jardiance 25 mg daily, Aldactone 25 mg daily

## 2023-08-21 NOTE — Assessment & Plan Note (Signed)
Well-controlled hypertension Continue amlodipine 10 mg, carvedilol 25 mg twice a day, Entresto 24-26 twice a day, and Aldactone 25 mg daily Uncontrolled diabetes with hemoglobin A1c of 10.6. Ozempic and Trulicity given her diarrhea Recommend trial of Mounjaro 5 mg weekly Advised to increase insulin to 15 units twice a day and continue Jardiance 25 mg daily and glipizide 5 mg daily Cardiovascular risks associated with uncontrolled diabetes discussed Diet and nutrition discussed Follow-up in 3 months

## 2023-08-21 NOTE — Assessment & Plan Note (Signed)
Uncontrolled diabetes with hemoglobin A1c at 10.6 Continues atorvastatin 80 mg daily Diet and nutrition discussed.

## 2023-08-21 NOTE — Telephone Encounter (Signed)
Send request for prior authorization please.

## 2023-08-21 NOTE — Patient Instructions (Signed)
Start weekly Mounjaro 5 mg Increase insulin to 15 units twice a day Continue Jardiance and glipizide Follow-up in 3 months  Diabetes Mellitus and Nutrition, Adult When you have diabetes, or diabetes mellitus, it is very important to have healthy eating habits because your blood sugar (glucose) levels are greatly affected by what you eat and drink. Eating healthy foods in the right amounts, at about the same times every day, can help you: Manage your blood glucose. Lower your risk of heart disease. Improve your blood pressure. Reach or maintain a healthy weight. What can affect my meal plan? Every person with diabetes is different, and each person has different needs for a meal plan. Your health care provider may recommend that you work with a dietitian to make a meal plan that is best for you. Your meal plan may vary depending on factors such as: The calories you need. The medicines you take. Your weight. Your blood glucose, blood pressure, and cholesterol levels. Your activity level. Other health conditions you have, such as heart or kidney disease. How do carbohydrates affect me? Carbohydrates, also called carbs, affect your blood glucose level more than any other type of food. Eating carbs raises the amount of glucose in your blood. It is important to know how many carbs you can safely have in each meal. This is different for every person. Your dietitian can help you calculate how many carbs you should have at each meal and for each snack. How does alcohol affect me? Alcohol can cause a decrease in blood glucose (hypoglycemia), especially if you use insulin or take certain diabetes medicines by mouth. Hypoglycemia can be a life-threatening condition. Symptoms of hypoglycemia, such as sleepiness, dizziness, and confusion, are similar to symptoms of having too much alcohol. Do not drink alcohol if: Your health care provider tells you not to drink. You are pregnant, may be pregnant, or are  planning to become pregnant. If you drink alcohol: Limit how much you have to: 0-1 drink a day for women. 0-2 drinks a day for men. Know how much alcohol is in your drink. In the [place] one drink equals one 12 oz bottle of beer (355 mL), one 5 oz glass of wine (148 mL), or one 1 oz glass of hard liquor (44 mL). Keep yourself hydrated with water, diet soda, or unsweetened iced tea. Keep in mind that regular soda, juice, and other mixers may contain a lot of sugar and must be counted as carbs. What are tips for following this plan?  Reading food labels Start by checking the serving size on the Nutrition Facts label of packaged foods and drinks. The number of calories and the amount of carbs, fats, and other nutrients listed on the label are based on one serving of the item. Many items contain more than one serving per package. Check the total grams (g) of carbs in one serving. Check the number of grams of saturated fats and trans fats in one serving. Choose foods that have a low amount or none of these fats. Check the number of milligrams (mg) of salt (sodium) in one serving. Most people should limit total sodium intake to less than 2,300 mg per day. Always check the nutrition information of foods labeled as "low-fat" or "nonfat." These foods may be higher in added sugar or refined carbs and should be avoided. Talk to your dietitian to identify your daily goals for nutrients listed on the label. Shopping Avoid buying canned, pre-made, or processed foods. These foods tend  to be high in fat, sodium, and added sugar. Shop around the outside edge of the grocery store. This is where you will most often find fresh fruits and vegetables, bulk grains, fresh meats, and fresh dairy products. Cooking Use low-heat cooking methods, such as baking, instead of high-heat cooking methods, such as deep frying. Cook using healthy oils, such as olive, canola, or sunflower oil. Avoid cooking with butter, cream, or  high-fat meats. Meal planning Eat meals and snacks regularly, preferably at the same times every day. Avoid going long periods of time without eating. Eat foods that are high in fiber, such as fresh fruits, vegetables, beans, and whole grains. Eat 4-6 oz (112-168 g) of lean protein each day, such as lean meat, chicken, fish, eggs, or tofu. One ounce (oz) (28 g) of lean protein is equal to: 1 oz (28 g) of meat, chicken, or fish. 1 egg.  cup (62 g) of tofu. Eat some foods each day that contain healthy fats, such as avocado, nuts, seeds, and fish. What foods should I eat? Fruits Berries. Apples. Oranges. Peaches. Apricots. Plums. Grapes. Mangoes. Papayas. Pomegranates. Kiwi. Cherries. Vegetables Leafy greens, including lettuce, spinach, kale, chard, collard greens, mustard greens, and cabbage. Beets. Cauliflower. Broccoli. Carrots. Green beans. Tomatoes. Peppers. Onions. Cucumbers. Brussels sprouts. Grains Whole grains, such as whole-wheat or whole-grain bread, crackers, tortillas, cereal, and pasta. Unsweetened oatmeal. Quinoa. Brown or wild rice. Meats and other proteins Seafood. Poultry without skin. Lean cuts of poultry and beef. Tofu. Nuts. Seeds. Dairy Low-fat or fat-free dairy products such as milk, yogurt, and cheese. The items listed above may not be a complete list of foods and beverages you can eat and drink. Contact a dietitian for more information. What foods should I avoid? Fruits Fruits canned with syrup. Vegetables Canned vegetables. Frozen vegetables with butter or cream sauce. Grains Refined white flour and flour products such as bread, pasta, snack foods, and cereals. Avoid all processed foods. Meats and other proteins Fatty cuts of meat. Poultry with skin. Breaded or fried meats. Processed meat. Avoid saturated fats. Dairy Full-fat yogurt, cheese, or milk. Beverages Sweetened drinks, such as soda or iced tea. The items listed above may not be a complete list of  foods and beverages you should avoid. Contact a dietitian for more information. Questions to ask a health care provider Do I need to meet with a certified diabetes care and education specialist? Do I need to meet with a dietitian? What number can I call if I have questions? When are the best times to check my blood glucose? Where to find more information: American Diabetes Association: diabetes.org Academy of Nutrition and Dietetics: eatright.[employer] of Diabetes and Digestive and Kidney Diseases: StageSync.si Association of Diabetes Care & Education Specialists: diabeteseducator.org Summary It is important to have healthy eating habits because your blood sugar (glucose) levels are greatly affected by what you eat and drink. It is important to use alcohol carefully. A healthy meal plan will help you manage your blood glucose and lower your risk of heart disease. Your health care provider may recommend that you work with a dietitian to make a meal plan that is best for you. This information is not intended to replace advice given to you by your health care provider. Make sure you discuss any questions you have with your health care provider. Document Revised: 03/23/2020 Document Reviewed: 03/23/2020 Elsevier Patient Education  2024 [employer].

## 2023-08-22 ENCOUNTER — Telehealth: Payer: Self-pay

## 2023-08-22 ENCOUNTER — Other Ambulatory Visit (HOSPITAL_COMMUNITY): Payer: Self-pay

## 2023-08-22 NOTE — Telephone Encounter (Signed)
Copied from CRM [id number]. Topic: Clinical - Medication Question >> Aug 22, 2023 10:30 AM Almira Coaster wrote: Reason for CRM: Patient received a message from [facility] regarding tirzepatide [facility]) 5 MG/0.5ML Pen that told patient they are requesting  an alternative.

## 2023-08-22 NOTE — Telephone Encounter (Signed)
Pharmacy Patient Advocate Encounter   Received notification from Pt Calls Messages that prior authorization for Mounjaro 5mg /0.34ml is required/requested.   Insurance verification completed.   The patient is insured through [facility] .   Per test claim: PA required; PA submitted to above mentioned insurance via CoverMyMeds Key/confirmation #/EOC [id number] Status is pending

## 2023-08-23 ENCOUNTER — Encounter: Payer: Self-pay | Admitting: Radiology

## 2023-08-23 NOTE — Telephone Encounter (Signed)
Pharmacy Patient Advocate Encounter  Received notification from [facility] that Prior Authorization for [facility] 5mg /0.56ml has been APPROVED from 08/22/23 to 08/21/24. Spoke to pharmacy to process.Copay is $4.00.    PA #/Case ID/Reference #: [id number]

## 2023-08-29 NOTE — Telephone Encounter (Signed)
PA has been approved and documented in separate encounter, please sign off on rx in this encounter as PA team is unable to resolve RX requests. Thank you

## 2023-09-13 ENCOUNTER — Other Ambulatory Visit: Payer: Self-pay | Admitting: Nurse Practitioner

## 2023-10-01 ENCOUNTER — Other Ambulatory Visit: Payer: Self-pay | Admitting: Emergency Medicine

## 2023-10-01 DIAGNOSIS — I429 Cardiomyopathy, unspecified: Secondary | ICD-10-CM

## 2023-11-06 ENCOUNTER — Observation Stay (HOSPITAL_BASED_OUTPATIENT_CLINIC_OR_DEPARTMENT_OTHER)
Admission: EM | Admit: 2023-11-06 | Discharge: 2023-11-08 | Disposition: A | Discharge status: Home / Self Care | Attending: Internal Medicine | Admitting: Internal Medicine

## 2023-11-06 ENCOUNTER — Emergency Department (HOSPITAL_BASED_OUTPATIENT_CLINIC_OR_DEPARTMENT_OTHER)

## 2023-11-06 ENCOUNTER — Other Ambulatory Visit: Payer: Self-pay

## 2023-11-06 ENCOUNTER — Encounter (HOSPITAL_BASED_OUTPATIENT_CLINIC_OR_DEPARTMENT_OTHER): Payer: Self-pay

## 2023-11-06 DIAGNOSIS — Z79899 Other long term (current) drug therapy: Secondary | ICD-10-CM | POA: Diagnosis not present

## 2023-11-06 DIAGNOSIS — I5042 Chronic combined systolic (congestive) and diastolic (congestive) heart failure: Secondary | ICD-10-CM | POA: Diagnosis present

## 2023-11-06 DIAGNOSIS — E1122 Type 2 diabetes mellitus with diabetic chronic kidney disease: Secondary | ICD-10-CM | POA: Insufficient documentation

## 2023-11-06 DIAGNOSIS — I2510 Atherosclerotic heart disease of native coronary artery without angina pectoris: Secondary | ICD-10-CM | POA: Diagnosis not present

## 2023-11-06 DIAGNOSIS — Z8673 Personal history of transient ischemic attack (TIA), and cerebral infarction without residual deficits: Secondary | ICD-10-CM | POA: Diagnosis not present

## 2023-11-06 DIAGNOSIS — E785 Hyperlipidemia, unspecified: Secondary | ICD-10-CM | POA: Diagnosis not present

## 2023-11-06 DIAGNOSIS — I5022 Chronic systolic (congestive) heart failure: Secondary | ICD-10-CM | POA: Diagnosis not present

## 2023-11-06 DIAGNOSIS — E1169 Type 2 diabetes mellitus with other specified complication: Secondary | ICD-10-CM | POA: Diagnosis not present

## 2023-11-06 DIAGNOSIS — E1165 Type 2 diabetes mellitus with hyperglycemia: Secondary | ICD-10-CM | POA: Insufficient documentation

## 2023-11-06 DIAGNOSIS — Z9861 Coronary angioplasty status: Secondary | ICD-10-CM | POA: Insufficient documentation

## 2023-11-06 DIAGNOSIS — Z794 Long term (current) use of insulin: Secondary | ICD-10-CM | POA: Insufficient documentation

## 2023-11-06 DIAGNOSIS — I130 Hypertensive heart and chronic kidney disease with heart failure and stage 1 through stage 4 chronic kidney disease, or unspecified chronic kidney disease: Secondary | ICD-10-CM | POA: Insufficient documentation

## 2023-11-06 DIAGNOSIS — I214 Non-ST elevation (NSTEMI) myocardial infarction: Principal | ICD-10-CM

## 2023-11-06 DIAGNOSIS — N182 Chronic kidney disease, stage 2 (mild): Secondary | ICD-10-CM | POA: Insufficient documentation

## 2023-11-06 DIAGNOSIS — I429 Cardiomyopathy, unspecified: Secondary | ICD-10-CM

## 2023-11-06 DIAGNOSIS — E119 Type 2 diabetes mellitus without complications: Secondary | ICD-10-CM

## 2023-11-06 DIAGNOSIS — R140 Abdominal distension (gaseous): Secondary | ICD-10-CM | POA: Diagnosis not present

## 2023-11-06 DIAGNOSIS — I517 Cardiomegaly: Secondary | ICD-10-CM | POA: Diagnosis not present

## 2023-11-06 DIAGNOSIS — Z1152 Encounter for screening for COVID-19: Secondary | ICD-10-CM | POA: Diagnosis not present

## 2023-11-06 DIAGNOSIS — E1159 Type 2 diabetes mellitus with other circulatory complications: Secondary | ICD-10-CM | POA: Diagnosis present

## 2023-11-06 DIAGNOSIS — E66813 Obesity, class 3: Secondary | ICD-10-CM | POA: Diagnosis not present

## 2023-11-06 DIAGNOSIS — R110 Nausea: Secondary | ICD-10-CM | POA: Diagnosis not present

## 2023-11-06 DIAGNOSIS — R079 Chest pain, unspecified: Secondary | ICD-10-CM | POA: Diagnosis not present

## 2023-11-06 DIAGNOSIS — R0789 Other chest pain: Secondary | ICD-10-CM | POA: Diagnosis not present

## 2023-11-06 DIAGNOSIS — Z6841 Body Mass Index (BMI) 40.0 and over, adult: Secondary | ICD-10-CM | POA: Insufficient documentation

## 2023-11-06 DIAGNOSIS — I25118 Atherosclerotic heart disease of native coronary artery with other forms of angina pectoris: Secondary | ICD-10-CM

## 2023-11-06 LAB — HEPATIC FUNCTION PANEL
ALT: 13 U/L (ref 0–44)
AST: 14 U/L — ABNORMAL LOW (ref 15–41)
Albumin: 2.5 g/dL — ABNORMAL LOW (ref 3.5–5.0)
Alkaline Phosphatase: 88 U/L (ref 38–126)
Bilirubin, Direct: <0.1 mg/dL (ref 0.0–0.2)
Total Bilirubin: 0.6 mg/dL (ref 0.0–1.2)
Total Protein: 6.3 g/dL — ABNORMAL LOW (ref 6.5–8.1)

## 2023-11-06 LAB — CBC
HCT: 37.3 % (ref 36.0–46.0)
Hemoglobin: 12.4 g/dL (ref 12.0–15.0)
MCH: 29.1 pg (ref 26.0–34.0)
MCHC: 33.2 g/dL (ref 30.0–36.0)
MCV: 87.6 fL (ref 80.0–100.0)
Platelets: 355 10*3/uL (ref 150–400)
RBC: 4.26 MIL/uL (ref 3.87–5.11)
RDW: 13.1 % (ref 11.5–15.5)
WBC: 12.5 10*3/uL — ABNORMAL HIGH (ref 4.0–10.5)
nRBC: 0 % (ref 0.0–0.2)

## 2023-11-06 LAB — BASIC METABOLIC PANEL
Anion gap: 9 (ref 5–15)
BUN: 20 mg/dL (ref 6–20)
CO2: 24 mmol/L (ref 22–32)
Calcium: 8.5 mg/dL — ABNORMAL LOW (ref 8.9–10.3)
Chloride: 103 mmol/L (ref 98–111)
Creatinine, Ser: 1.18 mg/dL — ABNORMAL HIGH (ref 0.44–1.00)
GFR, Estimated: >60 mL/min (ref >60)
Glucose, Bld: 295 mg/dL — ABNORMAL HIGH (ref 70–99)
Potassium: 4 mmol/L (ref 3.5–5.1)
Sodium: 136 mmol/L (ref 135–145)

## 2023-11-06 LAB — RESP PANEL BY RT-PCR (RSV, FLU A&B, COVID)  RVPGX2
Influenza A by PCR: NEGATIVE
Influenza B by PCR: NEGATIVE
Resp Syncytial Virus by PCR: NEGATIVE
SARS Coronavirus 2 by RT PCR: NEGATIVE

## 2023-11-06 LAB — CBG MONITORING, ED
Glucose-Capillary: 218 mg/dL — ABNORMAL HIGH (ref 70–99)
Glucose-Capillary: 195 mg/dL — ABNORMAL HIGH (ref 70–99)
Glucose-Capillary: 311 mg/dL — ABNORMAL HIGH (ref 70–99)

## 2023-11-06 LAB — HEMOGLOBIN A1C
Hgb A1c MFr Bld: 9.9 % — ABNORMAL HIGH (ref 4.8–5.6)
Mean Plasma Glucose: 237.43 mg/dL

## 2023-11-06 LAB — PREGNANCY, URINE: Preg Test, Ur: NEGATIVE

## 2023-11-06 LAB — TROPONIN I (HIGH SENSITIVITY)
Troponin I (High Sensitivity): 84 ng/L — ABNORMAL HIGH (ref <18)
Troponin I (High Sensitivity): 92 ng/L — ABNORMAL HIGH (ref <18)
Troponin I (High Sensitivity): 103 ng/L (ref <18)

## 2023-11-06 LAB — GLUCOSE, CAPILLARY: Glucose-Capillary: 158 mg/dL — ABNORMAL HIGH (ref 70–99)

## 2023-11-06 LAB — BRAIN NATRIURETIC PEPTIDE: B Natriuretic Peptide: 174.7 pg/mL — ABNORMAL HIGH (ref 0.0–100.0)

## 2023-11-06 LAB — TSH: TSH: 4.486 u[IU]/mL (ref 0.350–4.500)

## 2023-11-06 MED ORDER — NITROGLYCERIN 0.4 MG/HR TD PT24
0.4000 mg | MEDICATED_PATCH | Freq: Every day | TRANSDERMAL | Status: DC
  Administered 2023-11-06 – 2023-11-07 (×2): 0.4 mg via TRANSDERMAL
  Filled 2023-11-06 (×2): qty 1

## 2023-11-06 MED ORDER — AMLODIPINE BESYLATE 10 MG PO TABS
10.0000 mg | ORAL_TABLET | Freq: Every day | ORAL | Status: DC
  Administered 2023-11-06 – 2023-11-08 (×3): 10 mg via ORAL
  Filled 2023-11-06: qty 1
  Filled 2023-11-06: qty 2
  Filled 2023-11-06: qty 1

## 2023-11-06 MED ORDER — INSULIN ASPART 100 UNIT/ML IJ SOLN: 0.0000 [IU] | Freq: Every day | INTRAMUSCULAR | Status: DC

## 2023-11-06 MED ORDER — PANTOPRAZOLE SODIUM 40 MG IV SOLR
40.0000 mg | Freq: Two times a day (BID) | INTRAVENOUS | Status: DC
  Administered 2023-11-06: 40 mg via INTRAVENOUS
  Filled 2023-11-06: qty 10

## 2023-11-06 MED ORDER — NITROGLYCERIN 0.4 MG SL SUBL
SUBLINGUAL_TABLET | SUBLINGUAL | Status: AC
  Filled 2023-11-06: qty 1

## 2023-11-06 MED ORDER — SPIRONOLACTONE 25 MG PO TABS
25.0000 mg | ORAL_TABLET | Freq: Every day | ORAL | Status: DC
  Administered 2023-11-06 – 2023-11-08 (×3): 25 mg via ORAL
  Filled 2023-11-06 (×2): qty 1
  Filled 2023-11-06: qty 2

## 2023-11-06 MED ORDER — NITROGLYCERIN 0.4 MG SL SUBL: 0.4000 mg | SUBLINGUAL_TABLET | SUBLINGUAL | Status: DC | PRN

## 2023-11-06 MED ORDER — HEPARIN BOLUS VIA INFUSION
4000.0000 [IU] | Freq: Once | INTRAVENOUS | Status: AC
  Administered 2023-11-06: 4000 [IU] via INTRAVENOUS
  Filled 2023-11-06: qty 4000

## 2023-11-06 MED ORDER — INSULIN GLARGINE 100 UNIT/ML ~~LOC~~ SOLN
15.0000 [IU] | Freq: Every day | SUBCUTANEOUS | Status: DC
  Filled 2023-11-06: qty 0.15

## 2023-11-06 MED ORDER — SACUBITRIL-VALSARTAN 24-26 MG PO TABS
1.0000 | ORAL_TABLET | Freq: Two times a day (BID) | ORAL | Status: DC
  Administered 2023-11-06 – 2023-11-08 (×5): 1 via ORAL
  Filled 2023-11-06 (×6): qty 1

## 2023-11-06 MED ORDER — ACETAMINOPHEN 325 MG PO TABS: 650.0000 mg | ORAL_TABLET | ORAL | Status: DC | PRN

## 2023-11-06 MED ORDER — HEPARIN (PORCINE) 25000 UT/250ML-% IV SOLN
1450.0000 [IU]/h | INTRAVENOUS | Status: DC
  Administered 2023-11-06: 1100 [IU]/h via INTRAVENOUS
  Administered 2023-11-07: 1400 [IU]/h via INTRAVENOUS
  Administered 2023-11-08: 1450 [IU]/h via INTRAVENOUS
  Filled 2023-11-06 (×3): qty 250

## 2023-11-06 MED ORDER — ASPIRIN 81 MG PO CHEW
324.0000 mg | CHEWABLE_TABLET | Freq: Once | ORAL | Status: AC
  Administered 2023-11-06: 324 mg via ORAL
  Filled 2023-11-06: qty 4

## 2023-11-06 MED ORDER — CLOPIDOGREL BISULFATE 75 MG PO TABS
75.0000 mg | ORAL_TABLET | Freq: Every day | ORAL | Status: DC
  Administered 2023-11-06 – 2023-11-08 (×3): 75 mg via ORAL
  Filled 2023-11-06 (×3): qty 1

## 2023-11-06 MED ORDER — FUROSEMIDE 40 MG PO TABS
40.0000 mg | ORAL_TABLET | Freq: Two times a day (BID) | ORAL | Status: DC
  Administered 2023-11-06 – 2023-11-08 (×5): 40 mg via ORAL
  Filled 2023-11-06 (×5): qty 1

## 2023-11-06 MED ORDER — ONDANSETRON HCL 4 MG/2ML IJ SOLN: 4.0000 mg | Freq: Four times a day (QID) | INTRAMUSCULAR | Status: DC | PRN

## 2023-11-06 MED ORDER — INSULIN GLARGINE 100 UNIT/ML ~~LOC~~ SOLN: 8.0000 [IU] | Freq: Every day | SUBCUTANEOUS | Status: DC

## 2023-11-06 MED ORDER — INSULIN ASPART 100 UNIT/ML IJ SOLN
0.0000 [IU] | Freq: Three times a day (TID) | INTRAMUSCULAR | Status: DC
  Administered 2023-11-06: 11 [IU] via SUBCUTANEOUS
  Administered 2023-11-06: 3 [IU] via SUBCUTANEOUS
  Administered 2023-11-06: 5 [IU] via SUBCUTANEOUS
  Administered 2023-11-07: 8 [IU] via SUBCUTANEOUS
  Administered 2023-11-07: 5 [IU] via SUBCUTANEOUS
  Administered 2023-11-07: 3 [IU] via SUBCUTANEOUS
  Administered 2023-11-08: 11 [IU] via SUBCUTANEOUS

## 2023-11-06 MED ORDER — CARVEDILOL 25 MG PO TABS
25.0000 mg | ORAL_TABLET | Freq: Two times a day (BID) | ORAL | Status: DC
  Administered 2023-11-06 – 2023-11-07 (×3): 25 mg via ORAL
  Filled 2023-11-06 (×2): qty 2
  Filled 2023-11-06 (×2): qty 1

## 2023-11-06 MED ORDER — MORPHINE SULFATE (PF) 2 MG/ML IV SOLN: 1.0000 mg | INTRAVENOUS | Status: DC | PRN

## 2023-11-06 MED ORDER — INSULIN GLARGINE-YFGN 100 UNIT/ML ~~LOC~~ SOLN: 15.0000 [IU] | Freq: Every day | SUBCUTANEOUS | Status: DC

## 2023-11-06 MED ORDER — INSULIN GLARGINE 100 UNIT/ML ~~LOC~~ SOLN
8.0000 [IU] | Freq: Every day | SUBCUTANEOUS | Status: DC
  Administered 2023-11-07 (×2): 8 [IU] via SUBCUTANEOUS
  Filled 2023-11-06 (×3): qty 0.08

## 2023-11-06 MED ORDER — NITROGLYCERIN 0.4 MG SL SUBL
0.4000 mg | SUBLINGUAL_TABLET | SUBLINGUAL | Status: DC | PRN
  Administered 2023-11-06: 0.4 mg via SUBLINGUAL

## 2023-11-06 MED ORDER — ATORVASTATIN CALCIUM 80 MG PO TABS
80.0000 mg | ORAL_TABLET | Freq: Every day | ORAL | Status: DC
  Administered 2023-11-06 – 2023-11-08 (×3): 80 mg via ORAL
  Filled 2023-11-06 (×3): qty 1

## 2023-11-06 MED ORDER — ISOSORBIDE MONONITRATE ER 30 MG PO TB24
30.0000 mg | ORAL_TABLET | Freq: Every day | ORAL | Status: DC
  Administered 2023-11-06: 30 mg via ORAL
  Filled 2023-11-06 (×2): qty 1

## 2023-11-06 NOTE — ED Provider Notes (Signed)
 [facility] AT [facility] Provider Note   CSN: [id number] Arrival date & time: 11/06/23  4010     History  Chief Complaint  Patient presents with   Chest Pain    Lauren Delgado is a 38 y.o. female.  38 yo F with a chief complaint of chest pain.  Sharp midsternal she says nothing seems make it better or worse.  Has had a little bit of a cough today.  No fevers.  No trauma.  History of MI in the past.  Not sure if this feels similar or not.   Chest Pain      Home Medications Prior to Admission medications   Medication Sig Start Date End Date Taking? Authorizing Provider  amLODipine (NORVASC) 10 MG tablet TAKE 1 TABLET BY MOUTH EVERY DAY 12/20/22   Joylene Grapes, NP  atorvastatin (LIPITOR) 80 MG tablet TAKE 1 TABLET BY MOUTH EVERY DAY 07/23/23   [name], Lisette Abu, MD  BD PEN NEEDLE NANO 2ND GEN 32G X 4 MM MISC USE TO INJECT INSULIN UP TO 4 TIMES DAILY AS NEEDED. 04/19/22   Georgina Quint, MD  carvedilol (COREG) 25 MG tablet Take 1 tablet (25 mg total) by mouth 2 (two) times daily with a meal. 04/03/22 04/12/24  Georgina Quint, MD  clopidogrel (PLAVIX) 75 MG tablet TAKE 1 TABLET BY MOUTH EVERY DAY 07/23/23   [name], Lisette Abu, MD  empagliflozin (JARDIANCE) 25 MG TABS tablet Take 1 tablet (25 mg total) by mouth daily before breakfast. 01/17/23   Georgina Quint, MD  furosemide (LASIX) 40 MG tablet Take 40 mg by mouth 2 (two) times daily.    [provider]  glipiZIDE (GLUCOTROL XL) 5 MG 24 hr tablet TAKE 1 TABLET BY MOUTH EVERY DAY WITH BREAKFAST 07/21/23   Sagardia, Eilleen Kempf, MD  insulin isophane & regular human KwikPen (HUMULIN 70/30 KWIKPEN) (70-30) 100 UNIT/ML KwikPen INJECT 10 UNITS EVERY MORNING AND EVENING. 07/18/22   Georgina Quint, MD  isosorbide mononitrate (IMDUR) 30 MG 24 hr tablet TAKE 1 TABLET BY MOUTH EVERY DAY 10/01/23   Georgina Quint, MD  levonorgestrel (MIRENA, 52 MG,) 20 MCG/DAY IUD Take 1  device by intrauterine route as directed. 11/11/14   [provider]  nitroGLYCERIN (NITROSTAT) 0.4 MG SL tablet Place 1 tablet (0.4 mg total) under the tongue every 5 (five) minutes as needed for chest pain. 05/09/23 08/07/23  Joylene Grapes, NP  sacubitril-valsartan (ENTRESTO) 24-26 MG Take 1 tablet by mouth 2 (two) times daily. 08/13/22   Joylene Grapes, NP  spironolactone (ALDACTONE) 25 MG tablet TAKE 1 TABLET (25 MG TOTAL) BY MOUTH DAILY. 09/16/23   Joylene Grapes, NP  tirzepatide [facility]) 5 MG/0.5ML Pen Inject 5 mg into the skin once a week. 08/21/23   Georgina Quint, MD  valACYclovir (VALTREX) 500 MG tablet TAKE 1 TABLET BY MOUTH TWICE A DAY 06/12/22   Georgina Quint, MD      Allergies    Metformin and related and Ozempic (0.25 or 0.5 mg-dose) [semaglutide(0.25 or 0.5mg -dos)]    Review of Systems   Review of Systems  Cardiovascular:  Positive for chest pain.    Physical Exam Updated Vital Signs BP (!) 172/112   Pulse 91   Temp 98.7 F (37.1 C) (Oral)   Resp 17   LMP 11/04/2023   SpO2 100%  Physical Exam Vitals and nursing note reviewed.  Constitutional:      General: She  is not in acute distress.    Appearance: She is well-developed. She is not diaphoretic.  HENT:     Head: Normocephalic and atraumatic.  Eyes:     Pupils: Pupils are equal, round, and reactive to light.  Cardiovascular:     Rate and Rhythm: Normal rate and regular rhythm.     Heart sounds: No murmur heard.    No friction rub. No gallop.  Pulmonary:     Effort: Pulmonary effort is normal.     Breath sounds: No wheezing or rales.  Chest:     Chest wall: Tenderness present.     Comments: Palpation of the anterior chest wall reproduces the patient's symptoms Abdominal:     General: There is no distension.     Palpations: Abdomen is soft.     Tenderness: There is no abdominal tenderness.  Musculoskeletal:        General: No tenderness.     Cervical back: Normal range of motion  and neck supple.  Skin:    General: Skin is warm and dry.  Neurological:     Mental Status: She is alert and oriented to person, place, and time.  Psychiatric:        Behavior: Behavior normal.     ED Results / Procedures / Treatments   Labs (all labs ordered are listed, but only abnormal results are displayed) Labs Reviewed  CBC - Abnormal; Notable for the following components:      Result Value   WBC 12.5 (*)    All other components within normal limits  BASIC METABOLIC PANEL - Abnormal; Notable for the following components:   Glucose, Bld 295 (*)    Creatinine, Ser 1.18 (*)    Calcium 8.5 (*)    All other components within normal limits  BRAIN NATRIURETIC PEPTIDE - Abnormal; Notable for the following components:   B Natriuretic Peptide 174.7 (*)    All other components within normal limits  TROPONIN I (HIGH SENSITIVITY) - Abnormal; Notable for the following components:   Troponin I (High Sensitivity) 84 (*)    All other components within normal limits  TROPONIN I (HIGH SENSITIVITY) - Abnormal; Notable for the following components:   Troponin I (High Sensitivity) 92 (*)    All other components within normal limits  PREGNANCY, URINE    EKG EKG Interpretation Date/Time:  Wednesday November 06 2023 04:43:02 EST Ventricular Rate:  105 PR Interval:  143 QRS Duration:  106 QT Interval:  389 QTC Calculation: 515 R Axis:   67  Text Interpretation: Sinus tachycardia Anteroseptal infarct, old QT prolonged No significant change since last tracing Confirmed by Melene Plan [phone]) on 11/06/2023 4:49:58 AM  Radiology DG Chest Port 1 View Result Date: 11/06/2023 CLINICAL DATA:  38 year old female with chest pain and shortness of breath. Nausea. EXAM: PORTABLE CHEST 1 VIEW COMPARISON:  Chest radiographs 08/09/2023 and earlier. FINDINGS: Portable AP view at 0502 hours. Stable mild cardiomegaly. Other mediastinal contours are within normal limits. Visualized tracheal air column is within  normal limits. 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 And much of this is seen to be reticulonodular calcification on a 2023 CT. No superimposed pneumothorax, pleural effusion, confluent opacity. No acute osseous abnormality identified. Paucity of bowel gas. IMPRESSION: 1. 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 2.  No acute cardiopulmonary abnormality. Electronically Signed   By: Odessa Fleming M.D.   On: 11/06/2023 05:44    Procedures Procedures  Medications Ordered in ED Medications  amLODipine (NORVASC) tablet 10 mg (has no administration in time range)  carvedilol (COREG) tablet 25 mg (has no administration in time range)  furosemide (LASIX) tablet 40 mg (has no administration in time range)  isosorbide mononitrate (IMDUR) 24 hr tablet 30 mg (has no administration in time range)  sacubitril-valsartan (ENTRESTO) 24-26 mg per tablet (has no administration in time range)  spironolactone (ALDACTONE) tablet 25 mg (has no administration in time range)  aspirin chewable tablet 324 mg (324 mg Oral Given 11/06/23 0600)    ED Course/ Medical Decision Making/ A&P                                 Medical Decision Making Amount and/or Complexity of Data Reviewed Labs: ordered. Radiology: ordered.  Risk OTC drugs. Prescription drug management.   38 yo F with the chief complaints of chest pain.  Atypical in nature and reproduced on exam.  Patient does have a history of an MI about 6 years ago.  She does not think this feels the same but she is not sure.  Will obtain blood work.  Chest x-ray.  Chest x-ray independently interpreted by me with enlarged heart size, question edema.  Radiology read with chronic changes without obvious change.  Troponin is elevated at 85.  With history of MI will discuss with cardiology.  I discussed the case with Dr. Tresa Endo.  He recommended attempting to lower her blood pressure  and rechecking the troponin.  Second troponin is slightly higher at 92.  I think likely she would benefit from at least obs and serial troponins.  Will discuss with medicine.   The patients results and plan were reviewed and discussed.   Any x-rays performed were independently reviewed by myself.   Differential diagnosis were considered with the presenting HPI.  Medications  amLODipine (NORVASC) tablet 10 mg (has no administration in time range)  carvedilol (COREG) tablet 25 mg (has no administration in time range)  furosemide (LASIX) tablet 40 mg (has no administration in time range)  isosorbide mononitrate (IMDUR) 24 hr tablet 30 mg (has no administration in time range)  sacubitril-valsartan (ENTRESTO) 24-26 mg per tablet (has no administration in time range)  spironolactone (ALDACTONE) tablet 25 mg (has no administration in time range)  aspirin chewable tablet 324 mg (324 mg Oral Given 11/06/23 0600)    Vitals:   11/06/23 0515 11/06/23 0530 11/06/23 0600 11/06/23 0630  BP: (!) 167/91 (!) 175/101 (!) 174/107 (!) 172/112  Pulse: 97 90 97 91  Resp: (!) 28 (!) 29 (!) 26 17  Temp:      TempSrc:      SpO2: 96% 96% 99% 100%    Final diagnoses:  Chest pain with high risk of acute coronary syndrome    Admission/ observation were discussed with the admitting physician, patient and/or family and they are comfortable with the plan.         Final Clinical Impression(s) / ED Diagnoses Final diagnoses:  Chest pain with high risk of acute coronary syndrome    Rx / DC Orders ED Discharge Orders     None         Melene Plan, DO 11/06/23 3557
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
## 2023-11-06 NOTE — ED Notes (Signed)
 My observation this shift, is that pt. Only drifts her SPO2 down while sleeping. She has it off now that she is up and around.

## 2023-11-06 NOTE — ED Triage Notes (Signed)
 Pt reports she is here today due to chest pain and sob. Pt reports it started x2 days. Pt also reports nausea with the chest pain. Pt reports extensive cardiac h/o with CHF, MI . Pt also reports h/o stroke.

## 2023-11-06 NOTE — ED Notes (Signed)
 CBG 311
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
## 2023-11-06 NOTE — ED Provider Notes (Signed)
  Physical Exam  BP (!) 168/106   Pulse 82   Temp 98.4 F (36.9 C) (Oral)   Resp (!) 24   LMP 11/04/2023   SpO2 100%   Physical Exam Vitals and nursing note reviewed.  HENT:     Head: Normocephalic and atraumatic.  Eyes:     Pupils: Pupils are equal, round, and reactive to light.  Cardiovascular:     Rate and Rhythm: Normal rate and regular rhythm.  Pulmonary:     Effort: Pulmonary effort is normal.     Breath sounds: Normal breath sounds.  Abdominal:     Palpations: Abdomen is soft.     Tenderness: There is no abdominal tenderness.  Skin:    General: Skin is warm and dry.  Neurological:     Mental Status: She is alert.  Psychiatric:        Mood and Affect: Mood normal.     Procedures  Procedures  ED Course / MDM   Clinical Course as of 11/06/23 0848  Wed Nov 06, 2023  0848 Discussed with admitting hospitalist who accepts patient for admission.  Patient remains chest pain-free at this time [MP]    Clinical Course User Index [MP] Royanne Foots, DO   Medical Decision Making   I, Estelle June DO, have assumed care of this patient from the previous provider pending continued monitoring on telemetry and admission.  In brief this is a 38 year old female presenting for chest pain with significant cardiac risk factors including CAD, ACS status post cath and stent, heart failure, hypertension hyperlipidemia and medication noncompliance.  Uptrending troponin.  Heart score as detailed below  HEART Score for Major Cardiac Events from [email or web address]  on 11/06/2023 ** All calculations should be rechecked by clinician prior to use **  RESULT SUMMARY: 4 points Moderate Score (4-6 points)  Risk of MACE of 12-16.6%.  If troponin is positive, many experts recommend further workup and admission even with a low HEART Score.   INPUTS: History -> 1 = Moderately suspicious EKG -> 0 = Normal Age -> 0 = <45 Risk factors -> 2 = >=3 risk factors or history of atherosclerotic  disease Initial troponin -> 1 = 1-3 normal limit   Amount and/or Complexity of Data Reviewed Labs: ordered. Radiology: ordered.  Risk OTC drugs. Prescription drug management. Decision regarding hospitalization.          Royanne Foots, DO 11/06/23 [phone]

## 2023-11-06 NOTE — Plan of Care (Signed)

## 2023-11-06 NOTE — ED Notes (Signed)
 She is taken to [facility] at this time with [facility]. Report given to Wes, RN with [facility].

## 2023-11-06 NOTE — Progress Notes (Signed)
 PHARMACY ROUNDING NOTE  Lauren Delgado is a 38 y.o. female awaiting admission. A chart review was completed to evaluate prior to admission medications, antibiotic therapy and labs/vitals. The following interventions were made:  Start sliding scale insulin  This was discussed with the ED or admitting provider and/or nurse/paramedic.   Lysle Pearl, PharmD, BCPS, BCEMP Clinical Pharmacist Please see AMION for all pharmacy numbers 11/06/2023 9:08 AM

## 2023-11-06 NOTE — Plan of Care (Signed)
 [facility] transfer Lauren Delgado is a and 38 year old female with extensive cardiac history including HTN, HLD, CAD s/p NSTEMI with DES x 4, combined systolic and diastolic CHF, CVA, DM, and obesity who presented with complaints of chest pain.  Labs significant for high-sensitivity troponin 84->92, BNP 174.7, WBC 12.5 BUN 20, creatinine 1.18, and glucose 295.  He has been discussed with nephrology recommended trending troponin.  She had been given full dose aspirin.  Noted to have moderate heart score. Accepted as observation to a cardiac telemetry bed.

## 2023-11-06 NOTE — ED Notes (Signed)
 Called Lauren Delgado at [facility] for transport
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
## 2023-11-07 ENCOUNTER — Observation Stay (HOSPITAL_COMMUNITY)

## 2023-11-07 DIAGNOSIS — I5042 Chronic combined systolic (congestive) and diastolic (congestive) heart failure: Secondary | ICD-10-CM

## 2023-11-07 DIAGNOSIS — E1169 Type 2 diabetes mellitus with other specified complication: Secondary | ICD-10-CM | POA: Diagnosis not present

## 2023-11-07 DIAGNOSIS — R079 Chest pain, unspecified: Secondary | ICD-10-CM

## 2023-11-07 DIAGNOSIS — I214 Non-ST elevation (NSTEMI) myocardial infarction: Secondary | ICD-10-CM | POA: Diagnosis not present

## 2023-11-07 DIAGNOSIS — I152 Hypertension secondary to endocrine disorders: Secondary | ICD-10-CM

## 2023-11-07 DIAGNOSIS — Z9861 Coronary angioplasty status: Secondary | ICD-10-CM | POA: Diagnosis not present

## 2023-11-07 DIAGNOSIS — Z8673 Personal history of transient ischemic attack (TIA), and cerebral infarction without residual deficits: Secondary | ICD-10-CM | POA: Diagnosis not present

## 2023-11-07 DIAGNOSIS — E119 Type 2 diabetes mellitus without complications: Secondary | ICD-10-CM

## 2023-11-07 DIAGNOSIS — I2510 Atherosclerotic heart disease of native coronary artery without angina pectoris: Secondary | ICD-10-CM

## 2023-11-07 DIAGNOSIS — E1159 Type 2 diabetes mellitus with other circulatory complications: Secondary | ICD-10-CM | POA: Diagnosis not present

## 2023-11-07 DIAGNOSIS — E785 Hyperlipidemia, unspecified: Secondary | ICD-10-CM | POA: Diagnosis not present

## 2023-11-07 DIAGNOSIS — R7989 Other specified abnormal findings of blood chemistry: Secondary | ICD-10-CM | POA: Diagnosis not present

## 2023-11-07 LAB — TROPONIN I (HIGH SENSITIVITY): Troponin I (High Sensitivity): 77 ng/L — ABNORMAL HIGH (ref <18)

## 2023-11-07 LAB — GLUCOSE, CAPILLARY
Glucose-Capillary: 220 mg/dL — ABNORMAL HIGH (ref 70–99)
Glucose-Capillary: 190 mg/dL — ABNORMAL HIGH (ref 70–99)
Glucose-Capillary: 259 mg/dL — ABNORMAL HIGH (ref 70–99)
Glucose-Capillary: 334 mg/dL — ABNORMAL HIGH (ref 70–99)

## 2023-11-07 LAB — ECHOCARDIOGRAM COMPLETE
Area-P 1/2: 3.34 cm2
Est EF: 45
Height: 62 in
S' Lateral: 4 cm
Weight: 4819.2 [oz_av]

## 2023-11-07 LAB — RAPID URINE DRUG SCREEN, HOSP PERFORMED
Amphetamines: NOT DETECTED
Barbiturates: NOT DETECTED
Benzodiazepines: NOT DETECTED
Cocaine: NOT DETECTED
Opiates: NOT DETECTED
Tetrahydrocannabinol: NOT DETECTED

## 2023-11-07 LAB — LIPID PANEL
Cholesterol: 177 mg/dL (ref 0–200)
HDL: 43 mg/dL (ref >40)
LDL Cholesterol: 109 mg/dL — ABNORMAL HIGH (ref 0–99)
Total CHOL/HDL Ratio: 4.1 ratio
Triglycerides: 125 mg/dL (ref <150)
VLDL: 25 mg/dL (ref 0–40)

## 2023-11-07 LAB — BASIC METABOLIC PANEL
Anion gap: 9 (ref 5–15)
BUN: 22 mg/dL — ABNORMAL HIGH (ref 6–20)
CO2: 24 mmol/L (ref 22–32)
Calcium: 8.7 mg/dL — ABNORMAL LOW (ref 8.9–10.3)
Chloride: 102 mmol/L (ref 98–111)
Creatinine, Ser: 1.23 mg/dL — ABNORMAL HIGH (ref 0.44–1.00)
GFR, Estimated: 58 mL/min — ABNORMAL LOW (ref >60)
Glucose, Bld: 287 mg/dL — ABNORMAL HIGH (ref 70–99)
Potassium: 3.6 mmol/L (ref 3.5–5.1)
Sodium: 135 mmol/L (ref 135–145)

## 2023-11-07 LAB — HEPARIN LEVEL (UNFRACTIONATED)
Heparin Unfractionated: 0.19 [IU]/mL — ABNORMAL LOW (ref 0.30–0.70)
Heparin Unfractionated: 0.52 [IU]/mL (ref 0.30–0.70)
Heparin Unfractionated: 0.39 [IU]/mL (ref 0.30–0.70)

## 2023-11-07 MED ORDER — EZETIMIBE 10 MG PO TABS
10.0000 mg | ORAL_TABLET | Freq: Every day | ORAL | Status: DC
  Administered 2023-11-07 – 2023-11-08 (×2): 10 mg via ORAL
  Filled 2023-11-07 (×2): qty 1

## 2023-11-07 MED ORDER — ORAL CARE MOUTH RINSE: 15.0000 mL | OROMUCOSAL | Status: DC | PRN

## 2023-11-07 MED ORDER — HEPARIN BOLUS VIA INFUSION
3000.0000 [IU] | Freq: Once | INTRAVENOUS | Status: AC
  Administered 2023-11-07: 3000 [IU] via INTRAVENOUS
  Filled 2023-11-07: qty 3000

## 2023-11-07 MED ORDER — CARVEDILOL 25 MG PO TABS
37.5000 mg | ORAL_TABLET | Freq: Two times a day (BID) | ORAL | Status: DC
  Administered 2023-11-07 – 2023-11-08 (×2): 37.5 mg via ORAL
  Filled 2023-11-07 (×2): qty 1
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
## 2023-11-07 NOTE — Progress Notes (Addendum)
 Rounding Note    Patient Name: Lauren Delgado Date of Encounter: 11/07/2023  [facility] Cardiologist: Chrystie Nose, MD   Subjective   this am, denies any CP, SOB or dizziness. Able to walk to bathroom without any complaints. Patient was not very open to engage this interview.   Inpatient Medications    Scheduled Meds:  amLODipine  10 mg Oral Daily   atorvastatin  80 mg Oral Daily   carvedilol  25 mg Oral BID WC   clopidogrel  75 mg Oral Daily   furosemide  40 mg Oral BID   insulin aspart  0-15 Units Subcutaneous TID WC   insulin glargine  8 Units Subcutaneous QHS   nitroGLYCERIN  0.4 mg Transdermal Q2000   sacubitril-valsartan  1 tablet Oral BID   spironolactone  25 mg Oral Daily   Continuous Infusions:  heparin 1,400 Units/hr (11/07/23 0545)   PRN Meds: acetaminophen, nitroGLYCERIN, ondansetron (ZOFRAN) IV, mouth rinse   Vital Signs    Vitals:   11/06/23 2110 11/06/23 2305 11/07/23 0435 11/07/23 0700  BP: 119/64 (!) 120/59 135/82 134/77  Pulse: 81 80 79 77  Resp: 20 18 20 17   Temp: 97.9 F (36.6 C) 98 F (36.7 C) 98.3 F (36.8 C) 98 F (36.7 C)  TempSrc: Oral Oral Oral Oral  SpO2: 96% 97% 98% 98%  Weight:   (!) 136.6 kg   Height:        Intake/Output Summary (Last 24 hours) at 11/07/2023 0832 Last data filed at 11/07/2023 0865 Gross per 24 hour  Intake 336.85 ml  Output --  Net 336.85 ml      11/07/2023    4:35 AM 11/06/2023    6:23 PM 08/21/2023    8:04 AM  Last 3 Weights  Weight (lbs) 301 lb 3.2 oz 305 lb 4.8 oz 309 lb  Weight (kg) 136.623 kg 138.483 kg 140.161 kg      Telemetry    NSR, HR 70-80's with baseline artifact - Personally Reviewed  ECG    ST, HR 104, wandering baseline, chronic nonspecific ST/T changes, QT prolonged 505 ms - Personally Reviewed  Physical Exam   GEN: Laying in bed with no acute distress.   Neck: No JVD Cardiac: RRR, no murmurs, rubs, or gallops.  Respiratory: Clear to auscultation  bilaterally. GI: Soft, nontender, non-distended  MS: No edema; No deformity. Neuro:  Nonfocal  Psych: Normal affect   Labs    High Sensitivity Troponin:   Recent Labs  Lab 11/06/23 0442 11/06/23 0613 11/06/23 2303 11/07/23 0432  TROPONINIHS 84* 92* 103* 77*     Chemistry Recent Labs  Lab 11/06/23 0442 11/06/23 2303 11/07/23 0432  NA 136  --  135  K 4.0  --  3.6  CL 103  --  102  CO2 24  --  24  GLUCOSE 295*  --  287*  BUN 20  --  22*  CREATININE 1.18*  --  1.23*  CALCIUM 8.5*  --  8.7*  PROT  --  6.3*  --   ALBUMIN  --  2.5*  --   AST  --  14*  --   ALT  --  13  --   ALKPHOS  --  88  --   BILITOT  --  0.6  --   GFRNONAA >60  --  58*  ANIONGAP 9  --  9    Lipids  Recent Labs  Lab 11/07/23 0432  CHOL 177  TRIG 125  HDL 43  LDLCALC 109*  CHOLHDL 4.1    Hematology Recent Labs  Lab 11/06/23 0442  WBC 12.5*  RBC 4.26  HGB 12.4  HCT 37.3  MCV 87.6  MCH 29.1  MCHC 33.2  RDW 13.1  PLT 355   Thyroid  Recent Labs  Lab 11/06/23 2303  TSH 4.486    BNP Recent Labs  Lab 11/06/23 0442  BNP 174.7*    DDimer No results for input(s): "DDIMER" in the last 168 hours.   Radiology    DG Chest Port 1 View Result Date: 11/06/2023 CLINICAL DATA:  38 year old female with chest pain and shortness of breath. Nausea. EXAM: PORTABLE CHEST 1 VIEW COMPARISON:  Chest radiographs 08/09/2023 and earlier. FINDINGS: Portable AP view at 0502 hours. Stable mild cardiomegaly. Other mediastinal contours are within normal limits. Visualized tracheal air column is within normal limits. 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 And much of this is seen to be reticulonodular calcification on a 2023 CT. No superimposed pneumothorax, pleural effusion, confluent opacity. No acute osseous abnormality identified. Paucity of bowel gas. IMPRESSION: 1. 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 2.  No acute  cardiopulmonary abnormality. Electronically Signed   By: Odessa Fleming M.D.   On: 11/06/2023 05:44    Cardiac Studies     Patient Profile     38 y.o. female  with a hx of HTN, DM2, HLD, morbid obesity, CAD with multivessel PCI 11/2017 with DES to LAD, ramus, and RCA for NSTEMI, chronic HFmrEF who is being seen 11/06/2023 for the evaluation of chest pain at the request of Dr Austin Miles.   Assessment & Plan   CAD s/p PCI 11/2017 with DES to LAD, ramus, and RCA for NSTEMI  Chest pain   - 11/2017 cath: prox to mid LAD 99%, ramus 80%, RCA prox 80% and distal 70%.  DES to LAD, DES to ramus, DES to RCA  - hsTN  84 > 92 > 103 >77 - EKG: ST, HR 104, wandering baseline, chronic nonspecific ST/T changes, QT prolonged 505 ms  - Repeat 12 lead EKG  - Repeat echo pending. Based on results, will decide if cath. Due to BMI 56, noninvasive imaging limited.  - Currently on Hep gtt, NTG patch, Plavix 75 mg, Lipitor 80 mg, carvedilol 25 mg BID, Entresto 24/26 mg BID, spironolactone 25 mg, amlodipine 10 mg, furosemide 40 mg PO BID - this am, denies any CP, SOB or dizziness. Able to walk to bathroom without any complaints.   HTN  - Presented w/ 170's/110's. This am currently 134/77 - currently on home medication carvedilol 25 mg BID, Entresto 24/26 mg BID, spironolactone 25 mg, amlodipine 10 mg,  HFmrEF - 03/2022 echo: LVE 45-50%, grade II dd, normal RV  - CXR no acute process, BNP 174 - current GDMT: Entresto 24/26 mg BID, carvedilol 25 mg, spironolactone 25 mg - Hold Jardiance in case cath is indicated. Will plan to restart at d/c. - Also on furosemide 40 mg PO BID - Continue daily weight, I/O - echo pending  HLD  - LDL: 109, goal of <55, Currently on Lipitor 80 mg - per chart review, zetia was added in the past. However, patient is not sure if she was ever on it or why it was stopped. Adding Zetia 10 mg. - Can consider outpatient lipid clinic referral   DM2 - A1C 9.9  - Managed by primary   CKD stage  II -  Baseline creatinine around 1-1.1.   -  1.18 > 1.23   Class III obesity  - BMI 55.84  - Currently on mounjaro 5 mg weekly     For questions or updates, please contact [facility] Please consult [email or web address] for contact info under        Signed, Basilio Cairo, PA-C  11/07/2023, 8:32 AM      Patient seen and examined. Agree with assessment and plan.  Currently pain-free getting echo.  She describes her chest pain yesterday as sharp twinges of chest pain.  Ultimately she was given sublingual nitroglycerin as well as nitroglycerin patch with improvement.  Patient states she had undergone prior intervention in 2019.  She is uncertain if her symptoms were at that time.  She has had significant issues with suboptimal blood pressure control.  Troponins are very mildly  are mildly increased 84 > 92 > 103 > 77; probably more significant for demand ischemia rather than ACS.  Echo currently being done for reassessment of LV function.  BMI is significant for super morbid obesity with height 5\' 2" , weight 309 lbs.  Currently on Nitropaste.  Plan to increase isosorbide to 60 mg daily, and aspirin to clopidogrel.  Will slightly increase carvedilol to 37.5 mg twice a day.  Add Zetia to Lipitor 80 mg.  Body habitus will make noninvasive strategy difficult.  If she develops recurrent symptomatology may need definitive repeat cardiac catheterization.  Lennette Bihari, MD, [facility] 11/07/2023 10:42 AM

## 2023-11-07 NOTE — Progress Notes (Signed)
 PHARMACY - ANTICOAGULATION CONSULT NOTE  Pharmacy Consult for heparin Indication: chest pain/ACS  Labs: Recent Labs    11/06/23 0442 11/06/23 0613 11/06/23 2303 11/07/23 0432  HGB 12.4  --   --   --   HCT 37.3  --   --   --   PLT 355  --   --   --   HEPARINUNFRC  --   --   --  0.19*  CREATININE 1.18*  --   --   --   TROPONINIHS 84* 92* 103*  --    Assessment: 37yo female subtherapeutic on heparin with initial dosing for CP; no infusion issues or signs of bleeding per RN.  Goal of Therapy:  Heparin level 0.3-0.7 units/ml   Plan:  3000 units heparin bolus. Increase heparin infusion by 3 units/kgABW/hr to 1400 units/hr. Check level in 6 hours.   Vernard Gambles, PharmD, BCPS 11/07/2023 5:29 AM

## 2023-11-07 NOTE — Inpatient Diabetes Management (Signed)
 Inpatient Diabetes Program Recommendations  AACE/ADA: New Consensus Statement on Inpatient Glycemic Control (2015)  Target Ranges:  Prepandial:   less than 140 mg/dL      Peak postprandial:   less than 180 mg/dL (1-2 hours)      Critically ill patients:  140 - 180 mg/dL   Lab Results  Component Value Date   GLUCAP 220 (H) 11/07/2023   HGBA1C 9.9 (H) 11/06/2023    Review of Glycemic Control  Latest Reference Range & Units 11/06/23 09:26 11/06/23 12:07 11/06/23 17:01 11/06/23 21:04 11/07/23 07:44  Glucose-Capillary 70 - 99 mg/dL 644 (H) 034 (H) 742 (H) 158 (H) 220 (H)   Diabetes history: DM 2 Outpatient Diabetes medications: Jardiance 25 mg Daily, Glipizide 5 mg Daily, 70/30 10 units bid, Mounjaro 5 mg weekly Current orders for Inpatient glycemic control:  Lantus 8 units qhs Novolog 0-15 units tid  Inpatient Diabetes Program Recommendations:    -   Increase Lantus to 12 units -   Reduce Novolog correction to 0-9 units tid   Thanks, Christena Deem RN, MSN, BC-ADM Inpatient Diabetes Coordinator Team Pager [phone] (8a-5p)

## 2023-11-07 NOTE — Progress Notes (Signed)
   11/06/23 2305  Provider Notification  Provider Name/Title Dr. Merrilyn Puma  Date Provider Notified 11/06/23  Time Provider Notified 2356  Method of Notification Page [facility] chat)  Notification Reason Critical Result  Test performed and critical result Troponin 103  Date Critical Result Received 11/06/23  Time Critical Result Received 2348  Provider response No new orders;Other (Comment) (Continue to trend troponins)  Date of Provider Response 11/07/23  Time of Provider Response 0004   Felicity Coyer, RN

## 2023-11-07 NOTE — Hospital Course (Addendum)
 37 yof w/ significant cardiac history including CAD with multivessel PCI (11/2017, with DES to LAD, ramus, and RCA for NSTEMI), chronic HFmrEF, history of CVA, hyperlipidemia, hypertension, type 2 diabetes, class III obesity, CKD stage II presented with chest pain for few days, seen in the ED, was hypertensive mildly hypoxic needing 2 L nasal cannula and workup showed mild leukocytosis hyperglycemia elevated troponin 84>92, with EKG showing sinus rhythm nonspecific ST/T changes, cardiology consulted and admitted with working diagnosis of NSTEMI. SEEN BY CARDIO> tte was done and is stable and per cardiology meds adjusted> 3/7:stopped  Heparin and NTG patch.Restarted NTG 0.4 prn and restart and increase Imdur 60 mgm added asa 81mg .  Patient will continue on increased Coreg 37.5 twice daily, Imdur.  Prescription has been sent to [facility] pharmacy There was plan to discharge home after blood sugar improved but patient left AGAINST MEDICAL ADVICE  Consultation: Cardiology  Procedure: None

## 2023-11-07 NOTE — Plan of Care (Signed)
  Problem: Nutritional: Goal: Maintenance of adequate nutrition will improve Outcome: Progressing   Problem: Clinical Measurements: Goal: Respiratory complications will improve Outcome: Progressing Goal: Cardiovascular complication will be avoided Outcome: Progressing   Problem: Nutrition: Goal: Adequate nutrition will be maintained Outcome: Progressing   Problem: Elimination: Goal: Will not experience complications related to urinary retention Outcome: Progressing   Problem: Pain Managment: Goal: General experience of comfort will improve and/or be controlled Outcome: Progressing   Problem: Safety: Goal: Ability to remain free from injury will improve Outcome: Progressing   Problem: Activity: Goal: Ability to tolerate increased activity will improve Outcome: Progressing   Problem: Cardiac: Goal: Ability to achieve and maintain adequate cardiovascular perfusion will improve Outcome: Progressing

## 2023-11-07 NOTE — Progress Notes (Signed)
 PROGRESS NOTE Lauren Delgado  WUJ:[id number] DOB: [birth year] DOA: 11/06/2023 PCP: Georgina Quint, MD  Brief Narrative/Hospital Course: 38 yof w/ significant cardiac history including CAD with multivessel PCI (11/2017, with DES to LAD, ramus, and RCA for NSTEMI), chronic HFmrEF, history of CVA, hyperlipidemia, hypertension, type 2 diabetes, class III obesity, CKD stage II presented with chest pain for few days, seen in the ED, was hypertensive mildly hypoxic needing 2 L nasal cannula and workup showed mild leukocytosis hyperglycemia elevated troponin 84>92, with EKG showing sinus rhythm nonspecific ST/T changes, cardiology consulted and admitted with working diagnosis of NSTEMI.  Consultation: Cardiology   Subjective: Seen and examined this morning Denies chest pain this morning  Waiting to be seen by cardiology otherwise no complaints.  Assessment and Plan: Principal Problem:   NSTEMI (non-ST elevated myocardial infarction) (HCC) Active Problems:   chest pain with hx of CAD S/P percutaneous coronary angioplasty   History of CVA (cerebrovascular accident)   Type 2 diabetes mellitus without complication (HCC)   Hypertension associated with diabetes (HCC)   Hyperlipidemia associated with type 2 diabetes mellitus (HCC)   Chronic combined systolic and diastolic heart failure (HCC)   NSTEMI CAD with previous multivessel PCI: Presenting with chest pain in the setting of CAD and with elevated troponin trops: 84>92>103>77.  Managed with heparin drip, along with home Lipitor Coreg Plavix nitroglycerin patch.  Cardiology has been consulted-awaiting further plan   HFmrEF: BNP mildly elevated 174 on admission previous EF 45-50% and G2 DD, difficult to assess volume status due to body habitus x-ray without acute finding.  Continue home Lasix along with GDMT Coreg/Entresto/Aldactone.  Holding [name].  Hypertension: BP stable continue home meds as above.  T2DM with uncontrolled  hyperglycemia: PTA on Humulin 70/30 15 units twice daily, glipizide 5, Jardiance 25, Mounjaro 5 weekly.  A1c 9.9, continue SSI and Lantus 18 units nightly while NPO, target CBG 140-180. Recent Labs  Lab 11/06/23 0926 11/06/23 1207 11/06/23 1701 11/06/23 2104 11/06/23 2303 11/07/23 0744  GLUCAP 218* 195* 311* 158*  --  220*  HGBA1C  --   --   --   --  9.9*  --     HLD: Continue Lipitor.  History of CVA: Continue home Plavix and Lipitor  CKD 2: Creatinine slightly up monitor  Morbid obesity BMI 45 Patient's Body mass index is 55.09 kg/m. : Will benefit with PCP follow-up, weight loss  healthy lifestyle and outpatient sleep evaluation.   DVT prophylaxis: heparin Code Status:   Code Status: Full Code Family Communication: plan of care discussed with patient/none at bedside. Patient status is: Remains hospitalized because of severity of illness Level of care: Telemetry Cardiac   Dispo: The patient is from: home            Anticipated disposition: TBD Objective: Vitals last 24 hrs: Vitals:   11/06/23 2110 11/06/23 2305 11/07/23 0435 11/07/23 0700  BP: 119/64 (!) 120/59 135/82 134/77  Pulse: 81 80 79 77  Resp: 20 18 20 17   Temp: 97.9 F (36.6 C) 98 F (36.7 C) 98.3 F (36.8 C) 98 F (36.7 C)  TempSrc: Oral Oral Oral Oral  SpO2: 96% 97% 98% 98%  Weight:   (!) 136.6 kg   Height:       Weight change:   Physical Examination: General exam: alert awake,at baseline, older than stated age HEENT:Oral mucosa moist, Ear/Nose WNL grossly Respiratory system: Bilaterally clear BS,no use of accessory muscle Cardiovascular system: S1 & S2 +, No JVD. Gastrointestinal  system: Abdomen soft,NT,ND, BS+ Nervous System: Alert, awake, moving all extremities,and following commands. Extremities: LE edema neg,distal peripheral pulses palpable and warm.  Skin: No rashes,no icterus. MSK: Normal muscle bulk,tone, power   Medications reviewed:  Scheduled Meds:  amLODipine  10 mg Oral  Daily   atorvastatin  80 mg Oral Daily   carvedilol  37.5 mg Oral BID WC   clopidogrel  75 mg Oral Daily   ezetimibe  10 mg Oral Daily   furosemide  40 mg Oral BID   insulin aspart  0-15 Units Subcutaneous TID WC   insulin glargine  8 Units Subcutaneous QHS   nitroGLYCERIN  0.4 mg Transdermal Q2000   sacubitril-valsartan  1 tablet Oral BID   spironolactone  25 mg Oral Daily   Continuous Infusions:  heparin 1,400 Units/hr (11/07/23 0545)    Diet Order             Diet NPO time specified  Diet effective midnight                  Intake/Output Summary (Last 24 hours) at 11/07/2023 1121 Last data filed at 11/07/2023 0619 Gross per 24 hour  Intake 336.85 ml  Output --  Net 336.85 ml   Net IO Since Admission: 336.85 mL [11/07/23 1121]  Wt Readings from Last 3 Encounters:  11/07/23 (!) 136.6 kg  08/21/23 (!) 140.2 kg  05/09/23 (!) 138.3 kg     Unresulted Labs (From admission, onward)     Start     Ordered   11/08/23 0500  Heparin level (unfractionated)  Daily,   R      11/06/23 2005   11/08/23 0500  Lipoprotein A (LPA)  Tomorrow morning,   R        11/07/23 1058   11/08/23 0500  Comprehensive metabolic panel  Tomorrow morning,   R        11/07/23 1058   11/07/23 1130  Heparin level (unfractionated)  Once-Timed,   TIMED        11/07/23 0529   11/07/23 0500  Basic metabolic panel  Daily,   R     Comments: As Scheduled for 5 days    11/06/23 1935          Data Reviewed: I have personally reviewed following labs and imaging studies CBC: Recent Labs  Lab 11/06/23 0442  WBC 12.5*  HGB 12.4  HCT 37.3  MCV 87.6  PLT 355   Basic Metabolic Panel:  Recent Labs  Lab 11/06/23 0442 11/07/23 0432  NA 136 135  K 4.0 3.6  CL 103 102  CO2 24 24  GLUCOSE 295* 287*  BUN 20 22*  CREATININE 1.18* 1.23*  CALCIUM 8.5* 8.7*   GFR: Estimated Creatinine Clearance: 84.5 mL/min (A) (by C-G formula based on SCr of 1.23 mg/dL (H)). Liver Function Tests:  Recent Labs  Lab  11/06/23 2303  AST 14*  ALT 13  ALKPHOS 88  BILITOT 0.6  PROT 6.3*  ALBUMIN 2.5*   Recent Labs    11/06/23 2303  HGBA1C 9.9*   Recent Labs  Lab 11/06/23 0926 11/06/23 1207 11/06/23 1701 11/06/23 2104 11/07/23 0744  GLUCAP 218* 195* 311* 158* 220*   Recent Labs    11/07/23 0432  CHOL 177  HDL 43  LDLCALC 109*  TRIG 125  CHOLHDL 4.1   Recent Labs    11/06/23 2303  TSH 4.486   Sepsis Labs: No results for input(s): "PROCALCITON", "LATICACIDVEN" in the last 168  hours. Recent Results (from the past 240 hours)  Resp panel by RT-PCR (RSV, Flu A&B, Covid) Anterior Nasal Swab     Status: None   Collection Time: 11/06/23  9:30 AM   Specimen: Anterior Nasal Swab  Result Value Ref Range Status   SARS Coronavirus 2 by RT PCR NEGATIVE NEGATIVE Final    Comment: (NOTE) SARS-CoV-2 target nucleic acids are NOT DETECTED.  The SARS-CoV-2 RNA is generally detectable in upper respiratory specimens during the acute phase of infection. The lowest concentration of SARS-CoV-2 viral copies this assay can detect is 138 copies/mL. A negative result does not preclude SARS-Cov-2 infection and should not be used as the sole basis for treatment or other patient management decisions. A negative result may occur with  improper specimen collection/handling, submission of specimen other than nasopharyngeal swab, presence of viral mutation(s) within the areas targeted by this assay, and inadequate number of viral copies(<138 copies/mL). A negative result must be combined with clinical observations, patient history, and epidemiological information. The expected result is Negative.  Fact Sheet for Patients:  BloggerCourse.com  Fact Sheet for Healthcare Providers:  SeriousBroker.it  This test is no t yet approved or cleared by the [place] FDA and  has been authorized for detection and/or diagnosis of SARS-CoV-2 by FDA under an  Emergency Use Authorization (EUA). This EUA will remain  in effect (meaning this test can be used) for the duration of the COVID-19 declaration under Section 564(b)(1) of the Act, 21 U.S.C.section 360bbb-3(b)(1), unless the authorization is terminated  or revoked sooner.       Influenza A by PCR NEGATIVE NEGATIVE Final   Influenza B by PCR NEGATIVE NEGATIVE Final    Comment: (NOTE) The Xpert Xpress SARS-CoV-2/FLU/RSV plus assay is intended as an aid in the diagnosis of influenza from Nasopharyngeal swab specimens and should not be used as a sole basis for treatment. Nasal washings and aspirates are unacceptable for Xpert Xpress SARS-CoV-2/FLU/RSV testing.  Fact Sheet for Patients: BloggerCourse.com  Fact Sheet for Healthcare Providers: SeriousBroker.it  This test is not yet approved or cleared by the [place] FDA and has been authorized for detection and/or diagnosis of SARS-CoV-2 by FDA under an Emergency Use Authorization (EUA). This EUA will remain in effect (meaning this test can be used) for the duration of the COVID-19 declaration under Section 564(b)(1) of the Act, 21 U.S.C. section 360bbb-3(b)(1), unless the authorization is terminated or revoked.     Resp Syncytial Virus by PCR NEGATIVE NEGATIVE Final    Comment: (NOTE) Fact Sheet for Patients: BloggerCourse.com  Fact Sheet for Healthcare Providers: SeriousBroker.it  This test is not yet approved or cleared by the [place] FDA and has been authorized for detection and/or diagnosis of SARS-CoV-2 by FDA under an Emergency Use Authorization (EUA). This EUA will remain in effect (meaning this test can be used) for the duration of the COVID-19 declaration under Section 564(b)(1) of the Act, 21 U.S.C. section 360bbb-3(b)(1), unless the authorization is terminated or revoked.  Performed at [facility], [address] [place], [place] [address]     Antimicrobials/Microbiology: Anti-infectives (From admission, onward)    None         Component Value Date/Time   SDES  04/06/2021 1040    URINE, CLEAN CATCH Performed at [facility], [address] [place], [place] [address]    [facility]  04/06/2021 1040    NONE Performed at [facility], [address], [place], [place] [address]  CULT >=100,000 COLONIES/mL ESCHERICHIA COLI (A) 04/06/2021 1040   REPTSTATUS 04/08/2021 FINAL 04/06/2021 1040     Radiology Studies: DG Chest Port 1 View Result Date: 11/06/2023 CLINICAL DATA:  38 year old female with chest pain and shortness of breath. Nausea. EXAM: PORTABLE CHEST 1 VIEW COMPARISON:  Chest radiographs 08/09/2023 and earlier. FINDINGS: Portable AP view at 0502 hours. Stable mild cardiomegaly. Other mediastinal contours are within normal limits. Visualized tracheal air column is within normal limits. 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 And much of this is seen to be reticulonodular calcification on a 2023 CT. No superimposed pneumothorax, pleural effusion, confluent opacity. No acute osseous abnormality identified. Paucity of bowel gas. IMPRESSION: 1. 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 2.  No acute cardiopulmonary abnormality. Electronically Signed   By: Odessa Fleming M.D.   On: 11/06/2023 05:44     LOS: 0 days   Total time spent in review of labs and imaging, patient evaluation, formulation of plan, documentation and communication with family: 35 minutes  Lanae Boast, MD  [facility]  11/07/2023, 11:21 AM
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
## 2023-11-08 ENCOUNTER — Other Ambulatory Visit (HOSPITAL_COMMUNITY): Payer: Self-pay

## 2023-11-08 ENCOUNTER — Encounter (HOSPITAL_COMMUNITY): Payer: Self-pay | Admitting: Internal Medicine

## 2023-11-08 DIAGNOSIS — R079 Chest pain, unspecified: Secondary | ICD-10-CM | POA: Diagnosis not present

## 2023-11-08 DIAGNOSIS — Z1152 Encounter for screening for COVID-19: Secondary | ICD-10-CM | POA: Diagnosis not present

## 2023-11-08 DIAGNOSIS — E785 Hyperlipidemia, unspecified: Secondary | ICD-10-CM | POA: Diagnosis not present

## 2023-11-08 DIAGNOSIS — E1165 Type 2 diabetes mellitus with hyperglycemia: Secondary | ICD-10-CM | POA: Diagnosis not present

## 2023-11-08 DIAGNOSIS — E1159 Type 2 diabetes mellitus with other circulatory complications: Secondary | ICD-10-CM | POA: Diagnosis not present

## 2023-11-08 DIAGNOSIS — I130 Hypertensive heart and chronic kidney disease with heart failure and stage 1 through stage 4 chronic kidney disease, or unspecified chronic kidney disease: Secondary | ICD-10-CM | POA: Diagnosis not present

## 2023-11-08 DIAGNOSIS — I2510 Atherosclerotic heart disease of native coronary artery without angina pectoris: Secondary | ICD-10-CM | POA: Diagnosis not present

## 2023-11-08 DIAGNOSIS — I214 Non-ST elevation (NSTEMI) myocardial infarction: Secondary | ICD-10-CM | POA: Diagnosis not present

## 2023-11-08 DIAGNOSIS — Z9861 Coronary angioplasty status: Secondary | ICD-10-CM | POA: Diagnosis not present

## 2023-11-08 DIAGNOSIS — I152 Hypertension secondary to endocrine disorders: Secondary | ICD-10-CM | POA: Diagnosis not present

## 2023-11-08 DIAGNOSIS — N182 Chronic kidney disease, stage 2 (mild): Secondary | ICD-10-CM | POA: Diagnosis not present

## 2023-11-08 DIAGNOSIS — I5022 Chronic systolic (congestive) heart failure: Secondary | ICD-10-CM | POA: Diagnosis not present

## 2023-11-08 DIAGNOSIS — I5042 Chronic combined systolic (congestive) and diastolic (congestive) heart failure: Secondary | ICD-10-CM | POA: Diagnosis not present

## 2023-11-08 DIAGNOSIS — Z8673 Personal history of transient ischemic attack (TIA), and cerebral infarction without residual deficits: Secondary | ICD-10-CM

## 2023-11-08 DIAGNOSIS — R7989 Other specified abnormal findings of blood chemistry: Secondary | ICD-10-CM | POA: Diagnosis not present

## 2023-11-08 DIAGNOSIS — E119 Type 2 diabetes mellitus without complications: Secondary | ICD-10-CM | POA: Diagnosis not present

## 2023-11-08 DIAGNOSIS — Z794 Long term (current) use of insulin: Secondary | ICD-10-CM | POA: Diagnosis not present

## 2023-11-08 DIAGNOSIS — E1169 Type 2 diabetes mellitus with other specified complication: Secondary | ICD-10-CM | POA: Diagnosis not present

## 2023-11-08 LAB — CBC
HCT: 34 % — ABNORMAL LOW (ref 36.0–46.0)
Hemoglobin: 11.4 g/dL — ABNORMAL LOW (ref 12.0–15.0)
MCH: 29.1 pg (ref 26.0–34.0)
MCHC: 33.5 g/dL (ref 30.0–36.0)
MCV: 86.7 fL (ref 80.0–100.0)
Platelets: 325 10*3/uL (ref 150–400)
RBC: 3.92 MIL/uL (ref 3.87–5.11)
RDW: 13 % (ref 11.5–15.5)
WBC: 9 10*3/uL (ref 4.0–10.5)
nRBC: 0 % (ref 0.0–0.2)

## 2023-11-08 LAB — COMPREHENSIVE METABOLIC PANEL
ALT: 12 U/L (ref 0–44)
AST: 13 U/L — ABNORMAL LOW (ref 15–41)
Albumin: 2.5 g/dL — ABNORMAL LOW (ref 3.5–5.0)
Alkaline Phosphatase: 96 U/L (ref 38–126)
Anion gap: 6 (ref 5–15)
BUN: 25 mg/dL — ABNORMAL HIGH (ref 6–20)
CO2: 25 mmol/L (ref 22–32)
Calcium: 8 mg/dL — ABNORMAL LOW (ref 8.9–10.3)
Chloride: 103 mmol/L (ref 98–111)
Creatinine, Ser: 1.31 mg/dL — ABNORMAL HIGH (ref 0.44–1.00)
GFR, Estimated: 54 mL/min — ABNORMAL LOW (ref >60)
Glucose, Bld: 360 mg/dL — ABNORMAL HIGH (ref 70–99)
Potassium: 3.8 mmol/L (ref 3.5–5.1)
Sodium: 134 mmol/L — ABNORMAL LOW (ref 135–145)
Total Bilirubin: 0.2 mg/dL (ref 0.0–1.2)
Total Protein: 6.3 g/dL — ABNORMAL LOW (ref 6.5–8.1)

## 2023-11-08 LAB — HEPARIN LEVEL (UNFRACTIONATED): Heparin Unfractionated: 0.46 [IU]/mL (ref 0.30–0.70)

## 2023-11-08 LAB — GLUCOSE, CAPILLARY: Glucose-Capillary: 344 mg/dL — ABNORMAL HIGH (ref 70–99)

## 2023-11-08 MED ORDER — EMPAGLIFLOZIN 25 MG PO TABS
25.0000 mg | ORAL_TABLET | Freq: Every day | ORAL | Status: DC
  Administered 2023-11-08: 25 mg via ORAL
  Filled 2023-11-08: qty 1

## 2023-11-08 MED ORDER — ASPIRIN 81 MG PO TBEC
81.0000 mg | DELAYED_RELEASE_TABLET | Freq: Every day | ORAL | 0 refills | Status: AC
  Filled 2023-11-08: qty 30, 30d supply, fill #0

## 2023-11-08 MED ORDER — EZETIMIBE 10 MG PO TABS
10.0000 mg | ORAL_TABLET | Freq: Every day | ORAL | 0 refills | Status: DC
  Filled 2023-11-08: qty 30, 30d supply, fill #0

## 2023-11-08 MED ORDER — ASPIRIN 81 MG PO TBEC
81.0000 mg | DELAYED_RELEASE_TABLET | Freq: Every day | ORAL | Status: DC
  Administered 2023-11-08: 81 mg via ORAL
  Filled 2023-11-08: qty 1

## 2023-11-08 MED ORDER — ISOSORBIDE MONONITRATE ER 60 MG PO TB24
60.0000 mg | ORAL_TABLET | Freq: Every day | ORAL | 0 refills | Status: DC
  Filled 2023-11-08: qty 30, 30d supply, fill #0

## 2023-11-08 MED ORDER — INSULIN GLARGINE 100 UNIT/ML ~~LOC~~ SOLN
15.0000 [IU] | Freq: Every day | SUBCUTANEOUS | Status: DC
  Administered 2023-11-08: 15 [IU] via SUBCUTANEOUS
  Filled 2023-11-08: qty 0.15

## 2023-11-08 MED ORDER — CARVEDILOL 12.5 MG PO TABS
37.5000 mg | ORAL_TABLET | Freq: Two times a day (BID) | ORAL | 0 refills | Status: DC
  Filled 2023-11-08: qty 180, 30d supply, fill #0

## 2023-11-08 MED ORDER — ISOSORBIDE MONONITRATE ER 60 MG PO TB24
60.0000 mg | ORAL_TABLET | Freq: Every day | ORAL | Status: DC
  Administered 2023-11-08: 60 mg via ORAL
  Filled 2023-11-08: qty 1

## 2023-11-08 MED ORDER — GLIPIZIDE ER 5 MG PO TB24
5.0000 mg | ORAL_TABLET | Freq: Every day | ORAL | Status: DC
  Administered 2023-11-08: 5 mg via ORAL
  Filled 2023-11-08: qty 1

## 2023-11-08 NOTE — Progress Notes (Signed)
 Responded to patient's room after notification from telemetry that she was not on the monitor.  Patient had completely dressed and was off telemetry; stated that her ride was waiting downstairs and that she would be leaving.  Notified MD and attempted to persuade patient to stay to allow [place] to continue managing diabetes.  She was adamant that she would not stay longer in the hospital, mainly because she relies on family for transportation and they will not be available to drive her at another time.  This RN provided education regarding Lantus, Glucotrol and emphasized importance of outpatient follow-up with primary care provider for diabetes management.  Patient agreed to this.  She also agreed to wait for some medications to be prepared by [facility] pharmacy.  Patient left against medical advice.

## 2023-11-08 NOTE — Progress Notes (Signed)
 Heart Failure Stewardship Pharmacist Progress Note   PCP: Georgina Quint, MD PCP-Cardiologist: Chrystie Nose, MD    HPI:  38 yo F with PMH of CHF with recovered EF, HTN, T2DM, morbid obesity, CAD, and CVA.   Admitted 2019 with NSTEMI. EF 35 to 40%, Diagnostic cath showed multivessel CAD including 99% proximal to mid LAD stenosis, 80% ramus stenosis, 80% proximal RCA stenosis, 70% distal RCA stenosis, EF 35 to 45%.  She underwent successful three-vessel PCI/DES to the proximal to mid LAD, ramus with 2 overlapping DES, and proximal RCA stenting. Was seen by Dr. Gala Romney with the advanced heart failure team.   She got pregnant in 11/2019 which led to significant changes to her medications based on fetal risk. Admitted to [facility] 3/23-25/21 with acute HF. She delivered via c-section in 05/2020. She was scheduled to follow up with AHF clinic afterwards but no showed in 06/2020. She has since been managed by Dr. Rennis Golden with [facility].   Admitted for acute CVA 08/2021. Admitted for acute on chronic CHF 02/2022.   Presented to the ED on 3/5 with chest pain, shortness of breath, nausea. In the ED was hypertensive and tachycardic. BNP 174. EKG with NSR and chronic nonspecific ST/T changes. CXR with no acute cardiopulmonary process. ECHO 3/6 showed LVEF 45% (was 45-50% 03/2022), G1DD, RV normal, mild MR.   Patient denies shortness of breath. Still with trace edema on exam. Able to lay flat. Denies cough, lightheadedness, or dizziness. Reviewed HF GDMT and she reports she sometimes has difficulty paying for Medicaid copays since she is not working right now. Willing to transition to [facility] order pharmacy. Requesting that all prescriptions be sent to [facility] at discharge since she is either out of medications or has a very limited supply at home. States she usually takes her second dose of lasix right before bed and is often up throughout the night using the restroom and feels fatigued in the morning. Educated on  lasix timing and she will begin to take the second dose earlier in the evening to hopefully have more restful sleep.   Current HF Medications: Diuretic: furosemide 40 mg PO BID Beta Blocker: carvedilol 37.5 mg BID ACE/ARB/ARNI: Entresto 24/26 mg BID MRA: spironolactone 25 mg daily SGLT2i: Jardiance 25 mg daily  Prior to admission HF Medications: Diuretic: furosemide 40 mg BID Beta blocker: carvedilol 25 mg BID ACE/ARB/ARNI: Entresto 24/26 mg BID MRA: spironolactone 25 mg daily SGLT2i: Jardiance 25 mg daily  Pertinent Lab Values: Serum creatinine 1.31, BUN 25, Potassium 3.8, Sodium 134, BNP 174.7, A1c 9.9  Vital Signs: Weight: 301 lbs (admission weight: 305 lbs) Blood pressure: 130/80s  Heart rate: 70-80s  I/O: incomplete  Medication Assistance / Insurance Benefits Check: Does the patient have prescription insurance?  Yes Type of insurance plan: [place] Medicaid  Outpatient Pharmacy:  Prior to [facility] Is the patient willing to use [facility]OC pharmacy at discharge? Yes Is the patient willing to transition their outpatient pharmacy to utilize a [facility] outpatient pharmacy?   Yes    Assessment: 1. Acute on chronic HFimpEF (LVEF 35-40% in 2019 >> 45%), due to ICM. NYHA class II symptoms. - Continue furosemide 40 mg PO BID. Strict I/Os and daily weights. Keep K>4 and Mg>2. - Continue carvedilol 37.5 mg BID - Continue Entresto 24/26 mg BID - may need to increase to 49/51 mg BID as outpatient if BP remains elevated. Encouraged pt to obtain BP cuff to start tracking BP at home.  -  Continue spironolactone 25 mg daily - Continue Jardiance 25 mg daily   Plan: 1) Medication changes recommended at this time: - Continue current regimen; discharge today - Patient requesting all medications be refilled from [facility] pharmacy today - May need to increase Entresto to 49/51 mg BID at follow up pending BP  2) Patient assistance: - None pending, has [place] Medicaid -  [facility]gree[facility] mail order pharmacy for enhanced compliance  3)  Education  - Patient has been educated on current HF medications and potential additions to HF medication regimen - Patient verbalizes understanding that over the next few months, these medication doses may change and more medications may be added to optimize HF regimen - Patient has been educated on basic disease state pathophysiology and goals of therapy   Sharen Hones, PharmD, BCPS Heart Failure Stewardship Pharmacist Phone [phone]

## 2023-11-08 NOTE — Discharge Summary (Signed)
 Physician Discharge Summary  Lauren Delgado ZOX:[id number] DOB: 02/10/1986 DOA: 11/06/2023  PCP: Georgina Quint, MD  Admit date: 11/06/2023 Discharge date: 11/08/2023 Recommendations for Outpatient Follow-up:  Follow up with PCP in 1 weeks-call for appointment Please obtain BMP/CBC in one week  Discharge Dispo: Home Discharge Condition: Stable Code Status:   Code Status: Full Code Diet recommendation:  Diet Order             Diet heart healthy/carb modified Room service appropriate? Yes; Fluid consistency: Thin  Diet effective now                    Brief/Interim Summary: 38 yof w/ significant cardiac history including CAD with multivessel PCI (11/2017, with DES to LAD, ramus, and RCA for NSTEMI), chronic HFmrEF, history of CVA, hyperlipidemia, hypertension, type 2 diabetes, class III obesity, CKD stage II presented with chest pain for few days, seen in the ED, was hypertensive mildly hypoxic needing 2 L nasal cannula and workup showed mild leukocytosis hyperglycemia elevated troponin 84>92, with EKG showing sinus rhythm nonspecific ST/T changes, cardiology consulted and admitted with working diagnosis of NSTEMI. SEEN BY CARDIO> tte was done and is stable and per cardiology meds adjusted> 3/7:stopped  Heparin and NTG patch.Restarted NTG 0.4 prn and restart and increase Imdur 60 mgm added asa 81mg .  Patient will continue on increased Coreg 37.5 twice daily, Imdur.  Prescription has been sent to [facility] pharmacy There was plan to discharge home after blood sugar improved but patient left AGAINST MEDICAL ADVICE  Consultation: Cardiology  Procedure: None   Discharge Diagnoses:  Principal Problem:   NSTEMI (non-ST elevated myocardial infarction) [facility]) Active Problems:   chest pain with hx of CAD S/P percutaneous coronary angioplasty   History of CVA (cerebrovascular accident)   Type 2 diabetes mellitus without complication (HCC)   Hypertension associated with diabetes (HCC)    Hyperlipidemia associated with type 2 diabetes mellitus (HCC)   Chronic combined systolic and diastolic heart failure [facility])  Patient left AGAINST MEDICAL ADVICE  NSTEMI CAD with previous multivessel PCI: Presenting with chest pain in the setting of CAD and with elevated troponin trops: 84>92>103>77.  Managed with heparin drip-seen by cardiology at this time discomfort heparin drip plan is for medical optimization and meds titrated to more chest pain.  If she does well she will be discharged home  HFmrEF: BNP mildly elevated 174 on admission previous EF 45-50% and G2 DD, difficult to assess volume status due to body habitus x-ray without acute finding.  Continue home regimen  and GDMT Coreg/Entresto/Aldactone.  Hypertension: BP stable continue home meds as above.  T2DM with uncontrolled hyperglycemia: PTA on Humulin 70/30 15 units twice daily, glipizide 5, Jardiance 25, Mounjaro 5 weekly.  A1c 9.9, managed with Lantus and SSI, on discharge will resume her home meds-will be adjusted inpatient. Recent Labs  Lab 11/06/23 2303 11/07/23 0744 11/07/23 1212 11/07/23 1543 11/07/23 2137 11/08/23 0744  GLUCAP  --  220* 190* 259* 334* 344*  HGBA1C 9.9*  --   --   --   --   --     HLD: Continue Lipitor.  History of CVA: Continue home Plavix and Lipitor  CKD 2: Creatinine slightly up monitor  Morbid obesity BMI 45 Patient's Body mass index is 55.2 kg/m. : Will benefit with PCP follow-up, weight loss  healthy lifestyle and outpatient sleep evaluation.  Subjective: Aaox3 Requesting for discharge ASAP while blood sugar poorly controlled  Discharge Exam: Vitals:   11/07/23  1951 11/08/23 0544  BP: (!) 130/90 131/83  Pulse: 82 77  Resp: 18 18  Temp: 97.8 F (36.6 C) 98.1 F (36.7 C)  SpO2: 98% 99%   General: Pt is alert, awake, not in acute distress Cardiovascular: RRR, S1/S2 +, no rubs, no gallops Respiratory: CTA bilaterally, no wheezing, no rhonchi Abdominal: Soft, NT, ND,  bowel sounds + Extremities: no edema, no cyanosis  Discharge Instructions  Discharge Instructions     Discharge instructions   Complete by: As directed    ,Please call call MD or return to ER for similar or worsening recurring problem that brought you to hospital or if any fever,nausea/vomiting,abdominal pain, uncontrolled pain, chest pain,  shortness of breath or any other alarming symptoms.  Please follow-up your doctor as instructed in a week time and call the office for appointment.  Please avoid alcohol, smoking, or any other illicit substance and maintain healthy habits including taking your regular medications as prescribed.  You were cared for by a hospitalist during your hospital stay. If you have any questions about your discharge medications or the care you received while you were in the hospital after you are discharged, you can call the unit and ask to speak with the hospitalist on call if the hospitalist that took care of you is not available.  Once you are discharged, your primary care physician will handle any further medical issues. Please note that NO REFILLS for any discharge medications will be authorized once you are discharged, as it is imperative that you return to your primary care physician (or establish a relationship with a primary care physician if you do not have one) for your aftercare needs so that they can reassess your need for medications and monitor your lab values   Increase activity slowly   Complete by: As directed       Allergies as of 11/08/2023       Reactions   Metformin And Related Diarrhea   Ozempic (0.25 Or 0.5 Mg-dose) [semaglutide(0.25 Or 0.5mg -dos)] Diarrhea        Medication List     TAKE these medications    amLODipine 10 MG tablet Commonly known as: NORVASC TAKE 1 TABLET BY MOUTH EVERY DAY   aspirin EC 81 MG tablet Take 1 tablet (81 mg total) by mouth daily. Swallow whole. Start taking on: November 09, 2023   atorvastatin 80 MG  tablet Commonly known as: LIPITOR TAKE 1 TABLET BY MOUTH EVERY DAY   BD Pen Needle Nano 2nd Gen 32G X 4 MM Misc Generic drug: Insulin Pen Needle USE TO INJECT INSULIN UP TO 4 TIMES DAILY AS NEEDED.   carvedilol 12.5 MG tablet Commonly known as: COREG Take 3 tablets (37.5 mg total) by mouth 2 (two) times daily with a meal. What changed:  medication strength how much to take   clopidogrel 75 MG tablet Commonly known as: PLAVIX TAKE 1 TABLET BY MOUTH EVERY DAY   empagliflozin 25 MG Tabs tablet Commonly known as: Jardiance Take 1 tablet (25 mg total) by mouth daily before breakfast.   Entresto 24-26 MG Generic drug: sacubitril-valsartan Take 1 tablet by mouth 2 (two) times daily.   ezetimibe 10 MG tablet Commonly known as: ZETIA Take 1 tablet (10 mg total) by mouth daily. Start taking on: November 09, 2023   furosemide 40 MG tablet Commonly known as: LASIX Take 40 mg by mouth 2 (two) times daily.   glipiZIDE 5 MG 24 hr tablet Commonly known as: GLUCOTROL XL TAKE  1 TABLET BY MOUTH EVERY DAY WITH BREAKFAST   HumuLIN 70/30 KwikPen (70-30) 100 UNIT/ML KwikPen Generic drug: insulin isophane & regular human KwikPen INJECT 10 UNITS EVERY MORNING AND EVENING. What changed:  how much to take when to take this   isosorbide mononitrate 60 MG 24 hr tablet Commonly known as: IMDUR Take 1 tablet (60 mg total) by mouth daily. Start taking on: November 09, 2023 What changed:  medication strength how much to take   Mirena (52 MG) 20 MCG/DAY Iud Generic drug: levonorgestrel Take 1 device by intrauterine route as directed.   nitroGLYCERIN 0.4 MG SL tablet Commonly known as: NITROSTAT Place 1 tablet (0.4 mg total) under the tongue every 5 (five) minutes as needed for chest pain.   spironolactone 25 MG tablet Commonly known as: ALDACTONE TAKE 1 TABLET (25 MG TOTAL) BY MOUTH DAILY.   tirzepatide 5 MG/0.5ML Pen Commonly known as: MOUNJARO Inject 5 mg into the skin once a week.    valACYclovir 500 MG tablet Commonly known as: VALTREX TAKE 1 TABLET BY MOUTH TWICE A DAY        Follow-up Information     Georgina Quint, MD Follow up in 1 week(s).   Specialty: Internal Medicine Contact information: [address] [place] [address] [phone]         Georgina Quint, MD .   Specialty: Internal Medicine Contact information: [address] [place] [address] [phone]         [facility]. Go in 16 day(s).   Specialty: Cardiology Why: Hospital follow up 11/25/2023 @ 11:45 am PLEASE bring a current medication list to appointment FREE valet parking, Entrance C, Off [employer] information: [address] [place] [address] [phone]               Allergies  Allergen Reactions   Metformin And Related Diarrhea   Ozempic (0.25 Or 0.5 Mg-Dose) [Semaglutide(0.25 Or 0.5mg -Dos)] Diarrhea    The results of significant diagnostics from this hospitalization (including imaging, microbiology, ancillary and laboratory) are listed below for reference.    Microbiology: Recent Results (from the past 240 hours)  Resp panel by RT-PCR (RSV, Flu A&B, Covid) Anterior Nasal Swab     Status: None   Collection Time: 11/06/23  9:30 AM   Specimen: Anterior Nasal Swab  Result Value Ref Range Status   SARS Coronavirus 2 by RT PCR NEGATIVE NEGATIVE Final    Comment: (NOTE) SARS-CoV-2 target nucleic acids are NOT DETECTED.  The SARS-CoV-2 RNA is generally detectable in upper respiratory specimens during the acute phase of infection. The lowest concentration of SARS-CoV-2 viral copies this assay can detect is 138 copies/mL. A negative result does not preclude SARS-Cov-2 infection and should not be used as the sole basis for treatment or other patient management decisions. A negative result may occur with  improper specimen collection/handling,  submission of specimen other than nasopharyngeal swab, presence of viral mutation(s) within the areas targeted by this assay, and inadequate number of viral copies(<138 copies/mL). A negative result must be combined with clinical observations, patient history, and epidemiological information. The expected result is Negative.  Fact Sheet for Patients:  BloggerCourse.com  Fact Sheet for Healthcare Providers:  SeriousBroker.it  This test is no t yet approved or cleared by the [place] FDA and  has been authorized for detection and/or diagnosis of SARS-CoV-2 by FDA under an Emergency Use Authorization (EUA). This EUA will  remain  in effect (meaning this test can be used) for the duration of the COVID-19 declaration under Section 564(b)(1) of the Act, 21 U.S.C.section 360bbb-3(b)(1), unless the authorization is terminated  or revoked sooner.       Influenza A by PCR NEGATIVE NEGATIVE Final   Influenza B by PCR NEGATIVE NEGATIVE Final    Comment: (NOTE) The Xpert Xpress SARS-CoV-2/FLU/RSV plus assay is intended as an aid in the diagnosis of influenza from Nasopharyngeal swab specimens and should not be used as a sole basis for treatment. Nasal washings and aspirates are unacceptable for Xpert Xpress SARS-CoV-2/FLU/RSV testing.  Fact Sheet for Patients: BloggerCourse.com  Fact Sheet for Healthcare Providers: SeriousBroker.it  This test is not yet approved or cleared by the [place] FDA and has been authorized for detection and/or diagnosis of SARS-CoV-2 by FDA under an Emergency Use Authorization (EUA). This EUA will remain in effect (meaning this test can be used) for the duration of the COVID-19 declaration under Section 564(b)(1) of the Act, 21 U.S.C. section 360bbb-3(b)(1), unless the authorization is terminated or revoked.     Resp Syncytial Virus by PCR NEGATIVE  NEGATIVE Final    Comment: (NOTE) Fact Sheet for Patients: BloggerCourse.com  Fact Sheet for Healthcare Providers: SeriousBroker.it  This test is not yet approved or cleared by the [place] FDA and has been authorized for detection and/or diagnosis of SARS-CoV-2 by FDA under an Emergency Use Authorization (EUA). This EUA will remain in effect (meaning this test can be used) for the duration of the COVID-19 declaration under Section 564(b)(1) of the Act, 21 U.S.C. section 360bbb-3(b)(1), unless the authorization is terminated or revoked.  Performed at [facility], [address], [place], [place] [address]     Procedures/Studies: ECHOCARDIOGRAM COMPLETE Result Date: 11/07/2023    ECHOCARDIOGRAM REPORT   Patient Name:   Lauren Delgado Date of Exam: 11/07/2023 Medical Rec #:  [id number]          Height:       62.0 in Accession #:    [id number]         Weight:       301.2 lb Date of Birth:  19-May-1986           BSA:          2.275 m Patient Age:    37 years           BP:           134/77 mmHg Patient Gender: F                  HR:           77 bpm. Exam Location:  Inpatient Procedure: 2D Echo, Color Doppler and Cardiac Doppler (Both Spectral and Color            Flow Doppler were utilized during procedure). Indications:    Chest Pain  History:        Patient has prior history of Echocardiogram examinations. CAD;                 Risk Factors:Hypertension and Diabetes.  Sonographer:    Lamont Snowball Referring Phys: [id number] SAGAR H JINWALA IMPRESSIONS  1. Left ventricular ejection fraction, by estimation, is 45%. The left ventricle has mildly decreased function. Left ventricular diastolic parameters are consistent with Grade I diastolic dysfunction (impaired relaxation). Elevated left atrial pressure.  2. Right ventricular systolic function is normal. The right ventricular size is normal.  3.  Mild mitral valve regurgitation.  4.  The aortic valve is tricuspid. Aortic valve regurgitation is not visualized.  5. The inferior vena cava is normal in size with greater than 50% respiratory variability, suggesting right atrial pressure of 3 mmHg. Comparison(s): The left ventricular function is unchanged. FINDINGS  Left Ventricle: Left ventricular ejection fraction, by estimation, is 45%. The left ventricle has mildly decreased function. The left ventricular internal cavity size was normal in size. There is no left ventricular hypertrophy. Left ventricular diastolic parameters are consistent with Grade I diastolic dysfunction (impaired relaxation). Elevated left atrial pressure. Right Ventricle: The right ventricular size is normal. Right vetricular wall thickness was not assessed. Right ventricular systolic function is normal. Left Atrium: Left atrial size was normal in size. Right Atrium: Right atrial size was normal in size. Pericardium: Trivial pericardial effusion is present. Mitral Valve: There is mild thickening of the mitral valve leaflet(s). Mild mitral annular calcification. Mild mitral valve regurgitation. MV peak gradient, 3.7 mmHg. The mean mitral valve gradient is 2.0 mmHg. Tricuspid Valve: The tricuspid valve is normal in structure. Tricuspid valve regurgitation is trivial. Aortic Valve: The aortic valve is tricuspid. Aortic valve regurgitation is not visualized. Pulmonic Valve: The pulmonic valve was normal in structure. Pulmonic valve regurgitation is not visualized. Aorta: The aortic root and ascending aorta are structurally normal, with no evidence of dilitation. Venous: The inferior vena cava is normal in size with greater than 50% respiratory variability, suggesting right atrial pressure of 3 mmHg. IAS/Shunts: No atrial level shunt detected by color flow Doppler.  LEFT VENTRICLE PLAX 2D LVIDd:         5.00 cm   Diastology LVIDs:         4.00 cm   LV e' medial:    4.90 cm/s LV PW:         0.80 cm   LV E/e' medial:  15.1 LV IVS:         0.70 cm   LV e' lateral:   4.13 cm/s LVOT diam:     2.10 cm   LV E/e' lateral: 17.9 LVOT Area:     3.46 cm  RIGHT VENTRICLE             IVC RV S prime:     14.70 cm/s  IVC diam: 1.30 cm TAPSE (M-mode): 1.8 cm LEFT ATRIUM             Index        RIGHT ATRIUM           Index LA Vol (A2C):   54.8 ml 24.09 ml/m  RA Area:     12.30 cm LA Vol (A4C):   59.4 ml 26.11 ml/m  RA Volume:   29.40 ml  12.92 ml/m LA Biplane Vol: 56.9 ml 25.01 ml/m   AORTA Ao Root diam: 2.70 cm Ao Asc diam:  2.80 cm MITRAL VALVE MV Area (PHT): 3.34 cm     SHUNTS MV Peak grad:  3.7 mmHg     Systemic Diam: 2.10 cm MV Mean grad:  2.0 mmHg MV Vmax:       0.96 m/s MV Vmean:      67.5 cm/s MV Decel Time: 227 msec MV E velocity: 74.10 cm/s MV A velocity: 103.00 cm/s MV E/A ratio:  0.72 Dietrich Pates MD Electronically signed by Dietrich Pates MD Signature Date/Time: 11/07/2023/2:25:25 PM    Final    DG Chest Port 1 View Result Date: 11/06/2023 CLINICAL DATA:  38 year old female with  chest pain and shortness of breath. Nausea. EXAM: PORTABLE CHEST 1 VIEW COMPARISON:  Chest radiographs 08/09/2023 and earlier. FINDINGS: Portable AP view at 0502 hours. Stable mild cardiomegaly. Other mediastinal contours are within normal limits. Visualized tracheal air column is within normal limits. 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 And much of this is seen to be reticulonodular calcification on a 2023 CT. No superimposed pneumothorax, pleural effusion, confluent opacity. No acute osseous abnormality identified. Paucity of bowel gas. IMPRESSION: 1. 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 2.  No acute cardiopulmonary abnormality. Electronically Signed   By: Odessa Fleming M.D.   On: 11/06/2023 05:44    Labs: BNP (last 3 results) Recent Labs    11/06/23 0442  BNP 174.7*   Basic Metabolic Panel: Recent Labs  Lab 11/06/23 0442 11/07/23 0432 11/08/23 0442  NA 136 135 134*   K 4.0 3.6 3.8  CL 103 102 103  CO2 24 24 25   GLUCOSE 295* 287* 360*  BUN 20 22* 25*  CREATININE 1.18* 1.23* 1.31*  CALCIUM 8.5* 8.7* 8.0*   Liver Function Tests: Recent Labs  Lab 11/06/23 2303 11/08/23 0442  AST 14* 13*  ALT 13 12  ALKPHOS 88 96  BILITOT 0.6 0.2  PROT 6.3* 6.3*  ALBUMIN 2.5* 2.5*   No results for input(s): "LIPASE", "AMYLASE" in the last 168 hours. No results for input(s): "AMMONIA" in the last 168 hours. CBC: Recent Labs  Lab 11/06/23 0442 11/08/23 0442  WBC 12.5* 9.0  HGB 12.4 11.4*  HCT 37.3 34.0*  MCV 87.6 86.7  PLT 355 325   Cardiac Enzymes: No results for input(s): "CKTOTAL", "CKMB", "CKMBINDEX", "TROPONINI" in the last 168 hours. BNP: Invalid input(s): "POCBNP" CBG: Recent Labs  Lab 11/07/23 0744 11/07/23 1212 11/07/23 1543 11/07/23 2137 11/08/23 0744  GLUCAP 220* 190* 259* 334* 344*   D-Dimer No results for input(s): "DDIMER" in the last 72 hours. Hgb A1c Recent Labs    11/06/23 2303  HGBA1C 9.9*   Lipid Profile Recent Labs    11/07/23 0432  CHOL 177  HDL 43  LDLCALC 109*  TRIG 125  CHOLHDL 4.1   Thyroid function studies Recent Labs    11/06/23 2303  TSH 4.486   Anemia work up No results for input(s): "VITAMINB12", "FOLATE", "FERRITIN", "TIBC", "IRON", "RETICCTPCT" in the last 72 hours. Urinalysis    Component Value Date/Time   COLORURINE YELLOW 06/24/2022 1343   APPEARANCEUR HAZY (A) 06/24/2022 1343   LABSPEC 1.021 06/24/2022 1343   PHURINE 5.5 06/24/2022 1343   GLUCOSEU >1,000 (A) 06/24/2022 1343   HGBUR MODERATE (A) 06/24/2022 1343   HGBUR trace-lysed 12/19/2006 1327   BILIRUBINUR NEGATIVE 06/24/2022 1343   BILIRUBINUR negative 10/10/2021 1036   BILIRUBINUR Negative 03/16/2020 1123   KETONESUR NEGATIVE 06/24/2022 1343   PROTEINUR 100 (A) 06/24/2022 1343   UROBILINOGEN 1.0 10/10/2021 1036   UROBILINOGEN 1.0 12/23/2019 1514   NITRITE NEGATIVE 06/24/2022 1343   LEUKOCYTESUR TRACE (A) 06/24/2022 1343    Sepsis Labs Recent Labs  Lab 11/06/23 0442 11/08/23 0442  WBC 12.5* 9.0   Microbiology Recent Results (from the past 240 hours)  Resp panel by RT-PCR (RSV, Flu A&B, Covid) Anterior Nasal Swab     Status: None   Collection Time: 11/06/23  9:30 AM   Specimen: Anterior Nasal Swab  Result Value Ref Range Status   SARS Coronavirus 2 by RT PCR NEGATIVE NEGATIVE Final    Comment: (NOTE) SARS-CoV-2 target  nucleic acids are NOT DETECTED.  The SARS-CoV-2 RNA is generally detectable in upper respiratory specimens during the acute phase of infection. The lowest concentration of SARS-CoV-2 viral copies this assay can detect is 138 copies/mL. A negative result does not preclude SARS-Cov-2 infection and should not be used as the sole basis for treatment or other patient management decisions. A negative result may occur with  improper specimen collection/handling, submission of specimen other than nasopharyngeal swab, presence of viral mutation(s) within the areas targeted by this assay, and inadequate number of viral copies(<138 copies/mL). A negative result must be combined with clinical observations, patient history, and epidemiological information. The expected result is Negative.  Fact Sheet for Patients:  BloggerCourse.com  Fact Sheet for Healthcare Providers:  SeriousBroker.it  This test is no t yet approved or cleared by the [place] FDA and  has been authorized for detection and/or diagnosis of SARS-CoV-2 by FDA under an Emergency Use Authorization (EUA). This EUA will remain  in effect (meaning this test can be used) for the duration of the COVID-19 declaration under Section 564(b)(1) of the Act, 21 U.S.C.section 360bbb-3(b)(1), unless the authorization is terminated  or revoked sooner.       Influenza A by PCR NEGATIVE NEGATIVE Final   Influenza B by PCR NEGATIVE NEGATIVE Final    Comment: (NOTE) The Xpert Xpress  SARS-CoV-2/FLU/RSV plus assay is intended as an aid in the diagnosis of influenza from Nasopharyngeal swab specimens and should not be used as a sole basis for treatment. Nasal washings and aspirates are unacceptable for Xpert Xpress SARS-CoV-2/FLU/RSV testing.  Fact Sheet for Patients: BloggerCourse.com  Fact Sheet for Healthcare Providers: SeriousBroker.it  This test is not yet approved or cleared by the [place] FDA and has been authorized for detection and/or diagnosis of SARS-CoV-2 by FDA under an Emergency Use Authorization (EUA). This EUA will remain in effect (meaning this test can be used) for the duration of the COVID-19 declaration under Section 564(b)(1) of the Act, 21 U.S.C. section 360bbb-3(b)(1), unless the authorization is terminated or revoked.     Resp Syncytial Virus by PCR NEGATIVE NEGATIVE Final    Comment: (NOTE) Fact Sheet for Patients: BloggerCourse.com  Fact Sheet for Healthcare Providers: SeriousBroker.it  This test is not yet approved or cleared by the [place] FDA and has been authorized for detection and/or diagnosis of SARS-CoV-2 by FDA under an Emergency Use Authorization (EUA). This EUA will remain in effect (meaning this test can be used) for the duration of the COVID-19 declaration under Section 564(b)(1) of the Act, 21 U.S.C. section 360bbb-3(b)(1), unless the authorization is terminated or revoked.  Performed at [facility], [address] [place], [place] [address]      Time coordinating discharge: 25  minutes  SIGNED: Lanae Boast, MD  [facility] 11/08/2023, 1:14 PM  If 7PM-7AM, please contact night-coverage [email or web address]

## 2023-11-08 NOTE — Progress Notes (Addendum)
 Rounding Note    Patient Name: Lauren Delgado Date of Encounter: 11/08/2023  [facility] Cardiologist: Chrystie Nose, MD   Subjective   this am, pt denies any CP, SOB, or dizziness. She is ready to be discharged.   Inpatient Medications    Scheduled Meds:  amLODipine  10 mg Oral Daily   atorvastatin  80 mg Oral Daily   carvedilol  37.5 mg Oral BID WC   clopidogrel  75 mg Oral Daily   ezetimibe  10 mg Oral Daily   furosemide  40 mg Oral BID   insulin aspart  0-15 Units Subcutaneous TID WC   insulin glargine  8 Units Subcutaneous QHS   nitroGLYCERIN  0.4 mg Transdermal Q2000   sacubitril-valsartan  1 tablet Oral BID   spironolactone  25 mg Oral Daily   Continuous Infusions:  heparin 1,450 Units/hr (11/08/23 0640)   PRN Meds: acetaminophen, nitroGLYCERIN, ondansetron (ZOFRAN) IV, mouth rinse   Vital Signs    Vitals:   11/07/23 1215 11/07/23 1539 11/07/23 1951 11/08/23 0544  BP: (!) 129/97 120/85 (!) 130/90 131/83  Pulse: 77 80 82 77  Resp: 17 17 18 18   Temp: 97.8 F (36.6 C) 97.6 F (36.4 C) 97.8 F (36.6 C) 98.1 F (36.7 C)  TempSrc: Oral Oral Oral Oral  SpO2: 98% 99% 98% 99%  Weight:    (!) 136.9 kg  Height:        Intake/Output Summary (Last 24 hours) at 11/08/2023 0753 Last data filed at 11/07/2023 1300 Gross per 24 hour  Intake 240 ml  Output --  Net 240 ml      11/08/2023    5:44 AM 11/07/2023    4:35 AM 11/06/2023    6:23 PM  Last 3 Weights  Weight (lbs) 301 lb 12.8 oz 301 lb 3.2 oz 305 lb 4.8 oz  Weight (kg) 136.896 kg 136.623 kg 138.483 kg      Telemetry    NSR 80's, no ventricular abnormailities- Personally Reviewed  ECG    NSR, HR 78, chronic nonspecific ST/T changes, QTc prolonged 494 - Personally Reviewed  Physical Exam   GEN: Laying in bed with No acute distress.   Neck: No JVD Cardiac: RRR, no murmurs, rubs, or gallops; 2+ radial pulse Respiratory: Clear to auscultation bilaterally. GI: Soft, nontender,  non-distended  MS: No edema; No deformity. Neuro:  Nonfocal  Psych: Normal affect   Labs    High Sensitivity Troponin:   Recent Labs  Lab 11/06/23 0442 11/06/23 0613 11/06/23 2303 11/07/23 0432  TROPONINIHS 84* 92* 103* 77*     Chemistry Recent Labs  Lab 11/06/23 0442 11/06/23 2303 11/07/23 0432 11/08/23 0442  NA 136  --  135 134*  K 4.0  --  3.6 3.8  CL 103  --  102 103  CO2 24  --  24 25  GLUCOSE 295*  --  287* 360*  BUN 20  --  22* 25*  CREATININE 1.18*  --  1.23* 1.31*  CALCIUM 8.5*  --  8.7* 8.0*  PROT  --  6.3*  --  6.3*  ALBUMIN  --  2.5*  --  2.5*  AST  --  14*  --  13*  ALT  --  13  --  12  ALKPHOS  --  88  --  96  BILITOT  --  0.6  --  0.2  GFRNONAA >60  --  58* 54*  ANIONGAP 9  --  9  6    Lipids  Recent Labs  Lab 11/07/23 0432  CHOL 177  TRIG 125  HDL 43  LDLCALC 109*  CHOLHDL 4.1    Hematology Recent Labs  Lab 11/06/23 0442 11/08/23 0442  WBC 12.5* 9.0  RBC 4.26 3.92  HGB 12.4 11.4*  HCT 37.3 34.0*  MCV 87.6 86.7  MCH 29.1 29.1  MCHC 33.2 33.5  RDW 13.1 13.0  PLT 355 325   Thyroid  Recent Labs  Lab 11/06/23 2303  TSH 4.486    BNP Recent Labs  Lab 11/06/23 0442  BNP 174.7*    DDimer No results for input(s): "DDIMER" in the last 168 hours.   Radiology    ECHOCARDIOGRAM COMPLETE Result Date: 11/07/2023    ECHOCARDIOGRAM REPORT   Patient Name:   Lauren Delgado Date of Exam: 11/07/2023 Medical Rec #:  [id number]          Height:       62.0 in Accession #:    [id number]         Weight:       301.2 lb Date of Birth:  04/11/1986           BSA:          2.275 m Patient Age:    38 years           BP:           134/77 mmHg Patient Gender: F                  HR:           77 bpm. Exam Location:  Inpatient Procedure: 2D Echo, Color Doppler and Cardiac Doppler (Both Spectral and Color            Flow Doppler were utilized during procedure). Indications:    Chest Pain  History:        Patient has prior history of Echocardiogram  examinations. CAD;                 Risk Factors:Hypertension and Diabetes.  Sonographer:    Lamont Snowball Referring Phys: [id number] SAGAR H JINWALA IMPRESSIONS  1. Left ventricular ejection fraction, by estimation, is 45%. The left ventricle has mildly decreased function. Left ventricular diastolic parameters are consistent with Grade I diastolic dysfunction (impaired relaxation). Elevated left atrial pressure.  2. Right ventricular systolic function is normal. The right ventricular size is normal.  3. Mild mitral valve regurgitation.  4. The aortic valve is tricuspid. Aortic valve regurgitation is not visualized.  5. The inferior vena cava is normal in size with greater than 50% respiratory variability, suggesting right atrial pressure of 3 mmHg. Comparison(s): The left ventricular function is unchanged. FINDINGS  Left Ventricle: Left ventricular ejection fraction, by estimation, is 45%. The left ventricle has mildly decreased function. The left ventricular internal cavity size was normal in size. There is no left ventricular hypertrophy. Left ventricular diastolic parameters are consistent with Grade I diastolic dysfunction (impaired relaxation). Elevated left atrial pressure. Right Ventricle: The right ventricular size is normal. Right vetricular wall thickness was not assessed. Right ventricular systolic function is normal. Left Atrium: Left atrial size was normal in size. Right Atrium: Right atrial size was normal in size. Pericardium: Trivial pericardial effusion is present. Mitral Valve: There is mild thickening of the mitral valve leaflet(s). Mild mitral annular calcification. Mild mitral valve regurgitation. MV peak gradient, 3.7 mmHg. The mean mitral valve gradient is 2.0 mmHg. Tricuspid Valve: The  tricuspid valve is normal in structure. Tricuspid valve regurgitation is trivial. Aortic Valve: The aortic valve is tricuspid. Aortic valve regurgitation is not visualized. Pulmonic Valve: The pulmonic valve was  normal in structure. Pulmonic valve regurgitation is not visualized. Aorta: The aortic root and ascending aorta are structurally normal, with no evidence of dilitation. Venous: The inferior vena cava is normal in size with greater than 50% respiratory variability, suggesting right atrial pressure of 3 mmHg. IAS/Shunts: No atrial level shunt detected by color flow Doppler.  LEFT VENTRICLE PLAX 2D LVIDd:         5.00 cm   Diastology LVIDs:         4.00 cm   LV e' medial:    4.90 cm/s LV PW:         0.80 cm   LV E/e' medial:  15.1 LV IVS:        0.70 cm   LV e' lateral:   4.13 cm/s LVOT diam:     2.10 cm   LV E/e' lateral: 17.9 LVOT Area:     3.46 cm  RIGHT VENTRICLE             IVC RV S prime:     14.70 cm/s  IVC diam: 1.30 cm TAPSE (M-mode): 1.8 cm LEFT ATRIUM             Index        RIGHT ATRIUM           Index LA Vol (A2C):   54.8 ml 24.09 ml/m  RA Area:     12.30 cm LA Vol (A4C):   59.4 ml 26.11 ml/m  RA Volume:   29.40 ml  12.92 ml/m LA Biplane Vol: 56.9 ml 25.01 ml/m   AORTA Ao Root diam: 2.70 cm Ao Asc diam:  2.80 cm MITRAL VALVE MV Area (PHT): 3.34 cm     SHUNTS MV Peak grad:  3.7 mmHg     Systemic Diam: 2.10 cm MV Mean grad:  2.0 mmHg MV Vmax:       0.96 m/s MV Vmean:      67.5 cm/s MV Decel Time: 227 msec MV E velocity: 74.10 cm/s MV A velocity: 103.00 cm/s MV E/A ratio:  0.72 Dietrich Pates MD Electronically signed by Dietrich Pates MD Signature Date/Time: 11/07/2023/2:25:25 PM    Final     Cardiac Studies   ECHO 11/07/23 IMPRESSIONS   1. Left ventricular ejection fraction, by estimation, is 45%. The left  ventricle has mildly decreased function. Left ventricular diastolic  parameters are consistent with Grade I diastolic dysfunction (impaired  relaxation). Elevated left atrial pressure.   2. Right ventricular systolic function is normal. The right ventricular  size is normal.   3. Mild mitral valve regurgitation.   4. The aortic valve is tricuspid. Aortic valve regurgitation is not  visualized.    5. The inferior vena cava is normal in size with greater than 50%  respiratory variability, suggesting right atrial pressure of 3 mmHg.   Comparison(s): The left ventricular function is unchanged.   Patient Profile     38 y.o. female  with a hx of HTN, DM2, HLD, morbid obesity, CAD with multivessel PCI 11/2017 with DES to LAD, ramus, and RCA for NSTEMI, chronic HFmrEF who is being seen 11/06/2023 for the evaluation of chest pain at the request of Dr Austin Miles.   Assessment & Plan    CAD s/p PCI 11/2017 with DES to LAD, ramus, and RCA for NSTEMI  Chest pain   - 11/2017 cath: prox to mid LAD 99%, ramus 80%, RCA prox 80% and distal 70%.  DES to LAD, DES to ramus, DES to RCA  - hsTN  84 > 92 > 103 >77 - EKG 11/06/23: ST, HR 104, wandering baseline, chronic nonspecific ST/T changes,  ms, QTc prolonged 505 ms  - EKG 11/08/23: NSR, HR 78, chronic nonspecific ST/T changes, QTc prolonged 494 - EKG this am, stable with no ischemic or dynamic changes. - ECHO 11/07/23: LVEF 45%. LV wall function is stable  - this am, denies any CP, SOB or dizziness. Able to walk to bathroom without any complaints.  - Currently on Hep gtt, NTG patch, Plavix 75 mg, Lipitor 80 mg, carvedilol 37.5 mg BID, Entresto 24/26 mg BID, spironolactone 25 mg, amlodipine 10 mg, furosemide 40 mg PO BID - Based on EKG and ECHO being stable, low threshold for additional ischemic workup at this time. If she develops recurrent symptoms may need definitive repeat cardiac catheterization. - D/c Heparin and NTG patch. Restart NTG 0.4 prn. Can resume all medications above. Restart & Increase Imdur 60 mg. Also adding Asprin 81 mg.  - Encouraged lifestyle modification including healthy diet, exercise, and cessation of smoking/drinking    HTN  - Presented w/ 170's/110's. This am currently 131/83 - currently on home medication carvedilol 37.5 mg BID, Entresto 24/26 mg BID, spironolactone 25 mg, amlodipine 10 mg   HFmrEF - 03/2022 echo: LVEF 45-50%, LV  mild decreased function, grade II dd, mild MVR, normal RV - 11/07/2023: LVEF 45.%. LV mild decreased function. G1DD. Mild MVR.  - Based on stable ECHO in comparison to previous and no ischemic complaints, low threshold for ischemic workup at this time. Can consider in the future if changes arise - CXR no acute process, BNP 174 - current GDMT: Entresto 24/26 mg BID, carvedilol 37.5 mg, spironolactone 25 mg - Resume Jardiance 25 mg  - Continue furosemide 40 mg PO BID - net I/O: +576. Wt: 305 > 301. Suspect inaccurate I/O documentation   HLD  - LDL: 109, goal of <55, Currently on Lipitor 80 mg and Zetia 10mg   - Follow up in 6-8 weeks to check lipids and LFT's  - Will need outpatient lipid clinic referral    DM2 - A1C 9.9  - Managed by primary    CKD stage II - Baseline creatinine around 1-1.1.   -  1.18 > 1.23 > 1.31   Class III obesity  - BMI 55.84  - Currently on mounjaro 5 mg weekly      For questions or updates, please contact [facility] Please consult [email or web address] for contact info under        Signed, Basilio Cairo, PA-C  11/08/2023, 7:53 AM     Patient seen and examined. Agree with assessment and plan.  Remains asymptomatic.  She is now on increased medical therapy regimen with titration of carvedilol to 37.5 mg twice a day yesterday.  Agree with plans to do oral isosorbide mononitrate and DC patch.  Blood pressure improved, heart rate improved now the 70s.  Needs improved diabetic management.  Aim for target LDL less than 55 with lipid management.   Lennette Bihari, MD, [facility] 11/08/2023 10:22 AM

## 2023-11-08 NOTE — Plan of Care (Signed)
  Problem: Health Behavior/Discharge Planning: Goal: Ability to manage health-related needs will improve Outcome: Progressing   Problem: Clinical Measurements: Goal: Respiratory complications will improve Outcome: Progressing Goal: Cardiovascular complication will be avoided Outcome: Progressing   Problem: Activity: Goal: Risk for activity intolerance will decrease Outcome: Progressing   Problem: Nutrition: Goal: Adequate nutrition will be maintained Outcome: Progressing   Problem: Elimination: Goal: Will not experience complications related to urinary retention Outcome: Progressing   Problem: Safety: Goal: Ability to remain free from injury will improve Outcome: Progressing   Problem: Activity: Goal: Ability to tolerate increased activity will improve Outcome: Progressing   Problem: Cardiac: Goal: Ability to achieve and maintain adequate cardiovascular perfusion will improve Outcome: Progressing
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
## 2023-11-10 LAB — LIPOPROTEIN A (LPA): Lipoprotein (a): 44.3 nmol/L — ABNORMAL HIGH (ref <75.0)

## 2023-11-19 ENCOUNTER — Other Ambulatory Visit (HOSPITAL_COMMUNITY): Payer: Self-pay

## 2023-11-19 ENCOUNTER — Other Ambulatory Visit: Payer: Self-pay | Admitting: Emergency Medicine

## 2023-11-20 ENCOUNTER — Encounter: Payer: Self-pay | Admitting: Nurse Practitioner

## 2023-11-20 ENCOUNTER — Other Ambulatory Visit: Payer: Self-pay

## 2023-11-20 ENCOUNTER — Ambulatory Visit: Payer: Medicaid Other | Attending: Nurse Practitioner | Admitting: Nurse Practitioner

## 2023-11-20 ENCOUNTER — Other Ambulatory Visit (HOSPITAL_COMMUNITY): Payer: Self-pay

## 2023-11-20 ENCOUNTER — Ambulatory Visit: Payer: Medicaid Other | Admitting: Emergency Medicine

## 2023-11-20 VITALS — BP 148/88 | HR 82 | Ht 62.0 in | Wt 310.0 lb

## 2023-11-20 DIAGNOSIS — Z8673 Personal history of transient ischemic attack (TIA), and cerebral infarction without residual deficits: Secondary | ICD-10-CM

## 2023-11-20 DIAGNOSIS — E118 Type 2 diabetes mellitus with unspecified complications: Secondary | ICD-10-CM

## 2023-11-20 DIAGNOSIS — I10 Essential (primary) hypertension: Secondary | ICD-10-CM | POA: Diagnosis not present

## 2023-11-20 DIAGNOSIS — Z794 Long term (current) use of insulin: Secondary | ICD-10-CM

## 2023-11-20 DIAGNOSIS — R42 Dizziness and giddiness: Secondary | ICD-10-CM | POA: Diagnosis not present

## 2023-11-20 DIAGNOSIS — I2510 Atherosclerotic heart disease of native coronary artery without angina pectoris: Secondary | ICD-10-CM | POA: Diagnosis not present

## 2023-11-20 DIAGNOSIS — E785 Hyperlipidemia, unspecified: Secondary | ICD-10-CM | POA: Diagnosis not present

## 2023-11-20 DIAGNOSIS — I5042 Chronic combined systolic (congestive) and diastolic (congestive) heart failure: Secondary | ICD-10-CM

## 2023-11-20 MED ORDER — NITROGLYCERIN 0.4 MG SL SUBL
0.4000 mg | SUBLINGUAL_TABLET | SUBLINGUAL | 1 refills | Status: DC | PRN
  Filled 2023-11-20: qty 75, 30d supply, fill #0
  Filled 2024-02-21: qty 75, 30d supply, fill #1

## 2023-11-20 NOTE — Patient Instructions (Addendum)
 Medication Instructions:  Take Lipitor, Zetia & Amlodipine at night as directed.  *If you need a refill on your cardiac medications before your next appointment, please call your pharmacy*  Lab Work: NONE ordered at this time of appointment   Testing/Procedures: NONE ordered at this time of appointment   Follow-Up: At [facility], you and your health needs are our priority.  As part of our continuing mission to provide you with exceptional heart care, we have created designated Provider Care Teams.  These Care Teams include your primary Cardiologist (physician) and Advanced Practice Providers (APPs -  Physician Assistants and Nurse Practitioners) who all work together to provide you with the care you need, when you need it.  We recommend signing up for the patient portal called "MyChart".  Sign up information is provided on this After Visit Summary.  MyChart is used to connect with patients for Virtual Visits (Telemedicine).  Patients are able to view lab/test results, encounter notes, upcoming appointments, etc.  Non-urgent messages can be sent to your provider as well.   To learn more about what you can do with MyChart, go to [email or web address].    Your next appointment:   6-8 week(s)  Provider:   Bernadene Person, NP        Other Instructions   1st Floor: - Lobby - Registration  - Pharmacy  - Lab - Cafe  2nd Floor: - PV Lab - Diagnostic Testing (echo, CT, nuclear med)  3rd Floor: - Vacant  4th Floor: - TCTS (cardiothoracic surgery) - AFib Clinic - Structural Heart Clinic - Vascular Surgery  - Vascular Ultrasound  5th Floor: - HeartCare Cardiology (general and EP) - Clinical Pharmacy for coumadin, hypertension, lipid, weight-loss medications, and med management appointments    Valet parking services will be available as well.

## 2023-11-20 NOTE — Progress Notes (Signed)
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 and sensation are intact. Psych: Normal affect.  Accessory Clinical Findings    ECG personally reviewed by me today -    - no EKG in office today.    Lab Results  Component Value Date   WBC 9.0 11/08/2023   HGB 11.4 (L) 11/08/2023   HCT 34.0 (L) 11/08/2023   MCV 86.7 11/08/2023   PLT 325 11/08/2023   Lab Results  Component Value Date   CREATININE 1.31 (H) 11/08/2023   BUN 25 (H) 11/08/2023   NA 134 (L) 11/08/2023   K 3.8 11/08/2023   CL 103 11/08/2023   CO2 25 11/08/2023   Lab Results  Component Value Date   ALT 12 11/08/2023   AST 13 (L) 11/08/2023   ALKPHOS 96 11/08/2023   BILITOT 0.2 11/08/2023   Lab Results  Component Value Date   CHOL 177 11/07/2023   HDL 43 11/07/2023   LDLCALC 109 (H) 11/07/2023   TRIG 125 11/07/2023   CHOLHDL 4.1 11/07/2023    Lab Results  Component Value Date   HGBA1C 9.9 (H) 11/06/2023    Assessment & Plan    1. CAD: CAD s/p NSTEMI, DESx 4 (p-mLAD, Ramus, pRCA) in 2019. Myoview in January 2021 showed EF 36%, intermediate risk study with apical scar and  moderate anterior apical ischemia with this scarring of anterior septum. Medical management was advised.  Recent hospitalization in the setting of NSTEMI.  Echocardiogram unchanged as above.  Ischemic evaluation was deferred.  Imdur was increased to 60 mg daily.  She notes rare fleeting chest discomfort at rest, denies any exertional symptoms.  Reviewed ED precautions.  She will notify us if she has worsening chest pain.  Should she have ongoing symptoms, consider need for further ischemic evaluation (possible repeat cardiac catheterization).  Continue aspirin, Plavix, carvedilol, amlodipine, Entresto, spironolactone, Jardiance, Lasix, Imdur, Lipitor, and Zetia.  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 Most recent echo in 11/2023 showed EF 45%, mildly decreased LV function, G1 DD, elevated left atrial pressure, normal RV systolic function, mild mitral valve regurgitation, no significant change from prior echo.  Euvolemic and well compensated on exam.  She notes intermittent dizziness/lightheadedness.  She believes that this is a side effect of one of her medications.  Of note, her [place] Pepper was held during her recent hospitalization, her dizziness improved at this time but has since returned.  She does not experience dizziness following her evening dose of Entresto or carvedilol.  We discussed adjusting the timing of her medications.  I encouraged her to take her amlodipine in the evenings.  May need to consider further medication adjustments if dizziness persists.  She has follow-up scheduled with the advanced heart failure TOC clinic.  Can reassess at that time.  Continue carvedilol, Entresto, spironolactone, Jardiance, and Lasix.   3. Lightheadedness: Cardiac monitor was unremarkable. Most recent echo as above. CT of the neck in January 2023 showed less than 50% stenosis of R ICA, not considered hemodynamically significant.  I advised her to begin  taking her amlodipine in the evenings.  If her  dizziness/lightheadedness persist, may need to consider further medication adjustments (see #2).    4. Hypertension: BP elevated in office today, generally well-controlled.  She did not take her medications this morning. Continue current antihypertensive regimen.   5. Hyperlipidemia: LDL was 109 in 11/2023, above goal.  Recently started on Zetia.  Will plan for fasting lipids, LFTs in 6 to 8 weeks.  Continue Lipitor, Zetia.  6. H/o CVA: Hospitalized 08/2021 in the setting of CVA. Unknown source.  Bubble study was negative for PFO. TEE without cardiac source of embolism.  30-day event monitor showed normal sinus rhythm, no signs of atrial fibrillation or other arrhythmia. Following with neurology.  Continue Plavix, Lipitor.  7. Type 2 diabetes/obesity: A1c was 9.9 in 11/2023. Monitored and managed per PCP.    8. Disposition: Follow-up as scheduled with advanced heart failure TOC clinic, follow-up in 6 to 8 weeks with APP.  HYPERTENSION CONTROL Vitals:   11/20/23 1117 11/20/23 1144  BP: (!) 156/92 (!) 148/88    The patient's blood pressure is elevated above target today.  In order to address the patient's elevated BP: Blood pressure will be monitored at home to determine if medication changes need to be made.; Follow up with general cardiology has been recommended.      Joylene Grapes, NP 11/20/2023, 7:28 PM

## 2023-11-22 ENCOUNTER — Other Ambulatory Visit (HOSPITAL_COMMUNITY): Payer: Self-pay

## 2023-11-22 ENCOUNTER — Telehealth (HOSPITAL_COMMUNITY): Payer: Self-pay

## 2023-11-22 MED ORDER — BD PEN NEEDLE NANO 2ND GEN 32G X 4 MM MISC
0 refills | Status: AC
  Filled 2023-11-22: qty 100, 25d supply, fill #0

## 2023-11-22 MED ORDER — FUROSEMIDE 40 MG PO TABS
40.0000 mg | ORAL_TABLET | Freq: Two times a day (BID) | ORAL | 0 refills | Status: DC
  Filled 2023-11-22: qty 30, 15d supply, fill #0

## 2023-11-22 NOTE — Telephone Encounter (Signed)
 Called to confirm/remind patient of their appointment at the [facility] on 11/25/2023 11:45.   Appointment:   [] Confirmed  [x] Left mess   [] No answer/No voice mail  [] Phone not in service  Patient reminded to bring all medications and/or complete list.  Confirmed patient has transportation. Gave directions, instructed to utilize valet parking.
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
## 2023-11-25 ENCOUNTER — Encounter (HOSPITAL_COMMUNITY): Payer: Self-pay

## 2023-11-25 ENCOUNTER — Other Ambulatory Visit: Payer: Self-pay

## 2023-11-25 ENCOUNTER — Ambulatory Visit (HOSPITAL_COMMUNITY)
Admit: 2023-11-25 | Discharge: 2023-11-25 | Disposition: A | Discharge status: Home / Self Care | Attending: Family Medicine | Admitting: Family Medicine

## 2023-11-25 ENCOUNTER — Other Ambulatory Visit (HOSPITAL_COMMUNITY): Payer: Self-pay

## 2023-11-25 VITALS — BP 165/88 | HR 80 | Ht 62.0 in | Wt 313.4 lb

## 2023-11-25 DIAGNOSIS — Z5986 Financial insecurity: Secondary | ICD-10-CM | POA: Insufficient documentation

## 2023-11-25 DIAGNOSIS — Z6841 Body Mass Index (BMI) 40.0 and over, adult: Secondary | ICD-10-CM | POA: Diagnosis not present

## 2023-11-25 DIAGNOSIS — Z955 Presence of coronary angioplasty implant and graft: Secondary | ICD-10-CM | POA: Insufficient documentation

## 2023-11-25 DIAGNOSIS — I5020 Unspecified systolic (congestive) heart failure: Secondary | ICD-10-CM | POA: Diagnosis not present

## 2023-11-25 DIAGNOSIS — Z7902 Long term (current) use of antithrombotics/antiplatelets: Secondary | ICD-10-CM | POA: Insufficient documentation

## 2023-11-25 DIAGNOSIS — I10 Essential (primary) hypertension: Secondary | ICD-10-CM

## 2023-11-25 DIAGNOSIS — I5042 Chronic combined systolic (congestive) and diastolic (congestive) heart failure: Secondary | ICD-10-CM | POA: Diagnosis not present

## 2023-11-25 DIAGNOSIS — E785 Hyperlipidemia, unspecified: Secondary | ICD-10-CM | POA: Insufficient documentation

## 2023-11-25 DIAGNOSIS — I252 Old myocardial infarction: Secondary | ICD-10-CM | POA: Insufficient documentation

## 2023-11-25 DIAGNOSIS — Z7985 Long-term (current) use of injectable non-insulin antidiabetic drugs: Secondary | ICD-10-CM | POA: Diagnosis not present

## 2023-11-25 DIAGNOSIS — Z7984 Long term (current) use of oral hypoglycemic drugs: Secondary | ICD-10-CM | POA: Insufficient documentation

## 2023-11-25 DIAGNOSIS — I110 Hypertensive heart disease with heart failure: Secondary | ICD-10-CM | POA: Insufficient documentation

## 2023-11-25 DIAGNOSIS — E1165 Type 2 diabetes mellitus with hyperglycemia: Secondary | ICD-10-CM | POA: Diagnosis not present

## 2023-11-25 DIAGNOSIS — Z5982 Transportation insecurity: Secondary | ICD-10-CM | POA: Insufficient documentation

## 2023-11-25 DIAGNOSIS — I5022 Chronic systolic (congestive) heart failure: Secondary | ICD-10-CM

## 2023-11-25 DIAGNOSIS — Z8249 Family history of ischemic heart disease and other diseases of the circulatory system: Secondary | ICD-10-CM | POA: Insufficient documentation

## 2023-11-25 DIAGNOSIS — Z79899 Other long term (current) drug therapy: Secondary | ICD-10-CM | POA: Insufficient documentation

## 2023-11-25 DIAGNOSIS — I2510 Atherosclerotic heart disease of native coronary artery without angina pectoris: Secondary | ICD-10-CM | POA: Diagnosis not present

## 2023-11-25 DIAGNOSIS — Z794 Long term (current) use of insulin: Secondary | ICD-10-CM | POA: Diagnosis not present

## 2023-11-25 LAB — BASIC METABOLIC PANEL
Anion gap: 6 (ref 5–15)
BUN: 22 mg/dL — ABNORMAL HIGH (ref 6–20)
CO2: 25 mmol/L (ref 22–32)
Calcium: 8.9 mg/dL (ref 8.9–10.3)
Chloride: 104 mmol/L (ref 98–111)
Creatinine, Ser: 1.18 mg/dL — ABNORMAL HIGH (ref 0.44–1.00)
GFR, Estimated: >60 mL/min (ref >60)
Glucose, Bld: 314 mg/dL — ABNORMAL HIGH (ref 70–99)
Potassium: 4 mmol/L (ref 3.5–5.1)
Sodium: 135 mmol/L (ref 135–145)

## 2023-11-25 LAB — BRAIN NATRIURETIC PEPTIDE: B Natriuretic Peptide: 292.3 pg/mL — ABNORMAL HIGH (ref 0.0–100.0)

## 2023-11-25 MED ORDER — ISOSORBIDE MONONITRATE ER 60 MG PO TB24
60.0000 mg | ORAL_TABLET | Freq: Every day | ORAL | 5 refills | Status: DC
  Filled 2023-11-25 – 2023-12-02 (×2): qty 30, 30d supply, fill #0
  Filled 2024-01-05: qty 30, 30d supply, fill #1

## 2023-11-25 MED ORDER — SPIRONOLACTONE 25 MG PO TABS
25.0000 mg | ORAL_TABLET | Freq: Every day | ORAL | 3 refills | Status: AC
  Filled 2023-11-25: qty 90, 90d supply, fill #0
  Filled 2024-02-21: qty 90, 90d supply, fill #1
  Filled 2024-03-19 – 2024-05-14 (×2): qty 90, 90d supply, fill #2
  Filled 2024-08-23: qty 90, 90d supply, fill #3

## 2023-11-25 MED ORDER — EZETIMIBE 10 MG PO TABS
10.0000 mg | ORAL_TABLET | Freq: Every day | ORAL | 3 refills | Status: DC
  Filled 2023-11-25 – 2023-12-02 (×2): qty 90, 90d supply, fill #0

## 2023-11-25 MED ORDER — ATORVASTATIN CALCIUM 80 MG PO TABS
80.0000 mg | ORAL_TABLET | Freq: Every day | ORAL | 3 refills | Status: DC
  Filled 2023-11-25: qty 90, 90d supply, fill #0
  Filled 2024-02-21: qty 90, 90d supply, fill #1
  Filled 2024-03-19: qty 90, 90d supply, fill #2

## 2023-11-25 MED ORDER — EMPAGLIFLOZIN 10 MG PO TABS
10.0000 mg | ORAL_TABLET | Freq: Every day | ORAL | 3 refills | Status: DC
  Filled 2023-11-25: qty 90, 90d supply, fill #0
  Filled 2024-02-21: qty 90, 90d supply, fill #1
  Filled 2024-03-19: qty 90, 90d supply, fill #2

## 2023-11-25 MED ORDER — ENTRESTO 97-103 MG PO TABS
1.0000 | ORAL_TABLET | Freq: Two times a day (BID) | ORAL | 3 refills | Status: AC
  Filled 2023-11-25: qty 180, 90d supply, fill #0
  Filled 2024-02-21: qty 180, 90d supply, fill #1
  Filled 2024-03-19 – 2024-07-23 (×2): qty 180, 90d supply, fill #2

## 2023-11-25 MED ORDER — FUROSEMIDE 40 MG PO TABS
40.0000 mg | ORAL_TABLET | Freq: Two times a day (BID) | ORAL | 3 refills | Status: AC
  Filled 2023-11-25 – 2023-12-12 (×3): qty 180, 90d supply, fill #0
  Filled 2024-02-21: qty 180, 90d supply, fill #1
  Filled 2024-03-19 – 2024-06-16 (×3): qty 180, 90d supply, fill #2
  Filled 2024-08-14 – 2024-08-25 (×2): qty 180, 90d supply, fill #3

## 2023-11-25 MED ORDER — CLOPIDOGREL BISULFATE 75 MG PO TABS
75.0000 mg | ORAL_TABLET | Freq: Every day | ORAL | 3 refills | Status: AC
  Filled 2023-11-25: qty 90, 90d supply, fill #0
  Filled 2024-02-21: qty 90, 90d supply, fill #1
  Filled 2024-03-19 – 2024-05-14 (×2): qty 90, 90d supply, fill #2
  Filled 2024-06-16 – 2024-08-23 (×2): qty 90, 90d supply, fill #3

## 2023-11-25 MED ORDER — CARVEDILOL 12.5 MG PO TABS
37.5000 mg | ORAL_TABLET | Freq: Two times a day (BID) | ORAL | 3 refills | Status: DC
  Filled 2023-11-25 – 2023-12-02 (×2): qty 180, 30d supply, fill #0
  Filled 2024-01-05: qty 180, 30d supply, fill #1
  Filled 2024-02-21: qty 180, 30d supply, fill #2
  Filled 2024-03-19: qty 180, 30d supply, fill #3

## 2023-11-25 NOTE — Patient Instructions (Signed)
 Medication Changes:  INCREASE ENTRESTO TO 97/103MG  TWICE DAILY   STOP AMLODIPINE   RESTART: JARDIANCE 10 MG ONCE DAILY   **PLEASE CALL us IF YOU ARE GETTING DIZZY** [phone] OPT 2  Lab Work:  Labs done today, your results will be available in MyChart, we will contact you for abnormal readings.  Special Instructions // Education:  CHECK BLOOD PRESSURE DAILY- WRITE THIS NUMBER DOWN ON A LOG AND BRING TO NEXT APPOINTMENT   WEIGH YOURSELF DAILY WITH SCALE PROVIDED   Follow-Up in: 2-3 WEEKS AS SCHEDULED   At the [facility], you and your health needs are our priority. We have a designated team specialized in the treatment of Heart Failure. This Care Team includes your primary Heart Failure Specialized Cardiologist (physician), Advanced Practice Providers (APPs- Physician Assistants and Nurse Practitioners), and Pharmacist who all work together to provide you with the care you need, when you need it.   You may see any of the following providers on your designated Care Team at your next follow up:  Dr. Arvilla Meres Dr. Marca Ancona Dr. Dorthula Nettles Dr. Theresia Bough Tonye Becket, NP Robbie Lis, [place] [facility] [place], [place] Brynda Peon, NP [place] Lee, NP Karle Plumber, PharmD   Please be sure to bring in all your medications bottles to every appointment.   Need to Contact us:  If you have any questions or concerns before your next appointment please send [place] a message through [place] or call our office at [phone].    TO LEAVE A MESSAGE FOR THE NURSE SELECT OPTION 2, PLEASE LEAVE A MESSAGE INCLUDING: YOUR NAME DATE OF BIRTH CALL BACK NUMBER REASON FOR CALL**this is important as we prioritize the call backs  YOU WILL RECEIVE A CALL BACK THE SAME DAY AS LONG AS YOU CALL BEFORE 4:00 PM

## 2023-11-26 ENCOUNTER — Other Ambulatory Visit: Payer: Self-pay

## 2023-12-02 ENCOUNTER — Ambulatory Visit (INDEPENDENT_AMBULATORY_CARE_PROVIDER_SITE_OTHER): Admitting: Emergency Medicine

## 2023-12-02 ENCOUNTER — Other Ambulatory Visit (HOSPITAL_COMMUNITY): Payer: Self-pay

## 2023-12-02 ENCOUNTER — Other Ambulatory Visit: Payer: Self-pay

## 2023-12-02 ENCOUNTER — Encounter: Payer: Self-pay | Admitting: Emergency Medicine

## 2023-12-02 VITALS — BP 122/82 | HR 78 | Temp 97.8°F | Ht 62.0 in | Wt 309.0 lb

## 2023-12-02 DIAGNOSIS — I429 Cardiomyopathy, unspecified: Secondary | ICD-10-CM

## 2023-12-02 DIAGNOSIS — E1159 Type 2 diabetes mellitus with other circulatory complications: Secondary | ICD-10-CM | POA: Diagnosis not present

## 2023-12-02 DIAGNOSIS — Z8673 Personal history of transient ischemic attack (TIA), and cerebral infarction without residual deficits: Secondary | ICD-10-CM | POA: Diagnosis not present

## 2023-12-02 DIAGNOSIS — I152 Hypertension secondary to endocrine disorders: Secondary | ICD-10-CM | POA: Diagnosis not present

## 2023-12-02 DIAGNOSIS — E785 Hyperlipidemia, unspecified: Secondary | ICD-10-CM | POA: Diagnosis not present

## 2023-12-02 DIAGNOSIS — E1169 Type 2 diabetes mellitus with other specified complication: Secondary | ICD-10-CM | POA: Diagnosis not present

## 2023-12-02 DIAGNOSIS — Z7984 Long term (current) use of oral hypoglycemic drugs: Secondary | ICD-10-CM | POA: Diagnosis not present

## 2023-12-02 DIAGNOSIS — I214 Non-ST elevation (NSTEMI) myocardial infarction: Secondary | ICD-10-CM

## 2023-12-02 DIAGNOSIS — Z7985 Long-term (current) use of injectable non-insulin antidiabetic drugs: Secondary | ICD-10-CM

## 2023-12-02 DIAGNOSIS — I5042 Chronic combined systolic (congestive) and diastolic (congestive) heart failure: Secondary | ICD-10-CM

## 2023-12-02 DIAGNOSIS — Z09 Encounter for follow-up examination after completed treatment for conditions other than malignant neoplasm: Secondary | ICD-10-CM

## 2023-12-02 MED ORDER — GLIPIZIDE ER 5 MG PO TB24
5.0000 mg | ORAL_TABLET | Freq: Every day | ORAL | 1 refills | Status: DC
  Filled 2023-12-02: qty 90, 90d supply, fill #0
  Filled 2024-02-21: qty 90, 90d supply, fill #1

## 2023-12-02 MED ORDER — HUMULIN 70/30 KWIKPEN (70-30) 100 UNIT/ML ~~LOC~~ SUPN
15.0000 [IU] | PEN_INJECTOR | Freq: Two times a day (BID) | SUBCUTANEOUS | 5 refills | Status: DC
  Filled 2023-12-02: qty 3, 10d supply, fill #0
  Filled 2023-12-12: qty 3, 10d supply, fill #1
  Filled 2023-12-18 – 2023-12-20 (×2): qty 3, 10d supply, fill #2
  Filled 2024-01-05: qty 3, 10d supply, fill #3
  Filled 2024-02-21: qty 3, 10d supply, fill #4
  Filled 2024-03-13: qty 3, 10d supply, fill #5

## 2023-12-02 MED ORDER — VALACYCLOVIR HCL 500 MG PO TABS
500.0000 mg | ORAL_TABLET | Freq: Two times a day (BID) | ORAL | 1 refills | Status: AC
  Filled 2023-12-02: qty 28, 14d supply, fill #0
  Filled 2023-12-12 – 2023-12-13 (×2): qty 28, 14d supply, fill #1
  Filled 2023-12-26: qty 28, 14d supply, fill #2
  Filled 2024-02-21: qty 28, 14d supply, fill #3
  Filled 2024-03-13: qty 28, 14d supply, fill #4
  Filled 2024-06-16: qty 28, 14d supply, fill #5
  Filled 2024-08-14: qty 28, 14d supply, fill #6
  Filled 2024-09-15: qty 28, 14d supply, fill #7

## 2023-12-02 NOTE — Assessment & Plan Note (Signed)
 Secondary stroke prevention discussed Importance of blood pressure, diabetes, cholesterol control discussed Management discussed Continues daily aspirin and Plavix

## 2023-12-02 NOTE — Assessment & Plan Note (Signed)
 Uncontrolled diabetes with hemoglobin A1c at 9.9 Continues atorvastatin 80 mg daily Diet and nutrition discussed.

## 2023-12-02 NOTE — Patient Instructions (Addendum)

## 2023-12-02 NOTE — Progress Notes (Signed)
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 Cucumbers. Brussels sprouts. Grains Whole grains, such as whole-wheat or whole-grain bread, crackers, tortillas, cereal, and pasta. Unsweetened oatmeal. Quinoa. Brown or wild rice. Meats and other proteins Seafood. Poultry without skin. Lean cuts of poultry and beef. Tofu. Nuts. Seeds. Dairy Low-fat or fat-free dairy products such as milk, yogurt, and cheese. The items listed above may not be a complete list of foods and beverages you can eat and drink. Contact a dietitian for more information. What foods should I avoid? Fruits Fruits canned with syrup. Vegetables Canned vegetables. Frozen vegetables with butter or cream sauce. Grains Refined white flour and flour products such as bread, pasta, snack foods, and cereals. Avoid all processed foods. Meats and other proteins Fatty cuts of meat. Poultry with skin. Breaded or fried meats. Processed meat. Avoid saturated fats. Dairy Full-fat  yogurt, cheese, or milk. Beverages Sweetened drinks, such as soda or iced tea. The items listed above may not be a complete list of foods and beverages you should avoid. Contact a dietitian for more information. Questions to ask a health care provider Do I need to meet with a certified diabetes care and education specialist? Do I need to meet with a dietitian? What number can I call if I have questions? When are the best times to check my blood glucose? Where to find more information: American Diabetes Association: diabetes.org Academy of Nutrition and Dietetics: eatright[employer] of Diabetes and Digestive and Kidney Diseases: StageSync.si Association of Diabetes Care & Education Specialists: diabeteseducator.org Summary It is important to have healthy eating habits because your blood sugar (glucose) levels are greatly affected by what you eat and drink. It is important to use alcohol carefully. A healthy meal plan will help you manage your blood glucose and lower your risk of heart disease. Your health care provider may recommend that you work with a dietitian to make a meal plan that is best for you. This information is not intended to replace advice given to you by your health care provider. Make sure you discuss any questions you have with your health care provider. Document Revised: 03/22/2020 Document Reviewed: 03/23/2020 Elsevier Patient Education  2024 Elsevier Inc.         Edwina Barth, MD [facility]

## 2023-12-02 NOTE — Assessment & Plan Note (Signed)
 Clinically stable.  Positive recent troponins during recent hospital admission Continues isosorbide mononitrate 60 mg daily, carvedilol 37.5 mg twice a day, daily baby aspirin, and Plavix 75 mg daily

## 2023-12-02 NOTE — Assessment & Plan Note (Signed)
 Clinically stable.  Normovolemic. Most recent cardiologist office visit notes reviewed Cardiologist recently increased Entresto to 97-103 twice a day, Lasix 40 mg daily, Jardiance 25 mg daily, Aldactone 25 mg daily

## 2023-12-02 NOTE — Assessment & Plan Note (Signed)
 Well-controlled hypertension Continue carvedilol 25 mg twice a day, Entresto 97-103 twice a day, and Aldactone 25 mg daily.  Amlodipine was recently discontinued Uncontrolled diabetes with hemoglobin A1c of 9.9 Ozempic and Trulicity gave her diarrhea Not tolerating Mounjaro either Presently on NovoLog insulin 70/30 15 units twice a day, Jardiance 25 mg daily and glipizide 5 mg daily Cardiovascular risks associated with uncontrolled diabetes discussed Diet and nutrition discussed Follow-up in 3 months

## 2023-12-03 ENCOUNTER — Encounter (HOSPITAL_COMMUNITY): Payer: Self-pay | Admitting: Emergency Medicine

## 2023-12-03 ENCOUNTER — Other Ambulatory Visit: Payer: Self-pay

## 2023-12-03 ENCOUNTER — Emergency Department (HOSPITAL_COMMUNITY)

## 2023-12-03 ENCOUNTER — Emergency Department (HOSPITAL_COMMUNITY)
Admission: EM | Admit: 2023-12-03 | Discharge: 2023-12-03 | Disposition: A | Discharge status: Home / Self Care | Attending: Emergency Medicine | Admitting: Emergency Medicine

## 2023-12-03 DIAGNOSIS — Z8673 Personal history of transient ischemic attack (TIA), and cerebral infarction without residual deficits: Secondary | ICD-10-CM | POA: Diagnosis not present

## 2023-12-03 DIAGNOSIS — Z7984 Long term (current) use of oral hypoglycemic drugs: Secondary | ICD-10-CM | POA: Insufficient documentation

## 2023-12-03 DIAGNOSIS — E119 Type 2 diabetes mellitus without complications: Secondary | ICD-10-CM | POA: Insufficient documentation

## 2023-12-03 DIAGNOSIS — Z79899 Other long term (current) drug therapy: Secondary | ICD-10-CM | POA: Diagnosis not present

## 2023-12-03 DIAGNOSIS — I110 Hypertensive heart disease with heart failure: Secondary | ICD-10-CM | POA: Insufficient documentation

## 2023-12-03 DIAGNOSIS — Z794 Long term (current) use of insulin: Secondary | ICD-10-CM | POA: Insufficient documentation

## 2023-12-03 DIAGNOSIS — R079 Chest pain, unspecified: Secondary | ICD-10-CM | POA: Insufficient documentation

## 2023-12-03 DIAGNOSIS — Z7902 Long term (current) use of antithrombotics/antiplatelets: Secondary | ICD-10-CM | POA: Insufficient documentation

## 2023-12-03 DIAGNOSIS — I517 Cardiomegaly: Secondary | ICD-10-CM | POA: Diagnosis not present

## 2023-12-03 DIAGNOSIS — R0789 Other chest pain: Secondary | ICD-10-CM | POA: Diagnosis not present

## 2023-12-03 DIAGNOSIS — J849 Interstitial pulmonary disease, unspecified: Secondary | ICD-10-CM | POA: Diagnosis not present

## 2023-12-03 DIAGNOSIS — I2510 Atherosclerotic heart disease of native coronary artery without angina pectoris: Secondary | ICD-10-CM | POA: Diagnosis not present

## 2023-12-03 DIAGNOSIS — Z7982 Long term (current) use of aspirin: Secondary | ICD-10-CM | POA: Insufficient documentation

## 2023-12-03 DIAGNOSIS — I509 Heart failure, unspecified: Secondary | ICD-10-CM | POA: Insufficient documentation

## 2023-12-03 DIAGNOSIS — R42 Dizziness and giddiness: Secondary | ICD-10-CM | POA: Diagnosis not present

## 2023-12-03 LAB — BASIC METABOLIC PANEL WITH GFR
Anion gap: 12 (ref 5–15)
BUN: 23 mg/dL — ABNORMAL HIGH (ref 6–20)
CO2: 27 mmol/L (ref 22–32)
Calcium: 8.6 mg/dL — ABNORMAL LOW (ref 8.9–10.3)
Chloride: 99 mmol/L (ref 98–111)
Creatinine, Ser: 1.31 mg/dL — ABNORMAL HIGH (ref 0.44–1.00)
GFR, Estimated: 54 mL/min — ABNORMAL LOW (ref >60)
Glucose, Bld: 140 mg/dL — ABNORMAL HIGH (ref 70–99)
Potassium: 3.6 mmol/L (ref 3.5–5.1)
Sodium: 138 mmol/L (ref 135–145)

## 2023-12-03 LAB — TROPONIN I (HIGH SENSITIVITY)
Troponin I (High Sensitivity): 8 ng/L (ref <18)
Troponin I (High Sensitivity): 7 ng/L (ref <18)

## 2023-12-03 LAB — CBC
HCT: 35.1 % — ABNORMAL LOW (ref 36.0–46.0)
Hemoglobin: 11.3 g/dL — ABNORMAL LOW (ref 12.0–15.0)
MCH: 29 pg (ref 26.0–34.0)
MCHC: 32.2 g/dL (ref 30.0–36.0)
MCV: 90.2 fL (ref 80.0–100.0)
Platelets: 294 10*3/uL (ref 150–400)
RBC: 3.89 MIL/uL (ref 3.87–5.11)
RDW: 13.4 % (ref 11.5–15.5)
WBC: 8.7 10*3/uL (ref 4.0–10.5)
nRBC: 0 % (ref 0.0–0.2)

## 2023-12-03 LAB — HCG, SERUM, QUALITATIVE: Preg, Serum: NEGATIVE

## 2023-12-03 MED ORDER — NITROGLYCERIN 0.4 MG SL SUBL: 0.4000 mg | SUBLINGUAL_TABLET | SUBLINGUAL | Status: DC | PRN

## 2023-12-03 MED ORDER — ASPIRIN 81 MG PO CHEW
324.0000 mg | CHEWABLE_TABLET | Freq: Once | ORAL | Status: AC
  Administered 2023-12-03: 324 mg via ORAL
  Filled 2023-12-03: qty 4

## 2023-12-03 NOTE — ED Notes (Signed)
 Patient transported to X-ray

## 2023-12-03 NOTE — Discharge Instructions (Signed)
 If you develop recurrent, continued, or worsening chest pain, shortness of breath, fever, vomiting, abdominal or back pain, or any other new/concerning symptoms then return to the ER for evaluation.

## 2023-12-03 NOTE — ED Provider Notes (Signed)
 [place] EMERGENCY DEPARTMENT AT [facility] Provider Note   CSN: [id number] Arrival date & time: 12/03/23  0701     History  Chief Complaint  Patient presents with   Dizziness   Chest Pain   Shortness of Breath    Lauren Delgado is a 38 y.o. female.  HPI 38 year old female presents with chest pain.  She has a history significant for CHF, CAD, stroke, diabetes, hypertension and other comorbidities.  Starting around 6:15 AM she developed chest pain.  It is a sharp pain under her left breast and she feels pain going down her right arm.  This is similar to pain she has had before including last month when she got admitted.  She feels short of breath and has also been nauseated.  She denies any leg swelling, abdominal pain, back pain.  She also felt dizzy though that dizziness has resolved.  She took some nitroglycerin which partially helped her pain and took it from a 10 down to an 8.  No positional or pleuritic symptoms.  Home Medications Prior to Admission medications   Medication Sig Start Date End Date Taking? Authorizing Provider  aspirin EC 81 MG tablet Take 1 tablet (81 mg total) by mouth daily. Swallow whole. 11/09/23 12/09/23 Yes Lanae Boast, MD  atorvastatin (LIPITOR) 80 MG tablet Take 1 tablet (80 mg total) by mouth daily. 11/25/23  Yes [name], Anderson Malta, FNP  carvedilol (COREG) 12.5 MG tablet Take 3 tablets (37.5 mg total) by mouth 2 (two) times daily with a meal. 11/25/23  Yes [name], Anderson Malta, FNP  clopidogrel (PLAVIX) 75 MG tablet Take 1 tablet (75 mg total) by mouth daily. 11/25/23  Yes [name], Anderson Malta, FNP  empagliflozin (JARDIANCE) 10 MG TABS tablet Take 1 tablet (10 mg total) by mouth daily before breakfast. 11/25/23  Yes [name], Anderson Malta, FNP  ezetimibe (ZETIA) 10 MG tablet Take 1 tablet (10 mg total) by mouth daily. 11/25/23  Yes [name], Anderson Malta, FNP  furosemide (LASIX) 40 MG tablet Take 1 tablet (40 mg total) by mouth 2 (two) times daily. 11/25/23  Yes  [name], Anderson Malta, FNP  glipiZIDE (GLUCOTROL XL) 5 MG 24 hr tablet Take 1 tablet (5 mg total) by mouth daily with breakfast. 12/02/23  Yes Sagardia, Eilleen Kempf, MD  insulin isophane & regular human KwikPen (HUMULIN 70/30 KWIKPEN) (70-30) 100 UNIT/ML KwikPen Inject 15 Units into the skin 2 (two) times daily. 12/02/23  Yes Sagardia, Eilleen Kempf, MD  isosorbide mononitrate (IMDUR) 60 MG 24 hr tablet Take 1 tablet (60 mg total) by mouth daily. 11/25/23  Yes [name], Anderson Malta, FNP  nitroGLYCERIN (NITROSTAT) 0.4 MG SL tablet Place 1 tablet (0.4 mg total) under the tongue every 5 (five) minutes as needed for chest pain. 11/20/23 02/18/24 Yes [name], Lisette Abu, MD  sacubitril-valsartan (ENTRESTO) 97-103 MG Take 1 tablet by mouth 2 (two) times daily. 11/25/23  Yes [name], Anderson Malta, FNP  spironolactone (ALDACTONE) 25 MG tablet Take 1 tablet (25 mg total) by mouth daily. 11/25/23  Yes [name], Anderson Malta, FNP  valACYclovir (VALTREX) 500 MG tablet Take 1 tablet (500 mg total) by mouth 2 (two) times daily. Patient taking differently: Take 500 mg by mouth as needed. 12/02/23  Yes Sagardia, Eilleen Kempf, MD  Insulin Pen Needle (BD PEN NEEDLE NANO 2ND GEN) 32G X 4 MM MISC USE TO INJECT INSULIN UP TO 4 TIMES DAILY AS NEEDED 11/22/23   Georgina Quint, MD  tirzepatide [facility]) 5 MG/0.5ML Pen Inject  5 mg into the skin once a week. Patient not taking: Reported on 12/02/2023 08/21/23   Georgina Quint, MD      Allergies    Metformin and related and Ozempic (0.25 or 0.5 mg-dose) [semaglutide(0.25 or 0.5mg -dos)]    Review of Systems   Review of Systems  Respiratory:  Positive for shortness of breath. Negative for cough.   Cardiovascular:  Positive for chest pain. Negative for leg swelling.  Gastrointestinal:  Positive for nausea. Negative for abdominal pain and vomiting.  Musculoskeletal:  Negative for back pain.    Physical Exam Updated Vital Signs BP (!) 103/51   Pulse 64   Temp 97.6 F (36.4 C)    Resp 14   Ht 5\' 2"  (1.575 m)   Wt (!) 140 kg   LMP 11/04/2023   SpO2 100%   BMI 56.45 kg/m  Physical Exam Vitals and nursing note reviewed.  Constitutional:      General: She is not in acute distress.    Appearance: She is well-developed. She is obese. She is not ill-appearing or diaphoretic.  HENT:     Head: Normocephalic and atraumatic.  Cardiovascular:     Rate and Rhythm: Normal rate and regular rhythm.     Heart sounds: Normal heart sounds.  Pulmonary:     Effort: Pulmonary effort is normal.     Breath sounds: Normal breath sounds.  Abdominal:     Palpations: Abdomen is soft.     Tenderness: There is no abdominal tenderness.  Musculoskeletal:     Right lower leg: No edema.     Left lower leg: No edema.  Skin:    General: Skin is warm and dry.  Neurological:     Mental Status: She is alert.     ED Results / Procedures / Treatments   Labs (all labs ordered are listed, but only abnormal results are displayed) Labs Reviewed  BASIC METABOLIC PANEL WITH GFR - Abnormal; Notable for the following components:      Result Value   Glucose, Bld 140 (*)    BUN 23 (*)    Creatinine, Ser 1.31 (*)    Calcium 8.6 (*)    GFR, Estimated 54 (*)    All other components within normal limits  CBC - Abnormal; Notable for the following components:   Hemoglobin 11.3 (*)    HCT 35.1 (*)    All other components within normal limits  HCG, SERUM, QUALITATIVE  TROPONIN I (HIGH SENSITIVITY)  TROPONIN I (HIGH SENSITIVITY)    EKG EKG Interpretation Date/Time:  Tuesday December 03 2023 07:12:11 EDT Ventricular Rate:  77 PR Interval:  132 QRS Duration:  100 QT Interval:  406 QTC Calculation: 459 R Axis:   56  Text Interpretation: Normal sinus rhythm Low voltage QRS Cannot rule out Anterior infarct , age undetermined  ST/T changes similar to Nov 25 2023 Confirmed by Pricilla Loveless [phone]) on 12/03/2023 7:25:59 AM  Radiology DG Chest 2 View Result Date: 12/03/2023 CLINICAL DATA:  Chest  pain with dizziness. EXAM: CHEST - 2 VIEW COMPARISON:  Portable chest 11/06/2023 FINDINGS: The heart is slightly enlarged. There is chronic reticulonodular interstitial lung disease with innumerable diffusely distributed tiny calcified granulomas with relative peripheral sparing. No focal pulmonary infiltrate is seen. No vascular congestion findings. The mediastinum is normally outlined. Thoracic cage is intact. IMPRESSION: 1. No evidence of acute chest disease. 2. Chronic reticulonodular interstitial lung disease with innumerable diffusely distributed tiny calcified granulomas. 3. Mild cardiomegaly.  No evidence  for CHF. Electronically Signed   By: Almira Bar M.D.   On: 12/03/2023 07:57    Procedures Procedures    Medications Ordered in ED Medications  nitroGLYCERIN (NITROSTAT) SL tablet 0.4 mg (has no administration in time range)  aspirin chewable tablet 324 mg (324 mg Oral Given 12/03/23 0802)    ED Course/ Medical Decision Making/ A&P                                 Medical Decision Making Amount and/or Complexity of Data Reviewed External Data Reviewed: notes.    Details: Discharge summary from Mar 2025 Labs: ordered.    Details: Stable creatinine.  Normal troponins x 2. Radiology: ordered and independent interpretation performed.    Details: No CHF ECG/medicine tests: ordered and independent interpretation performed.    Details: Unchanged from baseline  Risk OTC drugs. Prescription drug management.   Patient presents with sharp chest pain under her left breast.  Workup is unremarkable including normal troponins x 2.  My suspicion that she is having ACS today, PE, dissection, etc. is all low.  She is feeling a lot better.  Could have been related to some hypertension that was present on arrival but has now resolved.  She feels well enough for discharge.  Will have her follow-up with cardiology but otherwise I feel she is stable for discharge home with return  precautions.        Final Clinical Impression(s) / ED Diagnoses Final diagnoses:  Nonspecific chest pain    Rx / DC Orders ED Discharge Orders          Ordered    Ambulatory referral to Cardiology       Comments: If you have not heard from the Cardiology office within the next 72 hours please call [phone].   12/03/23 1041              Pricilla Loveless, MD 12/03/23 1042

## 2023-12-03 NOTE — ED Triage Notes (Signed)
 Pt. Stated, I woke up and my chest was hurting and the room was spinning with some SOB. I also feel nausea.  I took a Nitroglycerin around 615 and did not help.

## 2023-12-12 ENCOUNTER — Other Ambulatory Visit: Payer: Self-pay

## 2023-12-12 ENCOUNTER — Ambulatory Visit (HOSPITAL_COMMUNITY)

## 2023-12-12 ENCOUNTER — Other Ambulatory Visit (HOSPITAL_COMMUNITY): Payer: Self-pay

## 2023-12-16 ENCOUNTER — Other Ambulatory Visit (HOSPITAL_COMMUNITY): Payer: Self-pay

## 2023-12-16 ENCOUNTER — Telehealth (HOSPITAL_COMMUNITY): Payer: Self-pay

## 2023-12-16 NOTE — Telephone Encounter (Signed)
 Called to confirm/remind patient of their appointment at [facility] on 12/17/2023 2:00.   Appointment:   [] Confirmed  [x] Left mess   [] No answer/No voice mail  [] Phone not in service  Patient reminded to bring all medications and/or complete list.  Confirmed patient has transportation. Gave directions, instructed to utilize valet parking.

## 2023-12-17 ENCOUNTER — Ambulatory Visit (HOSPITAL_COMMUNITY)

## 2023-12-17 ENCOUNTER — Ambulatory Visit (HOSPITAL_COMMUNITY)
Admission: RE | Admit: 2023-12-17 | Discharge: 2023-12-17 | Disposition: A | Discharge status: Home / Self Care | Source: Ambulatory Visit | Attending: Physician Assistant | Admitting: Physician Assistant

## 2023-12-17 VITALS — BP 102/70 | HR 85 | Wt 314.4 lb

## 2023-12-17 DIAGNOSIS — I252 Old myocardial infarction: Secondary | ICD-10-CM | POA: Insufficient documentation

## 2023-12-17 DIAGNOSIS — I110 Hypertensive heart disease with heart failure: Secondary | ICD-10-CM | POA: Diagnosis not present

## 2023-12-17 DIAGNOSIS — R42 Dizziness and giddiness: Secondary | ICD-10-CM | POA: Diagnosis not present

## 2023-12-17 DIAGNOSIS — I10 Essential (primary) hypertension: Secondary | ICD-10-CM | POA: Diagnosis not present

## 2023-12-17 DIAGNOSIS — Z955 Presence of coronary angioplasty implant and graft: Secondary | ICD-10-CM | POA: Diagnosis not present

## 2023-12-17 DIAGNOSIS — Z7902 Long term (current) use of antithrombotics/antiplatelets: Secondary | ICD-10-CM | POA: Diagnosis not present

## 2023-12-17 DIAGNOSIS — E1165 Type 2 diabetes mellitus with hyperglycemia: Secondary | ICD-10-CM | POA: Diagnosis not present

## 2023-12-17 DIAGNOSIS — I2510 Atherosclerotic heart disease of native coronary artery without angina pectoris: Secondary | ICD-10-CM | POA: Diagnosis not present

## 2023-12-17 DIAGNOSIS — E785 Hyperlipidemia, unspecified: Secondary | ICD-10-CM | POA: Insufficient documentation

## 2023-12-17 DIAGNOSIS — R9431 Abnormal electrocardiogram [ECG] [EKG]: Secondary | ICD-10-CM | POA: Diagnosis not present

## 2023-12-17 DIAGNOSIS — Z794 Long term (current) use of insulin: Secondary | ICD-10-CM | POA: Diagnosis not present

## 2023-12-17 DIAGNOSIS — Z6841 Body Mass Index (BMI) 40.0 and over, adult: Secondary | ICD-10-CM | POA: Insufficient documentation

## 2023-12-17 DIAGNOSIS — I255 Ischemic cardiomyopathy: Secondary | ICD-10-CM | POA: Diagnosis not present

## 2023-12-17 DIAGNOSIS — I5022 Chronic systolic (congestive) heart failure: Secondary | ICD-10-CM | POA: Insufficient documentation
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
## 2023-12-17 NOTE — Patient Instructions (Signed)
 Thank you for your visit today.  You have been referred to the Lipid Clinic. They will call you to arrange your appointment.  Thank you for allowing us  to provider your heart failure care after your recent hospitalization. Please follow-up with your General Cardiologist as scheduled.

## 2023-12-17 NOTE — Addendum Note (Signed)
 Encounter addended by: Arleene Belt, PA-C on: 12/17/2023 3:57 PM  Actions taken: Clinical Note Signed

## 2023-12-18 ENCOUNTER — Other Ambulatory Visit (HOSPITAL_COMMUNITY): Payer: Self-pay

## 2023-12-18 ENCOUNTER — Other Ambulatory Visit: Payer: Self-pay | Admitting: Emergency Medicine

## 2023-12-26 ENCOUNTER — Other Ambulatory Visit: Payer: Self-pay | Admitting: Emergency Medicine

## 2023-12-26 ENCOUNTER — Other Ambulatory Visit: Payer: Self-pay

## 2024-01-02 ENCOUNTER — Ambulatory Visit: Admitting: Nurse Practitioner

## 2024-01-06 ENCOUNTER — Other Ambulatory Visit: Payer: Self-pay

## 2024-01-06 ENCOUNTER — Other Ambulatory Visit (HOSPITAL_COMMUNITY): Payer: Self-pay

## 2024-01-23 ENCOUNTER — Other Ambulatory Visit: Payer: Self-pay

## 2024-01-23 ENCOUNTER — Ambulatory Visit: Attending: Nurse Practitioner | Admitting: Nurse Practitioner

## 2024-01-23 ENCOUNTER — Other Ambulatory Visit (HOSPITAL_COMMUNITY): Payer: Self-pay

## 2024-01-23 ENCOUNTER — Encounter: Payer: Self-pay | Admitting: Nurse Practitioner

## 2024-01-23 VITALS — BP 117/67 | HR 83 | Ht 62.0 in | Wt 315.5 lb

## 2024-01-23 DIAGNOSIS — R42 Dizziness and giddiness: Secondary | ICD-10-CM | POA: Diagnosis not present

## 2024-01-23 DIAGNOSIS — I10 Essential (primary) hypertension: Secondary | ICD-10-CM | POA: Diagnosis not present

## 2024-01-23 DIAGNOSIS — E1165 Type 2 diabetes mellitus with hyperglycemia: Secondary | ICD-10-CM

## 2024-01-23 DIAGNOSIS — I25118 Atherosclerotic heart disease of native coronary artery with other forms of angina pectoris: Secondary | ICD-10-CM

## 2024-01-23 DIAGNOSIS — Z8673 Personal history of transient ischemic attack (TIA), and cerebral infarction without residual deficits: Secondary | ICD-10-CM

## 2024-01-23 DIAGNOSIS — E785 Hyperlipidemia, unspecified: Secondary | ICD-10-CM

## 2024-01-23 DIAGNOSIS — I5042 Chronic combined systolic (congestive) and diastolic (congestive) heart failure: Secondary | ICD-10-CM

## 2024-01-23 MED ORDER — ISOSORBIDE MONONITRATE ER 60 MG PO TB24
90.0000 mg | ORAL_TABLET | Freq: Every day | ORAL | 3 refills | Status: DC
  Filled 2024-01-23: qty 45, 30d supply, fill #0
  Filled 2024-02-21: qty 45, 30d supply, fill #1
  Filled 2024-03-19: qty 45, 30d supply, fill #2
  Filled 2024-05-14: qty 45, 30d supply, fill #3

## 2024-01-23 NOTE — Patient Instructions (Addendum)
 Medication Instructions:  Increase Imdur  90 mg daily  *If you need a refill on your cardiac medications before your next appointment, please call your pharmacy*  Lab Work: CBC, BMET today  Testing/Procedures:    Please report to Radiology at the [facility] Main Entrance 30 minutes early for your test.  [address] [place], [place] [address]  How to Prepare for Your Cardiac PET/CT Stress Test:  Nothing to eat or drink, except water , 3 hours prior to arrival time.  NO caffeine/decaffeinated products, or chocolate 12 hours prior to arrival. (Please note decaffeinated beverages (teas/coffees) still contain caffeine).  If you have caffeine within 12 hours prior, the test will need to be rescheduled.  Medication instructions: Do not take erectile dysfunction medications for 72 hours prior to test (sildenafil, tadalafil) Do not take nitrates (isosorbide  mononitrate, Ranexa) the day before or day of test Do not take tamsulosin the day before or morning of test Hold theophylline containing medications for 12 hours. Hold Dipyridamole 48 hours prior to the test.  Diabetic Preparation: If able to eat breakfast prior to 3 hour fasting, you may take all medications, including your insulin . Do not worry if you miss your breakfast dose of insulin  - start at your next meal. If you do not eat prior to 3 hour fast-Hold all diabetes (oral and insulin ) medications. Patients who wear a continuous glucose monitor MUST remove the device prior to scanning.  You may take your remaining medications with water .  NO perfume, cologne or lotion on chest or abdomen area. FEMALES - Please avoid wearing dresses to this appointment.  Total time is 1 to 2 hours; you may want to bring reading material for the waiting time.  IF YOU THINK YOU MAY BE PREGNANT, OR ARE NURSING PLEASE INFORM THE TECHNOLOGIST.  In preparation for your appointment, medication and supplies will be purchased.  Appointment  availability is limited, so if you need to cancel or reschedule, please call the Radiology Department Scheduler at [phone] 24 hours in advance to avoid a cancellation fee of $100.00  What to Expect When you Arrive:  Once you arrive and check in for your appointment, you will be taken to a preparation room within the Radiology Department.  A technologist or Nurse will obtain your medical history, verify that you are correctly prepped for the exam, and explain the procedure.  Afterwards, an IV will be started in your arm and electrodes will be placed on your skin for EKG monitoring during the stress portion of the exam. Then you will be escorted to the PET/CT scanner.  There, staff will get you positioned on the scanner and obtain a blood pressure and EKG.  During the exam, you will continue to be connected to the EKG and blood pressure machines.  A small, safe amount of a radioactive tracer will be injected in your IV to obtain a series of pictures of your heart along with an injection of a stress agent.    After your Exam:  It is recommended that you eat a meal and drink a caffeinated beverage to counter act any effects of the stress agent.  Drink plenty of fluids for the remainder of the day and urinate frequently for the first couple of hours after the exam.  Your doctor will inform you of your test results within 7-10 business days.  For more information and frequently asked questions, please visit our website: [email or web address]  For questions about your test or how to prepare for  your test, please call: Cardiac Imaging Nurse Navigators Office: [phone]   Follow-Up: At [facility], you and your health needs are our priority.  As part of our continuing mission to provide you with exceptional heart care, our providers are all part of one team.  This team includes your primary Cardiologist (physician) and Advanced Practice Providers or APPs (Physician Assistants  and Nurse Practitioners) who all work together to provide you with the care you need, when you need it.  Your next appointment:   6 week(s)  Provider:   Marlana Silvan NP or any APP    We recommend signing up for the patient portal called "MyChart".  Sign up information is provided on this After Visit Summary.  MyChart is used to connect with patients for Virtual Visits (Telemedicine).  Patients are able to view lab/test results, encounter notes, upcoming appointments, etc.  Non-urgent messages can be sent to your provider as well.   To learn more about what you can do with MyChart, go to [email or web address].

## 2024-01-23 NOTE — Progress Notes (Signed)
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  Respirations regular and unlabored, clear to auscultation bilaterally. GI: Soft, nontender, nondistended, BS + x 4. MS: no deformity or atrophy. Skin: warm and dry, no rash. Neuro:  Strength and sensation are intact. Psych: Normal affect.  Accessory Clinical Findings    ECG personally reviewed by me today -    - no EKG in office today.   Lab Results  Component Value Date   WBC 8.7 12/03/2023   HGB 11.3 (L) 12/03/2023   HCT 35.1 (L) 12/03/2023   MCV 90.2 12/03/2023   PLT 294 12/03/2023   Lab Results  Component Value Date   CREATININE 1.31 (H) 12/03/2023   BUN 23 (H) 12/03/2023   NA 138 12/03/2023   K 3.6 12/03/2023   CL 99 12/03/2023   CO2 27 12/03/2023   Lab Results  Component Value Date   ALT 12 11/08/2023   AST 13 (L) 11/08/2023   ALKPHOS 96 11/08/2023   BILITOT 0.2 11/08/2023   Lab Results  Component Value Date   CHOL 177 11/07/2023   HDL 43 11/07/2023   LDLCALC 109 (H) 11/07/2023   TRIG 125 11/07/2023   CHOLHDL 4.1 11/07/2023    Lab Results  Component Value Date   HGBA1C 9.9 (H)  11/06/2023    Assessment & Plan   1. CAD: CAD s/p NSTEMI, DESx 4 (p-mLAD, Ramus, pRCA) in 2019. Myoview  in January 2021 showed EF 36%, intermediate risk study with apical scar and moderate anterior apical ischemia with this scarring of anterior septum. Medical management was advised.  She was hospitalized in March 2025 in the setting of NSTEMI. Echo showed EF 45%, mildly decreased LV function, G1 DD, elevated left atrial pressure, normal RV systolic function, mild mitral valve regurgitation, no significant change from prior echo Ischemic evaluation was deferred.  She continues to note symptoms of intermittent chest discomfort both at rest and with activity.  She describes her symptoms as "indigestion,"similar to prior anginal equivalent.  She has not had relief with antacids, she did take nitroglycerin  with some relief of her symptoms.  Through shared decision making, will pursue cardiac PET stress test (not a treadmill candidate).  Will increase Imdur  to 90 mg daily.   Reviewed ED precautions.  Continue aspirin , Plavix , carvedilol , Entresto , spironolactone , Jardiance , Lasix , Imdur  as above, and Lipitor .  Informed Consent   Shared Decision Making/Informed Consent The risks [chest pain, shortness of breath, cardiac arrhythmias, dizziness, blood pressure fluctuations, myocardial infarction, stroke/transient ischemic attack, nausea, vomiting, allergic reaction, radiation exposure, metallic taste sensation and life-threatening complications (estimated to be 1 in 10,000)], benefits (risk stratification, diagnosing coronary artery disease, treatment guidance) and alternatives of a cardiac PET stress test were discussed in detail with Ms. Colomb and she agrees to proceed.     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 Most recent echo in 11/2023 showed EF 45%, mildly decreased LV function, G1 DD, elevated left atrial pressure, normal RV systolic  function, mild mitral valve regurgitation, no significant change from prior echo.  Euvolemic and well compensated on exam. Will update CBC, BMET today given recent medication changes. Will plan to schedule repeat echocardiogram at next follow-up visit pending cardiac PET stress test results.  Continue carvedilol , Entresto , spironolactone , Jardiance , and Lasix .   3. Lightheadedness/dizziness: Cardiac monitor was unremarkable. Most recent echo as above. CT of the neck in January 2023 showed less than 50% stenosis of R ICA, not considered hemodynamically significant.  Dizziness has been an ongoing issue, however, her symptoms have recently improved.  She denies any palpitations, presyncope or syncope.  Continue to monitor.    4. Hypertension: BP well controlled. Continue current antihypertensive regimen.   5. Hyperlipidemia: LDL was 109 in 11/2023, above goal.  She is no longer taking Zetia  (she reported swelling in her mouth and throat), follow-up with lipid clinic Pharm.D. as scheduled.  Continue Lipitor .  6. H/o CVA: Hospitalized 08/2021 in the setting of CVA. Bubble study was negative for PFO. TEE without cardiac source of embolism.  30-day event monitor showed normal sinus rhythm, no signs of atrial fibrillation or other arrhythmia. Following with neurology.  Continue Plavix , Lipitor .  7. Type 2 diabetes/obesity: A1c was 9.9 in 11/2023. Monitored and managed per PCP.    8. Disposition: Follow-up in 6 weeks.          Jude Norton, NP 01/23/2024, 9:08 AM

## 2024-01-24 ENCOUNTER — Telehealth: Payer: Self-pay | Admitting: Internal Medicine

## 2024-01-24 LAB — CBC
Hematocrit: 35.3 % (ref 34.0–46.6)
Hemoglobin: 11.4 g/dL (ref 11.1–15.9)
MCH: 29.6 pg (ref 26.6–33.0)
MCHC: 32.3 g/dL (ref 31.5–35.7)
MCV: 92 fL (ref 79–97)
Platelets: 324 10*3/uL (ref 150–450)
RBC: 3.85 x10E6/uL (ref 3.77–5.28)
RDW: 13.5 % (ref 11.7–15.4)
WBC: 11.2 10*3/uL — ABNORMAL HIGH (ref 3.4–10.8)

## 2024-01-24 LAB — BASIC METABOLIC PANEL WITH GFR
BUN/Creatinine Ratio: 18 (ref 9–23)
BUN: 27 mg/dL — ABNORMAL HIGH (ref 6–20)
CO2: 22 mmol/L (ref 20–29)
Calcium: 9.1 mg/dL (ref 8.7–10.2)
Chloride: 100 mmol/L (ref 96–106)
Creatinine, Ser: 1.47 mg/dL — ABNORMAL HIGH (ref 0.57–1.00)
Glucose: 197 mg/dL — ABNORMAL HIGH (ref 70–99)
Potassium: 4.3 mmol/L (ref 3.5–5.2)
Sodium: 140 mmol/L (ref 134–144)
eGFR: 47 mL/min/{1.73_m2} — ABNORMAL LOW (ref >59)

## 2024-01-24 NOTE — Telephone Encounter (Signed)
 Patient identification verified by 2 forms. Sims Duck, RN     Patient completed CBC and BMET yesterday 01/23/24. Results released to mychart for patient but final results from provider not available at this time.

## 2024-01-24 NOTE — Telephone Encounter (Signed)
 Patient would like to know her lab results. States that some of the levels look high and she is just concerned. Advised her that once results are read, someone will call her. FYI

## 2024-01-29 ENCOUNTER — Ambulatory Visit: Payer: Self-pay | Admitting: Nurse Practitioner

## 2024-02-05 ENCOUNTER — Telehealth: Payer: Self-pay

## 2024-02-05 ENCOUNTER — Other Ambulatory Visit: Payer: Self-pay

## 2024-02-05 ENCOUNTER — Other Ambulatory Visit (HOSPITAL_COMMUNITY): Payer: Self-pay

## 2024-02-05 ENCOUNTER — Inpatient Hospital Stay (HOSPITAL_BASED_OUTPATIENT_CLINIC_OR_DEPARTMENT_OTHER)
Admission: EM | Admit: 2024-02-05 | Discharge: 2024-02-09 | DRG: 287 | Disposition: A | Discharge status: Home / Self Care | Attending: Cardiovascular Disease | Admitting: Cardiovascular Disease

## 2024-02-05 ENCOUNTER — Emergency Department (HOSPITAL_BASED_OUTPATIENT_CLINIC_OR_DEPARTMENT_OTHER): Admitting: Radiology

## 2024-02-05 ENCOUNTER — Encounter (HOSPITAL_BASED_OUTPATIENT_CLINIC_OR_DEPARTMENT_OTHER): Payer: Self-pay | Admitting: Emergency Medicine

## 2024-02-05 DIAGNOSIS — Z7984 Long term (current) use of oral hypoglycemic drugs: Secondary | ICD-10-CM

## 2024-02-05 DIAGNOSIS — T82855A Stenosis of coronary artery stent, initial encounter: Secondary | ICD-10-CM | POA: Diagnosis present

## 2024-02-05 DIAGNOSIS — E785 Hyperlipidemia, unspecified: Secondary | ICD-10-CM | POA: Diagnosis present

## 2024-02-05 DIAGNOSIS — R079 Chest pain, unspecified: Principal | ICD-10-CM | POA: Diagnosis present

## 2024-02-05 DIAGNOSIS — E1169 Type 2 diabetes mellitus with other specified complication: Secondary | ICD-10-CM

## 2024-02-05 DIAGNOSIS — Z79899 Other long term (current) drug therapy: Secondary | ICD-10-CM

## 2024-02-05 DIAGNOSIS — I255 Ischemic cardiomyopathy: Secondary | ICD-10-CM | POA: Diagnosis present

## 2024-02-05 DIAGNOSIS — I5022 Chronic systolic (congestive) heart failure: Secondary | ICD-10-CM

## 2024-02-05 DIAGNOSIS — Z8632 Personal history of gestational diabetes: Secondary | ICD-10-CM

## 2024-02-05 DIAGNOSIS — Z6841 Body Mass Index (BMI) 40.0 and over, adult: Secondary | ICD-10-CM

## 2024-02-05 DIAGNOSIS — E1165 Type 2 diabetes mellitus with hyperglycemia: Secondary | ICD-10-CM | POA: Diagnosis present

## 2024-02-05 DIAGNOSIS — I200 Unstable angina: Secondary | ICD-10-CM | POA: Diagnosis present

## 2024-02-05 DIAGNOSIS — Z7982 Long term (current) use of aspirin: Secondary | ICD-10-CM

## 2024-02-05 DIAGNOSIS — I2582 Chronic total occlusion of coronary artery: Secondary | ICD-10-CM | POA: Diagnosis present

## 2024-02-05 DIAGNOSIS — R0789 Other chest pain: Secondary | ICD-10-CM | POA: Diagnosis not present

## 2024-02-05 DIAGNOSIS — I25110 Atherosclerotic heart disease of native coronary artery with unstable angina pectoris: Principal | ICD-10-CM | POA: Diagnosis present

## 2024-02-05 DIAGNOSIS — E66813 Obesity, class 3: Secondary | ICD-10-CM | POA: Diagnosis present

## 2024-02-05 DIAGNOSIS — Z8673 Personal history of transient ischemic attack (TIA), and cerebral infarction without residual deficits: Secondary | ICD-10-CM

## 2024-02-05 DIAGNOSIS — E039 Hypothyroidism, unspecified: Secondary | ICD-10-CM | POA: Diagnosis present

## 2024-02-05 DIAGNOSIS — I130 Hypertensive heart and chronic kidney disease with heart failure and stage 1 through stage 4 chronic kidney disease, or unspecified chronic kidney disease: Secondary | ICD-10-CM | POA: Diagnosis present

## 2024-02-05 DIAGNOSIS — I252 Old myocardial infarction: Secondary | ICD-10-CM

## 2024-02-05 DIAGNOSIS — Z7902 Long term (current) use of antithrombotics/antiplatelets: Secondary | ICD-10-CM

## 2024-02-05 DIAGNOSIS — E1122 Type 2 diabetes mellitus with diabetic chronic kidney disease: Secondary | ICD-10-CM | POA: Diagnosis present

## 2024-02-05 DIAGNOSIS — Y831 Surgical operation with implant of artificial internal device as the cause of abnormal reaction of the patient, or of later complication, without mention of misadventure at the time of the procedure: Secondary | ICD-10-CM | POA: Diagnosis present

## 2024-02-05 DIAGNOSIS — N1831 Chronic kidney disease, stage 3a: Secondary | ICD-10-CM | POA: Diagnosis present

## 2024-02-05 DIAGNOSIS — I5042 Chronic combined systolic (congestive) and diastolic (congestive) heart failure: Secondary | ICD-10-CM | POA: Diagnosis present

## 2024-02-05 LAB — BASIC METABOLIC PANEL WITH GFR
Anion gap: 14 (ref 5–15)
BUN: 22 mg/dL — ABNORMAL HIGH (ref 6–20)
CO2: 22 mmol/L (ref 22–32)
Calcium: 9.3 mg/dL (ref 8.9–10.3)
Chloride: 101 mmol/L (ref 98–111)
Creatinine, Ser: 1.32 mg/dL — ABNORMAL HIGH (ref 0.44–1.00)
GFR, Estimated: 53 mL/min — ABNORMAL LOW (ref >60)
Glucose, Bld: 232 mg/dL — ABNORMAL HIGH (ref 70–99)
Potassium: 3.9 mmol/L (ref 3.5–5.1)
Sodium: 138 mmol/L (ref 135–145)

## 2024-02-05 LAB — CBC
HCT: 35.9 % — ABNORMAL LOW (ref 36.0–46.0)
HCT: 37.3 % (ref 36.0–46.0)
Hemoglobin: 11.7 g/dL — ABNORMAL LOW (ref 12.0–15.0)
Hemoglobin: 12 g/dL (ref 12.0–15.0)
MCH: 29.9 pg (ref 26.0–34.0)
MCH: 29.4 pg (ref 26.0–34.0)
MCHC: 32.6 g/dL (ref 30.0–36.0)
MCHC: 32.2 g/dL (ref 30.0–36.0)
MCV: 91.8 fL (ref 80.0–100.0)
MCV: 91.4 fL (ref 80.0–100.0)
Platelets: 310 10*3/uL (ref 150–400)
Platelets: 361 10*3/uL (ref 150–400)
RBC: 3.91 MIL/uL (ref 3.87–5.11)
RBC: 4.08 MIL/uL (ref 3.87–5.11)
RDW: 13.5 % (ref 11.5–15.5)
RDW: 13.4 % (ref 11.5–15.5)
WBC: 8.4 10*3/uL (ref 4.0–10.5)
WBC: 9.8 10*3/uL (ref 4.0–10.5)
nRBC: 0 % (ref 0.0–0.2)
nRBC: 0 % (ref 0.0–0.2)

## 2024-02-05 LAB — HEPATIC FUNCTION PANEL
ALT: 10 U/L (ref 0–44)
AST: 17 U/L (ref 15–41)
Albumin: 3.7 g/dL (ref 3.5–5.0)
Alkaline Phosphatase: 119 U/L (ref 38–126)
Bilirubin, Direct: <0.1 mg/dL (ref 0.0–0.2)
Total Bilirubin: 0.2 mg/dL (ref 0.0–1.2)
Total Protein: 7.4 g/dL (ref 6.5–8.1)

## 2024-02-05 LAB — PREGNANCY, URINE: Preg Test, Ur: NEGATIVE

## 2024-02-05 LAB — PRO BRAIN NATRIURETIC PEPTIDE: Pro Brain Natriuretic Peptide: 797 pg/mL — ABNORMAL HIGH (ref <300.0)

## 2024-02-05 LAB — GLUCOSE, CAPILLARY: Glucose-Capillary: 267 mg/dL — ABNORMAL HIGH (ref 70–99)

## 2024-02-05 LAB — LIPASE, BLOOD: Lipase: 50 U/L (ref 11–51)

## 2024-02-05 LAB — TROPONIN T, HIGH SENSITIVITY
Troponin T High Sensitivity: <15 ng/L (ref <19)
Troponin T High Sensitivity: <15 ng/L (ref <19)

## 2024-02-05 LAB — CREATININE, SERUM
Creatinine, Ser: 1.34 mg/dL — ABNORMAL HIGH (ref 0.44–1.00)
GFR, Estimated: 53 mL/min — ABNORMAL LOW (ref >60)

## 2024-02-05 LAB — HEPARIN LEVEL (UNFRACTIONATED): Heparin Unfractionated: 0.27 [IU]/mL — ABNORMAL LOW (ref 0.30–0.70)

## 2024-02-05 MED ORDER — SACUBITRIL-VALSARTAN 97-103 MG PO TABS
1.0000 | ORAL_TABLET | Freq: Two times a day (BID) | ORAL | Status: DC
  Administered 2024-02-05 – 2024-02-09 (×8): 1 via ORAL
  Filled 2024-02-05 (×8): qty 1

## 2024-02-05 MED ORDER — INSULIN ISOPHANE & REGULAR (HUMAN 70-30)100 UNIT/ML KWIKPEN: 15.0000 [IU] | PEN_INJECTOR | Freq: Two times a day (BID) | SUBCUTANEOUS | Status: DC

## 2024-02-05 MED ORDER — ACETAMINOPHEN 325 MG PO TABS: 650.0000 mg | ORAL_TABLET | ORAL | Status: DC | PRN

## 2024-02-05 MED ORDER — NITROGLYCERIN 0.4 MG SL SUBL
0.4000 mg | SUBLINGUAL_TABLET | SUBLINGUAL | Status: AC | PRN
  Administered 2024-02-05 (×3): 0.4 mg via SUBLINGUAL
  Filled 2024-02-05: qty 1

## 2024-02-05 MED ORDER — FUROSEMIDE 40 MG PO TABS: 40.0000 mg | ORAL_TABLET | Freq: Two times a day (BID) | ORAL | Status: DC

## 2024-02-05 MED ORDER — NITROGLYCERIN 0.4 MG SL SUBL: 0.4000 mg | SUBLINGUAL_TABLET | SUBLINGUAL | Status: DC | PRN

## 2024-02-05 MED ORDER — SPIRONOLACTONE 25 MG PO TABS
25.0000 mg | ORAL_TABLET | Freq: Every day | ORAL | Status: DC
  Administered 2024-02-06 – 2024-02-09 (×4): 25 mg via ORAL
  Filled 2024-02-05 (×4): qty 1

## 2024-02-05 MED ORDER — HEPARIN BOLUS VIA INFUSION
4000.0000 [IU] | Freq: Once | INTRAVENOUS | Status: AC
  Administered 2024-02-05: 4000 [IU] via INTRAVENOUS

## 2024-02-05 MED ORDER — ASPIRIN 81 MG PO CHEW
324.0000 mg | CHEWABLE_TABLET | Freq: Once | ORAL | Status: AC
  Administered 2024-02-05: 324 mg via ORAL
  Filled 2024-02-05: qty 4

## 2024-02-05 MED ORDER — ATORVASTATIN CALCIUM 80 MG PO TABS
80.0000 mg | ORAL_TABLET | Freq: Every day | ORAL | Status: DC
  Administered 2024-02-05 – 2024-02-09 (×5): 80 mg via ORAL
  Filled 2024-02-05 (×5): qty 1

## 2024-02-05 MED ORDER — ISOSORBIDE MONONITRATE ER 60 MG PO TB24
90.0000 mg | ORAL_TABLET | Freq: Every day | ORAL | Status: DC
  Administered 2024-02-05 – 2024-02-09 (×5): 90 mg via ORAL
  Filled 2024-02-05 (×5): qty 1

## 2024-02-05 MED ORDER — ASPIRIN 81 MG PO TBEC
81.0000 mg | DELAYED_RELEASE_TABLET | Freq: Every day | ORAL | Status: DC
  Administered 2024-02-07 – 2024-02-09 (×3): 81 mg via ORAL
  Filled 2024-02-05 (×3): qty 1

## 2024-02-05 MED ORDER — ONDANSETRON HCL 4 MG/2ML IJ SOLN
4.0000 mg | Freq: Four times a day (QID) | INTRAMUSCULAR | Status: DC | PRN
  Administered 2024-02-08: 4 mg via INTRAVENOUS
  Filled 2024-02-05: qty 2

## 2024-02-05 MED ORDER — CLOPIDOGREL BISULFATE 75 MG PO TABS
75.0000 mg | ORAL_TABLET | Freq: Every day | ORAL | Status: DC
  Administered 2024-02-05 – 2024-02-06 (×2): 75 mg via ORAL
  Filled 2024-02-05 (×2): qty 1

## 2024-02-05 MED ORDER — HEPARIN (PORCINE) 25000 UT/250ML-% IV SOLN
1600.0000 [IU]/h | INTRAVENOUS | Status: DC
  Administered 2024-02-05: 1450 [IU]/h via INTRAVENOUS
  Administered 2024-02-06: 1600 [IU]/h via INTRAVENOUS
  Filled 2024-02-05 (×2): qty 250

## 2024-02-05 MED ORDER — EMPAGLIFLOZIN 10 MG PO TABS
10.0000 mg | ORAL_TABLET | Freq: Every day | ORAL | Status: DC
  Administered 2024-02-07 – 2024-02-09 (×3): 10 mg via ORAL
  Filled 2024-02-05 (×3): qty 1

## 2024-02-05 MED ORDER — INSULIN ASPART PROT & ASPART (70-30 MIX) 100 UNIT/ML ~~LOC~~ SUSP
15.0000 [IU] | Freq: Two times a day (BID) | SUBCUTANEOUS | Status: DC
  Administered 2024-02-06: 15 [IU] via SUBCUTANEOUS
  Filled 2024-02-05: qty 10

## 2024-02-05 MED ORDER — CARVEDILOL 25 MG PO TABS
37.5000 mg | ORAL_TABLET | Freq: Two times a day (BID) | ORAL | Status: DC
  Administered 2024-02-06 – 2024-02-09 (×7): 37.5 mg via ORAL
  Filled 2024-02-05 (×7): qty 1

## 2024-02-05 NOTE — ED Notes (Signed)
 States she is chest pain free.

## 2024-02-05 NOTE — ED Notes (Signed)
 [facility] at bedside
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
## 2024-02-05 NOTE — ED Notes (Signed)
 Patient transported to X-ray
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
## 2024-02-05 NOTE — Telephone Encounter (Signed)
 Pharmacy Patient Advocate Encounter  Received notification from [employer] that Prior Authorization for Ozempic  (0.25 or 0.5 MG/DOSE) 2MG /3ML pen-injectors  has been APPROVED  Ran test claim, Copay is $4.00 This test claim was processed through [facility]- copay amounts may vary at other pharmacies due to pharmacy/plan contracts, or as the patient moves through the different stages of their insurance plan.

## 2024-02-05 NOTE — ED Provider Notes (Signed)
 [place] [facility] AT [facility] Provider Note   CSN: [id number] Arrival date & time: 02/05/24  0825     History  Chief Complaint  Patient presents with   Chest Pain    Lauren Delgado is a 38 y.o. female.  The history is provided by the patient and medical records. No language interpreter was used.  Chest Pain Pain location:  Substernal area Pain quality: dull, pressure and radiating   Pain radiates to:  R shoulder and R arm Pain severity:  Severe Onset quality:  Gradual Duration:  3 days Timing:  Intermittent Progression:  Waxing and waning Chronicity:  New Relieved by:  Nothing Worsened by:  Nothing Ineffective treatments:  None tried Associated symptoms: diaphoresis, fatigue, nausea and shortness of breath   Associated symptoms: no abdominal pain, no altered mental status, no back pain, no cough, no fever, no headache, no lower extremity edema, no near-syncope, no numbness, no palpitations, no syncope and no vomiting   Risk factors: coronary artery disease        Home Medications Prior to Admission medications   Medication Sig Start Date End Date Taking? Authorizing Provider  aspirin  EC 81 MG tablet Take 81 mg by mouth daily. Swallow whole.    [provider]  atorvastatin  (LIPITOR ) 80 MG tablet Take 1 tablet (80 mg total) by mouth daily. 11/25/23   Elmarie Hacking, FNP  carvedilol  (COREG ) 12.5 MG tablet Take 3 tablets (37.5 mg total) by mouth 2 (two) times daily with a meal. 11/25/23   [name], Arlice Bene, FNP  clopidogrel  (PLAVIX ) 75 MG tablet Take 1 tablet (75 mg total) by mouth daily. 11/25/23   Elmarie Hacking, FNP  empagliflozin  (JARDIANCE ) 10 MG TABS tablet Take 1 tablet (10 mg total) by mouth daily before breakfast. 11/25/23   [name], Arlice Bene, FNP  furosemide  (LASIX ) 40 MG tablet Take 1 tablet (40 mg total) by mouth 2 (two) times daily. 11/25/23   Elmarie Hacking, FNP  glipiZIDE  (GLUCOTROL  XL) 5 MG 24 hr tablet Take 1  tablet (5 mg total) by mouth daily with breakfast. 12/02/23   Elvira Hammersmith, MD  insulin  isophane & regular human KwikPen (HUMULIN  70/30 KWIKPEN) (70-30) 100 UNIT/ML KwikPen Inject 15 Units into the skin 2 (two) times daily. 12/02/23   Sagardia, Miguel Jose, MD  Insulin  Pen Needle (BD PEN NEEDLE NANO 2ND GEN) 32G X 4 MM MISC USE TO INJECT INSULIN  UP TO 4 TIMES DAILY AS NEEDED 11/22/23   Elvira Hammersmith, MD  isosorbide  mononitrate (IMDUR ) 60 MG 24 hr tablet Take 1.5 tablets (90 mg total) by mouth daily. 01/23/24   Jude Norton, NP  nitroGLYCERIN  (NITROSTAT ) 0.4 MG SL tablet Place 1 tablet (0.4 mg total) under the tongue every 5 (five) minutes as needed for chest pain. 11/20/23 02/18/24  Hazle Lites, MD  sacubitril -valsartan  (ENTRESTO ) 97-103 MG Take 1 tablet by mouth 2 (two) times daily. 11/25/23   [name], Arlice Bene, FNP  spironolactone  (ALDACTONE ) 25 MG tablet Take 1 tablet (25 mg total) by mouth daily. 11/25/23   [name], Arlice Bene, FNP  valACYclovir  (VALTREX ) 500 MG tablet Take 1 tablet (500 mg total) by mouth 2 (two) times daily. Patient taking differently: Take 500 mg by mouth as needed. 12/02/23   Sagardia, Miguel Jose, MD      Allergies    Metformin  and related and Ozempic  (0.25 or 0.5 mg-dose) [semaglutide (0.25 or 0.5mg -dos)]    Review of Systems   Review of Systems  Constitutional:  Positive for diaphoresis and fatigue. Negative for chills and fever.  HENT:  Negative for congestion.   Respiratory:  Positive for chest tightness and shortness of breath. Negative for cough.   Cardiovascular:  Positive for chest pain. Negative for palpitations, syncope and near-syncope.  Gastrointestinal:  Positive for nausea. Negative for abdominal pain, constipation, diarrhea and vomiting.  Genitourinary:  Negative for dysuria and flank pain.  Musculoskeletal:  Negative for back pain, neck pain and neck stiffness.  Skin:  Negative for rash and wound.  Neurological:  Negative for  light-headedness, numbness and headaches.  Psychiatric/Behavioral:  Negative for agitation and confusion.   All other systems reviewed and are negative.   Physical Exam Updated Vital Signs BP (!) 172/99 (BP Location: Right Wrist)   Pulse 70   Temp 98 F (36.7 C) (Oral)   Resp 18   LMP 10/15/2023 (Approximate)   SpO2 100%  Physical Exam Vitals and nursing note reviewed.  Constitutional:      General: She is not in acute distress.    Appearance: She is well-developed. She is not ill-appearing, toxic-appearing or diaphoretic.  HENT:     Head: Normocephalic and atraumatic.  Eyes:     Conjunctiva/sclera: Conjunctivae normal.  Cardiovascular:     Rate and Rhythm: Normal rate and regular rhythm.     Heart sounds: Normal heart sounds. No murmur heard. Pulmonary:     Effort: Pulmonary effort is normal. No respiratory distress.     Breath sounds: Normal breath sounds. No wheezing, rhonchi or rales.  Chest:     Chest wall: No tenderness.  Abdominal:     Palpations: Abdomen is soft.     Tenderness: There is no abdominal tenderness.  Musculoskeletal:        General: No swelling.     Cervical back: Neck supple.     Right lower leg: Edema (chronic per pt) present.     Left lower leg: Edema (chronic per pt) present.  Skin:    General: Skin is warm and dry.     Capillary Refill: Capillary refill takes less than 2 seconds.     Findings: No erythema.  Neurological:     General: No focal deficit present.     Mental Status: She is alert.  Psychiatric:        Mood and Affect: Mood normal.     ED Results / Procedures / Treatments   Labs (all labs ordered are listed, but only abnormal results are displayed) Labs Reviewed  BASIC METABOLIC PANEL WITH GFR - Abnormal; Notable for the following components:      Result Value   Glucose, Bld 232 (*)    BUN 22 (*)    Creatinine, Ser 1.32 (*)    GFR, Estimated 53 (*)    All other components within normal limits  CBC - Abnormal; Notable  for the following components:   Hemoglobin 11.7 (*)    HCT 35.9 (*)    All other components within normal limits  PRO BRAIN NATRIURETIC PEPTIDE - Abnormal; Notable for the following components:   Pro Brain Natriuretic Peptide 797.0 (*)    All other components within normal limits  PREGNANCY, URINE  LIPASE, BLOOD  HEPATIC FUNCTION PANEL  HEPARIN  LEVEL (UNFRACTIONATED)  TROPONIN T, HIGH SENSITIVITY  TROPONIN T, HIGH SENSITIVITY    EKG EKG Interpretation Date/Time:  Wednesday February 05 2024 08:33:29 EDT Ventricular Rate:  77 PR Interval:  154 QRS Duration:  102 QT Interval:  418 QTC Calculation: 473  R Axis:   32  Text Interpretation: Normal sinus rhythm Normal ECG When compared with ECG of 17-Dec-2023 13:57, ST no longer depressed in Inferior leads T wave inversion no longer evident in Inferior leads T wave inversion no longer evident in Lateral leads when compared to prior, previous t wave inversion in lead 3 now resolved. No STEMI Confirmed by Wynell Heath ([id number]) on 02/05/2024 8:41:26 AM  Radiology DG Chest 2 View Result Date: 02/05/2024 CLINICAL DATA:  Chest pain EXAM: CHEST - 2 VIEW COMPARISON:  December 03, 2023 FINDINGS: Persistent bilateral reticular interstitial infiltrates which correlates with the CT of the chest October 10/23/2021 and correlates with reticulonodular chronic pulmonary infiltrates that can be seen in patients with a healed varicella pneumonia. No other infiltrates or consolidations. Heart and mediastinum normal IMPRESSION: Chronic interstitial lung reticulonodular pattern as described may correlate with healed varicella pneumonia. No acute cardiopulmonary infiltrates Electronically Signed   By: Fredrich Jefferson M.D.   On: 02/05/2024 10:07    Procedures Procedures    Medications Ordered in ED Medications  heparin  ADULT infusion 100 units/mL (25000 units/250mL) (1,450 Units/hr Intravenous New Bag/Given 02/05/24 1221)  nitroGLYCERIN  (NITROSTAT ) SL tablet 0.4 mg (0.4  mg Sublingual Given 02/05/24 6045)  aspirin  chewable tablet 324 mg (324 mg Oral Given 02/05/24 0921)  heparin  bolus via infusion 4,000 Units (4,000 Units Intravenous Bolus from Bag 02/05/24 1221)    ED Course/ Medical Decision Making/ A&P                                 Medical Decision Making Amount and/or Complexity of Data Reviewed Labs: ordered. Radiology: ordered.  Risk OTC drugs. Prescription drug management. Decision regarding hospitalization.    LARUE DRAWDY is a 38 y.o. female with a complex past medical history including CAD with previous MI and cardiac stenting, CHF, obesity, hyperlipidemia, diabetes, and hypothyroidism who presents with chest pain.  According to patient, for the last few days she has been having pain in her central chest going down her right arm with shortness of breath.  She reports this is how she felt when she had to have a previous stent.  She reports she took nitro this morning that seem to help a little bit.  She reports the pain is up to 10 out of 10 in severity.  She denies any recent fevers or chills but has had shortness of breath with the pain.  She reports she woke up sweaty and having nausea.  Patient says that she has not had any fevers or chills and has not had any sick exposures.  No cough or congestion.  No constipation, diarrhea, or urinary changes.  Denies new swelling or pain in her extremities.  Denies any abdominal symptoms or lower back pain.  EKG on arrival does not show STEMI.  Patient says that her cardiologist is scheduled her for a stress test as an outpatient for August but she does not talk to her cardiology team since the pain began over the last few days.  On my exam, lungs clear.  Chest nontender.  I could not reproduce her discomfort.  Abdomen nontender.  Good pulses in extremities.  Legs have some edema but she reports it is unchanged from prior.  Patient otherwise resting.  Clinically I am concerned about ruling out a  cardiac etiology of symptoms.  With her history and saying this feels similar to when she had have a stent,  we will get cardiac workup and we will give her aspirin .  She has not taken aspirin  yet today.  Will also give her another nitro to see if this helps.  Anticipate discussion with cardiology after her workup to determine disposition.  11:41 AM On reassessment patient is now chest pain-free after nitro and aspirin .  Initial troponin is undetectable, will trend.  Lipase not elevated.  CBC and hepatic function normal.  BMP showed creatinine of 1.32 but otherwise no significant electrolyte disturbance.  Her proBNP was elevated at 797 which is elevated for her age.  Her chest x-ray showed no acute changes compared to prior with healed pneumonia seen by before.  Given her improvement with the nitro and her description of symptoms, will call cardiology and discuss a plan.  Anticipate possible admission for further management.  Cardiology would like to admit and start on heparin  for suspected unstable angina.  Will admit for further management to cardiology service at [facility].         Final Clinical Impression(s) / ED Diagnoses Final diagnoses:  Chest pain, unspecified type     Clinical Impression: 1. Chest pain, unspecified type     Disposition: Admit  This note was prepared with assistance of Dragon voice recognition software. Occasional wrong-word or sound-a-like substitutions may have occurred due to the inherent limitations of voice recognition software.      Stephen Turnbaugh, Marine Sia, MD 02/05/24 [phone]

## 2024-02-05 NOTE — ED Notes (Signed)
 States chest pain decreased from a 8 to a 5 with two nitroglycerin  SL.  Third nitroglycerin  SL given

## 2024-02-05 NOTE — ED Triage Notes (Signed)
 Pt arrived POV c/o CP radiating down R arm with SOB for a few days, worsened last night. Pt took prescribed nitroglycerin  this morning with minimal relief. Extensive cardiac hx - MI, CHF, CAD, cardiac stents.

## 2024-02-06 ENCOUNTER — Encounter (HOSPITAL_COMMUNITY)
Admission: EM | Disposition: A | Discharge status: Home / Self Care | Payer: Self-pay | Source: Home / Self Care | Attending: Cardiology

## 2024-02-06 ENCOUNTER — Encounter (HOSPITAL_COMMUNITY): Payer: Self-pay | Admitting: Internal Medicine

## 2024-02-06 DIAGNOSIS — I130 Hypertensive heart and chronic kidney disease with heart failure and stage 1 through stage 4 chronic kidney disease, or unspecified chronic kidney disease: Secondary | ICD-10-CM | POA: Diagnosis not present

## 2024-02-06 DIAGNOSIS — I255 Ischemic cardiomyopathy: Secondary | ICD-10-CM | POA: Diagnosis not present

## 2024-02-06 DIAGNOSIS — I2510 Atherosclerotic heart disease of native coronary artery without angina pectoris: Secondary | ICD-10-CM

## 2024-02-06 DIAGNOSIS — Z79899 Other long term (current) drug therapy: Secondary | ICD-10-CM | POA: Diagnosis not present

## 2024-02-06 DIAGNOSIS — I5022 Chronic systolic (congestive) heart failure: Secondary | ICD-10-CM

## 2024-02-06 DIAGNOSIS — E1122 Type 2 diabetes mellitus with diabetic chronic kidney disease: Secondary | ICD-10-CM | POA: Diagnosis not present

## 2024-02-06 DIAGNOSIS — Z7982 Long term (current) use of aspirin: Secondary | ICD-10-CM | POA: Diagnosis not present

## 2024-02-06 DIAGNOSIS — Z6841 Body Mass Index (BMI) 40.0 and over, adult: Secondary | ICD-10-CM | POA: Diagnosis not present

## 2024-02-06 DIAGNOSIS — N1831 Chronic kidney disease, stage 3a: Secondary | ICD-10-CM | POA: Diagnosis not present

## 2024-02-06 DIAGNOSIS — I5021 Acute systolic (congestive) heart failure: Secondary | ICD-10-CM | POA: Diagnosis not present

## 2024-02-06 DIAGNOSIS — T82855A Stenosis of coronary artery stent, initial encounter: Secondary | ICD-10-CM | POA: Diagnosis not present

## 2024-02-06 DIAGNOSIS — I200 Unstable angina: Secondary | ICD-10-CM | POA: Diagnosis not present

## 2024-02-06 DIAGNOSIS — I214 Non-ST elevation (NSTEMI) myocardial infarction: Secondary | ICD-10-CM | POA: Diagnosis not present

## 2024-02-06 DIAGNOSIS — I25110 Atherosclerotic heart disease of native coronary artery with unstable angina pectoris: Secondary | ICD-10-CM | POA: Diagnosis not present

## 2024-02-06 DIAGNOSIS — Z8673 Personal history of transient ischemic attack (TIA), and cerebral infarction without residual deficits: Secondary | ICD-10-CM | POA: Diagnosis not present

## 2024-02-06 DIAGNOSIS — E11649 Type 2 diabetes mellitus with hypoglycemia without coma: Secondary | ICD-10-CM | POA: Diagnosis not present

## 2024-02-06 DIAGNOSIS — E1165 Type 2 diabetes mellitus with hyperglycemia: Secondary | ICD-10-CM | POA: Diagnosis not present

## 2024-02-06 DIAGNOSIS — Y831 Surgical operation with implant of artificial internal device as the cause of abnormal reaction of the patient, or of later complication, without mention of misadventure at the time of the procedure: Secondary | ICD-10-CM | POA: Diagnosis present

## 2024-02-06 DIAGNOSIS — E039 Hypothyroidism, unspecified: Secondary | ICD-10-CM | POA: Diagnosis not present

## 2024-02-06 DIAGNOSIS — E66813 Obesity, class 3: Secondary | ICD-10-CM | POA: Diagnosis not present

## 2024-02-06 DIAGNOSIS — I5042 Chronic combined systolic (congestive) and diastolic (congestive) heart failure: Secondary | ICD-10-CM | POA: Diagnosis not present

## 2024-02-06 DIAGNOSIS — R0789 Other chest pain: Secondary | ICD-10-CM | POA: Diagnosis not present

## 2024-02-06 DIAGNOSIS — Z8632 Personal history of gestational diabetes: Secondary | ICD-10-CM | POA: Diagnosis not present

## 2024-02-06 DIAGNOSIS — I509 Heart failure, unspecified: Secondary | ICD-10-CM | POA: Diagnosis not present

## 2024-02-06 DIAGNOSIS — Z7984 Long term (current) use of oral hypoglycemic drugs: Secondary | ICD-10-CM | POA: Diagnosis not present

## 2024-02-06 DIAGNOSIS — E785 Hyperlipidemia, unspecified: Secondary | ICD-10-CM | POA: Diagnosis not present

## 2024-02-06 DIAGNOSIS — Z7902 Long term (current) use of antithrombotics/antiplatelets: Secondary | ICD-10-CM | POA: Diagnosis not present

## 2024-02-06 DIAGNOSIS — I252 Old myocardial infarction: Secondary | ICD-10-CM | POA: Diagnosis not present

## 2024-02-06 DIAGNOSIS — I2582 Chronic total occlusion of coronary artery: Secondary | ICD-10-CM | POA: Diagnosis not present

## 2024-02-06 HISTORY — PX: CORONARY PRESSURE/FFR STUDY: CATH118243

## 2024-02-06 HISTORY — PX: LEFT HEART CATH AND CORONARY ANGIOGRAPHY: CATH118249

## 2024-02-06 LAB — GLUCOSE, CAPILLARY
Glucose-Capillary: 301 mg/dL — ABNORMAL HIGH (ref 70–99)
Glucose-Capillary: 357 mg/dL — ABNORMAL HIGH (ref 70–99)
Glucose-Capillary: 302 mg/dL — ABNORMAL HIGH (ref 70–99)

## 2024-02-06 LAB — CBC
HCT: 32.7 % — ABNORMAL LOW (ref 36.0–46.0)
Hemoglobin: 10.9 g/dL — ABNORMAL LOW (ref 12.0–15.0)
MCH: 30.5 pg (ref 26.0–34.0)
MCHC: 33.3 g/dL (ref 30.0–36.0)
MCV: 91.6 fL (ref 80.0–100.0)
Platelets: 282 10*3/uL (ref 150–400)
RBC: 3.57 MIL/uL — ABNORMAL LOW (ref 3.87–5.11)
RDW: 13.2 % (ref 11.5–15.5)
WBC: 8.6 10*3/uL (ref 4.0–10.5)
nRBC: 0 % (ref 0.0–0.2)

## 2024-02-06 LAB — BASIC METABOLIC PANEL WITH GFR
Anion gap: 8 (ref 5–15)
BUN: 22 mg/dL — ABNORMAL HIGH (ref 6–20)
CO2: 26 mmol/L (ref 22–32)
Calcium: 8.6 mg/dL — ABNORMAL LOW (ref 8.9–10.3)
Chloride: 101 mmol/L (ref 98–111)
Creatinine, Ser: 1.45 mg/dL — ABNORMAL HIGH (ref 0.44–1.00)
GFR, Estimated: 48 mL/min — ABNORMAL LOW (ref >60)
Glucose, Bld: 337 mg/dL — ABNORMAL HIGH (ref 70–99)
Potassium: 3.4 mmol/L — ABNORMAL LOW (ref 3.5–5.1)
Sodium: 135 mmol/L (ref 135–145)

## 2024-02-06 LAB — HEPARIN LEVEL (UNFRACTIONATED)
Heparin Unfractionated: 0.37 [IU]/mL (ref 0.30–0.70)
Heparin Unfractionated: 0.33 [IU]/mL (ref 0.30–0.70)

## 2024-02-06 LAB — HEMOGLOBIN A1C
Hgb A1c MFr Bld: 9.7 % — ABNORMAL HIGH (ref 4.8–5.6)
Mean Plasma Glucose: 231.69 mg/dL

## 2024-02-06 LAB — POCT ACTIVATED CLOTTING TIME: Activated Clotting Time: 279 s

## 2024-02-06 SURGERY — LEFT HEART CATH AND CORONARY ANGIOGRAPHY
Anesthesia: LOCAL

## 2024-02-06 MED ORDER — FENTANYL CITRATE (PF) 100 MCG/2ML IJ SOLN
INTRAMUSCULAR | Status: DC | PRN
  Administered 2024-02-06: 25 ug via INTRAVENOUS

## 2024-02-06 MED ORDER — HEPARIN SODIUM (PORCINE) 1000 UNIT/ML IJ SOLN
INTRAMUSCULAR | Status: AC
Start: 2024-02-06 — End: ?
  Filled 2024-02-06: qty 10

## 2024-02-06 MED ORDER — NITROGLYCERIN 1 MG/10 ML FOR IR/CATH LAB
INTRA_ARTERIAL | Status: AC
  Filled 2024-02-06: qty 10

## 2024-02-06 MED ORDER — MIDAZOLAM HCL 2 MG/2ML IJ SOLN
INTRAMUSCULAR | Status: DC | PRN
  Administered 2024-02-06: 1 mg via INTRAVENOUS

## 2024-02-06 MED ORDER — HEPARIN SODIUM (PORCINE) 1000 UNIT/ML IJ SOLN
INTRAMUSCULAR | Status: AC
  Filled 2024-02-06: qty 10

## 2024-02-06 MED ORDER — SODIUM CHLORIDE 0.9 % WEIGHT BASED INFUSION: 3.0000 mL/kg/h | INTRAVENOUS | Status: DC

## 2024-02-06 MED ORDER — HYDRALAZINE HCL 20 MG/ML IJ SOLN: 10.0000 mg | INTRAMUSCULAR | Status: AC | PRN

## 2024-02-06 MED ORDER — LIDOCAINE HCL (PF) 1 % IJ SOLN
INTRAMUSCULAR | Status: AC
  Filled 2024-02-06: qty 30

## 2024-02-06 MED ORDER — LABETALOL HCL 5 MG/ML IV SOLN: 10.0000 mg | INTRAVENOUS | Status: AC | PRN

## 2024-02-06 MED ORDER — SODIUM CHLORIDE 0.9% FLUSH
3.0000 mL | Freq: Two times a day (BID) | INTRAVENOUS | Status: DC
  Administered 2024-02-06 – 2024-02-08 (×6): 3 mL via INTRAVENOUS

## 2024-02-06 MED ORDER — INSULIN ASPART 100 UNIT/ML IJ SOLN
10.0000 [IU] | Freq: Once | INTRAMUSCULAR | Status: AC
  Administered 2024-02-06: 10 [IU] via SUBCUTANEOUS

## 2024-02-06 MED ORDER — SODIUM CHLORIDE 0.9 % IV SOLN: INTRAVENOUS | Status: AC

## 2024-02-06 MED ORDER — VERAPAMIL HCL 2.5 MG/ML IV SOLN
INTRAVENOUS | Status: AC
Start: 2024-02-06 — End: ?
  Filled 2024-02-06: qty 2

## 2024-02-06 MED ORDER — HEPARIN (PORCINE) 25000 UT/250ML-% IV SOLN
1700.0000 [IU]/h | INTRAVENOUS | Status: DC
  Administered 2024-02-06 – 2024-02-08 (×3): 1600 [IU]/h via INTRAVENOUS
  Filled 2024-02-06 (×3): qty 250

## 2024-02-06 MED ORDER — HEPARIN (PORCINE) IN NACL 1000-0.9 UT/500ML-% IV SOLN
INTRAVENOUS | Status: DC | PRN
  Administered 2024-02-06 (×2): 500 mL

## 2024-02-06 MED ORDER — HEPARIN SODIUM (PORCINE) 1000 UNIT/ML IJ SOLN
INTRAMUSCULAR | Status: DC | PRN
  Administered 2024-02-06: 5000 [IU] via INTRAVENOUS
  Administered 2024-02-06: 8000 [IU] via INTRAVENOUS

## 2024-02-06 MED ORDER — NITROGLYCERIN 1 MG/10 ML FOR IR/CATH LAB
INTRA_ARTERIAL | Status: DC | PRN
  Administered 2024-02-06: 100 ug via INTRACORONARY

## 2024-02-06 MED ORDER — MIDAZOLAM HCL 2 MG/2ML IJ SOLN
INTRAMUSCULAR | Status: AC
Start: 2024-02-06 — End: ?
  Filled 2024-02-06: qty 2

## 2024-02-06 MED ORDER — VERAPAMIL HCL 2.5 MG/ML IV SOLN
INTRAVENOUS | Status: DC | PRN
  Administered 2024-02-06 (×2): 10 mL via INTRA_ARTERIAL

## 2024-02-06 MED ORDER — SODIUM CHLORIDE 0.9 % WEIGHT BASED INFUSION
1.0000 mL/kg/h | INTRAVENOUS | Status: DC
  Administered 2024-02-06: 1 mL/kg/h via INTRAVENOUS

## 2024-02-06 MED ORDER — SODIUM CHLORIDE 0.9 % IV SOLN: 250.0000 mL | INTRAVENOUS | Status: AC | PRN

## 2024-02-06 MED ORDER — FUROSEMIDE 40 MG PO TABS
40.0000 mg | ORAL_TABLET | Freq: Every day | ORAL | Status: DC
  Administered 2024-02-07 – 2024-02-09 (×3): 40 mg via ORAL
  Filled 2024-02-06 (×3): qty 1

## 2024-02-06 MED ORDER — SODIUM CHLORIDE 0.9% FLUSH: 3.0000 mL | INTRAVENOUS | Status: DC | PRN

## 2024-02-06 MED ORDER — IOHEXOL 350 MG/ML SOLN
INTRAVENOUS | Status: DC | PRN
  Administered 2024-02-06: 58 mL via INTRA_ARTERIAL

## 2024-02-06 MED ORDER — POTASSIUM CHLORIDE CRYS ER 20 MEQ PO TBCR
30.0000 meq | EXTENDED_RELEASE_TABLET | Freq: Four times a day (QID) | ORAL | Status: AC
  Administered 2024-02-06 (×2): 30 meq via ORAL
  Filled 2024-02-06 (×2): qty 1

## 2024-02-06 MED ORDER — FENTANYL CITRATE (PF) 100 MCG/2ML IJ SOLN
INTRAMUSCULAR | Status: AC
  Filled 2024-02-06: qty 2

## 2024-02-06 MED ORDER — ASPIRIN 81 MG PO CHEW
81.0000 mg | CHEWABLE_TABLET | ORAL | Status: AC
  Administered 2024-02-06: 81 mg via ORAL
  Filled 2024-02-06: qty 1

## 2024-02-06 MED ORDER — LIDOCAINE HCL (PF) 1 % IJ SOLN
INTRAMUSCULAR | Status: DC | PRN
  Administered 2024-02-06: 2 mL

## 2024-02-06 SURGICAL SUPPLY — 11 items
CATH 5FR JL3.5 JR4 ANG PIG MP (CATHETERS) IMPLANT
CATH LAUNCHER 5F EBU3.0 (CATHETERS) IMPLANT
DEVICE RAD COMP TR BAND LRG (VASCULAR PRODUCTS) IMPLANT
ELECT DEFIB PAD ADLT CADENCE (PAD) IMPLANT
GLIDESHEATH SLEND SS 6F .021 (SHEATH) IMPLANT
GUIDEWIRE INQWIRE 1.5J.035X260 (WIRE) IMPLANT
GUIDEWIRE PRESSURE X 175 (WIRE) IMPLANT
KIT ESSENTIALS PG (KITS) IMPLANT
MAT PREVALON FULL STRYKER (MISCELLANEOUS) IMPLANT
PACK CARDIAC CATHETERIZATION (CUSTOM PROCEDURE TRAY) ×2 IMPLANT
SET ATX-X65L (MISCELLANEOUS) IMPLANT

## 2024-02-06 NOTE — Interval H&P Note (Signed)
 History and Physical Interval Note:  02/06/2024 9:13 AM  Lauren Delgado  has presented today for surgery, with the diagnosis of unstable angina.  The various methods of treatment have been discussed with the patient and family. After consideration of risks, benefits and other options for treatment, the patient has consented to  Procedure(s): LEFT HEART CATH AND CORONARY ANGIOGRAPHY (N/A) as a surgical intervention.  The patient's history has been reviewed, patient examined, no change in status, stable for surgery.  I have reviewed the patient's chart and labs.  Questions were answered to the patient's satisfaction.    Cath Lab Visit (complete for each Cath Lab visit)  Clinical Evaluation Leading to the Procedure:   ACS: Yes.  (Unstable angina)  Non-ACS:  N/A  Veryl Gottron Rolla Servidio

## 2024-02-06 NOTE — Plan of Care (Signed)
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
## 2024-02-06 NOTE — Progress Notes (Signed)
  Progress Note  Patient Name: Lauren Delgado Date of Encounter: 02/06/2024 [facility] Cardiologist: Hazle Lites, MD   Interval Summary   Saw patient as she was being taken down for cath  No active chest pain  Will reevaluate after results of cath   Vital Signs Vitals:   02/05/24 2100 02/06/24 0059 02/06/24 0430 02/06/24 0737  BP: 128/80 (!) 140/58 123/62 139/89  Pulse: 83 77 70 81  Resp: 16 18 18 20   Temp: (!) 97.4 F (36.3 C) 97.6 F (36.4 C) 98.5 F (36.9 C) 97.9 F (36.6 C)  TempSrc: Oral Oral Oral Oral  SpO2: 99% 99% 99% 99%  Weight:   (!) 142.9 kg   Height:        Intake/Output Summary (Last 24 hours) at 02/06/2024 0905 Last data filed at 02/06/2024 0735 Gross per 24 hour  Intake 444.93 ml  Output --  Net 444.93 ml      02/06/2024    4:30 AM 02/05/2024    5:21 PM 02/05/2024   11:00 AM  Last 3 Weights  Weight (lbs) 315 lb 0.6 oz 313 lb 4.4 oz 315 lb 7.7 oz  Weight (kg) 142.9 kg 142.1 kg 143.1 kg      Telemetry/ECG  Sinus rhythm, HR 70s, PVCs - Personally Reviewed  Physical Exam  GEN: No acute distress.   Neck: No JVD Cardiac: RRR, no murmurs, rubs, or gallops.  Respiratory: Clear to auscultation bilaterally. GI: Soft, nontender, non-distended  MS: No edema  Assessment & Plan   Unstable angina  CAD s/p three-vessel PCI with DES to LAD, ramus, RCA at age 38 Hyperlipidemia  Patient presented with chest pain that was increasing in frequency and severity History of extensive CAD requiring DES x 3 at age 75 11/07/2023: HDL 43; LDL Cholesterol 109 02/05/2024: ALT 10  Started on IV heparin  Continue ASA 81 mg daily + Plavix  75 mg daily  Continue Lipitor  80 mg daily   Scheduled for LHC today NPO since midnight   Hypertension Most recent BP 139/89 Continue CoReg  37.5 mg BID Continue Entresto  97-103 mg BID Continue Imdur  90 mg daily  Continue spironolactone  25 mg daily  Continue PO Lasix  40 mg BID -- resume post-cath   Diabetes  Most recent  A1C 9.9%, 11/2023 Continue Jardiance  10 mg daily Continue SSI while inpatient  Holding home Metformin     For questions or updates, please contact [facility] Please consult [email or web address] for contact info under       Signed, Jiles Mote, PA-C
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
## 2024-02-07 ENCOUNTER — Inpatient Hospital Stay (HOSPITAL_COMMUNITY)

## 2024-02-07 DIAGNOSIS — I5021 Acute systolic (congestive) heart failure: Secondary | ICD-10-CM | POA: Diagnosis not present

## 2024-02-07 LAB — CBC
HCT: 31.5 % — ABNORMAL LOW (ref 36.0–46.0)
Hemoglobin: 10.3 g/dL — ABNORMAL LOW (ref 12.0–15.0)
MCH: 30 pg (ref 26.0–34.0)
MCHC: 32.7 g/dL (ref 30.0–36.0)
MCV: 91.8 fL (ref 80.0–100.0)
Platelets: 299 10*3/uL (ref 150–400)
RBC: 3.43 MIL/uL — ABNORMAL LOW (ref 3.87–5.11)
RDW: 13.2 % (ref 11.5–15.5)
WBC: 7.3 10*3/uL (ref 4.0–10.5)
nRBC: 0 % (ref 0.0–0.2)

## 2024-02-07 LAB — BASIC METABOLIC PANEL WITH GFR
Anion gap: 8 (ref 5–15)
BUN: 25 mg/dL — ABNORMAL HIGH (ref 6–20)
CO2: 25 mmol/L (ref 22–32)
Calcium: 8.3 mg/dL — ABNORMAL LOW (ref 8.9–10.3)
Chloride: 105 mmol/L (ref 98–111)
Creatinine, Ser: 1.54 mg/dL — ABNORMAL HIGH (ref 0.44–1.00)
GFR, Estimated: 45 mL/min — ABNORMAL LOW (ref >60)
Glucose, Bld: 270 mg/dL — ABNORMAL HIGH (ref 70–99)
Potassium: 4 mmol/L (ref 3.5–5.1)
Sodium: 138 mmol/L (ref 135–145)

## 2024-02-07 LAB — MAGNESIUM: Magnesium: 1.8 mg/dL (ref 1.7–2.4)

## 2024-02-07 LAB — GLUCOSE, CAPILLARY
Glucose-Capillary: 368 mg/dL — ABNORMAL HIGH (ref 70–99)
Glucose-Capillary: 456 mg/dL — ABNORMAL HIGH (ref 70–99)
Glucose-Capillary: 317 mg/dL — ABNORMAL HIGH (ref 70–99)
Glucose-Capillary: 164 mg/dL — ABNORMAL HIGH (ref 70–99)

## 2024-02-07 LAB — ECHOCARDIOGRAM LIMITED
Height: 63 in
S' Lateral: 3.3 cm
Weight: 5104.09 [oz_av]

## 2024-02-07 LAB — HEPARIN LEVEL (UNFRACTIONATED): Heparin Unfractionated: 0.42 [IU]/mL (ref 0.30–0.70)

## 2024-02-07 MED ORDER — INSULIN ASPART 100 UNIT/ML IJ SOLN
0.0000 [IU] | Freq: Three times a day (TID) | INTRAMUSCULAR | Status: DC
  Administered 2024-02-07: 15 [IU] via SUBCUTANEOUS
  Administered 2024-02-07: 20 [IU] via SUBCUTANEOUS
  Administered 2024-02-08: 7 [IU] via SUBCUTANEOUS
  Administered 2024-02-08: 4 [IU] via SUBCUTANEOUS
  Administered 2024-02-09: 11 [IU] via SUBCUTANEOUS

## 2024-02-07 MED ORDER — INSULIN ASPART 100 UNIT/ML IJ SOLN: 0.0000 [IU] | Freq: Three times a day (TID) | INTRAMUSCULAR | Status: DC

## 2024-02-07 MED ORDER — INSULIN GLARGINE-YFGN 100 UNIT/ML ~~LOC~~ SOLN
20.0000 [IU] | Freq: Every day | SUBCUTANEOUS | Status: DC
  Administered 2024-02-07 – 2024-02-08 (×2): 20 [IU] via SUBCUTANEOUS
  Filled 2024-02-07 (×3): qty 0.2

## 2024-02-07 MED ORDER — INSULIN ASPART 100 UNIT/ML IJ SOLN: 0.0000 [IU] | Freq: Every day | INTRAMUSCULAR | Status: DC

## 2024-02-07 NOTE — Heart Team MDD (Signed)
   Heart Team Multi-Disciplinary Discussion  Patient: Lauren Delgado  DOB: 13-Apr-1986  MRN: [id number]   Date: 02/07/2024  12:35 PM    Attendees: Interventional Cardiology: Alyssa Backbone, MD Randene Bustard, MD Knox Perl, MD Fransico Ivy, MD Peter Swaziland, MD Antionette Kirks, MD Sammy Crisp, MD Burney Carter, MD  Cardiothoracic Surgery: Starleen Eastern, MD Adair Hollingshead, MD     Patient History: 38 year old female with intermittent chest discomfort which feels like indigestion that is mildly relieved with nitroglycerin . Presented to the ED on 06/04 AM with chest pain radiating down her right arm with shortness of breath for a few days that had worsened the night of 06/03. She reports that she has had episodes of severe chest pain for the last few months that lead to ED visits several times when she reports the pain was unbearable (03/05 with elevated troponin and admission, 04/01). All of these were accompanied by radiation to the right arm and diaphoresis, every once in a while she would develop shortness of breath. Denies nausea, vomiting, and LOC. EKG on arrival did not show signs of acute ischemia. Troponin T (high sensitivity) was less than 15, proBNP was elevated at 797. She underwent cardiac catheterization on 06/05.    Risk Factors: Diabetes Mellitus History of Stroke or TIA Hypertension Hyperlipidemia  Combined systolic and diastolic HF, obesity class 3, CAD with NSTEMI (s/p LAD, Ramus, and ostial RCA stenting 2019)      Review of Prior Angiography and PCI Procedures: Left heart cath and coronary angiography from 02/06/2024 images were reviewed and discussed in detail including: Severe multivessel CAD, including 70% mid LAD (RFR 0.81), 40% ostial/proximal LCx, and 50% proximal RCA stenoses, as well as chronic total occlusions of proximal/mid LCx and mid RCA. Widely patent LAD and RCA stents. Patent overlapping ramus intermedius stents with focal in-stent  restenosis of up to 70-80% (RFR 0.60). Moderately reduced left ventricular systolic function with mid anterior/anterolateral akinesis (LVEF 35-45%).    Discussion: After presentation, consideration of treatment options occurred with PCI versus referral for CABG. Discussion surrounded severe three-vessel CAD, reduced LVEF, history of diabetes mellitus, and morbid obesity. Consensus from the team was for referral to CTS for CABG. In the event that CTS deems patient is not a surgical candidate, then consideration of PCI to LAD and DCB to Ramus should be attempted.    Recommendations: CABG      Andreas Kays, RN  02/07/2024 12:35 PM

## 2024-02-07 NOTE — Care Management (Signed)
  Transition of Care [facility]) Screening Note   Patient Details  Name: Lauren Delgado Date of Birth: 12/16/1985   [facility]) CM/SW Contact:    Ronni Colace, RN Phone Number: 02/07/2024, 8:23 AM    Transition of Care [facility]) has reviewed patient and no TOC needs have been identified at this time. We will continue to monitor patient advancement through interdisciplinary progression rounds. If new patient transition needs arise, please place a TOC consult.  Has PCP

## 2024-02-07 NOTE — Progress Notes (Signed)
 Echocardiogram 2D Echocardiogram has been performed.  Farley Honer, RDCS 02/07/2024, 2:54 PM
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
## 2024-02-07 NOTE — Progress Notes (Signed)
      [address]       Lauren Delgado [id number]             [phone]      Please refer to detailed consultation note from 02/06/2024.  I reviewed Lauren Delgado' catheterization images and examined her.  She is a morbidly obese 38 year old woman with a BMI of 56.  She has uncontrolled diabetes with a hemoglobin A1c of 9.  Blood sugars have been running as high as 450 today.  She is a poor operative candidate.  In addition she has moderate LAD disease.  I think she has enough flow in the native artery that a LIMA graft would probably not stay open.  I would not recommend coronary bypass grafting.  Lauren Aloe Luna Salinas, MD [facility] [phone]

## 2024-02-07 NOTE — Plan of Care (Signed)

## 2024-02-07 NOTE — Progress Notes (Signed)
  Progress Note  Patient Name: Lauren Delgado Date of Encounter: 02/07/2024 [facility] Cardiologist: Hazle Lites, MD   Interval Summary   Seems depressed today, no chest pain. Wants to shower. Needs better CBG control.   Vital Signs Vitals:   02/07/24 0230 02/07/24 0525 02/07/24 0644 02/07/24 0939  BP: 137/80 137/84  (!) 154/97  Pulse: 74 81  80  Resp: 18 18  17   Temp: (!) 97.5 F (36.4 C) 98.5 F (36.9 C)  97.7 F (36.5 C)  TempSrc: Oral Oral  Oral  SpO2: 97% 96%  96%  Weight:   (!) 144.7 kg   Height:        Intake/Output Summary (Last 24 hours) at 02/07/2024 0949 Last data filed at 02/07/2024 5409 Gross per 24 hour  Intake 200 ml  Output 2600 ml  Net -2400 ml      02/07/2024    6:44 AM 02/06/2024    4:30 AM 02/05/2024    5:21 PM  Last 3 Weights  Weight (lbs) 319 lb 0.1 oz 315 lb 0.6 oz 313 lb 4.4 oz  Weight (kg) 144.7 kg 142.9 kg 142.1 kg      Telemetry/ECG  Sinus rhythm - Personally Reviewed  Physical Exam  GEN: No acute distress, morbidly obese.   Neck: No JVD Cardiac: RRR, no murmurs, rubs, or gallops.  Respiratory: Clear to auscultation bilaterally. GI: Soft, nontender, non-distended  MS: No edema  Assessment & Plan   Unstable angina  CAD s/p three-vessel PCI with DES to LAD, ramus, RCA at age 38 Hyperlipidemia  Patient presented with chest pain that was increasing in frequency and severity History of extensive CAD requiring DES x 3 at age 41 11/07/2023: HDL 43; LDL Cholesterol 109 02/05/2024: ALT 10  Started on IV heparin  Continue ASA 81 mg daily + Plavix  75 mg daily  Continue Lipitor  80 mg daily   Cath showed multivessel dz with ISR - plan for CT surgery evaluation  Hypertension Most recent BP 139/89 Continue CoReg  37.5 mg BID Continue Entresto  97-103 mg BID Continue Imdur  90 mg daily  Continue spironolactone  25 mg daily  Continue PO Lasix  40 mg BID -- resume post-cath   Diabetes  Most recent A1C 9.9%, 11/2023 Continue Jardiance   10 mg daily Continue SSI while inpatient, intensive scale Add Semglee  20U QHS  - BG AC/HS Holding home Metformin   Carb modified diet   For questions or updates, please contact [facility] Please consult [email or web address] for contact info under   Hazle Lites, MD, [facility], FNLA, FACP  [place]  [facility]  Medical Director of the [facility] Lip[facility] &  Cardiovascular Risk Reduction Clinic Diplomate of the American Board of Clinical Lipidology Attending Cardiologist  Direct Dial : [phone]  Fax: [phone]  Website:  [email or web address]  Hazle Lites, MD

## 2024-02-07 NOTE — Inpatient Diabetes Management (Signed)
 Inpatient Diabetes Program Recommendations  AACE/ADA: New Consensus Statement on Inpatient Glycemic Control (2015)  Target Ranges:  Prepandial:   less than 140 mg/dL      Peak postprandial:   less than 180 mg/dL (1-2 hours)      Critically ill patients:  140 - 180 mg/dL   Lab Results  Component Value Date   GLUCAP 456 (H) 02/07/2024   HGBA1C 9.7 (H) 02/06/2024    Latest Reference Range & Units 02/06/24 12:17 02/06/24 14:24 02/06/24 18:11 02/07/24 09:46 02/07/24 11:52  Glucose-Capillary 70 - 99 mg/dL 027 (H) 253 (H) 664 (H) 368 (H) 456 (H)  (H): Data is abnormally high Review of Glycemic Control  Diabetes history: DM2 Outpatient Diabetes medications: Humulin  70/30 insulin  15 units BID, Glipizide  5 mg daily, Jardiance  10 mg daily Current orders for Inpatient glycemic control: Semglee  20 units at HS, Novolog  0-20 units correction scale TID, Novolog  0-5 units HS scale, Jardiance  10 mg daily  Inpatient Diabetes Program Recommendations:   Noted that blood sugars have been greater than 300 mg/dl. Noted that Semglee  20 units will start at HS today after getting 70/30 insulin  15 units last night. The Novolog  correction scale has been increased to 0-20 units TID, 0-5 units HS scale.  If blood sugars continue to be elevated, recommend increasing Semglee  to 30 units at HS. Semglee  30 units would be weight based (144.7 kg X 0.2 units per kg = 28.94 units)  Nick Barman RN BSN CDE Diabetes Coordinator Pager: [phone]  8am-5pm

## 2024-02-08 DIAGNOSIS — I200 Unstable angina: Secondary | ICD-10-CM | POA: Diagnosis not present

## 2024-02-08 LAB — CBC
HCT: 30.8 % — ABNORMAL LOW (ref 36.0–46.0)
Hemoglobin: 10 g/dL — ABNORMAL LOW (ref 12.0–15.0)
MCH: 29.5 pg (ref 26.0–34.0)
MCHC: 32.5 g/dL (ref 30.0–36.0)
MCV: 90.9 fL (ref 80.0–100.0)
Platelets: 283 10*3/uL (ref 150–400)
RBC: 3.39 MIL/uL — ABNORMAL LOW (ref 3.87–5.11)
RDW: 13.2 % (ref 11.5–15.5)
WBC: 8.8 10*3/uL (ref 4.0–10.5)
nRBC: 0 % (ref 0.0–0.2)

## 2024-02-08 LAB — GLUCOSE, CAPILLARY
Glucose-Capillary: 247 mg/dL — ABNORMAL HIGH (ref 70–99)
Glucose-Capillary: 196 mg/dL — ABNORMAL HIGH (ref 70–99)
Glucose-Capillary: 102 mg/dL — ABNORMAL HIGH (ref 70–99)
Glucose-Capillary: 509 mg/dL (ref 70–99)
Glucose-Capillary: 304 mg/dL — ABNORMAL HIGH (ref 70–99)

## 2024-02-08 LAB — BASIC METABOLIC PANEL WITH GFR
Anion gap: 7 (ref 5–15)
BUN: 33 mg/dL — ABNORMAL HIGH (ref 6–20)
CO2: 25 mmol/L (ref 22–32)
Calcium: 8.2 mg/dL — ABNORMAL LOW (ref 8.9–10.3)
Chloride: 101 mmol/L (ref 98–111)
Creatinine, Ser: 1.62 mg/dL — ABNORMAL HIGH (ref 0.44–1.00)
GFR, Estimated: 42 mL/min — ABNORMAL LOW (ref >60)
Glucose, Bld: 283 mg/dL — ABNORMAL HIGH (ref 70–99)
Potassium: 3.5 mmol/L (ref 3.5–5.1)
Sodium: 133 mmol/L — ABNORMAL LOW (ref 135–145)

## 2024-02-08 LAB — HEPARIN LEVEL (UNFRACTIONATED)
Heparin Unfractionated: 0.28 [IU]/mL — ABNORMAL LOW (ref 0.30–0.70)
Heparin Unfractionated: 0.13 [IU]/mL — ABNORMAL LOW (ref 0.30–0.70)

## 2024-02-08 MED ORDER — INSULIN ASPART 100 UNIT/ML IJ SOLN
8.0000 [IU] | Freq: Once | INTRAMUSCULAR | Status: AC
  Administered 2024-02-08: 8 [IU] via SUBCUTANEOUS

## 2024-02-08 MED ORDER — CLOPIDOGREL BISULFATE 75 MG PO TABS
75.0000 mg | ORAL_TABLET | Freq: Every day | ORAL | Status: DC
  Administered 2024-02-08 – 2024-02-09 (×2): 75 mg via ORAL
  Filled 2024-02-08 (×2): qty 1

## 2024-02-08 NOTE — Plan of Care (Signed)
  Problem: Clinical Measurements: Goal: Diagnostic test results will improve Outcome: Progressing   Problem: Clinical Measurements: Goal: Will remain free from infection Outcome: Progressing   Problem: Clinical Measurements: Goal: Cardiovascular complication will be avoided Outcome: Progressing

## 2024-02-08 NOTE — Progress Notes (Signed)
  Progress Note  Patient Name: KALLIOPI COUPLAND Date of Encounter: 02/08/2024 [facility] Cardiologist: Hazle Lites, MD   Interval Summary    No angina. Turned down for CABG Discussed BS control  Vital Signs Vitals:   02/08/24 0020 02/08/24 0510 02/08/24 0609 02/08/24 0900  BP: 114/70 136/70    Pulse: 80 78    Resp: 18 18  18   Temp: 98.5 F (36.9 C) 98.3 F (36.8 C)    TempSrc: Oral Oral    SpO2: 98% 98%    Weight:   (!) 144.5 kg   Height:       No intake or output data in the 24 hours ending 02/08/24 0942     02/08/2024    6:09 AM 02/07/2024    6:44 AM 02/06/2024    4:30 AM  Last 3 Weights  Weight (lbs) 318 lb 9.6 oz 319 lb 0.1 oz 315 lb 0.6 oz  Weight (kg) 144.516 kg 144.7 kg 142.9 kg      Telemetry/ECG  Sinus rhythm - Personally Reviewed  Physical Exam  GEN: No acute distress, morbidly obese.   Neck: No JVD Cardiac: RRR, no murmurs, rubs, or gallops.  Respiratory: Clear to auscultation bilaterally. GI: Soft, nontender, non-distended  MS: No edema  Assessment & Plan   Unstable angina  CAD s/p three-vessel PCI with DES to LAD, ramus, RCA at age 40 Hyperlipidemia  Patient presented with chest pain that was increasing in frequency and severity History of extensive CAD requiring DES x 3 at age 33 11/07/2023: HDL 43; LDL Cholesterol 109 02/05/2024: ALT 10  Started on IV heparin  Continue ASA 81 mg daily + Plavix  75 mg daily  Continue Lipitor  80 mg daily   Cath showed multivessel dz with ISR  Turned down for CABG by Dr Luna Salinas She R/O for MI this admission Resume Plavix   Hypertension Most recent BP 136/70 mmHg Continue CoReg  37.5 mg BID Continue Entresto  97-103 mg BID Continue Imdur  90 mg daily  Continue spironolactone  25 mg daily  Continue PO Lasix  40 mg BID -- resume post-cath   Diabetes  Most recent A1C 9.9%, 11/2023 Continue Jardiance  10 mg daily Continue SSI while inpatient, intensive scale She has diarrhea with GLP-1 meds  Resume  home metformin  now 48 hours post cath Carb modified diet A1c 9.7 needs dedicated endocrinologist    Will send chart message to Dr Maximo Spar and interventionalist. Not clear that she needs to stay weekend for intervention on Monday    Janelle Mediate, MD

## 2024-02-08 NOTE — Progress Notes (Signed)
 PHARMACY - ANTICOAGULATION CONSULT NOTE  Pharmacy Consult for heparin  Indication: chest pain/ACS  Allergies  Allergen Reactions   Metformin  And Related Diarrhea   Ozempic  (0.25 Or 0.5 Mg-Dose) [Semaglutide (0.25 Or 0.5mg -Dos)] Diarrhea    Patient Measurements: Height: 5\' 3"  (160 cm) Weight: (!) 144.7 kg (319 lb 0.1 oz) IBW/kg (Calculated) : 52.4 HEPARIN  DW (KG): 88.5  Vital Signs: Temp: 98.5 F (36.9 C) (06/07 0020) Temp Source: Oral (06/07 0020) BP: 114/70 (06/07 0020) Pulse Rate: 80 (06/07 0020)  Labs: Recent Labs    02/06/24 0238 02/06/24 2139 02/07/24 0252 02/08/24 0229  HGB 10.9*  --  10.3* 10.0*  HCT 32.7*  --  31.5* 30.8*  PLT 282  --  299 283  HEPARINUNFRC 0.37 0.33 0.42 0.28*  CREATININE 1.45*  --  1.54* 1.62*    Estimated Creatinine Clearance: 67.7 mL/min (A) (by C-G formula based on SCr of 1.62 mg/dL (H)).  Assessment: 35 yof presented to the ED with CP. To start IV heparin  for unstable angina. Baseline Hgb is slightly low but platelets are WNL. She is not on anticoagulation PTA.   6/6 AM heparin  level now subtherapeutic on 1600 units/hr. Per RN, no issues with running continuously or signs/symptoms of bleeding. CBC stable  Goal of Therapy:  Heparin  level 0.3-0.7 units/ml Monitor platelets by anticoagulation protocol: Yes   Plan:  Increase heparin  at 1700 units/hr   8h heparin  level Daily heparin  level and CBC F/u plans, timing of CABG  Thank you for allowing pharmacy to participate in this patient's care,  Young Hensen, PharmD, BCPS Clinical Pharmacist 02/08/2024 4:40 AM

## 2024-02-08 NOTE — Progress Notes (Signed)
 PHARMACY - ANTICOAGULATION CONSULT NOTE  Pharmacy Consult for heparin  Indication: chest pain/ACS  Allergies  Allergen Reactions   Metformin  And Related Diarrhea   Ozempic  (0.25 Or 0.5 Mg-Dose) [Semaglutide (0.25 Or 0.5mg -Dos)] Diarrhea    Patient Measurements: Height: 5\' 3"  (160 cm) Weight: (!) 144.5 kg (318 lb 9.6 oz) IBW/kg (Calculated) : 52.4 HEPARIN  DW (KG): 88.5  Vital Signs: Temp: 98.3 F (36.8 C) (06/07 0510) Temp Source: Oral (06/07 0510) BP: 136/70 (06/07 0510) Pulse Rate: 78 (06/07 0510)  Labs: Recent Labs    02/06/24 0238 02/06/24 2139 02/07/24 0252 02/08/24 0229  HGB 10.9*  --  10.3* 10.0*  HCT 32.7*  --  31.5* 30.8*  PLT 282  --  299 283  HEPARINUNFRC 0.37 0.33 0.42 0.28*  CREATININE 1.45*  --  1.54* 1.62*    Estimated Creatinine Clearance: 67.6 mL/min (A) (by C-G formula based on SCr of 1.62 mg/dL (H)).  Assessment: 74 yof presented to the ED with CP. To start IV heparin  for unstable angina. Baseline Hgb is slightly low but platelets are WNL. She is not on anticoagulation PTA.   Per Cardiology, discontinuing heparin  as patient is not a candidate for CABG (poor candidate). Per RN, heparin  stopped this am ~10am.  Goal of Therapy:  Heparin  level 0.3-0.7 units/ml Monitor platelets by anticoagulation protocol: Yes   Plan:  Discontinue heparin  infusion.   Harvest Lineman, PharmD PGY1 Pharmacy Resident

## 2024-02-08 NOTE — Plan of Care (Signed)

## 2024-02-09 ENCOUNTER — Other Ambulatory Visit: Payer: Self-pay | Admitting: Physician Assistant

## 2024-02-09 DIAGNOSIS — E1165 Type 2 diabetes mellitus with hyperglycemia: Secondary | ICD-10-CM

## 2024-02-09 DIAGNOSIS — I200 Unstable angina: Secondary | ICD-10-CM | POA: Diagnosis not present

## 2024-02-09 LAB — CBC
HCT: 30.7 % — ABNORMAL LOW (ref 36.0–46.0)
Hemoglobin: 10.1 g/dL — ABNORMAL LOW (ref 12.0–15.0)
MCH: 29.9 pg (ref 26.0–34.0)
MCHC: 32.9 g/dL (ref 30.0–36.0)
MCV: 90.8 fL (ref 80.0–100.0)
Platelets: 277 10*3/uL (ref 150–400)
RBC: 3.38 MIL/uL — ABNORMAL LOW (ref 3.87–5.11)
RDW: 13.2 % (ref 11.5–15.5)
WBC: 8.1 10*3/uL (ref 4.0–10.5)
nRBC: 0 % (ref 0.0–0.2)

## 2024-02-09 LAB — BASIC METABOLIC PANEL WITH GFR
Anion gap: 9 (ref 5–15)
BUN: 33 mg/dL — ABNORMAL HIGH (ref 6–20)
CO2: 26 mmol/L (ref 22–32)
Calcium: 8.5 mg/dL — ABNORMAL LOW (ref 8.9–10.3)
Chloride: 102 mmol/L (ref 98–111)
Creatinine, Ser: 1.6 mg/dL — ABNORMAL HIGH (ref 0.44–1.00)
GFR, Estimated: 43 mL/min — ABNORMAL LOW (ref >60)
Glucose, Bld: 263 mg/dL — ABNORMAL HIGH (ref 70–99)
Potassium: 3.6 mmol/L (ref 3.5–5.1)
Sodium: 137 mmol/L (ref 135–145)

## 2024-02-09 LAB — GLUCOSE, CAPILLARY: Glucose-Capillary: 287 mg/dL — ABNORMAL HIGH (ref 70–99)

## 2024-02-09 NOTE — Plan of Care (Signed)

## 2024-02-09 NOTE — Discharge Summary (Addendum)
 Discharge Summary   Patient ID: Lauren Delgado MRN: [id number]; DOB: [birth year]  Admit date: 02/05/2024 Discharge date: 02/09/2024  PCP:  Elvira Hammersmith, MD   [facility] Providers Cardiologist:  Hazle Lites, MD       Discharge Diagnoses  Principal Problem:   Unstable angina [facility]) Active Problems:   Chest pain   Chronic heart failure with mildly reduced ejection fraction (HFmrEF, 41-49%) [facility])   Diagnostic Studies/Procedures   Cath 02/06/2024 Conclusions: Severe multivessel CAD, as detailed below, including 70% mid LAD (RFR 0.81), 40% ostial/proximal LCx, and 50% proximal RCA stenoses, as well as chronic total occlusions of proximal/mid LCx and mid RCA. Widely patent LAD and RCA stents. Patent overlapping ramus intermedius stents with focal in-stent restenosis of up to 70-80% (RFR 0.60). Moderately reduced left ventricular systolic function with mid anterior/anterolateral akinesis (LVEF 35-45%). Normal left ventricular filling pressure (LVEDP 10 mmHg).   Recommendations: Given recurrent severe three-vessel CAD, reduced LVEF, and history of diabetes mellitus, will obtain cardiac surgery consultation for CABG and review the patient's images at tomorrow's HeartTeam meeting. Hold clopidogrel  pending cardiac surgery evaluation. Resume heparin  infusion 2 hours after TR band has been removed. Maintain net even fluid balance. Continue aggressive secondary prevention of CAD and antianginal therapy. Escalate goal-directed medical therapy for HFmrEF, as tolerated.   Echo 02/07/2024 1. Posterior lateral hypokinesis . Left ventricular ejection fraction, by  estimation, is 50 to 55%. The left ventricle has low normal function. The  left ventricle has no regional wall motion abnormalities.   2. Right ventricular systolic function is normal. The right ventricular  size is normal.   3. The mitral valve is abnormal. No evidence of mitral valve  regurgitation. No evidence  of mitral stenosis.   4. The aortic valve is tricuspid. Aortic valve regurgitation is not  visualized. No aortic stenosis is present.   5. The inferior vena cava is normal in size with greater than 50%  respiratory variability, suggesting right atrial pressure of 3 mmHg.    _____________   History of Present Illness   Lauren Delgado is a 38 y.o. female with a history of DM, HLD, HTN, morbid obesity. In 2019 she presented with NSTEMI. She underwent cardiac cath which demonstrated severe CAD. She underwent stenting of the LAD, ramus intermediate and ostial RCA. She has done well overall since then. Was seen in office 2 weeks ago with some chest pain. PET CT was ordered. Since then she has had several episodes of chest pain radiating to her right arm associated with some SOB. Today she developed sweats and was clammy so came to ED. Typically chest pain responds to sl Ntg. It is not always exertional. She is currently pain free on IV heparin . She did have Echo in March showing EF 45%. Similar to Echo in 2023.     Hospital Course   Consultants: Dr. Luna Salinas of CT surgery   Patient was admitted with unstable angina.  She underwent cardiac catheterization on 02/06/2024 which showed severe multivessel CAD with 70% mid LAD lesion IFR 0.81, 40% ostial to proximal left circumflex lesion, and 50% proximal RCA stenosis, CTO proximal to mid left circumflex and the mid RCA.  Widely patent LAD and RCA stents.  Patent overlapping ramus intermedius stents with 70 to 80% focal in-stent restenosis RFR 0.60.  Moderately reduced EF of 35 to 45%, LVEDP 10 mmHg.  CT surgery was consulted.  Patient's Plavix  was held pending cardiac surgical evaluation.  She was seen by  Dr. Luna Salinas on 02/06/2024 who felt the patient is a poor candidate due to morbid obesity and uncontrolled diabetes.  Despite FFR measurement, the suspicion is due to competitive blood flow from the native vessel, proposed LIMA-LAD graft has high  likelihood of shutting down.  Echocardiogram obtained on 02/07/2024 showed EF 50 to 55%, posterior lateral hypokinesis, normal RV.  Patient was reevaluated by Dr. Stann Earnest in the morning of 02/09/2024 at which time she denies any chest pain.  Dr. Stann Earnest felt the best option would be to consider stenting of the ramus and LAD, although could consider second opinion by Dr. Deloise Ferries.  Given age, weight and poorly controlled diabetes, ideally would avoid CABG. Otherwise, patient was stable for discharge with follow up and future discussion with interventionalist. Patient was agreeable with endocrinology referral, I am placed the referral prior to discharge.      Did the patient have an acute coronary syndrome (MI, NSTEMI, STEMI, etc) this admission?:  No                               Did the patient have a percutaneous coronary intervention (stent / angioplasty)?:  No.          _____________  Discharge Vitals Blood pressure (!) 133/91, pulse 76, temperature 98.3 F (36.8 C), temperature source Oral, resp. rate 20, height 5\' 3"  (1.6 m), weight (!) 143.6 kg, last menstrual period 10/15/2023, SpO2 98%.  Filed Weights   02/07/24 0644 02/08/24 0609 02/09/24 0603  Weight: (!) 144.7 kg (!) 144.5 kg (!) 143.6 kg    Labs & Radiologic Studies  CBC Recent Labs    02/08/24 0229 02/09/24 0307  WBC 8.8 8.1  HGB 10.0* 10.1*  HCT 30.8* 30.7*  MCV 90.9 90.8  PLT 283 277   Basic Metabolic Panel Recent Labs    23/76/28 0252 02/08/24 0229 02/09/24 0307  NA 138 133* 137  K 4.0 3.5 3.6  CL 105 101 102  CO2 25 25 26   GLUCOSE 270* 283* 263*  BUN 25* 33* 33*  CREATININE 1.54* 1.62* 1.60*  CALCIUM  8.3* 8.2* 8.5*  MG 1.8  --   --    Liver Function Tests No results for input(s): "AST", "ALT", "ALKPHOS", "BILITOT", "PROT", "ALBUMIN" in the last 72 hours. No results for input(s): "LIPASE", "AMYLASE" in the last 72 hours. High Sensitivity Troponin:   No results for input(s): "TROPONINIHS" in the last 720  hours.  Recent Labs  Lab 02/05/24 0851 02/05/24 1204  TRNPT <15 <15    BNP Invalid input(s): "POCBNP" No results for input(s): "PROBNP" in the last 72 hours.  No results for input(s): "BNP" in the last 72 hours.  D-Dimer No results for input(s): "DDIMER" in the last 72 hours. Hemoglobin A1C No results for input(s): "HGBA1C" in the last 72 hours. Fasting Lipid Panel No results for input(s): "CHOL", "HDL", "LDLCALC", "TRIG", "CHOLHDL", "LDLDIRECT" in the last 72 hours. Lipoprotein (a)  Date/Time Value Ref Range Status  11/08/2023 04:42 AM 44.3 (H) <75.0 nmol/L Final    Comment:    (NOTE) Note:  Values greater than or equal to 75.0 nmol/L may       indicate an independent risk factor for CHD,       but must be evaluated with caution when applied       to non-Caucasian populations due to the       influence of genetic factors on Lp(a) across  ethnicities. Performed At: [facility] [address] [place], [place] [id number] Pearlean Botts MD HK:[id number]     Thyroid  Function Tests No results for input(s): "TSH", "T4TOTAL", "T3FREE", "THYROIDAB" in the last 72 hours.  Invalid input(s): "FREET3" _____________  ECHOCARDIOGRAM LIMITED Result Date: 02/07/2024    ECHOCARDIOGRAM LIMITED REPORT   Patient Name:   Lauren Delgado Date of Exam: 02/07/2024 Medical Rec #:  [id number]          Height:       63.0 in Accession #:    [id number]         Weight:       319.0 lb Date of Birth:  28-Nov-1985           BSA:          2.358 m Patient Age:    37 years           BP:           166/101 mmHg Patient Gender: F                  HR:           73 bpm. Exam Location:  Inpatient Procedure: 2D Echo, Limited Echo, Color Doppler and Cardiac Doppler (Both            Spectral and Color Flow Doppler were utilized during procedure). Indications:    CHF- Acute systolic I50.21  History:        Patient has prior history of Echocardiogram examinations, most                 recent 11/07/2023. CHF, CAD,  Signs/Symptoms:Chest Pain; Risk                 Factors:Hypertension, Diabetes and Dyslipidemia.  Sonographer:    Kip Peon RDCS Referring Phys: [id number] Aviva Lemmings [name]  Sonographer Comments: Patient is obese. IMPRESSIONS  1. Posterior lateral hypokinesis . Left ventricular ejection fraction, by estimation, is 50 to 55%. The left ventricle has low normal function. The left ventricle has no regional wall motion abnormalities.  2. Right ventricular systolic function is normal. The right ventricular size is normal.  3. The mitral valve is abnormal. No evidence of mitral valve regurgitation. No evidence of mitral stenosis.  4. The aortic valve is tricuspid. Aortic valve regurgitation is not visualized. No aortic stenosis is present.  5. The inferior vena cava is normal in size with greater than 50% respiratory variability, suggesting right atrial pressure of 3 mmHg. FINDINGS  Left Ventricle: Posterior lateral hypokinesis. Left ventricular ejection fraction, by estimation, is 50 to 55%. The left ventricle has low normal function. The left ventricle has no regional wall motion abnormalities. The left ventricular internal cavity size was normal in size. There is no left ventricular hypertrophy. Right Ventricle: The right ventricular size is normal. No increase in right ventricular wall thickness. Right ventricular systolic function is normal. Left Atrium: Left atrial size was normal in size. Right Atrium: Right atrial size was normal in size. Pericardium: There is no evidence of pericardial effusion. Mitral Valve: The mitral valve is abnormal. There is mild thickening of the mitral valve leaflet(s). There is mild calcification of the mitral valve leaflet(s). Mild mitral annular calcification. No evidence of mitral valve stenosis. Tricuspid Valve: The tricuspid valve is normal in structure. Tricuspid valve regurgitation is not demonstrated. No evidence of tricuspid stenosis. Aortic Valve: The aortic valve is tricuspid.  Aortic valve regurgitation is not visualized. No aortic stenosis  is present. Pulmonic Valve: The pulmonic valve was normal in structure. Pulmonic valve regurgitation is not visualized. No evidence of pulmonic stenosis. Aorta: The aortic root is normal in size and structure. Venous: The inferior vena cava is normal in size with greater than 50% respiratory variability, suggesting right atrial pressure of 3 mmHg. IAS/Shunts: No atrial level shunt detected by color flow Doppler. Additional Comments: Spectral Doppler performed. Color Doppler performed.  LEFT VENTRICLE PLAX 2D LVIDd:         4.60 cm LVIDs:         3.30 cm LV PW:         1.00 cm LV IVS:        1.00 cm LVOT diam:     2.10 cm LV SV:         55 LV SV Index:   23 LVOT Area:     3.46 cm  LEFT ATRIUM         Index LA diam:    3.20 cm 1.36 cm/m  AORTIC VALVE LVOT Vmax:   84.90 cm/s LVOT Vmean:  56.100 cm/s LVOT VTI:    0.159 m  AORTA Ao Root diam: 2.40 cm Ao Asc diam:  2.80 cm  SHUNTS Systemic VTI:  0.16 m Systemic Diam: 2.10 cm Janelle Mediate MD Electronically signed by Janelle Mediate MD Signature Date/Time: 02/07/2024/3:00:00 PM    Final    CARDIAC CATHETERIZATION Result Date: 02/06/2024 Conclusions: Severe multivessel CAD, as detailed below, including 70% mid LAD (RFR 0.81), 40% ostial/proximal LCx, and 50% proximal RCA stenoses, as well as chronic total occlusions of proximal/mid LCx and mid RCA. Widely patent LAD and RCA stents. Patent overlapping ramus intermedius stents with focal in-stent restenosis of up to 70-80% (RFR 0.60). Moderately reduced left ventricular systolic function with mid anterior/anterolateral akinesis (LVEF 35-45%). Normal left ventricular filling pressure (LVEDP 10 mmHg). Recommendations: Given recurrent severe three-vessel CAD, reduced LVEF, and history of diabetes mellitus, will obtain cardiac surgery consultation for CABG and review the patient's images at tomorrow's HeartTeam meeting. Hold clopidogrel  pending cardiac surgery  evaluation. Resume heparin  infusion 2 hours after TR band has been removed. Maintain net even fluid balance. Continue aggressive secondary prevention of CAD and antianginal therapy. Escalate goal-directed medical therapy for HFmrEF, as tolerated. Sammy Crisp, MD [facility]  DG Chest 2 View Result Date: 02/05/2024 CLINICAL DATA:  Chest pain EXAM: CHEST - 2 VIEW COMPARISON:  December 03, 2023 FINDINGS: Persistent bilateral reticular interstitial infiltrates which correlates with the CT of the chest October 10/23/2021 and correlates with reticulonodular chronic pulmonary infiltrates that can be seen in patients with a healed varicella pneumonia. No other infiltrates or consolidations. Heart and mediastinum normal IMPRESSION: Chronic interstitial lung reticulonodular pattern as described may correlate with healed varicella pneumonia. No acute cardiopulmonary infiltrates Electronically Signed   By: Fredrich Jefferson M.D.   On: 02/05/2024 10:07    Disposition Pt is being discharged home today in good condition.  Follow-up Plans & Appointments  Follow-up Information     Hazle Lites, MD Follow up on 02/25/2024.   Specialty: Cardiology Why: 3:20PM. Cardiology follow up Contact information: [address] [place] [address] [phone]                Discharge Instructions     AMB Referral to [facility]   Complete by: As directed    Reason For Referral: Lipids       Discharge Medications Allergies as of 02/09/2024  Reactions   Metformin  And Related Diarrhea   Ozempic  (0.25 Or 0.5 Mg-dose) [semaglutide (0.25 Or 0.5mg -dos)] Diarrhea        Medication List     TAKE these medications    aspirin  EC 81 MG tablet Take 81 mg by mouth daily. Swallow whole.   atorvastatin  80 MG tablet Commonly known as: LIPITOR  Take 1 tablet (80 mg total) by mouth daily.   carvedilol  12.5 MG tablet Commonly known as: COREG  Take 3 tablets (37.5 mg total) by mouth 2 (two)  times daily with a meal.   clopidogrel  75 MG tablet Commonly known as: PLAVIX  Take 1 tablet (75 mg total) by mouth daily.   Entresto  97-103 MG Generic drug: sacubitril -valsartan  Take 1 tablet by mouth 2 (two) times daily.   furosemide  40 MG tablet Commonly known as: LASIX  Take 1 tablet (40 mg total) by mouth 2 (two) times daily.   glipiZIDE  5 MG 24 hr tablet Commonly known as: GLUCOTROL  XL Take 1 tablet (5 mg total) by mouth daily with breakfast.   HumuLIN  70/30 KwikPen (70-30) 100 UNIT/ML KwikPen Generic drug: insulin  isophane & regular human KwikPen Inject 15 Units into the skin 2 (two) times daily.   isosorbide  mononitrate 60 MG 24 hr tablet Commonly known as: IMDUR  Take 1.5 tablets (90 mg total) by mouth daily.   Jardiance  10 MG Tabs tablet Generic drug: empagliflozin  Take 1 tablet (10 mg total) by mouth daily before breakfast.   nitroGLYCERIN  0.4 MG SL tablet Commonly known as: NITROSTAT  Place 1 tablet (0.4 mg total) under the tongue every 5 (five) minutes as needed for chest pain.   spironolactone  25 MG tablet Commonly known as: ALDACTONE  Take 1 tablet (25 mg total) by mouth daily.   TechLite Plus Pen Needles 32G X 4 MM Misc Generic drug: Insulin  Pen Needle USE TO INJECT INSULIN  UP TO 4 TIMES DAILY AS NEEDED   valACYclovir  500 MG tablet Commonly known as: VALTREX  Take 1 tablet (500 mg total) by mouth 2 (two) times daily. What changed:  when to take this reasons to take this         Outstanding Labs/Studies  N/A  Duration of Discharge Encounter: APP Time: 15 minutes   Signed, Ervin Heath, PA 02/09/2024, 10:31 AM

## 2024-02-09 NOTE — Progress Notes (Signed)
  Progress Note  Patient Name: Lauren Delgado Date of Encounter: 02/09/2024 [facility] Cardiologist: Hazle Lites, MD   Interval Summary    No angina. Turned down for CABG Discussed BS control She is comfortable going home and f/u with interventionalist For future plans  Vital Signs Vitals:   02/08/24 1941 02/08/24 2327 02/09/24 0449 02/09/24 0603  BP: 109/64 125/66 136/79   Pulse: 79 83 77   Resp: 19 18 17    Temp: 97.8 F (36.6 C) 98.3 F (36.8 C) 98.2 F (36.8 C)   TempSrc: Oral Oral Oral   SpO2: 100% 98% 96%   Weight:    (!) 143.6 kg  Height:        Intake/Output Summary (Last 24 hours) at 02/09/2024 0928 Last data filed at 02/08/2024 2126 Gross per 24 hour  Intake 900 ml  Output --  Net 900 ml       02/09/2024    6:03 AM 02/08/2024    6:09 AM 02/07/2024    6:44 AM  Last 3 Weights  Weight (lbs) 316 lb 8 oz 318 lb 9.6 oz 319 lb 0.1 oz  Weight (kg) 143.563 kg 144.516 kg 144.7 kg      Telemetry/ECG  Sinus rhythm - Personally Reviewed  Physical Exam  GEN: No acute distress, morbidly obese.   Neck: No JVD Cardiac: RRR, no murmurs, rubs, or gallops.  Respiratory: Clear to auscultation bilaterally. GI: Soft, nontender, non-distended  MS: No edema  Assessment & Plan   Unstable angina  CAD s/p three-vessel PCI with DES to LAD, ramus, RCA at age 38 Hyperlipidemia  Patient presented with chest pain that was increasing in frequency and severity History of extensive CAD requiring DES x 3 at age 38 11/07/2023: HDL 43; LDL Cholesterol 109 02/05/2024: ALT 10  I reviewed her cath. Her stents in RCA and LAD are patent she has occluded mid RCA with fairly good collaterals and distal RCA would be bypassable. LcX occluded with faint collaterals LAD stent patent with 70 % distal LAD and 70% ramus She has had success with stents.  Best option if she has more angina may be to stent ramus and LAD Turned down by Dr Luna Salinas for CABG Consider 2 nd opinion with Dr  Deloise Ferries Given age, weight and poorly controlled DM would be nice to avoid CABG  Hypertension Most recent BP 136/70 mmHg Continue CoReg  37.5 mg BID Continue Entresto  97-103 mg BID Continue Imdur  90 mg daily  Continue spironolactone  25 mg daily  Continue PO Lasix  40 mg BID -- resume post-cath   Diabetes  Most recent A1C 9.9%, 11/2023 Continue Jardiance  10 mg daily Continue SSI while inpatient, intensive scale Willing to try Ozempic  at home and see if diarrhea resumes  Resume home metformin  now 48 hours post cath Carb modified diet A1c 9.7 needs dedicated endocrinologist    D/C home F/U [name] ? [name], MD

## 2024-02-21 ENCOUNTER — Other Ambulatory Visit: Payer: Self-pay

## 2024-02-25 ENCOUNTER — Ambulatory Visit: Attending: Internal Medicine | Admitting: Internal Medicine

## 2024-02-25 ENCOUNTER — Encounter: Payer: Self-pay | Admitting: Internal Medicine

## 2024-02-25 VITALS — BP 130/80 | HR 82 | Ht 62.0 in | Wt 315.5 lb

## 2024-02-25 DIAGNOSIS — E66813 Obesity, class 3: Secondary | ICD-10-CM | POA: Diagnosis not present

## 2024-02-25 DIAGNOSIS — I10 Essential (primary) hypertension: Secondary | ICD-10-CM | POA: Diagnosis not present

## 2024-02-25 DIAGNOSIS — I25118 Atherosclerotic heart disease of native coronary artery with other forms of angina pectoris: Secondary | ICD-10-CM | POA: Insufficient documentation

## 2024-02-25 DIAGNOSIS — E1165 Type 2 diabetes mellitus with hyperglycemia: Secondary | ICD-10-CM | POA: Insufficient documentation

## 2024-02-25 DIAGNOSIS — Z9189 Other specified personal risk factors, not elsewhere classified: Secondary | ICD-10-CM | POA: Diagnosis not present
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
## 2024-02-25 NOTE — Patient Instructions (Addendum)
 Medication Instructions:  Your physician recommends that you continue on your current medications as directed. Please refer to the Current Medication list given to you today.  *If you need a refill on your cardiac medications before your next appointment, please call your pharmacy*  Testing/Procedures: WatchPAT?  Is a FDA cleared portable home sleep study test that uses a watch and 3 points of contact to monitor 7 different channels, including your heart rate, oxygen saturations, body position, snoring, and chest motion.  The study is easy to use from the comfort of your own home and accurately detect sleep apnea.  Before bed, you attach the chest sensor, attached the sleep apnea bracelet to your nondominant hand, and attach the finger probe.  After the study, the raw data is downloaded from the watch and scored for apnea events.   For more information: [email or web address]  Patient Testing Instructions:  Do not put battery into the device until bedtime when you are ready to begin the test. Please call the support number if you need assistance after following the instructions below: 24 hour support line- [phone] or [facility] support at [phone] (option 2)  Download the [facility] WatchPAT One app through the google play store or App Store  Be sure to turn on or enable access to bluetooth in settlings on your smartphone/ device  Make sure no other bluetooth devices are on and within the vicinity of your smartphone/ device and WatchPAT watch during testing.  Make sure to leave your smart phone/ device plugged in and charging all night.  When ready for bed:  Follow the instructions step by step in the WatchPAT One App to activate the testing device. For additional instructions, including video instruction, visit the WatchPAT One video on Youtube. You can search for WatchPat One within Youtube (video is 4 minutes and 18 seconds) or enter:  https://youtube/[email or web address]?[email or web address] Please note: You will be prompted to enter a Pin to connect via bluetooth when starting the test. The PIN will be assigned to you when you receive the test.  The device is disposable, but it recommended that you retain the device until you receive a call letting you know the study has been received and the results have been interpreted.  We will let you know if the study did not transmit to us  properly after the test is completed. You do not need to call us  to confirm the receipt of the test.  Please complete the test within 48 hours of receiving PIN.   Frequently Asked Questions:  What is Watch Bruna one?  A single use fully disposable home sleep apnea testing device and will not need to be returned after completion.  What are the requirements to use WatchPAT one?  The be able to have a successful watchpat one sleep study, you should have your Watch pat one device, your smart phone, watch pat one app, your PIN number and Internet access What type of phone do I need?  You should have a smart phone that uses Android 5.1 and above or any Iphone with IOS 10 and above How can I download the WatchPAT one app?  Based on your device type search for WatchPAT one app either in google play for android devices or APP store for Iphone's Where will I get my PIN for the study?  Your PIN will be provided by your physician's office. It is used for authentication and if you lose/forget your PIN, please reach out to your providers office.  I do not have Internet  at home. Can I do WatchPAT one study?  WatchPAT One needs Internet connection throughout the night to be able to transmit the sleep data. You can use your home/local internet or your cellular's data package. However, it is always recommended to use home/local Internet. It is estimated that between 20MB-30MB will be used with each study.However, the application will be looking for space in the phone to start the study.   What happens if I lose internet or bluetooth connection?  During the internet disconnection, your phone will not be able to transmit the sleep data. All the data, will be stored in your phone. As soon as the internet connection is back on, the phone will being sending the sleep data. During the bluetooth disconnection, WatchPAT one will not be able to to send the sleep data to your phone. Data will be kept in the WatchPAT one until two devices have bluetooth connection back on. As soon as the connection is back on, WatchPAT one will send the sleep data to the phone.  How long do I need to wear the WatchPAT one?  After you start the study, you should wear the device at least 6 hours.  How far should I keep my phone from the device?  During the night, your phone should be within 15 feet.  What happens if I leave the room for restroom or other reasons?  Leaving the room for any reason will not cause any problem. As soon as your get back to the room, both devices will reconnect and will continue to send the sleep data. Can I use my phone during the sleep study?  Yes, you can use your phone as usual during the study. But it is recommended to put your watchpat one on when you are ready to go to bed.  How will I get my study results?  A soon as you completed your study, your sleep data will be sent to the provider. They will then share the results with you when they are ready.   Follow-Up: At [facility], you and your health needs are our priority.  As part of our continuing mission to provide you with exceptional heart care, our providers are all part of one team.  This team includes your primary Cardiologist (physician) and Advanced Practice Providers or APPs (Physician Assistants and Nurse Practitioners) who all work together to provide you with the care you need, when you need it.  Your next appointment:   Keep follow up appointments   Other Instructions Referral to Endocrinology-  pending scheduling

## 2024-02-28 ENCOUNTER — Ambulatory Visit: Attending: Cardiology | Admitting: Pharmacist

## 2024-02-28 DIAGNOSIS — E785 Hyperlipidemia, unspecified: Secondary | ICD-10-CM | POA: Insufficient documentation

## 2024-02-28 DIAGNOSIS — E1169 Type 2 diabetes mellitus with other specified complication: Secondary | ICD-10-CM | POA: Diagnosis not present

## 2024-02-28 NOTE — Assessment & Plan Note (Signed)
 Assessment: LDL-C not at goal of <55 On atorvastatin  80mg  daily Did not tolerate zetia   We discussed PCSK9i, its role in decreasing CV risk, injection technique, side effects, dose and LDL-C reduction. She reports exercising 3-4 times per week Aware that she needs to work with PCP for better BG control Discussed sugar in drinks, avoiding juice  Plan: Submit PA for Repatha Continue atorvastatin  80mg  daily Labs in 3 months
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
## 2024-02-28 NOTE — Patient Instructions (Addendum)
 I will submit a prior authorization for Repatha. I will call you once I hear back. Please call me at [phone] with any questions.   Repatha is a cholesterol medication that improved your body's ability to get rid of bad cholesterol known as LDL. It can lower your LDL up to 60%! It is an injection that is given under the skin every 2 weeks. The medication often requires a prior authorization from your insurance company. We will take care of submitting all the necessary information to your insurance company to get it approved. The most common side effects of Repatha include runny nose, symptoms of the common cold, rarely flu or flu-like symptoms, back/muscle pain in about 3-4% of the patients, and redness, pain, or bruising at the injection site. Tell your healthcare provider if you have any side effect that bothers you or that does not go away.   Hyperlipidemia Foods high in saturated fat tend to increase LDL (bad) cholesterol the most.  Not all fat is bad fat! Foods higher in unsaturated fat are healthy, like fish, nuts, and avocadoes. Overall, following a diet like the Mediterranean diet can help to improve your cholesterol. Hypertriglyceridemia Foods high in carbohydrates and sugar, as well as alcohol, can increase your triglycerides. If you are diabetic, poorly controlled blood sugar can also increase your triglycerides. A non-fasting state can affect the triglyceride level in your lab work. Please make sure you are fasting to improve accuracy of this lab test.  Eat more of these Eat less of these  Carbohydrates Fiber-rich whole grains: oats, whole wheat pasta or bread, quinoa, barley, oats and brown rice Aim for  of your plate to be whole grains Men: aim for > 38 grams of fiber per day Women: aim for > 25 grams of fiber per day Refined grains: white bread, rice, or pasta, macaroni and cheese Foods with added sugar Processed foods: desserts like cake, cookies, donuts, muffins, and  pastries; microwave meals, chips, [place] fries  Fruits and vegetables A variety of bright colored fruits and vegetables: spinach, broccoli, tomatoes, carrots, berries,  oranges, apples, bananas, berries, and melon Aim for  of your plate to be fruits/vegetables Canned vegetables Starchy vegetables like potatoes Canned fruit in heavy syrup  Protein Lean meat: skinless chicken or [place] Fish: salmon, trout, tuna, cod, tilapia, flounder, etc Legumes: beans, lentils, chickpeas, tofu, nuts Aim for  of your plate to be protein Red, fatty, or fried meat Processed foods: deli meat, hot dogs, burgers, pizza, fast food   Dairy, fats and oils Unsaturated fats: fish, nuts, and avocadoes  Low fat or fat free milk or yogurt Olive and canola oil Saturated fats: butter, lard, cream, coconut oil Whole milk and other full fat dairy products like cheese Sugar-sweetened dairy products (many yogurts have added sugar)  Drinks Water: plain or sparkling Sugar free or diet drinks Unsweet tea or coffee Keep added sugar intake to  < 6 teaspoons (24 grams) Regular soda Fruit juice Alcohol  Other ways to adopt a healthy lifestyle:  Exercise:  Exercise: Aim for 150 min of moderate intensity exercise weekly for heart health, and weights twice weekly for bone health. Stay active - any steps are better than no steps!  Sleep: Aim for 7-9 hours of sleep nightly.  Weight: Know what a healthy weight is for you (roughly BMI <25) and aim to maintain this. Unfortunately, this is not the most accurate measure of healthy weight, but it is the simplest measurement to use. A more accurate  measurement involves body scanning which measures lean muscle, fat tissue and bony density. We do not have this equipment at [facility].

## 2024-03-02 ENCOUNTER — Telehealth: Payer: Self-pay

## 2024-03-02 ENCOUNTER — Ambulatory Visit: Admitting: Emergency Medicine

## 2024-03-02 ENCOUNTER — Other Ambulatory Visit (HOSPITAL_COMMUNITY): Payer: Self-pay

## 2024-03-02 NOTE — Telephone Encounter (Signed)
-----   Message from Lauren Delgado sent at 02/28/2024  2:59 PM EDT ----- Please do PA for Repatha- ASCVD on atorvastatin  80mg . Intolerant to zetia

## 2024-03-02 NOTE — Telephone Encounter (Signed)
 Pharmacy Patient Advocate Encounter   Received notification from Physician's Office that prior authorization for REPATHA is required/requested.   Insurance verification completed.   The patient is insured through [facility] MEDICAID .   Per test claim: PA required; PA submitted to above mentioned insurance via CoverMyMeds Key/confirmation #/EOC [facility] Status is pending

## 2024-03-03 NOTE — Progress Notes (Deleted)
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 widely patent LAD and RCA stents, focal stenosis in the ramus branch as well as the mid LAD PCI was deferred, she was referred to CT surgery who felt she was a poor candidate for surgery in the setting of morbid obesity, uncontrolled diabetes.  Medications were adjusted, she was discharged home.  She was last seen in the office on 02/25/2024 and noted rare intermittent chest discomfort.  She was started on Ozempic .  It was noted that should she have progressive symptoms concerning for angina, PCI could be considered.  It was recommended she start Repatha.She presents today for follow-up. Since her last visit she  1. CAD: CAD s/p NSTEMI, DESx 4 (p-mLAD, Ramus, pRCA) in 2019. Myoview  in January 2021 showed EF 36%, intermediate risk study with apical scar and moderate anterior apical  ischemia with this scarring of anterior septum. Medical management was advised.  She was hospitalized in March 2025 in the setting of NSTEMI. Echo showed EF 45%, mildly decreased LV function, G1 DD, elevated left atrial pressure,  normal RV systolic function, mild mitral valve regurgitation, no significant change from prior echo Ischemic evaluation was deferred.  She continues to note symptoms of intermittent chest discomfort both at rest and with activity.  She describes her symptoms as indigestion,similar to prior anginal equivalent.  She has not had relief with antacids, she did take nitroglycerin  with some relief of her symptoms.  Through shared decision making, will pursue cardiac PET stress test (not a treadmill candidate).  Will increase Imdur  to 90 mg daily.   Reviewed ED precautions.  Continue aspirin , Plavix , carvedilol , Entresto , spironolactone , Jardiance , Lasix , Imdur  as above, and Lipitor .  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 Most recent echo in 11/2023 showed EF 45%, mildly decreased LV function, G1 DD, elevated left atrial pressure, normal RV systolic function, mild mitral valve regurgitation, no significant change from prior echo.  Euvolemic and well compensated on exam. Will update CBC, BMET today given recent medication changes. Will plan to schedule repeat echocardiogram at next follow-up visit pending cardiac PET stress test results. Continue carvedilol , Entresto , spironolactone , Jardiance , and Lasix .   3. Lightheadedness/dizziness: Cardiac monitor was unremarkable. Most recent echo as above. CT of the neck in January 2023 showed less than 50% stenosis of R ICA, not considered hemodynamically significant.  Dizziness has been an ongoing issue, however, her symptoms have recently improved.  She denies any palpitations, presyncope or syncope.  Continue to monitor.    4. Hypertension: BP well controlled. Continue current antihypertensive regimen.   5. Hyperlipidemia: LDL was 109 in 11/2023, above goal.  She is no longer taking Zetia  (she reported swelling in her mouth and throat), follow-up with lipid clinic Pharm.D. as scheduled.  Continue  Lipitor .  6. H/o CVA: Hospitalized 08/2021 in the setting of CVA. Bubble study was negative for PFO. TEE without cardiac source of embolism.  30-day event monitor showed normal sinus rhythm, no signs of atrial fibrillation or other arrhythmia. Following with neurology.  Continue Plavix , Lipitor .  7. Type 2 diabetes/obesity: A1c was 9.9 in 11/2023. Monitored and managed per PCP.    8. Disposition: Follow-up in  Home Medications    Current Outpatient Medications  Medication Sig Dispense Refill   aspirin  EC 81 MG tablet Take 81 mg by mouth daily. Swallow whole.     atorvastatin  (LIPITOR ) 80 MG tablet Take 1 tablet (80 mg total) by mouth daily. 90 tablet 3   carvedilol  (COREG ) 12.5 MG tablet Take 3 tablets (37.5 mg total) by mouth 2 (two) times daily with a meal. 180 tablet 3   clopidogrel  (PLAVIX ) 75 MG tablet Take 1 tablet (75 mg total) by mouth daily. 90 tablet 3   empagliflozin  (JARDIANCE ) 10 MG TABS tablet Take 1 tablet (10 mg total) by mouth daily before breakfast. 90 tablet 3   furosemide  (LASIX ) 40 MG tablet Take 1 tablet (40 mg total) by mouth 2 (two) times daily. 180 tablet 3   glipiZIDE  (GLUCOTROL  XL) 5 MG 24 hr tablet Take 1 tablet (5 mg total) by mouth daily with breakfast. 90 tablet 1   insulin  isophane & regular human KwikPen (HUMULIN  70/30 KWIKPEN) (70-30) 100 UNIT/ML KwikPen Inject 15 Units into the skin 2 (two) times daily. 3 mL 5   Insulin  Pen Needle (  BD PEN NEEDLE NANO 2ND GEN) 32G X 4 MM MISC USE TO INJECT INSULIN  UP TO 4 TIMES DAILY AS NEEDED 100 each 0   isosorbide  mononitrate (IMDUR ) 60 MG 24 hr tablet Take 1.5 tablets (90 mg total) by mouth daily. 45 tablet 3   nitroGLYCERIN  (NITROSTAT ) 0.4 MG SL tablet Place 1 tablet (0.4 mg total) under the tongue every 5 (five) minutes as needed for chest pain. 75 tablet 1   sacubitril -valsartan  (ENTRESTO ) 97-103 MG Take 1 tablet by mouth 2 (two) times daily. 180 tablet 3   spironolactone  (ALDACTONE ) 25 MG tablet Take 1 tablet (25 mg  total) by mouth daily. 90 tablet 3   valACYclovir  (VALTREX ) 500 MG tablet Take 1 tablet (500 mg total) by mouth 2 (two) times daily. (Patient taking differently: Take 500 mg by mouth as needed.) 180 tablet 1   No current facility-administered medications for this visit.     Review of Systems    ***.  All other systems reviewed and are otherwise negative except as noted above.    Physical Exam    VS:  LMP 10/15/2023 (Approximate)  , BMI There is no height or weight on file to calculate BMI.     GEN: Well nourished, well developed, in no acute distress. HEENT: normal. Neck: Supple, no JVD, carotid bruits, or masses. Cardiac: RRR, no murmurs, rubs, or gallops. No clubbing, cyanosis, edema.  Radials/DP/PT 2+ and equal bilaterally.  Respiratory:  Respirations regular and unlabored, clear to auscultation bilaterally. GI: Soft, nontender, nondistended, BS + x 4. MS: no deformity or atrophy. Skin: warm and dry, no rash. Neuro:  Strength and sensation are intact. Psych: Normal affect.  Accessory Clinical Findings    ECG personally reviewed by me today -    - no acute changes.   Lab Results  Component Value Date   WBC 8.1 02/09/2024   HGB 10.1 (L) 02/09/2024   HCT 30.7 (L) 02/09/2024   MCV 90.8 02/09/2024   PLT 277 02/09/2024   Lab Results  Component Value Date   CREATININE 1.60 (H) 02/09/2024   BUN 33 (H) 02/09/2024   NA 137 02/09/2024   K 3.6 02/09/2024   CL 102 02/09/2024   CO2 26 02/09/2024   Lab Results  Component Value Date   ALT 10 02/05/2024   AST 17 02/05/2024   ALKPHOS 119 02/05/2024   BILITOT 0.2 02/05/2024   Lab Results  Component Value Date   CHOL 177 11/07/2023   HDL 43 11/07/2023   LDLCALC 109 (H) 11/07/2023   TRIG 125 11/07/2023   CHOLHDL 4.1 11/07/2023    Lab Results  Component Value Date   HGBA1C 9.7 (H) 02/06/2024    Assessment & Plan    1.  ***  No BP recorded.  {Refresh Note OR Click here to enter BP  :1}***   [name] JAYSON Braver,  NP 03/03/2024, 1:39 PM

## 2024-03-04 ENCOUNTER — Ambulatory Visit: Admitting: Nurse Practitioner

## 2024-03-04 DIAGNOSIS — I5042 Chronic combined systolic (congestive) and diastolic (congestive) heart failure: Secondary | ICD-10-CM

## 2024-03-04 DIAGNOSIS — I10 Essential (primary) hypertension: Secondary | ICD-10-CM

## 2024-03-04 DIAGNOSIS — E785 Hyperlipidemia, unspecified: Secondary | ICD-10-CM

## 2024-03-04 DIAGNOSIS — Z8673 Personal history of transient ischemic attack (TIA), and cerebral infarction without residual deficits: Secondary | ICD-10-CM

## 2024-03-04 DIAGNOSIS — R42 Dizziness and giddiness: Secondary | ICD-10-CM

## 2024-03-04 DIAGNOSIS — I2510 Atherosclerotic heart disease of native coronary artery without angina pectoris: Secondary | ICD-10-CM

## 2024-03-04 DIAGNOSIS — E1165 Type 2 diabetes mellitus with hyperglycemia: Secondary | ICD-10-CM

## 2024-03-05 NOTE — Telephone Encounter (Signed)
 Pharmacy Patient Advocate Encounter  Received notification from [facility] [place] [employer] that Prior Authorization for REPATHA has been DENIED.  Full denial letter will be uploaded to the media tab. See denial reason below.

## 2024-03-09 ENCOUNTER — Other Ambulatory Visit: Payer: Self-pay

## 2024-03-09 ENCOUNTER — Other Ambulatory Visit (HOSPITAL_COMMUNITY): Payer: Self-pay

## 2024-03-09 ENCOUNTER — Telehealth: Payer: Self-pay

## 2024-03-09 DIAGNOSIS — E119 Type 2 diabetes mellitus without complications: Secondary | ICD-10-CM

## 2024-03-09 DIAGNOSIS — E785 Hyperlipidemia, unspecified: Secondary | ICD-10-CM

## 2024-03-09 MED ORDER — REPATHA SURECLICK 140 MG/ML ~~LOC~~ SOAJ
1.0000 mL | SUBCUTANEOUS | 3 refills | Status: AC
  Filled 2024-03-09: qty 2, 28d supply, fill #0
  Filled 2024-03-19 – 2024-03-30 (×2): qty 2, 28d supply, fill #1
  Filled 2024-04-23: qty 2, 28d supply, fill #2
  Filled 2024-05-14: qty 2, 28d supply, fill #3
  Filled 2024-06-16: qty 2, 28d supply, fill #4
  Filled 2024-07-11: qty 2, 28d supply, fill #5
  Filled 2024-08-14: qty 2, 28d supply, fill #6
  Filled 2024-09-15: qty 2, 28d supply, fill #7

## 2024-03-09 NOTE — Telephone Encounter (Signed)
 Yes ma'am. Labs from 11/07/23 were attached to request. I can call plan and get more clarification on the denial but I'm thinking it's more so denied for zetia . They may need more documentation/details regarding the allergy/intolerance to zetia . I can clarify with plan and let you know.

## 2024-03-09 NOTE — Telephone Encounter (Signed)
 Pharmacy Patient Advocate Encounter  Received notification from [employer] that Prior Authorization for REPATHA  has been APPROVED from 03/09/24 to 03/09/25. Ran test claim, Copay is $4. This test claim was processed through [facility]- copay amounts may vary at other pharmacies due to pharmacy/plan contracts, or as the patient moves through the different stages of their insurance plan.

## 2024-03-09 NOTE — Telephone Encounter (Signed)
 Patient made aware of approval. Rx sent to [facility]. Labs in 3 months

## 2024-03-09 NOTE — Addendum Note (Signed)
 Addended by: Debby Clyne D on: 03/09/2024 03:27 PM   Modules accepted: Orders

## 2024-03-09 NOTE — Telephone Encounter (Signed)
 I got it approved in a peer to peer, see separate encounter. She's good to go now!

## 2024-03-09 NOTE — Telephone Encounter (Addendum)
 Plan denied PA for Repatha  reason given was due to lab test after treatment showing inadequate control on statin and zetia  combination. Called plan to get clarification on denial since labs from 11/07/23 were attached with PA request. Thinking denial is related to needing more notes/details regarding zetia  intolerance. Called plan to get clarification on denial.     Per plan zetia  claim paid last in April 2025 and labs were in March. They need post zetia  labs or appeal with details explaining why patient cannot take zetia . May be able to resolve through peer to peer.   [facility] suggested a peer to peer to get resolved. Notes and case created and submitted to plan. Waiting on callback.

## 2024-03-12 ENCOUNTER — Ambulatory Visit: Admitting: Family

## 2024-03-13 ENCOUNTER — Other Ambulatory Visit: Payer: Self-pay

## 2024-03-16 ENCOUNTER — Ambulatory Visit (INDEPENDENT_AMBULATORY_CARE_PROVIDER_SITE_OTHER): Admitting: Emergency Medicine

## 2024-03-16 ENCOUNTER — Encounter: Payer: Self-pay | Admitting: Emergency Medicine

## 2024-03-16 VITALS — BP 152/88 | HR 88 | Temp 98.2°F | Ht 63.0 in | Wt 324.0 lb

## 2024-03-16 DIAGNOSIS — E785 Hyperlipidemia, unspecified: Secondary | ICD-10-CM | POA: Diagnosis not present

## 2024-03-16 DIAGNOSIS — I5042 Chronic combined systolic (congestive) and diastolic (congestive) heart failure: Secondary | ICD-10-CM

## 2024-03-16 DIAGNOSIS — E1169 Type 2 diabetes mellitus with other specified complication: Secondary | ICD-10-CM | POA: Diagnosis not present

## 2024-03-16 DIAGNOSIS — Z794 Long term (current) use of insulin: Secondary | ICD-10-CM | POA: Diagnosis not present

## 2024-03-16 DIAGNOSIS — I25118 Atherosclerotic heart disease of native coronary artery with other forms of angina pectoris: Secondary | ICD-10-CM | POA: Diagnosis not present

## 2024-03-16 DIAGNOSIS — I152 Hypertension secondary to endocrine disorders: Secondary | ICD-10-CM

## 2024-03-16 DIAGNOSIS — I429 Cardiomyopathy, unspecified: Secondary | ICD-10-CM

## 2024-03-16 DIAGNOSIS — N1832 Chronic kidney disease, stage 3b: Secondary | ICD-10-CM | POA: Diagnosis not present

## 2024-03-16 DIAGNOSIS — Z7984 Long term (current) use of oral hypoglycemic drugs: Secondary | ICD-10-CM

## 2024-03-16 DIAGNOSIS — E1159 Type 2 diabetes mellitus with other circulatory complications: Secondary | ICD-10-CM | POA: Diagnosis not present

## 2024-03-16 NOTE — Assessment & Plan Note (Signed)
Stable.  No clinical findings of acute congestive heart failure

## 2024-03-16 NOTE — Assessment & Plan Note (Signed)
 No recent anginal episodes Continues isosorbide  mononitrate 90 mg daily along with Plavix  75 mg daily and carvedilol  37.5 mg twice a day

## 2024-03-16 NOTE — Assessment & Plan Note (Signed)
 Advised to stay well-hydrated and avoid NSAIDs Continue Jardiance  10 mg daily.  Higher doses give her severe lightheadedness
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
## 2024-03-16 NOTE — Assessment & Plan Note (Signed)
 Clinically stable.  Normovolemic. Most recent cardiologist office visit notes reviewed Cardiologist recently increased Entresto  to 97-103 twice a day, Lasix  40 mg daily, Jardiance  10 mg daily, Aldactone  25 mg daily Also on carvedilol  37.5 mg twice a day

## 2024-03-16 NOTE — Assessment & Plan Note (Signed)
 Well-controlled hypertension Continue carvedilol  37.5 mg twice a day, Entresto  97-103 twice a day, and Aldactone  25 mg daily.  Amlodipine  was recently discontinued Uncontrolled diabetes with hemoglobin A1c of 9.7 Ozempic  and Trulicity  gave her diarrhea Not tolerating Mounjaro  either Presently on NovoLog  insulin  70/30 15 units twice a day, Jardiance  10 mg daily and glipizide  5 mg daily Recommend to increase NovoLog  to 20 units twice a day Recommend endocrinology evaluation.  Referral placed today Cardiovascular risks associated with uncontrolled diabetes discussed Diet and nutrition discussed Follow-up in 3 months

## 2024-03-16 NOTE — Patient Instructions (Signed)

## 2024-03-19 ENCOUNTER — Other Ambulatory Visit: Payer: Self-pay

## 2024-03-19 ENCOUNTER — Other Ambulatory Visit: Payer: Self-pay | Admitting: Emergency Medicine

## 2024-03-19 ENCOUNTER — Other Ambulatory Visit (HOSPITAL_COMMUNITY): Payer: Self-pay

## 2024-03-19 DIAGNOSIS — I152 Hypertension secondary to endocrine disorders: Secondary | ICD-10-CM

## 2024-03-19 MED ORDER — GLIPIZIDE ER 5 MG PO TB24
5.0000 mg | ORAL_TABLET | Freq: Every day | ORAL | 1 refills | Status: AC
  Filled 2024-03-19 – 2024-05-14 (×2): qty 90, 90d supply, fill #0
  Filled 2024-06-16 – 2024-09-15 (×2): qty 90, 90d supply, fill #1

## 2024-03-19 MED ORDER — HUMULIN 70/30 KWIKPEN (70-30) 100 UNIT/ML ~~LOC~~ SUPN
15.0000 [IU] | PEN_INJECTOR | Freq: Two times a day (BID) | SUBCUTANEOUS | 5 refills | Status: DC
  Filled 2024-03-19 – 2024-03-26 (×2): qty 3, 10d supply, fill #0
  Filled 2024-05-14: qty 3, 10d supply, fill #1
  Filled 2024-06-16: qty 3, 10d supply, fill #2
  Filled 2024-07-11: qty 3, 10d supply, fill #3
  Filled 2024-08-14: qty 3, 10d supply, fill #4
  Filled 2024-08-23: qty 3, 10d supply, fill #5

## 2024-03-24 ENCOUNTER — Encounter: Payer: Self-pay | Admitting: Emergency Medicine

## 2024-03-24 ENCOUNTER — Ambulatory Visit: Admitting: Nurse Practitioner

## 2024-03-24 NOTE — Progress Notes (Deleted)
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 multivessel CAD, widely patent LAD and RCA stents, focal stenosis in the ramus branch as well as the mid LAD PCI was deferred, she was referred to CT surgery who felt she was a poor candidate for surgery in the setting of morbid obesity, uncontrolled diabetes.  Medications were adjusted, she was discharged home.  She was last seen in the office on 02/25/2024 and noted rare intermittent chest discomfort.  She was started on Ozempic  and Repatha .  It was noted that should she have progressive symptoms concerning for angina, PCI could be considered.   She presents today for follow-up. Since her last visit she  1. CAD: CAD s/p NSTEMI, DESx 4 (p-mLAD, Ramus, pRCA) in 2019. Myoview  in January 2021 showed EF 36%, intermediate risk study with apical scar and moderate anterior apical ischemia  with this scarring of anterior septum. Medical management was advised.  She was hospitalized in March 2025 in the setting of NSTEMI. Echo showed EF 45%, mildly decreased LV function, G1 DD, elevated left atrial pressure, normal RV systolic  function, mild mitral valve regurgitation, no significant change from prior echo Ischemic evaluation was deferred.  She continues to note symptoms of intermittent chest discomfort both at rest and with activity.  She describes her symptoms as indigestion,similar to prior anginal equivalent.  She has not had relief with antacids, she did take nitroglycerin  with some relief of her symptoms.  Through shared decision making, will pursue cardiac PET stress test (not a treadmill candidate).  Will increase Imdur  to 90 mg daily.   Reviewed ED precautions.  Continue aspirin , Plavix , carvedilol , Entresto , spironolactone , Jardiance , Lasix , Imdur  as above, and Lipitor .  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 Most recent echo in 11/2023 showed EF 45%, mildly decreased LV function, G1 DD, elevated left atrial pressure, normal RV systolic function, mild mitral valve regurgitation, no significant change from prior echo.  Euvolemic and well compensated on exam. Will update CBC, BMET today given recent medication changes. Will plan to schedule repeat echocardiogram at next follow-up visit pending cardiac PET stress test results. Continue carvedilol , Entresto , spironolactone , Jardiance , and Lasix .   3. Lightheadedness/dizziness: Cardiac monitor was unremarkable. Most recent echo as above. CT of the neck in January 2023 showed less than 50% stenosis of R ICA, not considered hemodynamically significant.  Dizziness has been an ongoing issue, however, her symptoms have recently improved.  She denies any palpitations, presyncope or syncope.  Continue to monitor.    4. Hypertension: BP well controlled. Continue current antihypertensive regimen.   5. Hyperlipidemia: LDL was 109 in 11/2023, above goal.  She is no longer taking Zetia  (she reported swelling in her mouth and throat), follow-up with lipid clinic Pharm.D. as scheduled.  Continue Lipitor .  6. H/o CVA:  Hospitalized 08/2021 in the setting of CVA. Bubble study was negative for PFO. TEE without cardiac source of embolism.  30-day event monitor showed normal sinus rhythm, no signs of atrial fibrillation or other arrhythmia. Following with neurology.  Continue Plavix , Lipitor .  7. Type 2 diabetes/obesity: A1c was 9.9 in 11/2023. Monitored and managed per PCP.    8. Disposition: Follow-up in  Home Medications    Current Outpatient Medications  Medication Sig Dispense Refill   aspirin  EC 81 MG tablet Take 81 mg by mouth daily. Swallow whole.     atorvastatin  (LIPITOR ) 80 MG tablet Take 1 tablet (80 mg total) by mouth daily. 90 tablet 3   carvedilol  (COREG ) 12.5 MG tablet Take 3 tablets (37.5 mg total) by mouth 2 (two) times daily with a meal. 180 tablet 3   clopidogrel  (PLAVIX ) 75 MG tablet Take 1 tablet (75 mg total) by mouth daily. 90 tablet 3   empagliflozin  (JARDIANCE ) 10 MG TABS tablet Take 1 tablet (10 mg total) by mouth daily before breakfast. 90 tablet 3   Evolocumab  (REPATHA  SURECLICK) 140 MG/ML SOAJ Inject 140 mg into the skin every 14 (fourteen) days. 6 mL 3   furosemide  (LASIX ) 40 MG tablet Take 1 tablet (40 mg total) by mouth 2 (two) times daily. 180 tablet 3   glipiZIDE  (GLUCOTROL  XL) 5 MG 24 hr tablet Take 1 tablet (5 mg total) by mouth daily with breakfast. 90 tablet 1   insulin  isophane & regular human KwikPen (HUMULIN  70/30 KWIKPEN) (70-30) 100  UNIT/ML KwikPen Inject 15 Units into the skin 2 (two) times daily. 3 mL 5   Insulin  Pen Needle (BD PEN NEEDLE NANO 2ND GEN) 32G X 4 MM MISC USE TO INJECT INSULIN  UP TO 4 TIMES DAILY AS NEEDED 100 each 0   isosorbide  mononitrate (IMDUR ) 60 MG 24 hr tablet Take 1.5 tablets (90 mg total) by mouth daily. 45 tablet 3   nitroGLYCERIN  (NITROSTAT ) 0.4 MG SL tablet Place 1 tablet (0.4 mg total) under the tongue every 5 (five) minutes as needed for chest pain. 75 tablet 1   sacubitril -valsartan  (ENTRESTO ) 97-103 MG Take 1 tablet by mouth 2 (two) times  daily. 180 tablet 3   spironolactone  (ALDACTONE ) 25 MG tablet Take 1 tablet (25 mg total) by mouth daily. 90 tablet 3   valACYclovir  (VALTREX ) 500 MG tablet Take 1 tablet (500 mg total) by mouth 2 (two) times daily. (Patient taking differently: Take 500 mg by mouth as needed.) 180 tablet 1   No current facility-administered medications for this visit.     Review of Systems    ***.  All other systems reviewed and are otherwise negative except as noted above.    Physical Exam    VS:  There were no vitals taken for this visit. , BMI There is no height or weight on file to calculate BMI.     GEN: Well nourished, well developed, in no acute distress. HEENT: normal. Neck: Supple, no JVD, carotid bruits, or masses. Cardiac: RRR, no murmurs, rubs, or gallops. No clubbing, cyanosis, edema.  Radials/DP/PT 2+ and equal bilaterally.  Respiratory:  Respirations regular and unlabored, clear to auscultation bilaterally. GI: Soft, nontender, nondistended, BS + x 4. MS: no deformity or atrophy. Skin: warm and dry, no rash. Neuro:  Strength and sensation are intact. Psych: Normal affect.  Accessory Clinical Findings    ECG personally reviewed by me today -    - no acute changes.   Lab Results  Component Value Date   WBC 8.1 02/09/2024   HGB 10.1 (L) 02/09/2024   HCT 30.7 (L) 02/09/2024   MCV 90.8 02/09/2024   PLT 277 02/09/2024   Lab Results  Component Value Date   CREATININE 1.60 (H) 02/09/2024   BUN 33 (H) 02/09/2024   NA 137 02/09/2024   K 3.6 02/09/2024   CL 102 02/09/2024   CO2 26 02/09/2024   Lab Results  Component Value Date   ALT 10 02/05/2024   AST 17 02/05/2024   ALKPHOS 119 02/05/2024   BILITOT 0.2 02/05/2024   Lab Results  Component Value Date   CHOL 177 11/07/2023   HDL 43 11/07/2023   LDLCALC 109 (H) 11/07/2023   TRIG 125 11/07/2023   CHOLHDL 4.1 11/07/2023    Lab Results  Component Value Date   HGBA1C 9.7 (H) 02/06/2024    Assessment & Plan    1.   ***  No BP recorded.  {Refresh Note OR Click here to enter BP  :1}***   [name] JAYSON Braver, NP 03/24/2024, 6:12 AM

## 2024-03-25 NOTE — Telephone Encounter (Signed)
 Yes

## 2024-03-26 ENCOUNTER — Other Ambulatory Visit (HOSPITAL_COMMUNITY): Payer: Self-pay

## 2024-03-26 ENCOUNTER — Other Ambulatory Visit: Payer: Self-pay

## 2024-03-26 ENCOUNTER — Telehealth (HOSPITAL_COMMUNITY): Payer: Self-pay | Admitting: Pharmacy Technician

## 2024-03-26 ENCOUNTER — Other Ambulatory Visit: Payer: Self-pay | Admitting: Radiology

## 2024-03-26 ENCOUNTER — Encounter (HOSPITAL_COMMUNITY): Payer: Self-pay

## 2024-03-26 DIAGNOSIS — E1165 Type 2 diabetes mellitus with hyperglycemia: Secondary | ICD-10-CM

## 2024-03-26 MED ORDER — OZEMPIC (0.25 OR 0.5 MG/DOSE) 2 MG/3ML ~~LOC~~ SOPN
0.2500 mg | PEN_INJECTOR | SUBCUTANEOUS | 1 refills | Status: DC
  Filled 2024-03-26: qty 3, 56d supply, fill #0
  Filled 2024-03-27 (×2): qty 3, 28d supply, fill #0
  Filled 2024-04-23: qty 3, 28d supply, fill #1

## 2024-03-27 ENCOUNTER — Other Ambulatory Visit (HOSPITAL_COMMUNITY): Payer: Self-pay

## 2024-03-27 ENCOUNTER — Telehealth (HOSPITAL_COMMUNITY): Payer: Self-pay

## 2024-03-27 ENCOUNTER — Other Ambulatory Visit: Payer: Self-pay

## 2024-03-27 NOTE — Telephone Encounter (Signed)
 PA request has been Received. New Encounter has been or will be created for follow up. For additional info see Pharmacy Prior Auth telephone encounter from 03/27/24.

## 2024-03-27 NOTE — Telephone Encounter (Signed)
 Pharmacy Patient Advocate Encounter  Received notification from [facility] MEDICAID that Prior Authorization for Ozempic  (0.25 or 0.5 MG/DOSE) 2MG /3ML pen-injectors  has been APPROVED from 03/27/24 to 03/27/25. Ran test claim, Copay is $4. This test claim was processed through [facility]- copay amounts may vary at other pharmacies due to pharmacy/plan contracts, or as the patient moves through the different stages of their insurance plan.   PA #/Case ID/Reference #: [id number]

## 2024-03-27 NOTE — Telephone Encounter (Signed)
 Pharmacy Patient Advocate Encounter   Received notification from Pt Calls Messages that prior authorization for Ozempic  (0.25 or 0.5 MG/DOSE) 2MG /3ML pen-injectors  is required/requested.   Insurance verification completed.   The patient is insured through [facility] MEDICAID .   Per test claim: PA required; PA submitted to above mentioned insurance via CoverMyMeds Key/confirmation #/EOC [id number] Status is pending

## 2024-03-30 ENCOUNTER — Other Ambulatory Visit (HOSPITAL_COMMUNITY): Payer: Self-pay

## 2024-04-14 LAB — HM DIABETES EYE EXAM

## 2024-04-15 DIAGNOSIS — R079 Chest pain, unspecified: Secondary | ICD-10-CM

## 2024-04-21 ENCOUNTER — Other Ambulatory Visit (HOSPITAL_COMMUNITY)

## 2024-04-23 ENCOUNTER — Other Ambulatory Visit (HOSPITAL_COMMUNITY): Payer: Self-pay

## 2024-05-06 NOTE — Progress Notes (Unsigned)
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  She was started on Ozempic  and Repatha .  It was noted that should she have progressive symptoms concerning for angina, PCI could be considered.    She presents today for follow-up accompanied by her son. Since her last visit she continues to note daily chest pain both at rest and with activity, dyspnea on exertion.  She stopped taking her Jardiance  in the setting of dizziness, she does not wish to resume Jardiance  at this time.  She is concerned about her ongoing symptoms and is interested in discussing possible PCI.  Home Medications    Current Outpatient Medications  Medication Sig Dispense Refill   aspirin  EC 81 MG tablet Take 81 mg by mouth daily. Swallow whole.  atorvastatin  (LIPITOR ) 80 MG tablet Take 1 tablet (80 mg total) by mouth daily. 90 tablet 3   carvedilol  (COREG ) 12.5 MG tablet Take 3 tablets (37.5 mg total) by mouth 2 (two) times daily with a meal. 180 tablet 3   clopidogrel  (PLAVIX ) 75 MG tablet Take 1 tablet (75 mg total) by mouth daily. 90 tablet 3   Evolocumab  (REPATHA   SURECLICK) 140 MG/ML SOAJ Inject 140 mg into the skin every 14 (fourteen) days. 6 mL 3   furosemide  (LASIX ) 40 MG tablet Take 1 tablet (40 mg total) by mouth 2 (two) times daily. 180 tablet 3   glipiZIDE  (GLUCOTROL  XL) 5 MG 24 hr tablet Take 1 tablet (5 mg total) by mouth daily with breakfast. 90 tablet 1   insulin  isophane & regular human KwikPen (HUMULIN  70/30 KWIKPEN) (70-30) 100 UNIT/ML KwikPen Inject 15 Units into the skin 2 (two) times daily. 3 mL 5   Insulin  Pen Needle (BD PEN NEEDLE NANO 2ND GEN) 32G X 4 MM MISC USE TO INJECT INSULIN  UP TO 4 TIMES DAILY AS NEEDED 100 each 0   isosorbide  mononitrate (IMDUR ) 60 MG 24 hr tablet Take 1.5 tablets (90 mg total) by mouth daily. 45 tablet 3   nitroGLYCERIN  (NITROSTAT ) 0.4 MG SL tablet Place 1 tablet (0.4 mg total) under the tongue every 5 (five) minutes as needed for chest pain. 75 tablet 1   sacubitril -valsartan  (ENTRESTO ) 97-103 MG Take 1 tablet by mouth 2 (two) times daily. 180 tablet 3   Semaglutide ,0.25 or 0.5MG /DOS, (OZEMPIC , 0.25 OR 0.5 MG/DOSE,) 2 MG/3ML SOPN Inject 0.25 mg into the skin once a week. 3 mL 1   spironolactone  (ALDACTONE ) 25 MG tablet Take 1 tablet (25 mg total) by mouth daily. 90 tablet 3   valACYclovir  (VALTREX ) 500 MG tablet Take 1 tablet (500 mg total) by mouth 2 (two) times daily. (Patient taking differently: Take 500 mg by mouth as needed.) 180 tablet 1   No current facility-administered medications for this visit.     Review of Systems    She denies palpitations, pnd, orthopnea, n, v, dizziness, syncope, edema, weight gain, or early satiety. All other systems reviewed and are otherwise negative except as noted above.   Physical Exam    VS:  BP 134/82   Pulse 92   Ht 5' 2 (1.575 m)   Wt (!) 322 lb (146.1 kg)   SpO2 94%   BMI 58.89 kg/m  GEN: Well nourished, well developed, in no acute distress. HEENT: normal. Neck: Supple, no JVD, carotid bruits, or masses. Cardiac: RRR, no murmurs, rubs, or gallops. No  clubbing, cyanosis, edema.  Radials/DP/PT 2+ and equal bilaterally.  Respiratory:  Respirations regular and unlabored, clear to auscultation bilaterally. GI: Soft, nontender, nondistended, BS + x 4. MS: no deformity or atrophy. Skin: warm and dry, no rash. Neuro:  Strength and sensation are intact. Psych: Normal affect.  Accessory Clinical Findings    ECG personally reviewed by me today - EKG Interpretation Date/Time:  Thursday May 07 2024 10:35:58 EDT Ventricular Rate:  92 PR Interval:  146 QRS Duration:  94 QT Interval:  390 QTC Calculation: 482 R Axis:   44  Text Interpretation: Normal sinus rhythm Septal infarct (cited on or before 06-Feb-2024) When compared with ECG of 07-Feb-2024 09:17, No significant change was found Confirmed by Daneen Perkins ([id number]) on 05/07/2024 10:57:12 AM  - no acute changes.   Lab Results  Component Value Date   WBC 8.1 02/09/2024   HGB 10.1 (L)  02/09/2024   HCT 30.7 (L) 02/09/2024   MCV 90.8 02/09/2024   PLT 277 02/09/2024   Lab Results  Component Value Date   CREATININE 1.60 (H) 02/09/2024   BUN 33 (H) 02/09/2024   NA 137 02/09/2024   K 3.6 02/09/2024   CL 102 02/09/2024   CO2 26 02/09/2024   Lab Results  Component Value Date   ALT 10 02/05/2024   AST 17 02/05/2024   ALKPHOS 119 02/05/2024   BILITOT 0.2 02/05/2024   Lab Results  Component Value Date   CHOL 177 11/07/2023   HDL 43 11/07/2023   LDLCALC 109 (H) 11/07/2023   TRIG 125 11/07/2023   CHOLHDL 4.1 11/07/2023    Lab Results  Component Value Date   HGBA1C 9.7 (H) 02/06/2024    Assessment & Plan    1. CAD: CAD s/p NSTEMI, DESx 4 (p-mLAD, Ramus, pRCA) in 2019. Myoview  in January 2021 showed EF 36%, intermediate risk study with apical scar and moderate anterior apical ischemia with this scarring of anterior septum. Medical management was advised.  She was hospitalized in March 2025 in the setting of NSTEMI. Echo showed EF 45%, mildly decreased LV function, G1 DD,  elevated left atrial pressure, normal RV systolic function, mild mitral valve regurgitation, no significant change from prior echo Ischemic evaluation was deferred.  She was hospitalized again in June 2025 in the setting of chest pain.  Cardiac catheterization showed severe multivessel CAD, widely patent LAD and RCA stents, focal stenosis in the ramus branch as well as the mid LAD PCI was deferred, she was referred to CT surgery who felt she was a poor candidate for surgery in the setting of morbid obesity, uncontrolled diabetes. She continues to note symptoms of intermittent chest discomfort both at rest and with activity, as well as ongoing dyspnea on exertion despite increased Imdur  dosing.  She is asking about the possibility of PCI.  Reviewed with Dr. Mona who recommended reviewing case with Dr. Wonda.  Reviewed with Dr. Wonda, who agrees she may benefit from possible PCI/stenting of her OM.  LHC scheduled for 05/14/2024 with Dr. Wonda.  She will hold Lasix , spironolactone  morning of procedure.  She will hold her Humulin  insulin  the morning of procedure and will take half of her dose the night before.  Hold Ozempic  1 week prior to cath.  Will reach out to cath lab to see if she will need precath fluids.   Will check CBC, CMET, fasting lipids today.  Reviewed ED precautions.  Continue aspirin , Plavix , carvedilol , Entresto , spironolactone , Lasix , Imdur , Lipitor , and Repatha .  Informed Consent   Shared Decision Making/Informed Consent The risks [stroke (1 in 1000), death (1 in 1000), kidney failure [usually temporary] (1 in 500), bleeding (1 in 200), allergic reaction [possibly serious] (1 in 200)], benefits (diagnostic support and management of coronary artery disease) and alternatives of a cardiac catheterization were discussed in detail with Ms. Bartolini and she is willing to proceed.     2.  Heart failure with improved EF: Diagnosed in 2019 in the setting of NSTEMI, EF 35 to 40% at the time.. Echo in  11/2023 showed EF 45%, mildly decreased LV function, G1 DD, elevated left atrial pressure, normal RV systolic function, mild mitral valve regurgitation, no significant change from prior echo.  Limited echo in 02/2024 showed EF 50 to 55%, low normal LV function, no RWMA, normal RV systolic function, no significant valvular abnormalities. Euvolemic and well compensated on exam.  She did not tolerate Jardiance   due to dizziness.  Will discontinue.  Continue carvedilol , Entresto , spironolactone , and Lasix .   3. Lightheadedness/dizziness: Cardiac monitor was unremarkable. Most recent echo as above. CT of the neck in January 2023 showed less than 50% stenosis of R ICA, not considered hemodynamically significant.  Dizziness improved with discontinuation of Jardiance  as above.   4. Hypertension: BP well controlled. Continue current antihypertensive regimen.   5. Hyperlipidemia: LDL was 109 in 11/2023, above goal.  She is no longer taking Zetia  (she reported swelling in her mouth and throat).  She was started on Repatha .  Pending repeat labs.  Continue Repatha , Lipitor .  6. H/o CVA: Hospitalized 08/2021 in the setting of CVA. Bubble study was negative for PFO. TEE without cardiac source of embolism.  30-day event monitor showed normal sinus rhythm, no signs of atrial fibrillation or other arrhythmia.  Followed by neurology.  Continue aspirin , Plavix , Lipitor .  7. Type 2 diabetes/obesity: A1c was 9.7 in 02/2024.  She is pending appointment with endocrinology.  Currently monitored and managed per PCP.   8.  CKD stage IIIb: Creatinine was 1.6 in 02/2024, eGFR 43.  Pending repeat CMET as above.    9. Possible OSA: She has pending ITAMAR sleep study.  This has been set up at today's office visit.   10. Disposition: Follow-up 2 weeks post cath.      [name] JAYSON Braver, NP 05/07/2024, 10:58 AM

## 2024-05-06 NOTE — H&P (View-Only) (Signed)
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 was started on Ozempic  and Repatha .  It was noted that should she have progressive symptoms concerning for angina, PCI could be considered.    She presents today for follow-up accompanied by her son. Since her last visit she continues to note daily chest pain both at rest and with activity, dyspnea on exertion.  She stopped taking her Jardiance  in the setting of dizziness, she does not wish to resume Jardiance  at this time.  She is concerned about her ongoing symptoms and is interested in discussing possible PCI.  Home Medications    Current Outpatient Medications  Medication Sig Dispense Refill   aspirin  EC 81 MG tablet Take 81 mg by mouth daily. Swallow whole.  atorvastatin  (LIPITOR ) 80 MG tablet Take 1 tablet (80 mg total) by mouth daily. 90 tablet 3   carvedilol  (COREG ) 12.5 MG tablet Take 3 tablets (37.5 mg total) by mouth 2 (two) times daily with a meal. 180 tablet 3   clopidogrel  (PLAVIX ) 75 MG tablet Take 1 tablet (75 mg total) by mouth daily. 90 tablet 3   Evolocumab  (REPATHA   SURECLICK) 140 MG/ML SOAJ Inject 140 mg into the skin every 14 (fourteen) days. 6 mL 3   furosemide  (LASIX ) 40 MG tablet Take 1 tablet (40 mg total) by mouth 2 (two) times daily. 180 tablet 3   glipiZIDE  (GLUCOTROL  XL) 5 MG 24 hr tablet Take 1 tablet (5 mg total) by mouth daily with breakfast. 90 tablet 1   insulin  isophane & regular human KwikPen (HUMULIN  70/30 KWIKPEN) (70-30) 100 UNIT/ML KwikPen Inject 15 Units into the skin 2 (two) times daily. 3 mL 5   Insulin  Pen Needle (BD PEN NEEDLE NANO 2ND GEN) 32G X 4 MM MISC USE TO INJECT INSULIN  UP TO 4 TIMES DAILY AS NEEDED 100 each 0   isosorbide  mononitrate (IMDUR ) 60 MG 24 hr tablet Take 1.5 tablets (90 mg total) by mouth daily. 45 tablet 3   nitroGLYCERIN  (NITROSTAT ) 0.4 MG SL tablet Place 1 tablet (0.4 mg total) under the tongue every 5 (five) minutes as needed for chest pain. 75 tablet 1   sacubitril -valsartan  (ENTRESTO ) 97-103 MG Take 1 tablet by mouth 2 (two) times daily. 180 tablet 3   Semaglutide ,0.25 or 0.5MG /DOS, (OZEMPIC , 0.25 OR 0.5 MG/DOSE,) 2 MG/3ML SOPN Inject 0.25 mg into the skin once a week. 3 mL 1   spironolactone  (ALDACTONE ) 25 MG tablet Take 1 tablet (25 mg total) by mouth daily. 90 tablet 3   valACYclovir  (VALTREX ) 500 MG tablet Take 1 tablet (500 mg total) by mouth 2 (two) times daily. (Patient taking differently: Take 500 mg by mouth as needed.) 180 tablet 1   No current facility-administered medications for this visit.     Review of Systems    She denies palpitations, pnd, orthopnea, n, v, dizziness, syncope, edema, weight gain, or early satiety. All other systems reviewed and are otherwise negative except as noted above.   Physical Exam    VS:  BP 134/82   Pulse 92   Ht 5' 2 (1.575 m)   Wt (!) 322 lb (146.1 kg)   SpO2 94%   BMI 58.89 kg/m  GEN: Well nourished, well developed, in no acute distress. HEENT: normal. Neck: Supple, no JVD, carotid bruits, or masses. Cardiac: RRR, no murmurs, rubs, or gallops. No  clubbing, cyanosis, edema.  Radials/DP/PT 2+ and equal bilaterally.  Respiratory:  Respirations regular and unlabored, clear to auscultation bilaterally. GI: Soft, nontender, nondistended, BS + x 4. MS: no deformity or atrophy. Skin: warm and dry, no rash. Neuro:  Strength and sensation are intact. Psych: Normal affect.  Accessory Clinical Findings    ECG personally reviewed by me today - EKG Interpretation Date/Time:  Thursday May 07 2024 10:35:58 EDT Ventricular Rate:  92 PR Interval:  146 QRS Duration:  94 QT Interval:  390 QTC Calculation: 482 R Axis:   44  Text Interpretation: Normal sinus rhythm Septal infarct (cited on or before 06-Feb-2024) When compared with ECG of 07-Feb-2024 09:17, No significant change was found Confirmed by Daneen Perkins ([id number]) on 05/07/2024 10:57:12 AM  - no acute changes.   Lab Results  Component Value Date   WBC 8.1 02/09/2024   HGB 10.1 (L)  02/09/2024   HCT 30.7 (L) 02/09/2024   MCV 90.8 02/09/2024   PLT 277 02/09/2024   Lab Results  Component Value Date   CREATININE 1.60 (H) 02/09/2024   BUN 33 (H) 02/09/2024   NA 137 02/09/2024   K 3.6 02/09/2024   CL 102 02/09/2024   CO2 26 02/09/2024   Lab Results  Component Value Date   ALT 10 02/05/2024   AST 17 02/05/2024   ALKPHOS 119 02/05/2024   BILITOT 0.2 02/05/2024   Lab Results  Component Value Date   CHOL 177 11/07/2023   HDL 43 11/07/2023   LDLCALC 109 (H) 11/07/2023   TRIG 125 11/07/2023   CHOLHDL 4.1 11/07/2023    Lab Results  Component Value Date   HGBA1C 9.7 (H) 02/06/2024    Assessment & Plan    1. CAD: CAD s/p NSTEMI, DESx 4 (p-mLAD, Ramus, pRCA) in 2019. Myoview  in January 2021 showed EF 36%, intermediate risk study with apical scar and moderate anterior apical ischemia with this scarring of anterior septum. Medical management was advised.  She was hospitalized in March 2025 in the setting of NSTEMI. Echo showed EF 45%, mildly decreased LV function, G1 DD,  elevated left atrial pressure, normal RV systolic function, mild mitral valve regurgitation, no significant change from prior echo Ischemic evaluation was deferred.  She was hospitalized again in June 2025 in the setting of chest pain.  Cardiac catheterization showed severe multivessel CAD, widely patent LAD and RCA stents, focal stenosis in the ramus branch as well as the mid LAD PCI was deferred, she was referred to CT surgery who felt she was a poor candidate for surgery in the setting of morbid obesity, uncontrolled diabetes. She continues to note symptoms of intermittent chest discomfort both at rest and with activity, as well as ongoing dyspnea on exertion despite increased Imdur  dosing.  She is asking about the possibility of PCI.  Reviewed with Dr. Mona who recommended reviewing case with Dr. Wonda.  Reviewed with Dr. Wonda, who agrees she may benefit from possible PCI/stenting of her OM.  LHC scheduled for 05/14/2024 with Dr. Wonda.  She will hold Lasix , spironolactone  morning of procedure.  She will hold her Humulin  insulin  the morning of procedure and will take half of her dose the night before.  Hold Ozempic  1 week prior to cath.  Will reach out to cath lab to see if she will need precath fluids.   Will check CBC, CMET, fasting lipids today.  Reviewed ED precautions.  Continue aspirin , Plavix , carvedilol , Entresto , spironolactone , Lasix , Imdur , Lipitor , and Repatha .  Informed Consent   Shared Decision Making/Informed Consent The risks [stroke (1 in 1000), death (1 in 1000), kidney failure [usually temporary] (1 in 500), bleeding (1 in 200), allergic reaction [possibly serious] (1 in 200)], benefits (diagnostic support and management of coronary artery disease) and alternatives of a cardiac catheterization were discussed in detail with Ms. Heath and she is willing to proceed.     2.  Heart failure with improved EF: Diagnosed in 2019 in the setting of NSTEMI, EF 35 to 40% at the time.. Echo in  11/2023 showed EF 45%, mildly decreased LV function, G1 DD, elevated left atrial pressure, normal RV systolic function, mild mitral valve regurgitation, no significant change from prior echo.  Limited echo in 02/2024 showed EF 50 to 55%, low normal LV function, no RWMA, normal RV systolic function, no significant valvular abnormalities. Euvolemic and well compensated on exam.  She did not tolerate Jardiance   due to dizziness.  Will discontinue.  Continue carvedilol , Entresto , spironolactone , and Lasix .   3. Lightheadedness/dizziness: Cardiac monitor was unremarkable. Most recent echo as above. CT of the neck in January 2023 showed less than 50% stenosis of R ICA, not considered hemodynamically significant.  Dizziness improved with discontinuation of Jardiance  as above.   4. Hypertension: BP well controlled. Continue current antihypertensive regimen.   5. Hyperlipidemia: LDL was 109 in 11/2023, above goal.  She is no longer taking Zetia  (she reported swelling in her mouth and throat).  She was started on Repatha .  Pending repeat labs.  Continue Repatha , Lipitor .  6. H/o CVA: Hospitalized 08/2021 in the setting of CVA. Bubble study was negative for PFO. TEE without cardiac source of embolism.  30-day event monitor showed normal sinus rhythm, no signs of atrial fibrillation or other arrhythmia.  Followed by neurology.  Continue aspirin , Plavix , Lipitor .  7. Type 2 diabetes/obesity: A1c was 9.7 in 02/2024.  She is pending appointment with endocrinology.  Currently monitored and managed per PCP.   8.  CKD stage IIIb: Creatinine was 1.6 in 02/2024, eGFR 43.  Pending repeat CMET as above.    9. Possible OSA: She has pending ITAMAR sleep study.  This has been set up at today's office visit.   10. Disposition: Follow-up 2 weeks post cath.      [name] JAYSON Braver, NP 05/07/2024, 10:58 AM

## 2024-05-07 ENCOUNTER — Ambulatory Visit: Attending: Nurse Practitioner | Admitting: Nurse Practitioner

## 2024-05-07 ENCOUNTER — Encounter (HOSPITAL_BASED_OUTPATIENT_CLINIC_OR_DEPARTMENT_OTHER): Payer: Self-pay | Admitting: Cardiology

## 2024-05-07 ENCOUNTER — Encounter: Payer: Self-pay | Admitting: Nurse Practitioner

## 2024-05-07 ENCOUNTER — Telehealth: Payer: Self-pay

## 2024-05-07 VITALS — BP 134/82 | HR 92 | Ht 62.0 in | Wt 322.0 lb

## 2024-05-07 DIAGNOSIS — R0683 Snoring: Secondary | ICD-10-CM | POA: Diagnosis not present

## 2024-05-07 DIAGNOSIS — I25110 Atherosclerotic heart disease of native coronary artery with unstable angina pectoris: Secondary | ICD-10-CM | POA: Insufficient documentation

## 2024-05-07 DIAGNOSIS — I10 Essential (primary) hypertension: Secondary | ICD-10-CM | POA: Insufficient documentation

## 2024-05-07 DIAGNOSIS — N1832 Chronic kidney disease, stage 3b: Secondary | ICD-10-CM | POA: Insufficient documentation

## 2024-05-07 DIAGNOSIS — I5032 Chronic diastolic (congestive) heart failure: Secondary | ICD-10-CM | POA: Insufficient documentation

## 2024-05-07 DIAGNOSIS — R42 Dizziness and giddiness: Secondary | ICD-10-CM | POA: Insufficient documentation

## 2024-05-07 DIAGNOSIS — I25118 Atherosclerotic heart disease of native coronary artery with other forms of angina pectoris: Secondary | ICD-10-CM

## 2024-05-07 DIAGNOSIS — E1165 Type 2 diabetes mellitus with hyperglycemia: Secondary | ICD-10-CM | POA: Insufficient documentation

## 2024-05-07 DIAGNOSIS — E785 Hyperlipidemia, unspecified: Secondary | ICD-10-CM | POA: Insufficient documentation

## 2024-05-07 DIAGNOSIS — I5042 Chronic combined systolic (congestive) and diastolic (congestive) heart failure: Secondary | ICD-10-CM

## 2024-05-07 NOTE — Telephone Encounter (Signed)
**Note De-Identified [name] Obfuscation** Ordering provider: Dr Mona  Associated diagnoses: At risk for sleep apnea -Z91.89  WatchPAT PA obtained on 05/07/2024 by Lauren Delgado, Lauren HERO, LPN. Authorization: Per the [facility] provider portal a PA is not required for CPT Code: 04199. Decision ID #: [id number]   Patient notified of PIN (1234) on 05/07/2024 Lauren Delgado Notification Method: in person while at an office visit today with [name] Braver, NP.   Phone note routed to covering staff for follow-up.

## 2024-05-07 NOTE — Patient Instructions (Addendum)
 Medication Instructions:  Stop Jardiance    *If you need a refill on your cardiac medications before your next appointment, please call your pharmacy*  Lab Work: Complete Previous labs and CBC & CMET today  Testing/Procedures: Your physician has requested that you have a Left cardiac catheterization. Cardiac catheterization is used to diagnose and/or treat various heart conditions. Doctors may recommend this procedure for a number of different reasons. The most common reason is to evaluate chest pain. Chest pain can be a symptom of coronary artery disease (CAD), and cardiac catheterization can show whether plaque is narrowing or blocking your heart's arteries. This procedure is also used to evaluate the valves, as well as measure the blood flow and oxygen levels in different parts of your heart. For further information please visit [email or web address] Please follow instruction sheet, as given.   Complete Sleep Study previously ordered  Follow-Up: At [facility], you and your health needs are our priority.  As part of our continuing mission to provide you with exceptional heart care, our providers are all part of one team.  This team includes your primary Cardiologist (physician) and Advanced Practice Providers or APPs (Physician Assistants and Nurse Practitioners) who all work together to provide you with the care you need, when you need it.  Your next appointment:    2 WEEKS POST CATH [name] Braver NP or Katlyn West NP  Provider:   [name] Braver, NP or Katlyn West, NP          We recommend signing up for the patient portal called MyChart.  Sign up information is provided on this After Visit Summary.  MyChart is used to connect with patients for Virtual Visits (Telemedicine).  Patients are able to view lab/test results, encounter notes, upcoming appointments, etc.  Non-urgent messages can be sent to your provider as well.   To learn more about what you can do with MyChart, go to  [email or web address].

## 2024-05-08 DIAGNOSIS — E119 Type 2 diabetes mellitus without complications: Secondary | ICD-10-CM | POA: Diagnosis not present

## 2024-05-08 DIAGNOSIS — E785 Hyperlipidemia, unspecified: Secondary | ICD-10-CM | POA: Diagnosis not present

## 2024-05-08 DIAGNOSIS — I10 Essential (primary) hypertension: Secondary | ICD-10-CM | POA: Diagnosis not present

## 2024-05-08 DIAGNOSIS — I25118 Atherosclerotic heart disease of native coronary artery with other forms of angina pectoris: Secondary | ICD-10-CM | POA: Diagnosis not present

## 2024-05-08 DIAGNOSIS — R42 Dizziness and giddiness: Secondary | ICD-10-CM | POA: Diagnosis not present

## 2024-05-08 DIAGNOSIS — E1165 Type 2 diabetes mellitus with hyperglycemia: Secondary | ICD-10-CM | POA: Diagnosis not present

## 2024-05-08 DIAGNOSIS — I5042 Chronic combined systolic (congestive) and diastolic (congestive) heart failure: Secondary | ICD-10-CM | POA: Diagnosis not present

## 2024-05-08 LAB — CBC

## 2024-05-09 LAB — COMPREHENSIVE METABOLIC PANEL WITH GFR
ALT: 9 IU/L (ref 0–32)
AST: 13 IU/L (ref 0–40)
Albumin: 3.6 g/dL — ABNORMAL LOW (ref 3.9–4.9)
Alkaline Phosphatase: 121 IU/L (ref 44–121)
BUN/Creatinine Ratio: 19 (ref 9–23)
BUN: 27 mg/dL — ABNORMAL HIGH (ref 6–20)
Bilirubin Total: 0.3 mg/dL (ref 0.0–1.2)
CO2: 22 mmol/L (ref 20–29)
Calcium: 9 mg/dL (ref 8.7–10.2)
Chloride: 99 mmol/L (ref 96–106)
Creatinine, Ser: 1.44 mg/dL — ABNORMAL HIGH (ref 0.57–1.00)
Globulin, Total: 3.4 g/dL (ref 1.5–4.5)
Glucose: 204 mg/dL — ABNORMAL HIGH (ref 70–99)
Potassium: 3.8 mmol/L (ref 3.5–5.2)
Sodium: 139 mmol/L (ref 134–144)
Total Protein: 7 g/dL (ref 6.0–8.5)
eGFR: 48 mL/min/1.73 — ABNORMAL LOW (ref >59)

## 2024-05-09 LAB — CBC
Hematocrit: 31.6 % — ABNORMAL LOW (ref 34.0–46.6)
Hemoglobin: 10.1 g/dL — ABNORMAL LOW (ref 11.1–15.9)
MCH: 29 pg (ref 26.6–33.0)
MCHC: 32 g/dL (ref 31.5–35.7)
MCV: 91 fL (ref 79–97)
Platelets: 363 x10E3/uL (ref 150–450)
RBC: 3.48 x10E6/uL — ABNORMAL LOW (ref 3.77–5.28)
RDW: 13.2 % (ref 11.7–15.4)
WBC: 9.2 x10E3/uL (ref 3.4–10.8)

## 2024-05-09 LAB — LIPID PANEL
Chol/HDL Ratio: 2 ratio (ref 0.0–4.4)
Cholesterol, Total: 66 mg/dL — ABNORMAL LOW (ref 100–199)
HDL: 33 mg/dL — ABNORMAL LOW (ref >39)
LDL Chol Calc (NIH): 14 mg/dL (ref 0–99)
Triglycerides: 94 mg/dL (ref 0–149)
VLDL Cholesterol Cal: 19 mg/dL (ref 5–40)

## 2024-05-09 LAB — APOLIPOPROTEIN B: Apolipoprotein B: 25 mg/dL

## 2024-05-09 NOTE — Procedures (Signed)
   SLEEP STUDY REPORT Patient Information Study Date: 05/07/2024 Patient Name: Lauren Delgado Patient ID: [id number] Birth Date: 07-Jul-1986 Age: 38 Gender: Female BMI: 83.8 (W=322 lb, H=4' 4'') Referring Physician: [name] Braver, NP  TEST DESCRIPTION: Home sleep apnea testing was completed using the WatchPat, a Type 1 device, utilizing peripheral arterial tonometry (PAT), chest movement, actigraphy, pulse oximetry, pulse rate, body position and snore. AHI was calculated with apnea and hypopnea using valid sleep time as the denominator. RDI includes apneas, hypopneas, and RERAs. The data acquired and the scoring of sleep and all associated events were performed in accordance with the recommended standards and specifications as outlined in the AASM Manual for the Scoring of Sleep and Associated Events 2.2.0 (2015).  FINDINGS: 1. No evidence of Obstructive Sleep Apnea with AHI 3.9/hr. 2. No Central Sleep Apnea. 3. Oxygen desaturations as low as 83%. 4. Mild snoring was present. O2 sats were < 88% for 0.3 minutes. 5. Total sleep time was 5 hrs and 43 min. 6. 23.2% of total sleep time was spent in REM sleep. 7. Normal sleep onset latency at 17 min. 8. Shortened REM sleep onset latency at 77 min. 9. Total awakenings were 5. DIAGNOSIS: Normal study with no significant sleep disordered breathing.  RECOMMENDATIONS: 1. Normal study with no significant sleep disordered breathing. 2. Healthy sleep recommendations include: adequate nightly sleep (normal 7-9 hrs/night), avoidance of caffeine after noon and alcohol near bedtime, and maintaining a sleep environment that is cool, dark and quiet. 3. Weight loss for overweight patients is recommended. 4. Snoring recommendations include: weight loss where appropriate, side sleeping, and avoidance of alcohol before bed. 5. Operation of motor vehicle or dangerous equipment must be avoided when feeling drowsy, excessively sleepy, or mentally  fatigued. 6. An ENT consultation which may be useful for specific causes of and possible treatment of bothersome snoring . 7. Weight loss may be of benefit in reducing the severity of snoring.   Signature: Wilbert Bihari, MD; [facility]; Diplomat, American Board of Sleep Medicine Electronically Signed: 05/09/2024 7:06:55 PM

## 2024-05-11 ENCOUNTER — Ambulatory Visit: Payer: Self-pay | Admitting: *Deleted

## 2024-05-11 ENCOUNTER — Ambulatory Visit: Attending: Internal Medicine

## 2024-05-11 ENCOUNTER — Other Ambulatory Visit: Payer: Self-pay

## 2024-05-11 ENCOUNTER — Ambulatory Visit: Payer: Self-pay | Admitting: Pharmacist

## 2024-05-11 ENCOUNTER — Other Ambulatory Visit (HOSPITAL_COMMUNITY): Payer: Self-pay

## 2024-05-11 DIAGNOSIS — Z9189 Other specified personal risk factors, not elsewhere classified: Secondary | ICD-10-CM

## 2024-05-11 MED ORDER — ATORVASTATIN CALCIUM 40 MG PO TABS
40.0000 mg | ORAL_TABLET | Freq: Every day | ORAL | 3 refills | Status: AC
Start: 2024-05-11 — End: 2024-11-08
  Filled 2024-05-11: qty 90, 90d supply, fill #0
  Filled 2024-07-23: qty 90, 90d supply, fill #1

## 2024-05-11 NOTE — Addendum Note (Signed)
 Addended by: Abran Gavigan D on: 05/11/2024 03:02 PM   Modules accepted: Orders

## 2024-05-14 ENCOUNTER — Ambulatory Visit (HOSPITAL_COMMUNITY)
Admission: RE | Admit: 2024-05-14 | Discharge: 2024-05-14 | Disposition: A | Discharge status: Home / Self Care | Attending: Cardiovascular Disease | Admitting: Cardiovascular Disease

## 2024-05-14 ENCOUNTER — Other Ambulatory Visit: Payer: Self-pay

## 2024-05-14 ENCOUNTER — Encounter (HOSPITAL_COMMUNITY)
Admission: RE | Disposition: A | Discharge status: Home / Self Care | Payer: Self-pay | Source: Home / Self Care | Attending: Cardiovascular Disease

## 2024-05-14 ENCOUNTER — Other Ambulatory Visit: Payer: Self-pay | Admitting: Emergency Medicine

## 2024-05-14 DIAGNOSIS — I25110 Atherosclerotic heart disease of native coronary artery with unstable angina pectoris: Secondary | ICD-10-CM

## 2024-05-14 DIAGNOSIS — Z7985 Long-term (current) use of injectable non-insulin antidiabetic drugs: Secondary | ICD-10-CM | POA: Diagnosis not present

## 2024-05-14 DIAGNOSIS — Z955 Presence of coronary angioplasty implant and graft: Secondary | ICD-10-CM | POA: Insufficient documentation

## 2024-05-14 DIAGNOSIS — I130 Hypertensive heart and chronic kidney disease with heart failure and stage 1 through stage 4 chronic kidney disease, or unspecified chronic kidney disease: Secondary | ICD-10-CM | POA: Diagnosis not present

## 2024-05-14 DIAGNOSIS — Z6841 Body Mass Index (BMI) 40.0 and over, adult: Secondary | ICD-10-CM | POA: Insufficient documentation

## 2024-05-14 DIAGNOSIS — I25118 Atherosclerotic heart disease of native coronary artery with other forms of angina pectoris: Secondary | ICD-10-CM | POA: Diagnosis present

## 2024-05-14 DIAGNOSIS — N1832 Chronic kidney disease, stage 3b: Secondary | ICD-10-CM | POA: Diagnosis present

## 2024-05-14 DIAGNOSIS — E119 Type 2 diabetes mellitus without complications: Secondary | ICD-10-CM

## 2024-05-14 DIAGNOSIS — E1122 Type 2 diabetes mellitus with diabetic chronic kidney disease: Secondary | ICD-10-CM | POA: Diagnosis not present

## 2024-05-14 DIAGNOSIS — I252 Old myocardial infarction: Secondary | ICD-10-CM | POA: Diagnosis not present

## 2024-05-14 DIAGNOSIS — Y831 Surgical operation with implant of artificial internal device as the cause of abnormal reaction of the patient, or of later complication, without mention of misadventure at the time of the procedure: Secondary | ICD-10-CM | POA: Diagnosis not present

## 2024-05-14 DIAGNOSIS — T82855A Stenosis of coronary artery stent, initial encounter: Secondary | ICD-10-CM | POA: Insufficient documentation

## 2024-05-14 DIAGNOSIS — Z79899 Other long term (current) drug therapy: Secondary | ICD-10-CM | POA: Diagnosis not present

## 2024-05-14 DIAGNOSIS — I25119 Atherosclerotic heart disease of native coronary artery with unspecified angina pectoris: Secondary | ICD-10-CM | POA: Diagnosis not present

## 2024-05-14 DIAGNOSIS — I5042 Chronic combined systolic (congestive) and diastolic (congestive) heart failure: Secondary | ICD-10-CM | POA: Diagnosis not present

## 2024-05-14 DIAGNOSIS — Z8673 Personal history of transient ischemic attack (TIA), and cerebral infarction without residual deficits: Secondary | ICD-10-CM | POA: Insufficient documentation

## 2024-05-14 DIAGNOSIS — Z7982 Long term (current) use of aspirin: Secondary | ICD-10-CM | POA: Insufficient documentation

## 2024-05-14 DIAGNOSIS — E1165 Type 2 diabetes mellitus with hyperglycemia: Secondary | ICD-10-CM

## 2024-05-14 DIAGNOSIS — Z7984 Long term (current) use of oral hypoglycemic drugs: Secondary | ICD-10-CM | POA: Diagnosis not present

## 2024-05-14 DIAGNOSIS — I340 Nonrheumatic mitral (valve) insufficiency: Secondary | ICD-10-CM | POA: Diagnosis not present

## 2024-05-14 DIAGNOSIS — E1159 Type 2 diabetes mellitus with other circulatory complications: Secondary | ICD-10-CM | POA: Diagnosis present

## 2024-05-14 DIAGNOSIS — Z7902 Long term (current) use of antithrombotics/antiplatelets: Secondary | ICD-10-CM | POA: Diagnosis not present

## 2024-05-14 DIAGNOSIS — E785 Hyperlipidemia, unspecified: Secondary | ICD-10-CM | POA: Insufficient documentation

## 2024-05-14 DIAGNOSIS — Z794 Long term (current) use of insulin: Secondary | ICD-10-CM | POA: Diagnosis not present

## 2024-05-14 HISTORY — PX: CORONARY STENT INTERVENTION: CATH118234

## 2024-05-14 LAB — GLUCOSE, CAPILLARY
Glucose-Capillary: 223 mg/dL — ABNORMAL HIGH (ref 70–99)
Glucose-Capillary: 134 mg/dL — ABNORMAL HIGH (ref 70–99)

## 2024-05-14 LAB — PREGNANCY, URINE: Preg Test, Ur: NEGATIVE

## 2024-05-14 LAB — POCT ACTIVATED CLOTTING TIME: Activated Clotting Time: 285 s

## 2024-05-14 SURGERY — CORONARY STENT INTERVENTION
Anesthesia: LOCAL

## 2024-05-14 MED ORDER — IOHEXOL 350 MG/ML SOLN
INTRAVENOUS | Status: DC | PRN
  Administered 2024-05-14: 120 mL

## 2024-05-14 MED ORDER — HEPARIN (PORCINE) IN NACL 1000-0.9 UT/500ML-% IV SOLN
INTRAVENOUS | Status: DC | PRN
  Administered 2024-05-14 (×2): 500 mL

## 2024-05-14 MED ORDER — HYDRALAZINE HCL 20 MG/ML IJ SOLN: 10.0000 mg | INTRAMUSCULAR | Status: DC | PRN

## 2024-05-14 MED ORDER — SODIUM CHLORIDE 0.9% FLUSH: 3.0000 mL | INTRAVENOUS | Status: DC | PRN

## 2024-05-14 MED ORDER — LIDOCAINE HCL (PF) 1 % IJ SOLN
INTRAMUSCULAR | Status: AC
  Filled 2024-05-14: qty 30

## 2024-05-14 MED ORDER — SODIUM CHLORIDE 0.9 % IV SOLN
INTRAVENOUS | Status: DC | PRN
  Administered 2024-05-14: 10 mL/h via INTRAVENOUS

## 2024-05-14 MED ORDER — HEPARIN SODIUM (PORCINE) 1000 UNIT/ML IJ SOLN
INTRAMUSCULAR | Status: DC | PRN
  Administered 2024-05-14: 7000 [IU] via INTRAVENOUS
  Administered 2024-05-14: 6000 [IU] via INTRAVENOUS

## 2024-05-14 MED ORDER — VERAPAMIL HCL 2.5 MG/ML IV SOLN
INTRAVENOUS | Status: AC
  Filled 2024-05-14: qty 2

## 2024-05-14 MED ORDER — FENTANYL CITRATE (PF) 100 MCG/2ML IJ SOLN
INTRAMUSCULAR | Status: AC
  Filled 2024-05-14: qty 2

## 2024-05-14 MED ORDER — HEPARIN SODIUM (PORCINE) 1000 UNIT/ML IJ SOLN
INTRAMUSCULAR | Status: AC
  Filled 2024-05-14: qty 10

## 2024-05-14 MED ORDER — MIDAZOLAM HCL 2 MG/2ML IJ SOLN
INTRAMUSCULAR | Status: AC
  Filled 2024-05-14: qty 2

## 2024-05-14 MED ORDER — FREE WATER: 500.0000 mL | Freq: Once | Status: DC

## 2024-05-14 MED ORDER — LIDOCAINE HCL (PF) 1 % IJ SOLN
INTRAMUSCULAR | Status: DC | PRN
  Administered 2024-05-14: 2 mL

## 2024-05-14 MED ORDER — ASPIRIN 81 MG PO CHEW: 81.0000 mg | CHEWABLE_TABLET | ORAL | Status: DC

## 2024-05-14 MED ORDER — VERAPAMIL HCL 2.5 MG/ML IV SOLN
INTRAVENOUS | Status: DC | PRN
  Administered 2024-05-14: 10 mL via INTRA_ARTERIAL

## 2024-05-14 MED ORDER — LABETALOL HCL 5 MG/ML IV SOLN: 10.0000 mg | INTRAVENOUS | Status: DC | PRN

## 2024-05-14 MED ORDER — SODIUM CHLORIDE 0.9 % IV SOLN: 250.0000 mL | INTRAVENOUS | Status: DC | PRN

## 2024-05-14 MED ORDER — SODIUM CHLORIDE 0.9% FLUSH: 3.0000 mL | Freq: Two times a day (BID) | INTRAVENOUS | Status: DC

## 2024-05-14 MED ORDER — ONDANSETRON HCL 4 MG/2ML IJ SOLN: 4.0000 mg | Freq: Four times a day (QID) | INTRAMUSCULAR | Status: DC | PRN

## 2024-05-14 MED ORDER — MIDAZOLAM HCL 2 MG/2ML IJ SOLN
INTRAMUSCULAR | Status: DC | PRN
  Administered 2024-05-14: 2 mg via INTRAVENOUS

## 2024-05-14 MED ORDER — ACETAMINOPHEN 325 MG PO TABS: 650.0000 mg | ORAL_TABLET | ORAL | Status: DC | PRN

## 2024-05-14 MED ORDER — SODIUM CHLORIDE 0.9 % IV SOLN: INTRAVENOUS | Status: DC

## 2024-05-14 MED ORDER — FENTANYL CITRATE (PF) 100 MCG/2ML IJ SOLN
INTRAMUSCULAR | Status: DC | PRN
  Administered 2024-05-14: 50 ug via INTRAVENOUS

## 2024-05-14 SURGICAL SUPPLY — 18 items
BALLOON EMERGE MR 2.5X12 (BALLOONS) IMPLANT
BALLOON SAPPHIRE NC24 3.0X12 (BALLOONS) IMPLANT
BALLOON SCOREFLEX 2.75X10 (BALLOONS) IMPLANT
CATH INFINITI JR4 5F (CATHETERS) IMPLANT
CATH LAUNCHER 5F EBU3.5 (CATHETERS) IMPLANT
DEVICE RAD COMP TR BAND LRG (VASCULAR PRODUCTS) IMPLANT
ELECT DEFIB PAD ADLT CADENCE (PAD) IMPLANT
GLIDESHEATH SLEND SS 6F .021 (SHEATH) IMPLANT
GUIDEWIRE INQWIRE 1.5J.035X260 (WIRE) IMPLANT
KIT ENCORE 26 ADVANTAGE (KITS) IMPLANT
KIT SYRINGE INJ CVI SPIKEX1 (MISCELLANEOUS) IMPLANT
MAT PREVALON FULL STRYKER (MISCELLANEOUS) IMPLANT
PACK CARDIAC CATHETERIZATION (CUSTOM PROCEDURE TRAY) ×2 IMPLANT
SET ATX-X65L (MISCELLANEOUS) IMPLANT
SHEATH PROBE COVER 6X72 (BAG) IMPLANT
STATION PROTECTION PRESSURIZED (MISCELLANEOUS) IMPLANT
STENT SYNERGY XD 3.0X16 (Permanent Stent) IMPLANT
WIRE RUNTHROUGH .014X180CM (WIRE) IMPLANT

## 2024-05-14 NOTE — Interval H&P Note (Signed)
 History and Physical Interval Note:  05/14/2024 9:59 AM  Lauren Delgado  has presented today for surgery, with the diagnosis of cad.  The various methods of treatment have been discussed with the patient and family. After consideration of risks, benefits and other options for treatment, the patient has consented to  Procedure(s): CORONARY STENT INTERVENTION (N/A) as a surgical intervention.  The patient's history has been reviewed, patient examined, no change in status, stable for surgery.  I have reviewed the patient's chart and labs.  Questions were answered to the patient's satisfaction.     [name]

## 2024-05-14 NOTE — Progress Notes (Addendum)
 Pt was educated on stent card, stent location, Antiplatelet and ASA use, wt restrictions, no baths/daily wash-ups, s/s of infection, ex guidelines, s/s to stop exercising, NTG use and calling 911, heart healthy diet, risk factors and CRPII. Pt received materials on exercise, diet, and CRPII. Will refer to [facility].   Pt prefers [facility] over [facility]. Unsure how motivated pt is to do CR  Garen FORBES Candy MS, ACSM-CEP 05/14/2024 2:21 PM

## 2024-05-14 NOTE — Discharge Instructions (Addendum)
 Radial Site Care  This sheet gives you information about how to care for yourself after your procedure. Your health care provider may also give you more specific instructions. If you have problems or questions, contact your health care provider. What can I expect after the procedure? After the procedure, it is common to have: Bruising and tenderness at the catheter insertion area. Follow these instructions at home: Medicines Take over-the-counter and prescription medicines only as told by your health care provider. Insertion site care Follow instructions from your health care provider about how to take care of your insertion site. Make sure you: Wash your hands with soap and water  before you remove your bandage (dressing). If soap and water  are not available, use hand sanitizer. May remove dressing in 24 hours. Check your insertion site every day for signs of infection. Check for: Redness, swelling, or pain. Fluid or blood. Pus or a bad smell. Warmth. Do no take baths, swim, or use a hot tub for 5 days. You may shower 24-48 hours after the procedure. Remove the dressing and gently wash the site with plain soap and water . Pat the area dry with a clean towel. Do not rub the site. That could cause bleeding. Do not apply powder or lotion to the site. Activity  For 24 hours after the procedure, or as directed by your health care provider: Do not flex or bend the affected arm. Do not push or pull heavy objects with the affected arm. Do not drive yourself home from the hospital or clinic. You may drive 24 hours after the procedure. Do not operate machinery or power tools. KEEP ARM ELEVATED THE REMAINDER OF THE DAY. Do not push, pull or lift anything that is heavier than 10 lb for 5 days. Ask your health care provider when it is okay to: Return to work or school. Resume usual physical activities or sports. Resume sexual activity. General instructions If the catheter site starts to  bleed, raise your arm and put firm pressure on the site. If the bleeding does not stop, get help right away. This is a medical emergency. DRINK PLENTY OF FLUIDS FOR THE NEXT 2-3 DAYS. No alcohol consumption for 24 hours after receiving sedation. If you went home on the same day as your procedure, a responsible adult should be with you for the first 24 hours after you arrive home. Keep all follow-up visits as told by your health care provider. This is important. Contact a health care provider if: You have a fever. You have redness, swelling, or yellow drainage around your insertion site. Get help right away if: You have unusual pain at the radial site. The catheter insertion area swells very fast. The insertion area is bleeding, and the bleeding does not stop when you hold steady pressure on the area. Your arm or hand becomes pale, cool, tingly, or numb. These symptoms may represent a serious problem that is an emergency. Do not wait to see if the symptoms will go away. Get medical help right away. Call your local emergency services (911 in the [place] Do not drive yourself to the hospital. Summary After the procedure, it is common to have bruising and tenderness at the site. Follow instructions from your health care provider about how to take care of your radial site wound. Check the wound every day for signs of infection.  This information is not intended to replace advice given to you by your health care provider. Make sure you discuss any questions you have with  your health care provider. Document Revised: 09/25/2017 Document Reviewed: 09/25/2017 Elsevier Patient Education  2020 [employer].  ____________________________________________________________ Information about your medication: Plavix  (anti-platelet agent)  Generic Name (Brand): clopidogrel  (Plavix ), once daily medication  PURPOSE: You are taking this medication along with aspirin  to lower your chance of having a heart attack,  stroke, or blood clots in your heart stent. These can be fatal. Plavix  and aspirin  help prevent platelets from sticking together and forming a clot that can block an artery or your stent.   Common SIDE EFFECTS you may experience include: bruising or bleeding more easily, shortness of breath  Do not stop taking PLAVIX  without talking to the doctor who prescribes it for you. People who are treated with a stent and stop taking Plavix  too soon, have a higher risk of getting a blood clot in the stent, having a heart attack, or dying. If you stop Plavix  because of bleeding, or for other reasons, your risk of a heart attack or stroke may increase.   Avoid taking NSAID agents or anti-inflammatory medications such as ibuprofen , naproxen given increased bleed risk with plavix  - can use acetaminophen  (Tylenol ) if needed for pain.  Avoid taking over the counter stomach medications omeprazole (Prilosec) or esomeprazole (Nexium) since these do interact and make plavix  less effective - ask your pharmacist or doctor for alterative agents if needed for heartburn or GERD.   Tell all of your doctors and dentists that you are taking Plavix . They should talk to the doctor who prescribed Plavix  for you before you have any surgery or invasive procedure.   Contact your health care provider if you experience: severe or uncontrollable bleeding, pink/red/brown urine, vomiting blood or vomit that looks like coffee grounds, red or black stools (looks like tar), coughing up blood or blood clots ----------------------------------------------------------------------------------------------------------------------

## 2024-05-14 NOTE — Discharge Summary (Addendum)
 Discharge Summary for Same Day PCI   Patient ID: Lauren Delgado MRN: [id number]; DOB: 1985-10-01  Admit date: 05/14/2024 Discharge date: 05/14/2024  Primary Care Provider: Purcell [name], MD  Primary Cardiologist: [name] JAYSON Maxcy, MD  Primary Electrophysiologist:  None   Discharge Diagnoses    Principal Problem:   Coronary artery disease of native artery of native heart with stable angina pectoris [facility]) Active Problems:   Type 2 diabetes mellitus without complication (HCC)   Hypertension associated with diabetes (HCC)   Chronic combined systolic and diastolic heart failure (HCC)   Stage 3b chronic kidney disease (HCC)    Diagnostic Studies/Procedures    Cardiac Catheterization 05/14/2024: Diagnostic Dominance: Right  Intervention   1.  Chronic total occlusions of the RCA and left circumflex unchanged from previous studies 2.  Severe stenosis of the mid LAD treated successfully with a 3.0 x 16 mm Synergy DES 3.  Severe in-stent restenosis in a highly angulated portion of the ramus intermedius treated successfully with a 2.75 mm score flex balloon reducing 80% stenosis to 20% 4.  Normal LVEDP   Recommendations: Same-day PCI protocol.  Patient already taking aspirin  and clopidogrel .  Aggressive medical therapy for residual CAD.  If persistent angina, consider review of films with Dr. [place] to see if she might be a candidate for CTO-PCI of the right coronary artery _____________   History of Present Illness     Lauren Delgado is a 38 y.o. female with a history of CAD s/p NSTEMI with DES x4 in 2019 [p-mLAD, Ramus x2, pRCA], HFimEF, hypertension, hyperlipidemia, hx CVA, T2DM, CKD, and obesity who presented to a follow-up outpatient appointment with [name] braver NP on 9/4 where she reported chest pain and DOE. She has known severe multivessel disease, and she has previously been turned down by CT surgery due to uncontrolled T2DM and morbid obesity. Case was discussed  with Dr. Maxcy and Dr. Wonda who felt pursuing PCI would benefit patient and cardiac catheterization was arranged for further evaluation.  Hospital Course     The patient underwent cardiac cath as noted above with DES to the mid LAD. Plan to continue DAPT with ASA/plavix  which she was on prior to procedure today. The patient was seen by cardiac rehab while in short stay. There were no observed complications post cath. Radial cath site was re-evaluated prior to discharge and found to be stable without any complications. Instructions/precautions regarding cath site care were given prior to discharge.  Lauren Delgado was seen by Dr. Wonda and determined stable for discharge home. Follow up with our office has been arranged. Medications are listed below. Pertinent changes include none.    _____________  Cath/PCI Registry Performance & Quality Measures: Aspirin  prescribed? - Yes ADP Receptor Inhibitor (Plavix /Clopidogrel , Brilinta /Ticagrelor  or Effient/Prasugrel) prescribed (includes medically managed patients)? - Yes High Intensity Statin (Lipitor  40-80mg  or Crestor 20-40mg ) prescribed? - Yes For EF <40%, was ACEI/ARB prescribed? - Yes For EF <40%, Aldosterone Antagonist (Spironolactone  or Eplerenone) prescribed? - Yes Cardiac Rehab Phase II ordered (Included Medically managed Patients)? - Yes  _____________   Discharge Vitals Blood pressure 129/79, pulse 77, temperature 98 F (36.7 C), temperature source Oral, resp. rate 14, height 5' 2 (1.575 m), weight (!) 146.1 kg, SpO2 99%.  Filed Weights   05/14/24 0726  Weight: (!) 146.1 kg    Last Labs & Radiologic Studies    _____________  CARDIAC CATHETERIZATION Result Date: 05/14/2024 1.  Chronic total occlusions of the RCA and left  circumflex unchanged from previous studies 2.  Severe stenosis of the mid LAD treated successfully with a 3.0 x 16 mm Synergy DES 3.  Severe in-stent restenosis in a highly angulated portion of the ramus  intermedius treated successfully with a 2.75 mm score flex balloon reducing 80% stenosis to 20% 4.  Normal LVEDP Recommendations: Same-day PCI protocol.  Patient already taking aspirin  and clopidogrel .  Aggressive medical therapy for residual CAD.  If persistent angina, consider review of films with Dr. [place] to see if she might be a candidate for CTO-PCI of the right coronary artery    Disposition   Pt is being discharged home today in good condition.  Follow-up Plans & Appointments     Discharge Instructions     Amb Referral to Cardiac Rehabilitation   Complete by: As directed    Diagnosis: Coronary Stents   After initial evaluation and assessments completed: Virtual Based Care may be provided alone or in conjunction with Phase 2 Cardiac Rehab based on patient barriers.: Yes   Intensive Cardiac Rehabilitation (ICR) [facility] location only OR Traditional Cardiac Rehabilitation (TCR) *If criteria for ICR are not met will enroll in TCR [facility] only): Yes        Discharge Medications   Allergies as of 05/14/2024       Reactions   Jardiance  [empagliflozin ]    Dizziness even with 10 mg daily   Metformin  And Related Diarrhea        Medication List     TAKE these medications    aspirin  EC 81 MG tablet Take 81 mg by mouth daily. Swallow whole.   atorvastatin  40 MG tablet Commonly known as: LIPITOR  Take 1 tablet (40 mg total) by mouth daily.   carvedilol  12.5 MG tablet Commonly known as: COREG  Take 3 tablets (37.5 mg total) by mouth 2 (two) times daily with a meal.   clopidogrel  75 MG tablet Commonly known as: PLAVIX  Take 1 tablet (75 mg total) by mouth daily.   Entresto  97-103 MG Generic drug: sacubitril -valsartan  Take 1 tablet by mouth 2 (two) times daily.   furosemide  40 MG tablet Commonly known as: LASIX  Take 1 tablet (40 mg total) by mouth 2 (two) times daily.   glipiZIDE  5 MG 24 hr tablet Commonly known as: GLUCOTROL  XL Take 1 tablet (5 mg total) by mouth daily  with breakfast.   HumuLIN  70/30 KwikPen (70-30) 100 UNIT/ML KwikPen Generic drug: insulin  isophane & regular human KwikPen Inject 15 Units into the skin 2 (two) times daily.   isosorbide  mononitrate 60 MG 24 hr tablet Commonly known as: IMDUR  Take 1.5 tablets (90 mg total) by mouth daily.   nitroGLYCERIN  0.4 MG SL tablet Commonly known as: NITROSTAT  Place 0.4 mg under the tongue every 5 (five) minutes as needed for chest pain.   Ozempic  (0.25 or 0.5 MG/DOSE) 2 MG/3ML Sopn Generic drug: Semaglutide (0.25 or 0.5MG /DOS) Inject 0.25 mg into the skin once a week.   Repatha  SureClick 140 MG/ML Soaj Generic drug: Evolocumab  Inject 140 mg into the skin every 14 (fourteen) days.   spironolactone  25 MG tablet Commonly known as: ALDACTONE  Take 1 tablet (25 mg total) by mouth daily.   TechLite Plus Pen Needles 32G X 4 MM Misc Generic drug: Insulin  Pen Needle USE TO INJECT INSULIN  UP TO 4 TIMES DAILY AS NEEDED   valACYclovir  500 MG tablet Commonly known as: VALTREX  Take 1 tablet (500 mg total) by mouth 2 (two) times daily. What changed:  when to take this reasons to take  this           Allergies Allergies  Allergen Reactions   Jardiance  [Empagliflozin ]     Dizziness even with 10 mg daily   Metformin  And Related Diarrhea    Outstanding Labs/Studies    Duration of Discharge Encounter   Greater than 30 minutes including physician time.  Signed, Leontine LOISE Salen, PA-C 05/14/2024, 4:54 PM

## 2024-05-15 ENCOUNTER — Encounter (HOSPITAL_COMMUNITY): Payer: Self-pay | Admitting: Cardiovascular Disease

## 2024-05-15 ENCOUNTER — Other Ambulatory Visit (HOSPITAL_COMMUNITY): Payer: Self-pay

## 2024-05-15 ENCOUNTER — Other Ambulatory Visit: Payer: Self-pay

## 2024-05-15 ENCOUNTER — Telehealth: Payer: Self-pay | Admitting: *Deleted

## 2024-05-15 MED ORDER — OZEMPIC (0.25 OR 0.5 MG/DOSE) 2 MG/3ML ~~LOC~~ SOPN
0.2500 mg | PEN_INJECTOR | SUBCUTANEOUS | 1 refills | Status: DC
  Filled 2024-05-15: qty 3, 56d supply, fill #0

## 2024-05-15 NOTE — Telephone Encounter (Signed)
 The patient has been notified of the result and verbalized understanding.  All questions (if any) were answered. Joshua Dalton Seip, CMA 05/15/2024 5:31 PM

## 2024-05-15 NOTE — Telephone Encounter (Signed)
-----   Message from Wilbert Bihari sent at 05/09/2024  7:08 PM EDT ----- Please let patient know that sleep study showed no significant sleep apnea.

## 2024-05-19 ENCOUNTER — Encounter: Payer: Self-pay | Admitting: Emergency Medicine

## 2024-05-19 ENCOUNTER — Other Ambulatory Visit: Payer: Self-pay

## 2024-05-20 ENCOUNTER — Ambulatory Visit: Payer: Self-pay | Admitting: Internal Medicine

## 2024-05-20 ENCOUNTER — Other Ambulatory Visit: Payer: Self-pay | Admitting: Radiology

## 2024-05-22 ENCOUNTER — Other Ambulatory Visit: Payer: Self-pay | Admitting: Radiology

## 2024-05-22 ENCOUNTER — Other Ambulatory Visit (HOSPITAL_COMMUNITY): Payer: Self-pay

## 2024-05-22 DIAGNOSIS — E1165 Type 2 diabetes mellitus with hyperglycemia: Secondary | ICD-10-CM

## 2024-05-22 MED ORDER — SEMAGLUTIDE (1 MG/DOSE) 4 MG/3ML ~~LOC~~ SOPN
1.0000 mg | PEN_INJECTOR | SUBCUTANEOUS | 1 refills | Status: DC
  Filled 2024-05-22 – 2024-06-09 (×2): qty 3, 28d supply, fill #0
  Filled 2024-06-16 – 2024-06-30 (×2): qty 3, 28d supply, fill #1

## 2024-05-29 ENCOUNTER — Encounter (HOSPITAL_COMMUNITY): Payer: Self-pay

## 2024-05-29 ENCOUNTER — Other Ambulatory Visit: Payer: Self-pay

## 2024-05-29 ENCOUNTER — Other Ambulatory Visit: Payer: Self-pay | Admitting: *Deleted

## 2024-05-29 ENCOUNTER — Inpatient Hospital Stay (HOSPITAL_COMMUNITY): Admission: RE | Admit: 2024-05-29 | Source: Ambulatory Visit

## 2024-05-29 DIAGNOSIS — R202 Paresthesia of skin: Secondary | ICD-10-CM

## 2024-05-29 DIAGNOSIS — T148XXA Other injury of unspecified body region, initial encounter: Secondary | ICD-10-CM

## 2024-05-29 DIAGNOSIS — M79601 Pain in right arm: Secondary | ICD-10-CM

## 2024-05-29 DIAGNOSIS — R200 Anesthesia of skin: Secondary | ICD-10-CM

## 2024-05-29 NOTE — Telephone Encounter (Signed)
 Urgent referral placed with vascular surgery. Apt is scheduled for 1:00 PM today 05/29/24. Spoke with pt. Pt was advised of appointment time with vascular surgery.

## 2024-06-01 NOTE — Telephone Encounter (Signed)
 Patient was scheduled for UE doppler 05/29/24 but appointment was cancelled w/note that she brought kids to appointment. She has OV 06/02/24

## 2024-06-02 ENCOUNTER — Other Ambulatory Visit (HOSPITAL_COMMUNITY): Payer: Self-pay

## 2024-06-02 ENCOUNTER — Ambulatory Visit: Attending: Nurse Practitioner | Admitting: Nurse Practitioner

## 2024-06-02 ENCOUNTER — Other Ambulatory Visit: Payer: Self-pay

## 2024-06-02 ENCOUNTER — Encounter: Payer: Self-pay | Admitting: Nurse Practitioner

## 2024-06-02 VITALS — BP 134/85 | HR 91 | Ht 62.0 in | Wt 319.8 lb

## 2024-06-02 DIAGNOSIS — T148XXA Other injury of unspecified body region, initial encounter: Secondary | ICD-10-CM | POA: Diagnosis not present

## 2024-06-02 DIAGNOSIS — R42 Dizziness and giddiness: Secondary | ICD-10-CM | POA: Insufficient documentation

## 2024-06-02 DIAGNOSIS — Z8673 Personal history of transient ischemic attack (TIA), and cerebral infarction without residual deficits: Secondary | ICD-10-CM | POA: Insufficient documentation

## 2024-06-02 DIAGNOSIS — I5032 Chronic diastolic (congestive) heart failure: Secondary | ICD-10-CM | POA: Diagnosis not present

## 2024-06-02 DIAGNOSIS — E785 Hyperlipidemia, unspecified: Secondary | ICD-10-CM | POA: Insufficient documentation

## 2024-06-02 DIAGNOSIS — I10 Essential (primary) hypertension: Secondary | ICD-10-CM | POA: Diagnosis not present

## 2024-06-02 DIAGNOSIS — E1165 Type 2 diabetes mellitus with hyperglycemia: Secondary | ICD-10-CM | POA: Diagnosis not present

## 2024-06-02 DIAGNOSIS — N1832 Chronic kidney disease, stage 3b: Secondary | ICD-10-CM | POA: Diagnosis not present

## 2024-06-02 DIAGNOSIS — I5020 Unspecified systolic (congestive) heart failure: Secondary | ICD-10-CM

## 2024-06-02 DIAGNOSIS — I25118 Atherosclerotic heart disease of native coronary artery with other forms of angina pectoris: Secondary | ICD-10-CM | POA: Insufficient documentation

## 2024-06-02 MED ORDER — ISOSORBIDE MONONITRATE ER 120 MG PO TB24
120.0000 mg | ORAL_TABLET | Freq: Every day | ORAL | 3 refills | Status: AC
  Filled 2024-06-02: qty 90, 90d supply, fill #0
  Filled 2024-09-15: qty 90, 90d supply, fill #1

## 2024-06-02 NOTE — Progress Notes (Signed)
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 her fingers has improved. She has noted some intermittently low BP, mild dizziness.  She notes occasional palpitations that occur when she lays down to sleep at night, she denies any presyncope or syncope.   Home Medications    Current Outpatient Medications  Medication Sig Dispense Refill   aspirin  EC 81 MG tablet Take 81 mg by mouth daily. Swallow whole.     atorvastatin  (LIPITOR ) 40 MG tablet Take 1 tablet (40 mg total) by mouth daily. 90 tablet 3   carvedilol  (COREG ) 12.5 MG tablet Take 3 tablets (37.5 mg total) by mouth 2 (two) times daily with a meal. 180 tablet 3   clopidogrel  (PLAVIX ) 75 MG tablet Take 1 tablet (  75 mg total) by mouth daily. 90 tablet 3   Evolocumab  (REPATHA  SURECLICK) 140 MG/ML SOAJ Inject 140 mg into the skin every 14 (fourteen) days. 6 mL 3   furosemide  (LASIX ) 40 MG tablet Take 1 tablet (40 mg total) by mouth 2 (two) times daily. 180 tablet 3   glipiZIDE  (GLUCOTROL  XL) 5 MG 24 hr tablet Take 1 tablet (5 mg total) by mouth daily with breakfast. 90 tablet 1    insulin  isophane & regular human KwikPen (HUMULIN  70/30 KWIKPEN) (70-30) 100 UNIT/ML KwikPen Inject 15 Units into the skin 2 (two) times daily. 3 mL 5   Insulin  Pen Needle (BD PEN NEEDLE NANO 2ND GEN) 32G X 4 MM MISC USE TO INJECT INSULIN  UP TO 4 TIMES DAILY AS NEEDED 100 each 0   isosorbide  mononitrate (IMDUR ) 120 MG 24 hr tablet Take 1 tablet (120 mg total) by mouth daily. 90 tablet 3   nitroGLYCERIN  (NITROSTAT ) 0.4 MG SL tablet Place 0.4 mg under the tongue every 5 (five) minutes as needed for chest pain.     sacubitril -valsartan  (ENTRESTO ) 97-103 MG Take 1 tablet by mouth 2 (two) times daily. 180 tablet 3   Semaglutide , 1 MG/DOSE, 4 MG/3ML SOPN Inject 1 mg into the skin once a week. 3 mL 1   Semaglutide ,0.25 or 0.5MG /DOS, (OZEMPIC , 0.25 OR 0.5 MG/DOSE,) 2 MG/3ML SOPN Inject 0.25 mg into the skin once a week. 3 mL 1   spironolactone  (ALDACTONE ) 25 MG tablet Take 1 tablet (25 mg total) by mouth daily. (Patient taking differently: Take 12.5 mg by mouth daily.) 90 tablet 3   valACYclovir  (VALTREX ) 500 MG tablet Take 1 tablet (500 mg total) by mouth 2 (two) times daily. 180 tablet 1   No current facility-administered medications for this visit.     Review of Systems    She denies pnd, orthopnea, n, v,, syncope, edema, weight gain, or early satiety. All other systems reviewed and are otherwise negative except as noted above.    Cardiac Rehabilitation Eligibility Assessment  The patient is NOT ready to start cardiac rehabilitation due to: The patient is experiencing unstable angina.    Physical Exam    VS:  BP 134/85 (BP Location: Left Arm, Patient Position: Sitting, Cuff Size: Large)   Pulse 91   Ht 5' 2 (1.575 m)   Wt (!) 319 lb 12.8 oz (145.1 kg)   SpO2 99%   BMI 58.49 kg/m   GEN: Well nourished, well developed, in no acute distress. HEENT: normal. Neck: Supple, no JVD, carotid bruits, or masses. Cardiac: RRR, no murmurs, rubs, or gallops. No clubbing, cyanosis, edema.   Nonpalpable right radial pulse, present with Doppler, right radial hematoma, no bruit, bleeding.  L Radials 2+/DP/PT 2+ and equal bilaterally.  Respiratory:  Respirations regular and unlabored, clear to auscultation bilaterally. GI: Soft, nontender, nondistended, BS + x 4. MS: no deformity or atrophy. Skin: warm and dry, no rash. Neuro:  Strength and sensation are intact. Psych: Normal affect.  Accessory Clinical Findings    ECG personally reviewed by me today - EKG Interpretation Date/Time:  Tuesday June 02 2024 07:59:44 EDT Ventricular Rate:  91 PR Interval:  146 QRS Duration:  102 QT Interval:  396 QTC Calculation: 487 R Axis:   49  Text Interpretation: Normal sinus rhythm Septal infarct , age undetermined When compared with ECG of 14-May-2024 11:24, No significant change was found Confirmed by Daneen Perkins ([id number]) on 06/02/2024 8:13:29 AM  - no acute changes.   Lab Results  Component Value Date   WBC 9.2 05/08/2024   HGB 10.1 (L) 05/08/2024   HCT 31.6 (L) 05/08/2024   MCV 91 05/08/2024   PLT 363 05/08/2024   Lab Results  Component Value Date   CREATININE 1.44 (H) 05/08/2024   BUN 27 (H) 05/08/2024   NA 139 05/08/2024   K 3.8 05/08/2024   CL 99 05/08/2024   CO2 22 05/08/2024   Lab Results  Component Value Date   ALT 9 05/08/2024   AST 13 05/08/2024   ALKPHOS 121 05/08/2024   BILITOT 0.3 05/08/2024   Lab Results  Component Value Date   CHOL 66 (L) 05/08/2024   HDL 33 (L) 05/08/2024   LDLCALC 14 05/08/2024   TRIG 94 05/08/2024   CHOLHDL 2.0 05/08/2024    Lab Results  Component Value Date   HGBA1C 9.7 (H) 02/06/2024    Assessment & Plan    1. CAD: CAD s/p NSTEMI, DESx 4 (p-mLAD, Ramus, pRCA) in 2019. Myoview  in January 2021 showed EF 36%, intermediate risk study with apical scar and moderate anterior apical ischemia with this scarring of anterior septum. Medical management was advised.  She was hospitalized in March 2025 in the setting of NSTEMI. Echo  showed EF 45%, mildly decreased LV function, G1 DD, elevated left atrial pressure, normal RV systolic function, mild mitral valve regurgitation, no significant change from prior echo Ischemic evaluation was deferred.  She was hospitalized again in June 2025 in the setting of chest pain.  Cardiac catheterization showed severe multivessel CAD, widely patent LAD and RCA stents, focal stenosis in the ramus branch as well as the mid LAD PCI was deferred, she was referred to CT surgery who felt she was a poor candidate for surgery in the setting of morbid obesity, uncontrolled diabetes. She underwent staged PCI, DES-mid LAD, PTCA-RI (ISR). It was noted that should she have persistent angina, she may be considered a candidate for CTO-PCI-RCA (this would need to be reviewed by Dr. Swaz[place]and). She continues to note symptoms of intermittent chest discomfort both at rest and with activity, as well as mild dyspnea on exertion.  She has taken nitroglycerin  several times since her procedure.  Reviewed with Dr. Wonda today, who recommends titration of antianginal therapy.  Will increase Imdur  to 120 mg daily.  Will decrease spironolactone  in the setting of borderline BP, dizziness and to allow for titration of antianginal therapy.  Continue aspirin , Plavix , carvedilol , Entresto , spironolactone  as above, Lasix , Imdur , Lipitor , and Repatha .  2. Right radial hematoma: She continues to note pain to her right wrist at her radial cath site, the numbness and tingling to her fingers has improved.  I was unable to palpate a radial pulse, she did have a palpable ulnar pulse.  Pulse was present by Doppler.  No auscultated bruit. She does appear to have limited flow distal to radial artery with sluggish Allen's test. Examined by Dr. Wonda, who agrees patient should have right upper extremity duplex. Will schedule for tomorrow.  Reviewed ED precautions.       3.  Heart failure with improved EF: Diagnosed in 2019 in the setting of NSTEMI, EF  35 to 40% at the time. Echo in 11/2023 showed EF 45%, mildly decreased LV function, G1 DD, elevated left atrial pressure, normal RV systolic function, mild mitral valve regurgitation, no significant change from prior echo.  Limited echo in 02/2024 showed EF 50 to 55%, low normal LV function, no RWMA, normal RV systolic function, no significant valvular abnormalities. Euvolemic  and well compensated on exam.  She did not tolerate Jardiance  due to dizziness.  Will decrease spironolactone  as above to allow for titration of antianginal therapy.  Continue carvedilol , Entresto , spironolactone  as above, and Lasix .    4. Hypertension/orthostatic dizziness: BP well controlled in office today, she has noted some low BP readings at home with SBP in the 90s with associated dizziness.  Will decrease spironolactone  as above.  Continue to monitor symptoms, otherwise, continue current antihypertensive regimen.   5. Hyperlipidemia: LDL was 14 in 05/2024.  Continue Repatha , Lipitor .  6. H/o CVA: Hospitalized 08/2021 in the setting of CVA. Bubble study was negative for PFO. TEE without cardiac source of embolism.  30-day event monitor showed normal sinus rhythm, no signs of atrial fibrillation or other arrhythmia.  Followed by neurology.  Continue aspirin , Plavix , Lipitor .  7. Type 2 diabetes/obesity: A1c was 9.7 in 02/2024.  Following with endocrinology.   8.  CKD stage IIIb: Creatinine was 1.44 in 05/2024.  9. Possible OSA: Sleep study was negative for sleep apnea.    10. Disposition: Follow-up in 2 months, sooner if needed.      [name] JAYSON Braver, NP 06/02/2024, 5:33 PM

## 2024-06-02 NOTE — Patient Instructions (Addendum)
 Medication Instructions:  Decrease Spironolactone  12.5 mg daily Increase Imdur  120 mg daily   *If you need a refill on your cardiac medications before your next appointment, please call your pharmacy*  Lab Work: NONE ordered at this time of appointment   Testing/Procedures: UE Arterial Duplex   Follow-Up: At [facility], you and your health needs are our priority.  As part of our continuing mission to provide you with exceptional heart care, our providers are all part of one team.  This team includes your primary Cardiologist (physician) and Advanced Practice Providers or APPs (Physician Assistants and Nurse Practitioners) who all work together to provide you with the care you need, when you need it.  Your next appointment:   2 month(s)  Provider:   [name] JAYSON Maxcy, MD    We recommend signing up for the patient portal called MyChart.  Sign up information is provided on this After Visit Summary.  MyChart is used to connect with patients for Virtual Visits (Telemedicine).  Patients are able to view lab/test results, encounter notes, upcoming appointments, etc.  Non-urgent messages can be sent to your provider as well.   To learn more about what you can do with MyChart, go to [email or web address].

## 2024-06-03 ENCOUNTER — Ambulatory Visit (HOSPITAL_COMMUNITY)
Admission: RE | Admit: 2024-06-03 | Discharge: 2024-06-03 | Disposition: A | Discharge status: Home / Self Care | Source: Ambulatory Visit | Attending: Vascular Surgery | Admitting: Vascular Surgery

## 2024-06-03 ENCOUNTER — Telehealth: Payer: Self-pay | Admitting: Internal Medicine

## 2024-06-03 DIAGNOSIS — I97630 Postprocedural hematoma of a circulatory system organ or structure following a cardiac catheterization: Secondary | ICD-10-CM | POA: Diagnosis not present

## 2024-06-03 DIAGNOSIS — T148XXA Other injury of unspecified body region, initial encounter: Secondary | ICD-10-CM | POA: Diagnosis present

## 2024-06-03 NOTE — Telephone Encounter (Signed)
 Vascular lab calling with results:  There is no evidence of hematoma or DVT. 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   Forwarding to provider for review/advisement.  Does pt need sooner appt than f/u on 12/9 w/ [name]? Please forward response to triage

## 2024-06-03 NOTE — Telephone Encounter (Signed)
 Tech calling to report urgent results.

## 2024-06-04 NOTE — Telephone Encounter (Signed)
 Call to patient to discuss treatment plan for radial artery occlusion. Patient verbalizes knowledge of occlusion and of appointments that are set up. Discussed symptoms to look out for such as hand numbness/pain, arm swelling. Patient verbalizes understanding and agrees to plan.

## 2024-06-04 NOTE — Telephone Encounter (Signed)
 See previous encounter. Patient is aware of results

## 2024-06-09 ENCOUNTER — Other Ambulatory Visit (HOSPITAL_COMMUNITY): Payer: Self-pay

## 2024-06-09 ENCOUNTER — Other Ambulatory Visit: Payer: Self-pay

## 2024-06-14 ENCOUNTER — Emergency Department (HOSPITAL_BASED_OUTPATIENT_CLINIC_OR_DEPARTMENT_OTHER): Admission: EM | Admit: 2024-06-14 | Discharge: 2024-06-14 | Disposition: A | Discharge status: Home / Self Care

## 2024-06-14 ENCOUNTER — Other Ambulatory Visit: Payer: Self-pay | Admitting: Cardiology

## 2024-06-14 ENCOUNTER — Other Ambulatory Visit: Payer: Self-pay

## 2024-06-14 DIAGNOSIS — Z7984 Long term (current) use of oral hypoglycemic drugs: Secondary | ICD-10-CM | POA: Insufficient documentation

## 2024-06-14 DIAGNOSIS — I2510 Atherosclerotic heart disease of native coronary artery without angina pectoris: Secondary | ICD-10-CM | POA: Insufficient documentation

## 2024-06-14 DIAGNOSIS — I130 Hypertensive heart and chronic kidney disease with heart failure and stage 1 through stage 4 chronic kidney disease, or unspecified chronic kidney disease: Secondary | ICD-10-CM | POA: Insufficient documentation

## 2024-06-14 DIAGNOSIS — Z7982 Long term (current) use of aspirin: Secondary | ICD-10-CM | POA: Insufficient documentation

## 2024-06-14 DIAGNOSIS — M79631 Pain in right forearm: Secondary | ICD-10-CM | POA: Diagnosis not present

## 2024-06-14 DIAGNOSIS — Z794 Long term (current) use of insulin: Secondary | ICD-10-CM | POA: Insufficient documentation

## 2024-06-14 DIAGNOSIS — R200 Anesthesia of skin: Secondary | ICD-10-CM | POA: Diagnosis not present

## 2024-06-14 DIAGNOSIS — Z7902 Long term (current) use of antithrombotics/antiplatelets: Secondary | ICD-10-CM | POA: Insufficient documentation

## 2024-06-14 DIAGNOSIS — E1122 Type 2 diabetes mellitus with diabetic chronic kidney disease: Secondary | ICD-10-CM | POA: Insufficient documentation

## 2024-06-14 DIAGNOSIS — N189 Chronic kidney disease, unspecified: Secondary | ICD-10-CM | POA: Diagnosis not present

## 2024-06-14 DIAGNOSIS — Z79899 Other long term (current) drug therapy: Secondary | ICD-10-CM | POA: Diagnosis not present

## 2024-06-14 DIAGNOSIS — I509 Heart failure, unspecified: Secondary | ICD-10-CM | POA: Insufficient documentation

## 2024-06-14 DIAGNOSIS — T148XXA Other injury of unspecified body region, initial encounter: Secondary | ICD-10-CM

## 2024-06-14 NOTE — Progress Notes (Signed)
 Patient was seen and [facility] ED on 10/12. 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 Not a limb loss threat per [facility] ED.   Per Dr. Tyrone request, ordered repeat duplex of right upper extremity.  Messaged schedulers to arrange. Patient has [facility] F/U with Dr. Pearline 06/26/2024   Rollo FABIENE Louder, PA-C 06/14/2024 10:48 AM

## 2024-06-14 NOTE — Discharge Instructions (Signed)
 You were seen here today for your right forearm pain.  It is possible that the occlusion that was noted on your previous ultrasound has worsened and could be causing your increased pain.  Unfortunately, we are unable to perform the arterial ultrasound here today to check.  Please call your cardiologist office first thing tomorrow morning to schedule an outpatient arterial ultrasound of your right forearm within the next 3 days for further evaluation.  You may take up to 1000mg  of tylenol  every 6 hours as needed for pain.  Do not take more then 4g per day.  You may apply heating packs to your right forearm to help with pain.  Please return to the ER for any numbness in your hand, have feeling that your pain is cold, any severe pain, any other new or concerning symptoms

## 2024-06-14 NOTE — ED Triage Notes (Signed)
 Pt caox4 ambulatory c/o pain in R forearm with numbness in fingers reporting she has arterial occlusion in R arm that she has been seen for but these s/s they told her to look out for just started a few days ago.

## 2024-06-14 NOTE — ED Provider Notes (Signed)
 [place] EMERGENCY DEPARTMENT AT [facility] Provider Note   CSN: [id number] Arrival date & time: 06/14/24  9065     Patient presents with: Extremity Pain   Lauren Delgado is a 38 y.o. female with history of NSTEMI, CAD s/p PCI, type 2 diabetes, obesity, hypertension, heart failure, CVA, CKD, presents with concern for pain in the right forearm that has been ongoing since her coronary stent placement on 05/14/2024.  She reports the pain seems to be getting worse.  She also reports a numbness sensation into her right thumb.  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  She was told to continue her Plavix  for management.    HPI     Prior to Admission medications   Medication Sig Start Date End Date Taking? Authorizing Provider  aspirin  EC 81 MG tablet Take 81 mg by mouth daily. Swallow whole.    [provider]  atorvastatin  (LIPITOR ) 40 MG tablet Take 1 tablet (40 mg total) by mouth daily. 05/11/24 08/10/24  Mona [name] [name], MD  carvedilol  (COREG ) 12.5 MG tablet Take 3 tablets (37.5 mg total) by mouth 2 (two) times daily with a meal. 11/25/23   [name], [name] HERO, FNP  clopidogrel  (PLAVIX ) 75 MG tablet Take 1 tablet (75 mg total) by mouth daily. 11/25/23   [name], [name] HERO, FNP  Evolocumab  (REPATHA  SURECLICK) 140 MG/ML SOAJ Inject 140 mg into the skin every 14 (fourteen) days. 03/09/24   [name], [name] [name], MD  furosemide  (LASIX ) 40 MG tablet Take 1 tablet (40 mg total) by mouth 2 (two) times daily. 11/25/23   Glena [name] HERO, FNP  glipiZIDE  (GLUCOTROL  XL) 5 MG 24 hr tablet Take 1 tablet (5 mg total) by mouth daily with breakfast. 03/19/24   Sagardia, Miguel Jose, MD  insulin  isophane & regular human KwikPen (HUMULIN  70/30 KWIKPEN) (70-30) 100 UNIT/ML KwikPen Inject 15 Units into the skin 2 (two) times daily. 03/19/24    Purcell [name], MD  Insulin  Pen Needle (BD PEN NEEDLE NANO 2ND GEN) 32G X 4 MM MISC USE TO INJECT INSULIN  UP TO 4 TIMES DAILY AS NEEDED 11/22/23   Purcell [name], MD  isosorbide  mononitrate (IMDUR ) 120 MG 24 hr tablet Take 1 tablet (120 mg total) by mouth daily. 06/02/24   Braver [name] [name], NP  nitroGLYCERIN  (NITROSTAT ) 0.4 MG SL tablet Place 0.4 mg under the tongue every 5 (five) minutes as needed for chest pain.    [provider]  sacubitril -valsartan  (ENTRESTO ) 97-103 MG Take 1 tablet by mouth 2 (two) times daily. 11/25/23   [name], [name] HERO, FNP  Semaglutide , 1 MG/DOSE, 4 MG/3ML SOPN Inject 1 mg into the skin once a week. 05/22/24   Sagardia, Miguel Jose, MD  Semaglutide ,0.25 or 0.5MG /DOS, (OZEMPIC , 0.25 OR 0.5 MG/DOSE,) 2 MG/3ML SOPN Inject 0.25 mg into the skin once a week. 05/15/24   Purcell [name], MD  spironolactone  (ALDACTONE ) 25 MG tablet Take 1 tablet (25 mg total) by mouth daily. Patient taking differently: Take 12.5 mg by mouth daily. 11/25/23   [name], [name] HERO, FNP  valACYclovir  (VALTREX ) 500 MG tablet Take 1 tablet (500 mg total) by mouth 2 (two) times daily. 12/02/23   Sagardia, Miguel Jose, MD    Allergies: Jardiance  Parry ] and Metformin  and related    Review of Systems  Musculoskeletal:        Right  forearm pain    Updated Vital Signs BP 135/77   Pulse 95   LMP 05/27/2024 (Approximate)   SpO2 100%   Physical Exam Vitals and nursing note reviewed.  Constitutional:      Appearance: Normal appearance.  HENT:     Head: Atraumatic.  Cardiovascular:     Rate and Rhythm: Normal rate and regular rhythm.     Comments: 1+ radial pulse compared to 2+ left radial pulse  Cap refill in the digits of the right hand less than 2 seconds Pulmonary:     Effort: Pulmonary effort is normal.  Musculoskeletal:       Arms:     Comments: Right upper extremity:  General No erythema, edema, contusions, open wounds   Palpation Tender to  palpation of the anterior lateral aspect of the right forearm soft tissues.  Also reports tenderness along the extensor pollicis brevis tendon leading up to the thumb. No tenderness to the radius or ulna Nontender of the carpal bones diffusely, no snuffbox TTP Nontender over the 1st through 5th metacarpals, 1st through 5th phalanges  ROM Full flexion extension at the wrist Full flexion extension at the 1st through 5th MCPs, PIPs, DIPs  Special tests Reports significant pain with Finkelstein's maneuver which does replicate her pain.  Sensation: Sensation intact and equal throughout the 1st-5th digits of the right hand   Neurological:     General: No focal deficit present.     Mental Status: She is alert.     Comments: 5/5 strength with ulnar, radian, median nerve strength testing in the right hand  Psychiatric:        Mood and Affect: Mood normal.        Behavior: Behavior normal.     (all labs ordered are listed, but only abnormal results are displayed) Labs Reviewed - No data to display  EKG: None  Radiology: No results found.   Procedures   Medications Ordered in the ED - No data to display  Clinical Course as of 06/14/24 1209  Sun Jun 14, 2024  1041 Consulted with Cardiology Dr. Francyne, he recommends outpatient follow-up with their office and they will order repeat arterial ultrasound of her right upper extremity. [AF]    Clinical Course User Index [AF] Veta Palma, PA-C                                 Medical Decision Making    Differential diagnosis includes but is not limited to DVT, arterial occlusion, radiculopathy, de Quervain's tenosynovitis, muscle strain, fracture, dislocation  ED Course:  Upon initial evaluation, patient is well-appearing, no acute distress.  Stable vitals.  Reporting pain to the right forearm.  Denies any known injury, no bony tenderness to palpation of the radius, ulna, carpal bones diffusely, 1st through 5th metacarpals  or phalanges, forage of motion of the right upper extremity, I have low concern for acute fracture or dislocation.  Do not feel she needs any x-ray imaging at this time.  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  This does correspond with where patient is having her pain today.  She reports that this pain has been ongoing since her stent placement on 05/14/2024.  She has been maintained on Plavix  for known clot, and reports compliance with this medication.  I do not have any concern for acute occlusion or loss of blood flow to the right  upper extremity today as she does have a 1+ radial pulse palpable of the right upper extremity, and cap refill of the right hand 1st through 5th digits is under 2 seconds.  She has intact sensation to all digits of the right hand, no noted weakness in the radian, ulnar, or median nerve distribution, lower concern for radiculopathy. Compartments soft, neurovascularly intact, no concern for compartment syndrome. She also has some tenderness over the extensor pollicis brevis and has pain with Finkelstein's maneuver, also question possible de Quervain's as the cause of her pain. Unfortunately, we do not have the ability to perform an arterial ultrasound here at [facility] today.  I consulted with cardiology and Dr. Francyne who recommends having patient contact their office to schedule an outpatient arterial ultrasound to reevaluate this area of concern.  He does not recommend any further workup here in the emergency room. This plan of care was discussed with patient and she is in agreement with plan.  She understands she will need to call her cardiologist office tomorrow morning to schedule this ultrasound within the next 3 days.  Patient understands she needs to continue on her home Plavix .  I also private messaged her cardiologist [name] Braver, did notify her of the plan.  Medications Given: None   Impression: Right forearm pain  Disposition:   The patient was discharged home with instructions to call the office of cardiologist NP [name] Braver to schedule a repeat arterial ultrasound of her right forearm for further evaluation of the occlusion noted on her previous scan.  She understands this needs to be performed within the next 3 days for further evaluation.  Continue home Plavix . Return precautions given and patient verbalized understanding.    Record Review: External records from outside source obtained and reviewed including   Vascular arterial ultrasound of the right upper extremity from 06/03/24:  Right: There is no evidence of hematoma or DVT. 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      This chart was dictated using voice recognition software, Dragon. Despite the best efforts of this provider to proofread and correct errors, errors may still occur which can change documentation meaning.       Final diagnoses:  Right forearm pain    ED Discharge Orders     None          Veta Palma, DEVONNA 06/14/24 1209    Ula Prentice SAUNDERS, MD 06/14/24 1455

## 2024-06-16 ENCOUNTER — Other Ambulatory Visit: Payer: Self-pay

## 2024-06-16 ENCOUNTER — Other Ambulatory Visit (HOSPITAL_COMMUNITY): Payer: Self-pay

## 2024-06-17 ENCOUNTER — Encounter (HOSPITAL_COMMUNITY): Payer: Self-pay

## 2024-06-17 ENCOUNTER — Ambulatory Visit (INDEPENDENT_AMBULATORY_CARE_PROVIDER_SITE_OTHER): Admitting: Vascular Surgery

## 2024-06-17 ENCOUNTER — Ambulatory Visit (HOSPITAL_COMMUNITY)
Admission: RE | Admit: 2024-06-17 | Discharge: 2024-06-17 | Disposition: A | Discharge status: Home / Self Care | Source: Ambulatory Visit | Attending: Internal Medicine | Admitting: Internal Medicine

## 2024-06-17 ENCOUNTER — Other Ambulatory Visit (HOSPITAL_COMMUNITY)

## 2024-06-17 ENCOUNTER — Encounter: Payer: Self-pay | Admitting: Vascular Surgery

## 2024-06-17 ENCOUNTER — Other Ambulatory Visit: Payer: Self-pay

## 2024-06-17 VITALS — BP 137/74 | HR 94 | Temp 98.2°F | Resp 20 | Ht 62.0 in | Wt 321.0 lb

## 2024-06-17 DIAGNOSIS — I70208 Unspecified atherosclerosis of native arteries of extremities, other extremity: Secondary | ICD-10-CM | POA: Diagnosis not present

## 2024-06-17 DIAGNOSIS — T148XXA Other injury of unspecified body region, initial encounter: Secondary | ICD-10-CM | POA: Diagnosis present

## 2024-06-17 DIAGNOSIS — M79601 Pain in right arm: Secondary | ICD-10-CM
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
## 2024-06-18 ENCOUNTER — Encounter (HOSPITAL_COMMUNITY): Payer: Self-pay | Admitting: Vascular Surgery

## 2024-06-18 ENCOUNTER — Other Ambulatory Visit: Payer: Self-pay

## 2024-06-18 NOTE — Progress Notes (Signed)
 Anesthesia Chart Review: Same day workup  38 year old female follows with cardiology for history of CVA 08/2021, CAD s/p NSTEMI with DES x 4 (p-mLAD, Ramus, pRCA) in 2019, NSTEMI 11/2023 with no intervention, staged PCI with DES to mid LAD and PTCA-RI (ISR) 05/14/2024, heart failure with improved EF (EF 35 to 40% at time of NSTEMI in 2019, most recent echo 02/2024 showed EF 50 to 55%, normal RV, no significant valvular abnormalities).  Last seen in follow-up by [name] Braver, NP on 06/02/2024.  Discussed that she continued to have intermittent chest discomfort both at rest and with activity, as well as mild DOE.  Reported using nitroglycerin  several times since her most recent procedure.  Dr. Wonda recommended titration of antianginal therapy, Imdur  was increased to 120 mg daily.  Spironolactone  was decreased in the setting of borderline BP.  She was otherwise continued on aspirin , Plavix , carvedilol , Entresto , Lasix , Lipitor , Repatha .  She was also noted to have persistent pain in her right wrist at the site of her radial cath.  Duplex was ordered which showed no evidence of hematoma or DVT.  There was evidence of acute occlusion of the mid forearm radial artery and retrograde flow in the distal forearm radial artery.  She was referred to vascular surgery and seen by Dr. Gretta on 06/17/2024. Right radial thrombectomy recommended.  Other pertinent history includes poorly controlled IDDM 2 (A1c 9.7 on 02/06/2024), CKD 3B, obesity BMI 58  Patient will need day of surgery labs and evaluation.  EKG 06/02/2024: Normal sinus rhythm.  Rate 91. Septal infarct , age undetermined  TTE 02/07/2024: 1. Posterior lateral hypokinesis . Left ventricular ejection fraction, by  estimation, is 50 to 55%. The left ventricle has low normal function. The  left ventricle has no regional wall motion abnormalities.   2. Right ventricular systolic function is normal. The right ventricular  size is normal.   3. The mitral valve is  abnormal. No evidence of mitral valve  regurgitation. No evidence of mitral stenosis.   4. The aortic valve is tricuspid. Aortic valve regurgitation is not  visualized. No aortic stenosis is present.   5. The inferior vena cava is normal in size with greater than 50%  respiratory variability, suggesting right atrial pressure of 3 mmHg.   Cath/PCI 02/06/2024: Conclusions: Severe multivessel CAD, as detailed below, including 70% mid LAD (RFR 0.81), 40% ostial/proximal LCx, and 50% proximal RCA stenoses, as well as chronic total occlusions of proximal/mid LCx and mid RCA. Widely patent LAD and RCA stents. Patent overlapping ramus intermedius stents with focal in-stent restenosis of up to 70-80% (RFR 0.60). Moderately reduced left ventricular systolic function with mid anterior/anterolateral akinesis (LVEF 35-45%). Normal left ventricular filling pressure (LVEDP 10 mmHg).   Recommendations: Given recurrent severe three-vessel CAD, reduced LVEF, and history of diabetes mellitus, will obtain cardiac surgery consultation for CABG and review the patient's images at tomorrow's HeartTeam meeting. Hold clopidogrel  pending cardiac surgery evaluation. Resume heparin  infusion 2 hours after TR band has been removed. Maintain net even fluid balance. Continue aggressive secondary prevention of CAD and antianginal therapy. Escalate goal-directed medical therapy for HFmrEF, as tolerated.       Lynwood Geofm RIGGERS [facility] Short Stay Center/Anesthesiology Phone [phone] 06/18/2024 3:37 PM

## 2024-06-18 NOTE — Progress Notes (Signed)
 SDW CALL  Patient was given pre-op instructions over the phone. The opportunity was given for the patient to ask questions. No further questions asked. Patient verbalized understanding of instructions given.   PCP - [facility] Sagardia,MD Cardiologist - [name] [name],MD  PPM/ICD - denies Device Orders -  Rep Notified -   Chest x-ray - 02/05/24 EKG - 06/02/24 Stress Test - 09/30/19 ECHO - 02/07/24 Cardiac Cath - 05/14/24  Sleep Study - 05/07/24;negative for OSA CPAP - no  Fasting Blood Sugar - pt does not have a meter and does not check her blood sugar. Instructed her to hold Glipizide (Glucotrol ) and Humulin  70/30 insulin  morning of surgery. Evening of 10/16 she is to take 70% of her Humulin  70/30 insulin  which is 14 units. She is holding Semaglutide  prior to surgery. Her last dose was 10/14. She verbally acknowledged these instructions.    Blood Thinner Instructions: continue Aspirin  and Plavix  per Dr. Gretta Aspirin  Instructions:see above  ERAS Protcol -NPO PRE-SURGERY Ensure or G2- no  COVID TEST- na   Anesthesia review: yes-Hx-NSTEMI,CHF,CAD,DM,cardiac stent intervention (/11/25. Pt reports taking nitroglycerin  two weeks ago for chest pain. Last dose of Semaglutide  was October 14.    Patient denies shortness of breath, fever, cough and chest pain over the phone call    Special instructions:    Oral Hygiene is also important to reduce your risk of infection.  Remember - BRUSH YOUR TEETH THE MORNING OF SURGERY WITH YOUR REGULAR TOOTHPASTE

## 2024-06-18 NOTE — Anesthesia Preprocedure Evaluation (Addendum)
 Anesthesia Evaluation  Patient identified by MRN, date of birth, ID band Patient awake    Reviewed: Allergy & Precautions, NPO status , Patient's Chart, lab work & pertinent test results, reviewed documented beta blocker date and time   Airway Mallampati: III  TM Distance: >3 FB Neck ROM: Full    Dental no notable dental hx. (+) Teeth Intact, Dental Advisory Given   Pulmonary neg pulmonary ROS, Patient abstained from smoking.   Pulmonary exam normal breath sounds clear to auscultation       Cardiovascular hypertension, Pt. on home beta blockers and Pt. on medications + angina  + CAD, + Past MI, + Cardiac Stents and +CHF  Normal cardiovascular exam Rhythm:Regular Rate:Normal  TTE 2025 1. Posterior lateral hypokinesis . Left ventricular ejection fraction, by  estimation, is 50 to 55%. The left ventricle has low normal function. The  left ventricle has no regional wall motion abnormalities.   2. Right ventricular systolic function is normal. The right ventricular  size is normal.   3. The mitral valve is abnormal. No evidence of mitral valve  regurgitation. No evidence of mitral stenosis.   4. The aortic valve is tricuspid. Aortic valve regurgitation is not  visualized. No aortic stenosis is present.   5. The inferior vena cava is normal in size with greater than 50%  respiratory variability, suggesting right atrial pressure of 3 mmHg.   Cath 05/14/24 1.  Chronic total occlusions of the RCA and left circumflex unchanged from previous studies 2.  Severe stenosis of the mid LAD treated successfully with a 3.0 x 16 mm Synergy DES 3.  Severe in-stent restenosis in a highly angulated portion of the ramus intermedius treated successfully with a 2.75 mm score flex balloon reducing 80% stenosis to 20% 4.  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)  Renal/GU Renal disease  negative genitourinary   Musculoskeletal negative musculoskeletal ROS (+)    Abdominal   Peds  Hematology  (+) Blood dyscrasia (plavix )   Anesthesia Other Findings  follows with cardiology for history of CVA 08/2021, CAD s/p NSTEMI with DES x 4 (p-mLAD, Ramus, pRCA) in 2019, NSTEMI 11/2023 with no intervention, staged PCI with DES to mid LAD and PTCA-RI (ISR) 05/14/2024, heart failure with improved EF (EF 35 to 40% at time of NSTEMI in 2019, most recent echo 02/2024 showed EF 50 to 55%, normal RV, no significant valvular abnormalities)  Reproductive/Obstetrics                              Anesthesia Physical Anesthesia Plan  ASA: 4  Anesthesia Plan: General   Post-op Pain Management: Tylenol  PO (pre-op)*   Induction: Intravenous  PONV Risk Score and Plan: 3 and Midazolam , Dexamethasone and Ondansetron   Airway Management Planned: Oral ETT  Additional Equipment:   Intra-op Plan:   Post-operative Plan: Extubation in OR  Informed Consent: I have reviewed the patients History and Physical, chart, labs and discussed the procedure including the risks, benefits and alternatives for the proposed anesthesia with the patient or authorized representative who has indicated his/her understanding and acceptance.     Dental advisory given  Plan Discussed with: CRNA  Anesthesia Plan Comments: (PAT note by Lynwood Hope, PA-C:  38 year old female follows with cardiology for history  of CVA 08/2021, CAD s/p NSTEMI with DES x 4 (p-mLAD, Ramus, pRCA) in 2019, NSTEMI 11/2023 with no intervention, staged PCI with DES to mid LAD and PTCA-RI (ISR) 05/14/2024, heart failure with improved EF (EF 35 to 40% at time of NSTEMI in 2019, most recent echo 02/2024 showed EF 50 to 55%, normal RV, no significant valvular abnormalities).  Last seen in follow-up by [name] Braver, NP on  06/02/2024.  Discussed that she continued to have intermittent chest discomfort both at rest and with activity, as well as mild DOE.  Reported using nitroglycerin  several times since her most recent procedure.  Dr. Wonda recommended titration of antianginal therapy, Imdur  was increased to 120 mg daily.  Spironolactone  was decreased in the setting of borderline BP.  She was otherwise continued on aspirin , Plavix , carvedilol , Entresto , Lasix , Lipitor , Repatha .  She was also noted to have persistent pain in her right wrist at the site of her radial cath.  Duplex was ordered which showed no evidence of hematoma or DVT.  There was evidence of acute occlusion of the mid forearm radial artery and retrograde flow in the distal forearm radial artery.  She was referred to vascular surgery and seen by Dr. Gretta on 06/17/2024. Right radial thrombectomy recommended.  Other pertinent history includes poorly controlled IDDM 2 (A1c 9.7 on 02/06/2024), CKD 3B, obesity BMI 58  Patient will need day of surgery labs and evaluation.  EKG 06/02/2024: Normal sinus rhythm.  Rate 91. Septal infarct , age undetermined  TTE 02/07/2024: 1. Posterior lateral hypokinesis . Left ventricular ejection fraction, by  estimation, is 50 to 55%. The left ventricle has low normal function. The  left ventricle has no regional wall motion abnormalities.  2. Right ventricular systolic function is normal. The right ventricular  size is normal.  3. The mitral valve is abnormal. No evidence of mitral valve  regurgitation. No evidence of mitral stenosis.  4. The aortic valve is tricuspid. Aortic valve regurgitation is not  visualized. No aortic stenosis is present.  5. The inferior vena cava is normal in size with greater than 50%  respiratory variability, suggesting right atrial pressure of 3 mmHg.   Cath/PCI 02/06/2024: Conclusions: 1. Severe multivessel CAD, as detailed below, including 70% mid LAD (RFR 0.81), 40% ostial/proximal LCx, and  50% proximal RCA stenoses, as well as chronic total occlusions of proximal/mid LCx and mid RCA. 2. Widely patent LAD and RCA stents. 3. Patent overlapping ramus intermedius stents with focal in-stent restenosis of up to 70-80% (RFR 0.60). 4. Moderately reduced left ventricular systolic function with mid anterior/anterolateral akinesis (LVEF 35-45%). 5. Normal left ventricular filling pressure (LVEDP 10 mmHg).  Recommendations: 1. Given recurrent severe three-vessel CAD, reduced LVEF, and history of diabetes mellitus, will obtain cardiac surgery consultation for CABG and review the patient's images at tomorrow's HeartTeam meeting. 2. Hold clopidogrel  pending cardiac surgery evaluation. 3. Resume heparin  infusion 2 hours after TR band has been removed. 4. Maintain net even fluid balance. 5. Continue aggressive secondary prevention of CAD and antianginal therapy. 6. Escalate goal-directed medical therapy for HFmrEF, as tolerated.  )         Anesthesia Quick Evaluation

## 2024-06-19 ENCOUNTER — Encounter (HOSPITAL_COMMUNITY): Payer: Self-pay | Admitting: Vascular Surgery

## 2024-06-19 ENCOUNTER — Ambulatory Visit (HOSPITAL_COMMUNITY): Payer: Self-pay | Admitting: Physician Assistant

## 2024-06-19 ENCOUNTER — Encounter (HOSPITAL_COMMUNITY)
Admission: RE | Disposition: A | Discharge status: Home / Self Care | Payer: Self-pay | Source: Home / Self Care | Attending: Vascular Surgery

## 2024-06-19 ENCOUNTER — Ambulatory Visit (HOSPITAL_COMMUNITY)
Admission: RE | Admit: 2024-06-19 | Discharge: 2024-06-19 | Disposition: A | Discharge status: Home / Self Care | Attending: Vascular Surgery | Admitting: Vascular Surgery

## 2024-06-19 ENCOUNTER — Other Ambulatory Visit (HOSPITAL_COMMUNITY): Payer: Self-pay

## 2024-06-19 DIAGNOSIS — Z9889 Other specified postprocedural states: Secondary | ICD-10-CM | POA: Diagnosis not present

## 2024-06-19 DIAGNOSIS — I7776 Dissection of artery of upper extremity: Secondary | ICD-10-CM | POA: Insufficient documentation

## 2024-06-19 DIAGNOSIS — I742 Embolism and thrombosis of arteries of the upper extremities: Secondary | ICD-10-CM | POA: Diagnosis not present

## 2024-06-19 DIAGNOSIS — I2510 Atherosclerotic heart disease of native coronary artery without angina pectoris: Secondary | ICD-10-CM | POA: Diagnosis not present

## 2024-06-19 DIAGNOSIS — N183 Chronic kidney disease, stage 3 unspecified: Secondary | ICD-10-CM | POA: Diagnosis not present

## 2024-06-19 DIAGNOSIS — Z7985 Long-term (current) use of injectable non-insulin antidiabetic drugs: Secondary | ICD-10-CM | POA: Insufficient documentation

## 2024-06-19 DIAGNOSIS — E1165 Type 2 diabetes mellitus with hyperglycemia: Secondary | ICD-10-CM | POA: Insufficient documentation

## 2024-06-19 DIAGNOSIS — Z6841 Body Mass Index (BMI) 40.0 and over, adult: Secondary | ICD-10-CM | POA: Diagnosis not present

## 2024-06-19 DIAGNOSIS — Z794 Long term (current) use of insulin: Secondary | ICD-10-CM | POA: Insufficient documentation

## 2024-06-19 DIAGNOSIS — N1832 Chronic kidney disease, stage 3b: Secondary | ICD-10-CM

## 2024-06-19 DIAGNOSIS — I129 Hypertensive chronic kidney disease with stage 1 through stage 4 chronic kidney disease, or unspecified chronic kidney disease: Secondary | ICD-10-CM | POA: Diagnosis not present

## 2024-06-19 DIAGNOSIS — I130 Hypertensive heart and chronic kidney disease with heart failure and stage 1 through stage 4 chronic kidney disease, or unspecified chronic kidney disease: Secondary | ICD-10-CM | POA: Diagnosis not present

## 2024-06-19 DIAGNOSIS — Z7902 Long term (current) use of antithrombotics/antiplatelets: Secondary | ICD-10-CM | POA: Diagnosis not present

## 2024-06-19 DIAGNOSIS — E039 Hypothyroidism, unspecified: Secondary | ICD-10-CM | POA: Insufficient documentation

## 2024-06-19 DIAGNOSIS — I252 Old myocardial infarction: Secondary | ICD-10-CM | POA: Diagnosis not present

## 2024-06-19 DIAGNOSIS — I509 Heart failure, unspecified: Secondary | ICD-10-CM | POA: Insufficient documentation

## 2024-06-19 DIAGNOSIS — Z7984 Long term (current) use of oral hypoglycemic drugs: Secondary | ICD-10-CM | POA: Diagnosis not present

## 2024-06-19 DIAGNOSIS — M79601 Pain in right arm: Secondary | ICD-10-CM

## 2024-06-19 DIAGNOSIS — I70208 Unspecified atherosclerosis of native arteries of extremities, other extremity: Secondary | ICD-10-CM | POA: Diagnosis present

## 2024-06-19 DIAGNOSIS — Z955 Presence of coronary angioplasty implant and graft: Secondary | ICD-10-CM | POA: Insufficient documentation

## 2024-06-19 DIAGNOSIS — E1122 Type 2 diabetes mellitus with diabetic chronic kidney disease: Secondary | ICD-10-CM | POA: Insufficient documentation

## 2024-06-19 HISTORY — PX: PATCH ANGIOPLASTY: SHX6230

## 2024-06-19 HISTORY — PX: EMBOLECTOMY: SHX44

## 2024-06-19 HISTORY — PX: ARTERY REPAIR: SHX559

## 2024-06-19 LAB — GLUCOSE, CAPILLARY
Glucose-Capillary: 160 mg/dL — ABNORMAL HIGH (ref 70–99)
Glucose-Capillary: 172 mg/dL — ABNORMAL HIGH (ref 70–99)

## 2024-06-19 LAB — POCT I-STAT, CHEM 8
BUN: 22 mg/dL — ABNORMAL HIGH (ref 6–20)
Calcium, Ion: 1.06 mmol/L — ABNORMAL LOW (ref 1.15–1.40)
Chloride: 104 mmol/L (ref 98–111)
Creatinine, Ser: 1.4 mg/dL — ABNORMAL HIGH (ref 0.44–1.00)
Glucose, Bld: 160 mg/dL — ABNORMAL HIGH (ref 70–99)
HCT: 30 % — ABNORMAL LOW (ref 36.0–46.0)
Hemoglobin: 10.2 g/dL — ABNORMAL LOW (ref 12.0–15.0)
Potassium: 3.7 mmol/L (ref 3.5–5.1)
Sodium: 140 mmol/L (ref 135–145)
TCO2: 24 mmol/L (ref 22–32)

## 2024-06-19 LAB — POCT PREGNANCY, URINE: Preg Test, Ur: NEGATIVE

## 2024-06-19 LAB — TYPE AND SCREEN
ABO/RH(D): B POS
Antibody Screen: NEGATIVE

## 2024-06-19 LAB — SURGICAL PCR SCREEN
MRSA, PCR: NEGATIVE
Staphylococcus aureus: NEGATIVE

## 2024-06-19 SURGERY — EMBOLECTOMY
Anesthesia: General | Site: Arm Lower | Laterality: Right

## 2024-06-19 MED ORDER — FENTANYL CITRATE (PF) 250 MCG/5ML IJ SOLN
INTRAMUSCULAR | Status: DC | PRN
  Administered 2024-06-19 (×4): 50 ug via INTRAVENOUS

## 2024-06-19 MED ORDER — 0.9 % SODIUM CHLORIDE (POUR BTL) OPTIME
TOPICAL | Status: DC | PRN
  Administered 2024-06-19: 1000 mL

## 2024-06-19 MED ORDER — PROTAMINE SULFATE 10 MG/ML IV SOLN
INTRAVENOUS | Status: DC | PRN
  Administered 2024-06-19: 10 mg via INTRAVENOUS
  Administered 2024-06-19: 20 mg via INTRAVENOUS

## 2024-06-19 MED ORDER — SODIUM CHLORIDE 0.9 % IV SOLN: INTRAVENOUS | Status: DC

## 2024-06-19 MED ORDER — CHLORHEXIDINE GLUCONATE 0.12 % MT SOLN
15.0000 mL | Freq: Once | OROMUCOSAL | Status: AC
  Administered 2024-06-19: 15 mL via OROMUCOSAL
  Filled 2024-06-19: qty 15

## 2024-06-19 MED ORDER — HEPARIN 6000 UNIT IRRIGATION SOLUTION
Status: DC | PRN
  Administered 2024-06-19: 1

## 2024-06-19 MED ORDER — AMISULPRIDE (ANTIEMETIC) 5 MG/2ML IV SOLN: 10.0000 mg | Freq: Once | INTRAVENOUS | Status: DC | PRN

## 2024-06-19 MED ORDER — CHLORHEXIDINE GLUCONATE 4 % EX SOLN: 60.0000 mL | Freq: Once | CUTANEOUS | Status: DC

## 2024-06-19 MED ORDER — OXYCODONE HCL 5 MG PO TABS: 5.0000 mg | ORAL_TABLET | Freq: Once | ORAL | Status: DC | PRN

## 2024-06-19 MED ORDER — PROPOFOL 10 MG/ML IV BOLUS
INTRAVENOUS | Status: AC
  Filled 2024-06-19: qty 20

## 2024-06-19 MED ORDER — SUGAMMADEX SODIUM 200 MG/2ML IV SOLN
INTRAVENOUS | Status: DC | PRN
  Administered 2024-06-19: 500 mg via INTRAVENOUS

## 2024-06-19 MED ORDER — DEXMEDETOMIDINE HCL IN NACL 80 MCG/20ML IV SOLN
INTRAVENOUS | Status: DC | PRN
  Administered 2024-06-19 (×2): 8 ug via INTRAVENOUS

## 2024-06-19 MED ORDER — MIDAZOLAM HCL 2 MG/2ML IJ SOLN
INTRAMUSCULAR | Status: AC
  Filled 2024-06-19: qty 2

## 2024-06-19 MED ORDER — LIDOCAINE 2% (20 MG/ML) 5 ML SYRINGE
INTRAMUSCULAR | Status: DC | PRN
  Administered 2024-06-19: 100 mg via INTRAVENOUS

## 2024-06-19 MED ORDER — DEXAMETHASONE SOD PHOSPHATE PF 10 MG/ML IJ SOLN
INTRAMUSCULAR | Status: DC | PRN
  Administered 2024-06-19: 5 mg via INTRAVENOUS

## 2024-06-19 MED ORDER — ROCURONIUM BROMIDE 10 MG/ML (PF) SYRINGE
PREFILLED_SYRINGE | INTRAVENOUS | Status: DC | PRN
  Administered 2024-06-19: 100 mg via INTRAVENOUS

## 2024-06-19 MED ORDER — FENTANYL CITRATE (PF) 100 MCG/2ML IJ SOLN
25.0000 ug | INTRAMUSCULAR | Status: DC | PRN
  Administered 2024-06-19 (×3): 50 ug via INTRAVENOUS

## 2024-06-19 MED ORDER — CEFAZOLIN SODIUM-DEXTROSE 3-4 GM/150ML-% IV SOLN
3.0000 g | INTRAVENOUS | Status: AC
  Administered 2024-06-19: 3 g via INTRAVENOUS
  Filled 2024-06-19: qty 150

## 2024-06-19 MED ORDER — PHENYLEPHRINE 80 MCG/ML (10ML) SYRINGE FOR IV PUSH (FOR BLOOD PRESSURE SUPPORT)
PREFILLED_SYRINGE | INTRAVENOUS | Status: DC | PRN
  Administered 2024-06-19: 80 ug via INTRAVENOUS
  Administered 2024-06-19: 40 ug via INTRAVENOUS
  Administered 2024-06-19: 80 ug via INTRAVENOUS
  Administered 2024-06-19: 160 ug via INTRAVENOUS
  Administered 2024-06-19: 80 ug via INTRAVENOUS

## 2024-06-19 MED ORDER — OXYCODONE HCL 5 MG/5ML PO SOLN: 5.0000 mg | Freq: Once | ORAL | Status: DC | PRN

## 2024-06-19 MED ORDER — OXYCODONE HCL 5 MG PO TABS
5.0000 mg | ORAL_TABLET | ORAL | 0 refills | Status: AC | PRN
  Filled 2024-06-19: qty 12, 2d supply, fill #0

## 2024-06-19 MED ORDER — ONDANSETRON HCL 4 MG/2ML IJ SOLN
INTRAMUSCULAR | Status: DC | PRN
  Administered 2024-06-19: 4 mg via INTRAVENOUS

## 2024-06-19 MED ORDER — FENTANYL CITRATE (PF) 100 MCG/2ML IJ SOLN
INTRAMUSCULAR | Status: AC
  Filled 2024-06-19: qty 2

## 2024-06-19 MED ORDER — HEMOSTATIC AGENTS (NO CHARGE) OPTIME
TOPICAL | Status: DC | PRN
  Administered 2024-06-19: 1 via TOPICAL

## 2024-06-19 MED ORDER — ACETAMINOPHEN 500 MG PO TABS
1000.0000 mg | ORAL_TABLET | Freq: Once | ORAL | Status: AC
  Administered 2024-06-19: 1000 mg via ORAL
  Filled 2024-06-19: qty 2

## 2024-06-19 MED ORDER — FENTANYL CITRATE (PF) 250 MCG/5ML IJ SOLN
INTRAMUSCULAR | Status: AC
  Filled 2024-06-19: qty 5

## 2024-06-19 MED ORDER — HEPARIN 6000 UNIT IRRIGATION SOLUTION
Status: AC
  Filled 2024-06-19: qty 500

## 2024-06-19 MED ORDER — MIDAZOLAM HCL (PF) 2 MG/2ML IJ SOLN
INTRAMUSCULAR | Status: DC | PRN
  Administered 2024-06-19: 2 mg via INTRAVENOUS

## 2024-06-19 MED ORDER — PHENYLEPHRINE HCL-NACL 20-0.9 MG/250ML-% IV SOLN
INTRAVENOUS | Status: DC | PRN
  Administered 2024-06-19: 25 ug/min via INTRAVENOUS

## 2024-06-19 MED ORDER — ORAL CARE MOUTH RINSE: 15.0000 mL | Freq: Once | OROMUCOSAL | Status: AC

## 2024-06-19 MED ADMIN — Heparin Sodium (Porcine) Inj 1000 Unit/ML: 10000 [IU] | INTRAVENOUS | NDC 71288040211

## 2024-06-19 MED ADMIN — PROPOFOL 200 MG/20ML IV EMUL: 150 mg | INTRAVENOUS | NDC 00069020910

## 2024-06-19 SURGICAL SUPPLY — 31 items
BAG COUNTER SPONGE SURGICOUNT (BAG) ×2 IMPLANT
CANISTER SUCTION 3000ML PPV (SUCTIONS) ×2 IMPLANT
CATH EMB 2FR 60 (CATHETERS) IMPLANT
CATH EMB 3FR 40 (CATHETERS) IMPLANT
CATH EMB 4FR 40 (CATHETERS) IMPLANT
CLIP TI MEDIUM 24 (CLIP) ×2 IMPLANT
CLIP TI WIDE RED SMALL 24 (CLIP) ×2 IMPLANT
DERMABOND ADVANCED .7 DNX12 (GAUZE/BANDAGES/DRESSINGS) IMPLANT
ELECTRODE REM PT RTRN 9FT ADLT (ELECTROSURGICAL) ×2 IMPLANT
GLOVE BIO SURGEON STRL SZ7.5 (GLOVE) ×2 IMPLANT
GLOVE BIOGEL PI IND STRL 8 (GLOVE) ×2 IMPLANT
GOWN STRL REUS W/ TWL LRG LVL3 (GOWN DISPOSABLE) ×4 IMPLANT
GOWN STRL REUS W/ TWL XL LVL3 (GOWN DISPOSABLE) ×4 IMPLANT
HEMOSTAT SNOW SURGICEL 2X4 (HEMOSTASIS) IMPLANT
KIT BASIN OR (CUSTOM PROCEDURE TRAY) ×2 IMPLANT
KIT TURNOVER KIT B (KITS) ×2 IMPLANT
LOOP VESSEL MINI RED (MISCELLANEOUS) IMPLANT
PACK CV ACCESS (CUSTOM PROCEDURE TRAY) IMPLANT
PAD ARMBOARD POSITIONER FOAM (MISCELLANEOUS) ×4 IMPLANT
SOLN 0.9% NACL 1000 ML (IV SOLUTION) ×2 IMPLANT
SOLN 0.9% NACL POUR BTL 1000ML (IV SOLUTION) ×4 IMPLANT
SOLN STERILE WATER 1000 ML (IV SOLUTION) ×1 IMPLANT
SOLN STERILE WATER BTL 1000 ML (IV SOLUTION) ×2 IMPLANT
STOPCOCK 4 WAY LG BORE MALE ST (IV SETS) IMPLANT
SUT MNCRL AB 4-0 PS2 18 (SUTURE) ×2 IMPLANT
SUT PROLENE 6 0 BV (SUTURE) ×2 IMPLANT
SUT PROLENE 7 0 BV 1 (SUTURE) IMPLANT
SUT VIC AB 3-0 SH 27X BRD (SUTURE) ×2 IMPLANT
SYR 3ML LL SCALE MARK (SYRINGE) ×2 IMPLANT
TOWEL GREEN STERILE (TOWEL DISPOSABLE) ×2 IMPLANT
UNDERPAD 30X36 HEAVY ABSORB (UNDERPADS AND DIAPERS) ×2 IMPLANT

## 2024-06-19 NOTE — Anesthesia Procedure Notes (Signed)
 Procedure Name: Intubation Date/Time: 06/19/2024 7:52 AM  Performed by: Virgil Ee, CRNAPre-anesthesia Checklist: Patient identified, Patient being monitored, Timeout performed, Emergency Drugs available and Suction available Patient Re-evaluated:Patient Re-evaluated prior to induction Oxygen Delivery Method: Circle system utilized Preoxygenation: Pre-oxygenation with 100% oxygen Induction Type: IV induction Ventilation: Mask ventilation without difficulty Laryngoscope Size: Mac and 4 Grade View: Grade I Tube type: Oral Tube size: 7.0 mm Number of attempts: 1 Airway Equipment and Method: Stylet Placement Confirmation: ETT inserted through vocal cords under direct vision, positive ETCO2 and breath sounds checked- equal and bilateral Secured at: 22 cm Tube secured with: Tape Dental Injury: Teeth and Oropharynx as per pre-operative assessment

## 2024-06-19 NOTE — Transfer of Care (Signed)
 Immediate Anesthesia Transfer of Care Note  Patient: Lauren Delgado  Procedure(s) Performed: RIGHT RADIAL ARTERY THROMBECTOMY (Right: Arm Lower) REPAIR OF RIGHT RADIAL DISSECTION (Right: Arm Lower) ANGIOPLASTY, USING VEIN PATCH (Right: Arm Lower)  Patient Location: PACU  Anesthesia Type:General  Level of Consciousness: drowsy and responds to stimulation  Airway & Oxygen Therapy: Patient Spontanous Breathing  Post-op Assessment: Report given to RN and Post -op Vital signs reviewed and stable  Post vital signs: Reviewed and stable  Last Vitals:  Vitals Value Taken Time  BP 124/65 06/19/24 10:09  Temp    Pulse 83 06/19/24 10:15  Resp 13 06/19/24 10:15  SpO2 94 % 06/19/24 10:15  Vitals shown include unfiled device data.  Last Pain:  Vitals:   06/19/24 0615  PainSc: 0-No pain         Complications: No notable events documented.

## 2024-06-19 NOTE — Discharge Instructions (Signed)
   [facility]  Discharge Instructions   Please refer to the following instructions for your post-procedure care. Your surgeon or physician assistant will discuss any changes with you.  Activity  You may drive the day following your surgery, if you are comfortable and no longer taking prescription pain medication. Resume full activity as the soreness in your incision resolves.  Bathing/Showering  You may shower after you go home. Keep your incision dry for 48 hours. Do not soak in a bathtub, hot tub, or swim until the incision heals completely.   Incision Care  Clean your incision with mild soap and water  after 48 hours. Pat the area dry with a clean towel. You do not need a bandage unless otherwise instructed. Do not apply any ointments or creams to your incision. You may have skin glue on your incision. Do not peel it off. It will come off on its own in about one week.   Diet  Resume your normal diet. There are not special food restrictions following this procedure. In order to heal from your surgery, it is CRITICAL to get adequate nutrition. Your body requires vitamins, minerals, and protein. Vegetables are the best source of vitamins and minerals. Vegetables also provide the perfect balance of protein. Processed food has little nutritional value, so try to avoid this.  Medications  Resume taking all of your medications. If your incision is causing pain, you may take over-the counter pain relievers such as acetaminophen  (Tylenol ). If you were prescribed a stronger pain medication, please be aware these medications can cause nausea and constipation. Prevent nausea by taking the medication with a snack or meal. Avoid constipation by drinking plenty of fluids and eating foods with high amount of fiber, such as fruits, vegetables, and grains.   Do not take Tylenol  if you are taking prescription pain medications.  Follow up  Your surgeon may want to see  you in the office following your access surgery. If so, this will be arranged at the time of your surgery.  Please call us  immediately for any of the following conditions:  Increased pain, redness, drainage (pus) from your incision site Fever of 101 degrees or higher Severe or worsening pain at your incision site  Reduce your risk of vascular disease:  Stop smoking. If you would like help, call [facility] at 1-800-QUIT-NOW ((305)635-3042) or [place] at 602-796-5176  Manage your cholesterol Maintain a desired weight Control your diabetes Keep your blood pressure down   06/19/2024 Lauren Delgado [id number] 23-Feb-1986  Surgeon(s): Gretta Lonni PARAS, MD  Procedure(s): RIGHT RADIAL ARTERY THROMBECTOMY REPAIR OF RIGHT RADIAL DISSECTION ANGIOPLASTY, USING VEIN PATCH   If you have any questions, please call the office at [phone].
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
## 2024-06-19 NOTE — Anesthesia Procedure Notes (Signed)
 Arterial Line Insertion Start/End10/17/2025 7:20 AM, 06/19/2024 7:25 AM Performed by: Zelphia Norleen HERO, CRNA, CRNA  Patient location: Pre-op. Preanesthetic checklist: patient identified, IV checked, site marked, risks and benefits discussed, surgical consent, monitors and equipment checked, pre-op evaluation, timeout performed and anesthesia consent Lidocaine  1% used for infiltration Left, radial was placed Catheter size: 20 G Hand hygiene performed  and maximum sterile barriers used   Attempts: 1 Procedure performed using ultrasound to evaluate access site. Ultrasound Notes:relevant anatomy identified, ultrasound used to visualize needle entry, vessel patent under ultrasound and image(s) printed for medical record. Following insertion, dressing applied and Biopatch. Post procedure assessment: normal and unchanged  Patient tolerated the procedure well with no immediate complications.

## 2024-06-19 NOTE — Op Note (Signed)
 Date: June 19, 2024  Preoperative diagnosis: Right radial occlusion after transradial cath  Postoperative diagnosis: 1.  Right radial artery dissection 2.  Right radial occlusion after transradial cath  Procedure: 1.  Repair of right radial artery dissection 2.  Right radial thrombectomy 3.  Patch angioplasty right radial artery using radial vein  Surgeon: Dr. Lonni DOROTHA Gaskins, MD  Assistant: Ahmed Holster, PA  Indications: 38 year old female with multiple risk factors that recently underwent a right transradial cath on 05/14/2024.  Developed symptoms last week of right forearm and digit pain and numbness and found to have occluded radial artery.  She presents today for thrombectomy after risk benefits discussed.  Assistant was needed given the complexity case and also for thrombectomy.  Findings: Cutdown on the right radial artery at the wrist.  Initially opened the artery transversely and this was very small about 1.5 mm.  I could not get a Fogarty to pass proximally and met resistance.  I then dissected out more of the radial artery and opened this longitudinally and there was a dissection in the radial artery from transradial access that we are able to repair and then I was able to thrombectomize the artery to get pulsatile inflow.  A vein patch was used to repair the artery with radial vein.  Palpable radial pulse at completion.  Anesthesia: General  Details: Patient was taken to the operating room after informed consent was obtained.  Placed on operative table supine position.  General endotracheal anesthesia was induced.  The right arm was then prepped and draped in standard sterile fashion.  Antibiotics were given timeout performed.  Went ahead and made a longitudinal incision over the radial artery at the wrist after we evaluated this with ultrasound.  Dissected down with Bovie cautery with help of my assistant.  Ultimately I was able to dissect out the artery that was quite  small and very scarred and diseased appearing.  I dissected out enough to get small Vesseloops for proximal distal control.  Patient was given 10,000 units of IV heparin .  I use 11 blade scalpel Potts scissors and made a transverse arteriotomy.  I could not get a #3 or #2 Fogarty to pass proximally.  This easily passed into the hand and I got no thrombus with good backbleeding.  I elected to extend the arteriotomy proximally with Potts scissors and there was a clear dissection with more chronic appearing thrombus.  Ultimately opened the artery over about a 3 cm distance until we got to the end of the dissection and could visualize the true lumen.  I removed the dissection flap and direct visualization.  I passed a #3 Fogarty and got multiple plugs of acute appearing thrombus with excellent pulsatile inflow.  I used a angled bulldog to control the inflow.  I elected to harvest the radial vein that was sitting beside this over right angle clamps and this was divided and spatulated.  I then performed a vein patch angioplasty on the radial artery using 6-0 Prolene parachute technique with the help of my assistant.  We de-aired this prior to completion.  Multiple additional repair stitches were placed in the patch with 6-0 Prolene.  Palpable radial pulse at completion.  Surgicel snow used for hemostasis.  The wound was irrigated out and this was closed with 3-0 Vicryl 4-0 Monocryl and Dermabond.  Complication: None  Condition: Stable  Lonni DOROTHA Gaskins, MD [facility] [place] Office: [phone]   [name]

## 2024-06-19 NOTE — Progress Notes (Signed)
 Last dose dose of  Semaglutide  this Tuesday. Non GI symptoms. Dr. Niels is aware.

## 2024-06-20 NOTE — Anesthesia Postprocedure Evaluation (Signed)
 Anesthesia Post Note  Patient: Lauren Delgado  Procedure(s) Performed: RIGHT RADIAL ARTERY THROMBECTOMY (Right: Arm Lower) REPAIR OF RIGHT RADIAL DISSECTION (Right: Arm Lower) ANGIOPLASTY, USING VEIN PATCH (Right: Arm Lower)     Patient location during evaluation: PACU Anesthesia Type: General Level of consciousness: awake and alert Pain management: pain level controlled Vital Signs Assessment: post-procedure vital signs reviewed and stable Respiratory status: spontaneous breathing, nonlabored ventilation, respiratory function stable and patient connected to nasal cannula oxygen Cardiovascular status: blood pressure returned to baseline and stable Postop Assessment: no apparent nausea or vomiting Anesthetic complications: no   No notable events documented.  Last Vitals:  Vitals:   06/19/24 1045 06/19/24 1100  BP: 135/68 125/76  Pulse: 81 81  Resp: 14 15  Temp:  37 C  SpO2: 92% 92%    Last Pain:  Vitals:   06/19/24 1100  PainSc: Asleep                 Addalyne Vandehei L Matisha Termine

## 2024-06-21 ENCOUNTER — Encounter (HOSPITAL_COMMUNITY): Payer: Self-pay | Admitting: Vascular Surgery

## 2024-06-22 ENCOUNTER — Encounter: Payer: Self-pay | Admitting: Emergency Medicine

## 2024-06-22 NOTE — Telephone Encounter (Signed)
 Call patient and clarify the following please.  What medication is she referring to?  Ozempic ?  She was having trouble with GLP-1 agonist side effects.  Get me more details please.  Thanks.

## 2024-06-24 NOTE — Telephone Encounter (Signed)
 Recommend to stay on 1 mg dose for 3 or 4 more weeks

## 2024-06-24 NOTE — Telephone Encounter (Signed)
 This would not come from our office. The last time we saw her was in April.

## 2024-06-26 ENCOUNTER — Encounter: Admitting: Vascular Surgery

## 2024-06-29 ENCOUNTER — Encounter: Payer: Self-pay | Admitting: Vascular Surgery

## 2024-06-30 ENCOUNTER — Other Ambulatory Visit (HOSPITAL_COMMUNITY): Payer: Self-pay

## 2024-07-02 ENCOUNTER — Other Ambulatory Visit (HOSPITAL_COMMUNITY): Payer: Self-pay

## 2024-07-02 ENCOUNTER — Ambulatory Visit: Payer: Self-pay | Admitting: Endocrinology

## 2024-07-02 ENCOUNTER — Encounter: Payer: Self-pay | Admitting: Endocrinology

## 2024-07-02 ENCOUNTER — Telehealth (HOSPITAL_COMMUNITY): Payer: Self-pay

## 2024-07-02 ENCOUNTER — Ambulatory Visit: Admitting: Endocrinology

## 2024-07-02 ENCOUNTER — Other Ambulatory Visit: Payer: Self-pay

## 2024-07-02 VITALS — BP 124/82 | HR 98 | Ht 62.0 in | Wt 317.5 lb

## 2024-07-02 DIAGNOSIS — Z794 Long term (current) use of insulin: Secondary | ICD-10-CM

## 2024-07-02 DIAGNOSIS — E1165 Type 2 diabetes mellitus with hyperglycemia: Secondary | ICD-10-CM

## 2024-07-02 DIAGNOSIS — E1169 Type 2 diabetes mellitus with other specified complication: Secondary | ICD-10-CM

## 2024-07-02 LAB — POCT GLYCOSYLATED HEMOGLOBIN (HGB A1C): Hemoglobin A1C: 8 % — AB (ref 4.0–5.6)

## 2024-07-02 MED ORDER — LANCETS MISC
1.0000 | 0 refills | Status: AC
  Filled 2024-07-02: qty 100, 33d supply, fill #0

## 2024-07-02 MED ORDER — BLOOD GLUCOSE TEST VI STRP
1.0000 | ORAL_STRIP | Freq: Three times a day (TID) | 3 refills | Status: AC
  Filled 2024-07-02: qty 100, 33d supply, fill #0
  Filled 2024-08-23: qty 100, 33d supply, fill #1

## 2024-07-02 MED ORDER — BLOOD GLUCOSE MONITOR SYSTEM W/DEVICE KIT
1.0000 | PACK | Freq: Three times a day (TID) | 0 refills | Status: AC
  Filled 2024-07-02: qty 1, 30d supply, fill #0

## 2024-07-02 MED ORDER — LANCET DEVICE MISC
1.0000 | Freq: Three times a day (TID) | 0 refills | Status: AC
  Filled 2024-07-02: qty 1, 30d supply, fill #0

## 2024-07-02 MED ORDER — SEMAGLUTIDE (2 MG/DOSE) 8 MG/3ML ~~LOC~~ SOPN
2.0000 mg | PEN_INJECTOR | SUBCUTANEOUS | 4 refills | Status: AC
  Filled 2024-07-02: qty 3, 28d supply, fill #0
  Filled 2024-08-14: qty 3, 28d supply, fill #1

## 2024-07-02 MED ORDER — DAPAGLIFLOZIN PROPANEDIOL 5 MG PO TABS
5.0000 mg | ORAL_TABLET | Freq: Every day | ORAL | 3 refills | Status: AC
  Filled 2024-07-02: qty 90, 90d supply, fill #0
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
## 2024-07-02 NOTE — Patient Instructions (Signed)
 Diabetes regimen Continue Humulin  70/30 20 units 2 times a day. Increase Ozempic  to 2 mg weekly. Continue glipizide  extended release 5 mg daily. Start Farxiga 5 mg daily.  Avoid dehydration keep hydrated with drinking water  regularly.  Check blood sugar before meals and at bedtime, bring glucometer in the follow-up visit to review glucose data.

## 2024-07-02 NOTE — Addendum Note (Signed)
 Addended by: HOLLI MOM on: 07/02/2024 04:15 PM   Modules accepted: Orders

## 2024-07-03 ENCOUNTER — Telehealth: Payer: Self-pay

## 2024-07-03 ENCOUNTER — Other Ambulatory Visit (HOSPITAL_COMMUNITY): Payer: Self-pay

## 2024-07-03 NOTE — Telephone Encounter (Signed)
 Pharmacy Patient Advocate Encounter   Received notification from Pt Calls Messages that prior authorization for Farxiga 5mg  is required/requested.   Insurance verification completed.   The patient is insured through [facility].   Per test claim: PA required and submitted KEY/EOC/Request #: [id number] from 07/03/2024 to 07/03/2025 [id number]

## 2024-07-11 ENCOUNTER — Other Ambulatory Visit (HOSPITAL_COMMUNITY): Payer: Self-pay

## 2024-07-14 ENCOUNTER — Ambulatory Visit: Attending: Vascular Surgery | Admitting: Physician Assistant

## 2024-07-14 VITALS — BP 143/78 | HR 98 | Temp 97.9°F | Wt 320.7 lb

## 2024-07-14 DIAGNOSIS — I70208 Unspecified atherosclerosis of native arteries of extremities, other extremity: Secondary | ICD-10-CM

## 2024-07-15 NOTE — Progress Notes (Unsigned)
 POST OPERATIVE OFFICE NOTE    CC:  F/u for surgery  HPI:  Lauren Delgado is a 38 y.o. female who is here for postop visit.  She recently underwent right radial artery thrombectomy, repair of right radial artery dissection, and right radial artery vein patch angioplasty on 06/19/2024 by Dr. Gretta.  This was done for occlusion of the right radial artery after right transradial cath on 05/14/2024, causing numbness and pain of the forearm and digits 1 through 3.  She returns today for follow-up.  She has no complaints at today's visit.  She denies any issues with her right wrist incision such as dehiscence or drainage.  She says that the numbness in her fingertips has significantly improved since surgery.  She does continue to have some intermittent pain in her forearm and first digit.  She feels like her strength in the right hand is about the same.   Allergies  Allergen Reactions   Jardiance  [Empagliflozin ]     Dizziness even with 10 mg daily   Metformin  And Related Diarrhea    Current Outpatient Medications  Medication Sig Dispense Refill   aspirin  EC 81 MG tablet Take 81 mg by mouth daily. Swallow whole.     atorvastatin  (LIPITOR ) 40 MG tablet Take 1 tablet (40 mg total) by mouth daily. 90 tablet 3   Blood Glucose Monitoring Suppl (BLOOD GLUCOSE MONITOR SYSTEM) w/Device KIT Use in the morning, at noon, and at bedtime. 1 kit 0   carvedilol  (COREG ) 12.5 MG tablet Take 3 tablets (37.5 mg total) by mouth 2 (two) times daily with a meal. 180 tablet 3   clopidogrel  (PLAVIX ) 75 MG tablet Take 1 tablet (75 mg total) by mouth daily. 90 tablet 3   dapagliflozin propanediol (FARXIGA) 5 MG TABS tablet Take 1 tablet (5 mg total) by mouth daily. 90 tablet 3   Evolocumab  (REPATHA  SURECLICK) 140 MG/ML SOAJ Inject 140 mg into the skin every 14 (fourteen) days. 6 mL 3   furosemide  (LASIX ) 40 MG tablet Take 1 tablet (40 mg total) by mouth 2 (two) times daily. 180 tablet 3   glipiZIDE  (GLUCOTROL  XL) 5 MG  24 hr tablet Take 1 tablet (5 mg total) by mouth daily with breakfast. 90 tablet 1   Glucose Blood (BLOOD GLUCOSE TEST STRIPS) STRP Use in the morning, at noon, and at bedtime. 100 each 3   insulin  isophane & regular human KwikPen (HUMULIN  70/30 KWIKPEN) (70-30) 100 UNIT/ML KwikPen Inject 15 Units into the skin 2 (two) times daily. (Patient taking differently: Inject 20 Units into the skin 2 (two) times daily.) 3 mL 5   Insulin  Pen Needle (BD PEN NEEDLE NANO 2ND GEN) 32G X 4 MM MISC USE TO INJECT INSULIN  UP TO 4 TIMES DAILY AS NEEDED 100 each 0   isosorbide  mononitrate (IMDUR ) 120 MG 24 hr tablet Take 1 tablet (120 mg total) by mouth daily. 90 tablet 3   Lancet Device MISC 1 each by Does not apply route in the morning, at noon, and at bedtime. May substitute to any manufacturer covered by patient's insurance. 1 each 0   Lancets MISC Use as directed. 100 each 0   nitroGLYCERIN  (NITROSTAT ) 0.4 MG SL tablet Place 0.4 mg under the tongue every 5 (five) minutes as needed for chest pain.     oxyCODONE  (ROXICODONE ) 5 MG immediate release tablet Take 1 tablet (5 mg total) by mouth every 4 (four) hours as needed for severe pain (pain score 7-10). 12 tablet 0  sacubitril -valsartan  (ENTRESTO ) 97-103 MG Take 1 tablet by mouth 2 (two) times daily. 180 tablet 3   Semaglutide , 2 MG/DOSE, 8 MG/3ML SOPN Inject 2 mg as directed once a week. 9 mL 4   spironolactone  (ALDACTONE ) 25 MG tablet Take 1 tablet (25 mg total) by mouth daily. (Patient taking differently: Take 25 mg by mouth every other day.) 90 tablet 3   valACYclovir  (VALTREX ) 500 MG tablet Take 1 tablet (500 mg total) by mouth 2 (two) times daily. (Patient taking differently: Take 500 mg by mouth 2 (two) times daily as needed (Breakout).) 180 tablet 1   No current facility-administered medications for this visit.     ROS:  See HPI  Physical Exam:  Incision: Right wrist incision well-healed without signs of infection or hematoma Extremities: Palpable  right radial pulse.  Brisk right ulnar and palmar arch Doppler signals Neuro: Intact sensation of right 1st through 5th digits.  Right hand grip strength 4/5   Assessment/Plan:  This is a 38 y.o. female who is here for a post op visit  -The patiently underwent right radial artery dissection repair and thrombectomy for an occluded radial artery. Her right wrist incision is well healed without hematoma or signs of infection -On exam her right hand remains well perfused with a palpable radial pulse and brisk ulnar and palmar arch doppler signals -She has improved numbness and pain of the right hand with only intermittent pain in the forearm and first digit. She continues to have some mild grip strength weakness of the right hand.  -It was suspected that some of the patient's pain and weakness was also coming from a possible nerve injury from her previous transradial cath.  - I will refer the patient to an occupational therapist to help with her residual right hand weakness.  She can follow-up with our office as needed   Ahmed Holster, PA-C [facility] [phone]   Clinic MD:  Miguel

## 2024-07-21 ENCOUNTER — Other Ambulatory Visit: Payer: Self-pay

## 2024-07-21 ENCOUNTER — Other Ambulatory Visit (HOSPITAL_COMMUNITY): Payer: Self-pay

## 2024-07-23 ENCOUNTER — Other Ambulatory Visit (HOSPITAL_COMMUNITY): Payer: Self-pay

## 2024-08-04 ENCOUNTER — Ambulatory Visit: Admitting: Occupational Therapy

## 2024-08-05 ENCOUNTER — Other Ambulatory Visit (HOSPITAL_COMMUNITY): Payer: Self-pay

## 2024-08-06 ENCOUNTER — Other Ambulatory Visit: Payer: Self-pay

## 2024-08-06 ENCOUNTER — Ambulatory Visit

## 2024-08-06 DIAGNOSIS — R29898 Other symptoms and signs involving the musculoskeletal system: Secondary | ICD-10-CM | POA: Insufficient documentation

## 2024-08-06 DIAGNOSIS — M25531 Pain in right wrist: Secondary | ICD-10-CM | POA: Insufficient documentation

## 2024-08-06 DIAGNOSIS — R208 Other disturbances of skin sensation: Secondary | ICD-10-CM | POA: Insufficient documentation

## 2024-08-06 DIAGNOSIS — M79641 Pain in right hand: Secondary | ICD-10-CM | POA: Insufficient documentation

## 2024-08-06 DIAGNOSIS — L905 Scar conditions and fibrosis of skin: Secondary | ICD-10-CM | POA: Insufficient documentation

## 2024-08-06 DIAGNOSIS — M6281 Muscle weakness (generalized): Secondary | ICD-10-CM | POA: Insufficient documentation

## 2024-08-06 DIAGNOSIS — R278 Other lack of coordination: Secondary | ICD-10-CM | POA: Diagnosis present

## 2024-08-06 NOTE — Patient Instructions (Addendum)
 SABRA
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
## 2024-08-11 ENCOUNTER — Ambulatory Visit: Admitting: Internal Medicine

## 2024-08-14 ENCOUNTER — Ambulatory Visit

## 2024-08-14 ENCOUNTER — Telehealth: Payer: Self-pay

## 2024-08-14 NOTE — Telephone Encounter (Signed)
 Pt no-showed for OT appointment today. Therefore, OT called pt's listed phone number [phone]). OT left voicemail reminding pt of next OT appointment date/time, provided education on cancellation/no-show policy, recommended to pt to call clinic office if pt is unable to make appointments, and provided contact information of clinic. Rocky Dutch, OTR/L

## 2024-08-16 ENCOUNTER — Other Ambulatory Visit (HOSPITAL_COMMUNITY): Payer: Self-pay

## 2024-08-17 ENCOUNTER — Other Ambulatory Visit: Payer: Self-pay

## 2024-08-18 ENCOUNTER — Ambulatory Visit: Admitting: Occupational Therapy

## 2024-08-18 ENCOUNTER — Other Ambulatory Visit: Payer: Self-pay

## 2024-08-18 DIAGNOSIS — R278 Other lack of coordination: Secondary | ICD-10-CM | POA: Diagnosis not present

## 2024-08-18 DIAGNOSIS — R29898 Other symptoms and signs involving the musculoskeletal system: Secondary | ICD-10-CM

## 2024-08-18 DIAGNOSIS — L905 Scar conditions and fibrosis of skin: Secondary | ICD-10-CM

## 2024-08-18 DIAGNOSIS — M25531 Pain in right wrist: Secondary | ICD-10-CM

## 2024-08-18 DIAGNOSIS — M6281 Muscle weakness (generalized): Secondary | ICD-10-CM

## 2024-08-18 DIAGNOSIS — M79641 Pain in right hand: Secondary | ICD-10-CM

## 2024-08-18 DIAGNOSIS — R208 Other disturbances of skin sensation: Secondary | ICD-10-CM
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
## 2024-08-18 NOTE — Patient Instructions (Signed)
   WEBSITE: [email or web address]

## 2024-08-24 ENCOUNTER — Other Ambulatory Visit (HOSPITAL_COMMUNITY): Payer: Self-pay

## 2024-08-24 ENCOUNTER — Other Ambulatory Visit: Payer: Self-pay

## 2024-08-28 ENCOUNTER — Ambulatory Visit

## 2024-09-01 ENCOUNTER — Ambulatory Visit: Admitting: Occupational Therapy

## 2024-09-04 ENCOUNTER — Ambulatory Visit

## 2024-09-08 ENCOUNTER — Ambulatory Visit: Attending: Vascular Surgery

## 2024-09-08 DIAGNOSIS — R278 Other lack of coordination: Secondary | ICD-10-CM | POA: Insufficient documentation

## 2024-09-08 DIAGNOSIS — R208 Other disturbances of skin sensation: Secondary | ICD-10-CM | POA: Insufficient documentation

## 2024-09-08 DIAGNOSIS — M25531 Pain in right wrist: Secondary | ICD-10-CM | POA: Insufficient documentation

## 2024-09-08 DIAGNOSIS — M6281 Muscle weakness (generalized): Secondary | ICD-10-CM | POA: Insufficient documentation

## 2024-09-08 DIAGNOSIS — M79641 Pain in right hand: Secondary | ICD-10-CM | POA: Insufficient documentation

## 2024-09-08 DIAGNOSIS — R29898 Other symptoms and signs involving the musculoskeletal system: Secondary | ICD-10-CM | POA: Insufficient documentation

## 2024-09-08 DIAGNOSIS — L905 Scar conditions and fibrosis of skin: Secondary | ICD-10-CM | POA: Insufficient documentation

## 2024-09-11 ENCOUNTER — Ambulatory Visit

## 2024-09-11 DIAGNOSIS — L905 Scar conditions and fibrosis of skin: Secondary | ICD-10-CM

## 2024-09-11 DIAGNOSIS — R29898 Other symptoms and signs involving the musculoskeletal system: Secondary | ICD-10-CM | POA: Diagnosis present

## 2024-09-11 DIAGNOSIS — R278 Other lack of coordination: Secondary | ICD-10-CM

## 2024-09-11 DIAGNOSIS — M6281 Muscle weakness (generalized): Secondary | ICD-10-CM

## 2024-09-11 DIAGNOSIS — M25531 Pain in right wrist: Secondary | ICD-10-CM

## 2024-09-11 DIAGNOSIS — R208 Other disturbances of skin sensation: Secondary | ICD-10-CM | POA: Diagnosis present

## 2024-09-11 DIAGNOSIS — M79641 Pain in right hand: Secondary | ICD-10-CM | POA: Diagnosis present
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
## 2024-09-11 NOTE — Patient Instructions (Signed)
 SABRA

## 2024-09-15 ENCOUNTER — Other Ambulatory Visit: Payer: Self-pay

## 2024-09-15 ENCOUNTER — Other Ambulatory Visit (HOSPITAL_COMMUNITY): Payer: Self-pay | Admitting: Family Medicine

## 2024-09-15 ENCOUNTER — Ambulatory Visit

## 2024-09-15 ENCOUNTER — Other Ambulatory Visit: Payer: Self-pay | Admitting: Emergency Medicine

## 2024-09-15 ENCOUNTER — Other Ambulatory Visit (HOSPITAL_COMMUNITY): Payer: Self-pay

## 2024-09-15 DIAGNOSIS — M6281 Muscle weakness (generalized): Secondary | ICD-10-CM | POA: Diagnosis not present

## 2024-09-15 DIAGNOSIS — R278 Other lack of coordination: Secondary | ICD-10-CM

## 2024-09-15 DIAGNOSIS — M79641 Pain in right hand: Secondary | ICD-10-CM

## 2024-09-15 DIAGNOSIS — R208 Other disturbances of skin sensation: Secondary | ICD-10-CM

## 2024-09-15 DIAGNOSIS — I152 Hypertension secondary to endocrine disorders: Secondary | ICD-10-CM

## 2024-09-15 DIAGNOSIS — L905 Scar conditions and fibrosis of skin: Secondary | ICD-10-CM

## 2024-09-15 DIAGNOSIS — M25531 Pain in right wrist: Secondary | ICD-10-CM

## 2024-09-15 MED ORDER — HUMULIN 70/30 KWIKPEN (70-30) 100 UNIT/ML ~~LOC~~ SUPN
15.0000 [IU] | PEN_INJECTOR | Freq: Two times a day (BID) | SUBCUTANEOUS | 5 refills | Status: AC
  Filled 2024-09-15: qty 3, 10d supply, fill #0

## 2024-09-15 MED ORDER — CARVEDILOL 12.5 MG PO TABS
37.5000 mg | ORAL_TABLET | Freq: Two times a day (BID) | ORAL | 3 refills | Status: AC
  Filled 2024-09-15: qty 180, 30d supply, fill #0

## 2024-09-15 NOTE — Patient Instructions (Addendum)
 SABRA
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
## 2024-09-16 ENCOUNTER — Ambulatory Visit: Admitting: Emergency Medicine

## 2024-09-16 ENCOUNTER — Encounter: Payer: Self-pay | Admitting: Emergency Medicine

## 2024-09-16 VITALS — BP 122/80 | HR 92 | Temp 98.1°F | Ht 62.0 in | Wt 318.0 lb

## 2024-09-16 DIAGNOSIS — N1832 Chronic kidney disease, stage 3b: Secondary | ICD-10-CM

## 2024-09-16 DIAGNOSIS — E785 Hyperlipidemia, unspecified: Secondary | ICD-10-CM | POA: Diagnosis not present

## 2024-09-16 DIAGNOSIS — E1159 Type 2 diabetes mellitus with other circulatory complications: Secondary | ICD-10-CM

## 2024-09-16 DIAGNOSIS — E1169 Type 2 diabetes mellitus with other specified complication: Secondary | ICD-10-CM

## 2024-09-16 DIAGNOSIS — I5042 Chronic combined systolic (congestive) and diastolic (congestive) heart failure: Secondary | ICD-10-CM

## 2024-09-16 DIAGNOSIS — Z8673 Personal history of transient ischemic attack (TIA), and cerebral infarction without residual deficits: Secondary | ICD-10-CM | POA: Diagnosis not present

## 2024-09-16 DIAGNOSIS — Z7984 Long term (current) use of oral hypoglycemic drugs: Secondary | ICD-10-CM

## 2024-09-16 DIAGNOSIS — I152 Hypertension secondary to endocrine disorders: Secondary | ICD-10-CM | POA: Diagnosis not present

## 2024-09-16 DIAGNOSIS — I25118 Atherosclerotic heart disease of native coronary artery with other forms of angina pectoris: Secondary | ICD-10-CM

## 2024-09-16 NOTE — Assessment & Plan Note (Signed)
 No recent anginal episodes Continues isosorbide  mononitrate 90 mg daily along with Plavix  75 mg daily and carvedilol  37.5 mg twice a day

## 2024-09-16 NOTE — Assessment & Plan Note (Signed)
 Clinically stable.  Normovolemic. Most recent cardiologist office visit notes reviewed Cardiologist recently increased Entresto  to 97-103 twice a day, Lasix  40 mg daily, Jardiance  10 mg daily, Aldactone  25 mg daily Also on carvedilol  37.5 mg twice a day

## 2024-09-16 NOTE — Assessment & Plan Note (Addendum)
 Last hemoglobin A1c at 8 Endocrinologist's recommendation as follows: I) increase Ozempic  from 1 to 2 mg weekly with the next refill. II) continue Humulin  70/30 insulin  20 units 2 times a day. III) continue glipizide  extended release 5 mg daily. IV) start Farxiga  5 mg daily.  Advised for well hydration.  Patient had dizziness with Jardiance  in the past ?  Dehydration related. BP Readings from Last 3 Encounters:  09/16/24 122/80  07/14/24 (!) 143/78  07/02/24 124/82  Well-controlled hypertension Continue carvedilol  37.5 mg twice a day, Entresto  97-103 twice a day, and Aldactone  25 mg daily.

## 2024-09-16 NOTE — Progress Notes (Signed)
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 taking the medicine. Pregnancy If you are about to stop having your period (premenopausal) and you may become pregnant, seek counseling before you get pregnant. Take 400 to 800 micrograms (mcg) of folic acid every day if you become pregnant. Ask for birth control (contraception) if you want to prevent pregnancy. Osteoporosis and menopause Osteoporosis is a disease in which the bones lose minerals and strength with aging. This can result in bone fractures. If you are 27 years old or older, or if you are at risk for osteoporosis and fractures, ask your health care provider if you should: Be screened for bone loss. Take a calcium  or vitamin D supplement to lower your risk of fractures. Be given hormone replacement therapy (HRT) to treat symptoms of menopause. Follow these instructions at home: Alcohol use Do not drink alcohol if: Your health care provider tells you not to drink. You are pregnant, may be pregnant, or are planning to become pregnant. If you drink alcohol: Limit how much you have to: 0-1 drink a day. Know how much alcohol is in your drink. In the [place] one drink equals one 12 oz bottle  of beer (355 mL), one 5 oz glass of wine (148 mL), or one 1 oz glass of hard liquor (44 mL). Lifestyle Do not use any products that contain nicotine or tobacco. These products include cigarettes, chewing tobacco, and vaping devices, such as e-cigarettes. If you need help quitting, ask your health care provider. Do not use street drugs. Do not share needles. Ask your health care provider for help if you need support or information about quitting drugs. General instructions Schedule regular health, dental, and eye exams. Stay current with your vaccines. Tell your health care provider if: You often feel depressed. You have ever been abused or do not feel safe at home. Summary Adopting a healthy lifestyle and getting preventive care are important in promoting health and wellness. Follow your health care provider's instructions about healthy diet, exercising, and getting tested or screened for diseases. Follow your health care provider's instructions on monitoring your cholesterol and blood pressure. This information is not intended to replace advice given to you by your health care provider. Make sure you discuss any questions you have with your health care provider. Document Revised: 01/09/2021 Document Reviewed: 01/09/2021 Elsevier Patient Education  2024 [employer]    Emil Schaumann, MD  Primary Care at [facility]     [1]  Allergies Allergen Reactions   Jardiance  [Empagliflozin ]     Dizziness even with 10 mg daily   Metformin  And Related Diarrhea

## 2024-09-16 NOTE — Assessment & Plan Note (Signed)
 Secondary stroke prevention discussed Importance of blood pressure, diabetes, cholesterol control discussed Management discussed Continues daily aspirin and Plavix

## 2024-09-16 NOTE — Patient Instructions (Signed)

## 2024-09-16 NOTE — Assessment & Plan Note (Signed)
 Clinically stable Presently on atorvastatin  40 mg daily and Repatha  every 14 days Diet and nutrition discussed

## 2024-09-16 NOTE — Assessment & Plan Note (Signed)
 Advised to stay well-hydrated and avoid NSAIDs Continue Jardiance  10 mg daily.  Higher doses give her severe lightheadedness

## 2024-09-18 ENCOUNTER — Ambulatory Visit

## 2024-09-24 ENCOUNTER — Encounter: Payer: Self-pay | Admitting: Nurse Practitioner

## 2024-09-24 ENCOUNTER — Ambulatory Visit: Attending: Nurse Practitioner | Admitting: Nurse Practitioner

## 2024-09-24 VITALS — BP 142/84 | HR 92 | Ht 62.0 in | Wt 318.0 lb

## 2024-09-24 DIAGNOSIS — I7776 Dissection of artery of upper extremity: Secondary | ICD-10-CM | POA: Diagnosis not present

## 2024-09-24 DIAGNOSIS — I5020 Unspecified systolic (congestive) heart failure: Secondary | ICD-10-CM | POA: Insufficient documentation

## 2024-09-24 DIAGNOSIS — E1165 Type 2 diabetes mellitus with hyperglycemia: Secondary | ICD-10-CM | POA: Insufficient documentation

## 2024-09-24 DIAGNOSIS — I2510 Atherosclerotic heart disease of native coronary artery without angina pectoris: Secondary | ICD-10-CM | POA: Diagnosis not present

## 2024-09-24 DIAGNOSIS — E785 Hyperlipidemia, unspecified: Secondary | ICD-10-CM | POA: Diagnosis not present

## 2024-09-24 DIAGNOSIS — I10 Essential (primary) hypertension: Secondary | ICD-10-CM | POA: Insufficient documentation

## 2024-09-24 DIAGNOSIS — N1832 Chronic kidney disease, stage 3b: Secondary | ICD-10-CM | POA: Insufficient documentation

## 2024-09-24 DIAGNOSIS — Z8673 Personal history of transient ischemic attack (TIA), and cerebral infarction without residual deficits: Secondary | ICD-10-CM | POA: Diagnosis not present

## 2024-09-24 NOTE — Patient Instructions (Signed)
 Medication Instructions:  Your physician recommends that you continue on your current medications as directed. Please refer to the Current Medication list given to you today.  *If you need a refill on your cardiac medications before your next appointment, please call your pharmacy*  Lab Work: Fasting lipid, CMET, CBC today  Testing/Procedures: NONE ordered at this time of appointment   Follow-Up: At [facility], you and your health needs are our priority.  As part of our continuing mission to provide you with exceptional heart care, our providers are all part of one team.  This team includes your primary Cardiologist (physician) and Advanced Practice Providers or APPs (Physician Assistants and Nurse Practitioners) who all work together to provide you with the care you need, when you need it.  Your next appointment:   6 month(s)  Provider:   [name] JAYSON Maxcy, MD    We recommend signing up for the patient portal called MyChart.  Sign up information is provided on this After Visit Summary.  MyChart is used to connect with patients for Virtual Visits (Telemedicine).  Patients are able to view lab/test results, encounter notes, upcoming appointments, etc.  Non-urgent messages can be sent to your provider as well.   To learn more about what you can do with MyChart, go to [email or web address].   Other Instructions

## 2024-09-24 NOTE — Progress Notes (Signed)
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 mild dyspnea with significant exertion, she has had 2 episodes of chest pain since December, relieved with nitroglycerin . Overall, her symptoms have been stable.    Home Medications    Current Outpatient Medications  Medication Sig Dispense Refill   aspirin  EC 81 MG tablet Take 81 mg by mouth daily. Swallow whole.     atorvastatin  (LIPITOR ) 40 MG tablet Take 1 tablet (40 mg total) by mouth daily. 90 tablet 3   Blood Glucose Monitoring Suppl (BLOOD GLUCOSE MONITOR SYSTEM) w/Device KIT Use in the morning, at noon, and at bedtime. 1 kit 0   carvedilol  (COREG ) 12.5 MG tablet Take 3 tablets (37.5 mg total) by mouth 2 (two) times daily with a meal. 180 tablet 3   clopidogrel  (PLAVIX ) 75 MG tablet Take 1 tablet (75 mg total)  by mouth daily. 90 tablet 3   Evolocumab  (REPATHA  SURECLICK) 140 MG/ML SOAJ Inject 140 mg into the skin every 14 (fourteen) days. 6 mL 3   furosemide  (LASIX ) 40 MG tablet Take 1 tablet (40 mg total) by mouth 2 (two) times daily. 180 tablet 3   glipiZIDE  (GLUCOTROL  XL) 5 MG 24 hr tablet Take 1 tablet (5 mg total) by mouth daily with  breakfast. 90 tablet 1   Glucose Blood (BLOOD GLUCOSE TEST STRIPS) STRP Use in the morning, at noon, and at bedtime. 100 each 3   insulin  isophane & regular human KwikPen (HUMULIN  70/30 KWIKPEN) (70-30) 100 UNIT/ML KwikPen Inject 15 Units into the skin 2 (two) times daily. 3 mL 5   Insulin  Pen Needle (BD PEN NEEDLE NANO 2ND GEN) 32G X 4 MM MISC USE TO INJECT INSULIN  UP TO 4 TIMES DAILY AS NEEDED 100 each 0   isosorbide  mononitrate (IMDUR ) 120 MG 24 hr tablet Take 1 tablet (120 mg total) by mouth daily. 90 tablet 3   Lancets MISC Use as directed. 100 each 0   nitroGLYCERIN  (NITROSTAT ) 0.4 MG SL tablet Place 0.4 mg under the tongue every 5 (five) minutes as needed for chest pain.     sacubitril -valsartan  (ENTRESTO ) 97-103 MG Take 1 tablet by mouth 2 (two) times daily. 180 tablet 3   spironolactone  (ALDACTONE ) 25 MG tablet Take 1 tablet (25 mg total) by mouth daily. (Patient taking differently: Take 25 mg by mouth every other day.) 90 tablet 3   valACYclovir  (VALTREX ) 500 MG tablet Take 1 tablet (500 mg total) by mouth 2 (two) times daily. (Patient taking differently: Take 500 mg by mouth 2 (two) times daily as needed (Breakout).) 180 tablet 1   dapagliflozin  propanediol (FARXIGA ) 5 MG TABS tablet Take 1 tablet (5 mg total) by mouth daily. (Patient not taking: Reported on 09/24/2024) 90 tablet 3   oxyCODONE  (ROXICODONE ) 5 MG immediate release tablet Take 1 tablet (5 mg total) by mouth every 4 (four) hours as needed for severe pain (pain score 7-10). (Patient not taking: Reported on 09/24/2024) 12 tablet 0   Semaglutide , 2 MG/DOSE, 8 MG/3ML SOPN Inject 2 mg as directed once a week. (Patient not taking: Reported on 09/24/2024) 9 mL 4   No current facility-administered medications for this visit.     Review of Systems    She denies chest pain, palpitations, dyspnea, pnd, orthopnea, n, v, dizziness, syncope, edema, weight gain, or early satiety. All other systems reviewed and are otherwise negative except  as noted above.     Cardiac Rehabilitation Eligibility Assessment  The patient is ready to start cardiac rehabilitation from a cardiac standpoint.    Physical Exam    VS:  BP (!) 142/84 (Cuff Size: Large)   Pulse 92   Ht 5' 2 (1.575 m)   Wt (!) 318 lb (144.2 kg)   LMP 09/01/2024   SpO2 98%   BMI 58.16 kg/m   GEN: Well nourished, well developed, in no acute distress. HEENT: normal. Neck: Supple, no JVD, carotid bruits, or masses. Cardiac: RRR, no murmurs, rubs, or gallops. No clubbing, cyanosis, edema.  Radials/DP/PT 2+ and equal bilaterally.  Respiratory:  Respirations regular and unlabored, clear to auscultation bilaterally. GI: Soft, nontender, nondistended, BS + x 4. MS: no deformity or atrophy. Skin: warm and dry, no rash. Neuro:  Strength and sensation are intact. Psych: Normal affect.  Accessory Clinical Findings    ECG personally reviewed by me today -    -  no EKG in office today.    Lab Results  Component Value Date   WBC 9.2 05/08/2024   HGB 10.2 (L) 06/19/2024   HCT 30.0 (L) 06/19/2024   MCV 91 05/08/2024   PLT 363 05/08/2024   Lab Results  Component Value Date   CREATININE 1.40 (H) 06/19/2024   BUN 22 (H) 06/19/2024   NA 140 06/19/2024   K 3.7 06/19/2024   CL 104 06/19/2024   CO2 22 05/08/2024   Lab Results  Component Value Date   ALT 9 05/08/2024   AST 13 05/08/2024   ALKPHOS 121 05/08/2024   BILITOT 0.3 05/08/2024   Lab Results  Component Value Date   CHOL 66 (L) 05/08/2024   HDL 33 (L) 05/08/2024   LDLCALC 14 05/08/2024   TRIG 94 05/08/2024   CHOLHDL 2.0 05/08/2024    Lab Results  Component Value Date   HGBA1C 8.0 (A) 07/02/2024    Assessment & Plan   1. CAD: CAD s/p NSTEMI, DESx 4 (p-mLAD, Ramus, pRCA) in 2019. Myoview  in January 2021 showed EF 36%, intermediate risk study with apical scar and moderate anterior apical ischemia with this scarring of anterior septum. Medical management was advised.  She was hospitalized in March  2025 in the setting of NSTEMI. Echo showed EF 45%, mildly decreased LV function, G1 DD, elevated left atrial pressure, normal RV systolic function, mild mitral valve regurgitation, no significant change from prior echo Ischemic evaluation was deferred.  She was hospitalized again in June 2025 in the setting of chest pain.  Cardiac catheterization showed severe multivessel CAD, widely patent LAD and RCA stents, focal stenosis in the ramus branch as well as the mid LAD PCI was deferred, she was referred to CT surgery who felt she was a poor candidate for surgery in the setting of morbid obesity, uncontrolled diabetes. She underwent staged PCI, DES-mid LAD, PTCA-RI (ISR). It was noted that should she have persistent angina, she may be considered a candidate for CTO-PCI-RCA (this would need to be reviewed by Dr. Jordan). She continues to note symptoms of intermittent chest discomfort both at rest and with activity, as well as mild dyspnea on exertion.  Symptoms have been stable overall.  She has used nitroglycerin  only twice since December 2025.  She will notify us  should she have progressive symptoms.  Reviewed ED precautions.  Will update CMET, CBC today.  Continue aspirin , Plavix , carvedilol , Entresto , spironolactone , Lasix , Imdur , Lipitor , and Repatha .   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  S/p repair of right radial artery dissection, right radial thrombectomy, patch angioplasty of the right radial artery using radial vein by Dr. Gretta on 06/19/2024.  She was referred to occupational therapy.  Follow-up with vascular surgery was recommended as needed.     3.  Heart failure with improved EF: Diagnosed in 2019 in the setting of NSTEMI, EF 35 to 40% at the time. Echo in 11/2023 showed EF 45%, mildly decreased LV function, G1 DD, elevated left atrial pressure, normal RV systolic function, mild mitral valve regurgitation, no significant change from prior echo.  Limited echo in 02/2024 showed EF  50 to 55%, low normal LV function, no RWMA, normal RV systolic function, no significant valvular abnormalities. Euvolemic and well compensated on exam.  She did not tolerate Jardiance  due to dizziness. Continue carvedilol , Entresto , spironolactone , and Lasix .   4. Hypertension/orthostatic dizziness: BP mildly elevated in office today, she has not yet taken her medication this morning.  Generally well-controlled.  Continue current  antihypertensive regimen.  5. Hyperlipidemia: LDL was 14 in 05/2024.  Will update fasting lipids today.  Continue Repatha , Lipitor .  6. H/o CVA: Hospitalized 08/2021 in the setting of CVA. Bubble study was negative for PFO. TEE without cardiac source of embolism.  30-day event monitor showed normal sinus rhythm, no signs of atrial fibrillation or other arrhythmia.  Followed by neurology.  Continue aspirin , Plavix , Lipitor , Repatha .  7. Type 2 diabetes/obesity: A1c was 8.0 in 06/2024.  Following with endocrinology.   8.  CKD stage IIIb: Creatinine was stable at 1.4 in 10/25.  Will update CMET today.  9. Possible OSA: Sleep study was negative for sleep apnea.    10. Disposition: Follow-up in 6 months, sooner if needed.  [name] JAYSON Braver, NP 09/24/2024, 12:16 PM       [1]  Allergies Allergen Reactions   Jardiance  [Empagliflozin ]     Dizziness even with 10 mg daily   Metformin  And Related Diarrhea   "

## 2024-09-25 ENCOUNTER — Ambulatory Visit

## 2024-09-25 DIAGNOSIS — M6281 Muscle weakness (generalized): Secondary | ICD-10-CM | POA: Diagnosis not present

## 2024-09-25 DIAGNOSIS — L905 Scar conditions and fibrosis of skin: Secondary | ICD-10-CM

## 2024-09-25 DIAGNOSIS — M79641 Pain in right hand: Secondary | ICD-10-CM

## 2024-09-25 DIAGNOSIS — R278 Other lack of coordination: Secondary | ICD-10-CM

## 2024-09-25 DIAGNOSIS — R208 Other disturbances of skin sensation: Secondary | ICD-10-CM

## 2024-09-25 DIAGNOSIS — R29898 Other symptoms and signs involving the musculoskeletal system: Secondary | ICD-10-CM

## 2024-09-25 DIAGNOSIS — M25531 Pain in right wrist: Secondary | ICD-10-CM

## 2024-09-25 NOTE — Patient Instructions (Addendum)
 Re/desensitization Techniques Some ways to Re/desensitize a hyper or hyposensitive ie) numb, tingling limb/area is by rubbing it with different textures. This will make your limb more tolerant/aware of touch and pressure. Before you begin, make sure your hands and the materials you're using are clean.  To rub your painful/sensitive area with different textures: Sit in a comfortable position with the numb/painful/sensitive area uncovered. Start with a material that is soft, such as a cotton ball, silk or soft towel. Rub your painful/sensitive area in all directions. Start with a light pressure and gradually increase the pressure. Vary the textures you use, as you can tolerate them. Start with soft materials like cotton balls or a makeup brush. Progress to materials that are rougher. Examples include a paper towel, cloth towel, wool, or velcro. As you progress, gradually increase the pressure and roughness of the texture you use. Be careful not to rub over any incisions or wounds, if you still have staples or sutures in place, or if there are any open areas. Rub your limb for at least 30 seconds progressing up to a minute or two as often as you can tolerate, or as recommended by your healthcare provider. Be careful not to irritate your skin or rub your skin raw.  Stop rubbing your affected area if you notice redness that does not dissipate in 15-30 minutes, bleeding or opening skin, and contact your healthcare provider.  Other methods for re/desensitization include: Allow cool or warm water  to run over the area.  Be careful not to get the water  too hot, especially if you have decreases sensation of temperature. Putting your affected area into a bowl of dry rice, sand, kidney beans, cold water  or warm water . You can hide objects in the dry materials to find. Make a bag of miscellaneous matching objects to feel and match based on feel ie) paper clips, nuts, bolts, dice, marbles, checkers, dominos, beads  etc.  If you choose to start with the method of dipping your affected area into a medium such as rice, sand, or water  make sure to move the affected area around in the bowl until you cannot tolerate it or you reach one to two minutes, whichever comes first. You can use a combination of these methods for 10 to 15 minutes, 3-4 times per day. Joint Protection Respect pain/Develop pain awareness Monitor activities - stop to rest when discomfort or fatigue develops Awareness of pain for activities prior to 12-24 hrs Pain lingering 2 hrs after activity is a warning sign - modify or eliminate Use larger joints and muscles Protect the delicate joints by using larger joints - use shoulder to carry bags not hands More proximal muscles and joints to perform ADLs (forearm/shoulder to open door-protect fingers) Groceries in paper bags Let the hip, elbow and arm support the grocery bag/push carts weight Push up from seat with palms of hands, not back of fingers Avoid tight/strong and prolonged grasp Build up handles (foam or cloth) on utensils, tools and pens Modify handles with levers Use AE for assistance for opening jars, pens and books, turning knobs and keys Change grasp to avoid static positioning Work with fingers extended over a large sponge rather than squeezing it Use open, flat palm to open jars, pump bottles for shampoo/soaps Avoid carrying, lifting and using heavy objects Use a cart to move heavy objects Push heavy objects with hips instead of pulling with hands Distribute weight over many joints - use 2 hands to lift an item Use light weight  tools and objects Use smaller containers of liquid or distribute liquids to smaller containers to lighten loads Balance rest and activity Plan rest breaks ahead of time Avoid activities that cannot be stopped if needed Energy conservation techniques (sitting when able, planning ahead, organizing workspace and storage for accessibility, keeping  most common used items within easy access, resting during activities, use timesavers like prepared foods) AE-Labor saving devices (modified cooking writing or cutting utensils, zipper pulls, button hooks, built up handle items, large grip pens, jar openers, key holder/turner, garden tools, and spring loaded scissors) Use splints as prescribes to protect joints
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
## 2024-10-05 ENCOUNTER — Ambulatory Visit: Admitting: Occupational Therapy

## 2024-10-06 ENCOUNTER — Ambulatory Visit: Admitting: Endocrinology

## 2024-10-09 ENCOUNTER — Ambulatory Visit

## 2024-10-13 ENCOUNTER — Ambulatory Visit

## 2024-10-16 ENCOUNTER — Ambulatory Visit

## 2024-10-19 ENCOUNTER — Ambulatory Visit

## 2024-10-23 ENCOUNTER — Ambulatory Visit: Admitting: Occupational Therapy

## 2024-11-23 ENCOUNTER — Ambulatory Visit: Payer: Self-pay

## 2025-01-14 ENCOUNTER — Ambulatory Visit: Admitting: Endocrinology
